# Patient Record
Sex: Male | Born: 1955
Health system: Southern US, Community
[De-identification: ages and names within clinical notes are randomized; demographics above are authoritative.]

## PROBLEM LIST (undated history)

## (undated) DIAGNOSIS — I10 Essential (primary) hypertension: Secondary | ICD-10-CM

## (undated) DIAGNOSIS — F329 Major depressive disorder, single episode, unspecified: Secondary | ICD-10-CM

## (undated) DIAGNOSIS — F419 Anxiety disorder, unspecified: Secondary | ICD-10-CM

## (undated) DIAGNOSIS — Z950 Presence of cardiac pacemaker: Secondary | ICD-10-CM

## (undated) DIAGNOSIS — G43909 Migraine, unspecified, not intractable, without status migrainosus: Secondary | ICD-10-CM

## (undated) DIAGNOSIS — F339 Major depressive disorder, recurrent, unspecified: Secondary | ICD-10-CM

## (undated) DIAGNOSIS — D1801 Hemangioma of skin and subcutaneous tissue: Secondary | ICD-10-CM

## (undated) DIAGNOSIS — G959 Disease of spinal cord, unspecified: Secondary | ICD-10-CM

## (undated) DIAGNOSIS — Z820 Family history of epilepsy and other diseases of the nervous system: Secondary | ICD-10-CM

## (undated) DIAGNOSIS — F32A Depression, unspecified: Secondary | ICD-10-CM

## (undated) DIAGNOSIS — E119 Type 2 diabetes mellitus without complications: Secondary | ICD-10-CM

## (undated) DIAGNOSIS — Z1211 Encounter for screening for malignant neoplasm of colon: Secondary | ICD-10-CM

## (undated) DIAGNOSIS — G894 Chronic pain syndrome: Secondary | ICD-10-CM

## (undated) DIAGNOSIS — M1990 Unspecified osteoarthritis, unspecified site: Secondary | ICD-10-CM

## (undated) DIAGNOSIS — N400 Enlarged prostate without lower urinary tract symptoms: Secondary | ICD-10-CM

## (undated) DIAGNOSIS — Z8669 Personal history of other diseases of the nervous system and sense organs: Secondary | ICD-10-CM

## (undated) DIAGNOSIS — D72829 Elevated white blood cell count, unspecified: Secondary | ICD-10-CM

## (undated) DIAGNOSIS — K219 Gastro-esophageal reflux disease without esophagitis: Secondary | ICD-10-CM

## (undated) DIAGNOSIS — E782 Mixed hyperlipidemia: Secondary | ICD-10-CM

## (undated) DIAGNOSIS — R531 Weakness: Secondary | ICD-10-CM

## (undated) DIAGNOSIS — Z419 Encounter for procedure for purposes other than remedying health state, unspecified: Secondary | ICD-10-CM

## (undated) DIAGNOSIS — Z87891 Personal history of nicotine dependence: Secondary | ICD-10-CM

## (undated) HISTORY — DX: Migraine, unspecified, not intractable, without status migrainosus: G43.909

## (undated) HISTORY — DX: Anxiety disorder, unspecified: F41.9

## (undated) HISTORY — DX: Encounter for procedure for purposes other than remedying health state, unspecified: Z41.9

## (undated) HISTORY — PX: BACK SURGERY: SHX140

## (undated) HISTORY — DX: Benign prostatic hyperplasia without lower urinary tract symptoms: N40.0

## (undated) HISTORY — DX: Personal history of other diseases of the nervous system and sense organs: Z86.69

## (undated) HISTORY — PX: HEMORRHOID SURGERY: SHX153

## (undated) HISTORY — DX: Chronic pain syndrome: G89.4

## (undated) HISTORY — DX: Major depressive disorder, single episode, unspecified: F32.9

## (undated) HISTORY — DX: Weakness: R53.1

## (undated) HISTORY — DX: Type 2 diabetes mellitus without complications: E11.9

## (undated) HISTORY — DX: Essential (primary) hypertension: I10

## (undated) HISTORY — PX: NASAL SINUS SURGERY: SHX719

## (undated) HISTORY — PX: HERNIA REPAIR: SHX51

## (undated) HISTORY — PX: FOOT NEUROMA SURGERY: SHX646

## (undated) HISTORY — DX: Depression, unspecified: F32.A

## (undated) HISTORY — DX: Encounter for screening for malignant neoplasm of colon: Z12.11

## (undated) HISTORY — DX: Elevated white blood cell count, unspecified: D72.829

## (undated) HISTORY — PX: NECK SURGERY: SHX720

## (undated) HISTORY — DX: Major depressive disorder, recurrent, unspecified: F33.9

## (undated) HISTORY — PX: GASTRIC BYPASS: SHX52

---
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
## 2006-11-15 ENCOUNTER — Encounter: Admission: RE | Admit: 2006-11-15 | Discharge: 2006-11-15 | Payer: Self-pay | Admitting: Internal Medicine

## 2007-01-24 ENCOUNTER — Ambulatory Visit (HOSPITAL_COMMUNITY): Admission: RE | Admit: 2007-01-24 | Discharge: 2007-01-25 | Payer: Self-pay | Admitting: Neurosurgery

## 2007-03-27 ENCOUNTER — Encounter: Admission: RE | Admit: 2007-03-27 | Discharge: 2007-03-27 | Payer: Self-pay | Admitting: Specialist

## 2007-04-28 ENCOUNTER — Encounter: Admission: RE | Admit: 2007-04-28 | Discharge: 2007-04-28 | Payer: Self-pay | Admitting: Neurosurgery

## 2007-08-24 ENCOUNTER — Inpatient Hospital Stay (HOSPITAL_COMMUNITY): Admission: RE | Admit: 2007-08-24 | Discharge: 2007-08-28 | Payer: Self-pay | Admitting: Specialist

## 2007-08-25 ENCOUNTER — Ambulatory Visit: Payer: Self-pay | Admitting: Physical Medicine & Rehabilitation

## 2010-04-23 ENCOUNTER — Ambulatory Visit (HOSPITAL_COMMUNITY): Admission: RE | Admit: 2010-04-23 | Discharge: 2010-04-23 | Payer: Self-pay | Admitting: General Surgery

## 2010-04-27 ENCOUNTER — Ambulatory Visit (HOSPITAL_COMMUNITY): Admission: RE | Admit: 2010-04-27 | Discharge: 2010-04-27 | Payer: Self-pay | Admitting: General Surgery

## 2010-06-15 ENCOUNTER — Encounter
Admission: RE | Admit: 2010-06-15 | Discharge: 2010-06-15 | Payer: Self-pay | Source: Home / Self Care | Attending: General Surgery | Admitting: General Surgery

## 2010-08-27 ENCOUNTER — Encounter
Admission: RE | Admit: 2010-08-27 | Discharge: 2010-10-06 | Payer: Self-pay | Source: Home / Self Care | Attending: General Surgery | Admitting: General Surgery

## 2010-09-06 HISTORY — PX: GASTRIC BYPASS: SHX52

## 2010-09-10 LAB — CBC
HCT: 42.9 % (ref 39.0–52.0)
Hemoglobin: 15 g/dL (ref 13.0–17.0)
MCH: 31.7 pg (ref 26.0–34.0)
MCHC: 35 g/dL (ref 30.0–36.0)
MCV: 90.7 fL (ref 78.0–100.0)
Platelets: 180 10*3/uL (ref 150–400)
RBC: 4.73 MIL/uL (ref 4.22–5.81)
RDW: 12.4 % (ref 11.5–15.5)
WBC: 5.8 10*3/uL (ref 4.0–10.5)

## 2010-09-10 LAB — COMPREHENSIVE METABOLIC PANEL
ALT: 47 U/L (ref 0–53)
AST: 38 U/L — ABNORMAL HIGH (ref 0–37)
Albumin: 4.3 g/dL (ref 3.5–5.2)
Alkaline Phosphatase: 55 U/L (ref 39–117)
BUN: 12 mg/dL (ref 6–23)
CO2: 25 mEq/L (ref 19–32)
Calcium: 9.7 mg/dL (ref 8.4–10.5)
Chloride: 107 mEq/L (ref 96–112)
Creatinine, Ser: 1.17 mg/dL (ref 0.4–1.5)
GFR calc Af Amer: >60 mL/min (ref >60)
GFR calc non Af Amer: >60 mL/min (ref >60)
Glucose, Bld: 112 mg/dL — ABNORMAL HIGH (ref 70–99)
Potassium: 3.7 mEq/L (ref 3.5–5.1)
Sodium: 141 mEq/L (ref 135–145)
Total Bilirubin: 0.7 mg/dL (ref 0.3–1.2)
Total Protein: 7.3 g/dL (ref 6.0–8.3)

## 2010-09-10 LAB — DIFFERENTIAL
Basophils Absolute: 0.1 10*3/uL (ref 0.0–0.1)
Basophils Relative: 1 % (ref 0–1)
Eosinophils Absolute: 0.2 10*3/uL (ref 0.0–0.7)
Eosinophils Relative: 3 % (ref 0–5)
Lymphocytes Relative: 41 % (ref 12–46)
Lymphs Abs: 2.4 10*3/uL (ref 0.7–4.0)
Monocytes Absolute: 0.4 10*3/uL (ref 0.1–1.0)
Monocytes Relative: 7 % (ref 3–12)
Neutro Abs: 2.7 10*3/uL (ref 1.7–7.7)
Neutrophils Relative %: 47 % (ref 43–77)

## 2010-09-14 ENCOUNTER — Inpatient Hospital Stay (HOSPITAL_COMMUNITY)
Admission: RE | Admit: 2010-09-14 | Discharge: 2010-09-17 | Payer: Self-pay | Source: Home / Self Care | Attending: General Surgery | Admitting: General Surgery

## 2010-09-15 ENCOUNTER — Encounter (INDEPENDENT_AMBULATORY_CARE_PROVIDER_SITE_OTHER): Payer: Self-pay | Admitting: General Surgery

## 2010-09-21 LAB — CBC
HCT: 41.4 % (ref 39.0–52.0)
HCT: 39.7 % (ref 39.0–52.0)
HCT: 37.5 % — ABNORMAL LOW (ref 39.0–52.0)
Hemoglobin: 14.2 g/dL (ref 13.0–17.0)
Hemoglobin: 14 g/dL (ref 13.0–17.0)
Hemoglobin: 12.7 g/dL — ABNORMAL LOW (ref 13.0–17.0)
MCH: 31.6 pg (ref 26.0–34.0)
MCH: 32.1 pg (ref 26.0–34.0)
MCH: 30.8 pg (ref 26.0–34.0)
MCHC: 34.3 g/dL (ref 30.0–36.0)
MCHC: 35.3 g/dL (ref 30.0–36.0)
MCHC: 33.9 g/dL (ref 30.0–36.0)
MCV: 92 fL (ref 78.0–100.0)
MCV: 91.1 fL (ref 78.0–100.0)
MCV: 91 fL (ref 78.0–100.0)
Platelets: 143 10*3/uL — ABNORMAL LOW (ref 150–400)
Platelets: 135 10*3/uL — ABNORMAL LOW (ref 150–400)
Platelets: 140 10*3/uL — ABNORMAL LOW (ref 150–400)
RBC: 4.5 MIL/uL (ref 4.22–5.81)
RBC: 4.36 MIL/uL (ref 4.22–5.81)
RBC: 4.12 MIL/uL — ABNORMAL LOW (ref 4.22–5.81)
RDW: 12.3 % (ref 11.5–15.5)
RDW: 12.2 % (ref 11.5–15.5)
RDW: 12.3 % (ref 11.5–15.5)
WBC: 9.8 10*3/uL (ref 4.0–10.5)
WBC: 8.7 10*3/uL (ref 4.0–10.5)
WBC: 7 10*3/uL (ref 4.0–10.5)

## 2010-09-21 LAB — DIFFERENTIAL
Basophils Absolute: 0 10*3/uL (ref 0.0–0.1)
Basophils Absolute: 0 10*3/uL (ref 0.0–0.1)
Basophils Relative: 0 % (ref 0–1)
Basophils Relative: 0 % (ref 0–1)
Eosinophils Absolute: 0 10*3/uL (ref 0.0–0.7)
Eosinophils Absolute: 0.1 10*3/uL (ref 0.0–0.7)
Eosinophils Relative: 0 % (ref 0–5)
Eosinophils Relative: 1 % (ref 0–5)
Lymphocytes Relative: 17 % (ref 12–46)
Lymphocytes Relative: 24 % (ref 12–46)
Lymphs Abs: 1.7 10*3/uL (ref 0.7–4.0)
Lymphs Abs: 2.1 10*3/uL (ref 0.7–4.0)
Monocytes Absolute: 0.9 10*3/uL (ref 0.1–1.0)
Monocytes Absolute: 0.9 10*3/uL (ref 0.1–1.0)
Monocytes Relative: 10 % (ref 3–12)
Monocytes Relative: 11 % (ref 3–12)
Neutro Abs: 7.1 10*3/uL (ref 1.7–7.7)
Neutro Abs: 5.6 10*3/uL (ref 1.7–7.7)
Neutrophils Relative %: 73 % (ref 43–77)
Neutrophils Relative %: 64 % (ref 43–77)

## 2010-09-21 LAB — GLUCOSE, CAPILLARY
Glucose-Capillary: 101 mg/dL — ABNORMAL HIGH (ref 70–99)
Glucose-Capillary: 127 mg/dL — ABNORMAL HIGH (ref 70–99)
Glucose-Capillary: 137 mg/dL — ABNORMAL HIGH (ref 70–99)
Glucose-Capillary: 138 mg/dL — ABNORMAL HIGH (ref 70–99)
Glucose-Capillary: 148 mg/dL — ABNORMAL HIGH (ref 70–99)
Glucose-Capillary: 158 mg/dL — ABNORMAL HIGH (ref 70–99)
Glucose-Capillary: 200 mg/dL — ABNORMAL HIGH (ref 70–99)
Glucose-Capillary: 211 mg/dL — ABNORMAL HIGH (ref 70–99)
Glucose-Capillary: 238 mg/dL — ABNORMAL HIGH (ref 70–99)
Glucose-Capillary: 129 mg/dL — ABNORMAL HIGH (ref 70–99)
Glucose-Capillary: 133 mg/dL — ABNORMAL HIGH (ref 70–99)
Glucose-Capillary: 133 mg/dL — ABNORMAL HIGH (ref 70–99)
Glucose-Capillary: 144 mg/dL — ABNORMAL HIGH (ref 70–99)
Glucose-Capillary: 144 mg/dL — ABNORMAL HIGH (ref 70–99)
Glucose-Capillary: 146 mg/dL — ABNORMAL HIGH (ref 70–99)
Glucose-Capillary: 156 mg/dL — ABNORMAL HIGH (ref 70–99)
Glucose-Capillary: 133 mg/dL — ABNORMAL HIGH (ref 70–99)
Glucose-Capillary: 136 mg/dL — ABNORMAL HIGH (ref 70–99)
Glucose-Capillary: 143 mg/dL — ABNORMAL HIGH (ref 70–99)
Glucose-Capillary: 159 mg/dL — ABNORMAL HIGH (ref 70–99)
Glucose-Capillary: 165 mg/dL — ABNORMAL HIGH (ref 70–99)

## 2010-09-21 LAB — HEMOGLOBIN AND HEMATOCRIT, BLOOD
HCT: 39.9 % (ref 39.0–52.0)
HCT: 43.6 % (ref 39.0–52.0)
Hemoglobin: 14 g/dL (ref 13.0–17.0)
Hemoglobin: 15.1 g/dL (ref 13.0–17.0)

## 2010-09-28 ENCOUNTER — Encounter
Admission: RE | Admit: 2010-09-28 | Discharge: 2010-10-06 | Payer: Self-pay | Source: Home / Self Care | Attending: General Surgery | Admitting: General Surgery

## 2010-09-29 NOTE — Discharge Summary (Signed)
  NAME:  Antonio Oliver, Antonio Oliver               ACCOUNT NO.:  [id number]  MEDICAL RECORD NO.:  [id number]          PATIENT TYPE:  INP  LOCATION:  1525                         FACILITY:  [facility]  PHYSICIAN:  Sharlet Salina T. Lenin Kuhnle, M.D.DATE OF BIRTH:  [birth year]  DATE OF ADMISSION:  09/14/2010 DATE OF DISCHARGE:  09/17/2010                              DISCHARGE SUMMARY   DISCHARGE DIAGNOSES: 1. Morbid obesity. 2. Insulin-dependent diabetes mellitus. 3. Hypertension. 4. Obstructive sleep apnea. 5. Hypothyroidism. 6. Depression. 7. Umbilical hernia.  OPERATIONS AND PROCEDURES:  Laparoscopic Roux-en-Y gastric bypass with repair of an umbilical hernia on September 14, 2010.  SURGEON:  Lorne Skeens. Shriyan Arakawa, M.D.  HISTORY OF PRESENT ILLNESS:  The patient is a 55 year old male with longstanding progressive morbid obesity unresponsive to medical management.  He presented with a BMI of 35.5 with multiple comorbidities including insulin-dependent diabetes mellitus, hypertension, sleep apnea, elevated cholesterol, chronic arthritis and GERD.  The patient has undergone extensive preoperative evaluation as an outpatient and is admitted electively for laparoscopic Roux-en-Y gastric bypass.  He also has a moderate-sized symptomatic umbilical hernia which we repaired at the same time.  PAST SURGICAL HISTORY:  The patient has had previous surgery including inguinal hernia repair in 1980, multiple back surgeries with fusion and surgical disk surgery.  PAST MEDICAL HISTORY:  Medically followed for insulin-dependent diabetes mellitus, history of cardiac spasms without MI or coronary artery disease, hypertension, hypothyroidism, severe depression with numerous ECT sessions.  He has sleep apnea requiring CPAP, hypercholesterolemia, GERD, insomnia, BPH.  MEDICATIONS:  The patient is on NPH and regular insulin.  He is on 37 medications for illnesses above.  Please see separate  medication reconciliation.  ALLERGIES:  NO KNOWN DRUG ALLERGIES.  PHYSICAL EXAMINATION:  He is obese.  The abdomen is nontender.  There is a several-centimeter umbilical hernia.  HOSPITAL COURSE:  The patient was admitted on the morning of his procedure and underwent an uneventful laparoscopic Roux-en-Y gastric bypass and repair of his umbilical hernia primarily without mesh.  His postoperative course was smooth.  Upper GI series showed no leak or obstruction on the first postoperative day.  His white count and hemoglobin were normal.  He was begun on a liquid diet and able to be advanced to protein shakes without difficulty.  CBC remained normal on the second postoperative day.  He did, however, have one spike of fever to 102.5 on the second postoperative morning.  Exam was unremarkable and this was observed and felt secondary to atelectasis.  He remained afebrile following this and continued to feel well and was tolerating his diet.  He was discharged home on the third postoperative day. Wounds healing primarily.  Abdomen nontender.  DISCHARGE MEDICATIONS:  Discharge medications are the same as admission with markedly reduced insulin requirements.  FOLLOWUP:  Followup will be my office in one week.     Lorne Skeens. Hayward Rylander, M.D.     Tory Emerald  D:  09/25/2010  T:  09/25/2010  Job:  [id number]  Electronically Signed by Glenna Fellows M.D. on 09/29/2010 09:22:29 AM

## 2010-10-01 LAB — SURGICAL PCR SCREEN: Staphylococcus aureus: POSITIVE — AB

## 2010-11-05 ENCOUNTER — Encounter: Payer: Medicare Other | Attending: General Surgery | Admitting: *Deleted

## 2010-11-05 DIAGNOSIS — Z713 Dietary counseling and surveillance: Secondary | ICD-10-CM | POA: Insufficient documentation

## 2010-11-05 DIAGNOSIS — E669 Obesity, unspecified: Secondary | ICD-10-CM | POA: Insufficient documentation

## 2011-01-19 NOTE — Op Note (Signed)
NAME:  Antonio Oliver, Antonio Oliver NO.:  [id number]   MEDICAL RECORD NO.:  [id number]          PATIENT TYPE:  INP   LOCATION:  2899                         FACILITY:  [facility]   PHYSICIAN:  Kathaleen Maser. Pool, M.D.    DATE OF BIRTH:  1955-09-13   DATE OF PROCEDURE:  01/24/2007  DATE OF DISCHARGE:                               OPERATIVE REPORT   PREOPERATIVE DIAGNOSIS:  Right L4-5 herniated pulposus with  radiculopathy.   POSTOPERATIVE DIAGNOSIS:  Right L4-5 herniated pulposus with  radiculopathy.   PROCEDURE NAME:  Right L4-5 laminotomy and microdiskectomy.   SURGEON:  Kathaleen Maser. Pool, M.D.   ASSISTANT:  Reinaldo Meeker, M.D.   ANESTHESIA:  General orotracheal anesthesia.   INDICATIONS:  Mr. Kiss is a 55 year old male who has a remote history  of a left-sided L4-5 laminotomy and diskectomy, presents now with severe  back and right lower extremity pain, paresthesias and weakness of the  right-sided L5 radiculopathy.  Workup demonstrates evidence of a right  sided L4-5 disk herniation with compression of thecal sac and right side  L5 nerve root.  The patient counseled as to options.  He decided proceed  with a right-sided L4-5 laminotomy microdiskectomy in hopes of  improving his symptoms.   OPERATIVE NOTE:  Patient to operating room, placed on table in the  supine position.  After adequate anesthesia achieved the patient prone  onto Wilson frame, appropriately padded.  The patient lumbar region was  prepped draped sterilely.  10 blade made linear incision overlying the  L4-5 interspace.  This carried down sharply in the midline.  Subperiosteal dissection performed exposing lamina facet joints L4 and  L5 on the right side.  Deep self-retaining retractors placed.  Intraoperative x-rays taken level was confirmed.  Laminotomy was then  performed using high-speed drill and Kerrison rongeurs remove inferior  aspect lamina of L4, medial aspect of L4-5 facet joint, superior rim  of  L5 lamina.  Ligament flavum was then elevated and resected piecemeal  fashion using Kerrison rongeurs.  Underlying thecal sac and exiting L5  nerve were identified.  Microscope was then brought to field, used for  microdissection of the right-sided L5 nerve root underlying disk.  Epidural venous plexus coagulated cut.  Thecal sac and L5 nerve were  mobilized retracted towards midline.  Disk herniation was then  identified.  This was incised 15 blade in rectangular fashion.  A wide  disk space clean-out was then achieved using pituitary rongeurs, upward  angled rongeurs and Epstein curettes.  All elements the disk herniation  completely resected.  All loose or obviously degenerative disk material  was removed from the interspace.  After very thorough diskectomy was  achieved, the canal was inspected.  This found to be free of any  residual compressive masses.  Wound was then inspected one final time.  There is no evidence of injury to thecal sac and nerve roots.  We then  irrigated with antibiotic solution.  Gelfoam was placed topically for  hemostasis, found to be good.  Microscope and retractor system were  removed.  Hemostasis of muscles  achieved electrocautery.  Wound was closed in  layers Vicryl sutures.  Steri-Strips  sterile dressings were applied.  No complications.  The patient tolerated procedure well and he returns  recovery room postoperative.           ______________________________  Kathaleen Maser. Pool, M.D.     HAP/MEDQ  D:  01/24/2007  T:  01/24/2007  Job:  [id number]

## 2011-01-22 NOTE — Op Note (Signed)
NAMECOLTON, TASSIN               ACCOUNT NO.:  [id number]   MEDICAL RECORD NO.:  [id number]          PATIENT TYPE:  INP   LOCATION:  5016                         FACILITY:  [facility]   PHYSICIAN:  Kerrin Champagne, M.D.   DATE OF BIRTH:  05-21-1956   DATE OF PROCEDURE:  08/29/2007  DATE OF DISCHARGE:  08/28/2007                               OPERATIVE REPORT   PREOPERATIVE DIAGNOSIS:  Right foraminal entrapment L4-5 and L5-S1 with  severe degenerative disk disease L4-L5 and L5-S1 status post previous  microdiskectomy at the L4-5 and L5-S1 levels with recurring nerve root  entrapment, small disk protrusion.  Modic changes seen on MRI study.   POSTOPERATIVE DIAGNOSIS:  Right foraminal entrapment L4-5 and L5-S1 with  severe degenerative disk disease L4-L5 and L5-S1 status post previous  microdiskectomy at the L4-5 and L5-S1 levels with recurring nerve root  entrapment, small disk protrusion.  Modic changes seen on MRI study.  Severe foraminal entrapment affecting the right L4 and right L5 nerve  roots.  L5 in particular entrapped by the inferior articular process of  L5 above the disk space L5-S1.   PROCEDURE:  Right-sided L4-5 and L5-S1 transforaminal lumbar interbody  fusion, right-sided L5 hemilaminectomy for decompression of the L5 nerve  root within the entry point to the L5 neural foramen and within the  neural foramen itself.  Right-sided transforaminal lumbar interbody  fusion was performed using DePuy Monarch Concorde cages 8 mm at the L4-5  level lordotic and 9 mm at the L5-S1 level lordotic.  Combination of  local bone graft within the T-lift site and posterior lateral fusion L4-  5 and L5-S1 using combination of V-toss and local bone graft.  The bone  marrow aspirate via the pedicles on the left side at L4 and L5.  For  application of V-Toss.  Pedicle screw and rod fixation L3-4 and L4-5  total three segments to intervertebral disk spaces.  Using Monarch  pedicle screws and  rods 6.25 mm screws at L4-L5, 7-mm screws at the S1  level.   SURGEON:  Dr. Vira Browns.   ASSISTANCE:  Maud Deed, PAC.   SURGEON:  Burna Forts, M.D.   ANESTHESIA:  General via oral tracheal intubation and local infiltration  of Marcaine 0.50%, 1:200,000 epinephrine 20 mL.   The patient received intraoperative neuro monitoring, received Cell  Saver blood total 600 mL.   ESTIMATED BLOOD LOSS:  Was 1100 mL for the case.   HISTORY OF PRESENT ILLNESS:  The patient is a 55 year old male who  developed severe back pain, radiation to his right leg nearly a year and  a half ago.  It was evaluated and felt to have findings consistent with  degenerative disk disease of disk protrusion and severe foraminal  entrapment right side at L4-5 and L5-S1 due to congenital spinal  stenosis and degenerative disk changes.  He underwent surgical treatment  in the form of microdiskectomy on the right side at L4-5, L5-S1.  However, he persisted with severe pain with neurogenic claudication,  difficulty with standing and ambulation.  He also noted pain with  bending or stooping, or lifting.  Severe leg pain at the L5 distribution  as well as L4.  The patient underwent attempts at conservative  management including ESI which were unsuccessful.  He is in pain  management, unable to return to work following his surgical procedures.  Intraoperative findings as above.   DESCRIPTION OF PROCEDURE:  After adequate general anesthesia the patient  had Foley catheter placed.  Standard preoperative antibiotics of Ancef.  He was rolled to a prone position.  The [facility] spine table was used, a  standard prep with DuraPrep solution.  All pressure points well-padded.  Intraoperative neuro monitoring was used.  The incision ellipse in the  old incision scar in the midline extending from about L2-S2.  Incision  carried through skin and subcu layers down to the spinous process to L3,  L4-L5, S1-S2.  Incision  carried along the lateral aspect spinous process  of L2-L3, L4-L5, S1-S2 and carried out laterally exposing the sacrum.  The lumbosacral junction bilaterally, excising dissect capsules at the  lumbosacral junction, continuing to the L4-5 level.  Clamps placed on  the spinous processes of L4 and L5 and intraoperative radiograph  demonstrated these to be L4 and L5.  These were marked for continued  identification.  Facet capsule was also identified at the L3-4 level.  This was preserved.  Dissection carried out laterally exposing the  transverse process of L4 at the L3-4 level bilaterally.  The incision  was carried out to the lateral gutters extending up to L2-3 further to  allow for lateral exposure enough to allow for pedicle screw placement  at the L4-L5 level and S1 levels with correct degree of convergence.  Transverse process at L4, L5-S1 sacral ala were completely exposed  bilaterally then packed with sponges after obtaining hemostasis with  bipolar electrocautery as well as regular monopolar electrocautery.   Attention then turned to performance of facetectomies right-sided L4-5,  L5-S1.  Osteotomes were used to resect the inferior articular process of  L4 on the right side of the L4-5 level.  The inferior articular process  of L5, L5-S1 level was provided excellent bone graft and abundant amount  of bone graft material.  Superior articular process of S1 was resected  as well using osteotomes as well as at the L5 levels superior articular  process was resected.  This decompressed the neural foramen.  The L4-5,  L5-S1 the ligament of flavum was resected and the foraminotomy performed  of the S1 nerve root lateral recess decompressed using osteotome then 3  mm and 4 mm Kerrisons used to decompressed the lateral recess, removed  bone overhang of the superior aspect of the S1 pedicle, further  decompressing the neural foramen on the right side, obtaining exposure  of the right L5-S1  disk space.  At the L4-5 level, similarly, the L5  superior reticular process resected using osteotome and 3-mm Kerrisons.  Reflected portions of the ligamentum flavum were resected completely  decompressing the neural foramen of the L4 nerve root and of the  overlying reflected ligament flavum was resected out laterally.  Hockey  stick neural probe could be passed out over the L4 neural foramen  without difficulty demonstrating its patency.  All residual pars was  resected so that the entry point was completely well decompressed.  Hockey stick neural probe could be passed out up the lateral recess to  the L3-4 level without difficulty and out the L3-L4 neural foramen  demonstrating its patency as well.  Attention then turned to performance of pedicle screw fixation, first on  the side entry point for the L4 pedicle was made using an awl over the  lateral aspect of the pedicle of L4 at the intersection of the  transverse process of L4 with superior articular process of L4 and the  lateral portion of the pedicle.  After making initial entry point a  straight pedicle finder was used to probe the pedicle ball-tipped probe  used to check the pedicle opening to make sure that there was no  broaching and cortex was present.  C-arm fluoro was used to carefully  evaluate the position alignment for the awl placement as well as pedicle  probing.  Screws measuring about 45 mm in length then by 6.25 were then  placed on the left side at the L4 level.  Note that aspiration was first  carried out using a blunt tipped aspiration equipment through the  opening in the pedicle left L4 aspirating 10 mL.  This was applied to  the talus.   Next, tapping was performed in the left-side of L4 and the appropriate  screw then placed.  Then at the L5 level similarly an awl was used to  make an entry point of the lateral aspect of pedicle L5, correct degree  of convergence and a straight pedicle finder used to  probe the pedicle  of L5.  Ball-tipped probe used to ensure patency of the pedicle opening  and no further sign of broaching of pedicle was present.  Tapping  performed using a 5.5 tap and 6.25 x 45 mm screw was placed in the L5  level on the left side following decortication of the transverse process  at this level as well as L4 and application of V-Toss bone graft.   Attention then turned to the S1 level on the left side where again an  opening was made.  This was distal lateral to the superior articular  process of S1 at its inferior portion and directed angle and correct  degree of convergence and lordosis.  Straight pedicle finder used to  probe the pedicle and then a 40 mm x 7.0 screw was used at this point to  correct a degree of convergence after tapping with a 6.25 tap  decorticating the lateral ala placing V-Toss bone graft, then the  pedicle screw was placed at the S1 level on the left side.   Attention then turned to the right side where pedicle screws were  similarly placed on the right side, again using an awl for entry point  at the L4 level, the intersection of transverse process of L4 at the  lateral aspect of pedicle of L4 and screw articular process.  The L4  observing on C-arm fluoro with correct position and alignment.  Then  using a straight pedicle finder and probe the pedicle to approximately  45 mm by checking the entry point as well as the opening of the pedicle  to ensure its patency and no sign of broaching of cortex.  Tapping was a  5.5 tap, a 6.25 x 45 mm screw was placed on the right side without  difficulty.  Decortication was carried out over the transverse process  and local bone graft with V-Toss applied.   Attention turned to the L5 pedicle where again awl was used to make an  initial entry point at the intersection of the transverse process of L5  with the lateral aspect of the pedicle of L5.  Using a straight pedicle  finder then the pedicle was  probed.  The patency of the pedicle was then  tested using ball-tipped probe to ensure no sign of broaching cortex.  None was present.  Tapping was performed using a 5.5 tap and a 45-mm x  6.25 screw was then placed after decortication of the transverse  process, application of V-Toss and local bone graft.   Finally, on the right side of S1, initial entry point was made using the  awl at the lower aspect of the lateral aspect of the facet of S1 and  again a straight pedicle finder used to probe the pedicle to a depth of  40 mm.  A 7.0 x 40-mm screw was chosen, tapping with the 6.25 tap,  decorticating the sacral ala laterally applying bone graft out laterally  and screw was applied without difficulty.   Intraoperative C-arm fluoro demonstrated all six screws to be in good  position and alignment.  Pedicle screw testing was carried out.  Each of  the screws tested better than 30 at all levels with the exception of L5  on the left.  Observing this pedicle screw on AP and lateral views it  was felt to be in excellent position and alignment with no sign of  broaching of cortex.  Therefore, no attempt was made to remove the  screws.   With this and T-lift was then begun on the right side versus the L4-5  level, retracting the thecal sac at this level and the L4 nerve root  superiorly.  The entry point the distal posterior lateral corner was  then observed.  Bipolar electrocautery used to control the veins here  and epidural vein bleeding.  The disk space entered using a 15 blade  scalpel, removing a window of disk material.  Kerrisons used to further  debride the annular material opening this area.  Dilatation of the disk  space carried out with a 7 mm, then 8 mm then 9 mm spreader.  This was  completed then the space was debrided using regular tooth pituitary  rongeurs, straight and upbiting, debriding the degenerative disk  material.   The DePuy pituitaries were then used at this point.   Curettage carried  out of the disk space removing any residual degenerative disk material  as well as cartilaginous endplates over the inferior aspect of L4  superior aspect of L5.  Both the straight and upbiting right upbiting  left curettes were used and ring curettes down to bleeding subchondral  bone surfaces.   With this completed then trial with the trial insert was performed first  with a 7 mm then an 8 mm.  The 8 mm provided tight fit and it was felt  that this was the only particular size that would be able to be inserted  at this level due to degeneration loss and disk space height.  This  being the case an 8-mm cage was then packed with local bone graft  harvested from the bone removed from the pedicles and the facets on the  right side during their removal of the facets.  This was morselized and  place within the cage.  Additional bone graft was placed within the  intervertebral disk space and finally the 8-mm cage and the trajectory  of about 35 degrees of convergence was impacted in the disk space and  provided excellent distraction and excellent fixation of the disk space.  Care was taken to ensure that no bone  fragments were retropulsed into  the spinal canal.  The L4 neural foramen remained widely patent as did  the lateral recess at L4-5.  Entry points at the L5 neural foramen.   Attention then turned to the L5-S1 level on the right side where the  disk space was then similarly exposed, retracted thecal sac medially.  The S1 nerve root and the L5 nerve root superiorly disk space and  entered using an 15 blade scalpel to excise disk material and make a  window into the disk space on this side.  Pituitary rongeurs used to  debride the disk material and then DePuy straight upbitings were used to  further debride disk material from the disk space.   Curettage then carried out over the endplates as well as removing  further degenerative disk material curetting  cartilaginous endplates  down to bleeding bone surfaces.  Straight upbiting right upbiting left  curettes were used then ring curettes, straight and upbiting.  With this  completed then the endplates appeared to be primarily bleeding bone.  No  sign of residual cartilage, end plates or residual degenerative disk  here.  Dilatation of the disk space was carried out 8, 9 and 10 mm.  A 9-mm  cage was chosen lordotic cage.  This was done after trialing with a 7,  8, and then 9 mm.  The 9 mm provided excellent fit to a 9-mm lordotic  cage chosen.  This 9 mL cage was packed with local bone graft then  harvested from previous facet resection on the right side.   Additional bone graft was placed within the intervertebral disk space  with the opening on the right side and then the cage with the correct  trajectory was impacted into place with protectery about 35 degrees  angulation and convergence.  Subset well beneath the posterior aspect of  the disk space at L5-S1 as was the L4-5 cage.  Care was taken to ensure  that these were seated centrally and anteriorly within the disk space  and with correct degree of convergence with lordosis of both levels.   This completed the T-lift procedure.  Careful exploration of the neural  foramen at the L5 level demonstrated persistence of bone superior to the  disk space and appeared to cause compression of the L5 neural foramen.  This being the case the patient's neural foramen was further  decompressed by first performing resection of the pars and the residual  portion of the laminectomy of lamina of L5 on the right side was  continued up to the L4-5 level, performing a formal hemilaminectomy.  Then finding L5 nerve root within the lateral recess at L4-5 and then  tracking it out to the neural foramen within the entry point to the  neural foramen, decompressing and removing the residual lamina and pars  overlying the L5 nerve root until the L5 nerve root  was completely  decompressed exiting out the L5 neural foramen above the disk space.   Hypertrophic bone changes as well as reflected ligamentum flavum appear  to represent the major cause of nerve root compression on this site well  within the neural foramen out laterally.   When decompression was completed then a hockey stick neural probe could  be passed both ventral as well as dorsal to the L5 nerve root with no  sign of further nerve compression.  Decompression and T-lift portion of  procedure was felt to be adequate at this point.  Rods were then  carefully contoured to be placed into the fasteners from L4 to sacrum  bilaterally and carefully placed.   First the fasteners at the L4 level were attached to the rod and torqued  to 80 foot-pounds.  Compression then obtained between L4 and L5 on the  right side at the side of the T-lift and the L5 fastener was then  attached to the rod at 80 foot-pounds.  Then on the left side similarly  the L4 fastener attached to the rod 80 foot-pounds then compression  obtained between the fastener of L4-L5 and the fastener of L5 was  tightened to 80 foot pounds.   Returning to the right side then compression obtained between the L5 and  S1 fasteners and the S1 fastener was then attached to the rod 80 foot-  pounds.  Then on the left side compression between L5-S1 was obtained  and the S1 fastener tightened to 80 foot-pounds.   Following this then again examination was made in the neural foramen at  both L4-L5 lateral recess demonstrating this remained well decompressed.  Following the obtaining of lordosis across the cages at the L4-5, L5-S1  level.  Residual bone material was then placed posterior laterally as  well as any V-Toss material placed at this time.   Irrigation was carried out.  A Hemovac drain placed on the right side  exiting out the right lower lumbar spine and this was then placed along  the right posterior aspect of the lamina  of the incision.  Lumbodorsal  fascia then reapproximated to the interspinous ligaments and  supraspinous ligaments extending from L2-L3 and then at S1-S2.   The spinous processes at L4-L5 also had placement and reattachment of  the paralumbar fascia at this level.   The deep subcu layers approximated with interrupted number 1 and 0  Vicryl sutures more superficial layers with interrupted 2-0 Vicryl  sutures and skin closed with a running subcu stitch of 4-0 Vicryl.  Dermabond was then applied, 4x4s fixed to the skin with Hypafix tape.  Permanent C-arm images were obtained in AP and lateral planes  demonstrating the pedicle screw and rod fixation with good position  alignment.  Presence of T-lifts well within the disk space in good  position alignment.  The patient was then returned to supine position,  reactivated, extubated, returned to the Recovery Room in satisfactory  condition.  All instruments and sponge counts were correct.      Kerrin Champagne, M.D.  Electronically Signed     JEN/MEDQ  D:  08/29/2007  T:  08/30/2007  Job:  [id number]

## 2011-01-22 NOTE — Discharge Summary (Signed)
Antonio Oliver, ROCHFORD               ACCOUNT NO.:  [id number]   MEDICAL RECORD NO.:  [id number]          PATIENT TYPE:  INP   LOCATION:  5016                         FACILITY:  [facility]   PHYSICIAN:  Kerrin Champagne, M.D.   DATE OF BIRTH:  [birth year]   DATE OF ADMISSION:  08/24/2007  DATE OF DISCHARGE:  08/28/2007                               DISCHARGE SUMMARY   ADMISSION DIAGNOSES:  1. Right foraminal entrapment L4-5 and L5-S1 with severe degenerative      disk disease L4-5 and L5-S1, status post previous microdiskectomy      L4-5 and L5-S1 levels with recurring nerve root entrapment and      small disk protrusion.  Modic changes on the MRI study of the      lumbar spine.  2. Gastroesophageal reflux disease.  3. Anxiety.  4. Depression  5. Noninsulin dependent diabetes mellitus.  6. Hypertension.  7. History of migraine headaches.  8. Sleep apnea treated with CPAP.  9. Benign prostatic hypertrophy.  10.Hypothyroidism.  11.Status post urethral dilatation.  12.Status post cervical spine fusion.   DISCHARGE DIAGNOSES:  1. Right foraminal entrapment L4-5 and L5-S1 with severe degenerative      disk disease L4-5 and L5-S1, status post previous microdiskectomy      L4-5 and L5-S1 levels with recurring nerve root entrapment and      small disk protrusion.  Modic changes on the MRI study of the      lumbar spine.  2. Gastroesophageal reflux disease.  3. Anxiety.  4. Depression  5. Noninsulin dependent diabetes mellitus.  6. Hypertension.  7. History of migraine headaches.  8. Sleep apnea treated with CPAP.  9. Benign prostatic hypertrophy.  10.Hypothyroidism.  11.Status post urethral dilatation.  12.Status post cervical spine fusion.  13.Posthemorrhagic anemia.   PROCEDURE:  On August 24, 2007, the patient underwent right L4-5 and  L5-S1 transforaminal lumbar interbody fusion, right-sided transforaminal  lumbar interbody fusion L5-S1 level.  Posterolateral fusion L4-5 and L5-  S1 with pedicle screw and rod fixation local bone graft and detox.  This  was performed by Dr. Otelia Sergeant, assisted by Maud Deed, P.A.-C., under  general anesthesia.   CONSULTATIONS:  Physical medicine and rehabilitation, Dr. Riley Kill.   BRIEF HISTORY:  Patient is a 55 year old male with severe back pain with  radiation into his right lower extremity for greater than 1 year.  Radiographic findings have been consistent with degenerative disk  disease.  MRI showing disk protrusion and severe foraminal entrapment  right L4-5 and L5-S1 due to congenital spinal stenosis and degenerative  disk changes.  He has had previous microdiskectomy at the right on L4-5  and L5-S1.  However, his pain is persistent and recently has become  severe with neurogenic claudication symptoms with standing and  ambulation.  Conservative management including epidural steroid  injections and pain management have been unsuccessful in treating his  symptoms.  It was felt he would benefit from surgical intervention and  was admitted for the procedure as stated above.   BRIEF HOSPITAL COURSE:  The patient tolerated the procedure under  general anesthesia  without complications.  His sleep apnea was treated  with CPAP via the respiratory therapist during the hospital stay.  Neurovascular motor function of the lower extremities was noted to be  intact postoperatively.  Physical therapy was initiated.  The patient  utilized an Therapist, nutritional which was provided through [employer].  Prior to discharge, he was ambulating 200 feet.  Initially, he had  difficulty with pain control, and a rehab consult was obtained.  It was  felt that once his pain was improved he would be able to ambulate better  and be independent enough for discharge to his home.  He received  occupational therapy for ADLs and tolerated this well.  Diet was  advanced when bowel function returned.  He had posthemorrhagic anemia  that did not require blood  transfusion as he was asymptomatic.  Hemoglobin and hematocrit 10.4 and 30.2 at discharge.  Chemistry studies  throughout the hospital stay remained essentially normal with exception  of elevation in blood sugar to as high as 157.  Urinalysis on admission  was negative for urinary tract infection.  Hemoglobin A1c was 5.4.  The  patient received daily dressing changes during the hospital stay.  Wound  was found to be healing without drainage, erythema or edema.  The  patient was able to void after Foley catheter discontinued.  Eventually,  he was able to be discharged to his home in stable condition.   PLAN:  The patient was discharged to his home.  He was given medication  reconciliation form indicating he should continue with her home  medications as taken prior to admission with the exception of Relafen.  He was given prescriptions for OxyContin 20 mg 1 every 8 hours and  oxycodone 5 mg 1 every 4 to 6 hours as needed for breakthrough pain.  The patient will follow up with Dr. Otelia Sergeant 2 weeks from the date of  surgery.  He will call to arrange the appointment.  He will wear his  brace at all times when out of bed.  Dressing change will be done daily  or as needed.  He will be allowed to shower.  The patient was provided  with the necessary durable medical equipment at discharge.  He will  resume a diabetic diet at home.   CONDITION ON DISCHARGE:  Stable.      Wende Neighbors, P.A.      Kerrin Champagne, M.D.  Electronically Signed    SMV/MEDQ  D:  10/12/2007  T:  10/13/2007  Job:  [id number]

## 2011-02-08 ENCOUNTER — Ambulatory Visit: Payer: BC Managed Care – PPO | Admitting: *Deleted

## 2011-02-12 ENCOUNTER — Ambulatory Visit: Payer: BC Managed Care – PPO | Admitting: *Deleted

## 2011-04-05 DIAGNOSIS — Z9884 Bariatric surgery status: Secondary | ICD-10-CM

## 2011-04-05 HISTORY — DX: Bariatric surgery status: Z98.84

## 2011-06-11 LAB — CBC
HCT: 43.8
Hemoglobin: 15
MCHC: 34.2
MCV: 91.4
Platelets: 258
RBC: 4.79
RDW: 12.1
WBC: 6.8

## 2011-06-11 LAB — HEMOGLOBIN AND HEMATOCRIT, BLOOD
HCT: 30.9 — ABNORMAL LOW
HCT: 38.3 — ABNORMAL LOW
Hemoglobin: 13.1

## 2011-06-11 LAB — DIFFERENTIAL
Basophils Absolute: 0.1
Basophils Relative: 1
Eosinophils Absolute: 0.4
Eosinophils Relative: 6 — ABNORMAL HIGH
Neutrophils Relative %: 38 — ABNORMAL LOW

## 2011-06-11 LAB — COMPREHENSIVE METABOLIC PANEL
ALT: 117 — ABNORMAL HIGH
AST: 66 — ABNORMAL HIGH
Albumin: 4.6
Alkaline Phosphatase: 62
BUN: 9
CO2: 28
Calcium: 9.3
Chloride: 103
Creatinine, Ser: 1.07
GFR calc Af Amer: >60
GFR calc non Af Amer: >60
Glucose, Bld: 74
Potassium: 4.1
Sodium: 139
Total Bilirubin: 0.8
Total Protein: 6.8

## 2011-06-11 LAB — URINALYSIS, ROUTINE W REFLEX MICROSCOPIC
Bilirubin Urine: NEGATIVE
Glucose, UA: NEGATIVE
Hgb urine dipstick: NEGATIVE
Protein, ur: NEGATIVE
Urobilinogen, UA: 0.2

## 2011-06-11 LAB — BASIC METABOLIC PANEL
BUN: 11
BUN: 7
CO2: 24
Calcium: 7.9 — ABNORMAL LOW
Calcium: 8.1 — ABNORMAL LOW
Chloride: 106
Creatinine, Ser: 0.88
Creatinine, Ser: 0.93
GFR calc Af Amer: >60
GFR calc non Af Amer: >60
Glucose, Bld: 123 — ABNORMAL HIGH

## 2011-06-11 LAB — TYPE AND SCREEN: ABO/RH(D): O POS

## 2011-06-11 LAB — PROTIME-INR
INR: 1
Prothrombin Time: 13.6

## 2011-06-11 LAB — URINE CULTURE: Culture: NO GROWTH

## 2011-06-11 LAB — POCT I-STAT 4, (NA,K, GLUC, HGB,HCT)
Glucose, Bld: 157 — ABNORMAL HIGH
HCT: 35 — ABNORMAL LOW
Operator id: 153281

## 2011-06-11 LAB — HEMOGLOBIN A1C
Hgb A1c MFr Bld: 5.4
Mean Plasma Glucose: 115

## 2011-07-28 ENCOUNTER — Telehealth (INDEPENDENT_AMBULATORY_CARE_PROVIDER_SITE_OTHER): Payer: Self-pay | Admitting: General Surgery

## 2011-07-28 ENCOUNTER — Other Ambulatory Visit (INDEPENDENT_AMBULATORY_CARE_PROVIDER_SITE_OTHER): Payer: Self-pay | Admitting: General Surgery

## 2011-07-28 NOTE — Telephone Encounter (Signed)
Stacy from [facility] called wanting to confirm labs requested by Dr. Johna Sheriff could be done since last meal patient ate was at 11:30. I double checked with Lesly Rubenstein who agreed the testing can proceed.

## 2011-07-29 LAB — HEMOGLOBIN A1C
Hgb A1c MFr Bld: 5.3 % (ref <5.7)
Mean Plasma Glucose: 105 mg/dL (ref <117)

## 2011-07-29 LAB — COMPREHENSIVE METABOLIC PANEL
ALT: 47 U/L (ref 0–53)
AST: 31 U/L (ref 0–37)
Albumin: 4.2 g/dL (ref 3.5–5.2)
Calcium: 9.3 mg/dL (ref 8.4–10.5)
Chloride: 111 mEq/L (ref 96–112)
Creat: 1.05 mg/dL (ref 0.50–1.35)
Sodium: 144 mEq/L (ref 135–145)
Total Bilirubin: 0.4 mg/dL (ref 0.3–1.2)

## 2011-07-29 LAB — LIPID PANEL
Cholesterol: 120 mg/dL (ref 0–200)
HDL: 53 mg/dL (ref >39)
LDL Cholesterol: 43 mg/dL (ref 0–99)
Triglycerides: 119 mg/dL (ref <150)

## 2011-07-29 LAB — CBC WITH DIFFERENTIAL/PLATELET
Basophils Absolute: 0.1 10*3/uL (ref 0.0–0.1)
Eosinophils Relative: 6 % — ABNORMAL HIGH (ref 0–5)
Lymphocytes Relative: 38 % (ref 12–46)
MCV: 89 fL (ref 78.0–100.0)
Neutrophils Relative %: 49 % (ref 43–77)
Platelets: 206 10*3/uL (ref 150–400)
RDW: 12.6 % (ref 11.5–15.5)
WBC: 5.8 10*3/uL (ref 4.0–10.5)

## 2011-07-29 LAB — IRON AND TIBC
%SAT: 21 % (ref 20–55)
Iron: 68 ug/dL (ref 42–165)
TIBC: 321 ug/dL (ref 215–435)
UIBC: 253 ug/dL (ref 125–400)

## 2011-07-29 LAB — FOLATE: Folate: >20 ng/mL

## 2011-08-02 LAB — VITAMIN B1: Vitamin B1 (Thiamine): 44 nmol/L — ABNORMAL HIGH (ref 8–30)

## 2011-08-25 ENCOUNTER — Encounter (INDEPENDENT_AMBULATORY_CARE_PROVIDER_SITE_OTHER): Payer: Self-pay | Admitting: General Surgery

## 2011-10-27 ENCOUNTER — Ambulatory Visit (INDEPENDENT_AMBULATORY_CARE_PROVIDER_SITE_OTHER): Payer: Medicare Other | Admitting: General Surgery

## 2011-10-27 ENCOUNTER — Encounter (INDEPENDENT_AMBULATORY_CARE_PROVIDER_SITE_OTHER): Payer: Self-pay | Admitting: General Surgery

## 2011-10-27 HISTORY — DX: Morbid (severe) obesity due to excess calories: E66.01

## 2011-10-27 NOTE — Progress Notes (Signed)
History: Patient returns to the office now one year following laparoscopic Roux-en-Y gastric bypass. He has done extremely well. He has no specific complaints. He is able to tolerate small routine meals without difficulty and has early satiety. His energy level has improved. He is on greatly reduced medication. His blood sugars are normal and he remains on only one oral agent designed to preserve pancreatic function. He was on 5 blood pressure medications and is now only on reduced dose  verapamil. He has been retested for sleep apnea and still requires CPAP. His depression has been stable or improved.  Exam: BP 118/80  Pulse 72  Temp(Src) 98.2 F (36.8 C) (Temporal)  Resp 12  Ht 6\' 2"  (1.88 m)  Wt 188 lb (85.276 kg)  BMI 24.14 kg/m2  Gen.: Normal weight healthy-appearing male. Affect is improved from his previous visits. HEENT: A palpable mass or thyromegaly. Sclera nonicteric Lymph nodes: No palpable cervical, subclavicular or axillary nodes Lungs: Clear without wheezing or increased work of breathing Cardiovascular regular rate and rhythm without murmur. No edema Abdomen: Soft and nontender. No hernias. No palpable masses or organomegaly.  Lab work in November showed normal CBC and chemistries as well as normal iron and vitamin levels  Assessment and plan: Doing well one year following laparoscopic Roux-en-Y gastric bypass with excellent weight loss down to normal PMI and essentially resolution of diabetes and marked improvement of hypertension with sleep apnea and GERD current. No complications identified.He is very pleased pleased with the results. Return in one year.

## 2013-04-03 DIAGNOSIS — G8929 Other chronic pain: Secondary | ICD-10-CM

## 2013-04-03 DIAGNOSIS — R519 Headache, unspecified: Secondary | ICD-10-CM | POA: Insufficient documentation

## 2013-04-03 DIAGNOSIS — H409 Unspecified glaucoma: Secondary | ICD-10-CM

## 2013-04-03 DIAGNOSIS — F329 Major depressive disorder, single episode, unspecified: Secondary | ICD-10-CM | POA: Insufficient documentation

## 2013-04-03 DIAGNOSIS — M549 Dorsalgia, unspecified: Secondary | ICD-10-CM | POA: Insufficient documentation

## 2013-04-03 DIAGNOSIS — E119 Type 2 diabetes mellitus without complications: Secondary | ICD-10-CM | POA: Insufficient documentation

## 2013-04-03 DIAGNOSIS — F32A Depression, unspecified: Secondary | ICD-10-CM | POA: Insufficient documentation

## 2013-04-03 HISTORY — DX: Headache, unspecified: R51.9

## 2013-04-03 HISTORY — DX: Depression, unspecified: F32.A

## 2013-04-03 HISTORY — DX: Type 2 diabetes mellitus without complications: E11.9

## 2013-04-03 HISTORY — DX: Other chronic pain: G89.29

## 2013-04-03 HISTORY — DX: Unspecified glaucoma: H40.9

## 2013-04-04 DIAGNOSIS — Z820 Family history of epilepsy and other diseases of the nervous system: Secondary | ICD-10-CM

## 2013-04-04 DIAGNOSIS — G5760 Lesion of plantar nerve, unspecified lower limb: Secondary | ICD-10-CM | POA: Insufficient documentation

## 2013-04-04 DIAGNOSIS — M7750 Other enthesopathy of unspecified foot: Secondary | ICD-10-CM

## 2013-04-04 DIAGNOSIS — S46019A Strain of muscle(s) and tendon(s) of the rotator cuff of unspecified shoulder, initial encounter: Secondary | ICD-10-CM | POA: Insufficient documentation

## 2013-04-04 DIAGNOSIS — M542 Cervicalgia: Secondary | ICD-10-CM

## 2013-04-04 DIAGNOSIS — Z87891 Personal history of nicotine dependence: Secondary | ICD-10-CM

## 2013-04-04 DIAGNOSIS — Z9884 Bariatric surgery status: Secondary | ICD-10-CM

## 2013-04-04 HISTORY — DX: Family history of epilepsy and other diseases of the nervous system: Z82.0

## 2013-04-04 HISTORY — DX: Bariatric surgery status: Z98.84

## 2013-04-04 HISTORY — DX: Lesion of plantar nerve, unspecified lower limb: G57.60

## 2013-04-04 HISTORY — DX: Cervicalgia: M54.2

## 2013-04-04 HISTORY — DX: Personal history of nicotine dependence: Z87.891

## 2013-04-04 HISTORY — DX: Other enthesopathy of unspecified foot and ankle: M77.50

## 2013-04-04 HISTORY — DX: Strain of muscle(s) and tendon(s) of the rotator cuff of unspecified shoulder, initial encounter: S46.019A

## 2013-06-20 DIAGNOSIS — T79A0XA Compartment syndrome, unspecified, initial encounter: Secondary | ICD-10-CM | POA: Insufficient documentation

## 2013-06-20 DIAGNOSIS — F419 Anxiety disorder, unspecified: Secondary | ICD-10-CM | POA: Insufficient documentation

## 2013-06-20 HISTORY — DX: Compartment syndrome, unspecified, initial encounter: T79.A0XA

## 2016-06-11 ENCOUNTER — Encounter (HOSPITAL_COMMUNITY): Payer: Self-pay

## 2016-09-06 DIAGNOSIS — Z952 Presence of prosthetic heart valve: Secondary | ICD-10-CM

## 2016-09-06 HISTORY — DX: Presence of prosthetic heart valve: Z95.2

## 2016-11-17 DIAGNOSIS — R011 Cardiac murmur, unspecified: Secondary | ICD-10-CM

## 2016-11-17 DIAGNOSIS — N400 Enlarged prostate without lower urinary tract symptoms: Secondary | ICD-10-CM | POA: Insufficient documentation

## 2016-11-17 DIAGNOSIS — M1990 Unspecified osteoarthritis, unspecified site: Secondary | ICD-10-CM | POA: Insufficient documentation

## 2016-11-17 DIAGNOSIS — Z87442 Personal history of urinary calculi: Secondary | ICD-10-CM | POA: Insufficient documentation

## 2016-11-17 DIAGNOSIS — F322 Major depressive disorder, single episode, severe without psychotic features: Secondary | ICD-10-CM

## 2016-11-17 DIAGNOSIS — G43909 Migraine, unspecified, not intractable, without status migrainosus: Secondary | ICD-10-CM

## 2016-11-17 DIAGNOSIS — M545 Low back pain, unspecified: Secondary | ICD-10-CM

## 2016-11-17 DIAGNOSIS — G8929 Other chronic pain: Secondary | ICD-10-CM

## 2016-11-17 HISTORY — DX: Low back pain, unspecified: M54.50

## 2016-11-17 HISTORY — DX: Other chronic pain: G89.29

## 2016-11-17 HISTORY — DX: Migraine, unspecified, not intractable, without status migrainosus: G43.909

## 2016-11-17 HISTORY — DX: Cardiac murmur, unspecified: R01.1

## 2016-11-17 HISTORY — DX: Benign prostatic hyperplasia without lower urinary tract symptoms: N40.0

## 2016-11-17 HISTORY — DX: Personal history of urinary calculi: Z87.442

## 2016-11-17 HISTORY — DX: Major depressive disorder, single episode, severe without psychotic features: F32.2

## 2016-11-17 HISTORY — DX: Unspecified osteoarthritis, unspecified site: M19.90

## 2016-12-22 DIAGNOSIS — Q231 Congenital insufficiency of aortic valve: Secondary | ICD-10-CM | POA: Insufficient documentation

## 2016-12-22 HISTORY — DX: Congenital insufficiency of aortic valve: Q23.1

## 2017-03-06 HISTORY — PX: AORTIC VALVE REPLACEMENT: SHX41

## 2017-03-10 DIAGNOSIS — Z952 Presence of prosthetic heart valve: Secondary | ICD-10-CM

## 2017-03-10 HISTORY — DX: Presence of prosthetic heart valve: Z95.2

## 2017-04-11 ENCOUNTER — Encounter (HOSPITAL_COMMUNITY): Payer: Self-pay

## 2017-04-22 ENCOUNTER — Telehealth (HOSPITAL_COMMUNITY): Payer: Self-pay

## 2017-04-22 NOTE — Telephone Encounter (Signed)
Returned mail marked undeliverable: This patient is overdue for recommended follow-up with a bariatric surgeon at [facility]. A letter was mailed to the address on file 04/11/17 from both [facility] in attempt to reestablish post-op care. Letter has been returned today to [employer] marked undeliverable, unable to forward. No additional address on file in [facility] or [employer]. No email address on file. Information was shared with Mallory Shirk today at [facility] so she may contact the patient via phone again in attempt to get the patient scheduled for an appointment in their office.

## 2017-05-04 DIAGNOSIS — I442 Atrioventricular block, complete: Secondary | ICD-10-CM

## 2017-05-04 HISTORY — DX: Atrioventricular block, complete: I44.2

## 2017-07-07 DIAGNOSIS — L821 Other seborrheic keratosis: Secondary | ICD-10-CM | POA: Insufficient documentation

## 2017-07-07 DIAGNOSIS — L570 Actinic keratosis: Secondary | ICD-10-CM

## 2017-07-07 DIAGNOSIS — L814 Other melanin hyperpigmentation: Secondary | ICD-10-CM

## 2017-07-07 DIAGNOSIS — D229 Melanocytic nevi, unspecified: Secondary | ICD-10-CM

## 2017-07-07 DIAGNOSIS — D1801 Hemangioma of skin and subcutaneous tissue: Secondary | ICD-10-CM

## 2017-07-07 HISTORY — DX: Melanocytic nevi, unspecified: D22.9

## 2017-07-07 HISTORY — DX: Actinic keratosis: L57.0

## 2017-07-07 HISTORY — DX: Other seborrheic keratosis: L82.1

## 2017-07-07 HISTORY — DX: Other melanin hyperpigmentation: L81.4

## 2017-07-07 HISTORY — DX: Hemangioma of skin and subcutaneous tissue: D18.01

## 2018-07-28 DIAGNOSIS — D485 Neoplasm of uncertain behavior of skin: Secondary | ICD-10-CM | POA: Insufficient documentation

## 2018-07-28 HISTORY — DX: Neoplasm of uncertain behavior of skin: D48.5

## 2018-09-20 DIAGNOSIS — S82209A Unspecified fracture of shaft of unspecified tibia, initial encounter for closed fracture: Secondary | ICD-10-CM

## 2018-09-20 DIAGNOSIS — I10 Essential (primary) hypertension: Secondary | ICD-10-CM

## 2018-09-20 DIAGNOSIS — E039 Hypothyroidism, unspecified: Secondary | ICD-10-CM

## 2018-09-20 DIAGNOSIS — E119 Type 2 diabetes mellitus without complications: Secondary | ICD-10-CM

## 2018-09-20 DIAGNOSIS — F419 Anxiety disorder, unspecified: Secondary | ICD-10-CM

## 2018-09-20 DIAGNOSIS — I2510 Atherosclerotic heart disease of native coronary artery without angina pectoris: Secondary | ICD-10-CM

## 2018-10-27 ENCOUNTER — Other Ambulatory Visit: Payer: Self-pay | Admitting: Neurosurgery

## 2018-10-27 DIAGNOSIS — G959 Disease of spinal cord, unspecified: Secondary | ICD-10-CM

## 2018-10-30 ENCOUNTER — Ambulatory Visit (INDEPENDENT_AMBULATORY_CARE_PROVIDER_SITE_OTHER): Payer: Medicare Other | Admitting: Cardiology

## 2018-10-30 ENCOUNTER — Ambulatory Visit: Payer: BC Managed Care – PPO | Admitting: Cardiology

## 2018-10-30 ENCOUNTER — Encounter: Payer: Self-pay | Admitting: Cardiology

## 2018-10-30 VITALS — BP 118/62 | HR 91 | Ht 74.0 in | Wt 214.0 lb

## 2018-10-30 DIAGNOSIS — K219 Gastro-esophageal reflux disease without esophagitis: Secondary | ICD-10-CM

## 2018-10-30 DIAGNOSIS — E119 Type 2 diabetes mellitus without complications: Secondary | ICD-10-CM | POA: Insufficient documentation

## 2018-10-30 DIAGNOSIS — E785 Hyperlipidemia, unspecified: Secondary | ICD-10-CM | POA: Insufficient documentation

## 2018-10-30 DIAGNOSIS — E088 Diabetes mellitus due to underlying condition with unspecified complications: Secondary | ICD-10-CM

## 2018-10-30 DIAGNOSIS — E039 Hypothyroidism, unspecified: Secondary | ICD-10-CM | POA: Insufficient documentation

## 2018-10-30 DIAGNOSIS — Z952 Presence of prosthetic heart valve: Secondary | ICD-10-CM

## 2018-10-30 DIAGNOSIS — F419 Anxiety disorder, unspecified: Secondary | ICD-10-CM

## 2018-10-30 DIAGNOSIS — E1169 Type 2 diabetes mellitus with other specified complication: Secondary | ICD-10-CM | POA: Diagnosis not present

## 2018-10-30 DIAGNOSIS — R5383 Other fatigue: Secondary | ICD-10-CM

## 2018-10-30 DIAGNOSIS — I10 Essential (primary) hypertension: Secondary | ICD-10-CM

## 2018-10-30 DIAGNOSIS — Z950 Presence of cardiac pacemaker: Secondary | ICD-10-CM

## 2018-10-30 DIAGNOSIS — M1990 Unspecified osteoarthritis, unspecified site: Secondary | ICD-10-CM

## 2018-10-30 DIAGNOSIS — E782 Mixed hyperlipidemia: Secondary | ICD-10-CM

## 2018-10-30 DIAGNOSIS — F329 Major depressive disorder, single episode, unspecified: Secondary | ICD-10-CM | POA: Insufficient documentation

## 2018-10-30 DIAGNOSIS — E559 Vitamin D deficiency, unspecified: Secondary | ICD-10-CM | POA: Insufficient documentation

## 2018-10-30 HISTORY — DX: Unspecified osteoarthritis, unspecified site: M19.90

## 2018-10-30 HISTORY — DX: Type 2 diabetes mellitus without complications: E11.9

## 2018-10-30 HISTORY — DX: Hyperlipidemia, unspecified: E78.5

## 2018-10-30 HISTORY — DX: Other fatigue: R53.83

## 2018-10-30 HISTORY — DX: Anxiety disorder, unspecified: F41.9

## 2018-10-30 HISTORY — DX: Vitamin D deficiency, unspecified: E55.9

## 2018-10-30 HISTORY — DX: Hypothyroidism, unspecified: E03.9

## 2018-10-30 HISTORY — DX: Essential (primary) hypertension: I10

## 2018-10-30 HISTORY — DX: Gastro-esophageal reflux disease without esophagitis: K21.9

## 2018-10-30 HISTORY — DX: Diabetes mellitus due to underlying condition with unspecified complications: E08.8

## 2018-10-30 HISTORY — DX: Mixed hyperlipidemia: E78.2

## 2018-10-30 NOTE — Progress Notes (Signed)
Cardiology Office Note:    Date:  10/30/2018   ID:  Antonio Oliver, DOB [birth year], MRN [id number]  PCP:  Cher Nakai, MD  Cardiologist:  Jenean Lindau, MD   Referring MD: Cher Nakai, MD    ASSESSMENT:    1. History of permanent cardiac pacemaker placement   2. Type 2 diabetes mellitus with other specified complication, unspecified whether long term insulin use ([name])   3. Hyperlipidemia, unspecified hyperlipidemia type   4. Osteoarthritis, unspecified osteoarthritis type, unspecified site   5. Gastroesophageal reflux disease, esophagitis presence not specified   6. Fatigue, unspecified type   7. Mixed dyslipidemia   8. Diabetes mellitus due to underlying condition with unspecified complications ([name])   9. Essential hypertension   10. S/P AVR (aortic valve replacement)    PLAN:    In order of problems listed above:  1. Primary prevention stressed with the patient.  Importance of compliance with diet and medication stressed and he vocalized understanding.  His blood pressure is stable.  Diet was discussed for dyslipidemia. 2. He will undergo echocardiogram to assess aortic valve.  We would like to have a baseline study get an idea of his aortic valve anatomy.  This was replaced in the remote past 3. We will get him established with a electrophysiologist for pacemaker check.  We will also get a copy of his records.  He tells me that he has a English as a second language teacher and a bovine prosthesis from his description. 4. Patient will be seen in follow-up appointment in 6 months or earlier if the patient has any concerns    Medication Adjustments/Labs and Tests Ordered: Current medicines are reviewed at length with the patient today.  Concerns regarding medicines are outlined above.  No orders of the defined types were placed in this encounter.  No orders of the defined types were placed in this encounter.    History of Present Illness:    Antonio Oliver is a 63 y.o. male who is  being seen today for the evaluation of permanent pacemaker and to get established at the request of Cher Nakai, MD.  Patient is a pleasant 63 year old male.  He appears older than stated age.  He has moved to this part of the [place].  His wife accompanies him for this visit and is very supportive.  He is undergone aortic valve replacement and had pacemaker at that time.  He is here to be established for the same reason.  He has issues with dementia and he has orthopedic issues for which reason he is sedentary.  The wife tells me that he suffers from major depression.  At the time of my evaluation, the patient is alert awake oriented and in no distress.  Past Medical History:  Diagnosis Date  . BPH (benign prostatic hyperplasia)   . Depression   . Diabetes mellitus   . Glaucoma   . Hypertension     Past Surgical History:  Procedure Laterality Date  . BACK SURGERY     x4  . FOOT NEUROMA SURGERY    . GASTRIC BYPASS    . HEMORRHOID SURGERY     over 30 years ago  . HERNIA REPAIR     [facility] umb  . NASAL SINUS SURGERY     x2  . NECK SURGERY      Current Medications: Current Meds  Medication Sig  . aspirin 81 MG tablet Take 81 mg by mouth daily.  Marland Kitchen atorvastatin (LIPITOR) 20 MG tablet Take  20 mg by mouth daily.  Marland Kitchen buPROPion (WELLBUTRIN SR) 200 MG 12 hr tablet Take 200 mg by mouth 2 (two) times daily.  . busPIRone (BUSPAR) 10 MG tablet Take 10 mg by mouth 2 (two) times daily.  . Calcium Citrate-Vitamin D (CALCIUM CITRATE + PO) Take by mouth.  . celecoxib (CELEBREX) 200 MG capsule Take 200 mg by mouth 2 (two) times daily.  . Cholecalciferol (VITAMIN D PO) Take 10,000 Units by mouth daily.  . clonazePAM (KLONOPIN) 1 MG tablet Take 1 mg by mouth 2 (two) times daily as needed.  . DULoxetine (CYMBALTA) 60 MG capsule Take 60 mg by mouth daily.  . fexofenadine (ALLEGRA) 180 MG tablet Take 180 mg by mouth daily.  . finasteride (PROSCAR) 5 MG tablet Take 5 mg by mouth daily.  . fluticasone (FLONASE)  50 MCG/ACT nasal spray Place 2 sprays into the nose daily.  . folic acid (FOLVITE) 1 MG tablet Take 1 mg by mouth daily.  . furosemide (LASIX) 40 MG tablet Take 40 mg by mouth daily.  . lansoprazole (PREVACID) 30 MG capsule Take 30 mg by mouth daily.  Marland Kitchen levothyroxine (SYNTHROID, LEVOTHROID) 150 MCG tablet Take 150 mcg by mouth daily.  Marland Kitchen lisinopril (PRINIVIL,ZESTRIL) 5 MG tablet Take 5 mg by mouth daily.  . Magnesium 500 MG CAPS Take by mouth.  . metFORMIN (GLUCOPHAGE) 1000 MG tablet Take 1,000 mg by mouth 2 (two) times daily with a meal.  . mirtazapine (REMERON) 30 MG tablet Take 60 mg by mouth at bedtime.  . Multiple Vitamin (MULTIVITAMIN) capsule Take 1 capsule by mouth daily.  Marland Kitchen omeprazole (PRILOSEC) 20 MG capsule Take 20 mg by mouth daily.  Marland Kitchen oxyCODONE-acetaminophen (PERCOCET) 10-325 MG tablet Take 1 tablet by mouth every 4 (four) hours as needed for pain.  . potassium chloride (KLOR-CON) 20 MEQ packet Take 20 mEq by mouth 2 (two) times daily.  . pramipexole (MIRAPEX) 1 MG tablet Take 1 mg by mouth 3 (three) times daily.  . silodosin (RAPAFLO) 8 MG CAPS capsule Take 8 mg by mouth daily with breakfast.  . SUMAtriptan (IMITREX) 6 MG/0.5ML SOLN injection Inject 6 mg into the skin every 2 (two) hours as needed for migraine or headache. May repeat in 2 hours if headache persists or recurs.  Marland Kitchen tiZANidine (ZANAFLEX) 4 MG capsule Take 4 mg by mouth 3 (three) times daily.  . verapamil (CALAN) 120 MG tablet Take 120 mg by mouth daily.     Allergies:   Morphine   Social History   Socioeconomic History  . Marital status: Married    Spouse name: Not on file  . Number of children: Not on file  . Years of education: Not on file  . Highest education level: Not on file  Occupational History  . Not on file  Social Needs  . Financial resource strain: Not on file  . Food insecurity:    Worry: Not on file    Inability: Not on file  . Transportation needs:    Medical: Not on file    Non-medical:  Not on file  Tobacco Use  . Smoking status: Never Smoker  . Smokeless tobacco: Never Used  Substance and Sexual Activity  . Alcohol use: Yes    Comment: occ.  . Drug use: No  . Sexual activity: Not on file  Lifestyle  . Physical activity:    Days per week: Not on file    Minutes per session: Not on file  . Stress: Not on file  Relationships  . Social connections:    Talks on phone: Not on file    Gets together: Not on file    Attends religious service: Not on file    Active member of club or organization: Not on file    Attends meetings of clubs or organizations: Not on file    Relationship status: Not on file  Other Topics Concern  . Not on file  Social History Narrative  . Not on file     Family History: The patient's family history includes Cancer in his mother; Heart disease in his father.  ROS:   Please see the history of present illness.    All other systems reviewed and are negative.  EKGs/Labs/Other Studies Reviewed:    The following studies were reviewed today: I discussed my findings with the patient at extensive length.   Recent Labs: No results found for requested labs within last 8760 hours.  Recent Lipid Panel    Component Value Date/Time   CHOL 120 07/28/2011 1630   TRIG 119 07/28/2011 1630   HDL 53 07/28/2011 1630   CHOLHDL 2.3 07/28/2011 1630   VLDL 24 07/28/2011 1630   LDLCALC 43 07/28/2011 1630    Physical Exam:    VS:  BP 118/62 (BP Location: Right Arm, Patient Position: Sitting, Cuff Size: Normal)   Pulse 91   Ht 6\' 2"  (1.88 m)   Wt 214 lb (97.1 kg)   SpO2 98%   BMI 27.48 kg/m     Wt Readings from Last 3 Encounters:  10/30/18 214 lb (97.1 kg)  10/27/11 188 lb (85.3 kg)     GEN: Patient is in no acute distress HEENT: Normal NECK: No JVD; No carotid bruits LYMPHATICS: No lymphadenopathy CARDIAC: S1 S2 regular, 2/6 systolic murmur at the apex. RESPIRATORY:  Clear to auscultation without rales, wheezing or rhonchi  ABDOMEN:  Soft, non-tender, non-distended MUSCULOSKELETAL:  No edema; No deformity  SKIN: Warm and dry NEUROLOGIC:  Alert and oriented x 3 PSYCHIATRIC:  Normal affect    Signed, Jenean Lindau, MD  10/30/2018 4:44 PM    [facility]

## 2018-10-30 NOTE — Patient Instructions (Signed)
Medication Instructions:  Your physician has recommended you make the following change in your medication:   Stop: Verapamil   If you need a refill on your cardiac medications before your next appointment, please call your pharmacy.   Lab work: None.  If you have labs (blood work) drawn today and your tests are completely normal, you will receive your results only by: Marland Kitchen MyChart Message (if you have MyChart) OR . A paper copy in the mail If you have any lab test that is abnormal or we need to change your treatment, we will call you to review the results.  Testing/Procedures: Your physician has requested that you have an echocardiogram. Echocardiography is a painless test that uses sound waves to create images of your heart. It provides your doctor with information about the size and shape of your heart and how well your heart's chambers and valves are working. This procedure takes approximately one hour. There are no restrictions for this procedure.   Follow-Up: At [facility], you and your health needs are our priority.  As part of our continuing mission to provide you with exceptional heart care, we have created designated Provider Care Teams.  These Care Teams include your primary Cardiologist (physician) and Advanced Practice Providers (APPs -  Physician Assistants and Nurse Practitioners) who all work together to provide you with the care you need, when you need it. You will need a follow up appointment in 6 months.  Please call our office 2 months in advance to schedule this appointment.  You may see No primary care provider on file.  or another member of our [employer] Provider Team in [place]: Jenne Campus, MD . Shirlee More, MD  Any Other Special Instructions Will Be Listed Below (If Applicable).  Dr. Geraldo Pitter has advised you to see Dr. Curt Bears with electrophysiology for pacemaker management. We will schedule this today.     Echocardiogram An echocardiogram is a  procedure that uses painless sound waves (ultrasound) to produce an image of the heart. Images from an echocardiogram can provide important information about:  Signs of coronary artery disease (CAD).  Aneurysm detection. An aneurysm is a weak or damaged part of an artery wall that bulges out from the normal force of blood pumping through the body.  Heart size and shape. Changes in the size or shape of the heart can be associated with certain conditions, including heart failure, aneurysm, and CAD.  Heart muscle function.  Heart valve function.  Signs of a past heart attack.  Fluid buildup around the heart.  Thickening of the heart muscle.  A tumor or infectious growth around the heart valves. Tell a health care provider about:  Any allergies you have.  All medicines you are taking, including vitamins, herbs, eye drops, creams, and over-the-counter medicines.  Any blood disorders you have.  Any surgeries you have had.  Any medical conditions you have.  Whether you are pregnant or may be pregnant. What are the risks? Generally, this is a safe procedure. However, problems may occur, including:  Allergic reaction to dye (contrast) that may be used during the procedure. What happens before the procedure? No specific preparation is needed. You may eat and drink normally. What happens during the procedure?   An IV tube may be inserted into one of your veins.  You may receive contrast through this tube. A contrast is an injection that improves the quality of the pictures from your heart.  A gel will be applied to your chest.  A wand-like tool (transducer) will be moved over your chest. The gel will help to transmit the sound waves from the transducer.  The sound waves will harmlessly bounce off of your heart to allow the heart images to be captured in real-time motion. The images will be recorded on a computer. The procedure may vary among health care providers and  hospitals. What happens after the procedure?  You may return to your normal, everyday life, including diet, activities, and medicines, unless your health care provider tells you not to do that. Summary  An echocardiogram is a procedure that uses painless sound waves (ultrasound) to produce an image of the heart.  Images from an echocardiogram can provide important information about the size and shape of your heart, heart muscle function, heart valve function, and fluid buildup around your heart.  You do not need to do anything to prepare before this procedure. You may eat and drink normally.  After the echocardiogram is completed, you may return to your normal, everyday life, unless your health care provider tells you not to do that. This information is not intended to replace advice given to you by your health care provider. Make sure you discuss any questions you have with your health care provider. Document Released: 08/20/2000 Document Revised: 09/25/2016 Document Reviewed: 09/25/2016 Elsevier Interactive Patient Education  2019 [employer].

## 2018-11-03 ENCOUNTER — Other Ambulatory Visit: Payer: Self-pay | Admitting: Neurosurgery

## 2018-11-03 DIAGNOSIS — G959 Disease of spinal cord, unspecified: Secondary | ICD-10-CM

## 2018-11-06 ENCOUNTER — Ambulatory Visit
Admission: RE | Admit: 2018-11-06 | Discharge: 2018-11-06 | Disposition: A | Discharge status: Home / Self Care | Payer: Medicare Other | Source: Ambulatory Visit | Attending: Neurosurgery | Admitting: Neurosurgery

## 2018-11-06 DIAGNOSIS — G959 Disease of spinal cord, unspecified: Secondary | ICD-10-CM

## 2018-11-14 ENCOUNTER — Emergency Department (HOSPITAL_COMMUNITY): Payer: Medicare Other

## 2018-11-14 ENCOUNTER — Other Ambulatory Visit: Payer: Self-pay

## 2018-11-14 ENCOUNTER — Inpatient Hospital Stay (HOSPITAL_COMMUNITY)
Admission: EM | Admit: 2018-11-14 | Discharge: 2018-11-20 | DRG: 472 | Disposition: A | Discharge status: Home Health | Payer: Medicare Other | Attending: Orthopedic Surgery | Admitting: Orthopedic Surgery

## 2018-11-14 ENCOUNTER — Encounter (HOSPITAL_COMMUNITY): Payer: Self-pay | Admitting: Emergency Medicine

## 2018-11-14 DIAGNOSIS — Z8249 Family history of ischemic heart disease and other diseases of the circulatory system: Secondary | ICD-10-CM

## 2018-11-14 DIAGNOSIS — R52 Pain, unspecified: Secondary | ICD-10-CM

## 2018-11-14 DIAGNOSIS — Z79899 Other long term (current) drug therapy: Secondary | ICD-10-CM

## 2018-11-14 DIAGNOSIS — W19XXXD Unspecified fall, subsequent encounter: Secondary | ICD-10-CM | POA: Diagnosis present

## 2018-11-14 DIAGNOSIS — S82492D Other fracture of shaft of left fibula, subsequent encounter for closed fracture with routine healing: Secondary | ICD-10-CM | POA: Diagnosis not present

## 2018-11-14 DIAGNOSIS — Z7982 Long term (current) use of aspirin: Secondary | ICD-10-CM | POA: Diagnosis not present

## 2018-11-14 DIAGNOSIS — J988 Other specified respiratory disorders: Secondary | ICD-10-CM | POA: Diagnosis not present

## 2018-11-14 DIAGNOSIS — S82202D Unspecified fracture of shaft of left tibia, subsequent encounter for closed fracture with routine healing: Secondary | ICD-10-CM

## 2018-11-14 DIAGNOSIS — N400 Enlarged prostate without lower urinary tract symptoms: Secondary | ICD-10-CM | POA: Diagnosis present

## 2018-11-14 DIAGNOSIS — Z79891 Long term (current) use of opiate analgesic: Secondary | ICD-10-CM | POA: Diagnosis not present

## 2018-11-14 DIAGNOSIS — Z419 Encounter for procedure for purposes other than remedying health state, unspecified: Secondary | ICD-10-CM | POA: Diagnosis not present

## 2018-11-14 DIAGNOSIS — M5412 Radiculopathy, cervical region: Secondary | ICD-10-CM | POA: Diagnosis present

## 2018-11-14 DIAGNOSIS — Z981 Arthrodesis status: Secondary | ICD-10-CM | POA: Diagnosis not present

## 2018-11-14 DIAGNOSIS — R531 Weakness: Secondary | ICD-10-CM

## 2018-11-14 DIAGNOSIS — H409 Unspecified glaucoma: Secondary | ICD-10-CM | POA: Diagnosis not present

## 2018-11-14 DIAGNOSIS — Z885 Allergy status to narcotic agent status: Secondary | ICD-10-CM | POA: Diagnosis not present

## 2018-11-14 DIAGNOSIS — Z809 Family history of malignant neoplasm, unspecified: Secondary | ICD-10-CM

## 2018-11-14 DIAGNOSIS — G959 Disease of spinal cord, unspecified: Secondary | ICD-10-CM

## 2018-11-14 DIAGNOSIS — E119 Type 2 diabetes mellitus without complications: Secondary | ICD-10-CM | POA: Diagnosis not present

## 2018-11-14 DIAGNOSIS — M4802 Spinal stenosis, cervical region: Principal | ICD-10-CM | POA: Diagnosis present

## 2018-11-14 DIAGNOSIS — K219 Gastro-esophageal reflux disease without esophagitis: Secondary | ICD-10-CM | POA: Diagnosis present

## 2018-11-14 DIAGNOSIS — Z7984 Long term (current) use of oral hypoglycemic drugs: Secondary | ICD-10-CM

## 2018-11-14 DIAGNOSIS — S82302D Unspecified fracture of lower end of left tibia, subsequent encounter for closed fracture with routine healing: Secondary | ICD-10-CM | POA: Diagnosis not present

## 2018-11-14 DIAGNOSIS — Z950 Presence of cardiac pacemaker: Secondary | ICD-10-CM

## 2018-11-14 DIAGNOSIS — W19XXXA Unspecified fall, initial encounter: Secondary | ICD-10-CM | POA: Diagnosis present

## 2018-11-14 DIAGNOSIS — D72829 Elevated white blood cell count, unspecified: Secondary | ICD-10-CM | POA: Diagnosis not present

## 2018-11-14 DIAGNOSIS — S92245A Nondisplaced fracture of medial cuneiform of left foot, initial encounter for closed fracture: Secondary | ICD-10-CM

## 2018-11-14 DIAGNOSIS — E785 Hyperlipidemia, unspecified: Secondary | ICD-10-CM | POA: Diagnosis present

## 2018-11-14 DIAGNOSIS — M2578 Osteophyte, vertebrae: Secondary | ICD-10-CM | POA: Diagnosis present

## 2018-11-14 DIAGNOSIS — M4712 Other spondylosis with myelopathy, cervical region: Secondary | ICD-10-CM | POA: Diagnosis present

## 2018-11-14 DIAGNOSIS — I10 Essential (primary) hypertension: Secondary | ICD-10-CM | POA: Diagnosis present

## 2018-11-14 DIAGNOSIS — Z9884 Bariatric surgery status: Secondary | ICD-10-CM | POA: Diagnosis not present

## 2018-11-14 DIAGNOSIS — F419 Anxiety disorder, unspecified: Secondary | ICD-10-CM | POA: Diagnosis present

## 2018-11-14 DIAGNOSIS — F329 Major depressive disorder, single episode, unspecified: Secondary | ICD-10-CM | POA: Diagnosis present

## 2018-11-14 HISTORY — DX: Disease of spinal cord, unspecified: G95.9

## 2018-11-14 HISTORY — DX: Presence of cardiac pacemaker: Z95.0

## 2018-11-14 LAB — CBC WITH DIFFERENTIAL/PLATELET
ABS IMMATURE GRANULOCYTES: 0.04 10*3/uL (ref 0.00–0.07)
Basophils Absolute: 0.1 10*3/uL (ref 0.0–0.1)
Basophils Relative: 1 %
EOS PCT: 5 %
Eosinophils Absolute: 0.6 10*3/uL — ABNORMAL HIGH (ref 0.0–0.5)
HCT: 36.3 % — ABNORMAL LOW (ref 39.0–52.0)
HEMOGLOBIN: 10.8 g/dL — AB (ref 13.0–17.0)
Immature Granulocytes: 0 %
Lymphocytes Relative: 19 %
Lymphs Abs: 2.1 10*3/uL (ref 0.7–4.0)
MCH: 24.3 pg — ABNORMAL LOW (ref 26.0–34.0)
MCHC: 29.8 g/dL — AB (ref 30.0–36.0)
MCV: 81.8 fL (ref 80.0–100.0)
MONO ABS: 0.9 10*3/uL (ref 0.1–1.0)
Monocytes Relative: 8 %
NEUTROS ABS: 7.6 10*3/uL (ref 1.7–7.7)
Neutrophils Relative %: 67 %
Platelets: 253 10*3/uL (ref 150–400)
RBC: 4.44 MIL/uL (ref 4.22–5.81)
RDW: 14.2 % (ref 11.5–15.5)
WBC: 11.2 10*3/uL — ABNORMAL HIGH (ref 4.0–10.5)
nRBC: 0 % (ref 0.0–0.2)

## 2018-11-14 LAB — I-STAT TROPONIN, ED: TROPONIN I, POC: 0 ng/mL (ref 0.00–0.08)

## 2018-11-14 LAB — URINALYSIS, ROUTINE W REFLEX MICROSCOPIC
Bilirubin Urine: NEGATIVE
Glucose, UA: NEGATIVE mg/dL
Ketones, ur: NEGATIVE mg/dL
Leukocytes,Ua: NEGATIVE
Nitrite: NEGATIVE
Protein, ur: NEGATIVE mg/dL
RBC / HPF: >50 RBC/hpf — ABNORMAL HIGH (ref 0–5)
Specific Gravity, Urine: 1.012 (ref 1.005–1.030)
pH: 5 (ref 5.0–8.0)

## 2018-11-14 LAB — COMPREHENSIVE METABOLIC PANEL
ALT: 39 U/L (ref 0–44)
AST: 35 U/L (ref 15–41)
Albumin: 3.9 g/dL (ref 3.5–5.0)
Alkaline Phosphatase: 131 U/L — ABNORMAL HIGH (ref 38–126)
Anion gap: 7 (ref 5–15)
BUN: 8 mg/dL (ref 8–23)
CO2: 23 mmol/L (ref 22–32)
Calcium: 9.3 mg/dL (ref 8.9–10.3)
Chloride: 106 mmol/L (ref 98–111)
Creatinine, Ser: 0.8 mg/dL (ref 0.61–1.24)
GFR calc Af Amer: >60 mL/min (ref >60)
GFR calc non Af Amer: >60 mL/min (ref >60)
Glucose, Bld: 138 mg/dL — ABNORMAL HIGH (ref 70–99)
Potassium: 4.4 mmol/L (ref 3.5–5.1)
Sodium: 136 mmol/L (ref 135–145)
Total Bilirubin: 0.7 mg/dL (ref 0.3–1.2)
Total Protein: 6.1 g/dL — ABNORMAL LOW (ref 6.5–8.1)

## 2018-11-14 MED ORDER — OXYCODONE HCL 5 MG PO TABS
5.0000 mg | ORAL_TABLET | ORAL | Status: DC | PRN
  Administered 2018-11-15 – 2018-11-19 (×15): 5 mg via ORAL
  Filled 2018-11-14 (×15): qty 1

## 2018-11-14 MED ORDER — ADULT MULTIVITAMIN W/MINERALS CH
1.0000 | ORAL_TABLET | Freq: Every day | ORAL | Status: DC
  Administered 2018-11-16 – 2018-11-20 (×5): 1 via ORAL
  Filled 2018-11-14 (×5): qty 1

## 2018-11-14 MED ORDER — OXYCODONE-ACETAMINOPHEN 5-325 MG PO TABS
2.0000 | ORAL_TABLET | Freq: Once | ORAL | Status: AC
  Administered 2018-11-14: 2 via ORAL
  Filled 2018-11-14: qty 2

## 2018-11-14 MED ORDER — SODIUM CHLORIDE 0.9% FLUSH
3.0000 mL | Freq: Two times a day (BID) | INTRAVENOUS | Status: DC
  Administered 2018-11-14 – 2018-11-18 (×7): 3 mL via INTRAVENOUS

## 2018-11-14 MED ORDER — MAGNESIUM OXIDE 400 (241.3 MG) MG PO TABS
400.0000 mg | ORAL_TABLET | Freq: Every day | ORAL | Status: DC
  Administered 2018-11-16 – 2018-11-20 (×5): 400 mg via ORAL
  Filled 2018-11-14 (×5): qty 1

## 2018-11-14 MED ORDER — METFORMIN HCL 500 MG PO TABS
1000.0000 mg | ORAL_TABLET | Freq: Two times a day (BID) | ORAL | Status: DC
  Administered 2018-11-16 – 2018-11-20 (×8): 1000 mg via ORAL
  Filled 2018-11-14 (×10): qty 2

## 2018-11-14 MED ORDER — ATORVASTATIN CALCIUM 10 MG PO TABS
20.0000 mg | ORAL_TABLET | Freq: Every day | ORAL | Status: DC
  Administered 2018-11-15 – 2018-11-19 (×5): 20 mg via ORAL
  Filled 2018-11-14 (×5): qty 2

## 2018-11-14 MED ORDER — BUPROPION HCL ER (SR) 100 MG PO TB12
200.0000 mg | ORAL_TABLET | Freq: Two times a day (BID) | ORAL | Status: DC
  Administered 2018-11-16 – 2018-11-20 (×9): 200 mg via ORAL
  Filled 2018-11-14 (×12): qty 2

## 2018-11-14 MED ORDER — LORATADINE 10 MG PO TABS
10.0000 mg | ORAL_TABLET | Freq: Every day | ORAL | Status: DC
  Administered 2018-11-16 – 2018-11-20 (×5): 10 mg via ORAL
  Filled 2018-11-14 (×5): qty 1

## 2018-11-14 MED ORDER — BUSPIRONE HCL 10 MG PO TABS
10.0000 mg | ORAL_TABLET | Freq: Two times a day (BID) | ORAL | Status: DC
  Administered 2018-11-15 – 2018-11-20 (×10): 10 mg via ORAL
  Filled 2018-11-14 (×11): qty 1

## 2018-11-14 MED ORDER — LISINOPRIL 5 MG PO TABS
5.0000 mg | ORAL_TABLET | Freq: Every day | ORAL | Status: DC
  Administered 2018-11-16 – 2018-11-20 (×5): 5 mg via ORAL
  Filled 2018-11-14 (×5): qty 1

## 2018-11-14 MED ORDER — PRAMIPEXOLE DIHYDROCHLORIDE 1 MG PO TABS
1.0000 mg | ORAL_TABLET | Freq: Three times a day (TID) | ORAL | Status: DC
  Administered 2018-11-14 – 2018-11-16 (×4): 1 mg via ORAL
  Filled 2018-11-14 (×8): qty 1

## 2018-11-14 MED ORDER — CALCIUM CARBONATE-VITAMIN D 500-200 MG-UNIT PO TABS
1.0000 | ORAL_TABLET | Freq: Every day | ORAL | Status: DC
  Administered 2018-11-16 – 2018-11-20 (×5): 1 via ORAL
  Filled 2018-11-14 (×6): qty 1

## 2018-11-14 MED ORDER — LEVOTHYROXINE SODIUM 75 MCG PO TABS
150.0000 ug | ORAL_TABLET | Freq: Every day | ORAL | Status: DC
  Administered 2018-11-15 – 2018-11-20 (×6): 150 ug via ORAL
  Filled 2018-11-14 (×6): qty 2

## 2018-11-14 MED ORDER — FINASTERIDE 5 MG PO TABS
5.0000 mg | ORAL_TABLET | Freq: Every day | ORAL | Status: DC
  Administered 2018-11-16 – 2018-11-20 (×5): 5 mg via ORAL
  Filled 2018-11-14 (×5): qty 1

## 2018-11-14 MED ORDER — FLUTICASONE PROPIONATE 50 MCG/ACT NA SUSP
2.0000 | Freq: Every day | NASAL | Status: DC
  Administered 2018-11-15 – 2018-11-20 (×6): 2 via NASAL
  Filled 2018-11-14 (×2): qty 16

## 2018-11-14 MED ORDER — TAMSULOSIN HCL 0.4 MG PO CAPS
0.4000 mg | ORAL_CAPSULE | Freq: Every day | ORAL | Status: DC
  Administered 2018-11-16 – 2018-11-20 (×5): 0.4 mg via ORAL
  Filled 2018-11-14 (×5): qty 1

## 2018-11-14 MED ORDER — VITAMIN D 25 MCG (1000 UNIT) PO TABS
1000.0000 [IU] | ORAL_TABLET | Freq: Every day | ORAL | Status: DC
  Administered 2018-11-16 – 2018-11-20 (×5): 1000 [IU] via ORAL
  Filled 2018-11-14 (×5): qty 1

## 2018-11-14 MED ORDER — FOLIC ACID 1 MG PO TABS
1.0000 mg | ORAL_TABLET | Freq: Every day | ORAL | Status: DC
  Administered 2018-11-16 – 2018-11-20 (×5): 1 mg via ORAL
  Filled 2018-11-14 (×5): qty 1

## 2018-11-14 MED ORDER — SODIUM CHLORIDE 0.9 % IV SOLN: 250.0000 mL | INTRAVENOUS | Status: DC | PRN

## 2018-11-14 MED ORDER — TIZANIDINE HCL 4 MG PO TABS
4.0000 mg | ORAL_TABLET | Freq: Three times a day (TID) | ORAL | Status: DC
  Administered 2018-11-14 – 2018-11-20 (×15): 4 mg via ORAL
  Filled 2018-11-14 (×16): qty 1

## 2018-11-14 MED ORDER — OXYCODONE-ACETAMINOPHEN 5-325 MG PO TABS
1.0000 | ORAL_TABLET | ORAL | Status: DC | PRN
  Administered 2018-11-15 – 2018-11-20 (×16): 1 via ORAL
  Filled 2018-11-14 (×17): qty 1

## 2018-11-14 MED ORDER — OXYCODONE-ACETAMINOPHEN 10-325 MG PO TABS: 1.0000 | ORAL_TABLET | ORAL | Status: DC | PRN

## 2018-11-14 MED ORDER — INSULIN ASPART 100 UNIT/ML ~~LOC~~ SOLN
0.0000 [IU] | Freq: Three times a day (TID) | SUBCUTANEOUS | Status: DC
  Administered 2018-11-16: 3 [IU] via SUBCUTANEOUS
  Administered 2018-11-16: 2 [IU] via SUBCUTANEOUS
  Administered 2018-11-16: 3 [IU] via SUBCUTANEOUS
  Administered 2018-11-17: 5 [IU] via SUBCUTANEOUS
  Administered 2018-11-17 (×2): 2 [IU] via SUBCUTANEOUS
  Administered 2018-11-18 (×2): 3 [IU] via SUBCUTANEOUS
  Administered 2018-11-19: 2 [IU] via SUBCUTANEOUS
  Administered 2018-11-19: 3 [IU] via SUBCUTANEOUS
  Administered 2018-11-20 (×2): 2 [IU] via SUBCUTANEOUS

## 2018-11-14 MED ORDER — DULOXETINE HCL 60 MG PO CPEP
60.0000 mg | ORAL_CAPSULE | Freq: Every day | ORAL | Status: DC
  Administered 2018-11-16 – 2018-11-20 (×5): 60 mg via ORAL
  Filled 2018-11-14 (×5): qty 1

## 2018-11-14 MED ORDER — ACETAMINOPHEN 650 MG RE SUPP: 650.0000 mg | Freq: Four times a day (QID) | RECTAL | Status: DC | PRN

## 2018-11-14 MED ORDER — ACETAMINOPHEN 325 MG PO TABS
650.0000 mg | ORAL_TABLET | Freq: Four times a day (QID) | ORAL | Status: DC | PRN
  Filled 2018-11-14: qty 2

## 2018-11-14 MED ORDER — POTASSIUM CHLORIDE 20 MEQ/15ML (10%) PO SOLN
20.0000 meq | Freq: Two times a day (BID) | ORAL | Status: DC
  Administered 2018-11-14 – 2018-11-20 (×11): 20 meq via ORAL
  Filled 2018-11-14 (×11): qty 15

## 2018-11-14 MED ORDER — PANTOPRAZOLE SODIUM 40 MG PO TBEC
40.0000 mg | DELAYED_RELEASE_TABLET | Freq: Every day | ORAL | Status: DC
  Administered 2018-11-16 – 2018-11-20 (×5): 40 mg via ORAL
  Filled 2018-11-14 (×5): qty 1

## 2018-11-14 MED ORDER — DIPHENHYDRAMINE HCL 50 MG/ML IJ SOLN
12.5000 mg | Freq: Once | INTRAMUSCULAR | Status: AC
  Administered 2018-11-14: 12.5 mg via INTRAVENOUS
  Filled 2018-11-14: qty 1

## 2018-11-14 MED ORDER — FUROSEMIDE 40 MG PO TABS
40.0000 mg | ORAL_TABLET | Freq: Every day | ORAL | Status: DC
  Administered 2018-11-16 – 2018-11-20 (×4): 40 mg via ORAL
  Filled 2018-11-14 (×5): qty 1

## 2018-11-14 MED ORDER — MIRTAZAPINE 15 MG PO TABS
60.0000 mg | ORAL_TABLET | Freq: Every day | ORAL | Status: DC
  Administered 2018-11-14 – 2018-11-19 (×5): 60 mg via ORAL
  Filled 2018-11-14: qty 2
  Filled 2018-11-14: qty 4
  Filled 2018-11-14: qty 2
  Filled 2018-11-14: qty 4
  Filled 2018-11-14: qty 2
  Filled 2018-11-14 (×2): qty 4

## 2018-11-14 MED ORDER — SENNA 8.6 MG PO TABS
1.0000 | ORAL_TABLET | Freq: Two times a day (BID) | ORAL | Status: DC
  Administered 2018-11-15 – 2018-11-20 (×9): 8.6 mg via ORAL
  Filled 2018-11-14 (×10): qty 1

## 2018-11-14 MED ORDER — VITAMIN D 25 MCG (1000 UNIT) PO TABS: 10000.0000 [IU] | ORAL_TABLET | Freq: Every day | ORAL | Status: DC

## 2018-11-14 MED ORDER — CLONAZEPAM 1 MG PO TABS
1.0000 mg | ORAL_TABLET | Freq: Two times a day (BID) | ORAL | Status: DC | PRN
  Administered 2018-11-18: 1 mg via ORAL
  Filled 2018-11-14: qty 1

## 2018-11-14 MED ORDER — SODIUM CHLORIDE 0.9% FLUSH: 3.0000 mL | INTRAVENOUS | Status: DC | PRN

## 2018-11-14 NOTE — ED Notes (Signed)
ED TO INPATIENT HANDOFF REPORT  ED Nurse Name and Phone #:  Chong Sicilian, RN [phone]  S Name/Age/Gender Antonio Oliver 63 y.o. male Room/Bed: 030C/030C  Code Status   Code Status: Full Code  Home/SNF/Other Home Patient oriented to: self, place, time and situation Is this baseline? Yes   Triage Complete: Triage complete  Chief Complaint Weakness; Frequent Falls; Fx L Foot  Triage Note Wife stated, I was suppose to see Dr. Annette Stable and so he sent him here. His symptoms are weakness, loss of Balnace, decrease fine motor skills. He was suppose to have surgery on his cervical spine but all the issues he is having , his symptoms are going really fast. Dr. Annette Stable told us to come here since things are going away fast. He fell before we came here and probably has a broke foot. He also has a broke tibia on the left that happened in January.   Allergies Allergies  Allergen Reactions  . Morphine Itching    Can take with benadry, facial and nose itching     Level of Care/Admitting Diagnosis ED Disposition    ED Disposition Condition [facility]: [place] [[id number]]  Level of Care: Med-Surg [16]  Diagnosis: Cervical myelopathy [facility]) [[id number]]  Admitting Physician: Neuse Forest  Attending Physician: Earnie Larsson [phone]  Estimated length of stay: 3 - 4 days  Certification:: I certify this patient will need inpatient services for at least 2 midnights  Bed request comments: 3w  PT Class (Do Not Modify): Inpatient [101]  PT Acc Code (Do Not Modify): Private [1]       B Medical/Surgery History Past Medical History:  Diagnosis Date  . BPH (benign prostatic hyperplasia)   . Depression   . Diabetes mellitus   . Glaucoma   . Hypertension    Past Surgical History:  Procedure Laterality Date  . BACK SURGERY     x4  . FOOT NEUROMA SURGERY    . GASTRIC BYPASS    . HEMORRHOID SURGERY     over 30 years ago  . HERNIA REPAIR     LIH umb  . NASAL  SINUS SURGERY     x2  . NECK SURGERY       A IV Location/Drains/Wounds Patient Lines/Drains/Airways Status   Active Line/Drains/Airways    Name:   Placement date:   Placement time:   Site:   Days:   Peripheral IV 11/14/18 Right;Posterior Forearm   11/14/18    2115    Forearm   less than 1          Intake/Output Last 24 hours No intake or output data in the 24 hours ending 11/14/18 2159  Labs/Imaging Results for orders placed or performed during the hospital encounter of 11/14/18 (from the past 48 hour(s))  CBC with Differential     Status: Abnormal   Collection Time: 11/14/18  9:26 PM  Result Value Ref Range   WBC 11.2 (H) 4.0 - 10.5 K/uL   RBC 4.44 4.22 - 5.81 MIL/uL   Hemoglobin 10.8 (L) 13.0 - 17.0 g/dL   HCT 36.3 (L) 39.0 - 52.0 %   MCV 81.8 80.0 - 100.0 fL   MCH 24.3 (L) 26.0 - 34.0 pg   MCHC 29.8 (L) 30.0 - 36.0 g/dL   RDW 14.2 11.5 - 15.5 %   Platelets 253 150 - 400 K/uL   nRBC 0.0 0.0 - 0.2 %   Neutrophils Relative % 67 %  Neutro Abs 7.6 1.7 - 7.7 K/uL   Lymphocytes Relative 19 %   Lymphs Abs 2.1 0.7 - 4.0 K/uL   Monocytes Relative 8 %   Monocytes Absolute 0.9 0.1 - 1.0 K/uL   Eosinophils Relative 5 %   Eosinophils Absolute 0.6 (H) 0.0 - 0.5 K/uL   Basophils Relative 1 %   Basophils Absolute 0.1 0.0 - 0.1 K/uL   Immature Granulocytes 0 %   Abs Immature Granulocytes 0.04 0.00 - 0.07 K/uL    Comment: Performed at [facility] [address] [place], [place] [address]  I-Stat Troponin, ED (not at [facility])     Status: None   Collection Time: 11/14/18  9:30 PM  Result Value Ref Range   Troponin i, poc 0.00 0.00 - 0.08 ng/mL   Comment 3            Comment: Due to the release kinetics of cTnI, a negative result within the first hours of the onset of symptoms does not rule out myocardial infarction with certainty. If myocardial infarction is still suspected, repeat the test at appropriate intervals.    Ct Head Wo Contrast  Result Date:  11/14/2018 CLINICAL DATA:  Weakness and loss of balance. EXAM: CT HEAD WITHOUT CONTRAST TECHNIQUE: Contiguous axial images were obtained from the base of the skull through the vertex without intravenous contrast. COMPARISON:  None. FINDINGS: Brain: There is no mass, hemorrhage or extra-axial collection. The size and configuration of the ventricles and extra-axial CSF spaces are normal. The brain parenchyma is normal, without acute or chronic infarction. Vascular: No abnormal hyperdensity of the major intracranial arteries or dural venous sinuses. No intracranial atherosclerosis. Skull: The visualized skull base, calvarium and extracranial soft tissues are normal. Sinuses/Orbits: No fluid levels or advanced mucosal thickening of the visualized paranasal sinuses. No mastoid or middle ear effusion. The orbits are normal. IMPRESSION: Normal brain. Electronically Signed   By: Ulyses Jarred M.D.   On: 11/14/2018 20:12   Dg Foot Complete Left  Result Date: 11/14/2018 CLINICAL DATA:  Fall today. EXAM: LEFT FOOT - COMPLETE 3+ VIEW COMPARISON:  Left foot radiographs 09/20/2018. Left lower extremity CT 09/20/2018. FINDINGS: The bones are diffusely osteopenic. There is a nondisplaced fracture of the medial cuneiform, not evident on the prior radiographs. Moderate soft tissue swelling is noted throughout the foot. There is mild spurring in the dorsal midfoot and talus, and a diminutive plantar calcaneal enthesophyte is noted. A comminuted fracture of the distal tibia is again partially visualized status post intramedullary nail and screw fixation. IMPRESSION: Nondisplaced medial cuneiform fracture. Electronically Signed   By: Logan Bores M.D.   On: 11/14/2018 20:39    Pending Labs Unresulted Labs (From admission, onward)    Start     Ordered   11/14/18 2144  HIV antibody (Routine Testing)  Once,   R     11/14/18 2146   11/14/18 1912  Comprehensive metabolic panel  ONCE - STAT,   STAT     11/14/18 1912   11/14/18  1912  Urinalysis, Routine w reflex microscopic  Once,   R     11/14/18 1912          Vitals/Pain Today's Vitals   11/14/18 1930 11/14/18 2030 11/14/18 2105 11/14/18 2130  BP: (!) 166/102 (!) 164/102  (!) 156/105  Pulse: 89 99  (!) 107  Resp: 19 15    Temp:      TempSrc:      SpO2: 99% 99%  100%  Weight:      Height:      PainSc:   9      Isolation Precautions No active isolations  Medications Medications  atorvastatin (LIPITOR) tablet 20 mg (has no administration in time range)  furosemide (LASIX) tablet 40 mg (has no administration in time range)  lisinopril (PRINIVIL,ZESTRIL) tablet 5 mg (has no administration in time range)  buPROPion (WELLBUTRIN SR) 12 hr tablet 200 mg (has no administration in time range)  busPIRone (BUSPAR) tablet 10 mg (has no administration in time range)  DULoxetine (CYMBALTA) DR capsule 60 mg (has no administration in time range)  mirtazapine (REMERON) tablet 60 mg (has no administration in time range)  levothyroxine (SYNTHROID, LEVOTHROID) tablet 150 mcg (has no administration in time range)  metFORMIN (GLUCOPHAGE) tablet 1,000 mg (has no administration in time range)  pantoprazole (PROTONIX) EC tablet 40 mg (has no administration in time range)  finasteride (PROSCAR) tablet 5 mg (has no administration in time range)  tamsulosin (FLOMAX) capsule 0.4 mg (has no administration in time range)  folic acid (FOLVITE) tablet 1 mg (has no administration in time range)  clonazePAM (KLONOPIN) tablet 1 mg (has no administration in time range)  pramipexole (MIRAPEX) tablet 1 mg (has no administration in time range)  tiZANidine (ZANAFLEX) capsule 4 mg (has no administration in time range)  calcium citrate-vitamin D (CITRACAL+D) 315-200 MG-UNIT per tablet 1 tablet (has no administration in time range)  cholecalciferol (VITAMIN D3) tablet 10,000 Units (has no administration in time range)  Magnesium CAPS 500 mg (has no administration in time range)   multivitamin capsule 1 capsule (has no administration in time range)  potassium chloride (KLOR-CON) packet 20 mEq (has no administration in time range)  loratadine (CLARITIN) tablet 10 mg (has no administration in time range)  fluticasone (FLONASE) 50 MCG/ACT nasal spray 2 spray (has no administration in time range)  sodium chloride flush (NS) 0.9 % injection 3 mL (has no administration in time range)  sodium chloride flush (NS) 0.9 % injection 3 mL (has no administration in time range)  0.9 %  sodium chloride infusion (has no administration in time range)  acetaminophen (TYLENOL) tablet 650 mg (has no administration in time range)    Or  acetaminophen (TYLENOL) suppository 650 mg (has no administration in time range)  senna (SENOKOT) tablet 8.6 mg (has no administration in time range)  insulin aspart (novoLOG) injection 0-15 Units (has no administration in time range)  oxyCODONE-acetaminophen (PERCOCET/ROXICET) 5-325 MG per tablet 1 tablet (has no administration in time range)    And  oxyCODONE (Oxy IR/ROXICODONE) immediate release tablet 5 mg (has no administration in time range)  oxyCODONE-acetaminophen (PERCOCET/ROXICET) 5-325 MG per tablet 2 tablet (2 tablets Oral Given 11/14/18 2103)  diphenhydrAMINE (BENADRYL) injection 12.5 mg (12.5 mg Intravenous Given 11/14/18 2138)    Mobility walks with device High fall risk   Focused Assessments none   R Recommendations: See Admitting Provider Note  Report given to:   Additional Notes:

## 2018-11-14 NOTE — ED Notes (Addendum)
This RN attempted to gain IV access x1. Millie RN attempted x2. Patient tolerated both attempts well and agrees to IV team attempting to gain access.

## 2018-11-14 NOTE — H&P (Signed)
Antonio Oliver is an 63 y.o. male.   Chief Complaint: Weakness HPI: 62 year old male with a very remote history of a C6-7 anterior cervical discectomy and fusion presents with increasing neck pain, bilateral upper extremity numbness and progressive weakness.  Patient also notes gait instability and poor balance.  He suffered a number of falls.  He recently fell and injured his foot.  X-rays reveal a midfoot fracture.  The patient has a CT scan demonstrating evidence of multilevel spondylosis but no definite spinal cord compression is clearly visible.  He cannot have an MRI scan secondary to a previously placed pacemaker.  He is being admitted for work-up of his presumed worsening cervical myelopathy.  He is having no bowel or bladder dysfunction.  Past Medical History:  Diagnosis Date  . BPH (benign prostatic hyperplasia)   . Depression   . Diabetes mellitus   . Glaucoma   . Hypertension     Past Surgical History:  Procedure Laterality Date  . BACK SURGERY     x4  . FOOT NEUROMA SURGERY    . GASTRIC BYPASS    . HEMORRHOID SURGERY     over 30 years ago  . HERNIA REPAIR     LIH umb  . NASAL SINUS SURGERY     x2  . NECK SURGERY      Family History  Problem Relation Age of Onset  . Cancer Mother        breast  . Heart disease Father    Social History:  reports that he has never smoked. He has never used smokeless tobacco. He reports current alcohol use. He reports that he does not use drugs.  Allergies:  Allergies  Allergen Reactions  . Morphine Itching    Can take with benadry, facial and nose itching     (Not in a hospital admission)   No results found for this or any previous visit (from the past 48 hour(s)). Ct Head Wo Contrast  Result Date: 11/14/2018 CLINICAL DATA:  Weakness and loss of balance. EXAM: CT HEAD WITHOUT CONTRAST TECHNIQUE: Contiguous axial images were obtained from the base of the skull through the vertex without intravenous contrast. COMPARISON:   None. FINDINGS: Brain: There is no mass, hemorrhage or extra-axial collection. The size and configuration of the ventricles and extra-axial CSF spaces are normal. The brain parenchyma is normal, without acute or chronic infarction. Vascular: No abnormal hyperdensity of the major intracranial arteries or dural venous sinuses. No intracranial atherosclerosis. Skull: The visualized skull base, calvarium and extracranial soft tissues are normal. Sinuses/Orbits: No fluid levels or advanced mucosal thickening of the visualized paranasal sinuses. No mastoid or middle ear effusion. The orbits are normal. IMPRESSION: Normal brain. Electronically Signed   By: Antonio Oliver M.D.   On: 11/14/2018 20:12   Dg Foot Complete Left  Result Date: 11/14/2018 CLINICAL DATA:  Fall today. EXAM: LEFT FOOT - COMPLETE 3+ VIEW COMPARISON:  Left foot radiographs 09/20/2018. Left lower extremity CT 09/20/2018. FINDINGS: The bones are diffusely osteopenic. There is a nondisplaced fracture of the medial cuneiform, not evident on the prior radiographs. Moderate soft tissue swelling is noted throughout the foot. There is mild spurring in the dorsal midfoot and talus, and a diminutive plantar calcaneal enthesophyte is noted. A comminuted fracture of the distal tibia is again partially visualized status post intramedullary nail and screw fixation. IMPRESSION: Nondisplaced medial cuneiform fracture. Electronically Signed   By: Antonio Oliver M.D.   On: 11/14/2018 20:39    Pertinent  items noted in HPI and remainder of comprehensive ROS otherwise negative.  Blood pressure (!) 164/102, pulse 99, temperature 98.3 F (36.8 C), temperature source Oral, resp. rate 15, height 6\' 3"  (1.905 m), weight 96.6 kg, SpO2 99 %.  Patient is awake and alert.  He is oriented and appropriate.  His speech is fluent fluent.  His judgment and insight appear intact.  Cranial nerve function is normal bilaterally.  Motor examination reveals diffuse weakness of both  upper and lower extremities.  He has significant intrinsic and grip weakness bilaterally.  His proximal upper extremity strength is a little better.  His legs are mildly spastic.  Sensory examination has patchy distal sensory loss in both upper and lower extremities.  Reflexes are normal in his patella biceps and triceps his Achilles reflexes are absent.  Examination his neck reveals no obvious bony abnormality.  Anterior neck is normal.  Airway is normal.  Carotid pulses normal bilaterally.  Examination head ears eyes nose and throat is unremarked.  Chest and abdomen are benign.  Extremity with swelling in his distal left midfoot.  No obvious bony abnormality. Assessment/Plan Patient has signs and symptoms worrisome for progressive cervical myelopathy.  He requires more in-depth imaging to better evaluate this.  I will admit him and arrange for a cervical myelogram and CT scan to be performed.  With regard to his foot fracture orthopedics is being consulted by the emergency department physician.  Antonio Oliver Antonio Oliver 11/14/2018, 9:36 PM

## 2018-11-14 NOTE — ED Triage Notes (Signed)
Wife stated, I was suppose to see Dr. Annette Stable and so he sent him here. His symptoms are weakness, loss of Balnace, decrease fine motor skills. He was suppose to have surgery on his cervical spine but all the issues he is having , his symptoms are going really fast. Dr. Annette Stable told us to come here since things are going away fast. He fell before we came here and probably has a broke foot. He also has a broke tibia on the left that happened in January.

## 2018-11-14 NOTE — ED Notes (Signed)
Patient transported to X-ray 

## 2018-11-14 NOTE — ED Provider Notes (Signed)
[facility] EMERGENCY DEPARTMENT Provider Note   CSN: [id number] Arrival date & time: 11/14/18  1709    History   Chief Complaint Chief Complaint  Patient presents with  . Weakness    HPI Antonio Oliver is a 63 y.o. male.     63 y.o male with an extensive past medical history of diabetes, hyperlipidemia, hypertension presents to the ED with a chief complaint of weakness.  Patient is currently followed by Antonio Oliver who has been seeing him for his weakness along with loss in motor skills for the past couple of weeks.  According to Antonio Oliver patient had a CT 10 days ago which was normal.  She reports patient has had loss of motor skills, she states these changes have increased in time, she reports 3 weeks ago changes were opening between 5 to 7 days, now this past week she reports changes are happening daily.  Patient has gone from ambulating to being unable to perform his ADLs, he is unable to get dressed, brushes teeth, feed himself.  Antonio Oliver also reports patient had a fall, stating that he landed mostly on his left foot, there is swelling to the foot and she thinks it might be broken.  Patient had an appointment with Antonio Oliver tomorrow, he was advised to be seen in the ED today due to rapid changes in his motor skills.  Antonio Oliver denies any other symptoms at this time.  Patient denies any chest pain, shortness of breath, does endorse some calf tenderness along the left leg.  Patient had a tib-fib fractured fixated in January.  No fevers reported.     Past Medical History:  Diagnosis Date  . BPH (benign prostatic hyperplasia)   . Depression   . Diabetes mellitus   . Glaucoma   . Hypertension     Patient Active Problem List   Diagnosis Date Noted  . Cervical myelopathy ([place]) 11/14/2018  . Diabetes ([place]) 10/30/2018  . Hyperlipidemia 10/30/2018  . Depression 10/30/2018  . Anxiety 10/30/2018  . Vitamin D deficiency 10/30/2018  . Osteoarthritis 10/30/2018  . GERD  (gastroesophageal reflux disease) 10/30/2018  . Fatigue 10/30/2018  . Hypothyroidism 10/30/2018  . Essential hypertension 10/30/2018  . Mixed dyslipidemia 10/30/2018  . Diabetes mellitus due to underlying condition with unspecified complications ([place]) 49/67/5916  . Morbid obesity ([place]) 10/27/2011    Past Surgical History:  Procedure Laterality Date  . BACK SURGERY     x4  . FOOT NEUROMA SURGERY    . GASTRIC BYPASS    . HEMORRHOID SURGERY     over 30 years ago  . HERNIA REPAIR     [facility]  . NASAL SINUS SURGERY     x2  . NECK SURGERY          Home Medications    Prior to Admission medications   Medication Sig Start Date End Date Taking? Authorizing Provider  aspirin 81 MG tablet Take 81 mg by mouth daily.   Yes [provider]  atorvastatin (LIPITOR) 20 MG tablet Take 20 mg by mouth daily at 6 PM.    Yes [provider]  buPROPion (WELLBUTRIN SR) 200 MG 12 hr tablet Take 200 mg by mouth 2 (two) times daily.   Yes [provider]  busPIRone (BUSPAR) 10 MG tablet Take 10 mg by mouth 2 (two) times daily.   Yes [provider]  Calcium Citrate-Vitamin D (CALCIUM CITRATE + PO) Take 1 tablet by mouth daily.  Yes [provider]  celecoxib (CELEBREX) 200 MG capsule Take 200 mg by mouth 2 (two) times daily.   Yes [provider]  Cholecalciferol (VITAMIN D PO) Take 1,000 Units by mouth daily.    Yes [provider]  clonazePAM (KLONOPIN) 1 MG tablet Take 1 mg by mouth 2 (two) times daily as needed for anxiety.    Yes [provider]  DULoxetine (CYMBALTA) 60 MG capsule Take 60 mg by mouth daily.   Yes [provider]  fexofenadine (ALLEGRA) 180 MG tablet Take 180 mg by mouth daily.   Yes [provider]  finasteride (PROSCAR) 5 MG tablet Take 5 mg by mouth daily.   Yes [provider]  folic acid (FOLVITE) 1 MG tablet Take 1 mg by mouth daily.   Yes [provider]    furosemide (LASIX) 40 MG tablet Take 40 mg by mouth daily.   Yes [provider]  levothyroxine (SYNTHROID, LEVOTHROID) 150 MCG tablet Take 150 mcg by mouth daily.   Yes [provider]  lisinopril (PRINIVIL,ZESTRIL) 5 MG tablet Take 5 mg by mouth daily.   Yes [provider]  Magnesium 500 MG CAPS Take 500 mg by mouth daily.    Yes [provider]  metFORMIN (GLUCOPHAGE) 1000 MG tablet Take 1,000 mg by mouth 2 (two) times daily with a meal.   Yes [provider]  mirtazapine (REMERON) 30 MG tablet Take 60 mg by mouth at bedtime.   Yes [provider]  Multiple Vitamin (MULTIVITAMIN) capsule Take 1 capsule by mouth daily.   Yes [provider]  omeprazole (PRILOSEC) 20 MG capsule Take 20 mg by mouth daily.   Yes [provider]  oxyCODONE-acetaminophen (PERCOCET) 10-325 MG tablet Take 1 tablet by mouth every 4 (four) hours as needed for pain.   Yes [provider]  potassium chloride (KLOR-CON) 20 MEQ packet Take 20 mEq by mouth 2 (two) times daily.   Yes [provider]  pramipexole (MIRAPEX) 1 MG tablet Take 1 mg by mouth 3 (three) times daily.   Yes [provider]  silodosin (RAPAFLO) 8 MG CAPS capsule Take 8 mg by mouth daily with breakfast.   Yes [provider]  SUMAtriptan (IMITREX) 6 MG/0.5ML SOLN injection Inject 6 mg into the skin every 2 (two) hours as needed for migraine or headache. May repeat in 2 hours if headache persists or recurs.   Yes [provider]  tiZANidine (ZANAFLEX) 4 MG capsule Take 4 mg by mouth 3 (three) times daily.   Yes [provider]  fluticasone (FLONASE) 50 MCG/ACT nasal spray Place 2 sprays into the nose daily.    [provider]    Family History Family History  Problem Relation Age of Onset  . Cancer Mother        breast  . Heart disease Father     Social History Social History   Tobacco Use  . Smoking status:  Never Smoker  . Smokeless tobacco: Never Used  Substance Use Topics  . Alcohol use: Yes    Comment: occ.  . Drug use: No     Allergies   Morphine   Review of Systems Review of Systems  Constitutional: Negative for chills and fever.  HENT: Negative for ear pain and sore throat.   Eyes: Negative for pain and visual disturbance.  Respiratory: Negative for cough and shortness of breath.   Cardiovascular: Negative for chest pain and palpitations.  Gastrointestinal: Negative  for abdominal pain and vomiting.  Genitourinary: Negative for dysuria and hematuria.  Musculoskeletal: Negative for arthralgias and back pain.  Skin: Positive for color change and wound. Negative for rash.  Neurological: Positive for weakness. Negative for seizures and syncope.  All other systems reviewed and are negative.    Physical Exam Updated Vital Signs BP (!) 156/105   Pulse (!) 107   Temp 98.3 F (36.8 C) (Oral)   Resp 15   Ht 6\' 3"  (1.905 m)   Wt 96.6 kg   SpO2 100%   BMI 26.62 kg/m   Physical Exam Vitals signs and nursing note reviewed.  Constitutional:      Appearance: He is well-developed. He is ill-appearing.  HENT:     Head: Normocephalic and atraumatic.  Eyes:     General: No scleral icterus.    Pupils: Pupils are equal, round, and reactive to light.  Neck:     Musculoskeletal: Normal range of motion.  Cardiovascular:     Pulses:          Dorsalis pedis pulses are 2+ on the left side.       Posterior tibial pulses are 2+ on the left side.     Heart sounds: Normal heart sounds.  Pulmonary:     Effort: Pulmonary effort is normal.     Breath sounds: Normal breath sounds. No wheezing.  Chest:     Chest wall: No tenderness.  Abdominal:     General: Bowel sounds are normal. There is no distension.     Palpations: Abdomen is soft.     Tenderness: There is no abdominal tenderness.  Musculoskeletal:        General: No deformity.     Left lower leg: He exhibits tenderness and  swelling.       Legs:     Left foot: Decreased range of motion.  Feet:     Comments: Left foot appear swollen.  Pulses present, sensation is intact. Skin:    General: Skin is warm and dry.  Neurological:     Mental Status: He is alert and oriented to person, place, and time.     Comments: 3/5 Strength is diminished to bilateral upper extremities.  Patient weaker with hand flexion, able to extend hand fully.  Sensation to upper extremities remains intact with reassuring pulses.  4/5 strength to bilateral lower extremities.  Pain with dorsiflexion of left foot, swelling noted to the area.      ED Treatments / Results  Labs (all labs ordered are listed, but only abnormal results are displayed) Labs Reviewed  CBC WITH DIFFERENTIAL/PLATELET - Abnormal; Notable for the following components:      Result Value   WBC 11.2 (*)    Hemoglobin 10.8 (*)    HCT 36.3 (*)    MCH 24.3 (*)    MCHC 29.8 (*)    Eosinophils Absolute 0.6 (*)    All other components within normal limits  URINALYSIS, ROUTINE W REFLEX MICROSCOPIC - Abnormal; Notable for the following components:   Hgb urine dipstick LARGE (*)    RBC / HPF >50 (*)    Bacteria, UA RARE (*)    All other components within normal limits  COMPREHENSIVE METABOLIC PANEL  HIV ANTIBODY (ROUTINE TESTING W REFLEX)  I-STAT TROPONIN, ED    EKG EKG Interpretation  Date/Time:  Tuesday November 14 2018 20:52:59 EDT Ventricular Rate:  100 PR Interval:    QRS Duration: 90 QT Interval:  348 QTC Calculation: 449 R  Axis:   -23 Text Interpretation:  Sinus tachycardia LVH with secondary repolarization abnormality Since last tracing rate faster Confirmed by Noemi Chapel [phone]) on 11/14/2018 8:56:34 PM   Radiology Ct Head Wo Contrast  Result Date: 11/14/2018 CLINICAL DATA:  Weakness and loss of balance. EXAM: CT HEAD WITHOUT CONTRAST TECHNIQUE: Contiguous axial images were obtained from the base of the skull through the vertex without intravenous  contrast. COMPARISON:  None. FINDINGS: Brain: There is no mass, hemorrhage or extra-axial collection. The size and configuration of the ventricles and extra-axial CSF spaces are normal. The brain parenchyma is normal, without acute or chronic infarction. Vascular: No abnormal hyperdensity of the major intracranial arteries or dural venous sinuses. No intracranial atherosclerosis. Skull: The visualized skull base, calvarium and extracranial soft tissues are normal. Sinuses/Orbits: No fluid levels or advanced mucosal thickening of the visualized paranasal sinuses. No mastoid or middle ear effusion. The orbits are normal. IMPRESSION: Normal brain. Electronically Signed   By: Ulyses Jarred M.D.   On: 11/14/2018 20:12   Dg Foot Complete Left  Result Date: 11/14/2018 CLINICAL DATA:  Fall today. EXAM: LEFT FOOT - COMPLETE 3+ VIEW COMPARISON:  Left foot radiographs 09/20/2018. Left lower extremity CT 09/20/2018. FINDINGS: The bones are diffusely osteopenic. There is a nondisplaced fracture of the medial cuneiform, not evident on the prior radiographs. Moderate soft tissue swelling is noted throughout the foot. There is mild spurring in the dorsal midfoot and talus, and a diminutive plantar calcaneal enthesophyte is noted. A comminuted fracture of the distal tibia is again partially visualized status post intramedullary nail and screw fixation. IMPRESSION: Nondisplaced medial cuneiform fracture. Electronically Signed   By: Logan Bores M.D.   On: 11/14/2018 20:39    Procedures Procedures (including critical care time)  Medications Ordered in ED Medications  atorvastatin (LIPITOR) tablet 20 mg (has no administration in time range)  furosemide (LASIX) tablet 40 mg (has no administration in time range)  lisinopril (PRINIVIL,ZESTRIL) tablet 5 mg (has no administration in time range)  buPROPion (WELLBUTRIN SR) 12 hr tablet 200 mg (has no administration in time range)  busPIRone (BUSPAR) tablet 10 mg (has no  administration in time range)  DULoxetine (CYMBALTA) DR capsule 60 mg (has no administration in time range)  mirtazapine (REMERON) tablet 60 mg (has no administration in time range)  levothyroxine (SYNTHROID, LEVOTHROID) tablet 150 mcg (has no administration in time range)  metFORMIN (GLUCOPHAGE) tablet 1,000 mg (has no administration in time range)  pantoprazole (PROTONIX) EC tablet 40 mg (has no administration in time range)  finasteride (PROSCAR) tablet 5 mg (has no administration in time range)  tamsulosin (FLOMAX) capsule 0.4 mg (has no administration in time range)  folic acid (FOLVITE) tablet 1 mg (has no administration in time range)  clonazePAM (KLONOPIN) tablet 1 mg (has no administration in time range)  pramipexole (MIRAPEX) tablet 1 mg (has no administration in time range)  tiZANidine (ZANAFLEX) tablet 4 mg (has no administration in time range)  calcium citrate-vitamin D (CITRACAL+D) 315-200 MG-UNIT per tablet 1 tablet (has no administration in time range)  magnesium oxide (MAG-OX) tablet 400 mg (has no administration in time range)  multivitamin capsule 1 capsule (has no administration in time range)  potassium chloride (KLOR-CON) packet 20 mEq (has no administration in time range)  loratadine (CLARITIN) tablet 10 mg (has no administration in time range)  fluticasone (FLONASE) 50 MCG/ACT nasal spray 2 spray (has no administration in time range)  sodium chloride flush (NS) 0.9 % injection 3 mL (has  no administration in time range)  sodium chloride flush (NS) 0.9 % injection 3 mL (has no administration in time range)  0.9 %  sodium chloride infusion (has no administration in time range)  acetaminophen (TYLENOL) tablet 650 mg (has no administration in time range)    Or  acetaminophen (TYLENOL) suppository 650 mg (has no administration in time range)  senna (SENOKOT) tablet 8.6 mg (has no administration in time range)  insulin aspart (novoLOG) injection 0-15 Units (has no  administration in time range)  oxyCODONE-acetaminophen (PERCOCET/ROXICET) 5-325 MG per tablet 1 tablet (has no administration in time range)    And  oxyCODONE (Oxy IR/ROXICODONE) immediate release tablet 5 mg (has no administration in time range)  cholecalciferol (VITAMIN D3) tablet 1,000 Units (has no administration in time range)  oxyCODONE-acetaminophen (PERCOCET/ROXICET) 5-325 MG per tablet 2 tablet (2 tablets Oral Given 11/14/18 2103)  diphenhydrAMINE (BENADRYL) injection 12.5 mg (12.5 mg Intravenous Given 11/14/18 2138)     Initial Impression / Assessment and Plan / ED Course  I have reviewed the triage vital signs and the nursing notes.  Pertinent labs & imaging results that were available during my care of the patient were reviewed by me and considered in my medical decision making (see chart for details).       Patient with an extensive medical history of hypertension, hyperlipidemia presents to the ED with weakness.  She is currently followed by Antonio Oliver, he did have prior surgery to his neck, back.  Did have a CT C-spine 10 days ago which was normal.  Antonio Oliver reports patient has been significantly weaker in the past week, reports he is unable to hold his toothbrush, feed himself, change himself.  She does have an appointment with Antonio Oliver tomorrow but reports they call them prior to coming in and were instructed to be seen in the emergency department.  During evaluation patient has significantly weaker on upper extremities, his strength is diminished 3/5 bilaterally.  CT head without contrast was ordered to rule out any infarct, hemorrhage.  CT head showed no acute process at this time.  He does endorse a fall today prior to arrival, will obtain left foot x-ray to rule out any other injury.  Left foot x-ray showed: Nondisplaced medial cuneiform fracture. Will place call for orthopedics Antonio Oliver for his recommendations.  8:22 PM spoke to [facility] NP for Antonio Oliver who advised she will discuss  with him for his recommendations at this time.  She reports they were unable to obtain MRI imaging due to patient having a pacemaker that is not compatible with MRI. Antonio Oliver to see patient in the ED.   9:30 PM patient was seen and evaluated by Antonio Oliver who will admit patient.  Pending orthopedic recommendations for patient's fracture.  Will inform Antonio Oliver at the bedside of process.  Patient was provided with Percocet p.o., requesting Toradol, no creatinine level has returned at this time.  Unable to provide for patient.   10:03 PM spoke to Antonio Oliver who recommended patient be placed in a postop shoe.  Will provide this for patient at this time.  Patient pending admission by Antonio Oliver.  Final Clinical Impressions(s) / ED Diagnoses   Final diagnoses:  Weakness    ED Discharge Orders    None       Janeece Fitting, Hershal Coria 11/14/18 2203    Noemi Chapel, MD 11/15/18 1025

## 2018-11-15 ENCOUNTER — Inpatient Hospital Stay (HOSPITAL_COMMUNITY): Payer: Medicare Other

## 2018-11-15 ENCOUNTER — Encounter (HOSPITAL_COMMUNITY): Payer: Self-pay | Admitting: Radiology

## 2018-11-15 ENCOUNTER — Other Ambulatory Visit: Payer: Self-pay

## 2018-11-15 ENCOUNTER — Other Ambulatory Visit: Payer: Self-pay | Admitting: Neurosurgery

## 2018-11-15 DIAGNOSIS — S92245A Nondisplaced fracture of medial cuneiform of left foot, initial encounter for closed fracture: Secondary | ICD-10-CM

## 2018-11-15 DIAGNOSIS — W1840XA Slipping, tripping and stumbling without falling, unspecified, initial encounter: Secondary | ICD-10-CM

## 2018-11-15 LAB — GLUCOSE, CAPILLARY
Glucose-Capillary: 130 mg/dL — ABNORMAL HIGH (ref 70–99)
Glucose-Capillary: 157 mg/dL — ABNORMAL HIGH (ref 70–99)
Glucose-Capillary: 218 mg/dL — ABNORMAL HIGH (ref 70–99)

## 2018-11-15 LAB — HIV ANTIBODY (ROUTINE TESTING W REFLEX): HIV Screen 4th Generation wRfx: NONREACTIVE

## 2018-11-15 MED ORDER — IOPAMIDOL (ISOVUE-M 300) INJECTION 61%
15.0000 mL | Freq: Once | INTRAMUSCULAR | Status: AC | PRN
  Administered 2018-11-15: 10 mL via INTRATHECAL

## 2018-11-15 MED ORDER — LIDOCAINE HCL (PF) 1 % IJ SOLN
5.0000 mL | Freq: Once | INTRAMUSCULAR | Status: AC
  Administered 2018-11-15: 5 mL via INTRADERMAL

## 2018-11-15 NOTE — Progress Notes (Signed)
Orthopedic Tech Progress Note Patient Details:  Antonio Oliver May 25, 1956 [id number]  Ortho Devices Type of Ortho Device: Postop shoe/boot Ortho Device/Splint Interventions: Application   Post Interventions Patient Tolerated: Well Instructions Provided: Care of device   [place] Pink 11/15/2018, 12:04 PM

## 2018-11-15 NOTE — Consult Note (Signed)
Patient ID: Antonio Oliver MRN: [id number] DOB/AGE: October 16, 1955 63 y.o.  Admit date: 11/14/2018  Admission Diagnoses:  Active Problems:   Cervical myelopathy (HCC)   HPI: Patient is being seen for right foot pain.  Patient admitted to neurosurgery service for progressive upper and lower extremity weakness with unsteady gait.  States that yesterday he tripped walking through a doorway and lost his footing.  Complaint of left midfoot pain.  Was seen in ER and x-ray showed a nondisplaced medial cuneiform fracture.  Patient is also status post comminuted left tibial fracture that required ORIF procedure by a physician in [place] January 2020.  Was recently seen by his surgeon and scheduled to follow-up in 6 weeks.  Patient also complaining of mid to distal tibia pain.  Tib-fib x-rays not performed in the emergency room.  He states that his balance issues have been progressively getting worse over the last few weeks.  Scheduled to have imaging per Dr. Trenton Gammon this afternoon.  Past Medical History: Past Medical History:  Diagnosis Date  . BPH (benign prostatic hyperplasia)   . Depression   . Diabetes mellitus   . Glaucoma   . Hypertension     Surgical History: Past Surgical History:  Procedure Laterality Date  . BACK SURGERY     x4  . FOOT NEUROMA SURGERY    . GASTRIC BYPASS    . HEMORRHOID SURGERY     over 30 years ago  . HERNIA REPAIR     LIH umb  . NASAL SINUS SURGERY     x2  . NECK SURGERY      Family History: Family History  Problem Relation Age of Onset  . Cancer Mother        breast  . Heart disease Father     Social History: Social History   Socioeconomic History  . Marital status: Married    Spouse name: Not on file  . Number of children: Not on file  . Years of education: Not on file  . Highest education level: Not on file  Occupational History  . Not on file  Social Needs  . Financial resource strain: Not on file  . Food insecurity:    Worry: Not  on file    Inability: Not on file  . Transportation needs:    Medical: Not on file    Non-medical: Not on file  Tobacco Use  . Smoking status: Never Smoker  . Smokeless tobacco: Never Used  Substance and Sexual Activity  . Alcohol use: Yes    Comment: occ.  . Drug use: No  . Sexual activity: Not on file  Lifestyle  . Physical activity:    Days per week: Not on file    Minutes per session: Not on file  . Stress: Not on file  Relationships  . Social connections:    Talks on phone: Not on file    Gets together: Not on file    Attends religious service: Not on file    Active member of club or organization: Not on file    Attends meetings of clubs or organizations: Not on file    Relationship status: Not on file  . Intimate partner violence:    Fear of current or ex partner: Not on file    Emotionally abused: Not on file    Physically abused: Not on file    Forced sexual activity: Not on file  Other Topics Concern  . Not on file  Social History  Narrative  . Not on file    Allergies: Morphine  Medications: I have reviewed the patient's current medications.  Vital Signs: Patient Vitals for the past 24 hrs:  BP Temp Temp src Pulse Resp SpO2 Height Weight  11/15/18 1154 (!) 153/99 98.6 F (37 C) Oral 91 16 96 % - -  11/15/18 0731 (!) 156/102 98.3 F (36.8 C) Oral 96 18 95 % - -  11/15/18 0425 (!) 160/90 98.1 F (36.7 C) Oral 96 20 99 % - -  11/15/18 0034 133/90 98.5 F (36.9 C) Oral 100 (!) 22 96 % - -  11/14/18 2306 140/83 98.4 F (36.9 C) Oral (!) 109 (!) 22 98 % - -  11/14/18 2200 (!) 151/95 - - (!) 104 20 95 % - -  11/14/18 2130 (!) 156/105 - - (!) 107 - 100 % - -  11/14/18 2030 (!) 164/102 - - 99 15 99 % - -  11/14/18 1930 (!) 166/102 - - 89 19 99 % - -  11/14/18 1845 (!) 163/99 - - 95 (!) 21 99 % - -  11/14/18 1735 (!) 155/105 98.3 F (36.8 C) Oral 84 16 100 % 6\' 3"  (1.905 m) 96.6 kg    Radiology: Ct Head Wo Contrast  Result Date: 11/14/2018 CLINICAL  DATA:  Weakness and loss of balance. EXAM: CT HEAD WITHOUT CONTRAST TECHNIQUE: Contiguous axial images were obtained from the base of the skull through the vertex without intravenous contrast. COMPARISON:  None. FINDINGS: Brain: There is no mass, hemorrhage or extra-axial collection. The size and configuration of the ventricles and extra-axial CSF spaces are normal. The brain parenchyma is normal, without acute or chronic infarction. Vascular: No abnormal hyperdensity of the major intracranial arteries or dural venous sinuses. No intracranial atherosclerosis. Skull: The visualized skull base, calvarium and extracranial soft tissues are normal. Sinuses/Orbits: No fluid levels or advanced mucosal thickening of the visualized paranasal sinuses. No mastoid or middle ear effusion. The orbits are normal. IMPRESSION: Normal brain. Electronically Signed   By: Ulyses Jarred M.D.   On: 11/14/2018 20:12   Ct Cervical Spine Wo Contrast  Result Date: 11/06/2018 CLINICAL DATA:  Neck pain and right greater than left arm numbness for the past 6 weeks. EXAM: CT CERVICAL SPINE WITHOUT CONTRAST TECHNIQUE: Multidetector CT imaging of the cervical spine was performed without intravenous contrast. Multiplanar CT image reconstructions were also generated. COMPARISON:  Cervical spine x-rays dated March 22, 2011. FINDINGS: Alignment: Mild levoscoliosis. New facet mediated 3 mm anterolisthesis at C2-C3, C4-C5, and C5-C6. Unchanged 2 mm anterolisthesis at C7-T1. Skull base and vertebrae: Prior C6-C7 interbody fusion. No acute fracture. No primary bone lesion or focal pathologic process. Soft tissues and spinal canal: No prevertebral fluid or swelling. No visible canal hematoma. Disc levels: C2-C3: Disc bulging with moderate right uncovertebral hypertrophy and bilateral facet arthropathy. Mild right neuroforaminal stenosis. C3-C4: Moderate right uncovertebral hypertrophy. Severe bilateral facet arthropathy. Moderate bilateral neuroforaminal  stenosis. C4-C5: Mild disc bulging with moderate right uncovertebral hypertrophy and severe bilateral facet arthropathy. Moderate bilateral neuroforaminal stenosis. C5-C6: Disc uncovering without significant disc bulging. Mild bilateral uncovertebral hypertrophy. Severe right and moderate left facet arthropathy. Mild bilateral neuroforaminal stenosis. C6-C7:  Prior interbody fusion.  No stenosis. C7-T1:  Severe bilateral facet arthropathy.  No stenosis. Upper chest: Negative. Other: None. IMPRESSION: 1. Progressive multilevel severe facet arthropathy and mild spondylolisthesis. Moderate bilateral neuroforaminal stenosis at C3-C4 and C4-C5. 2. Prior interbody fusion at C6-C7 without stenosis. Electronically Signed   By: Gwyndolyn Saxon  Marzella Schlein M.D.   On: 11/06/2018 12:48   Dg Foot Complete Left  Result Date: 11/14/2018 CLINICAL DATA:  Fall today. EXAM: LEFT FOOT - COMPLETE 3+ VIEW COMPARISON:  Left foot radiographs 09/20/2018. Left lower extremity CT 09/20/2018. FINDINGS: The bones are diffusely osteopenic. There is a nondisplaced fracture of the medial cuneiform, not evident on the prior radiographs. Moderate soft tissue swelling is noted throughout the foot. There is mild spurring in the dorsal midfoot and talus, and a diminutive plantar calcaneal enthesophyte is noted. A comminuted fracture of the distal tibia is again partially visualized status post intramedullary nail and screw fixation. IMPRESSION: Nondisplaced medial cuneiform fracture. Electronically Signed   By: Logan Bores M.D.   On: 11/14/2018 20:39    Labs: Recent Labs    11/14/18 2126  WBC 11.2*  RBC 4.44  HCT 36.3*  PLT 253   Recent Labs    11/14/18 2126  NA 136  K 4.4  CL 106  CO2 23  BUN 8  CREATININE 0.80  GLUCOSE 138*  CALCIUM 9.3   No results for input(s): LABPT, INR in the last 72 hours.   Physical Exam: Body mass index is 26.62 kg/m.  Physical Exam  Constitutional: He is oriented to person, place, and time. No  distress.  HENT:  Head: Normocephalic and atraumatic.  Eyes: Pupils are equal, round, and reactive to light. EOM are normal.  Musculoskeletal:     Comments: Patient has mid to distal left tibial tenderness to palpation.  Patient also is exquisitely tender over the left medial cuneiform.  Some swelling although not extreme.  Neurological: He is alert and oriented to person, place, and time.  Skin: Skin is warm and dry.  Psychiatric: He has a normal mood and affect.     Assessment and Plan: 1.  Left nondisplaced medial cuneiform fracture 2.  Status post ORIF left tibia fracture January 2020.  Dr. Lorin Mercy has reviewed x-rays of the left foot.  We will put patient in a postop shoe.  He can weight-bear as tolerated in this.  With his recent surgery general thousand 20 and pain in the tibia I will get portable tib-fib x-rays this afternoon.  Patient's wife has been concerned with change in gait over the last 3 weeks with the left lower extremity.  Wife states that he has been walking with his left foot externally rotated.  They are concerned that tibia was not fixed correctly.  I did advise them that with my exam that it appears that he has good alignment.  I did tell them that I did not think that the gait pattern is from tibial fixation.  Benjiman Core PA-C For Rodell Perna MD [facility]

## 2018-11-15 NOTE — Plan of Care (Signed)
Patient stable, discussed POC with patient, agreeable with plan, denies question/concerns at this time.  

## 2018-11-15 NOTE — Progress Notes (Signed)
Patient symptoms and exam stable today.  He was able to undergo cervical myelogram and CT scan without difficulty.  Myelogram demonstrates severe multi-level degenerative disease with severe central stenosis worse at C4-5 but also very significant at C3-4 and C5-6.  Patient also with severe neural foraminal stenosis at all 3 levels.  I discussed situation with the patient.  I do believe that he has signs and symptoms of a compressive cervical myelopathy but also has evidence of weakness from cervical radiculopathy as well.  I have discussed the options available including both operative and nonoperative care.  I discussed the possibility of moving forward with a C3-4, C4-5, C5-6 anterior cervical discectomy with interbody fusion utilizing interbody peek cages, locally harvested autograft, and anterior plate instrumentation.  I discussed the risks involved with surgery including but not limited to the risk of anesthesia, bleeding, infection, CSF leak, nerve root injury, spinal cord injury, fusion failure, instrumentation failure, dysphagia, dysphonia, continued pain, and non-benefit.  The patient has been given the opportunity ask questions.  He appears to understand.  He wishes to proceed with surgery.  Plan for surgery Friday afternoon.

## 2018-11-15 NOTE — Progress Notes (Signed)
Orthopedic Tech Progress Note Patient Details:  Antonio Oliver 1956-02-10 [id number]  Ortho Devices Type of Ortho Device: Postop shoe/boot Ortho Device/Splint Interventions: Ordered, Application, Adjustment   Post Interventions Patient Tolerated: Well Instructions Provided: Adjustment of device, Care of device   [name] [name] 11/15/2018, 1:22 PM

## 2018-11-16 DIAGNOSIS — S92245A Nondisplaced fracture of medial cuneiform of left foot, initial encounter for closed fracture: Secondary | ICD-10-CM

## 2018-11-16 HISTORY — DX: Nondisplaced fracture of medial cuneiform of left foot, initial encounter for closed fracture: S92.245A

## 2018-11-16 LAB — GLUCOSE, CAPILLARY
GLUCOSE-CAPILLARY: 152 mg/dL — AB (ref 70–99)
Glucose-Capillary: 182 mg/dL — ABNORMAL HIGH (ref 70–99)
Glucose-Capillary: 141 mg/dL — ABNORMAL HIGH (ref 70–99)

## 2018-11-16 LAB — MRSA PCR SCREENING: MRSA by PCR: NEGATIVE

## 2018-11-16 MED ORDER — CHLORHEXIDINE GLUCONATE CLOTH 2 % EX PADS
6.0000 | MEDICATED_PAD | Freq: Once | CUTANEOUS | Status: AC
  Administered 2018-11-16: 6 via TOPICAL

## 2018-11-16 NOTE — TOC Initial Note (Signed)
Transition of Care [facility]) - Initial/Assessment Note    Patient Details  Name: Antonio Oliver MRN: [id number] Date of Birth: 10/22/1955  [facility]) CM/SW Contact:    [name], RN Phone Number: 11/16/2018, 1:07 PM  Clinical Narrative:                   Expected Discharge Plan: Home/Self Care Barriers to Discharge: Continued Medical Work up(surgery 3/13)   Patient Goals and CMS Choice Patient states their goals for this hospitalization and ongoing recovery are:: to have less pain      Expected Discharge Plan and Services Expected Discharge Plan: Home/Self Care     Living arrangements for the past 2 months: [place]                          Prior Living Arrangements/Services Living arrangements for the past 2 months: Single Family Home Lives with:: Spouse Patient language and need for interpreter reviewed:: Yes(none) Do you feel safe going back to the place where you live?: Yes      Need for Family Participation in Patient Care: Yes (Comment)(spouse)   Current home services: DME(cane, 3 in 1, walker, shower chair, wheelchair) Criminal Activity/Legal Involvement Pertinent to Current Situation/Hospitalization: No - Comment as needed  Activities of Daily Living Home Assistive Devices/Equipment: Cane (specify quad or straight) ADL Screening (condition at time of admission) Patient's cognitive ability adequate to safely complete daily activities?: Yes Is the patient deaf or have difficulty hearing?: No Does the patient have difficulty seeing, even when wearing glasses/contacts?: No Does the patient have difficulty concentrating, remembering, or making decisions?: No Patient able to express need for assistance with ADLs?: Yes Does the patient have difficulty dressing or bathing?: Yes Independently performs ADLs?: No Communication: Independent Dressing (OT): Needs assistance Is this a change from baseline?: Change from baseline, expected to  last >3 days Grooming: Needs assistance Is this a change from baseline?: Change from baseline, expected to last >3 days Feeding: Needs assistance Is this a change from baseline?: Change from baseline, expected to last >3 days Bathing: Needs assistance Is this a change from baseline?: Change from baseline, expected to last >3 days Toileting: Needs assistance Is this a change from baseline?: Change from baseline, expected to last >3days In/Out Bed: Independent Walks in Home: Independent with device (comment)(cane) Does the patient have difficulty walking or climbing stairs?: Yes Weakness of Legs: Both Weakness of Arms/Hands: Both  Permission Sought/Granted                  Emotional Assessment Appearance:: Appears stated age Attitude/Demeanor/Rapport: Gracious Affect (typically observed): Accepting, Appropriate Orientation: : Oriented to Self, Oriented to Place, Oriented to  Time, Oriented to Situation   Psych Involvement: No (comment)  Admission diagnosis:  Cervical myelopathy ([place]) [G95.9] Weakness [R53.1] Patient Active Problem List   Diagnosis Date Noted  . Nondisplaced fracture of medial cuneiform of left foot, initial encounter for closed fracture 11/16/2018  . Cervical myelopathy (Antonio Oliver) 11/14/2018  . Diabetes (Antonio Oliver) 10/30/2018  . Hyperlipidemia 10/30/2018  . Depression 10/30/2018  . Anxiety 10/30/2018  . Vitamin D deficiency 10/30/2018  . Osteoarthritis 10/30/2018  . GERD (gastroesophageal reflux disease) 10/30/2018  . Fatigue 10/30/2018  . Hypothyroidism 10/30/2018  . Essential hypertension 10/30/2018  . Mixed dyslipidemia 10/30/2018  . Diabetes mellitus due to underlying condition with unspecified complications (Antonio Oliver) 42/59/5638  . Morbid obesity (Antonio Oliver) 10/27/2011   PCP:  Cher Nakai,  MD Pharmacy:   [facility], [place] [address] Phone: [phone] Fax: [phone]     Social Determinants  of Health (SDOH) Interventions  Wife able to provide transportation for patient post surgery.  Readmission Risk Interventions 30 Day Unplanned Readmission Risk Score     ED to Hosp-Admission (Current) from 11/14/2018 in [facility]  30 Day Unplanned Readmission Risk Score (%)  10 Filed at 11/16/2018 1200     This score is the patient's risk of an unplanned readmission within 30 days of being discharged (0 -100%). The score is based on dignosis, age, lab data, medications, orders, and past utilization.   Low:  0-14.9   Medium: 15-21.9   High: 22-29.9   Extreme: 30 and above       No flowsheet data found.

## 2018-11-16 NOTE — Progress Notes (Signed)
Overall stable.  No new issues or problems.  Patient tolerated myelogram without L affects.    Examination unchanged.  Still with diffuse weakness in both upper and lower extremities.  Plan for 3 level anterior cervical decompression and fusion tomorrow afternoon.

## 2018-11-16 NOTE — Progress Notes (Signed)
Patient ID: Antonio Oliver, male   DOB: [birth year], 64 y.o.   MRN: [id number]   Patient states that he is doing about the same.  He is scheduled for three-level cervical fusion with Dr. Annette Stable tomorrow.  I reviewed left tib-fib x-ray and report showed: IMPRESSION: 1. Some degree of interval healing since January, at the patient's mildly displaced comminuted fracture of the distal tibia. Some degree of interval healing at the proximal fibular fracture, though the fracture line is still partially evident. 2. Hardware intact.  Patient will follow with Dr. Lorin Mercy 1 to 2 weeks after discharge for recheck of his left foot fracture. Can weight-bear as tolerated in postop shoe.  Follow-up with orthopedic surgeon in [place] to recheck his ORIF tibia.

## 2018-11-17 ENCOUNTER — Encounter (HOSPITAL_COMMUNITY): Payer: Self-pay | Admitting: Certified Registered"

## 2018-11-17 ENCOUNTER — Inpatient Hospital Stay (HOSPITAL_COMMUNITY): Payer: Medicare Other

## 2018-11-17 ENCOUNTER — Inpatient Hospital Stay (HOSPITAL_COMMUNITY): Payer: Medicare Other | Admitting: Anesthesiology

## 2018-11-17 ENCOUNTER — Encounter (HOSPITAL_COMMUNITY)
Admission: EM | Disposition: A | Discharge status: Home Health | Payer: Self-pay | Source: Home / Self Care | Attending: Neurosurgery

## 2018-11-17 HISTORY — PX: ANTERIOR CERVICAL DECOMP/DISCECTOMY FUSION: SHX1161

## 2018-11-17 LAB — TYPE AND SCREEN
ABO/RH(D): O POS
Antibody Screen: NEGATIVE

## 2018-11-17 LAB — GLUCOSE, CAPILLARY
Glucose-Capillary: 147 mg/dL — ABNORMAL HIGH (ref 70–99)
Glucose-Capillary: 148 mg/dL — ABNORMAL HIGH (ref 70–99)
Glucose-Capillary: 173 mg/dL — ABNORMAL HIGH (ref 70–99)
Glucose-Capillary: 217 mg/dL — ABNORMAL HIGH (ref 70–99)
Glucose-Capillary: 309 mg/dL — ABNORMAL HIGH (ref 70–99)

## 2018-11-17 SURGERY — ANTERIOR CERVICAL DECOMPRESSION/DISCECTOMY FUSION 3 LEVELS
Anesthesia: General | Site: Spine Cervical

## 2018-11-17 MED ORDER — ONDANSETRON HCL 4 MG/2ML IJ SOLN: 4.0000 mg | Freq: Four times a day (QID) | INTRAMUSCULAR | Status: DC | PRN

## 2018-11-17 MED ORDER — ALBUMIN HUMAN 5 % IV SOLN
INTRAVENOUS | Status: DC | PRN
  Administered 2018-11-17 (×3): via INTRAVENOUS

## 2018-11-17 MED ORDER — EPHEDRINE 5 MG/ML INJ
INTRAVENOUS | Status: AC
  Filled 2018-11-17: qty 10

## 2018-11-17 MED ORDER — SUCCINYLCHOLINE CHLORIDE 200 MG/10ML IV SOSY
PREFILLED_SYRINGE | INTRAVENOUS | Status: DC | PRN
  Administered 2018-11-17: 120 mg via INTRAVENOUS

## 2018-11-17 MED ORDER — PROMETHAZINE HCL 25 MG/ML IJ SOLN: 6.2500 mg | INTRAMUSCULAR | Status: DC | PRN

## 2018-11-17 MED ORDER — SODIUM CHLORIDE 0.9% FLUSH
3.0000 mL | Freq: Two times a day (BID) | INTRAVENOUS | Status: DC
  Administered 2018-11-18 – 2018-11-20 (×2): 3 mL via INTRAVENOUS

## 2018-11-17 MED ORDER — ACETAMINOPHEN 325 MG PO TABS
650.0000 mg | ORAL_TABLET | Freq: Once | ORAL | Status: AC
  Administered 2018-11-17: 650 mg via ORAL

## 2018-11-17 MED ORDER — ROCURONIUM BROMIDE 50 MG/5ML IV SOSY
PREFILLED_SYRINGE | INTRAVENOUS | Status: AC
  Filled 2018-11-17: qty 10

## 2018-11-17 MED ORDER — CYCLOBENZAPRINE HCL 10 MG PO TABS: 10.0000 mg | ORAL_TABLET | Freq: Three times a day (TID) | ORAL | Status: DC | PRN

## 2018-11-17 MED ORDER — THROMBIN 5000 UNITS EX SOLR
OROMUCOSAL | Status: DC | PRN
  Administered 2018-11-17: 5 mL via TOPICAL

## 2018-11-17 MED ORDER — DEXAMETHASONE SODIUM PHOSPHATE 10 MG/ML IJ SOLN
10.0000 mg | INTRAMUSCULAR | Status: DC
  Filled 2018-11-17: qty 1

## 2018-11-17 MED ORDER — SUGAMMADEX SODIUM 200 MG/2ML IV SOLN
INTRAVENOUS | Status: DC | PRN
  Administered 2018-11-17: 200 mg via INTRAVENOUS

## 2018-11-17 MED ORDER — SCOPOLAMINE 1 MG/3DAYS TD PT72
1.0000 | MEDICATED_PATCH | TRANSDERMAL | Status: DC
  Administered 2018-11-17: 1.5 mg via TRANSDERMAL

## 2018-11-17 MED ORDER — PHENYLEPHRINE 40 MCG/ML (10ML) SYRINGE FOR IV PUSH (FOR BLOOD PRESSURE SUPPORT)
PREFILLED_SYRINGE | INTRAVENOUS | Status: DC | PRN
  Administered 2018-11-17: 200 ug via INTRAVENOUS
  Administered 2018-11-17: 160 ug via INTRAVENOUS
  Administered 2018-11-17: 120 ug via INTRAVENOUS

## 2018-11-17 MED ORDER — LIDOCAINE 2% (20 MG/ML) 5 ML SYRINGE
INTRAMUSCULAR | Status: AC
  Filled 2018-11-17: qty 5

## 2018-11-17 MED ORDER — EPHEDRINE SULFATE-NACL 50-0.9 MG/10ML-% IV SOSY
PREFILLED_SYRINGE | INTRAVENOUS | Status: DC | PRN
  Administered 2018-11-17 (×2): 15 mg via INTRAVENOUS
  Administered 2018-11-17 (×2): 10 mg via INTRAVENOUS

## 2018-11-17 MED ORDER — THROMBIN 20000 UNITS EX SOLR
CUTANEOUS | Status: DC | PRN
  Administered 2018-11-17: 20 mL via TOPICAL

## 2018-11-17 MED ORDER — HYDROCODONE-ACETAMINOPHEN 10-325 MG PO TABS
2.0000 | ORAL_TABLET | ORAL | Status: DC | PRN
  Administered 2018-11-17 – 2018-11-20 (×4): 2 via ORAL
  Filled 2018-11-17 (×4): qty 2

## 2018-11-17 MED ORDER — FENTANYL CITRATE (PF) 250 MCG/5ML IJ SOLN
INTRAMUSCULAR | Status: AC
  Filled 2018-11-17: qty 5

## 2018-11-17 MED ORDER — PHENOL 1.4 % MT LIQD: 1.0000 | OROMUCOSAL | Status: DC | PRN

## 2018-11-17 MED ORDER — ACETAMINOPHEN 650 MG RE SUPP: 650.0000 mg | RECTAL | Status: DC | PRN

## 2018-11-17 MED ORDER — ACETAMINOPHEN 325 MG PO TABS: 650.0000 mg | ORAL_TABLET | ORAL | Status: DC | PRN

## 2018-11-17 MED ORDER — SODIUM CHLORIDE 0.9 % IV SOLN
INTRAVENOUS | Status: DC | PRN
  Administered 2018-11-17: 500 mL

## 2018-11-17 MED ORDER — ROCURONIUM BROMIDE 50 MG/5ML IV SOSY
PREFILLED_SYRINGE | INTRAVENOUS | Status: DC | PRN
  Administered 2018-11-17: 20 mg via INTRAVENOUS
  Administered 2018-11-17: 30 mg via INTRAVENOUS
  Administered 2018-11-17 (×2): 10 mg via INTRAVENOUS
  Administered 2018-11-17: 50 mg via INTRAVENOUS

## 2018-11-17 MED ORDER — DEXAMETHASONE SODIUM PHOSPHATE 10 MG/ML IJ SOLN
INTRAMUSCULAR | Status: DC | PRN
  Administered 2018-11-17: 10 mg via INTRAVENOUS

## 2018-11-17 MED ORDER — ONDANSETRON HCL 4 MG PO TABS: 4.0000 mg | ORAL_TABLET | Freq: Four times a day (QID) | ORAL | Status: DC | PRN

## 2018-11-17 MED ORDER — MENTHOL 3 MG MT LOZG: 1.0000 | LOZENGE | OROMUCOSAL | Status: DC | PRN

## 2018-11-17 MED ORDER — LACTATED RINGERS IV SOLN
INTRAVENOUS | Status: DC
  Administered 2018-11-17 – 2018-11-18 (×4): via INTRAVENOUS

## 2018-11-17 MED ORDER — HYDROCODONE-ACETAMINOPHEN 5-325 MG PO TABS: 1.0000 | ORAL_TABLET | ORAL | Status: DC | PRN

## 2018-11-17 MED ORDER — SCOPOLAMINE 1 MG/3DAYS TD PT72
MEDICATED_PATCH | TRANSDERMAL | Status: AC
  Administered 2018-11-17: 1.5 mg via TRANSDERMAL
  Filled 2018-11-17: qty 1

## 2018-11-17 MED ORDER — SUCCINYLCHOLINE CHLORIDE 200 MG/10ML IV SOSY
PREFILLED_SYRINGE | INTRAVENOUS | Status: AC
  Filled 2018-11-17: qty 10

## 2018-11-17 MED ORDER — FENTANYL CITRATE (PF) 100 MCG/2ML IJ SOLN
INTRAMUSCULAR | Status: AC
  Filled 2018-11-17: qty 2

## 2018-11-17 MED ORDER — FENTANYL CITRATE (PF) 100 MCG/2ML IJ SOLN
25.0000 ug | INTRAMUSCULAR | Status: DC | PRN
  Administered 2018-11-17 (×2): 25 ug via INTRAVENOUS

## 2018-11-17 MED ORDER — THROMBIN 5000 UNITS EX SOLR
CUTANEOUS | Status: AC
  Filled 2018-11-17: qty 5000

## 2018-11-17 MED ORDER — CEFAZOLIN SODIUM-DEXTROSE 1-4 GM/50ML-% IV SOLN
1.0000 g | Freq: Three times a day (TID) | INTRAVENOUS | Status: AC
  Administered 2018-11-17 – 2018-11-18 (×2): 1 g via INTRAVENOUS
  Filled 2018-11-17 (×2): qty 50

## 2018-11-17 MED ORDER — SODIUM CHLORIDE 0.9 % IV SOLN
INTRAVENOUS | Status: DC | PRN
  Administered 2018-11-17: 50 ug/min via INTRAVENOUS

## 2018-11-17 MED ORDER — CEFAZOLIN SODIUM-DEXTROSE 2-4 GM/100ML-% IV SOLN
2.0000 g | INTRAVENOUS | Status: AC
  Administered 2018-11-17: 2 g via INTRAVENOUS
  Filled 2018-11-17: qty 100

## 2018-11-17 MED ORDER — THROMBIN 20000 UNITS EX SOLR
CUTANEOUS | Status: AC
  Filled 2018-11-17: qty 20000

## 2018-11-17 MED ORDER — ONDANSETRON HCL 4 MG/2ML IJ SOLN
INTRAMUSCULAR | Status: AC
  Filled 2018-11-17: qty 2

## 2018-11-17 MED ORDER — SODIUM CHLORIDE 0.9 % IV SOLN: 250.0000 mL | INTRAVENOUS | Status: DC

## 2018-11-17 MED ORDER — PRAMIPEXOLE DIHYDROCHLORIDE 1.5 MG PO TABS
3.0000 mg | ORAL_TABLET | Freq: Every day | ORAL | Status: DC
  Administered 2018-11-17 – 2018-11-19 (×3): 3 mg via ORAL
  Filled 2018-11-17 (×4): qty 2

## 2018-11-17 MED ORDER — MIDAZOLAM HCL 2 MG/2ML IJ SOLN
INTRAMUSCULAR | Status: AC
  Filled 2018-11-17: qty 2

## 2018-11-17 MED ORDER — PHENYLEPHRINE 40 MCG/ML (10ML) SYRINGE FOR IV PUSH (FOR BLOOD PRESSURE SUPPORT)
PREFILLED_SYRINGE | INTRAVENOUS | Status: AC
  Filled 2018-11-17: qty 10

## 2018-11-17 MED ORDER — PROPOFOL 10 MG/ML IV BOLUS
INTRAVENOUS | Status: DC | PRN
  Administered 2018-11-17: 130 mg via INTRAVENOUS

## 2018-11-17 MED ORDER — SODIUM CHLORIDE 0.9% FLUSH: 3.0000 mL | INTRAVENOUS | Status: DC | PRN

## 2018-11-17 MED ORDER — ROCURONIUM BROMIDE 50 MG/5ML IV SOSY
PREFILLED_SYRINGE | INTRAVENOUS | Status: AC
  Filled 2018-11-17: qty 5

## 2018-11-17 MED ORDER — FENTANYL CITRATE (PF) 250 MCG/5ML IJ SOLN
INTRAMUSCULAR | Status: DC | PRN
  Administered 2018-11-17: 25 ug via INTRAVENOUS
  Administered 2018-11-17: 150 ug via INTRAVENOUS
  Administered 2018-11-17: 25 ug via INTRAVENOUS
  Administered 2018-11-17: 50 ug via INTRAVENOUS

## 2018-11-17 MED ORDER — LIDOCAINE 2% (20 MG/ML) 5 ML SYRINGE
INTRAMUSCULAR | Status: DC | PRN
  Administered 2018-11-17: 60 mg via INTRAVENOUS

## 2018-11-17 MED ORDER — HYDROMORPHONE HCL 1 MG/ML IJ SOLN
1.0000 mg | INTRAMUSCULAR | Status: DC | PRN
  Administered 2018-11-17 – 2018-11-20 (×12): 1 mg via INTRAVENOUS
  Filled 2018-11-17 (×11): qty 1

## 2018-11-17 MED ORDER — MIDAZOLAM HCL 5 MG/5ML IJ SOLN
INTRAMUSCULAR | Status: DC | PRN
  Administered 2018-11-17: 2 mg via INTRAVENOUS

## 2018-11-17 MED ORDER — PROPOFOL 10 MG/ML IV BOLUS
INTRAVENOUS | Status: AC
  Filled 2018-11-17: qty 20

## 2018-11-17 MED ORDER — ONDANSETRON HCL 4 MG/2ML IJ SOLN
INTRAMUSCULAR | Status: DC | PRN
  Administered 2018-11-17: 4 mg via INTRAVENOUS

## 2018-11-17 MED ORDER — 0.9 % SODIUM CHLORIDE (POUR BTL) OPTIME
TOPICAL | Status: DC | PRN
  Administered 2018-11-17: 1000 mL

## 2018-11-17 SURGICAL SUPPLY — 61 items
BAG DECANTER FOR FLEXI CONT (MISCELLANEOUS) ×2 IMPLANT
BENZOIN TINCTURE PRP APPL 2/3 (GAUZE/BANDAGES/DRESSINGS) ×2 IMPLANT
BIT DRILL 13 (BIT) ×2 IMPLANT
BUR MATCHSTICK NEURO 3.0 LAGG (BURR) ×2 IMPLANT
CAGE PEEK 7X14X11 (Cage) ×2 IMPLANT
CANISTER SUCT 3000ML PPV (MISCELLANEOUS) ×2 IMPLANT
CARTRIDGE OIL MAESTRO DRILL (MISCELLANEOUS) ×1 IMPLANT
CLSR STERI-STRIP ANTIMIC 1/2X4 (GAUZE/BANDAGES/DRESSINGS) ×2 IMPLANT
COVER WAND RF STERILE (DRAPES) ×2 IMPLANT
DIFFUSER DRILL AIR PNEUMATIC (MISCELLANEOUS) ×2 IMPLANT
DRAPE C-ARM 42X72 X-RAY (DRAPES) ×4 IMPLANT
DRAPE LAPAROTOMY 100X72 PEDS (DRAPES) ×2 IMPLANT
DRAPE MICROSCOPE LEICA (MISCELLANEOUS) ×2 IMPLANT
DURAPREP 6ML APPLICATOR 50/CS (WOUND CARE) ×2 IMPLANT
ELECT COATED BLADE 2.86 ST (ELECTRODE) ×2 IMPLANT
ELECT REM PT RETURN 9FT ADLT (ELECTROSURGICAL) ×2
ELECTRODE REM PT RTRN 9FT ADLT (ELECTROSURGICAL) ×1 IMPLANT
GAUZE 4X4 16PLY RFD (DISPOSABLE) IMPLANT
GAUZE SPONGE 4X4 12PLY STRL (GAUZE/BANDAGES/DRESSINGS) ×2 IMPLANT
GAUZE SPONGE 4X4 12PLY STRL LF (GAUZE/BANDAGES/DRESSINGS) ×2 IMPLANT
GLOVE BIO SURGEON STRL SZ 6.5 (GLOVE) ×10 IMPLANT
GLOVE BIOGEL PI IND STRL 6.5 (GLOVE) ×3 IMPLANT
GLOVE BIOGEL PI IND STRL 7.0 (GLOVE) ×2 IMPLANT
GLOVE BIOGEL PI INDICATOR 6.5 (GLOVE) ×3
GLOVE BIOGEL PI INDICATOR 7.0 (GLOVE) ×2
GLOVE ECLIPSE 9.0 STRL (GLOVE) ×2 IMPLANT
GLOVE EXAM NITRILE XL STR (GLOVE) IMPLANT
GLOVE SURG SS PI 6.0 STRL IVOR (GLOVE) ×4 IMPLANT
GOWN STRL REUS W/ TWL LRG LVL3 (GOWN DISPOSABLE) ×3 IMPLANT
GOWN STRL REUS W/ TWL XL LVL3 (GOWN DISPOSABLE) ×1 IMPLANT
GOWN STRL REUS W/TWL 2XL LVL3 (GOWN DISPOSABLE) IMPLANT
GOWN STRL REUS W/TWL LRG LVL3 (GOWN DISPOSABLE) ×3
GOWN STRL REUS W/TWL XL LVL3 (GOWN DISPOSABLE) ×2
HALTER HD/CHIN CERV TRACTION D (MISCELLANEOUS) ×2 IMPLANT
HEMOSTAT POWDER KIT SURGIFOAM (HEMOSTASIS) ×4 IMPLANT
KIT BASIN OR (CUSTOM PROCEDURE TRAY) ×2 IMPLANT
KIT TURNOVER KIT B (KITS) ×2 IMPLANT
NEEDLE SPNL 20GX3.5 QUINCKE YW (NEEDLE) ×2 IMPLANT
NS IRRIG 1000ML POUR BTL (IV SOLUTION) ×2 IMPLANT
OIL CARTRIDGE MAESTRO DRILL (MISCELLANEOUS) ×2
PACK LAMINECTOMY NEURO (CUSTOM PROCEDURE TRAY) ×2 IMPLANT
PAD ARMBOARD 7.5X6 YLW CONV (MISCELLANEOUS) ×6 IMPLANT
PATTIES SURGICAL 1X1 (DISPOSABLE) ×2 IMPLANT
PEEK CAGE 7X14X11 (Cage) ×2 IMPLANT
PLATE 3 65XLCK NS SPNE CVD (Plate) ×1 IMPLANT
PLATE 3 ATLANTIS TRANS (Plate) ×1 IMPLANT
RUBBERBAND STERILE (MISCELLANEOUS) ×4 IMPLANT
SCREW ST FIX 4 ATL 3120213 (Screw) ×16 IMPLANT
SPACER SPNL 11X14X7XPEEK CVD (Cage) ×2 IMPLANT
SPCR SPNL 11X14X7XPEEK CVD (Cage) ×2 IMPLANT
SPONGE INTESTINAL PEANUT (DISPOSABLE) ×2 IMPLANT
SPONGE SURGIFOAM ABS GEL 100 (HEMOSTASIS) ×2 IMPLANT
STRIP CLOSURE SKIN 1/2X4 (GAUZE/BANDAGES/DRESSINGS) ×2 IMPLANT
SUT VIC AB 3-0 SH 8-18 (SUTURE) ×2 IMPLANT
SUT VIC AB 4-0 RB1 18 (SUTURE) ×4 IMPLANT
TAPE CLOTH 4X10 WHT NS (GAUZE/BANDAGES/DRESSINGS) ×2 IMPLANT
TAPE CLOTH SURG 4X10 WHT LF (GAUZE/BANDAGES/DRESSINGS) ×2 IMPLANT
TOWEL GREEN STERILE (TOWEL DISPOSABLE) ×2 IMPLANT
TOWEL GREEN STERILE FF (TOWEL DISPOSABLE) ×2 IMPLANT
TRAP SPECIMEN MUCOUS 40CC (MISCELLANEOUS) ×2 IMPLANT
WATER STERILE IRR 1000ML POUR (IV SOLUTION) ×2 IMPLANT

## 2018-11-17 NOTE — Transfer of Care (Signed)
Immediate Anesthesia Transfer of Care Note  Patient: Antonio Oliver  Procedure(s) Performed: CERVICAL THREE-CERVICAL FOUR, CERVICAL FOUR-CERVICAL FIVE, CERVICAL FIVE-CERVICAL SIX ANTERIOR CERVICAL DECOMPRESSION/DISCECTOMY FUSION (N/A Spine Cervical)  Patient Location: PACU  Anesthesia Type:General  Level of Consciousness: awake, alert  and oriented  Airway & Oxygen Therapy: Patient Spontanous Breathing and Patient connected to nasal cannula oxygen  Post-op Assessment: Report given to RN and Post -op Vital signs reviewed and stable  Post vital signs: Reviewed and stable  Last Vitals:  Vitals Value Taken Time  BP 129/76 11/17/2018  3:13 PM  Temp    Pulse 95 11/17/2018  3:16 PM  Resp 20 11/17/2018  3:16 PM  SpO2 93 % 11/17/2018  3:16 PM  Vitals shown include unvalidated device data.  Last Pain:  Vitals:   11/17/18 1514  TempSrc:   PainSc: (P) 0-No pain      Patients Stated Pain Goal: 1 (77/41/28 7867)  Complications: No apparent anesthesia complications

## 2018-11-17 NOTE — Progress Notes (Signed)
Patient had 2 episodes where he had difficulty moving air, complaining of shortness of breath with oxygen saturation dropping to the 80s. Dr. Suann Larry notified each time and at bedside to see patient. Positive pressure given via Ambu bag and each time patient responded positively. Dr. Annette Stable notified and came by to see patient. No issues noted with surgical site. Dr. Annette Stable wants patient monitored overnight in the ICU. Will continue to monitor patient status.

## 2018-11-17 NOTE — Progress Notes (Signed)
Postop note.  Patient with 2 separate episodes of upper airway obstruction postoperatively.  Symptoms broke reasonably easily with a positive pressure mask ventilation.  Currently the patient breathing well.  He has no respiratory distress.  He is swallowing well.  He does feel a little hoarse.  Upper and lower extremity symptoms are much improved from preop.  Grip and intrinsic strength much better.  Sensation much better.  His neck is soft.  There is no evidence of hematoma.  His airway is midline without any evidence of obstructing processes.  I am worried the patient had some transient laryngospasm secondary to upper airway irritation and possibly some lingering effects of general anesthesia/muscle relaxation.  He may also have a little bit of recurrent laryngeal nerve dysfunction although this is difficult to say for sure at present.  At this point I do not feel that he needs to be reintubated as his airway seems patent and fine at present.  We will watch him overnight in the ICU.  If his symptoms recur he may require ear nose and throat evaluation.

## 2018-11-17 NOTE — Anesthesia Preprocedure Evaluation (Addendum)
Anesthesia Evaluation  Patient identified by MRN, date of birth, ID band Patient awake    Reviewed: Allergy & Precautions, NPO status , Patient's Chart, lab work & pertinent test results  History of Anesthesia Complications Negative for: history of anesthetic complications  Airway Mallampati: II  TM Distance: >3 FB Neck ROM: Limited    Dental no notable dental hx. (+) Dental Advisory Given   Pulmonary neg pulmonary ROS,    Pulmonary exam normal        Cardiovascular hypertension, Normal cardiovascular exam+ pacemaker   S/P aortic root/AVR   Neuro/Psych PSYCHIATRIC DISORDERS Anxiety Depression Cervical stenosis with myelopathy  negative neurological ROS     GI/Hepatic Neg liver ROS, GERD  ,  Endo/Other  diabetesHypothyroidism   Renal/GU negative Renal ROS     Musculoskeletal negative musculoskeletal ROS (+)   Abdominal   Peds  Hematology negative hematology ROS (+)   Anesthesia Other Findings Day of surgery medications reviewed with the patient.  Reproductive/Obstetrics                            Anesthesia Physical Anesthesia Plan  ASA: III  Anesthesia Plan: General   Post-op Pain Management:    Induction: Intravenous  PONV Risk Score and Plan: 3 and Ondansetron, Dexamethasone and Scopolamine patch - Pre-op  Airway Management Planned: Oral ETT  Additional Equipment:   Intra-op Plan:   Post-operative Plan: Extubation in OR  Informed Consent: I have reviewed the patients History and Physical, chart, labs and discussed the procedure including the risks, benefits and alternatives for the proposed anesthesia with the patient or authorized representative who has indicated his/her understanding and acceptance.     Dental advisory given  Plan Discussed with: CRNA and Anesthesiologist  Anesthesia Plan Comments:        Anesthesia Quick Evaluation

## 2018-11-17 NOTE — Anesthesia Procedure Notes (Signed)
Procedure Name: Intubation Date/Time: 11/17/2018 12:38 PM Performed by: Imagene Riches, CRNA Pre-anesthesia Checklist: Patient identified, Emergency Drugs available, Suction available and Patient being monitored Patient Re-evaluated:Patient Re-evaluated prior to induction Oxygen Delivery Method: Circle system utilized Preoxygenation: Pre-oxygenation with 100% oxygen Induction Type: IV induction Ventilation: Mask ventilation without difficulty Laryngoscope Size: Glidescope and 4 Tube type: Oral Tube size: 7.5 mm Number of attempts: 1 Airway Equipment and Method: Rigid stylet and Video-laryngoscopy Placement Confirmation: ETT inserted through vocal cords under direct vision,  positive ETCO2 and breath sounds checked- equal and bilateral Secured at: 21 cm Tube secured with: Tape Dental Injury: Teeth and Oropharynx as per pre-operative assessment  Comments: Cspine remains neutral throughout DL

## 2018-11-17 NOTE — Brief Op Note (Signed)
11/17/2018  3:02 PM  PATIENT:  Antonio Oliver  63 y.o. male  PRE-OPERATIVE DIAGNOSIS:  Stenosis  POST-OPERATIVE DIAGNOSIS:  Stenosis  PROCEDURE:  Procedure(s): CERVICAL THREE-CERVICAL FOUR, CERVICAL FOUR-CERVICAL FIVE, CERVICAL FIVE-CERVICAL SIX ANTERIOR CERVICAL DECOMPRESSION/DISCECTOMY FUSION (N/A)  SURGEON:  Surgeon(s) and Role:    * Earnie Larsson, MD - Primary  PHYSICIAN ASSISTANT:   ASSISTANTSReinaldo Meeker, NP   ANESTHESIA:   general  EBL:  400 mL   BLOOD ADMINISTERED:none  DRAINS: none   LOCAL MEDICATIONS USED:  NONE  SPECIMEN:  No Specimen  DISPOSITION OF SPECIMEN:  N/A  COUNTS:  YES  TOURNIQUET:  * No tourniquets in log *  DICTATION: .Dragon Dictation  PLAN OF CARE: Admit to inpatient   PATIENT DISPOSITION:  PACU - hemodynamically stable.   Delay start of Pharmacological VTE agent (>24hrs) due to surgical blood loss or risk of bleeding: yes

## 2018-11-17 NOTE — Progress Notes (Signed)
Orthopedic Tech Progress Note Patient Details:  Antonio Oliver 05/12/1956 [id number] RN said patient has soft collar already Patient ID: [name], male   DOB: 02-Nov-1955, 63 y.o.   MRN: [id number]   [name] 11/17/2018, 6:05 PM

## 2018-11-17 NOTE — Anesthesia Postprocedure Evaluation (Signed)
Anesthesia Post Note  Patient: Antonio Oliver  Procedure(s) Performed: CERVICAL THREE-CERVICAL FOUR, CERVICAL FOUR-CERVICAL FIVE, CERVICAL FIVE-CERVICAL SIX ANTERIOR CERVICAL DECOMPRESSION/DISCECTOMY FUSION (N/A Spine Cervical)     Patient location during evaluation: PACU Anesthesia Type: General Level of consciousness: awake and alert Pain management: pain level controlled Vital Signs Assessment: post-procedure vital signs reviewed and stable Respiratory status: spontaneous breathing, nonlabored ventilation, respiratory function stable and patient connected to nasal cannula oxygen Cardiovascular status: blood pressure returned to baseline and stable Postop Assessment: no apparent nausea or vomiting Anesthetic complications: no    Last Vitals:  Vitals:   11/17/18 1630 11/17/18 1645  BP: (!) 148/78 139/79  Pulse: 96 100  Resp: (!) 21 18  Temp: 37.2 C   SpO2: 99% 99%    Last Pain:  Vitals:   11/17/18 1645  TempSrc:   PainSc: Asleep                 Becky Colan,W. EDMOND

## 2018-11-17 NOTE — Op Note (Signed)
Date of procedure: 11/17/2018  Date of dictation: Same  Service: Neurosurgery  Preoperative diagnosis: Cervical stenosis with myelopathy  Postoperative diagnosis: Same  Procedure Name: C3-4, C4-5, C5-6 anterior cervical discectomy with interbody fusion utilizing interbody peek cages, local harvested autograft, and anterior plate instrumentation  Surgeon:Ashawnti Tangen A.Janeah Kovacich, M.D.  Asst. Surgeon: Reinaldo Meeker, NP  Anesthesia: General  Indication: 63 year old male with progressive neck and bilateral upper extremity numbness paresthesias and weakness consistent with a progressive cervical myelopathy.  Work-up demonstrates evidence of prior anterior cervical fusion at C6-7.  Patient with marked stenosis and kyphosis at C5-6.  He has severe stenosis at C3-4 and C4-5.  Patient presents now for 3 level anterior cervical decompression and fusion in hopes of improving his symptoms.  Operative note: After induction of anesthesia, patient position supine with neck slightly extended and held placed halter traction.  Patient's anterior cervical region prepped and draped sterilely.  Incision made overlying C4-5.  Dissection performed on the right.  Retractor placed.  Fluoroscopy used.  Levels confirmed.  Displaces at C3-4, C4-5 and C5-6 were incised.  Discectomy was performed using various instruments down to level the posterior annulus.  Microscope was then brought to field use throughout the remainder of the discectomy.  Remaining aspects of osteophytes and annulus were removed using high-speed drill down to level the posterior logical limb.  Posterior logical was elevated and resected using Kerrison rongeurs.  Underlying thecal sac was then identified.  A wide central decompression then performed undercutting the bodies of C3 and C4.  Decompression then proceeded into each neural foramina.  Wide anterior foraminotomies were performed on the course exiting nerve roots bilaterally.  At this point a very thorough  decompression had been achieved.  There was no evidence of injury to thecal sac and nerve roots.  Procedure then repeated at C4-5 and C5-6 again without complications.  Wound was then irrigated fanlike solution.  Gelfoam was placed topically for hemostasis and then removed.  [employer] anatomic peek cages packed with locally harvested autograft were then impacted into place and recessed slightly from the anterior cortical margin.  Atlantis translational plate was then placed over the C3, C4, C5, C6 levels.  This was attached under fluoroscopic guidance and 13 mm fixed angle screws to each at all 4 levels.  All screws given final tightening found to be solid within the bone.  Locking screws engaged in all levels.  Final images reveal good position of the cages and the hardware at the proper upper levels with improved alignment of the spine.  The wound is then irrigated one final time.  Hemostasis was assured.  Wound is then closed in layers with Vicryl sutures.  Steri-Strips and sterile dressing were applied.  No apparent complications.  Patient tolerated the procedure well and he returns to the recovery room postop.

## 2018-11-17 NOTE — Progress Notes (Signed)
Patient has mild swelling of his foot.  Patient had previous tibia fracture treated with interlock has been screw proximally and the tip of the rod is at the ankle joint or may possibly be in the ankle joint.  Had a comminuted distal tibia fracture and appeared to have had some shortening.  Wife and patient had asked the he could be followed in [place].  We discussed touchdown weightbearing only for his left lower extremity.  I reviewed x-rays with the wife of his nondisplaced cuneiform fracture.  He can follow-up in the office in 1 to 2 weeks and once his neck is doing well we can discussed options of the tibia if is not healed.  Office follow-up is been scheduled at time of discharge.

## 2018-11-17 NOTE — Progress Notes (Addendum)
Rep for [employer] paged   11:47 AM Returned page from [place] rep.  Rep is on the way to see patient

## 2018-11-18 LAB — GLUCOSE, CAPILLARY
Glucose-Capillary: 119 mg/dL — ABNORMAL HIGH (ref 70–99)
Glucose-Capillary: 185 mg/dL — ABNORMAL HIGH (ref 70–99)
Glucose-Capillary: 164 mg/dL — ABNORMAL HIGH (ref 70–99)

## 2018-11-18 NOTE — Progress Notes (Signed)
P.t. has arrived on 3w. P.t. is a&O x4. Oxygen is set up at bedside. P.t. is wearing soft cervical collar and foot brace. P.t.'s wife is at bedside. P.t. has cell phone on person.

## 2018-11-18 NOTE — Progress Notes (Signed)
Patient ID: Antonio Oliver, male   DOB: 1956-08-12, 63 y.o.   MRN: [id number] Seems to be doing fairly well.  Breathing well and his incision is clean dry and intact and flat.  His left hand is doing better than preop his right hand is a little sluggish.  Grips are 4- out of 5.  Hand intrinsics 4- out of 5.  Shoulders 2 out of 5 worse on the right, biceps and triceps 4- out of 5.  This seems stable.  He feels like this is somewhat better.  He is touch down weightbearing on the left from a foot fracture.  Mobilize as tolerated.  Transfer to the floor.  May need skilled nursing versus home with home health versus CIR.

## 2018-11-18 NOTE — Evaluation (Signed)
Physical Therapy Evaluation Patient Details Name: Antonio Oliver MRN: [id number] DOB: 10-04-1955 Today's Date: 11/18/2018   History of Present Illness  63 yo admitted with progressive bil UE weakness, gait instability and falls s/p C3-6 fusion. Pt with 2 episoses of postoperative upper airway obstruction. Pt also with recent fall and left midfoot fx currently TDWB. PMhx: C6-7ACDF, BPH, pacemaker, depression, DM, HTn, glaucoma  Clinical Impression  Pt in bed upon arrival with spouse present. Pt presents with decreased strength, pain and precautions limiting his functional mobility and safe return home. Pt educated on weight bearing status and cervical precautions. Pt would benefit from CIR level therapy post discharge to strengthen UE and progress functional mobility in preparation for future LLE surgery and to decrease caregiver burden on wife. Pt and wife educated on plan and discharge recommendation.     Follow Up Recommendations CIR;Supervision/Assistance - 24 hour    Equipment Recommendations  None recommended by PT    Recommendations for Other Services OT consult    Precautions / Restrictions Precautions Precautions: Cervical;Fall Required Braces or Orthoses: Cervical Brace;Other Brace Cervical Brace: Soft collar;At all times Other Brace: post op shoe on LLE per wife instruction from DR.Yates Restrictions Weight Bearing Restrictions: Yes LLE Weight Bearing: Touchdown weight bearing      Mobility  Bed Mobility Overal bed mobility: Needs Assistance Bed Mobility: Rolling;Sidelying to Sit Rolling: Min assist Sidelying to sit: Min assist       General bed mobility comments: cues for sequence with assist to elevate trunk with bed flat, no rail   Transfers Overall transfer level: Needs assistance   Transfers: Lateral/Scoot Transfers          Lateral/Scoot Transfers: Min assist;Mod assist General transfer comment: min assist with guarding LLE to scoot to right with bil  UE use x 2 trials, mod assist for transfer back to left with transition to higher surface  Ambulation/Gait             General Gait Details: unable due to precautions  Stairs            Wheelchair Mobility    Modified Rankin (Stroke Patients Only)       Balance Overall balance assessment: Mild deficits observed, not formally tested                                           Pertinent Vitals/Pain Pain Assessment: 0-10 Pain Score: 8  Pain Location: neck, 5/10 in left foot Pain Descriptors / Indicators: Aching Pain Intervention(s): Limited activity within patient's tolerance;Premedicated before session;Monitored during session;Repositioned    Home Living Family/patient expects to be discharged to:: Private residence Living Arrangements: Spouse/significant other Available Help at Discharge: Family;Available 24 hours/day Type of Home: House Home Access: Ramped entrance;Stairs to enter Entrance Stairs-Rails: Left Entrance Stairs-Number of Steps: 5 Home Layout: One level Home Equipment: Bedside commode;Grab bars - tub/shower;Grab bars - toilet;Walker - 2 wheels;Cane - single point;Wheelchair - [employer] - 4 wheels      Prior Function Level of Independence: Needs assistance   Gait / Transfers Assistance Needed: pt with fall with tibia fx in Jan was walking but pain increased and recently sitting greatly and limited walking with fall immediately PTA with foot fx  ADL's / Homemaking Assistance Needed: progressive decline in function with assist for bathing, dressing for the last 2 weeks, has been able to self feed  Hand Dominance        Extremity/Trunk Assessment   Upper Extremity Assessment Upper Extremity Assessment: Defer to OT evaluation    Lower Extremity Assessment Lower Extremity Assessment: LLE deficits/detail LLE Deficits / Details: pt with tibia and midfoot fx. WFL for quad strength, post op shoe    Cervical / Trunk  Assessment Cervical / Trunk Assessment: Other exceptions Cervical / Trunk Exceptions: post surgical with collar  Communication   Communication: No difficulties  Cognition Arousal/Alertness: Awake/alert Behavior During Therapy: WFL for tasks assessed/performed Overall Cognitive Status: Within Functional Limits for tasks assessed                                        General Comments      Exercises Total Joint Exercises Long Arc Quad: AROM;Strengthening;Both;10 reps;Seated   Assessment/Plan    PT Assessment Patient needs continued PT services(Simultaneous filing. User may not have seen previous data.)  PT Problem List Decreased strength;Decreased knowledge of precautions;Pain;Decreased mobility;Decreased range of motion;Decreased knowledge of use of DME;Decreased activity tolerance;Decreased coordination       PT Treatment Interventions DME instruction;Functional mobility training;Balance training;Therapeutic activities;Neuromuscular re-education;Wheelchair mobility training;Therapeutic exercise;Patient/family education    PT Goals (Current goals can be found in the Care Plan section)  Acute Rehab PT Goals Patient Stated Goal: return home with wife PT Goal Formulation: With patient/family Time For Goal Achievement: 12/02/18 Potential to Achieve Goals: Fair Additional Goals Additional Goal #1: pt will be able to independently verbalize management of WC parts and positioning and propel wc 20' with bil UE and rLE    Frequency Min 4X/week   Barriers to discharge        Co-evaluation               AM-PAC PT "6 Clicks" Mobility  Outcome Measure Help needed turning from your back to your side while in a flat bed without using bedrails?: A Little(Simultaneous filing. User may not have seen previous data.) Help needed moving from lying on your back to sitting on the side of a flat bed without using bedrails?: A Little(Simultaneous filing. User may not have  seen previous data.) Help needed moving to and from a bed to a chair (including a wheelchair)?: A Lot(Simultaneous filing. User may not have seen previous data.) Help needed standing up from a chair using your arms (e.g., wheelchair or bedside chair)?: A Lot(Simultaneous filing. User may not have seen previous data.) Help needed to walk in hospital room?: Total(Simultaneous filing. User may not have seen previous data.) Help needed climbing 3-5 steps with a railing? : Total(Simultaneous filing. User may not have seen previous data.) 6 Click Score: 19([id number] filing. User may not have seen previous data.)    End of Session Equipment Utilized During Treatment: Other (comment)(post op shoe Simultaneous filing. User may not have seen previous data.) Activity Tolerance: Patient tolerated treatment well(Simultaneous filing. User may not have seen previous data.) Patient left: in chair;with chair alarm set;with family/visitor present;with call bell/phone within reach(Simultaneous filing. User may not have seen previous data.) Nurse Communication: Mobility status(Simultaneous filing. User may not have seen previous data.) PT Visit Diagnosis: Other abnormalities of gait and mobility (R26.89);Muscle weakness (generalized) (M62.81);Difficulty in walking, not elsewhere classified (R26.2);Other symptoms and signs involving the nervous system (R29.898)(Simultaneous filing. User may not have seen previous data.)    Time: 2992-4268 PT Time Calculation (min) (ACUTE ONLY): 35 min  Charges:   PT Evaluation $PT Eval Moderate Complexity: 1 Mod PT Treatments $Therapeutic Activity: 8-22 mins        Zachary George, [place] [phone]   Tayra Dawe 11/18/2018, 9:54 AM

## 2018-11-18 NOTE — Progress Notes (Signed)
Rehab Admissions Coordinator Note:  Per PT, this patient was screened by Jhonnie Garner for appropriateness for an Inpatient Acute Rehab Consult.  At this time, we are recommending Inpatient Rehab consult. AC will contact MD to request an IP Rehab Consult Order.   Jhonnie Garner 11/18/2018, 10:38 AM  I can be reached at [phone].

## 2018-11-19 LAB — GLUCOSE, CAPILLARY
GLUCOSE-CAPILLARY: 158 mg/dL — AB (ref 70–99)
Glucose-Capillary: 100 mg/dL — ABNORMAL HIGH (ref 70–99)
Glucose-Capillary: 161 mg/dL — ABNORMAL HIGH (ref 70–99)
Glucose-Capillary: 106 mg/dL — ABNORMAL HIGH (ref 70–99)

## 2018-11-19 NOTE — Progress Notes (Signed)
Patient ID: Antonio Oliver, male   DOB: 1956-03-03, 63 y.o.   MRN: [id number] Overall doing fairly well.  Has appropriate neck and shoulder soreness.  Neurological exam unchanged.  He has anxiety about going to CIR but I think that is in his best interest.  His wife believe so also.  He is eager to go home.  Vision clean dry and intact.  Grips still somewhat weak but equal.  Seems to be using his hands well to feed himself.  Complains of pain in the left foot and ankle.  Continue current management hopefully he will be accepted to rehab and will choose to go

## 2018-11-19 NOTE — Evaluation (Signed)
Occupational Therapy Evaluation Patient Details Name: Antonio Oliver MRN: [id number] DOB: 27-Sep-1955 Today's Date: 11/19/2018    History of Present Illness 63 yo admitted with progressive bil UE weakness, gait instability and falls s/p C3-6 fusion. Pt with 2 episoses of postoperative upper airway obstruction. Pt also with recent fall and left midfoot fx currently TDWB. PMhx: C6-7ACDF, BPH, pacemaker, depression, DM, HTn, glaucoma   Clinical Impression   PTA, pt was living with his wife and has required increasing assistance for BADLs for past two weeks. Pt currently requiring Min A for UB ADLs, Max A for LB ADLs, and Min-Mod A for functional transfers. Pt presenting with decreased balance, strength, functional use of RUE (dominant hand), and activity tolerance due to pain. Pt highly motivated to participate in therapy despite pain. Pt with good family support and wife wanting to assist as she can and be educated on ways to increase his progress. Pt will require further acute OT to facilitate safe dc. Discussed dc option and OT's recommendation; pt verbalizing he wants to go home but will think about CIR. Recommend dc to CIR for further OT to optimize safety, independence with ADLs, and return to PLOF as well as decrease burden of care.      Follow Up Recommendations  CIR;Supervision/Assistance - 24 hour(Pt wanting home with [facility])    Equipment Recommendations  Other (comment)(Discussed with wife need for drop arm 3N1)    Recommendations for Other Services PT consult;Rehab consult     Precautions / Restrictions Precautions Precautions: Cervical;Fall Precaution Comments: Reviewed cervical precautions Required Braces or Orthoses: Cervical Brace;Other Brace Cervical Brace: Soft collar;At all times Other Brace: post op shoe on LLE per wife instruction from DR.Yates Restrictions Weight Bearing Restrictions: Yes LLE Weight Bearing: Touchdown weight bearing(Per Dr. Lorin Mercy note)      Mobility  Bed Mobility Overal bed mobility: Needs Assistance Bed Mobility: Rolling;Sidelying to Sit Rolling: Min assist Sidelying to sit: Min guard       General bed mobility comments: Min A for assistance with rolling due to pain. Pt able to push into sitting  Transfers Overall transfer level: Needs assistance Equipment used: Rolling walker (2 wheeled) Transfers: Sit to/from Stand;Lateral/Scoot Transfers Sit to Stand: Mod assist;From elevated surface        Lateral/Scoot Transfers: Min assist General transfer comment: Mod A for power up into standing and pt able to maintain standing with MIn A. Unable to shift right foot. Min A for lateral scoot to right to drop arm recliner    Balance Overall balance assessment: Mild deficits observed, not formally tested                                         ADL either performed or assessed with clinical judgement   ADL Overall ADL's : Needs assistance/impaired Eating/Feeding: Minimal assistance;Sitting Eating/Feeding Details (indicate cue type and reason): Limited ROM at shoulder and required Min A for bilateral tasks such as opening containers. Providing built up handles to increased independence. Optimizing positioning for self feeding and pt performing self feeding.  Grooming: Minimal assistance;Sitting   Upper Body Bathing: Moderate assistance;Sitting   Lower Body Bathing: Maximal assistance;Sit to/from stand;Sitting/lateral leans   Upper Body Dressing : Minimal assistance;Sitting   Lower Body Dressing: Maximal assistance;Sit to/from stand;Sitting/lateral leans   Toilet Transfer: Minimal assistance;Transfer board(Lateral scoot to drop arm recliner) Toilet Transfer Details (indicate cue type and reason): Pt  performing lateral scoot to drop arm recliner.          Functional mobility during ADLs: Minimal assistance;Moderate assistance;Rolling walker(Lateral scoot vs sit<>Stand with RW) General ADL Comments: Pt with  decreased balance, strength, function use of RUE, and activity tolerance due to pain.     Vision         Perception     Praxis      Pertinent Vitals/Pain Pain Assessment: 0-10 Pain Score: 7  Pain Location: Neck and left foot Pain Descriptors / Indicators: Discomfort;Grimacing Pain Intervention(s): Monitored during session;Limited activity within patient's tolerance;Repositioned     Hand Dominance Right   Extremity/Trunk Assessment Upper Extremity Assessment Upper Extremity Assessment: RUE deficits/detail RUE Deficits / Details: Decreased ROM at shoulder and decreased strength. AROM 0-20 for forward flexion of shoulder. PROM limited due to pain. decreased grasp strength and FM skills.  RUE: Unable to fully assess due to pain RUE Coordination: decreased fine motor;decreased gross motor   Lower Extremity Assessment Lower Extremity Assessment: Defer to PT evaluation LLE Deficits / Details: pt with tibia and midfoot fx. WFL for quad strength, post op shoe   Cervical / Trunk Assessment Cervical / Trunk Assessment: Other exceptions Cervical / Trunk Exceptions: post surgical with collar   Communication Communication Communication: No difficulties   Cognition Arousal/Alertness: Awake/alert Behavior During Therapy: WFL for tasks assessed/performed Overall Cognitive Status: Within Functional Limits for tasks assessed                                 General Comments: WFL for following commands and performing transfers. However, pt seems to be sad and less engaged. Pt's wife reporting that pt is more depressed than normal and does have a history of depression. RN notified and chaplian consult placed   General Comments  Wife present throughout    Exercises Exercises: Other exercises Other Exercises Other Exercises: Educating pt and wife on hand, wrist, forearm, and elbow exercises. Pt demonstrating understanding   Shoulder Instructions      Home Living  Family/patient expects to be discharged to:: Private residence Living Arrangements: Spouse/significant other Available Help at Discharge: Family;Available 24 hours/day Type of Home: House Home Access: Ramped entrance;Stairs to enter Entrance Stairs-Number of Steps: 5 Entrance Stairs-Rails: Left Home Layout: One level     Bathroom Shower/Tub: Teacher, early years/pre: Standard(BSC over)     Home Equipment: Bedside commode;Grab bars - tub/shower;Grab bars - toilet;Walker - 2 wheels;Cane - single point;Wheelchair - [employer] - 4 wheels          Prior Functioning/Environment Level of Independence: Needs assistance  Gait / Transfers Assistance Needed: pt with fall with tibia fx in Jan was walking but pain increased and recently sitting greatly and limited walking with fall immediately PTA with foot fx ADL's / Homemaking Assistance Needed: progressive decline in function with assist for bathing, dressing for the last 2 weeks, has been able to self feed            OT Problem List: Decreased strength;Decreased range of motion;Impaired balance (sitting and/or standing);Decreased activity tolerance;Decreased safety awareness;Decreased knowledge of use of DME or AE;Decreased knowledge of precautions;Pain;Impaired UE functional use      OT Treatment/Interventions: Self-care/ADL training;Therapeutic exercise;Energy conservation;DME and/or AE instruction;Therapeutic activities;Patient/family education    OT Goals(Current goals can be found in the care plan section) Acute Rehab OT Goals Patient Stated Goal: return home with wife OT Goal Formulation: With patient  Time For Goal Achievement: 12/03/18 Potential to Achieve Goals: Good  OT Frequency: Min 3X/week   Barriers to D/C:            Co-evaluation              AM-PAC OT "6 Clicks" Daily Activity     Outcome Measure Help from another person eating meals?: A Little Help from another person taking care of  personal grooming?: A Little Help from another person toileting, which includes using toliet, bedpan, or urinal?: A Lot Help from another person bathing (including washing, rinsing, drying)?: A Lot Help from another person to put on and taking off regular upper body clothing?: A Lot Help from another person to put on and taking off regular lower body clothing?: A Lot 6 Click Score: 14   End of Session Equipment Utilized During Treatment: Gait belt;Cervical collar Nurse Communication: Mobility status;Other (comment)(Chair alarm out of batteries)  Activity Tolerance: Patient tolerated treatment well;Patient limited by pain Patient left: in chair;with call bell/phone within reach;Other (comment);with family/visitor present(Charm alarm out of batteries; RN notified)  OT Visit Diagnosis: Unsteadiness on feet (R26.81);Other abnormalities of gait and mobility (R26.89);Muscle weakness (generalized) (M62.81);Pain;History of falling (Z91.81) Pain - Right/Left: Left(Neck) Pain - part of body: Leg(Neck)                Time: 0721-0800 OT Time Calculation (min): 39 min Charges:  OT General Charges $OT Visit: 1 Visit OT Evaluation $OT Eval Moderate Complexity: 1 Mod OT Treatments $Self Care/Home Management : 23-37 mins  [place], OTR/L Acute Rehab Pager: [phone] Office: [place] 11/19/2018, 8:53 AM

## 2018-11-19 NOTE — Plan of Care (Signed)
  Problem: Education: Goal: Knowledge of General Education information will improve Description: Including pain rating scale, medication(s)/side effects and non-pharmacologic comfort measures Outcome: Progressing   Problem: Clinical Measurements: Goal: Ability to maintain clinical measurements within normal limits will improve Outcome: Progressing   

## 2018-11-20 DIAGNOSIS — Z419 Encounter for procedure for purposes other than remedying health state, unspecified: Secondary | ICD-10-CM

## 2018-11-20 DIAGNOSIS — F329 Major depressive disorder, single episode, unspecified: Secondary | ICD-10-CM

## 2018-11-20 DIAGNOSIS — R531 Weakness: Secondary | ICD-10-CM

## 2018-11-20 DIAGNOSIS — I10 Essential (primary) hypertension: Secondary | ICD-10-CM

## 2018-11-20 DIAGNOSIS — D72829 Elevated white blood cell count, unspecified: Secondary | ICD-10-CM

## 2018-11-20 DIAGNOSIS — E119 Type 2 diabetes mellitus without complications: Secondary | ICD-10-CM

## 2018-11-20 LAB — GLUCOSE, CAPILLARY
Glucose-Capillary: 126 mg/dL — ABNORMAL HIGH (ref 70–99)
Glucose-Capillary: 134 mg/dL — ABNORMAL HIGH (ref 70–99)

## 2018-11-20 MED ORDER — TIZANIDINE HCL 4 MG PO CAPS: 4.0000 mg | ORAL_CAPSULE | Freq: Three times a day (TID) | ORAL | 1 refills | Status: DC | PRN

## 2018-11-20 MED ORDER — OXYCODONE-ACETAMINOPHEN 10-325 MG PO TABS: 1.0000 | ORAL_TABLET | ORAL | 0 refills | Status: DC | PRN

## 2018-11-20 NOTE — Discharge Summary (Signed)
Physician Discharge Summary  Patient ID: KAION TISDALE MRN: [id number] DOB/AGE: 1955/10/09 63 y.o.  Admit date: 11/14/2018 Discharge date: 11/20/2018  Admission Diagnoses: Cervical spine gnosis with myelopathy    Discharge Diagnoses: Same, left foot fracture   Discharged Condition: stable  Hospital Course: The patient was admitted on 11/14/2018 and taken to the operating room where the patient underwent ACDF. The patient tolerated the procedure well and was taken to the recovery room and then to the ICU in stable condition. The hospital course was routine. There were no complications.  Was transferred to the floor on postop day 1.  The wound remained clean dry and intact. Pt had appropriate neck soreness. No complaints of arm pain or new N/T/W. The patient remained afebrile with stable vital signs, and tolerated a regular diet. The patient continued to increase activities, and pain was well controlled with oral pain medications.  Because of his touchdown weightbearing status on the left leg because of his pre-existing foot fracture CIR was consulted but he refused this and wanted to go home.  Consults: rehabilitation medicine and orthopedic surgery  Significant Diagnostic Studies:  Results for orders placed or performed during the hospital encounter of 11/14/18  MRSA PCR Screening  Result Value Ref Range   MRSA by PCR NEGATIVE NEGATIVE  CBC with Differential  Result Value Ref Range   WBC 11.2 (H) 4.0 - 10.5 K/uL   RBC 4.44 4.22 - 5.81 MIL/uL   Hemoglobin 10.8 (L) 13.0 - 17.0 g/dL   HCT 36.3 (L) 39.0 - 52.0 %   MCV 81.8 80.0 - 100.0 fL   MCH 24.3 (L) 26.0 - 34.0 pg   MCHC 29.8 (L) 30.0 - 36.0 g/dL   RDW 14.2 11.5 - 15.5 %   Platelets 253 150 - 400 K/uL   nRBC 0.0 0.0 - 0.2 %   Neutrophils Relative % 67 %   Neutro Abs 7.6 1.7 - 7.7 K/uL   Lymphocytes Relative 19 %   Lymphs Abs 2.1 0.7 - 4.0 K/uL   Monocytes Relative 8 %   Monocytes Absolute 0.9 0.1 - 1.0 K/uL   Eosinophils  Relative 5 %   Eosinophils Absolute 0.6 (H) 0.0 - 0.5 K/uL   Basophils Relative 1 %   Basophils Absolute 0.1 0.0 - 0.1 K/uL   Immature Granulocytes 0 %   Abs Immature Granulocytes 0.04 0.00 - 0.07 K/uL  Comprehensive metabolic panel  Result Value Ref Range   Sodium 136 135 - 145 mmol/L   Potassium 4.4 3.5 - 5.1 mmol/L   Chloride 106 98 - 111 mmol/L   CO2 23 22 - 32 mmol/L   Glucose, Bld 138 (H) 70 - 99 mg/dL   BUN 8 8 - 23 mg/dL   Creatinine, Ser 0.80 0.61 - 1.24 mg/dL   Calcium 9.3 8.9 - 10.3 mg/dL   Total Protein 6.1 (L) 6.5 - 8.1 g/dL   Albumin 3.9 3.5 - 5.0 g/dL   AST 35 15 - 41 U/L   ALT 39 0 - 44 U/L   Alkaline Phosphatase 131 (H) 38 - 126 U/L   Total Bilirubin 0.7 0.3 - 1.2 mg/dL   GFR calc non Af Amer >60 >60 mL/min   GFR calc Af Amer >60 >60 mL/min   Anion gap 7 5 - 15  Urinalysis, Routine w reflex microscopic  Result Value Ref Range   Color, Urine YELLOW YELLOW   APPearance CLEAR CLEAR   Specific Gravity, Urine 1.012 1.005 - 1.030   pH  5.0 5.0 - 8.0   Glucose, UA NEGATIVE NEGATIVE mg/dL   Hgb urine dipstick LARGE (A) NEGATIVE   Bilirubin Urine NEGATIVE NEGATIVE   Ketones, ur NEGATIVE NEGATIVE mg/dL   Protein, ur NEGATIVE NEGATIVE mg/dL   Nitrite NEGATIVE NEGATIVE   Leukocytes,Ua NEGATIVE NEGATIVE   RBC / HPF >50 (H) 0 - 5 RBC/hpf   WBC, UA 0-5 0 - 5 WBC/hpf   Bacteria, UA RARE (A) NONE SEEN   Mucus PRESENT    Sperm, UA PRESENT   HIV antibody (Routine Testing)  Result Value Ref Range   HIV Screen 4th Generation wRfx Non Reactive Non Reactive  Glucose, capillary  Result Value Ref Range   Glucose-Capillary 130 (H) 70 - 99 mg/dL  Glucose, capillary  Result Value Ref Range   Glucose-Capillary 157 (H) 70 - 99 mg/dL   Comment 1 Notify RN    Comment 2 Document in Chart   Glucose, capillary  Result Value Ref Range   Glucose-Capillary 218 (H) 70 - 99 mg/dL  Glucose, capillary  Result Value Ref Range   Glucose-Capillary 182 (H) 70 - 99 mg/dL   Comment 1  Notify RN    Comment 2 Document in Chart   Glucose, capillary  Result Value Ref Range   Glucose-Capillary 152 (H) 70 - 99 mg/dL   Comment 1 Notify RN    Comment 2 Document in Chart   Glucose, capillary  Result Value Ref Range   Glucose-Capillary 141 (H) 70 - 99 mg/dL   Comment 1 Notify RN    Comment 2 Document in Chart   Glucose, capillary  Result Value Ref Range   Glucose-Capillary 147 (H) 70 - 99 mg/dL   Comment 1 Notify RN    Comment 2 Document in Chart   Glucose, capillary  Result Value Ref Range   Glucose-Capillary 148 (H) 70 - 99 mg/dL   Comment 1 Notify RN    Comment 2 Document in Chart   Glucose, capillary  Result Value Ref Range   Glucose-Capillary 173 (H) 70 - 99 mg/dL  Glucose, capillary  Result Value Ref Range   Glucose-Capillary 217 (H) 70 - 99 mg/dL  Glucose, capillary  Result Value Ref Range   Glucose-Capillary 309 (H) 70 - 99 mg/dL  Glucose, capillary  Result Value Ref Range   Glucose-Capillary 119 (H) 70 - 99 mg/dL   Comment 1 Notify RN    Comment 2 Document in Chart   Glucose, capillary  Result Value Ref Range   Glucose-Capillary 185 (H) 70 - 99 mg/dL   Comment 1 Notify RN    Comment 2 Document in Chart   Glucose, capillary  Result Value Ref Range   Glucose-Capillary 164 (H) 70 - 99 mg/dL  Glucose, capillary  Result Value Ref Range   Glucose-Capillary 100 (H) 70 - 99 mg/dL   Comment 1 Notify RN    Comment 2 Document in Chart   Glucose, capillary  Result Value Ref Range   Glucose-Capillary 158 (H) 70 - 99 mg/dL   Comment 1 Notify RN    Comment 2 Document in Chart   Glucose, capillary  Result Value Ref Range   Glucose-Capillary 161 (H) 70 - 99 mg/dL   Comment 1 Notify RN    Comment 2 Document in Chart   Glucose, capillary  Result Value Ref Range   Glucose-Capillary 106 (H) 70 - 99 mg/dL  Glucose, capillary  Result Value Ref Range   Glucose-Capillary 126 (H) 70 - 99 mg/dL  Comment 1 Notify RN    Comment 2 Document in Chart   I-Stat  Troponin, ED (not at [facility])  Result Value Ref Range   Troponin i, poc 0.00 0.00 - 0.08 ng/mL   Comment 3          Type and screen [place]  Result Value Ref Range   ABO/RH(D) O POS    Antibody Screen NEG    Sample Expiration      11/20/2018 Performed at [facility], [place] [address] [place], [place] [address]     Dg Cervical Spine 1 View  Result Date: 11/17/2018 CLINICAL DATA:  Anterior cervical fusion. EXAM: DG CERVICAL SPINE - 1 VIEW; DG C-ARM 61-120 MIN Radiation exposure index: 2.4 mGy. COMPARISON:  None. FINDINGS: Three intraoperative fluoroscopic images of the cervical spine were obtained. The first image demonstrates surgical probe directed at C3-4 space. Last 2 images demonstrate the patient be status post surgical anterior fusion of C3-4, C4-5 and C5-6. IMPRESSION: Status post surgical anterior fusion of C3-4, C4-5 and C5-6. Good alignment of vertebral bodies is noted. Electronically Signed   By: Marijo Conception, M.D.   On: 11/17/2018 15:21   Ct Head Wo Contrast  Result Date: 11/14/2018 CLINICAL DATA:  Weakness and loss of balance. EXAM: CT HEAD WITHOUT CONTRAST TECHNIQUE: Contiguous axial images were obtained from the base of the skull through the vertex without intravenous contrast. COMPARISON:  None. FINDINGS: Brain: There is no mass, hemorrhage or extra-axial collection. The size and configuration of the ventricles and extra-axial CSF spaces are normal. The brain parenchyma is normal, without acute or chronic infarction. Vascular: No abnormal hyperdensity of the major intracranial arteries or dural venous sinuses. No intracranial atherosclerosis. Skull: The visualized skull base, calvarium and extracranial soft tissues are normal. Sinuses/Orbits: No fluid levels or advanced mucosal thickening of the visualized paranasal sinuses. No mastoid or middle ear effusion. The orbits are normal. IMPRESSION: Normal brain. Electronically Signed   By: Ulyses Jarred M.D.    On: 11/14/2018 20:12   Ct Cervical Spine Wo Contrast  Result Date: 11/06/2018 CLINICAL DATA:  Neck pain and right greater than left arm numbness for the past 6 weeks. EXAM: CT CERVICAL SPINE WITHOUT CONTRAST TECHNIQUE: Multidetector CT imaging of the cervical spine was performed without intravenous contrast. Multiplanar CT image reconstructions were also generated. COMPARISON:  Cervical spine x-rays dated March 22, 2011. FINDINGS: Alignment: Mild levoscoliosis. New facet mediated 3 mm anterolisthesis at C2-C3, C4-C5, and C5-C6. Unchanged 2 mm anterolisthesis at C7-T1. Skull base and vertebrae: Prior C6-C7 interbody fusion. No acute fracture. No primary bone lesion or focal pathologic process. Soft tissues and spinal canal: No prevertebral fluid or swelling. No visible canal hematoma. Disc levels: C2-C3: Disc bulging with moderate right uncovertebral hypertrophy and bilateral facet arthropathy. Mild right neuroforaminal stenosis. C3-C4: Moderate right uncovertebral hypertrophy. Severe bilateral facet arthropathy. Moderate bilateral neuroforaminal stenosis. C4-C5: Mild disc bulging with moderate right uncovertebral hypertrophy and severe bilateral facet arthropathy. Moderate bilateral neuroforaminal stenosis. C5-C6: Disc uncovering without significant disc bulging. Mild bilateral uncovertebral hypertrophy. Severe right and moderate left facet arthropathy. Mild bilateral neuroforaminal stenosis. C6-C7:  Prior interbody fusion.  No stenosis. C7-T1:  Severe bilateral facet arthropathy.  No stenosis. Upper chest: Negative. Other: None. IMPRESSION: 1. Progressive multilevel severe facet arthropathy and mild spondylolisthesis. Moderate bilateral neuroforaminal stenosis at C3-C4 and C4-C5. 2. Prior interbody fusion at C6-C7 without stenosis. Electronically Signed   By: Titus Dubin M.D.   On: 11/06/2018  12:48   Ct Cervical Spine W Contrast  Result Date: 11/15/2018 CLINICAL DATA:  Cervical myelopathy. Multiple falls.  Rapidly losing strength in arms and legs. FLUOROSCOPY TIME:  1.3 minutes corresponding to a Dose Area Product of 1533 ?Gy*m2 PROCEDURE: LUMBAR PUNCTURE FOR CERVICAL MYELOGRAM After thorough discussion of risks and benefits of the procedure including bleeding, infection, injury to nerves, blood vessels, adjacent structures as well as headache and CSF leak, written and oral informed consent was obtained. Consent was obtained by Dr. Rolla Flatten. We discussed the high likelihood of obtaining a diagnostic study. Patient was positioned prone on the fluoroscopy table. Local anesthesia was provided with 1% lidocaine without epinephrine after prepped and draped in the usual sterile fashion. Puncture was performed at L2-L3 using a 3 1/2 inch 22-gauge spinal needle via midline approach. Using a single pass through the dura, the needle was placed within the thecal sac, with return of clear CSF. 10 mL of Isovue M-300 was injected into the thecal sac, with normal opacification of the nerve roots and cauda equina consistent with free flow within the subarachnoid space. The patient was then moved to the trendelenburg position and contrast flowed into the Cervical spine region. I personally performed the lumbar puncture and administered the intrathecal contrast. I also personally supervised acquisition of the myelogram images. TECHNIQUE: Contiguous axial images were obtained through the Cervical spine after the intrathecal infusion of infusion. Coronal and sagittal reconstructions were obtained of the axial image sets. FINDINGS: CERVICAL MYELOGRAM FINDINGS: There is good opacification of the cervical subarachnoid space. There is a near complete block at C4-C5. insufficient contrast passes cephalad to this to assess for nerve root cut off. CT CERVICAL MYELOGRAM FINDINGS: Alignment: Degenerative rotoscoliosis convex LEFT mid cervical region. Degenerative anterolisthesis of 2 mm at C2-3, 2 mm at C4-5, 3 mm at C5-6, and 2 mm at C7-T1.  Vertebrae: Solid arthrodesis C6-C7. No worrisome osseous lesion. Cord: Severe cord compression at C3-4 and C4-5, canal diameter 5 mm. Mild dorsal displacement of the cord at C2-3, not significantly compressive. Posterior Fossa: No tonsillar herniation. Paraspinal tissues: Unremarkable. Disc levels: C2-3: 2 mm degenerative anterolisthesis. Central protrusion. Effacement anterior subarachnoid space and minimal flattening of the ventral aspect of the cord without significant stenosis. RIGHT greater than LEFT C3 foraminal narrowing due to facet arthropathy and osseous spurring. C3-4: Central protrusion with osseous spurring. Facet arthropathy. Ligamentum flavum infolding. Severe stenosis with cord compression. Canal diameter 5 mm. BILATERAL C4 foraminal narrowing. C4-5: 2 mm anterolisthesis. Central protrusion with osseous spurring. Facet arthropathy. Ligamentum flavum infolding. Severe stenosis with cord compression. Canal diameter 5 mm. C5-6: 2 mm anterolisthesis. Annular bulge with osseous spurring. Facet arthropathy. LEFT C6 foraminal narrowing. No cord compression or significant stenosis. C6-7:  Solid arthrodesis. No impingement. C7-T1: 2 mm anterolisthesis. Facet arthropathy. Ligamentum flavum infolding. No significant cord compression. Borderline RIGHT greater than LEFT C8 foraminal narrowing. IMPRESSION: Near-total block at C4-5 on myelography. Degenerative rotoscoliosis convex LEFT mid cervical region Severe spinal stenosis with cord compression at C3-4 and C4-5, related to ventral disc material and osseous spurring, as well as posterior element hypertrophy with ligamentum flavum infolding. Canal diameter 5 mm at both C4 and C5. Solid arthrodesis C6-C7. Electronically Signed   By: Staci Righter M.D.   On: 11/15/2018 13:45   Dg Tibia/fibula Left Port  Result Date: 11/15/2018 CLINICAL DATA:  Acute onset of pain at the left lower leg, near the ankle and knee. Personal history of left tibial fracture. EXAM:  PORTABLE LEFT TIBIA  AND FIBULA - 2 VIEW COMPARISON:  Left ankle radiographs performed 09/20/2018 FINDINGS: There has been some degree of interval healing since January, at the patient's mildly displaced comminuted fracture of the distal tibia, though a mildly displaced lateral butterfly fragment is still seen. There has also been some degree of interval healing at the proximal fibular fracture, though the fracture line is still partially evident. No new fractures are identified. The tibial hardware appears grossly intact. The ankle mortise is grossly unremarkable. The known medial cuneiform fracture is not imaged on this study. No knee joint effusion is identified. IMPRESSION: 1. Some degree of interval healing since January, at the patient's mildly displaced comminuted fracture of the distal tibia. Some degree of interval healing at the proximal fibular fracture, though the fracture line is still partially evident. 2. Hardware intact. Electronically Signed   By: Garald Balding M.D.   On: 11/15/2018 15:35   Dg Foot Complete Left  Result Date: 11/14/2018 CLINICAL DATA:  Fall today. EXAM: LEFT FOOT - COMPLETE 3+ VIEW COMPARISON:  Left foot radiographs 09/20/2018. Left lower extremity CT 09/20/2018. FINDINGS: The bones are diffusely osteopenic. There is a nondisplaced fracture of the medial cuneiform, not evident on the prior radiographs. Moderate soft tissue swelling is noted throughout the foot. There is mild spurring in the dorsal midfoot and talus, and a diminutive plantar calcaneal enthesophyte is noted. A comminuted fracture of the distal tibia is again partially visualized status post intramedullary nail and screw fixation. IMPRESSION: Nondisplaced medial cuneiform fracture. Electronically Signed   By: Logan Bores M.D.   On: 11/14/2018 20:39   Dg C-arm 1-60 Min  Result Date: 11/17/2018 CLINICAL DATA:  Anterior cervical fusion. EXAM: DG CERVICAL SPINE - 1 VIEW; DG C-ARM 61-120 MIN Radiation exposure  index: 2.4 mGy. COMPARISON:  None. FINDINGS: Three intraoperative fluoroscopic images of the cervical spine were obtained. The first image demonstrates surgical probe directed at C3-4 space. Last 2 images demonstrate the patient be status post surgical anterior fusion of C3-4, C4-5 and C5-6. IMPRESSION: Status post surgical anterior fusion of C3-4, C4-5 and C5-6. Good alignment of vertebral bodies is noted. Electronically Signed   By: Marijo Conception, M.D.   On: 11/17/2018 15:21   Dg Myelography Lumbar Inj Cervical  Result Date: 11/15/2018 CLINICAL DATA:  Cervical myelopathy. Multiple falls. Rapidly losing strength in arms and legs. FLUOROSCOPY TIME:  1.3 minutes corresponding to a Dose Area Product of 1533 ?Gy*m2 PROCEDURE: LUMBAR PUNCTURE FOR CERVICAL MYELOGRAM After thorough discussion of risks and benefits of the procedure including bleeding, infection, injury to nerves, blood vessels, adjacent structures as well as headache and CSF leak, written and oral informed consent was obtained. Consent was obtained by Dr. Rolla Flatten. We discussed the high likelihood of obtaining a diagnostic study. Patient was positioned prone on the fluoroscopy table. Local anesthesia was provided with 1% lidocaine without epinephrine after prepped and draped in the usual sterile fashion. Puncture was performed at L2-L3 using a 3 1/2 inch 22-gauge spinal needle via midline approach. Using a single pass through the dura, the needle was placed within the thecal sac, with return of clear CSF. 10 mL of Isovue M-300 was injected into the thecal sac, with normal opacification of the nerve roots and cauda equina consistent with free flow within the subarachnoid space. The patient was then moved to the trendelenburg position and contrast flowed into the Cervical spine region. I personally performed the lumbar puncture and administered the intrathecal contrast. I also personally supervised acquisition  of the myelogram images. TECHNIQUE:  Contiguous axial images were obtained through the Cervical spine after the intrathecal infusion of infusion. Coronal and sagittal reconstructions were obtained of the axial image sets. FINDINGS: CERVICAL MYELOGRAM FINDINGS: There is good opacification of the cervical subarachnoid space. There is a near complete block at C4-C5. insufficient contrast passes cephalad to this to assess for nerve root cut off. CT CERVICAL MYELOGRAM FINDINGS: Alignment: Degenerative rotoscoliosis convex LEFT mid cervical region. Degenerative anterolisthesis of 2 mm at C2-3, 2 mm at C4-5, 3 mm at C5-6, and 2 mm at C7-T1. Vertebrae: Solid arthrodesis C6-C7. No worrisome osseous lesion. Cord: Severe cord compression at C3-4 and C4-5, canal diameter 5 mm. Mild dorsal displacement of the cord at C2-3, not significantly compressive. Posterior Fossa: No tonsillar herniation. Paraspinal tissues: Unremarkable. Disc levels: C2-3: 2 mm degenerative anterolisthesis. Central protrusion. Effacement anterior subarachnoid space and minimal flattening of the ventral aspect of the cord without significant stenosis. RIGHT greater than LEFT C3 foraminal narrowing due to facet arthropathy and osseous spurring. C3-4: Central protrusion with osseous spurring. Facet arthropathy. Ligamentum flavum infolding. Severe stenosis with cord compression. Canal diameter 5 mm. BILATERAL C4 foraminal narrowing. C4-5: 2 mm anterolisthesis. Central protrusion with osseous spurring. Facet arthropathy. Ligamentum flavum infolding. Severe stenosis with cord compression. Canal diameter 5 mm. C5-6: 2 mm anterolisthesis. Annular bulge with osseous spurring. Facet arthropathy. LEFT C6 foraminal narrowing. No cord compression or significant stenosis. C6-7:  Solid arthrodesis. No impingement. C7-T1: 2 mm anterolisthesis. Facet arthropathy. Ligamentum flavum infolding. No significant cord compression. Borderline RIGHT greater than LEFT C8 foraminal narrowing. IMPRESSION: Near-total  block at C4-5 on myelography. Degenerative rotoscoliosis convex LEFT mid cervical region Severe spinal stenosis with cord compression at C3-4 and C4-5, related to ventral disc material and osseous spurring, as well as posterior element hypertrophy with ligamentum flavum infolding. Canal diameter 5 mm at both C4 and C5. Solid arthrodesis C6-C7. Electronically Signed   By: Staci Righter M.D.   On: 11/15/2018 13:45    Antibiotics:  Anti-infectives (From admission, onward)   Start     Dose/Rate Route Frequency Ordered Stop   11/17/18 2100  ceFAZolin (ANCEF) IVPB 1 g/50 mL premix     1 g 100 mL/hr over 30 Minutes Intravenous Every 8 hours 11/17/18 1745 11/18/18 0624   11/17/18 1306  bacitracin 50,000 Units in sodium chloride 0.9 % 500 mL irrigation  Status:  Discontinued       As needed 11/17/18 1307 11/17/18 1510   11/17/18 1100  ceFAZolin (ANCEF) IVPB 2g/100 mL premix     2 g 200 mL/hr over 30 Minutes Intravenous To Short Stay 11/17/18 0728 11/17/18 1245      Discharge Exam: Blood pressure (!) 147/87, pulse 91, temperature 98.6 F (37 C), temperature source Oral, resp. rate 17, height 6\' 3"  (1.905 m), weight 94.3 kg, SpO2 100 %. Neurologic: Grossly normal with stable weakness of handgrip and right shoulder Dressing clean dry and intact  Discharge Medications:   Allergies as of 11/20/2018      Reactions   Morphine Itching   Can take with benadry, facial and nose itching      Medication List    STOP taking these medications   celecoxib 200 MG capsule Commonly known as:  CELEBREX     TAKE these medications   aspirin 81 MG tablet Take 81 mg by mouth daily.   atorvastatin 20 MG tablet Commonly known as:  LIPITOR Take 20 mg by mouth daily at 6 PM.  buPROPion 200 MG 12 hr tablet Commonly known as:  WELLBUTRIN SR Take 200 mg by mouth 2 (two) times daily.   busPIRone 10 MG tablet Commonly known as:  BUSPAR Take 10 mg by mouth 2 (two) times daily.   CALCIUM CITRATE + PO Take  1 tablet by mouth daily.   clonazePAM 1 MG tablet Commonly known as:  KLONOPIN Take 1 mg by mouth 2 (two) times daily as needed for anxiety.   DULoxetine 60 MG capsule Commonly known as:  CYMBALTA Take 60 mg by mouth daily.   fexofenadine 180 MG tablet Commonly known as:  ALLEGRA Take 180 mg by mouth daily.   finasteride 5 MG tablet Commonly known as:  PROSCAR Take 5 mg by mouth daily.   fluticasone 50 MCG/ACT nasal spray Commonly known as:  FLONASE Place 2 sprays into the nose daily.   folic acid 1 MG tablet Commonly known as:  FOLVITE Take 1 mg by mouth daily.   furosemide 40 MG tablet Commonly known as:  LASIX Take 40 mg by mouth daily.   levothyroxine 150 MCG tablet Commonly known as:  SYNTHROID, LEVOTHROID Take 150 mcg by mouth daily.   lisinopril 5 MG tablet Commonly known as:  PRINIVIL,ZESTRIL Take 5 mg by mouth daily.   Magnesium 500 MG Caps Take 500 mg by mouth daily.   metFORMIN 1000 MG tablet Commonly known as:  GLUCOPHAGE Take 1,000 mg by mouth 2 (two) times daily with a meal.   mirtazapine 30 MG tablet Commonly known as:  REMERON Take 60 mg by mouth at bedtime.   multivitamin capsule Take 1 capsule by mouth daily.   omeprazole 20 MG capsule Commonly known as:  PRILOSEC Take 20 mg by mouth daily.   oxyCODONE-acetaminophen 10-325 MG tablet Commonly known as:  Percocet Take 1 tablet by mouth every 4 (four) hours as needed for pain.   potassium chloride 20 MEQ packet Commonly known as:  KLOR-CON Take 20 mEq by mouth 2 (two) times daily.   pramipexole 1 MG tablet Commonly known as:  MIRAPEX Take 1 mg by mouth 3 (three) times daily.   Rapaflo 8 MG Caps capsule Generic drug:  silodosin Take 8 mg by mouth daily with breakfast.   SUMAtriptan 6 MG/0.5ML Soln injection Commonly known as:  IMITREX Inject 6 mg into the skin every 2 (two) hours as needed for migraine or headache. May repeat in 2 hours if headache persists or recurs.    tiZANidine 4 MG capsule Commonly known as:  ZANAFLEX Take 1 capsule (4 mg total) by mouth 3 (three) times daily as needed for muscle spasms. What changed:    when to take this  reasons to take this   VITAMIN D PO Take 1,000 Units by mouth daily.            Durable Medical Equipment  (From admission, onward)         Start     Ordered   11/20/18 1034  DME tub bench  Once     11/20/18 1033   11/20/18 1034  DME 3-in-1  Once     11/20/18 1033   11/20/18 1033  For home use only DME standard manual wheelchair with seat cushion  (Wheelchairs)  Once    Comments:  Patient suffers from myelopathy and left foot fracture which impairs their ability to perform daily activities like bathing, dressing and toileting in the home.  A walker will not resolve  issue with performing activities of daily living.  A wheelchair will allow patient to safely perform daily activities. Patient can safely propel the wheelchair in the home or has a caregiver who can provide assistance.  Accessories: elevating leg rests (ELRs), wheel locks, extensions and anti-tippers.   11/20/18 1033          Disposition: Home with home health   Final Dx: ACDF   Foot fracture  Discharge Instructions    Call MD for:  difficulty breathing, headache or visual disturbances   Complete by:  As directed    Call MD for:  persistant nausea and vomiting   Complete by:  As directed    Call MD for:  redness, tenderness, or signs of infection (pain, swelling, redness, odor or green/yellow discharge around incision site)   Complete by:  As directed    Call MD for:  severe uncontrolled pain   Complete by:  As directed    Call MD for:  temperature >100.4   Complete by:  As directed    Diet - low sodium heart healthy   Complete by:  As directed    Increase activity slowly   Complete by:  As directed    Remove dressing in 48 hours   Complete by:  As directed       Follow-up Information    Marybelle Killings, MD. Schedule an  appointment as soon as possible for a visit today.   Specialty:  Orthopedic Surgery Why:  Needs return office visit with Dr. Lorin Mercy 2-3 weeks after hospital discharge for recheck of left foot Contact information: [place] [address] [phone]        Earnie Larsson, MD. Schedule an appointment as soon as possible for a visit in 2 week(s).   Specialty:  Neurosurgery Contact information: [address] [place] [address] [place] [address] [phone]            Signed: Eustace Moore 11/20/2018, 10:34 AM

## 2018-11-20 NOTE — TOC Transition Note (Addendum)
Transition of Care [name]) - CM/SW Discharge Note   Patient Details  Name: Antonio Oliver MRN: [id number] Date of Birth: 1955/11/10  [name] CM/SW Contact:  [name], RN Phone Number: 11/20/2018, 11:42 AM   Clinical Narrative:    Pt refusing CIR and wanting to d/c home. [name] with CIR notified of pt d/cing home.   Final next level of care: [name] Barriers to Discharge: No Barriers Identified   Patient Goals and CMS Choice Patient states their goals for this hospitalization and ongoing recovery are:: to get home CMS Medicare.gov Compare Post Acute Care list provided to:: Patient Choice offered to / list presented to : Patient  Discharge Placement                       Discharge Plan and Services Discharge Planning Services: CM Consult Post Acute Care Choice: Durable Medical Equipment, Home Health          DME Arranged: (Wheel chair cushion and sliding board. CM provided orders for these DME to pts wife)   HH Arranged: RN, PT, OT, Nurse's Aide [place] Agency: [facility]--pt already active with [name] so is a resumption of services. They accepted the resumption.   Social Determinants of Health (SDOH) Interventions     Readmission Risk Interventions No flowsheet data found.

## 2018-11-20 NOTE — Plan of Care (Signed)
  Problem: Clinical Measurements: Goal: Ability to maintain clinical measurements within normal limits will improve Outcome: Progressing   Problem: Clinical Measurements: Goal: Respiratory complications will improve Outcome: Progressing   Problem: Clinical Measurements: Goal: Cardiovascular complication will be avoided Outcome: Progressing   

## 2018-11-20 NOTE — Progress Notes (Signed)
Physical Therapy Treatment Patient Details Name: BROLY HATFIELD MRN: [id number] DOB: 22-May-1956 Today's Date: 11/20/2018    History of Present Illness 63 yo admitted with progressive bil UE weakness, gait instability and falls s/p C3-6 fusion. Pt with 2 episoses of postoperative upper airway obstruction. Pt also with recent fall and left midfoot fx currently TDWB. PMhx: C6-7ACDF, BPH, pacemaker, depression, DM, HTn, glaucoma    PT Comments    Pt showed good improvement in transfer stability and ability to transfer safely against and significant height difference.    Follow Up Recommendations  Home health PT;Supervision - Intermittent     Equipment Recommendations  None recommended by PT(16x16 w/c cushion, sliding board.)    Recommendations for Other Services       Precautions / Restrictions Precautions Precautions: Cervical;Fall Precaution Comments: Reviewed cervical precautions Required Braces or Orthoses: Cervical Brace;Other Brace Cervical Brace: Soft collar;At all times Other Brace: post op shoe on LLE per wife instruction from DR.Yates Restrictions Weight Bearing Restrictions: Yes LLE Weight Bearing: Touchdown weight bearing    Mobility  Bed Mobility Overal bed mobility: Needs Assistance Bed Mobility: Rolling;Sidelying to Sit Rolling: Supervision Sidelying to sit: Supervision       General bed mobility comments: reinforced best technique  Transfers Overall transfer level: Needs assistance   Transfers: Squat Pivot Transfers;Lateral/Scoot Transfers     Squat pivot transfers: Min guard    Lateral/Scoot Transfers: Supervision General transfer comment: discussed best techniques, positioning of the w/c.  pt able to even clear the armrest safely today.  Transfers x3  Ambulation/Gait                 Stairs             Wheelchair Mobility    Modified Rankin (Stroke Patients Only)       Balance Overall balance assessment: No apparent  balance deficits (not formally assessed)                                          Cognition Arousal/Alertness: Awake/alert Behavior During Therapy: WFL for tasks assessed/performed Overall Cognitive Status: Within Functional Limits for tasks assessed                                 General Comments: WFL for following commands and performing transfers. However, pt seems to be sad and less engaged. Pt's wife reporting that pt is more depressed than normal and does have a history of depression. RN notified and chaplian consult placed      Exercises      General Comments General comments (skin integrity, edema, etc.): Wife came in to hear the education reinforced and discussion on transfer techniques      Pertinent Vitals/Pain Pain Assessment: Faces Faces Pain Scale: Hurts a little bit Pain Location: Neck and left foot Pain Descriptors / Indicators: Discomfort;Grimacing Pain Intervention(s): Monitored during session    Home Living                      Prior Function            PT Goals (current goals can now be found in the care plan section) Acute Rehab PT Goals PT Goal Formulation: With patient/family Time For Goal Achievement: 12/02/18 Potential to Achieve Goals: Good Progress towards PT goals: Progressing toward  goals    Frequency    Min 3X/week      PT Plan Discharge plan needs to be updated    Co-evaluation              AM-PAC PT "6 Clicks" Mobility   Outcome Measure  Help needed turning from your back to your side while in a flat bed without using bedrails?: A Little Help needed moving from lying on your back to sitting on the side of a flat bed without using bedrails?: A Little Help needed moving to and from a bed to a chair (including a wheelchair)?: A Little Help needed standing up from a chair using your arms (e.g., wheelchair or bedside chair)?: A Little Help needed to walk in hospital room?: A  Little Help needed climbing 3-5 steps with a railing? : A Little 6 Click Score: 18    End of Session   Activity Tolerance: Patient tolerated treatment well Patient left: in chair;with chair alarm set;with family/visitor present;with call bell/phone within reach Nurse Communication: Mobility status PT Visit Diagnosis: Other abnormalities of gait and mobility (R26.89);Muscle weakness (generalized) (M62.81);Difficulty in walking, not elsewhere classified (R26.2);Other symptoms and signs involving the nervous system (R29.898)     Time: 9811-9147 PT Time Calculation (min) (ACUTE ONLY): 28 min  Charges:  $Therapeutic Activity: 23-37 mins                     11/20/2018  Donnella Sham, PT Acute Rehabilitation Services [phone]  (pager) [phone]  (office)   Tessie Fass Genni Buske 11/20/2018, 12:10 PM

## 2018-11-20 NOTE — Progress Notes (Signed)
Patient refused CIR, but is being discharged with Home Health. IV has been removed. Discharge education and instructions provided to patient. Leaving unit by wheelchair with NT.

## 2018-11-20 NOTE — Consult Note (Signed)
Physical Medicine and Rehabilitation Consult Reason for Consult:  Decreased functional mobility Referring Physician:  Dr. Earnie Larsson   HPI: Antonio Oliver is a 63 y.o.right handed male  ith history of diabetes mellitus continues on Glucophage 1000 mg twice a day, hypertension lisinopril 5 mg daily, Lasix 40 mg daily, depression maintained on Wellbutrin, Remeron as well as BuSpar, Cymbalta and Klonopin as needed, remote history of C6-7 anterior cervical discectomy and fusion.  History taken from chart review and patient.  Patient lives with spouse. One level home  with rampt to entry. Wife did assist with some basic ADLs. Presented 11/14/2018 with gait instabilityand numerous falls as well as a recent left nondisplaced medial cuneiform fracture status post ORIF in January and touchdown weightbearing with postoperative shoe per orthopedic services Dr. Lorin Mercy. CT and imaging revealed multilevel spondylosis/myelopathy with no definite spinal cord compression visible. Underwent C3-4, 4-5 and 5-6 anterior cervical discectomy with interbody fusion anterior plate instrumentation 11/17/2018 per Dr. Annette Stable. Hospital course pain management. Soft cervical collar at all times. Therapy evaluation completed with recommendations of physical medicine rehabilitation consult.  Review of Systems  Constitutional: Negative for chills and fever.  HENT: Negative for hearing loss.   Eyes: Negative for blurred vision and double vision.  Respiratory: Negative for cough and shortness of breath.   Cardiovascular: Positive for palpitations and leg swelling. Negative for chest pain.  Gastrointestinal: Positive for constipation. Negative for heartburn, nausea and vomiting.  Genitourinary: Positive for frequency and urgency. Negative for dysuria, flank pain and hematuria.  Musculoskeletal: Positive for falls and myalgias.  Skin: Negative for rash.  Neurological: Positive for sensory change and weakness.   Psychiatric/Behavioral: Positive for depression. The patient has insomnia.   All other systems reviewed and are negative.  Past Medical History:  Diagnosis Date  . BPH (benign prostatic hyperplasia)   . Depression   . Diabetes mellitus   . Glaucoma   . Hypertension   . Presence of permanent cardiac pacemaker    Past Surgical History:  Procedure Laterality Date  . BACK SURGERY     x4  . FOOT NEUROMA SURGERY    . GASTRIC BYPASS    . HEMORRHOID SURGERY     over 30 years ago  . HERNIA REPAIR     LIH umb  . NASAL SINUS SURGERY     x2  . NECK SURGERY     Family History  Problem Relation Age of Onset  . Cancer Mother        breast  . Heart disease Father    Social History:  reports that he has never smoked. He has never used smokeless tobacco. He reports current alcohol use. He reports that he does not use drugs. Allergies:  Allergies  Allergen Reactions  . Morphine Itching    Can take with benadry, facial and nose itching    Medications Prior to Admission  Medication Sig Dispense Refill  . aspirin 81 MG tablet Take 81 mg by mouth daily.    Marland Kitchen atorvastatin (LIPITOR) 20 MG tablet Take 20 mg by mouth daily at 6 PM.     . buPROPion (WELLBUTRIN SR) 200 MG 12 hr tablet Take 200 mg by mouth 2 (two) times daily.    . busPIRone (BUSPAR) 10 MG tablet Take 10 mg by mouth 2 (two) times daily.    . Calcium Citrate-Vitamin D (CALCIUM CITRATE + PO) Take 1 tablet by mouth daily.     . celecoxib (CELEBREX) 200 MG  capsule Take 200 mg by mouth 2 (two) times daily.    . Cholecalciferol (VITAMIN D PO) Take 1,000 Units by mouth daily.     . clonazePAM (KLONOPIN) 1 MG tablet Take 1 mg by mouth 2 (two) times daily as needed for anxiety.     . DULoxetine (CYMBALTA) 60 MG capsule Take 60 mg by mouth daily.    . fexofenadine (ALLEGRA) 180 MG tablet Take 180 mg by mouth daily.    . finasteride (PROSCAR) 5 MG tablet Take 5 mg by mouth daily.    . folic acid (FOLVITE) 1 MG tablet Take 1 mg by mouth  daily.    . furosemide (LASIX) 40 MG tablet Take 40 mg by mouth daily.    Marland Kitchen levothyroxine (SYNTHROID, LEVOTHROID) 150 MCG tablet Take 150 mcg by mouth daily.    Marland Kitchen lisinopril (PRINIVIL,ZESTRIL) 5 MG tablet Take 5 mg by mouth daily.    . Magnesium 500 MG CAPS Take 500 mg by mouth daily.     . metFORMIN (GLUCOPHAGE) 1000 MG tablet Take 1,000 mg by mouth 2 (two) times daily with a meal.    . mirtazapine (REMERON) 30 MG tablet Take 60 mg by mouth at bedtime.    . Multiple Vitamin (MULTIVITAMIN) capsule Take 1 capsule by mouth daily.    Marland Kitchen omeprazole (PRILOSEC) 20 MG capsule Take 20 mg by mouth daily.    Marland Kitchen oxyCODONE-acetaminophen (PERCOCET) 10-325 MG tablet Take 1 tablet by mouth every 4 (four) hours as needed for pain.    . potassium chloride (KLOR-CON) 20 MEQ packet Take 20 mEq by mouth 2 (two) times daily.    . pramipexole (MIRAPEX) 1 MG tablet Take 1 mg by mouth 3 (three) times daily.    . silodosin (RAPAFLO) 8 MG CAPS capsule Take 8 mg by mouth daily with breakfast.    . SUMAtriptan (IMITREX) 6 MG/0.5ML SOLN injection Inject 6 mg into the skin every 2 (two) hours as needed for migraine or headache. May repeat in 2 hours if headache persists or recurs.    Marland Kitchen tiZANidine (ZANAFLEX) 4 MG capsule Take 4 mg by mouth 3 (three) times daily.    . fluticasone (FLONASE) 50 MCG/ACT nasal spray Place 2 sprays into the nose daily.      Home: Home Living Family/patient expects to be discharged to:: Private residence Living Arrangements: Spouse/significant other Available Help at Discharge: Family, Available 24 hours/day Type of Home: House Home Access: Ramped entrance, Stairs to enter [employer] of Steps: 5 Entrance Stairs-Rails: Left Home Layout: One level Bathroom Shower/Tub: Chiropodist: Standard(BSC over) Home Equipment: Bedside commode, Grab bars - tub/shower, Grab bars - toilet, Environmental consultant - 2 wheels, Cane - single point, Wheelchair - manual, Environmental consultant - 4 wheels   Functional History: Prior Function Level of Independence: Needs assistance Gait / Transfers Assistance Needed: pt with fall with tibia fx in Jan was walking but pain increased and recently sitting greatly and limited walking with fall immediately PTA with foot fx ADL's / Homemaking Assistance Needed: progressive decline in function with assist for bathing, dressing for the last 2 weeks, has been able to self feed Functional Status:  Mobility: Bed Mobility Overal bed mobility: Needs Assistance Bed Mobility: Rolling, Sidelying to Sit Rolling: Min assist Sidelying to sit: Min guard General bed mobility comments: Min A for assistance with rolling due to pain. Pt able to push into sitting Transfers Overall transfer level: Needs assistance Equipment used: Rolling walker (2 wheeled) Transfers: Sit to/from Stand, Lateral/Scoot  Transfers Sit to Stand: Mod assist, From elevated surface  Lateral/Scoot Transfers: Min assist General transfer comment: Mod A for power up into standing and pt able to maintain standing with MIn A. Unable to shift right foot. Min A for lateral scoot to right to drop arm recliner Ambulation/Gait General Gait Details: unable due to precautions    ADL: ADL Overall ADL's : Needs assistance/impaired Eating/Feeding: Minimal assistance, Sitting Eating/Feeding Details (indicate cue type and reason): Limited ROM at shoulder and required Min A for bilateral tasks such as opening containers. Providing built up handles to increased independence. Optimizing positioning for self feeding and pt performing self feeding.  Grooming: Minimal assistance, Sitting Upper Body Bathing: Moderate assistance, Sitting Lower Body Bathing: Maximal assistance, Sit to/from stand, Sitting/lateral leans Upper Body Dressing : Minimal assistance, Sitting Lower Body Dressing: Maximal assistance, Sit to/from stand, Sitting/lateral leans Toilet Transfer: Minimal assistance, Transfer board(Lateral scoot  to drop arm recliner) Toilet Transfer Details (indicate cue type and reason): Pt performing lateral scoot to drop arm recliner.  Functional mobility during ADLs: Minimal assistance, Moderate assistance, Rolling walker(Lateral scoot vs sit<>Stand with RW) General ADL Comments: Pt with decreased balance, strength, function use of RUE, and activity tolerance due to pain.  Cognition: Cognition Overall Cognitive Status: Within Functional Limits for tasks assessed Orientation Level: Oriented X4 Cognition Arousal/Alertness: Awake/alert Behavior During Therapy: WFL for tasks assessed/performed Overall Cognitive Status: Within Functional Limits for tasks assessed General Comments: WFL for following commands and performing transfers. However, pt seems to be sad and less engaged. Pt's wife reporting that pt is more depressed than normal and does have a history of depression. RN notified and chaplian consult placed  Blood pressure (!) 160/80, pulse 91, temperature 98.5 F (36.9 C), temperature source Oral, resp. rate 17, height 6\' 3"  (1.905 m), weight 94.3 kg, SpO2 96 %. Physical Exam  Vitals reviewed. Constitutional: He is oriented to person, place, and time. He appears well-developed and well-nourished.  HENT:  Head: Normocephalic and atraumatic.  Eyes: EOM are normal. Right eye exhibits no discharge. Left eye exhibits no discharge.  Neck:  Soft collar in place   Cardiovascular: Normal rate and regular rhythm.  Respiratory: Effort normal and breath sounds normal. No respiratory distress.  GI: Soft. Bowel sounds are normal. He exhibits no distension.  Musculoskeletal:     Comments: No edema or tenderness in extremities  Neurological: He is alert and oriented to person, place, and time.  Moving extremities  Skin:  Shoe boot left foot  Psychiatric: He has a normal mood and affect. His behavior is normal.    Results for orders placed or performed during the hospital encounter of 11/14/18 (from  the past 24 hour(s))  Glucose, capillary     Status: Abnormal   Collection Time: 11/19/18  7:55 AM  Result Value Ref Range   Glucose-Capillary 100 (H) 70 - 99 mg/dL   Comment 1 Notify RN    Comment 2 Document in Chart   Glucose, capillary     Status: Abnormal   Collection Time: 11/19/18 12:04 PM  Result Value Ref Range   Glucose-Capillary 158 (H) 70 - 99 mg/dL   Comment 1 Notify RN    Comment 2 Document in Chart   Glucose, capillary     Status: Abnormal   Collection Time: 11/19/18  5:06 PM  Result Value Ref Range   Glucose-Capillary 161 (H) 70 - 99 mg/dL   Comment 1 Notify RN    Comment 2 Document in Chart  Glucose, capillary     Status: Abnormal   Collection Time: 11/19/18  9:36 PM  Result Value Ref Range   Glucose-Capillary 106 (H) 70 - 99 mg/dL   No results found.  Assessment/Plan: Diagnosis: Gait instability Labs independently reviewed.  Records reviewed and summated above.  1. Does the need for close, 24 hr/day medical supervision in concert with the patient's rehab needs make it unreasonable for this patient to be served in a less intensive setting? No  2. Co-Morbidities requiring supervision/potential complications: DM (Monitor in accordance with exercise and adjust meds as necessary), HTN (monitor and provide prns in accordance with increased physical exertion and pain), depression (ensure mood does not hinder progress of therapies),remote history of C6-7 anterior cervical discectomy and fusion, leukocytosis (repeat labs, cont to monitor for signs and symptoms of infection, further workup if indicated) 3. Due to safety, skin/wound care, disease management, pain management and patient education, does the patient require 24 hr/day rehab nursing? No 4. Does the patient require coordinated care of a physician, rehab nurse, PT (1-2 hrs/day, 5 days/week) and OT (1-2 hrs/day, 5 days/week) to address physical and functional deficits in the context of the above medical diagnosis(es)?  No Addressing deficits in the following areas: balance, endurance, locomotion, strength, transferring, bathing, dressing, toileting and psychosocial support 5. Can the patient actively participate in an intensive therapy program of at least 3 hrs of therapy per day at least 5 days per week? Yes 6. The potential for patient to make measurable gains while on inpatient rehab is excellent 7. Anticipated functional outcomes upon discharge from inpatient rehab are modified independent  with PT, modified independent with OT, n/a with SLP. 8. Estimated rehab length of stay to reach the above functional goals is: 7-11 days. 9. Anticipated D/C setting: Home 10. Anticipated post D/C treatments: HH therapy and Home excercise program 11. Overall Rehab/Functional Prognosis: good  RECOMMENDATIONS: This patient's condition is appropriate for continued rehabilitative care in the following setting: Patient has decided to discharge home.I will believe this is acceptable.  Recommend home with home health. Patient has agreed to participate in recommended program. Yes Note that insurance prior authorization may be required for reimbursement for recommended care.  Comment: Rehab Admissions Coordinator to follow up.   I have personally performed a face to face diagnostic evaluation, including, but not limited to relevant history and physical exam findings, of this patient and developed relevant assessment and plan.  Additionally, I have reviewed and concur with the physician assistant's documentation above.   Delice Lesch, MD, ABPMR Lavon Paganini Angiulli, PA-C 11/20/2018

## 2018-11-22 ENCOUNTER — Encounter (HOSPITAL_COMMUNITY): Payer: Self-pay | Admitting: Neurosurgery

## 2018-11-24 ENCOUNTER — Telehealth: Payer: Self-pay | Admitting: Cardiology

## 2018-11-24 NOTE — Telephone Encounter (Signed)
[facility]

## 2018-11-28 ENCOUNTER — Other Ambulatory Visit: Payer: Medicare Other

## 2018-11-28 ENCOUNTER — Telehealth (INDEPENDENT_AMBULATORY_CARE_PROVIDER_SITE_OTHER): Payer: Self-pay | Admitting: Radiology

## 2018-11-28 NOTE — Telephone Encounter (Signed)
I spoke with patient and confirmed appointment with Dr. Lorin Mercy for 0830 on 11/29/2018.  Patient answered "No" to all COVID-19 screening questions.

## 2018-11-29 ENCOUNTER — Ambulatory Visit (INDEPENDENT_AMBULATORY_CARE_PROVIDER_SITE_OTHER): Payer: Self-pay | Admitting: Orthopaedic Surgery

## 2018-11-29 NOTE — Telephone Encounter (Signed)
Please rate accuity and resend to the CV Pool plese.

## 2018-11-29 NOTE — Telephone Encounter (Signed)
2-3 months out

## 2018-12-04 ENCOUNTER — Telehealth (INDEPENDENT_AMBULATORY_CARE_PROVIDER_SITE_OTHER): Payer: Self-pay | Admitting: *Deleted

## 2018-12-04 NOTE — Telephone Encounter (Signed)
Prescreened pt for COVID 19 for appt scheduled 12/05/2018 and pt answered NO to all questions 

## 2018-12-05 ENCOUNTER — Ambulatory Visit (INDEPENDENT_AMBULATORY_CARE_PROVIDER_SITE_OTHER): Payer: Self-pay | Admitting: Orthopaedic Surgery

## 2018-12-06 ENCOUNTER — Telehealth: Payer: Self-pay | Admitting: *Deleted

## 2018-12-06 NOTE — Telephone Encounter (Signed)
Called patient to let them know due to recent [place] and Health Department Protocols, we are not seeing patients in the office. We are instead seeing if they would like to schedule this appointment as a Research scientist (medical) or Laptop. Patient is aware if they decide to reschedule this appointment, they may not be seen or scheduled for the next 4-6 months. Patient is agreeable to WebEx. WebEx Virtual Visit Information given over the phone.  Patient will call us back if they are having issues uploading the app. Patient was advised to have pencil/pen and paper ready to take notes before, during and after the Virtual visit, to have BP, HR, and recent Wt, and any other concerns and questions ready prior to start of appointment.  Patient advised appointment times are set like a regular schedule and will begin possibly 10 minutes before set time. Appointment is not a floating schedule for that day.

## 2018-12-07 ENCOUNTER — Telehealth (INDEPENDENT_AMBULATORY_CARE_PROVIDER_SITE_OTHER): Payer: Self-pay | Admitting: Radiology

## 2018-12-07 NOTE — Telephone Encounter (Signed)
Patient rescheduled appointment from last week. Has answered "No" to all COVID-19 screening questions.

## 2018-12-08 ENCOUNTER — Encounter (INDEPENDENT_AMBULATORY_CARE_PROVIDER_SITE_OTHER): Payer: Self-pay | Admitting: Orthopaedic Surgery

## 2018-12-08 ENCOUNTER — Other Ambulatory Visit: Payer: Self-pay

## 2018-12-08 ENCOUNTER — Ambulatory Visit (INDEPENDENT_AMBULATORY_CARE_PROVIDER_SITE_OTHER): Payer: Medicare Other

## 2018-12-08 ENCOUNTER — Ambulatory Visit (INDEPENDENT_AMBULATORY_CARE_PROVIDER_SITE_OTHER): Payer: Medicare Other | Admitting: Orthopaedic Surgery

## 2018-12-08 VITALS — Ht 75.0 in | Wt 194.0 lb

## 2018-12-08 DIAGNOSIS — S92245A Nondisplaced fracture of medial cuneiform of left foot, initial encounter for closed fracture: Secondary | ICD-10-CM

## 2018-12-08 DIAGNOSIS — M25572 Pain in left ankle and joints of left foot: Secondary | ICD-10-CM | POA: Diagnosis not present

## 2018-12-08 NOTE — Progress Notes (Signed)
Office Visit Note   Patient: Antonio Oliver           Date of Birth: 04/23/1956           MRN: [id number] Visit Date: 12/08/2018              Requested by: Cher Nakai, MD [address] [place], [place] [address] PCP: Cher Nakai, MD   Assessment & Plan: Visit Diagnoses:  1. Nondisplaced fracture of medial cuneiform of left foot, initial encounter for closed fracture   2. Pain in left ankle and joints of left foot     Plan: Tibial fracture appears to be healing.  He can 50% weightbearing with home health physical therapy and progress as tolerated.  If he has increased ankle pain or notes ankle effusion then he needs to back off and contact us.  We will check him in 7 weeks with AP lateral tib-fib x-rays from knee to ankle for review.  We discussed if his fracture continues to heal and there is no penetration into the ankle joint then rod removal would not be necessary.  Follow-Up Instructions: Return in about 7 weeks (around 01/26/2019).   Orders:  Orders Placed This Encounter  Procedures  . XR Ankle Complete Left  . XR Foot Complete Left   No orders of the defined types were placed in this encounter.     Procedures: No procedures performed   Clinical Data: No additional findings.   Subjective: Chief Complaint  Patient presents with  . Left Ankle - Pain  . Left Foot - Fracture    DOI 11/14/2018    HPI patient returns at 3 level fusion for cervical stenosis by neurosurgery service.  Previous tibial nail for distal third comminuted tibial fracture.  He has been using his cam boot just walking to the bathroom.  He has had some pain in his foot and is continued use teds for the swelling.  We had recommended limited  weightbearing due to concern that the nail is immediately adjacent to the joint and proximally had a bit interlock screw.  Review of Systems unchanged of note is chronic cervical problems, diabetes, hyperlipidemia depression and anxiety thyroid  condition hypertension.  Tibial fracture treated with intramedullary nail at [facility] as outlined in his previous consultation.   Objective: Vital Signs: Ht 6\' 3"  (1.905 m)   Wt 194 lb (88 kg)   BMI 24.25 kg/m   Physical Exam no change.  Patient and his collar immobilized.  Ortho Exam patient has some pitting edema of the foot there is minimal tenderness over the medial foot over the cuneiforms.  No significant ankle effusion is noted.  Multiple scars noted dorsally over the foot from previous neuroma surgeries in the past that are healed.  No plantar foot ulcers.  There is a prominent palpable callus formation noted distal third medial anterior corresponding with x-ray.  Specialty Comments:  No specialty comments available.  Imaging: No results found.   PMFS History: Patient Active Problem List   Diagnosis Date Noted  . Surgery, elective   . Weakness   . Diabetes mellitus type 2 in nonobese (HCC)   . Leukocytosis   . Nondisplaced fracture of medial cuneiform of left foot, initial encounter for closed fracture 11/16/2018  . Cervical myelopathy ([place]) 11/14/2018  . Diabetes ([place]) 10/30/2018  . Hyperlipidemia 10/30/2018  . Depression 10/30/2018  . Anxiety 10/30/2018  . Vitamin D deficiency 10/30/2018  . Osteoarthritis 10/30/2018  . GERD (  gastroesophageal reflux disease) 10/30/2018  . Fatigue 10/30/2018  . Hypothyroidism 10/30/2018  . Essential hypertension 10/30/2018  . Mixed dyslipidemia 10/30/2018  . Diabetes mellitus due to underlying condition with unspecified complications ([place]) 39/76/7341  . Morbid obesity ([place]) 10/27/2011   Past Medical History:  Diagnosis Date  . BPH (benign prostatic hyperplasia)   . Depression   . Diabetes mellitus   . Glaucoma   . Hypertension   . Presence of permanent cardiac pacemaker     Family History  Problem Relation Age of Onset  . Cancer Mother        breast  . Heart disease Father     Past Surgical History:   Procedure Laterality Date  . ANTERIOR CERVICAL DECOMP/DISCECTOMY FUSION N/A 11/17/2018   Procedure: CERVICAL THREE-CERVICAL FOUR, CERVICAL FOUR-CERVICAL FIVE, CERVICAL FIVE-CERVICAL SIX ANTERIOR CERVICAL DECOMPRESSION/DISCECTOMY FUSION;  Surgeon: Earnie Larsson, MD;  Location: [facility];  Service: Neurosurgery;  Laterality: N/A;  . BACK SURGERY     x4  . FOOT NEUROMA SURGERY    . GASTRIC BYPASS    . HEMORRHOID SURGERY     over 30 years ago  . HERNIA REPAIR     LIH umb  . NASAL SINUS SURGERY     x2  . NECK SURGERY     Social History   Occupational History  . Not on file  Tobacco Use  . Smoking status: Never Smoker  . Smokeless tobacco: Never Used  Substance and Sexual Activity  . Alcohol use: Yes    Comment: occ.  . Drug use: No  . Sexual activity: Not on file

## 2018-12-14 ENCOUNTER — Telehealth: Payer: Self-pay | Admitting: *Deleted

## 2018-12-14 NOTE — Telephone Encounter (Signed)
Request for pt to be released in [facility] submitted. Called Jessica w/ [facility] and LMOVM for pt to be released in [employer].

## 2018-12-14 NOTE — Telephone Encounter (Signed)
Follow Up:     Pt returning your call from today. 

## 2018-12-14 NOTE — Telephone Encounter (Signed)
Appointment 4/13 @ 8:45 with Venancio Poisson [facility] Information  Device Model # [id number] S# [id number]  Transmitter Model # [id number] S# [id number]

## 2018-12-15 ENCOUNTER — Ambulatory Visit (INDEPENDENT_AMBULATORY_CARE_PROVIDER_SITE_OTHER): Payer: Medicare Other | Admitting: *Deleted

## 2018-12-15 ENCOUNTER — Other Ambulatory Visit: Payer: Self-pay

## 2018-12-15 DIAGNOSIS — I442 Atrioventricular block, complete: Secondary | ICD-10-CM

## 2018-12-15 DIAGNOSIS — I495 Sick sinus syndrome: Secondary | ICD-10-CM

## 2018-12-15 LAB — CUP PACEART REMOTE DEVICE CHECK
Date Time Interrogation Session: 20200410102341
Implantable Lead Implant Date: 20180710
Implantable Lead Implant Date: 20180710
Implantable Lead Location: 753859
Implantable Lead Location: 753860
Implantable Pulse Generator Implant Date: 20180710
Pulse Gen Model: 2272
Pulse Gen Serial Number: 8912213

## 2018-12-15 NOTE — Telephone Encounter (Signed)
Transfer received - transmission in [employer], [place] 12/15/2018 10:20 AM

## 2018-12-18 ENCOUNTER — Encounter: Payer: Self-pay | Admitting: Cardiology

## 2018-12-18 ENCOUNTER — Telehealth: Payer: Self-pay | Admitting: *Deleted

## 2018-12-18 ENCOUNTER — Other Ambulatory Visit: Payer: Self-pay

## 2018-12-18 ENCOUNTER — Telehealth (INDEPENDENT_AMBULATORY_CARE_PROVIDER_SITE_OTHER): Payer: Medicare Other | Admitting: Cardiology

## 2018-12-18 DIAGNOSIS — Z950 Presence of cardiac pacemaker: Secondary | ICD-10-CM

## 2018-12-18 NOTE — Progress Notes (Signed)
Electrophysiology TeleHealth Note   Due to national recommendations of social distancing due to COVID 19, an audio/video telehealth visit is felt to be most appropriate for this patient at this time.  See MyChart message from today for the patient's consent to telehealth for [name].   Date:  12/18/2018   ID:  Antonio Oliver, DOB 08-12-1956, MRN [id number]  Location: patient's home  Provider location: [address], [place]  Evaluation Performed: Follow-up visit  PCP:  Cher Nakai, MD  Cardiologist:  Revankar Electrophysiologist:  Dr Curt Bears  Chief Complaint:  pacemaker  History of Present Illness:    Antonio Oliver is a 63 y.o. male who presents via audio/video conferencing for a telehealth visit today.  Since last being seen in our clinic, the patient reports doing very well.  Today, he denies symptoms of palpitations, chest pain, shortness of breath,  lower extremity edema, dizziness, presyncope, or syncope.  The patient is otherwise without complaint today.  The patient denies symptoms of fevers, chills, cough, or new SOB worrisome for COVID 19.  He presents as a referral from Dr. Geraldo Pitter.  Referral is for evaluation of his permanent pacemaker.  He also has a history of type 2 diabetes, hyperlipidemia, hypertension.  He is status post aortic valve replacement.  His pacemaker was implanted at the time of his aortic valve replacement for postoperative complete heart block..  Today, denies symptoms of palpitations, chest pain, shortness of breath, orthopnea, PND, lower extremity edema, claudication, dizziness, presyncope, syncope, bleeding, or neurologic sequela. The patient is tolerating medications without difficulties.  Unfortunately, he had a fall in mid March.  This required cervical spine surgery as well as a rod placed in his lower leg.  He is continuing to heal from that.  He says that his heart rate has been rather fast since his operation.  Otherwise he  has no major complaints.  Past Medical History:  Diagnosis Date  . BPH (benign prostatic hyperplasia)   . Depression   . Diabetes mellitus   . Glaucoma   . Hypertension   . Presence of permanent cardiac pacemaker     Past Surgical History:  Procedure Laterality Date  . ANTERIOR CERVICAL DECOMP/DISCECTOMY FUSION N/A 11/17/2018   Procedure: CERVICAL THREE-CERVICAL FOUR, CERVICAL FOUR-CERVICAL FIVE, CERVICAL FIVE-CERVICAL SIX ANTERIOR CERVICAL DECOMPRESSION/DISCECTOMY FUSION;  Surgeon: Earnie Larsson, MD;  Location: [facility];  Service: Neurosurgery;  Laterality: N/A;  . BACK SURGERY     x4  . FOOT NEUROMA SURGERY    . GASTRIC BYPASS    . HEMORRHOID SURGERY     over 30 years ago  . HERNIA REPAIR     LIH umb  . NASAL SINUS SURGERY     x2  . NECK SURGERY      Current Outpatient Medications  Medication Sig Dispense Refill  . aspirin 81 MG tablet Take 81 mg by mouth daily.    Marland Kitchen atorvastatin (LIPITOR) 20 MG tablet Take 20 mg by mouth daily at 6 PM.     . buPROPion (WELLBUTRIN SR) 200 MG 12 hr tablet Take 200 mg by mouth 2 (two) times daily.    . busPIRone (BUSPAR) 10 MG tablet Take 10 mg by mouth 2 (two) times daily.    . Calcium Citrate-Vitamin D (CALCIUM CITRATE + PO) Take 1 tablet by mouth daily.     . Cholecalciferol (VITAMIN D PO) Take 1,000 Units by mouth daily.     . clonazePAM (KLONOPIN) 1 MG tablet  Take 1 mg by mouth 2 (two) times daily as needed for anxiety.     . DULoxetine (CYMBALTA) 60 MG capsule Take 60 mg by mouth daily.    . fexofenadine (ALLEGRA) 180 MG tablet Take 180 mg by mouth daily.    . finasteride (PROSCAR) 5 MG tablet Take 5 mg by mouth daily.    . fluticasone (FLONASE) 50 MCG/ACT nasal spray Place 2 sprays into the nose daily.    . folic acid (FOLVITE) 1 MG tablet Take 1 mg by mouth daily.    . furosemide (LASIX) 40 MG tablet Take 40 mg by mouth daily.    Marland Kitchen levothyroxine (SYNTHROID, LEVOTHROID) 150 MCG tablet Take 150 mcg by mouth daily.    Marland Kitchen lisinopril  (PRINIVIL,ZESTRIL) 5 MG tablet Take 5 mg by mouth daily.    . Magnesium 500 MG CAPS Take 500 mg by mouth daily.     . metFORMIN (GLUCOPHAGE) 1000 MG tablet Take 1,000 mg by mouth 2 (two) times daily with a meal.    . mirtazapine (REMERON) 30 MG tablet Take 60 mg by mouth at bedtime.    . Multiple Vitamin (MULTIVITAMIN) capsule Take 1 capsule by mouth daily.    Marland Kitchen omeprazole (PRILOSEC) 20 MG capsule Take 20 mg by mouth daily.    Marland Kitchen oxyCODONE-acetaminophen (PERCOCET) 10-325 MG tablet Take 1 tablet by mouth every 4 (four) hours as needed for pain. 40 tablet 0  . potassium chloride (KLOR-CON) 20 MEQ packet Take 20 mEq by mouth 2 (two) times daily.    . pramipexole (MIRAPEX) 1 MG tablet Take 1 mg by mouth 3 (three) times daily.    . silodosin (RAPAFLO) 8 MG CAPS capsule Take 8 mg by mouth daily with breakfast.    . SUMAtriptan (IMITREX) 6 MG/0.5ML SOLN injection Inject 6 mg into the skin every 2 (two) hours as needed for migraine or headache. May repeat in 2 hours if headache persists or recurs.    Marland Kitchen tiZANidine (ZANAFLEX) 4 MG capsule Take 1 capsule (4 mg total) by mouth 3 (three) times daily as needed for muscle spasms. 30 capsule 1   No current facility-administered medications for this visit.     Allergies:   Morphine   Social History:  The patient  reports that he has never smoked. He has never used smokeless tobacco. He reports current alcohol use. He reports that he does not use drugs.   Family History:  The patient's  family history includes Cancer in his mother; Heart disease in his father.   ROS:  Please see the history of present illness.   All other systems are personally reviewed and negative.    Exam:    Vital Signs:  There were no vitals taken for this visit.  Well appearing, alert and conversant, regular work of breathing,  good skin color Eyes- anicteric, neuro- grossly intact, skin- no apparent rash or lesions or cyanosis, mouth- oral mucosa is pink   Labs/Other Tests and  Data Reviewed:    Recent Labs: 11/14/2018: ALT 39; BUN 8; Creatinine, Ser 0.80; Hemoglobin 10.8; Platelets 253; Potassium 4.4; Sodium 136   Wt Readings from Last 3 Encounters:  12/08/18 194 lb (88 kg)  11/17/18 207 lb 14.3 oz (94.3 kg)  10/30/18 214 lb (97.1 kg)     Other studies personally reviewed: Additional studies/ records that were reviewed today include: ECG 11/15/18  Review of the above records today demonstrates:  Sinus rhythm, LVH  Last device remote is reviewed from [facility] PDF  dated 12/15/18 which reveals normal device function, brief 1:1 tachyardia with noise on A and V leads on 3/13.   ASSESSMENT & PLAN:    1.  Complete heart block: Occurred post aVR.  He is now status post [place] dual-chamber pacemaker implanted 03/15/2017.  Device functioning appropriately.  He did have noise on his atrial and ventricular leads, but this does correspond to cervical spine surgery.  Device function otherwise without issue.  No changes.  2.  Bicuspid aortic valve with severe stenosis: s/p aortic valve replacement. Transthoracic echo pending.  Plan per primary cardiology.  3. Hypertension: Blood pressure is elevated today, but on recent check has been normal.  We Issa Luster make no changes.  COVID 19 screen The patient denies symptoms of COVID 19 at this time.  The importance of social distancing was discussed today.  Follow-up: 1 year  Current medicines are reviewed at length with the patient today.   The patient does not have concerns regarding his medicines.  The following changes were made today:  none  Labs/ tests ordered today include:  No orders of the defined types were placed in this encounter.  Despite multiple efforts to use video conferencing, the visit was switched to a telephone visit.  Patient Risk:  after full review of this patients clinical status, I feel that they are at moderate risk at this time.  Today, I have spent 20 minutes with the patient with telehealth  technology discussing pacemaker, sinus tachycardia.    Signed, Berlin Viereck Meredith Leeds, MD  12/18/2018 8:51 AM     [facility] [address] [place] [address] [phone] (office) [phone] (fax)

## 2018-12-18 NOTE — Telephone Encounter (Signed)
Virtual Visit Pre-Appointment Phone Call  Steps For Call:  1. Confirm consent - "In the setting of the current Covid19 crisis, you are scheduled for a (phone or video) visit with your provider on (date) at (time).  Just as we do with many in-office visits, in order for you to participate in this visit, we must obtain consent.  If you'd like, I can send this to your mychart (if signed up) or email for you to review.  Otherwise, I can obtain your verbal consent now.  All virtual visits are billed to your insurance company just like a normal visit would be.  By agreeing to a virtual visit, we'd like you to understand that the technology does not allow for your provider to perform an examination, and thus may limit your provider's ability to fully assess your condition.  Finally, though the technology is pretty good, we cannot assure that it will always work on either your or our end, and in the setting of a video visit, we may have to convert it to a phone-only visit.  In either situation, we cannot ensure that we have a secure connection.  Are you willing to proceed?"  2. Give patient instructions for WebEx download to smartphone as below if video visit  3. Advise patient to be prepared with any vital sign or heart rhythm information, their current medicines, and a piece of paper and pen handy for any instructions they may receive the day of their visit  4. Inform patient they will receive a phone call 15 minutes prior to their appointment time (may be from unknown caller ID) so they should be prepared to answer  5. Confirm that appointment type is correct in Epic appointment notes (video vs telephone)    TELEPHONE CALL NOTE  Antonio Oliver has been deemed a candidate for a follow-up tele-health visit to limit community exposure during the Covid-19 pandemic. I spoke with the patient via phone to ensure availability of phone/video source, confirm preferred email & phone number, and discuss  instructions and expectations.  I reminded Antonio Oliver to be prepared with any vital sign and/or heart rhythm information that could potentially be obtained via home monitoring, at the time of his visit. I reminded Antonio Oliver to expect a phone call at the time of his visit if his visit.  Did the patient verbally acknowledge consent to treatment? YES  Stanton Kidney, RN 12/18/2018 8:11 AM   DOWNLOADING THE WEBEX SOFTWARE TO SMARTPHONE  - If Apple, go to [employer] and type in WebEx in the search bar. [place] [employer], the blue/green circle. The app is free but as with any other app downloads, their phone may require them to verify saved payment information or Apple password. The patient does NOT have to create an account.  - If Android, ask patient to go to [employer] and type in WebEx in the search bar. [place] [employer], the blue/green circle. The app is free but as with any other app downloads, their phone may require them to verify saved payment information or Android password. The patient does NOT have to create an account.   CONSENT FOR TELE-HEALTH VISIT - PLEASE REVIEW  I hereby voluntarily request, consent and authorize [facility] and its employed or contracted physicians, physician assistants, nurse practitioners or other licensed health care professionals (the Practitioner), to provide me with telemedicine health care services (the "Services") as deemed necessary by the treating Practitioner.  I acknowledge and consent to receive the Services by the Practitioner via telemedicine. I understand that the telemedicine visit will involve communicating with the Practitioner through live audiovisual communication technology and the disclosure of certain medical information by electronic transmission. I acknowledge that I have been given the opportunity to request an in-person assessment or other available alternative prior to the telemedicine visit and  am voluntarily participating in the telemedicine visit.  I understand that I have the right to withhold or withdraw my consent to the use of telemedicine in the course of my care at any time, without affecting my right to future care or treatment, and that the Practitioner or I may terminate the telemedicine visit at any time. I understand that I have the right to inspect all information obtained and/or recorded in the course of the telemedicine visit and may receive copies of available information for a reasonable fee.  I understand that some of the potential risks of receiving the Services via telemedicine include:  Marland Kitchen Delay or interruption in medical evaluation due to technological equipment failure or disruption; . Information transmitted may not be sufficient (e.g. poor resolution of images) to allow for appropriate medical decision making by the Practitioner; and/or  . In rare instances, security protocols could fail, causing a breach of personal health information.  Furthermore, I acknowledge that it is my responsibility to provide information about my medical history, conditions and care that is complete and accurate to the best of my ability. I acknowledge that Practitioner's advice, recommendations, and/or decision may be based on factors not within their control, such as incomplete or inaccurate data provided by me or distortions of diagnostic images or specimens that may result from electronic transmissions. I understand that the practice of medicine is not an exact science and that Practitioner makes no warranties or guarantees regarding treatment outcomes. I acknowledge that I will receive a copy of this consent concurrently upon execution via email to the email address I last provided but may also request a printed copy by calling the office of [place].    I understand that my insurance will be billed for this visit.   I have read or had this consent read to me. . I understand the  contents of this consent, which adequately explains the benefits and risks of the Services being provided via telemedicine.  . I have been provided ample opportunity to ask questions regarding this consent and the Services and have had my questions answered to my satisfaction. . I give my informed consent for the services to be provided through the use of telemedicine in my medical care  By participating in this telemedicine visit I agree to the above.

## 2018-12-25 NOTE — Progress Notes (Signed)
Remote pacemaker transmission.   

## 2019-01-24 ENCOUNTER — Ambulatory Visit: Payer: Self-pay | Admitting: Orthopaedic Surgery

## 2019-01-30 ENCOUNTER — Telehealth: Payer: Self-pay | Admitting: Orthopaedic Surgery

## 2019-01-30 NOTE — Telephone Encounter (Signed)
Records re faxed to [facility],received call from pts wife stating she was advised from them that they had not received the records that were faxed 5/21

## 2019-01-31 ENCOUNTER — Telehealth: Payer: Self-pay | Admitting: Cardiology

## 2019-01-31 NOTE — Telephone Encounter (Signed)
appt changed from office to virtual

## 2019-01-31 NOTE — Telephone Encounter (Signed)
Patent wants to be seen in the office for 5/28. Please evaluate.

## 2019-01-31 NOTE — Telephone Encounter (Signed)
I spoke to the wife and there was some miscommunication and she would prefer a virtual visit for her husband.

## 2019-02-01 ENCOUNTER — Other Ambulatory Visit: Payer: Self-pay

## 2019-02-01 ENCOUNTER — Telehealth (INDEPENDENT_AMBULATORY_CARE_PROVIDER_SITE_OTHER): Payer: Medicare Other | Admitting: Cardiology

## 2019-02-01 ENCOUNTER — Encounter: Payer: Self-pay | Admitting: Cardiology

## 2019-02-01 VITALS — BP 118/77 | HR 99 | Ht 75.0 in | Wt 194.0 lb

## 2019-02-01 DIAGNOSIS — I350 Nonrheumatic aortic (valve) stenosis: Secondary | ICD-10-CM | POA: Diagnosis not present

## 2019-02-01 DIAGNOSIS — R42 Dizziness and giddiness: Secondary | ICD-10-CM

## 2019-02-01 DIAGNOSIS — R001 Bradycardia, unspecified: Secondary | ICD-10-CM | POA: Insufficient documentation

## 2019-02-01 DIAGNOSIS — Z950 Presence of cardiac pacemaker: Secondary | ICD-10-CM

## 2019-02-01 DIAGNOSIS — N200 Calculus of kidney: Secondary | ICD-10-CM

## 2019-02-01 DIAGNOSIS — E782 Mixed hyperlipidemia: Secondary | ICD-10-CM

## 2019-02-01 DIAGNOSIS — R5383 Other fatigue: Secondary | ICD-10-CM

## 2019-02-01 DIAGNOSIS — E119 Type 2 diabetes mellitus without complications: Secondary | ICD-10-CM

## 2019-02-01 DIAGNOSIS — Z952 Presence of prosthetic heart valve: Secondary | ICD-10-CM | POA: Diagnosis not present

## 2019-02-01 DIAGNOSIS — I10 Essential (primary) hypertension: Secondary | ICD-10-CM

## 2019-02-01 DIAGNOSIS — I442 Atrioventricular block, complete: Secondary | ICD-10-CM

## 2019-02-01 HISTORY — DX: Calculus of kidney: N20.0

## 2019-02-01 HISTORY — DX: Nonrheumatic aortic (valve) stenosis: I35.0

## 2019-02-01 HISTORY — DX: Bradycardia, unspecified: R00.1

## 2019-02-01 NOTE — Addendum Note (Signed)
Addended by: Beckey Rutter on: 02/01/2019 05:03 PM   Modules accepted: Orders

## 2019-02-01 NOTE — Progress Notes (Signed)
Virtual Visit via Video Note   This visit type was conducted due to national recommendations for restrictions regarding the COVID-19 Pandemic (e.g. social distancing) in an effort to limit this patient's exposure and mitigate transmission in our community.  Due to his co-morbid illnesses, this patient is at least at moderate risk for complications without adequate follow up.  This format is felt to be most appropriate for this patient at this time.  All issues noted in this document were discussed and addressed.  A limited physical exam was performed with this format.  Please refer to the patient's chart for his consent to telehealth for [facility].   Date:  02/01/2019   ID:  Antonio Oliver, DOB [birth year], MRN [id number]  Patient Location: Home Provider Location: Office  PCP:  Cher Nakai, MD  Cardiologist:  No primary care provider on file.  Electrophysiologist:  [name], MD   Evaluation Performed:  Follow-Up Visit  Chief Complaint: Pedal edema  History of Present Illness:    Antonio Oliver is a 63 y.o. male with past medical history of aortic stenosis post valve replacement and permanent pacemaker.  The patient mentions to me that he had spells of dizziness and lightheadedness and therefore his blood pressure medications namely beta-blocker and ACE inhibitor was stopped and subsequently he is done fine.  No chest pain orthopnea or PND.  He has pedal edema on and off necessitating him to take Lasix 4-5 times a week.  At the time of my evaluation, the patient is alert awake oriented and in no distress.  The patient does not have symptoms concerning for COVID-19 infection (fever, chills, cough, or new shortness of breath).    Past Medical History:  Diagnosis Date  . BPH (benign prostatic hyperplasia)   . Depression   . Diabetes mellitus   . Glaucoma   . Hypertension   . Presence of permanent cardiac pacemaker    Past Surgical History:  Procedure Laterality Date   . ANTERIOR CERVICAL DECOMP/DISCECTOMY FUSION N/A 11/17/2018   Procedure: CERVICAL THREE-CERVICAL FOUR, CERVICAL FOUR-CERVICAL FIVE, CERVICAL FIVE-CERVICAL SIX ANTERIOR CERVICAL DECOMPRESSION/DISCECTOMY FUSION;  Surgeon: Earnie Larsson, MD;  Location: [place];  Service: Neurosurgery;  Laterality: N/A;  . BACK SURGERY     x4  . FOOT NEUROMA SURGERY    . GASTRIC BYPASS    . HEMORRHOID SURGERY     over 30 years ago  . HERNIA REPAIR     LIH umb  . NASAL SINUS SURGERY     x2  . NECK SURGERY       Current Meds  Medication Sig  . aspirin 81 MG tablet Take 81 mg by mouth daily.  Marland Kitchen atorvastatin (LIPITOR) 20 MG tablet Take 20 mg by mouth daily at 6 PM.   . buPROPion (WELLBUTRIN SR) 200 MG 12 hr tablet Take 200 mg by mouth 2 (two) times daily.  . busPIRone (BUSPAR) 10 MG tablet Take 10 mg by mouth 2 (two) times daily.  . Calcium Citrate-Vitamin D (CALCIUM CITRATE + PO) Take 1 tablet by mouth daily.   . Cholecalciferol (VITAMIN D PO) Take 1,000 Units by mouth daily.   . clonazePAM (KLONOPIN) 1 MG tablet Take 1 mg by mouth 2 (two) times daily as needed for anxiety.   . DULoxetine (CYMBALTA) 60 MG capsule Take 60 mg by mouth 2 (two) times daily.   . fexofenadine (ALLEGRA) 180 MG tablet Take 180 mg by mouth daily.  . finasteride (PROSCAR) 5 MG tablet  Take 5 mg by mouth daily.  . fluticasone (FLONASE) 50 MCG/ACT nasal spray Place 2 sprays into the nose daily.  . folic acid (FOLVITE) 1 MG tablet Take 1 mg by mouth daily.  . furosemide (LASIX) 40 MG tablet Take 40 mg by mouth as needed.   Marland Kitchen levothyroxine (SYNTHROID) 125 MCG tablet Take 125 mcg by mouth daily.   . Magnesium 500 MG CAPS Take 500 mg by mouth 2 (two) times a day.   . metFORMIN (GLUCOPHAGE) 1000 MG tablet Take 1,000 mg by mouth 2 (two) times daily with a meal.  . mirtazapine (REMERON) 30 MG tablet Take 60 mg by mouth at bedtime.  . Multiple Vitamin (MULTIVITAMIN) capsule Take 1 capsule by mouth daily.  Marland Kitchen omeprazole (PRILOSEC) 20 MG capsule  Take 20 mg by mouth 2 (two) times daily before a meal.   . oxyCODONE-acetaminophen (PERCOCET) 10-325 MG tablet Take 1 tablet by mouth every 4 (four) hours as needed for pain.  . potassium chloride (KLOR-CON) 20 MEQ packet Take 20 mEq by mouth as needed.   . pramipexole (MIRAPEX) 1 MG tablet Take 1 mg by mouth 3 (three) times daily.  . silodosin (RAPAFLO) 8 MG CAPS capsule Take 8 mg by mouth daily with breakfast.  . SUMAtriptan (IMITREX) 6 MG/0.5ML SOLN injection Inject 6 mg into the skin every 2 (two) hours as needed for migraine or headache. May repeat in 2 hours if headache persists or recurs.  Marland Kitchen tiZANidine (ZANAFLEX) 4 MG capsule Take 1 capsule (4 mg total) by mouth 3 (three) times daily as needed for muscle spasms. (Patient taking differently: Take 4 mg by mouth 3 (three) times daily. )     Allergies:   Morphine   Social History   Tobacco Use  . Smoking status: Never Smoker  . Smokeless tobacco: Never Used  Substance Use Topics  . Alcohol use: Yes    Comment: occ.  . Drug use: No     Family Hx: The patient's family history includes Cancer in his mother; Heart disease in his father.  ROS:   Please see the history of present illness.    As above All other systems reviewed and are negative.   Prior CV studies:   The following studies were reviewed today:  None  Labs/Other Tests and Data Reviewed:    EKG:  No ECG reviewed.  Recent Labs: 11/14/2018: ALT 39; BUN 8; Creatinine, Ser 0.80; Hemoglobin 10.8; Platelets 253; Potassium 4.4; Sodium 136   Recent Lipid Panel Lab Results  Component Value Date/Time   CHOL 120 07/28/2011 04:30 PM   TRIG 119 07/28/2011 04:30 PM   HDL 53 07/28/2011 04:30 PM   CHOLHDL 2.3 07/28/2011 04:30 PM   LDLCALC 43 07/28/2011 04:30 PM    Wt Readings from Last 3 Encounters:  02/01/19 194 lb (88 kg)  12/18/18 204 lb (92.5 kg)  12/08/18 194 lb (88 kg)     Objective:    Vital Signs:  BP 118/77 (BP Location: Left Arm, Patient Position:  Sitting, Cuff Size: Normal)   Pulse 99   Ht 6\' 3"  (1.905 m)   Wt 194 lb (88 kg)   BMI 24.25 kg/m    VITAL SIGNS:  reviewed  ASSESSMENT & PLAN:    1. Dizziness and lightheadedness: I discussed my findings with the patient at extensive length.  I think it was from borderline low blood pressure and his beta-blocker and ACE inhibitor were discontinued and we will continue to monitor him. 2. I will  obtain copy of blood work from his primary care physician's office. 3. She is pacemaker reviewed by our colleagues in the past has been unremarkable earlier this year 4. Aortic stenosis post valve replacement: Echocardiogram will be done in the next few days and reviewed especially in view of the aforementioned situation 5. In view of lightheadedness and dizziness I will do a 14-day ZIO monitoring. 6. He will see me in follow-up appointment in 1 month or earlier if he has any concerns.  He knows to go to the nearest emergency room for any concerning symptoms.  COVID-19 Education: The signs and symptoms of COVID-19 were discussed with the patient and how to seek care for testing (follow up with PCP or arrange E-visit).  The importance of social distancing was discussed today.  Time:   Today, I have spent 15 minutes with the patient with telehealth technology discussing the above problems.     Medication Adjustments/Labs and Tests Ordered: Current medicines are reviewed at length with the patient today.  Concerns regarding medicines are outlined above.   Tests Ordered: No orders of the defined types were placed in this encounter.   Medication Changes: No orders of the defined types were placed in this encounter.   Disposition:  Follow up in 1 month(s)  Signed, Jenean Lindau, MD  02/01/2019 4:11 PM    [facility]

## 2019-02-14 ENCOUNTER — Other Ambulatory Visit: Payer: Self-pay

## 2019-02-14 ENCOUNTER — Encounter (HOSPITAL_COMMUNITY): Payer: Self-pay

## 2019-02-14 ENCOUNTER — Observation Stay (HOSPITAL_COMMUNITY)
Admission: EM | Admit: 2019-02-14 | Discharge: 2019-02-15 | Disposition: A | Discharge status: Home / Self Care | Payer: Medicare Other | Attending: Family Medicine | Admitting: Family Medicine

## 2019-02-14 ENCOUNTER — Telehealth: Payer: Self-pay | Admitting: Cardiology

## 2019-02-14 ENCOUNTER — Emergency Department (HOSPITAL_COMMUNITY): Payer: Medicare Other

## 2019-02-14 DIAGNOSIS — Z9884 Bariatric surgery status: Secondary | ICD-10-CM | POA: Insufficient documentation

## 2019-02-14 DIAGNOSIS — N400 Enlarged prostate without lower urinary tract symptoms: Secondary | ICD-10-CM | POA: Diagnosis not present

## 2019-02-14 DIAGNOSIS — E782 Mixed hyperlipidemia: Secondary | ICD-10-CM | POA: Insufficient documentation

## 2019-02-14 DIAGNOSIS — I2510 Atherosclerotic heart disease of native coronary artery without angina pectoris: Secondary | ICD-10-CM | POA: Insufficient documentation

## 2019-02-14 DIAGNOSIS — D509 Iron deficiency anemia, unspecified: Secondary | ICD-10-CM | POA: Insufficient documentation

## 2019-02-14 DIAGNOSIS — I442 Atrioventricular block, complete: Secondary | ICD-10-CM | POA: Diagnosis not present

## 2019-02-14 DIAGNOSIS — H409 Unspecified glaucoma: Secondary | ICD-10-CM | POA: Diagnosis not present

## 2019-02-14 DIAGNOSIS — Z950 Presence of cardiac pacemaker: Secondary | ICD-10-CM | POA: Diagnosis not present

## 2019-02-14 DIAGNOSIS — F329 Major depressive disorder, single episode, unspecified: Secondary | ICD-10-CM | POA: Insufficient documentation

## 2019-02-14 DIAGNOSIS — Z7982 Long term (current) use of aspirin: Secondary | ICD-10-CM | POA: Insufficient documentation

## 2019-02-14 DIAGNOSIS — I158 Other secondary hypertension: Secondary | ICD-10-CM

## 2019-02-14 DIAGNOSIS — I77810 Thoracic aortic ectasia: Secondary | ICD-10-CM | POA: Insufficient documentation

## 2019-02-14 DIAGNOSIS — E039 Hypothyroidism, unspecified: Secondary | ICD-10-CM | POA: Diagnosis not present

## 2019-02-14 DIAGNOSIS — I10 Essential (primary) hypertension: Secondary | ICD-10-CM | POA: Insufficient documentation

## 2019-02-14 DIAGNOSIS — Z79891 Long term (current) use of opiate analgesic: Secondary | ICD-10-CM | POA: Insufficient documentation

## 2019-02-14 DIAGNOSIS — F419 Anxiety disorder, unspecified: Secondary | ICD-10-CM | POA: Insufficient documentation

## 2019-02-14 DIAGNOSIS — E119 Type 2 diabetes mellitus without complications: Secondary | ICD-10-CM | POA: Diagnosis not present

## 2019-02-14 DIAGNOSIS — R2689 Other abnormalities of gait and mobility: Secondary | ICD-10-CM | POA: Diagnosis not present

## 2019-02-14 DIAGNOSIS — K219 Gastro-esophageal reflux disease without esophagitis: Secondary | ICD-10-CM | POA: Diagnosis not present

## 2019-02-14 DIAGNOSIS — R609 Edema, unspecified: Secondary | ICD-10-CM | POA: Diagnosis not present

## 2019-02-14 DIAGNOSIS — Z8249 Family history of ischemic heart disease and other diseases of the circulatory system: Secondary | ICD-10-CM | POA: Insufficient documentation

## 2019-02-14 DIAGNOSIS — Z87891 Personal history of nicotine dependence: Secondary | ICD-10-CM | POA: Diagnosis not present

## 2019-02-14 DIAGNOSIS — G43909 Migraine, unspecified, not intractable, without status migrainosus: Secondary | ICD-10-CM | POA: Insufficient documentation

## 2019-02-14 DIAGNOSIS — Z7951 Long term (current) use of inhaled steroids: Secondary | ICD-10-CM | POA: Insufficient documentation

## 2019-02-14 DIAGNOSIS — Q231 Congenital insufficiency of aortic valve: Secondary | ICD-10-CM | POA: Diagnosis not present

## 2019-02-14 DIAGNOSIS — I700 Atherosclerosis of aorta: Secondary | ICD-10-CM | POA: Diagnosis not present

## 2019-02-14 DIAGNOSIS — R55 Syncope and collapse: Secondary | ICD-10-CM | POA: Diagnosis present

## 2019-02-14 DIAGNOSIS — G959 Disease of spinal cord, unspecified: Secondary | ICD-10-CM | POA: Insufficient documentation

## 2019-02-14 DIAGNOSIS — Z79899 Other long term (current) drug therapy: Secondary | ICD-10-CM | POA: Insufficient documentation

## 2019-02-14 DIAGNOSIS — G8929 Other chronic pain: Secondary | ICD-10-CM | POA: Insufficient documentation

## 2019-02-14 DIAGNOSIS — Z981 Arthrodesis status: Secondary | ICD-10-CM | POA: Diagnosis not present

## 2019-02-14 DIAGNOSIS — I959 Hypotension, unspecified: Principal | ICD-10-CM | POA: Insufficient documentation

## 2019-02-14 DIAGNOSIS — Z952 Presence of prosthetic heart valve: Secondary | ICD-10-CM | POA: Insufficient documentation

## 2019-02-14 DIAGNOSIS — Z1159 Encounter for screening for other viral diseases: Secondary | ICD-10-CM | POA: Diagnosis not present

## 2019-02-14 DIAGNOSIS — E559 Vitamin D deficiency, unspecified: Secondary | ICD-10-CM | POA: Diagnosis not present

## 2019-02-14 DIAGNOSIS — Z66 Do not resuscitate: Secondary | ICD-10-CM | POA: Insufficient documentation

## 2019-02-14 HISTORY — DX: Syncope and collapse: R55

## 2019-02-14 LAB — URINALYSIS, ROUTINE W REFLEX MICROSCOPIC
Bilirubin Urine: NEGATIVE
Glucose, UA: 50 mg/dL — AB
Hgb urine dipstick: NEGATIVE
Ketones, ur: NEGATIVE mg/dL
Leukocytes,Ua: NEGATIVE
Nitrite: NEGATIVE
Protein, ur: NEGATIVE mg/dL
Specific Gravity, Urine: 1.019 (ref 1.005–1.030)
pH: 5 (ref 5.0–8.0)

## 2019-02-14 LAB — CBC
HCT: 34.4 % — ABNORMAL LOW (ref 39.0–52.0)
Hemoglobin: 10 g/dL — ABNORMAL LOW (ref 13.0–17.0)
MCH: 23.3 pg — ABNORMAL LOW (ref 26.0–34.0)
MCHC: 29.1 g/dL — ABNORMAL LOW (ref 30.0–36.0)
MCV: 80.2 fL (ref 80.0–100.0)
Platelets: 245 10*3/uL (ref 150–400)
RBC: 4.29 MIL/uL (ref 4.22–5.81)
RDW: 14.9 % (ref 11.5–15.5)
WBC: 6.2 10*3/uL (ref 4.0–10.5)
nRBC: 0 % (ref 0.0–0.2)

## 2019-02-14 LAB — BASIC METABOLIC PANEL
Anion gap: 9 (ref 5–15)
BUN: 12 mg/dL (ref 8–23)
CO2: 23 mmol/L (ref 22–32)
Calcium: 9.1 mg/dL (ref 8.9–10.3)
Chloride: 106 mmol/L (ref 98–111)
Creatinine, Ser: 1 mg/dL (ref 0.61–1.24)
GFR calc Af Amer: >60 mL/min (ref >60)
GFR calc non Af Amer: >60 mL/min (ref >60)
Glucose, Bld: 250 mg/dL — ABNORMAL HIGH (ref 70–99)
Potassium: 4.1 mmol/L (ref 3.5–5.1)
Sodium: 138 mmol/L (ref 135–145)

## 2019-02-14 LAB — LACTIC ACID, PLASMA: Lactic Acid, Venous: 1.7 mmol/L (ref 0.5–1.9)

## 2019-02-14 LAB — HEPATIC FUNCTION PANEL
ALT: 25 U/L (ref 0–44)
AST: 22 U/L (ref 15–41)
Albumin: 3.5 g/dL (ref 3.5–5.0)
Alkaline Phosphatase: 103 U/L (ref 38–126)
Bilirubin, Direct: <0.1 mg/dL (ref 0.0–0.2)
Total Bilirubin: 0.4 mg/dL (ref 0.3–1.2)
Total Protein: 5.7 g/dL — ABNORMAL LOW (ref 6.5–8.1)

## 2019-02-14 LAB — BRAIN NATRIURETIC PEPTIDE: B Natriuretic Peptide: 43.7 pg/mL (ref 0.0–100.0)

## 2019-02-14 LAB — TROPONIN I: Troponin I: <0.03 ng/mL (ref <0.03)

## 2019-02-14 LAB — D-DIMER, QUANTITATIVE: D-Dimer, Quant: 0.94 ug/mL-FEU — ABNORMAL HIGH (ref 0.00–0.50)

## 2019-02-14 MED ORDER — IOHEXOL 350 MG/ML SOLN
80.0000 mL | Freq: Once | INTRAVENOUS | Status: AC | PRN
  Administered 2019-02-14: 80 mL via INTRAVENOUS

## 2019-02-14 MED ORDER — SODIUM CHLORIDE 0.9% FLUSH
3.0000 mL | Freq: Once | INTRAVENOUS | Status: AC
  Administered 2019-02-15: 3 mL via INTRAVENOUS

## 2019-02-14 NOTE — Consult Note (Signed)
Cardiology Consultation   Patient ID: Antonio Oliver; [id number]; Jul 19, 1956   Admit date: 02/14/2019 Date of Consult: 02/14/2019  Referring Provider:  Nanda Quinton, MD  Primary Care Provider: Cher Nakai, MD Cardiologist: Revankar Electrophysiologist:  Curt Bears  Reason for Consultation: labile BP  History of Present Illness: Antonio Oliver is a 63 y.o. male who is being seen today for the evaluation of labile BP, hypotension and LE edema at the request of the [facility] ED.   Pt has h/o bicuspid AoV s/p AVR w/ bioprosthesis; he also has h/o heart block and is s/p PPM placement. He has h/o LHC in 2018 showing normal cors, and last TTE in 2018 showed EF 65%. Review of prior cardiology notes in [place] show that pt has a h/o of issues w/ LE edema as well as orthostatic hypotension.  This is a direct quote from his prior cardiologist in [place] at a visit dated 12-26-17: "I do not believe his edema is related to any kind of heart failure symptoms. It is most likely due to venous insufficiency given the asymmetric quality of it. His orthostasis is also due to polypharmacy and his verapamil. He was placed on verapamil for "heart spasms" 30 years ago." In the note from Dec of 2019, it is said that pt's edema improved after using compression stockings and diuretics.  More recent telephone notes in the chart indicate pt contacted cardiology on 5-28 regarding recurrent spells of dizziness/lightheadedness. It appears pt was advised to stop his ACEi and BB.  Pt was apparently seen in [facility] ED and given IVF. He apparently called in to cardiology b/c he was not doing better, so he was advised to come to the hospital for further evaluation. Pt's wife says they have noticed his BP going up and down for the past several weeks. PCP will stop BP meds, and his BP goes up; they restart them again and BP drops very low. He has also had worsening of his chronic LE edema. She also notes his resting HR has  been elevated, often in the low 100s.  He has not had any significant worsening of rest or exertional chest discomfort, SOB/DOE, palpitations, or other associated sx.   Past Medical History:  Diagnosis Date   BPH (benign prostatic hyperplasia)    Depression    Diabetes mellitus    Glaucoma    Hypertension    Presence of permanent cardiac pacemaker     Past Surgical History:  Procedure Laterality Date   ANTERIOR CERVICAL DECOMP/DISCECTOMY FUSION N/A 11/17/2018   Procedure: CERVICAL THREE-CERVICAL FOUR, CERVICAL FOUR-CERVICAL FIVE, CERVICAL FIVE-CERVICAL SIX ANTERIOR CERVICAL DECOMPRESSION/DISCECTOMY FUSION;  Surgeon: Earnie Larsson, MD;  Location: [place];  Service: Neurosurgery;  Laterality: N/A;   BACK SURGERY     x4   FOOT NEUROMA SURGERY     GASTRIC BYPASS     HEMORRHOID SURGERY     over 30 years ago   [place] umb   NASAL SINUS SURGERY     x2   NECK SURGERY        Current Medications:  sodium chloride flush  3 mL Intravenous Once    Infused Medications:   PRN Medications:    Allergies:    Allergies  Allergen Reactions   Morphine Itching    Can take with benadry, facial and nose itching     Social History:   The patient  reports that he has never smoked. He has never used smokeless tobacco.  He reports current alcohol use. He reports that he does not use drugs.    Family History:   The patient's family history includes Cancer in his mother; Heart disease in his father.   ROS:  Please see the history of present illness.  All other ROS reviewed and negative.     Vital Signs: Blood pressure 140/85, pulse 91, temperature 98.2 F (36.8 C), temperature source Oral, resp. rate 19, SpO2 97 %.   PHYSICAL EXAM: General:  Well nourished, well developed, in no acute distress HEENT: normal Lymph: no adenopathy Neck: no JVD Endocrine:  No thryomegaly Vascular: No carotid bruits; DP pulses 2+ bilaterally  Cardiac:  normal S1, S2; RRR; 2/6  sys murmur loudest ULSB Lungs:  clear to auscultation bilaterally, no wheezing, rhonchi or rales  Abd: soft, nontender, no hepatomegaly  Ext: 1+ bilateral pitting edema; ext warm Musculoskeletal:  No deformities, BUE and BLE strength normal and equal Skin: warm and dry  Neuro:  CNs 2-12 intact, no focal abnormalities noted Psych:  Normal affect   EKG:  ST, HR 104. Lateral TWI. No sig change from prior  Labs: Recent Labs    02/14/19 2140  TROPONINI <0.03   No results for input(s): TROPIPOC in the last 72 hours.  Lab Results  Component Value Date   WBC 6.2 02/14/2019   HGB 10.0 (L) 02/14/2019   HCT 34.4 (L) 02/14/2019   MCV 80.2 02/14/2019   PLT 245 02/14/2019   Recent Labs  Lab 02/14/19 2028 02/14/19 2140  NA 138  --   K 4.1  --   CL 106  --   CO2 23  --   BUN 12  --   CREATININE 1.00  --   CALCIUM 9.1  --   PROT  --  5.7*  BILITOT  --  0.4  ALKPHOS  --  103  ALT  --  25  AST  --  22  GLUCOSE 250*  --    Lab Results  Component Value Date   CHOL 120 07/28/2011   HDL 53 07/28/2011   LDLCALC 43 07/28/2011   TRIG 119 07/28/2011   Lab Results  Component Value Date   DDIMER 0.94 (H) 02/14/2019    Radiology/Studies:  Ct Angio Chest Pe W And/or Wo Contrast  Result Date: 02/14/2019 CLINICAL DATA:  Syncopal episodes.  Hypotension. EXAM: CT ANGIOGRAPHY CHEST WITH CONTRAST TECHNIQUE: Multidetector CT imaging of the chest was performed using the standard protocol during bolus administration of intravenous contrast. Multiplanar CT image reconstructions and MIPs were obtained to evaluate the vascular anatomy. CONTRAST:  25mL OMNIPAQUE IOHEXOL 350 MG/ML SOLN COMPARISON:  None. FINDINGS: Cardiovascular: The ascending thoracic aorta is ectatic measuring approximately 4.4 cm in diameter. Heart size is mildly enlarged. The patient appears to be status post prior aortic valve replacement. An ICD is noted. Mild aortic calcifications are noted Mediastinum/Nodes: No enlarged  mediastinal, hilar, or axillary lymph nodes. Thyroid gland, trachea, and esophagus demonstrate no significant findings. Lungs/Pleura: There are few stable peripheral nodules in the right middle lobe. The lungs are otherwise clear. There is no pneumothorax. No large pleural effusion. The trachea is unremarkable. Upper Abdomen: The patient appears to be status post gastric bypass. There is a small fat containing lesion adjacent to the posterior right hepatic lobe, of doubtful clinical significance. Musculoskeletal: No chest wall abnormality. No acute or significant osseous findings. Review of the MIP images confirms the above findings. IMPRESSION: 1. No PE. 2. The lungs are clear. 3. Ectatic  ascending thoracic aorta measuring approximately 4.4 cm in diameter. Follow-up is recommended. Recommend annual imaging followup by CTA or MRA. This recommendation follows 2010 ACCF/AHA/AATS/ACR/ASA/SCA/SCAI/SIR/STS/SVM Guidelines for the Diagnosis and Management of Patients with Thoracic Aortic Disease. Circulation. 2010; 121: L875-I433. Aortic aneurysm NOS (ICD10-I71.9) Aortic Atherosclerosis (ICD10-I70.0). Electronically Signed   By: Constance Holster M.D.   On: 02/14/2019 23:09   Dg Chest Portable 1 View  Result Date: 02/14/2019 CLINICAL DATA:  Weakness EXAM: PORTABLE CHEST 1 VIEW COMPARISON:  02/12/2019, 09/22/2018 FINDINGS: Post sternotomy changes. Left-sided pacing device as before. Valvular prosthesis. No acute airspace disease or effusion. Stable cardiomediastinal silhouette. No pneumothorax. IMPRESSION: No active disease. Electronically Signed   By: Donavan Foil M.D.   On: 02/14/2019 21:50   TTE 03-15-17 Technical quality of this study precludes accurate assessment for wall  motion abnormalities.  There is moderate concentric left ventricular hypertrophy.  Left ventricular size is normal.  Overall left ventricular systolic function is normal with an EF between  60-65 %.  Left pleural effusion.  LHC  02-03-17 AO: 117/75 Gradient AoV: Not done Rhythm: Sinus rhythm  LV Not done  Left Main Large, trifurcates into LAD, ramus, circumflex. No significant  angiographic disease.  LCx Codominant vessel. Gives off a medium-size OM1, bifurcating OM 2, and terminates as a L PDA.  No significant angiographic disease.  LAD Transapical vessel. D1 is large, D2 is medium-size. No significant  angiographic disease.  Ramus Medium-sized vessel. No significant angiographic disease.  RCA Small to medium size, codominant vessel. Gives off a small RPDA and  terminates as a small RPL. No significant angiographic disease.   ASSESSMENT AND PLAN:  1. Labile BP/dizzy spells: all BP meds have been stopped. Pt has no h/o HF; last EF was 65%. Would repeat TTE to reassess LVEF and check on bioprosthetic AV function. Pt had normal cors on LHC in 2018. Would check thyroid panel given h/o thyroid dysfunction in the past and look for other possible non-cardiac causes for pt's current complaints. Agree w/ testing for coronarvirus as well.  2. Edema: longstanding and felt to be related to venous insufficiency and other multifactorial causes. Would repeat TTE as above to check LVEF and prosthetic AV function.  3. DM2/dyslipidemia/HTN: mgmt as per primary medical team  Thank you for the opportunity to participate in the care of this patient.  For questions or updates, please contact [place] Please consult [email or web address] for contact info under   Signed, Rudean Curt, MD, [facility]  02/14/2019 11:34 PM

## 2019-02-14 NOTE — ED Provider Notes (Signed)
Emergency Department Provider Note   I have reviewed the triage vital signs and the nursing notes.   HISTORY  Chief Complaint Loss of Consciousness and Dizziness   HPI Antonio Oliver is a 63 y.o. male with PMH of DM, HTN, mitral valve repair with resulting bradycardia requiring pacemaker presents to the ED with weakness, fluid retention, and labile BP. Patient had two episodes of syncope 3 days prior. He went to the [facility] ED and had IVF.  Wife states that since returning home he seemed generally weak and worn out.  He has not had additional syncope.  Is describing some mild dyspnea and swelling in the legs.  His Lasix, metoprolol, lisinopril were stopped the week prior with episodes of hypotension by his cardiologist, Dr. Geraldo Pitter.  Patient's wife states that they called the cardiology office today and he was referred to [facility] for admission.    Past Medical History:  Diagnosis Date   BPH (benign prostatic hyperplasia)    Depression    Diabetes mellitus    Glaucoma    Hypertension    Presence of permanent cardiac pacemaker     Patient Active Problem List   Diagnosis Date Noted   Syncope 02/14/2019   Bradycardia 02/01/2019   Kidney stone 02/01/2019   Nonrheumatic aortic valve stenosis 02/01/2019   Surgery, elective    Weakness    Diabetes mellitus type 2 in nonobese (HCC)    Leukocytosis    Nondisplaced fracture of medial cuneiform of left foot, initial encounter for closed fracture 11/16/2018   Cervical myelopathy ([place]) 11/14/2018   Diabetes ([place]) 10/30/2018   Hyperlipidemia 10/30/2018   Depression 10/30/2018   Anxiety 10/30/2018   Vitamin D deficiency 10/30/2018   Osteoarthritis 10/30/2018   GERD (gastroesophageal reflux disease) 10/30/2018   Fatigue 10/30/2018   Hypothyroidism 10/30/2018   Essential hypertension 10/30/2018   Mixed dyslipidemia 10/30/2018   Diabetes mellitus due to underlying condition with unspecified  complications ([place]) 01/75/1025   Neoplasm of uncertain behavior of skin 07/28/2018   Hemangioma of skin and subcutaneous tissue 07/07/2017   Lentigo 07/07/2017   Multiple actinic keratoses 07/07/2017   Multiple benign melanocytic nevi 07/07/2017   Other seborrheic keratosis 07/07/2017   Complete heart block ([place]) 05/04/2017   S/P AVR (aortic valve replacement) 03/10/2017   Bicuspid aortic valve 12/22/2016   Benign prostatic hyperplasia without lower urinary tract symptoms 11/17/2016   Chronic inflammatory arthritis 11/17/2016   Chronic bilateral low back pain 11/17/2016   Heart murmur 11/17/2016   History of kidney stones 11/17/2016   Migraine 11/17/2016   Severe single current episode of major depressive disorder, without psychotic features ([place]) 11/17/2016   Compartment syndrome ([place]) 06/20/2013   Enthesopathy of ankle and tarsus 04/04/2013   FHx: migraine headaches 04/04/2013   Neck pain 04/04/2013   Lesion of plantar nerve 04/04/2013   Personal history of tobacco use, presenting hazards to health 04/04/2013   Status post bariatric surgery 04/04/2013   Strain of rotator cuff 04/04/2013   Chronic back pain 04/03/2013   Glaucoma 04/03/2013   Morbid obesity ([place]) 10/27/2011    Past Surgical History:  Procedure Laterality Date   ANTERIOR CERVICAL DECOMP/DISCECTOMY FUSION N/A 11/17/2018   Procedure: CERVICAL THREE-CERVICAL FOUR, CERVICAL FOUR-CERVICAL FIVE, CERVICAL FIVE-CERVICAL SIX ANTERIOR CERVICAL DECOMPRESSION/DISCECTOMY FUSION;  Surgeon: Earnie Larsson, MD;  Location: [place];  Service: Neurosurgery;  Laterality: N/A;   BACK SURGERY     x4   FOOT NEUROMA SURGERY     GASTRIC BYPASS  HEMORRHOID SURGERY     over 30 years ago   HERNIA REPAIR     LIH umb   NASAL SINUS SURGERY     x2   NECK SURGERY      Allergies Morphine  Family History  Problem Relation Age of Onset   Cancer Mother        breast   Heart disease Father      Social History Social History   Tobacco Use   Smoking status: Never Smoker   Smokeless tobacco: Never Used  Substance Use Topics   Alcohol use: Yes    Comment: occ.   Drug use: No    Review of Systems  Constitutional: No fever/chills. Positive weakness.  Eyes: No visual changes. ENT: No sore throat. Cardiovascular: Denies chest pain. Positive LE edema.  Respiratory: Denies shortness of breath. Gastrointestinal: No abdominal pain.  No nausea, no vomiting.  No diarrhea.  No constipation. Genitourinary: Negative for dysuria. Musculoskeletal: Negative for back pain. Skin: Negative for rash. Neurological: Negative for headaches, focal weakness or numbness.  10-point ROS otherwise negative.  ____________________________________________   PHYSICAL EXAM:  VITAL SIGNS: ED Triage Vitals  Enc Vitals Group     BP 02/14/19 2021 (!) 155/86     Pulse Rate 02/14/19 2021 (!) 102     Resp 02/14/19 2021 16     Temp 02/14/19 2021 98.2 F (36.8 C)     Temp Source 02/14/19 2021 Oral     SpO2 02/14/19 2021 100 %     Pain Score 02/14/19 2023 0   Constitutional: Alert and oriented. Well appearing and in no acute distress. Eyes: Conjunctivae are normal.  Head: Atraumatic. Nose: No congestion/rhinnorhea. Mouth/Throat: Mucous membranes are moist.  Neck: No stridor.  Cardiovascular: Tachycardia. Good peripheral circulation. Grossly normal heart sounds.   Respiratory: Normal respiratory effort.  No retractions. Lungs CTAB. Gastrointestinal: Soft and nontender. No distention.  Musculoskeletal: No lower extremity tenderness with 2+ pitting edema. No gross deformities of extremities. Neurologic:  Normal speech and language. No gross focal neurologic deficits are appreciated.  Skin:  Skin is warm, dry and intact. No rash noted.   ____________________________________________   LABS (all labs ordered are listed, but only abnormal results are displayed)  Labs Reviewed  BASIC  METABOLIC PANEL - Abnormal; Notable for the following components:      Result Value   Glucose, Bld 250 (*)    All other components within normal limits  CBC - Abnormal; Notable for the following components:   Hemoglobin 10.0 (*)    HCT 34.4 (*)    MCH 23.3 (*)    MCHC 29.1 (*)    All other components within normal limits  URINALYSIS, ROUTINE W REFLEX MICROSCOPIC - Abnormal; Notable for the following components:   Glucose, UA 50 (*)    All other components within normal limits  HEPATIC FUNCTION PANEL - Abnormal; Notable for the following components:   Total Protein 5.7 (*)    All other components within normal limits  D-DIMER, QUANTITATIVE (NOT AT [facility]) - Abnormal; Notable for the following components:   D-Dimer, Quant 0.94 (*)    All other components within normal limits  BASIC METABOLIC PANEL - Abnormal; Notable for the following components:   Glucose, Bld 109 (*)    All other components within normal limits  CBC - Abnormal; Notable for the following components:   RBC 4.04 (*)    Hemoglobin 9.5 (*)    HCT 31.4 (*)    MCV 77.7 (*)  MCH 23.5 (*)    All other components within normal limits  HEMOGLOBIN A1C - Abnormal; Notable for the following components:   Hgb A1c MFr Bld 6.2 (*)    All other components within normal limits  IRON AND TIBC - Abnormal; Notable for the following components:   Iron 27 (*)    TIBC 452 (*)    Saturation Ratios 6 (*)    All other components within normal limits  FERRITIN - Abnormal; Notable for the following components:   Ferritin 16 (*)    All other components within normal limits  GLUCOSE, CAPILLARY - Abnormal; Notable for the following components:   Glucose-Capillary 193 (*)    All other components within normal limits  GLUCOSE, CAPILLARY - Abnormal; Notable for the following components:   Glucose-Capillary 101 (*)    All other components within normal limits  SARS CORONAVIRUS 2  TROPONIN I  BRAIN NATRIURETIC PEPTIDE  LACTIC ACID, PLASMA   TSH  TROPONIN I  TROPONIN I   ____________________________________________  EKG   EKG Interpretation  Date/Time:  Wednesday February 14 2019 20:07:57 EDT Ventricular Rate:  104 PR Interval:  200 QRS Duration: 98 QT Interval:  350 QTC Calculation: 460 R Axis:   -12 Text Interpretation:  Sinus tachycardia ST & T wave abnormality, consider lateral ischemia Abnormal ECG No STEMI. Lateral changes similar to prior.  Confirmed by Nanda Quinton [phone]) on 02/14/2019 8:38:51 PM Also confirmed by Nanda Quinton [phone]), editor Philomena Doheny [phone])  on 02/15/2019 7:24:18 AM       ____________________________________________  RADIOLOGY  Ct Angio Chest Pe W And/or Wo Contrast  Result Date: 02/14/2019 CLINICAL DATA:  Syncopal episodes.  Hypotension. EXAM: CT ANGIOGRAPHY CHEST WITH CONTRAST TECHNIQUE: Multidetector CT imaging of the chest was performed using the standard protocol during bolus administration of intravenous contrast. Multiplanar CT image reconstructions and MIPs were obtained to evaluate the vascular anatomy. CONTRAST:  3mL OMNIPAQUE IOHEXOL 350 MG/ML SOLN COMPARISON:  None. FINDINGS: Cardiovascular: The ascending thoracic aorta is ectatic measuring approximately 4.4 cm in diameter. Heart size is mildly enlarged. The patient appears to be status post prior aortic valve replacement. An ICD is noted. Mild aortic calcifications are noted Mediastinum/Nodes: No enlarged mediastinal, hilar, or axillary lymph nodes. Thyroid gland, trachea, and esophagus demonstrate no significant findings. Lungs/Pleura: There are few stable peripheral nodules in the right middle lobe. The lungs are otherwise clear. There is no pneumothorax. No large pleural effusion. The trachea is unremarkable. Upper Abdomen: The patient appears to be status post gastric bypass. There is a small fat containing lesion adjacent to the posterior right hepatic lobe, of doubtful clinical significance. Musculoskeletal: No chest wall  abnormality. No acute or significant osseous findings. Review of the MIP images confirms the above findings. IMPRESSION: 1. No PE. 2. The lungs are clear. 3. Ectatic ascending thoracic aorta measuring approximately 4.4 cm in diameter. Follow-up is recommended. Recommend annual imaging followup by CTA or MRA. This recommendation follows 2010 ACCF/AHA/AATS/ACR/ASA/SCA/SCAI/SIR/STS/SVM Guidelines for the Diagnosis and Management of Patients with Thoracic Aortic Disease. Circulation. 2010; 121: L893-T342. Aortic aneurysm NOS (ICD10-I71.9) Aortic Atherosclerosis (ICD10-I70.0). Electronically Signed   By: Constance Holster M.D.   On: 02/14/2019 23:09   Dg Chest Portable 1 View  Result Date: 02/14/2019 CLINICAL DATA:  Weakness EXAM: PORTABLE CHEST 1 VIEW COMPARISON:  02/12/2019, 09/22/2018 FINDINGS: Post sternotomy changes. Left-sided pacing device as before. Valvular prosthesis. No acute airspace disease or effusion. Stable cardiomediastinal silhouette. No pneumothorax. IMPRESSION: No active disease. Electronically Signed  By: Donavan Foil M.D.   On: 02/14/2019 21:50    ____________________________________________   PROCEDURES  Procedure(s) performed:   Procedures  None  ____________________________________________   INITIAL IMPRESSION / ASSESSMENT AND PLAN / ED COURSE  Pertinent labs & imaging results that were available during my care of the patient were reviewed by me and considered in my medical decision making (see chart for details).   Patient presents to the emergency department for evaluation of generalized weakness, labile blood pressure, lower extremity edema.  He was sent in by his cardiologist for evaluation and likely admission for management of the symptoms.  He is in a walking boot after recent left leg fracture.  Considered PE.  D-dimer slightly elevated and sent for CT of the chest which shows thoracic aneurysm measuring 4.4 cm with no acute changes.  No PE.  Troponin negative.   No hypotensive episodes here but wife describing multiple episodes over the last several days, 2 of which leading to syncope.   Spoke with [facility]ne who interrogated the pacemaker remotely.  No acute episodes.  One episode of SVT in April but nothing since.  Nothing to explain symptoms.   Spoke with cardiology fellow on-call who recommend medicine admit with echo to assess aortic valve function in the morning.  They can consult if needed.   Discussed patient's case with Medicine Service to request admission. Patient and family (if present) updated with plan. Care transferred to Medicine service.  I reviewed all nursing notes, vitals, pertinent old records, EKGs, labs, imaging (as available).  ____________________________________________  FINAL CLINICAL IMPRESSION(S) / ED DIAGNOSES  Final diagnoses:  Hypotension, unspecified hypotension type     MEDICATIONS GIVEN DURING THIS VISIT:  Medications  aspirin chewable tablet 81 mg (81 mg Oral Given 02/15/19 0935)  atorvastatin (LIPITOR) tablet 20 mg (has no administration in time range)  buPROPion (WELLBUTRIN SR) 12 hr tablet 200 mg (200 mg Oral Given 02/15/19 0952)  busPIRone (BUSPAR) tablet 20 mg (20 mg Oral Given 02/15/19 0934)  calcium citrate (CALCITRATE - dosed in mg elemental calcium) tablet 200 mg of elemental calcium (200 mg of elemental calcium Oral Given 02/15/19 0933)  DULoxetine (CYMBALTA) DR capsule 60 mg (60 mg Oral Given 02/15/19 0934)  loratadine (CLARITIN) tablet 10 mg (10 mg Oral Given 02/15/19 0952)  fluticasone (FLONASE) 50 MCG/ACT nasal spray 2 spray (2 sprays Each Nare Given 02/15/19 0936)  magnesium oxide (MAG-OX) tablet 400 mg (400 mg Oral Given 02/15/19 0934)  pantoprazole (PROTONIX) EC tablet 80 mg (80 mg Oral Given 02/15/19 0934)  Brexpiprazole TABS 2 mg (2 mg Oral Given 02/15/19 0935)  levothyroxine (SYNTHROID) tablet 125 mcg (125 mcg Oral Given 12/29/93 6387)  folic acid (FOLVITE) tablet 1 mg (1 mg Oral Given 02/15/19  0934)  pramipexole (MIRAPEX) tablet 3 mg (has no administration in time range)  multivitamin with minerals tablet 1 tablet (1 tablet Oral Given 02/15/19 0934)  enoxaparin (LOVENOX) injection 40 mg (has no administration in time range)  acetaminophen (TYLENOL) tablet 650 mg (650 mg Oral Given 02/15/19 0934)    Or  acetaminophen (TYLENOL) suppository 650 mg ( Rectal See Alternative 02/15/19 0934)  polyethylene glycol (MIRALAX / GLYCOLAX) packet 17 g (has no administration in time range)  ondansetron (ZOFRAN) tablet 4 mg (has no administration in time range)    Or  ondansetron (ZOFRAN) injection 4 mg (has no administration in time range)  oxyCODONE-acetaminophen (PERCOCET/ROXICET) 5-325 MG per tablet 1 tablet (has no administration in time range)  And  oxyCODONE (Oxy IR/ROXICODONE) immediate release tablet 5 mg (has no administration in time range)  mirtazapine (REMERON) tablet 60 mg (has no administration in time range)  insulin aspart (novoLOG) injection 0-9 Units (2 Units Subcutaneous Given 02/15/19 0935)  perflutren lipid microspheres (DEFINITY) IV suspension (has no administration in time range)  finasteride (PROSCAR) tablet 5 mg (has no administration in time range)  sodium chloride flush (NS) 0.9 % injection 3 mL (3 mLs Intravenous Given 02/15/19 0254)  iohexol (OMNIPAQUE) 350 MG/ML injection 80 mL (80 mLs Intravenous Contrast Given 02/14/19 2248)  PERFLUTREN LIPID MICROSPHERE injection SUSP (2 mLs Intravenous Given 02/15/19 0901)    Note:  This document was prepared using Dragon voice recognition software and may include unintentional dictation errors.  Nanda Quinton, MD Emergency Medicine    Wentworth Edelen, [facility], MD 02/15/19 1145

## 2019-02-14 NOTE — Telephone Encounter (Signed)
Patient is not doing any better.  Was seen in [facility] ED.  Wants to know if RRR will see him or call him.  Wife feels like meds or something needs to be changed because he is not getting better.  Please call to discuss [phone]

## 2019-02-14 NOTE — Telephone Encounter (Signed)
Pt having unexplained blood pressure fluctations as low as 77/40. Patients wife is concerned because his peripherial edema is increased. He is taking his lasix as prescribed. He has not had an urinary issues until today, wife feels his urination has decreased. RN referred to Dr. Docia Furl and patient advised to go to [facility] or [facility] to be evaluated due to hemodynamic instability.

## 2019-02-14 NOTE — ED Triage Notes (Signed)
Pt reports multiple syncopal episodes on Monday with hypotension. Pt seen at [facility], given fluids and then discharged to follow up with cardiology. Wife called cardiology today and was told to come here for further evaluation. Pt has had intermittent hypotension. Pt looks pale. Has a [place] ICD. Pt alert and oriented at this time. No pain

## 2019-02-14 NOTE — ED Notes (Signed)
PT is trying to void at this time to provide urine specimen.

## 2019-02-14 NOTE — ED Notes (Signed)
Patient transported to CT 

## 2019-02-15 ENCOUNTER — Observation Stay (HOSPITAL_BASED_OUTPATIENT_CLINIC_OR_DEPARTMENT_OTHER): Payer: Medicare Other

## 2019-02-15 ENCOUNTER — Other Ambulatory Visit: Payer: Self-pay

## 2019-02-15 ENCOUNTER — Encounter (HOSPITAL_COMMUNITY): Payer: Self-pay

## 2019-02-15 DIAGNOSIS — R55 Syncope and collapse: Secondary | ICD-10-CM | POA: Diagnosis not present

## 2019-02-15 DIAGNOSIS — I951 Orthostatic hypotension: Secondary | ICD-10-CM | POA: Diagnosis not present

## 2019-02-15 DIAGNOSIS — E119 Type 2 diabetes mellitus without complications: Secondary | ICD-10-CM | POA: Diagnosis not present

## 2019-02-15 DIAGNOSIS — Z952 Presence of prosthetic heart valve: Secondary | ICD-10-CM

## 2019-02-15 DIAGNOSIS — Z79899 Other long term (current) drug therapy: Secondary | ICD-10-CM | POA: Diagnosis not present

## 2019-02-15 DIAGNOSIS — R600 Localized edema: Secondary | ICD-10-CM | POA: Diagnosis not present

## 2019-02-15 DIAGNOSIS — I340 Nonrheumatic mitral (valve) insufficiency: Secondary | ICD-10-CM

## 2019-02-15 DIAGNOSIS — I959 Hypotension, unspecified: Secondary | ICD-10-CM | POA: Diagnosis not present

## 2019-02-15 DIAGNOSIS — Z1159 Encounter for screening for other viral diseases: Secondary | ICD-10-CM | POA: Diagnosis not present

## 2019-02-15 DIAGNOSIS — I10 Essential (primary) hypertension: Secondary | ICD-10-CM | POA: Diagnosis not present

## 2019-02-15 LAB — CBC
HCT: 31.4 % — ABNORMAL LOW (ref 39.0–52.0)
Hemoglobin: 9.5 g/dL — ABNORMAL LOW (ref 13.0–17.0)
MCH: 23.5 pg — ABNORMAL LOW (ref 26.0–34.0)
MCHC: 30.3 g/dL (ref 30.0–36.0)
MCV: 77.7 fL — ABNORMAL LOW (ref 80.0–100.0)
Platelets: 237 10*3/uL (ref 150–400)
RBC: 4.04 MIL/uL — ABNORMAL LOW (ref 4.22–5.81)
RDW: 14.9 % (ref 11.5–15.5)
WBC: 5.4 10*3/uL (ref 4.0–10.5)
nRBC: 0 % (ref 0.0–0.2)

## 2019-02-15 LAB — BASIC METABOLIC PANEL
Anion gap: 8 (ref 5–15)
BUN: 11 mg/dL (ref 8–23)
CO2: 25 mmol/L (ref 22–32)
Calcium: 8.9 mg/dL (ref 8.9–10.3)
Chloride: 108 mmol/L (ref 98–111)
Creatinine, Ser: 0.93 mg/dL (ref 0.61–1.24)
GFR calc Af Amer: >60 mL/min (ref >60)
GFR calc non Af Amer: >60 mL/min (ref >60)
Glucose, Bld: 109 mg/dL — ABNORMAL HIGH (ref 70–99)
Potassium: 4.2 mmol/L (ref 3.5–5.1)
Sodium: 141 mmol/L (ref 135–145)

## 2019-02-15 LAB — GLUCOSE, CAPILLARY
Glucose-Capillary: 193 mg/dL — ABNORMAL HIGH (ref 70–99)
Glucose-Capillary: 101 mg/dL — ABNORMAL HIGH (ref 70–99)

## 2019-02-15 LAB — TROPONIN I
Troponin I: <0.03 ng/mL (ref <0.03)
Troponin I: <0.03 ng/mL (ref <0.03)

## 2019-02-15 LAB — TSH: TSH: 1.471 u[IU]/mL (ref 0.350–4.500)

## 2019-02-15 LAB — IRON AND TIBC
Iron: 27 ug/dL — ABNORMAL LOW (ref 45–182)
Saturation Ratios: 6 % — ABNORMAL LOW (ref 17.9–39.5)
TIBC: 452 ug/dL — ABNORMAL HIGH (ref 250–450)
UIBC: 425 ug/dL

## 2019-02-15 LAB — HEMOGLOBIN A1C
Hgb A1c MFr Bld: 6.2 % — ABNORMAL HIGH (ref 4.8–5.6)
Mean Plasma Glucose: 131.24 mg/dL

## 2019-02-15 LAB — SARS CORONAVIRUS 2: SARS Coronavirus 2: NOT DETECTED

## 2019-02-15 LAB — ECHOCARDIOGRAM COMPLETE

## 2019-02-15 LAB — FERRITIN: Ferritin: 16 ng/mL — ABNORMAL LOW (ref 24–336)

## 2019-02-15 MED ORDER — FLUTICASONE PROPIONATE 50 MCG/ACT NA SUSP
2.0000 | Freq: Every day | NASAL | Status: DC
  Administered 2019-02-15: 2 via NASAL
  Filled 2019-02-15: qty 16

## 2019-02-15 MED ORDER — ACETAMINOPHEN 650 MG RE SUPP: 650.0000 mg | Freq: Four times a day (QID) | RECTAL | Status: DC | PRN

## 2019-02-15 MED ORDER — POLYETHYLENE GLYCOL 3350 17 G PO PACK: 17.0000 g | PACK | Freq: Every day | ORAL | Status: DC | PRN

## 2019-02-15 MED ORDER — FOLIC ACID 1 MG PO TABS
1.0000 mg | ORAL_TABLET | Freq: Every day | ORAL | Status: DC
  Administered 2019-02-15: 1 mg via ORAL
  Filled 2019-02-15: qty 1

## 2019-02-15 MED ORDER — OXYCODONE-ACETAMINOPHEN 5-325 MG PO TABS: 1.0000 | ORAL_TABLET | ORAL | Status: DC | PRN

## 2019-02-15 MED ORDER — FINASTERIDE 5 MG PO TABS: 5.0000 mg | ORAL_TABLET | Freq: Every day | ORAL | Status: DC

## 2019-02-15 MED ORDER — PANTOPRAZOLE SODIUM 40 MG PO TBEC
80.0000 mg | DELAYED_RELEASE_TABLET | Freq: Every day | ORAL | Status: DC
  Administered 2019-02-15: 80 mg via ORAL
  Filled 2019-02-15: qty 2

## 2019-02-15 MED ORDER — PERFLUTREN LIPID MICROSPHERE
INTRAVENOUS | Status: AC
  Administered 2019-02-15: 2 mL via INTRAVENOUS
  Filled 2019-02-15: qty 10

## 2019-02-15 MED ORDER — ACETAMINOPHEN 325 MG PO TABS
650.0000 mg | ORAL_TABLET | Freq: Four times a day (QID) | ORAL | Status: DC | PRN
  Administered 2019-02-15: 650 mg via ORAL
  Filled 2019-02-15: qty 2

## 2019-02-15 MED ORDER — OXYCODONE-ACETAMINOPHEN 10-325 MG PO TABS: 1.0000 | ORAL_TABLET | ORAL | Status: DC | PRN

## 2019-02-15 MED ORDER — BREXPIPRAZOLE 2 MG PO TABS
2.0000 mg | ORAL_TABLET | Freq: Every day | ORAL | Status: DC
  Administered 2019-02-15: 2 mg via ORAL
  Filled 2019-02-15: qty 1

## 2019-02-15 MED ORDER — MAGNESIUM OXIDE 400 (241.3 MG) MG PO TABS
400.0000 mg | ORAL_TABLET | Freq: Two times a day (BID) | ORAL | Status: DC
  Administered 2019-02-15 (×2): 400 mg via ORAL
  Filled 2019-02-15 (×2): qty 1

## 2019-02-15 MED ORDER — BUPROPION HCL ER (SR) 100 MG PO TB12
200.0000 mg | ORAL_TABLET | Freq: Two times a day (BID) | ORAL | Status: DC
  Administered 2019-02-15: 200 mg via ORAL
  Filled 2019-02-15 (×2): qty 2

## 2019-02-15 MED ORDER — DULOXETINE HCL 60 MG PO CPEP
60.0000 mg | ORAL_CAPSULE | Freq: Two times a day (BID) | ORAL | Status: DC
  Administered 2019-02-15: 60 mg via ORAL
  Filled 2019-02-15 (×2): qty 1

## 2019-02-15 MED ORDER — FERROUS SULFATE 325 (65 FE) MG PO TABS: 325.0000 mg | ORAL_TABLET | ORAL | 0 refills | Status: DC

## 2019-02-15 MED ORDER — ONDANSETRON HCL 4 MG PO TABS: 4.0000 mg | ORAL_TABLET | Freq: Four times a day (QID) | ORAL | Status: DC | PRN

## 2019-02-15 MED ORDER — TIZANIDINE HCL 4 MG PO TABS: 4.0000 mg | ORAL_TABLET | Freq: Three times a day (TID) | ORAL | Status: DC | PRN

## 2019-02-15 MED ORDER — LEVOTHYROXINE SODIUM 125 MCG PO TABS
125.0000 ug | ORAL_TABLET | Freq: Every day | ORAL | Status: DC
  Administered 2019-02-15: 125 ug via ORAL
  Filled 2019-02-15 (×2): qty 1

## 2019-02-15 MED ORDER — CALCIUM CITRATE 950 (200 CA) MG PO TABS
200.0000 mg | ORAL_TABLET | Freq: Every day | ORAL | Status: DC
  Administered 2019-02-15: 200 mg via ORAL
  Filled 2019-02-15: qty 1

## 2019-02-15 MED ORDER — ASPIRIN 81 MG PO CHEW
81.0000 mg | CHEWABLE_TABLET | Freq: Every day | ORAL | Status: DC
  Administered 2019-02-15: 81 mg via ORAL
  Filled 2019-02-15: qty 1

## 2019-02-15 MED ORDER — OXYCODONE HCL 5 MG PO TABS: 5.0000 mg | ORAL_TABLET | ORAL | Status: DC | PRN

## 2019-02-15 MED ORDER — MIRTAZAPINE 30 MG PO TBDP: 60.0000 mg | ORAL_TABLET | Freq: Every day | ORAL | Status: DC

## 2019-02-15 MED ORDER — ONDANSETRON HCL 4 MG/2ML IJ SOLN: 4.0000 mg | Freq: Four times a day (QID) | INTRAMUSCULAR | Status: DC | PRN

## 2019-02-15 MED ORDER — ATORVASTATIN CALCIUM 10 MG PO TABS: 20.0000 mg | ORAL_TABLET | Freq: Every day | ORAL | Status: DC

## 2019-02-15 MED ORDER — INSULIN ASPART 100 UNIT/ML ~~LOC~~ SOLN: 0.0000 [IU] | Freq: Three times a day (TID) | SUBCUTANEOUS | Status: DC

## 2019-02-15 MED ORDER — ADULT MULTIVITAMIN W/MINERALS CH
1.0000 | ORAL_TABLET | Freq: Every day | ORAL | Status: DC
  Administered 2019-02-15: 1 via ORAL
  Filled 2019-02-15: qty 1

## 2019-02-15 MED ORDER — BUSPIRONE HCL 5 MG PO TABS
20.0000 mg | ORAL_TABLET | Freq: Two times a day (BID) | ORAL | Status: DC
  Administered 2019-02-15 (×2): 20 mg via ORAL
  Filled 2019-02-15 (×2): qty 4

## 2019-02-15 MED ORDER — PRAMIPEXOLE DIHYDROCHLORIDE 1.5 MG PO TABS
3.0000 mg | ORAL_TABLET | Freq: Every day | ORAL | Status: DC
  Filled 2019-02-15: qty 2

## 2019-02-15 MED ORDER — INSULIN ASPART 100 UNIT/ML ~~LOC~~ SOLN
0.0000 [IU] | Freq: Three times a day (TID) | SUBCUTANEOUS | Status: DC
  Administered 2019-02-15: 2 [IU] via SUBCUTANEOUS

## 2019-02-15 MED ORDER — PERFLUTREN LIPID MICROSPHERE
1.0000 mL | INTRAVENOUS | Status: AC | PRN
  Filled 2019-02-15: qty 10

## 2019-02-15 MED ORDER — MIRTAZAPINE 30 MG PO TABS
60.0000 mg | ORAL_TABLET | Freq: Every day | ORAL | Status: DC
  Filled 2019-02-15: qty 2

## 2019-02-15 MED ORDER — FERROUS SULFATE 325 (65 FE) MG PO TABS: 325.0000 mg | ORAL_TABLET | Freq: Every day | ORAL | Status: DC

## 2019-02-15 MED ORDER — FINASTERIDE 5 MG PO TABS
5.0000 mg | ORAL_TABLET | Freq: Every day | ORAL | Status: DC
  Administered 2019-02-15: 5 mg via ORAL
  Filled 2019-02-15: qty 1

## 2019-02-15 MED ORDER — LORATADINE 10 MG PO TABS
10.0000 mg | ORAL_TABLET | Freq: Every day | ORAL | Status: DC
  Administered 2019-02-15: 10 mg via ORAL
  Filled 2019-02-15 (×2): qty 1

## 2019-02-15 MED ORDER — ENOXAPARIN SODIUM 40 MG/0.4ML ~~LOC~~ SOLN: 40.0000 mg | SUBCUTANEOUS | Status: DC

## 2019-02-15 NOTE — ED Notes (Signed)
ED TO INPATIENT HANDOFF REPORT  ED Nurse Name and Phone #: Caprice Kluver [phone]  S Name/Age/Gender [name] 63 y.o. male Room/Bed: 023C/023C  Code Status   Code Status: Prior  Home/SNF/Other Home Patient oriented to: self, place, time and situation Is this baseline? Yes   Triage Complete: Triage complete  Chief Complaint faint hypotensive  Triage Note Pt reports multiple syncopal episodes on Monday with hypotension. Pt seen at [facility], given fluids and then discharged to follow up with cardiology. Wife called cardiology today and was told to come here for further evaluation. Pt has had intermittent hypotension. Pt looks pale. Has a [place] ICD. Pt alert and oriented at this time. No pain   Allergies Allergies  Allergen Reactions  . Morphine Itching    Can take with benadry, facial and nose itching     Level of Care/Admitting Diagnosis ED Disposition    ED Disposition Condition [facility] Area: [place] [[id number]]  Level of Care: Telemetry Medical [104]  Covid Evaluation: Screening Protocol (No Symptoms)  Diagnosis: Syncope [960454]  Admitting Physician: Matilde Haymaker [[id number]]  Attending Physician: Owens Shark, CARINA Jerilynn Mages [[id number]]  PT Class (Do Not Modify): Observation [104]  PT Acc Code (Do Not Modify): Observation [10022]       B Medical/Surgery History Past Medical History:  Diagnosis Date  . BPH (benign prostatic hyperplasia)   . Depression   . Diabetes mellitus   . Glaucoma   . Hypertension   . Presence of permanent cardiac pacemaker    Past Surgical History:  Procedure Laterality Date  . ANTERIOR CERVICAL DECOMP/DISCECTOMY FUSION N/A 11/17/2018   Procedure: CERVICAL THREE-CERVICAL FOUR, CERVICAL FOUR-CERVICAL FIVE, CERVICAL FIVE-CERVICAL SIX ANTERIOR CERVICAL DECOMPRESSION/DISCECTOMY FUSION;  Surgeon: Earnie Larsson, MD;  Location: [place];  Service: Neurosurgery;  Laterality: N/A;  . BACK SURGERY     x4  . FOOT  NEUROMA SURGERY    . GASTRIC BYPASS    . HEMORRHOID SURGERY     over 30 years ago  . HERNIA REPAIR     LIH umb  . NASAL SINUS SURGERY     x2  . NECK SURGERY       A IV Location/Drains/Wounds Patient Lines/Drains/Airways Status   Active Line/Drains/Airways    Name:   Placement date:   Placement time:   Site:   Days:   Peripheral IV 02/14/19 Right Antecubital   02/14/19    2141    Antecubital   1   Incision (Closed) 11/17/18 Neck Other (Comment)   11/17/18    1321     90          Intake/Output Last 24 hours No intake or output data in the 24 hours ending 02/15/19 0006  Labs/Imaging Results for orders placed or performed during the hospital encounter of 02/14/19 (from the past 48 hour(s))  Urinalysis, Routine w reflex microscopic     Status: Abnormal   Collection Time: 02/14/19  8:24 PM  Result Value Ref Range   Color, Urine YELLOW YELLOW   APPearance CLEAR CLEAR   Specific Gravity, Urine 1.019 1.005 - 1.030   pH 5.0 5.0 - 8.0   Glucose, UA 50 (A) NEGATIVE mg/dL   Hgb urine dipstick NEGATIVE NEGATIVE   Bilirubin Urine NEGATIVE NEGATIVE   Ketones, ur NEGATIVE NEGATIVE mg/dL   Protein, ur NEGATIVE NEGATIVE mg/dL   Nitrite NEGATIVE NEGATIVE   Leukocytes,Ua NEGATIVE NEGATIVE    Comment: Performed at [facility], [address] [place], [place] [address]  Basic metabolic panel     Status: Abnormal   Collection Time: 02/14/19  8:28 PM  Result Value Ref Range   Sodium 138 135 - 145 mmol/L   Potassium 4.1 3.5 - 5.1 mmol/L   Chloride 106 98 - 111 mmol/L   CO2 23 22 - 32 mmol/L   Glucose, Bld 250 (H) 70 - 99 mg/dL   BUN 12 8 - 23 mg/dL   Creatinine, Ser 1.00 0.61 - 1.24 mg/dL   Calcium 9.1 8.9 - 10.3 mg/dL   GFR calc non Af Amer >60 >60 mL/min   GFR calc Af Amer >60 >60 mL/min   Anion gap 9 5 - 15    Comment: Performed at [facility] [address], [place], [place] [address]  CBC     Status: Abnormal   Collection Time: 02/14/19  8:28 PM  Result Value  Ref Range   WBC 6.2 4.0 - 10.5 K/uL   RBC 4.29 4.22 - 5.81 MIL/uL   Hemoglobin 10.0 (L) 13.0 - 17.0 g/dL   HCT 34.4 (L) 39.0 - 52.0 %   MCV 80.2 80.0 - 100.0 fL   MCH 23.3 (L) 26.0 - 34.0 pg   MCHC 29.1 (L) 30.0 - 36.0 g/dL   RDW 14.9 11.5 - 15.5 %   Platelets 245 150 - 400 K/uL   nRBC 0.0 0.0 - 0.2 %    Comment: Performed at [facility], [place] [address], [place], [place] [address]  Hepatic function panel     Status: Abnormal   Collection Time: 02/14/19  9:40 PM  Result Value Ref Range   Total Protein 5.7 (L) 6.5 - 8.1 g/dL   Albumin 3.5 3.5 - 5.0 g/dL   AST 22 15 - 41 U/L   ALT 25 0 - 44 U/L   Alkaline Phosphatase 103 38 - 126 U/L   Total Bilirubin 0.4 0.3 - 1.2 mg/dL   Bilirubin, Direct <0.1 0.0 - 0.2 mg/dL   Indirect Bilirubin NOT CALCULATED 0.3 - 0.9 mg/dL    Comment: Performed at [facility] [address] [place], [place] [address]  Troponin I - Once     Status: None   Collection Time: 02/14/19  9:40 PM  Result Value Ref Range   Troponin I <0.03 <0.03 ng/mL    Comment: Performed at [facility] [address], [place], [place] [address]  D-dimer, quantitative     Status: Abnormal   Collection Time: 02/14/19  9:40 PM  Result Value Ref Range   D-Dimer, Quant 0.94 (H) 0.00 - 0.50 ug/mL-FEU    Comment: (NOTE) At the manufacturer cut-off of 0.50 ug/mL FEU, this assay has been documented to exclude PE with a sensitivity and negative predictive value of 97 to 99%.  At this time, this assay has not been approved by the FDA to exclude DVT/VTE. Results should be correlated with clinical presentation. Performed at [facility], [place] [address] [place], [place] [address]   Brain natriuretic peptide     Status: None   Collection Time: 02/14/19  9:40 PM  Result Value Ref Range   B Natriuretic Peptide 43.7 0.0 - 100.0 pg/mL    Comment: Performed at [facility] [address] [place], [place] [address]  Lactic acid, plasma     Status: None    Collection Time: 02/14/19  9:40 PM  Result Value Ref Range   Lactic  Acid, Venous 1.7 0.5 - 1.9 mmol/L    Comment: Performed at [facility] [address], [place], [place] [address]  SARS Coronavirus 2     Status: None   Collection Time: 02/14/19  9:40 PM  Result Value Ref Range   SARS Coronavirus 2 NOT DETECTED NOT DETECTED    Comment: (NOTE) SARS-CoV-2 target nucleic acids are NOT DETECTED. The SARS-CoV-2 RNA is generally detectable in upper and lower respiratory specimens during the acute phase of infection.  Negative  results do not preclude SARS-CoV-2 infection, do not rule out co-infections with other pathogens, and should not be used as the sole basis for treatment or other patient management decisions.  Negative results must be combined with clinical observations, patient history, and epidemiological information. The expected result is Not Detected. Fact Sheet for Patients: http://www.biofiredefense.com/wp-content/uploads/2020/03/BIOFIRE-COVID -19-patients.pdf Fact Sheet for Healthcare Providers: http://www.biofiredefense.com/wp-content/uploads/2020/03/BIOFIRE-COVID -19-hcp.pdf This test is not yet approved or cleared by the [place] and  has been authorized for detection and/or diagnosis of SARS-CoV-2 by FDA under an Emergency Use Authorization (EUA).  This EUA will remain in effec t (meaning this test can be used) for the duration of  the COVID-19 declaration under Section 564(b)(1) of the Act, 21 U.S.C. section 360bbb-3(b)(1), unless the authorization is terminated or revoked sooner. Performed at [facility], [place] [address], [place], [place] [address]    Ct Angio Chest Pe W And/or Wo Contrast  Result Date: 02/14/2019 CLINICAL DATA:  Syncopal episodes.  Hypotension. EXAM: CT ANGIOGRAPHY CHEST WITH CONTRAST TECHNIQUE: Multidetector CT imaging of the chest was performed using the standard protocol during bolus administration of intravenous  contrast. Multiplanar CT image reconstructions and MIPs were obtained to evaluate the vascular anatomy. CONTRAST:  20mL OMNIPAQUE IOHEXOL 350 MG/ML SOLN COMPARISON:  None. FINDINGS: Cardiovascular: The ascending thoracic aorta is ectatic measuring approximately 4.4 cm in diameter. Heart size is mildly enlarged. The patient appears to be status post prior aortic valve replacement. An ICD is noted. Mild aortic calcifications are noted Mediastinum/Nodes: No enlarged mediastinal, hilar, or axillary lymph nodes. Thyroid gland, trachea, and esophagus demonstrate no significant findings. Lungs/Pleura: There are few stable peripheral nodules in the right middle lobe. The lungs are otherwise clear. There is no pneumothorax. No large pleural effusion. The trachea is unremarkable. Upper Abdomen: The patient appears to be status post gastric bypass. There is a small fat containing lesion adjacent to the posterior right hepatic lobe, of doubtful clinical significance. Musculoskeletal: No chest wall abnormality. No acute or significant osseous findings. Review of the MIP images confirms the above findings. IMPRESSION: 1. No PE. 2. The lungs are clear. 3. Ectatic ascending thoracic aorta measuring approximately 4.4 cm in diameter. Follow-up is recommended. Recommend annual imaging followup by CTA or MRA. This recommendation follows 2010 ACCF/AHA/AATS/ACR/ASA/SCA/SCAI/SIR/STS/SVM Guidelines for the Diagnosis and Management of Patients with Thoracic Aortic Disease. Circulation. 2010; 121: E938-B017. Aortic aneurysm NOS (ICD10-I71.9) Aortic Atherosclerosis (ICD10-I70.0). Electronically Signed   By: Constance Holster M.D.   On: 02/14/2019 23:09   Dg Chest Portable 1 View  Result Date: 02/14/2019 CLINICAL DATA:  Weakness EXAM: PORTABLE CHEST 1 VIEW COMPARISON:  02/12/2019, 09/22/2018 FINDINGS: Post sternotomy changes. Left-sided pacing device as before. Valvular prosthesis. No acute airspace disease or effusion. Stable  cardiomediastinal silhouette. No pneumothorax. IMPRESSION: No active disease. Electronically Signed   By: Donavan Foil M.D.   On: 02/14/2019 21:50    Pending Labs [employer] (From admission, onward)    Start  Ordered   02/14/19 2126  Lactic acid, plasma  Now then every 2 hours,   STAT     02/14/19 2126          Vitals/Pain Today's Vitals   02/14/19 2023 02/14/19 2119 02/14/19 2127 02/14/19 2130  BP:   (!) 142/88 140/85  Pulse:  92 92 91  Resp:  18 (!) 21 19  Temp:      TempSrc:      SpO2:  97% 98% 97%  PainSc: 0-No pain       Isolation Precautions No active isolations  Medications Medications  sodium chloride flush (NS) 0.9 % injection 3 mL (has no administration in time range)  iohexol (OMNIPAQUE) 350 MG/ML injection 80 mL (80 mLs Intravenous Contrast Given 02/14/19 2248)    Mobility walks High fall risk   Focused Assessments NA   R Recommendations: See Admitting Provider Note  Report given to:   Additional Notes:

## 2019-02-15 NOTE — Evaluation (Signed)
Physical Therapy Evaluation Patient Details Name: Antonio Oliver MRN: [id number] DOB: 27-May-1956 Today's Date: 02/15/2019   History of Present Illness  Pt is a 63 y/o male admitted secondary to syncopal episode secondary to orthostatic hyptension. PMH includes DM, HTN, aortic stenosis s/p valve replacement, and L foot fx.   Clinical Impression  Pt admitted secondary to problem above with deficits below. Pt tolerated gait training well, and only presenting with mild unsteadiness. Pt requiring min guard A for mobility with cane. Reports wife can assist at home and reports he is close to baseline. Pt reports he just finished with HHPT and does not feel he will need follow up therapy at home. Will continue to follow acutely to maximize functional mobility independence and safety.     Follow Up Recommendations No PT follow up;Supervision for mobility/OOB    Equipment Recommendations  None recommended by PT    Recommendations for Other Services       Precautions / Restrictions Precautions Precautions: Fall Precaution Comments: Pt with CAM walker boot on LLE Restrictions Weight Bearing Restrictions: Yes LLE Weight Bearing: Weight bearing as tolerated Other Position/Activity Restrictions: WBAT in CAM boot per pt.       Mobility  Bed Mobility Overal bed mobility: Modified Independent                Transfers Overall transfer level: Needs assistance Equipment used: Straight cane Transfers: Sit to/from Stand Sit to Stand: Min guard         General transfer comment: Min guard for safety.   Ambulation/Gait Ambulation/Gait assistance: Min guard Gait Distance (Feet): 120 Feet Assistive device: Straight cane Gait Pattern/deviations: Step-through pattern;Decreased stride length Gait velocity: Decreased   General Gait Details: Mild unsteadiness during gait, however, no LOB noted. Pt reports he is close to baseline with gait. Asymptomatic throughout.   Stairs Stairs: Yes        General stair comments: Verbally reviewed safe stair navigation with step to pattern and LE sequencing.   Wheelchair Mobility    Modified Rankin (Stroke Patients Only)       Balance Overall balance assessment: Needs assistance Sitting-balance support: No upper extremity supported;Feet supported Sitting balance-Leahy Scale: Good     Standing balance support: Single extremity supported;During functional activity Standing balance-Leahy Scale: Poor Standing balance comment: Reliant on at least 1 UE support                              Pertinent Vitals/Pain Pain Assessment: No/denies pain    Home Living Family/patient expects to be discharged to:: Private residence Living Arrangements: Spouse/significant other Available Help at Discharge: Family;Available 24 hours/day Type of Home: House Home Access: Stairs to enter Entrance Stairs-Rails: Left Entrance Stairs-Number of Steps: 5 Home Layout: One level Home Equipment: Bedside commode;Grab bars - tub/shower;Grab bars - toilet;Walker - 2 wheels;Cane - single point;Wheelchair - [employer] - 4 wheels      Prior Function Level of Independence: Needs assistance   Gait / Transfers Assistance Needed: Reports use of cane for ambulation. Wife assists with stair navigation.            Hand Dominance   Dominant Hand: Right    Extremity/Trunk Assessment   Upper Extremity Assessment Upper Extremity Assessment: Defer to OT evaluation    Lower Extremity Assessment Lower Extremity Assessment: LLE deficits/detail;Generalized weakness LLE Deficits / Details: L foot fx at baseline     Cervical / Trunk Assessment Cervical / Trunk  Assessment: Normal  Communication   Communication: No difficulties  Cognition Arousal/Alertness: Awake/alert Behavior During Therapy: WFL for tasks assessed/performed Overall Cognitive Status: Within Functional Limits for tasks assessed                                         General Comments      Exercises     Assessment/Plan    PT Assessment Patient needs continued PT services  PT Problem List Decreased strength;Decreased balance;Decreased mobility       PT Treatment Interventions DME instruction;Gait training;Stair training;Functional mobility training;Therapeutic activities;Therapeutic exercise;Balance training;Patient/family education    PT Goals (Current goals can be found in the Care Plan section)  Acute Rehab PT Goals Patient Stated Goal: to go home today PT Goal Formulation: With patient Time For Goal Achievement: 03/01/19 Potential to Achieve Goals: Good    Frequency Min 3X/week   Barriers to discharge        Co-evaluation               AM-PAC PT "6 Clicks" Mobility  Outcome Measure Help needed turning from your back to your side while in a flat bed without using bedrails?: None Help needed moving from lying on your back to sitting on the side of a flat bed without using bedrails?: None Help needed moving to and from a bed to a chair (including a wheelchair)?: A Little Help needed standing up from a chair using your arms (e.g., wheelchair or bedside chair)?: A Little Help needed to walk in hospital room?: A Little Help needed climbing 3-5 steps with a railing? : A Lot 6 Click Score: 19    End of Session Equipment Utilized During Treatment: Gait belt Activity Tolerance: Patient tolerated treatment well Patient left: in bed;with call bell/phone within reach(sitting EOB ) Nurse Communication: Mobility status PT Visit Diagnosis: Other abnormalities of gait and mobility (R26.89);Muscle weakness (generalized) (M62.81)    Time: 4332-9518 PT Time Calculation (min) (ACUTE ONLY): 15 min   Charges:   PT Evaluation $PT Eval Low Complexity: Boyd, PT, DPT  Acute Rehabilitation Services  Pager: [phone] Office: [phone]   Rudean Hitt 02/15/2019, 3:28  PM

## 2019-02-15 NOTE — Progress Notes (Addendum)
Family Medicine Teaching Service Daily Progress Note Intern Pager: [phone]  Patient name: Antonio Oliver Medical record number: [id number] Date of birth: Jan 26, 1956 Age: 63 y.o. Gender: male  Primary Care Provider: Cher Nakai, MD Consultants: Cardiology Code Status: DNR  Pt Overview and Major Events to Date:  6/10 Admitted to FPTS  Assessment and Plan: Antonio Oliver is a 63 y.o. male presenting with multiple episodes of near syncope and 1 episode of syncope.  His past medical history is significant for aortic stenosis s/p replacement with bio-prosthetic valve (2018),  Complete heart block s/p PPM,  Cervical myelopathy s/p surgical intervention (11/2018), hypothyroidism, DMT2, MDD, allergies, GERD  Syncope likely 2/2 orthostatic hypotension Patient has been having hypotension while home.  Follows closely with his cardiologist, was taken off of some home blood pressure medications (Lasix, lisinopril, metoprolol) on Monday, 6/8 due to blood pressures in the 80s over 40s. Patient reports that these medications are new in the last 3 months.  Wife had reported patient had hypotension during syncopal episode yesterday.  BP on admission 155/86, remains 154/86 this AM.  Orthostatics performed while inpatient and were negative.  Per cardiology, do not believe syncope is cardiac in origin.  Do recommend obtaining echo to assess prosthetic AV function and check EF.  Pacemaker interrogation performed in ED, without any significant recent events.  Patient's most recent head CT for work-up as outpatient for these complaints of weakness and "feeling washed out" on 11/14/2018 was within normal limits.  Suspect that patient's syncope and weakness most likely secondary to polypharmacy given his numerous psychiatric medications as well as opioids.  Patient's TSH was within normal limits. -Cardiology following, appreciate recommendations -Consult pharmacy regarding medication interaction in the setting of  polypharmacy -Telemetry -Follow-up echocardiogram -monitor vitals -PT/OT consult  Lower extremity edema Admitting team had noted possible erythema of right ankle, not appreciated on exam this AM.  Patient does have trace edema of right lower extremity, left lower extremity unable to be compared as is been walking boot.  Patient did state he had been having worsening edema of his bilateral lower extremities during the week which is part of the reason he was told to come to the ED by his cardiologist.  His Lasix has been held since 6/8. -monitor closely, start antibiotics if worsens or clinically appropriate  EKG changes EKG in the ED was remarkable for ST depressions in V4 through V6, per read, no changes from previous.  EKG this AM also unchanged. Troponin negative x2.  Patient remains without chest pain. -Telemetry -Trend troponin  Anemia, normocytic: chronic, stable Hemoglobin 10.12 November 2018, 9.5 this a.m.  Iron panel suggestive of iron deficiency anemia with low iron of 27 and high TIBC of 452, ferritin low at 16. -Recommend iron supplementation - outpatient colonscopy  CAD History of aortic stenosis s/p replacement with bioprosthetic valve in 2018, history of complete heart block s/p PPM. -Atorvastatin 20 mg -ASA 81  Chronic pain Chronic pain is related to surgery performed on his left leg on 09/2018.  He has had chronic pain in his foot following the surgery for which he takes Percocet as often as every 4 hours.  Duloxetine was recently added for possible pain relief.  He plans on following up with podiatry outpatient for management of his foot deformity and pain.  His chronic opioid use may be contributing to the above syncope. -Percocet 10-325 every 4 hours as needed -Duloxetine, would recommend discontinuing given many psychiatric medications, will assess patient's benefit -  d/c zanaflex due to possible sedative effect   Diabetes, type II: well-controlled A1c 6.2.  Home  medication includes metformin and semaglutide.  CBG this a.m. 193. -Holding home medications -SSI sensitive -Monitor CBGs  Hypothyroidism TSH within normal limits at 1.47. -Continue home Synthroid 125 mcg  MDD History of severe depression requiring multiple antidepressants.  He is previously tried ECT but discontinued due to memory loss.  The following medications are his home regimen: -Bupropion -BuSpar -Duloxetine -Mirtazapine -Pramipexole, will assess reason for usage as if for RLS should be on 0.5mg  QHS, but if for depression should be on 1mg  TID, patient currently on 3mg  QHS -brexpiprazole -will discuss with pharmacy regarding multiple psych medications that may be contributing to presentation  Allergies -Loratadine per pharmacy -Fluticasone nasal spray  GERD -Tizanidine  BPH Home medication includes Silodosin, finasteride.  -Restart home finasteride -Bladder scans every 8 hours - holding silodosin  FEN/GI: Heart healthy/carb modified PPx: Lovenox  Disposition: Pending clinical improvement, PT OT recs  Subjective:  Patient endorses slight headache this morning, for which he has not yet received PRN medications.  Otherwise denies any complaints.  States that he is feeling very well, has not had any further syncopal episodes, and request to leave today.  Objective: Temp:  [98.2 F (36.8 C)-98.8 F (37.1 C)] 98.6 F (37 C) (06/11 0734) Pulse Rate:  [85-102] 85 (06/11 0734) Resp:  [15-21] 18 (06/11 0734) BP: (140-156)/(85-100) 154/86 (06/11 0734) SpO2:  [97 %-100 %] 98 % (06/11 0734)  Physical Exam:  General: 63 y.o. male in NAD Cardio: RRR, systolic murmur Lungs: CTAB, no wheezing, no rhonchi, no crackles, no IWOB on RA Abdomen: Soft, non-tender to palpation, non-distended, positive bowel sounds Skin: warm and dry Extremities: Trace edema of right lower extremity, no erythema noted, walking boot in place on left leg from leg  fracture   Laboratory: Recent Labs  Lab 02/14/19 2028 02/15/19 0531  WBC 6.2 5.4  HGB 10.0* 9.5*  HCT 34.4* 31.4*  PLT 245 237   Recent Labs  Lab 02/14/19 2028 02/14/19 2140 02/15/19 0531  NA 138  --  141  K 4.1  --  4.2  CL 106  --  108  CO2 23  --  25  BUN 12  --  11  CREATININE 1.00  --  0.93  CALCIUM 9.1  --  8.9  PROT  --  5.7*  --   BILITOT  --  0.4  --   ALKPHOS  --  103  --   ALT  --  25  --   AST  --  22  --   GLUCOSE 250*  --  109*     Imaging/Diagnostic Tests: Ct Angio Chest Pe W And/or Wo Contrast  Result Date: 02/14/2019 CLINICAL DATA:  Syncopal episodes.  Hypotension. EXAM: CT ANGIOGRAPHY CHEST WITH CONTRAST TECHNIQUE: Multidetector CT imaging of the chest was performed using the standard protocol during bolus administration of intravenous contrast. Multiplanar CT image reconstructions and MIPs were obtained to evaluate the vascular anatomy. CONTRAST:  96mL OMNIPAQUE IOHEXOL 350 MG/ML SOLN COMPARISON:  None. FINDINGS: Cardiovascular: The ascending thoracic aorta is ectatic measuring approximately 4.4 cm in diameter. Heart size is mildly enlarged. The patient appears to be status post prior aortic valve replacement. An ICD is noted. Mild aortic calcifications are noted Mediastinum/Nodes: No enlarged mediastinal, hilar, or axillary lymph nodes. Thyroid gland, trachea, and esophagus demonstrate no significant findings. Lungs/Pleura: There are few stable peripheral nodules in the right middle lobe.  The lungs are otherwise clear. There is no pneumothorax. No large pleural effusion. The trachea is unremarkable. Upper Abdomen: The patient appears to be status post gastric bypass. There is a small fat containing lesion adjacent to the posterior right hepatic lobe, of doubtful clinical significance. Musculoskeletal: No chest wall abnormality. No acute or significant osseous findings. Review of the MIP images confirms the above findings. IMPRESSION: 1. No PE. 2. The lungs are  clear. 3. Ectatic ascending thoracic aorta measuring approximately 4.4 cm in diameter. Follow-up is recommended. Recommend annual imaging followup by CTA or MRA. This recommendation follows 2010 ACCF/AHA/AATS/ACR/ASA/SCA/SCAI/SIR/STS/SVM Guidelines for the Diagnosis and Management of Patients with Thoracic Aortic Disease. Circulation. 2010; 121: V728-A060. Aortic aneurysm NOS (ICD10-I71.9) Aortic Atherosclerosis (ICD10-I70.0). Electronically Signed   By: Constance Holster M.D.   On: 02/14/2019 23:09   Dg Chest Portable 1 View  Result Date: 02/14/2019 CLINICAL DATA:  Weakness EXAM: PORTABLE CHEST 1 VIEW COMPARISON:  02/12/2019, 09/22/2018 FINDINGS: Post sternotomy changes. Left-sided pacing device as before. Valvular prosthesis. No acute airspace disease or effusion. Stable cardiomediastinal silhouette. No pneumothorax. IMPRESSION: No active disease. Electronically Signed   By: Donavan Foil M.D.   On: 02/14/2019 21:50    [place], Bernita Raisin, DO 02/15/2019, 8:24 AM PGY-1, [place] Intern pager: [phone], text pages welcome

## 2019-02-15 NOTE — H&P (Addendum)
[facility] Admission History and Physical Service Pager: [phone]  Patient name: Antonio Oliver Medical record number: [id number] Date of birth: 1956/06/13 Age: 63 y.o. Gender: male  Primary Care Provider: Cher Nakai, MD Consultants: Cardiology Code Status: DNR  Chief Complaint: weakness and passing out  Assessment and Plan: [name] is a 62 y.o. male presenting with multiple episodes of near syncope and 1 episode of syncope.  His past medical history is significant for aortic stenosis s/p replacement with bio-prosthetic valve (2018),  Complete heart block s/p PPM,  Cervical myelopathy s/p surgical intervention (11/2018), hypothyroidism, DMT2, MDD, allergies, GERD  Syncope likely 2/2 orthostatic hypotension [name] presents to the ED with several episodes of near syncope and one episode of syncope.  Per his wife's report, there is clear evidence of hypotension during his syncopal episode.  On admission, he continues to feel significant weakness and "washed out"'. vitals are notable for blood pressure of 155/86 on admission.  Labs are largely unremarkable and notable for blood glucose of 250 and hemoglobin of 10.0, BNP 43, troponin less than 0.03, d-dimer 0.94.  EKG shows new mild ST depressions in V4 through V6.  CTA chest showed no evidence of PE.  Chest x-ray shows no active disease.  Differential for syncope at this time is most likely secondary to orthostatic hypotension.  The cause of this orthostatic hypotension is likely multifactorial.  Earlier in the week, his Lasix, lisinopril, metoprolol was stopped by cardiology.  This does not seem to have yet improved his hypotension.  He is also been seen in the ED on 6/8 given significant fluid resuscitation which has also not been sufficient.  Additional contributors may be polypharmacy, possible cellulitis noted on the right lower leg.  Dr. Laverta Baltimore, the ED provider, reported that he was told a remote pacemaker  interrogation did not show any significant recent events. Cardiology assessed pt in the ED and noted that his hypotension is unlikely cardiogenic and encouraged further medical work-up. Patient with normal head CT 11/14/18 that was obtained for weakness and loss of balance. [name] was admitted for further syncope work-up. -Admit to medical telemetry, attending Dr. Owens Shark -Cardiology following, appreciate recommendations -Consult pharmacy regarding medication interaction in the setting of polypharmacy -Telemetry -Follow-up echocardiogram -Orthostatic vitals -Follow-up TSH/A1c -monitor vitals -PT/OT consult -up with assistance, fall precautions -consider additional IV fluids  Questionable Cellulitis of RLE- mild erythema on R ankle, afebrile, no discharge or wounds noted -monitor closely, start antibiotics if worsens or clinically appropriate  EKG changes EKG in the ED was remarkable for ST depressions in V4 through V6.  Initial troponin 0 0.03.  No complaints of chest pain or palpitations.  Possible myocardial ischemia could be contributing to the above syncope. -Telemetry -A.m. EKG -Trend troponin  Anemia, normocytic Most recent hemoglobin 10.8 (11/2018). -Follow-up iron studies  CAD -Atorvastatin 20 mg -ASA 81  Chronic pain Is chronic pain is related to surgery performed on his left leg on 09/2018.  He has had chronic pain in his foot following the surgery for which he takes Percocet as often as every 4 hours.  Duloxetine was recently added for possible pain relief.  He plans on following up with podiatry outpatient for management of his foot deformity and pain.  His chronic opioid use may be contributing to the above syncope. -Percocet 10-325 every 4 hours as needed -Duloxetine  Diabetes, type II Home medication includes metformin and semaglutide. -Holding home medications -SSI sensitive -Monitor CBGs  Hypothyroidism -  Follow-up TSH -Synthroid 125 mcg  MDD Who has  history of severe depression requiring multiple antidepressants.  He is previously tried ECT but discontinued due to memory loss.  The following medications are his home regimen: -Bupropion -BuSpar -Duloxetine -Mirtazapine -Pramipexole -brexpiprazole -will discuss with pharmacy regarding multiple psych medications that may be contributing to presentation  Allergies -Loratadine per pharmacy -Fluticasone nasal spray  GERD -Tizanidine  BPH Home medication includes Silodosin, finasteride.  -Held home meds due to side effect of orthostatic hypotension  FEN/GI: Heart healthy/carb modified Prophylaxis: Lovenox  Disposition: Pending syncope work-up  History of Present Illness:  Antonio Oliver is a 63 y.o. male presenting with multiple episodes of near syncope and 1 episode of syncope.  His past medical history is significant for aortic stenosis s/p replacement with bio-prosthetic valve (2018),  Complete heart block s/p PPM,  Cervical myelopathy s/p surgical intervention (11/2018), hypothyroidism, DMT2, MDD, allergies, GERD.  [name] is accompanied by his wife who provides the majority of his medical history.  [name] did verify the pertinent components of his medical history provided by his wife.  Mr. [name] has been having issues with low blood pressure, weakness, falls for over a week now.  His wife reports that last week he had milder episodes of standing up, feeling weak and falling back into a chair.  During these episodes, she would take his blood pressure and find him often to be hypotensive.  Due to concern for a heart related issue, [name] had been following with his outpatient cardiologist.    On Monday, 6/8, he stood up from his seat outside and walked into the house when he began to feel incredibly weak and dizzy.  He sat down in a chair where his wife took his blood pressure which measured 80/40.  Shortly after taking his blood pressure, he lost consciousness for about 10  seconds.  He did not experiencing any muscle twitching, urinary or fecal incontinence, unusual eye movement.  He regained consciousness and continued to feel weak.  At that time, they decided to go to the emergency department where it was redemonstrated he was hypotensive to 76/44.  In the ED on 6/8, he was given 2 L fluid bolus which showed significant improvement in his blood pressure and he was sent home.  On the morning of 6/10, he again noted profound weakness and felt "washed out."  He reports feeling "worse than Monday" his cardiologist was called and recommended being seen in the emergency department again instead of waiting until their office visit on 6/12.  On review of systems, he denies fever, SOB, cough, chest pain, palpitations, urinary frequency.  He was admitted for a syncope work-up.  Review Of Systems: Per HPI with the following additions:   Review of Systems  Constitutional: Positive for malaise/fatigue. Negative for chills, fever and weight loss.  HENT: Negative for congestion, hearing loss, sore throat and tinnitus.   Eyes: Negative for blurred vision.  Respiratory: Negative for cough, hemoptysis and shortness of breath.   Cardiovascular: Positive for leg swelling. Negative for chest pain and palpitations.  Gastrointestinal: Negative for abdominal pain, blood in stool, constipation, diarrhea, heartburn, nausea and vomiting.  Genitourinary: Negative for dysuria.  Musculoskeletal: Negative for myalgias.  Skin: Negative for rash.  Neurological: Positive for dizziness, tremors (chronic), loss of consciousness and weakness.  Endo/Heme/Allergies: Does not bruise/bleed easily.  Psychiatric/Behavioral: Positive for depression.    Patient Active Problem List   Diagnosis Date Noted  . Syncope 02/14/2019  .  Bradycardia 02/01/2019  . Kidney stone 02/01/2019  . Nonrheumatic aortic valve stenosis 02/01/2019  . Surgery, elective   . Weakness   . Diabetes mellitus type 2 in  nonobese (HCC)   . Leukocytosis   . Nondisplaced fracture of medial cuneiform of left foot, initial encounter for closed fracture 11/16/2018  . Cervical myelopathy ([place]) 11/14/2018  . Diabetes ([place]) 10/30/2018  . Hyperlipidemia 10/30/2018  . Depression 10/30/2018  . Anxiety 10/30/2018  . Vitamin D deficiency 10/30/2018  . Osteoarthritis 10/30/2018  . GERD (gastroesophageal reflux disease) 10/30/2018  . Fatigue 10/30/2018  . Hypothyroidism 10/30/2018  . Essential hypertension 10/30/2018  . Mixed dyslipidemia 10/30/2018  . Diabetes mellitus due to underlying condition with unspecified complications ([place]) 95/63/8756  . Neoplasm of uncertain behavior of skin 07/28/2018  . Hemangioma of skin and subcutaneous tissue 07/07/2017  . Lentigo 07/07/2017  . Multiple actinic keratoses 07/07/2017  . Multiple benign melanocytic nevi 07/07/2017  . Other seborrheic keratosis 07/07/2017  . Complete heart block ([place]) 05/04/2017  . S/P AVR (aortic valve replacement) 03/10/2017  . Bicuspid aortic valve 12/22/2016  . Benign prostatic hyperplasia without lower urinary tract symptoms 11/17/2016  . Chronic inflammatory arthritis 11/17/2016  . Chronic bilateral low back pain 11/17/2016  . Heart murmur 11/17/2016  . History of kidney stones 11/17/2016  . Migraine 11/17/2016  . Severe single current episode of major depressive disorder, without psychotic features ([place]) 11/17/2016  . Compartment syndrome ([place]) 06/20/2013  . Enthesopathy of ankle and tarsus 04/04/2013  . FHx: migraine headaches 04/04/2013  . Neck pain 04/04/2013  . Lesion of plantar nerve 04/04/2013  . Personal history of tobacco use, presenting hazards to health 04/04/2013  . Status post bariatric surgery 04/04/2013  . Strain of rotator cuff 04/04/2013  . Chronic back pain 04/03/2013  . Glaucoma 04/03/2013  . Morbid obesity ([place]) 10/27/2011    Past Medical History: Past Medical History:  Diagnosis Date  . BPH (benign prostatic  hyperplasia)   . Depression   . Diabetes mellitus   . Glaucoma   . Hypertension   . Presence of permanent cardiac pacemaker     Past Surgical History: Past Surgical History:  Procedure Laterality Date  . ANTERIOR CERVICAL DECOMP/DISCECTOMY FUSION N/A 11/17/2018   Procedure: CERVICAL THREE-CERVICAL FOUR, CERVICAL FOUR-CERVICAL FIVE, CERVICAL FIVE-CERVICAL SIX ANTERIOR CERVICAL DECOMPRESSION/DISCECTOMY FUSION;  Surgeon: Earnie Larsson, MD;  Location: [facility];  Service: Neurosurgery;  Laterality: N/A;  . BACK SURGERY     x4  . FOOT NEUROMA SURGERY    . GASTRIC BYPASS    . HEMORRHOID SURGERY     over 30 years ago  . HERNIA REPAIR     LIH umb  . NASAL SINUS SURGERY     x2  . NECK SURGERY      Social History: Social History   Tobacco Use  . Smoking status: Never Smoker  . Smokeless tobacco: Never Used  Substance Use Topics  . Alcohol use: Yes    Comment: occ.  . Drug use: No   Additional social history: lives with wife  Please also refer to relevant sections of EMR.  Family History: Family History  Problem Relation Age of Onset  . Cancer Mother        breast  . Heart disease Father     Allergies and Medications: Allergies  Allergen Reactions  . Morphine Itching    Can take with benadry, facial and nose itching    No current facility-administered medications on file prior to encounter.  Current Outpatient Medications on File Prior to Encounter  Medication Sig Dispense Refill  . aspirin 81 MG tablet Take 81 mg by mouth daily.    Marland Kitchen atorvastatin (LIPITOR) 20 MG tablet Take 20 mg by mouth daily at 6 PM.     . Brexpiprazole (REXULTI) 2 MG TABS Take 2 mg by mouth daily.     Marland Kitchen buPROPion (WELLBUTRIN SR) 200 MG 12 hr tablet Take 200 mg by mouth 2 (two) times daily.    . busPIRone (BUSPAR) 10 MG tablet Take 20 mg by mouth 2 (two) times daily.     . calcium citrate (CALCITRATE - DOSED IN MG ELEMENTAL CALCIUM) 950 MG tablet Take 200 mg of elemental calcium by mouth daily.     . clonazePAM (KLONOPIN) 1 MG tablet Take 1 mg by mouth 2 (two) times daily as needed for anxiety.     . DULoxetine (CYMBALTA) 60 MG capsule Take 60 mg by mouth 2 (two) times daily.     . fexofenadine (ALLEGRA) 180 MG tablet Take 180 mg by mouth daily.    . finasteride (PROSCAR) 5 MG tablet Take 5 mg by mouth daily.    . fluticasone (FLONASE) 50 MCG/ACT nasal spray Place 2 sprays into the nose daily.    . folic acid (FOLVITE) 1 MG tablet Take 1 mg by mouth daily.    . furosemide (LASIX) 40 MG tablet Take 40 mg by mouth daily as needed for fluid.     Marland Kitchen levothyroxine (SYNTHROID) 125 MCG tablet Take 125 mcg by mouth daily.     . Magnesium 500 MG CAPS Take 500 mg by mouth 2 (two) times a day.     . metFORMIN (GLUCOPHAGE) 1000 MG tablet Take 1,000 mg by mouth 2 (two) times daily with a meal.    . mirtazapine (REMERON) 30 MG tablet Take 60 mg by mouth at bedtime.    . Multiple Vitamin (MULTIVITAMIN WITH MINERALS) TABS tablet Take 1 tablet by mouth daily.    Marland Kitchen omeprazole (PRILOSEC) 20 MG capsule Take 20 mg by mouth 2 (two) times daily before a meal.     . oxyCODONE-acetaminophen (PERCOCET) 10-325 MG tablet Take 1 tablet by mouth every 4 (four) hours as needed for pain. 40 tablet 0  . potassium chloride SA (K-DUR) 20 MEQ tablet Take 20 mEq by mouth as needed (when taking furosemide).    . pramipexole (MIRAPEX) 1 MG tablet Take 3 mg by mouth at bedtime.     . Semaglutide, 1 MG/DOSE, (OZEMPIC, 1 MG/DOSE,) 2 MG/1.5ML SOPN Inject 1 mg into the skin every Monday.    . silodosin (RAPAFLO) 8 MG CAPS capsule Take 8 mg by mouth daily with breakfast.    . SUMAtriptan (IMITREX) 6 MG/0.5ML SOLN injection Inject 6 mg into the skin every 2 (two) hours as needed for migraine or headache. May repeat in 2 hours if headache persists or recurs.    Marland Kitchen tiZANidine (ZANAFLEX) 4 MG capsule Take 1 capsule (4 mg total) by mouth 3 (three) times daily as needed for muscle spasms. (Patient taking differently: Take 4 mg by mouth 3  (three) times daily. ) 30 capsule 1    Objective: BP (!) 156/100 (BP Location: Left Arm)   Pulse 93   Temp 98.4 F (36.9 C) (Oral)   Resp 17   SpO2 99%  Exam: Physical Exam Constitutional:      General: He is not in acute distress.    Appearance: He is diaphoretic.  Comments: Lying in bed comfortably with head tilted to the right (due to neck surgery).  Responsive and polite but permitted his wife to do majority of the talking.  HENT:     Head: Normocephalic and atraumatic.     Nose: No congestion.     Mouth/Throat:     Mouth: Mucous membranes are moist.     Pharynx: Oropharynx is clear.  Eyes:     Extraocular Movements: Extraocular movements intact.     Conjunctiva/sclera: Conjunctivae normal.     Pupils: Pupils are equal, round, and reactive to light.  Cardiovascular:     Rate and Rhythm: Normal rate and regular rhythm.     Pulses: Normal pulses.     Heart sounds: Murmur (3/6 systolic musical crescendo) present.  Pulmonary:     Effort: Pulmonary effort is normal. No respiratory distress.     Breath sounds: Normal breath sounds. No rhonchi or rales.  Abdominal:     General: Bowel sounds are normal. There is no distension.     Palpations: Abdomen is soft.  Musculoskeletal:        General: Deformity present.     Right lower leg: Edema present.     Comments: Chronic deformity of the left foot with his distal foot drifting away from midline.  Lower extremity swelling noted on the right side.  Mildly erythematous above the ankle.  Nontender to palpation.  Wearing a compression sock on the left leg but not the right leg.  Skin:    General: Skin is warm.     Capillary Refill: Capillary refill takes less than 2 seconds.  Neurological:     General: No focal deficit present.     Mental Status: He is alert and oriented to person, place, and time. Mental status is at baseline.     Cranial Nerves: No cranial nerve deficit.  Psychiatric:        Mood and Affect: Mood normal.         Behavior: Behavior normal.     Labs and Imaging: CBC BMET  Recent Labs  Lab 02/14/19 2028  WBC 6.2  HGB 10.0*  HCT 34.4*  PLT 245   Recent Labs  Lab 02/14/19 2028  NA 138  K 4.1  CL 106  CO2 23  BUN 12  CREATININE 1.00  GLUCOSE 250*  CALCIUM 9.1     Ct Angio Chest Pe W And/or Wo Contrast  Result Date: 02/14/2019 CLINICAL DATA:  Syncopal episodes.  Hypotension. EXAM: CT ANGIOGRAPHY CHEST WITH CONTRAST TECHNIQUE: Multidetector CT imaging of the chest was performed using the standard protocol during bolus administration of intravenous contrast. Multiplanar CT image reconstructions and MIPs were obtained to evaluate the vascular anatomy. CONTRAST:  70mL OMNIPAQUE IOHEXOL 350 MG/ML SOLN COMPARISON:  None. FINDINGS: Cardiovascular: The ascending thoracic aorta is ectatic measuring approximately 4.4 cm in diameter. Heart size is mildly enlarged. The patient appears to be status post prior aortic valve replacement. An ICD is noted. Mild aortic calcifications are noted Mediastinum/Nodes: No enlarged mediastinal, hilar, or axillary lymph nodes. Thyroid gland, trachea, and esophagus demonstrate no significant findings. Lungs/Pleura: There are few stable peripheral nodules in the right middle lobe. The lungs are otherwise clear. There is no pneumothorax. No large pleural effusion. The trachea is unremarkable. Upper Abdomen: The patient appears to be status post gastric bypass. There is a small fat containing lesion adjacent to the posterior right hepatic lobe, of doubtful clinical significance. Musculoskeletal: No chest wall abnormality. No acute or significant  osseous findings. Review of the MIP images confirms the above findings. IMPRESSION: 1. No PE. 2. The lungs are clear. 3. Ectatic ascending thoracic aorta measuring approximately 4.4 cm in diameter. Follow-up is recommended. Recommend annual imaging followup by CTA or MRA. This recommendation follows 2010  ACCF/AHA/AATS/ACR/ASA/SCA/SCAI/SIR/STS/SVM Guidelines for the Diagnosis and Management of Patients with Thoracic Aortic Disease. Circulation. 2010; 121: G283-M629. Aortic aneurysm NOS (ICD10-I71.9) Aortic Atherosclerosis (ICD10-I70.0). Electronically Signed   By: Constance Holster M.D.   On: 02/14/2019 23:09   Dg Chest Portable 1 View  Result Date: 02/14/2019 CLINICAL DATA:  Weakness EXAM: PORTABLE CHEST 1 VIEW COMPARISON:  02/12/2019, 09/22/2018 FINDINGS: Post sternotomy changes. Left-sided pacing device as before. Valvular prosthesis. No acute airspace disease or effusion. Stable cardiomediastinal silhouette. No pneumothorax. IMPRESSION: No active disease. Electronically Signed   By: Donavan Foil M.D.   On: 02/14/2019 21:50     Matilde Haymaker, MD 02/15/2019, 3:49 AM PGY-1, [place] Intern pager: [phone], text pages welcome     FPTS Upper-Level Resident Addendum   I have independently interviewed and examined the patient. I have discussed the above with the original author and agree with their documentation. My edits for correction/addition/clarification are in pink. Please see also any attending notes.    Lucila Maine, DO PGY-3, [place] Service pager: [phone] (text pages welcome through [place])

## 2019-02-15 NOTE — Progress Notes (Signed)
Progress Note  Patient Name: Antonio Oliver Date of Encounter: 02/15/2019  Primary Cardiologist:  Jenean Lindau, MD  Subjective   The patient feels significantly better today, BP is up.  Inpatient Medications    Scheduled Meds: . aspirin  81 mg Oral Daily  . atorvastatin  20 mg Oral q1800  . Brexpiprazole  2 mg Oral Daily  . buPROPion  200 mg Oral BID  . busPIRone  20 mg Oral BID  . calcium citrate  200 mg of elemental calcium Oral Daily  . DULoxetine  60 mg Oral BID  . enoxaparin (LOVENOX) injection  40 mg Subcutaneous Q24H  . fluticasone  2 spray Each Nare Daily  . folic acid  1 mg Oral Daily  . insulin aspart  0-9 Units Subcutaneous TID WC  . levothyroxine  125 mcg Oral Q0600  . loratadine  10 mg Oral Daily  . magnesium oxide  400 mg Oral BID  . mirtazapine  60 mg Oral QHS  . multivitamin with minerals  1 tablet Oral Daily  . pantoprazole  80 mg Oral Daily  . pramipexole  3 mg Oral QHS   Continuous Infusions:  PRN Meds: acetaminophen **OR** acetaminophen, ondansetron **OR** ondansetron (ZOFRAN) IV, oxyCODONE-acetaminophen **AND** oxyCODONE, polyethylene glycol, tiZANidine   Vital Signs    Vitals:   02/15/19 0132 02/15/19 0625 02/15/19 0734 02/15/19 0933  BP: (!) 156/100 (!) 150/98 (!) 154/86 (!) 148/91  Pulse: 93 90 85 75  Resp: 17 18 18    Temp: 98.4 F (36.9 C) 98.8 F (37.1 C) 98.6 F (37 C)   TempSrc: Oral Oral Oral   SpO2: 99% 99% 98% 98%    Intake/Output Summary (Last 24 hours) at 02/15/2019 1120 Last data filed at 02/15/2019 0941 Gross per 24 hour  Intake 480 ml  Output 1475 ml  Net -995 ml   There were no vitals filed for this visit.  Weight change:    Telemetry    SR - Personally Reviewed  ECG    06/11, SR, borderline LVH, HR 80 - Personally Reviewed  Physical Exam   General: Well developed, well nourished, male appearing in no acute distress. Head: Normocephalic, atraumatic.  Neck: Supple without bruits, no JVD. Lungs:   Resp regular and unlabored, CTA. Heart: RRR, S1, S2, no S3, S4, or murmur; no rub. Abdomen: Soft, non-tender, non-distended with normoactive bowel sounds. No hepatomegaly. No rebound/guarding. No obvious abdominal masses. Extremities: No clubbing, cyanosis, minimal around ankles, ortho boot on the left leg Distal pedal pulses are 2+ bilaterally. Neuro: Alert and oriented X 3. Moves all extremities spontaneously. Psych: Normal affect.  Labs    Hematology Recent Labs  Lab 02/14/19 2028 02/15/19 0531  WBC 6.2 5.4  RBC 4.29 4.04*  HGB 10.0* 9.5*  HCT 34.4* 31.4*  MCV 80.2 77.7*  MCH 23.3* 23.5*  MCHC 29.1* 30.3  RDW 14.9 14.9  PLT 245 237    Chemistry Recent Labs  Lab 02/14/19 2028 02/14/19 2140 02/15/19 0531  NA 138  --  141  K 4.1  --  4.2  CL 106  --  108  CO2 23  --  25  GLUCOSE 250*  --  109*  BUN 12  --  11  CREATININE 1.00  --  0.93  CALCIUM 9.1  --  8.9  PROT  --  5.7*  --   ALBUMIN  --  3.5  --   AST  --  22  --   ALT  --  25  --   ALKPHOS  --  103  --   BILITOT  --  0.4  --   GFRNONAA >60  --  >60  GFRAA >60  --  >60  ANIONGAP 9  --  8     Cardiac Enzymes Recent Labs  Lab 02/14/19 2140 02/15/19 0531  TROPONINI <0.03 <0.03   No results for input(s): TROPIPOC in the last 168 hours.   BNP Recent Labs  Lab 02/14/19 2140  BNP 43.7     DDimer  Recent Labs  Lab 02/14/19 2140  DDIMER 0.94*     Radiology    Ct Angio Chest Pe W And/or Wo Contrast  Result Date: 02/14/2019 CLINICAL DATA:  Syncopal episodes.  Hypotension. EXAM: CT ANGIOGRAPHY CHEST WITH CONTRAST TECHNIQUE: Multidetector CT imaging of the chest was performed using the standard protocol during bolus administration of intravenous contrast. Multiplanar CT image reconstructions and MIPs were obtained to evaluate the vascular anatomy. CONTRAST:  62mL OMNIPAQUE IOHEXOL 350 MG/ML SOLN COMPARISON:  None. FINDINGS: Cardiovascular: The ascending thoracic aorta is ectatic measuring  approximately 4.4 cm in diameter. Heart size is mildly enlarged. The patient appears to be status post prior aortic valve replacement. An ICD is noted. Mild aortic calcifications are noted Mediastinum/Nodes: No enlarged mediastinal, hilar, or axillary lymph nodes. Thyroid gland, trachea, and esophagus demonstrate no significant findings. Lungs/Pleura: There are few stable peripheral nodules in the right middle lobe. The lungs are otherwise clear. There is no pneumothorax. No large pleural effusion. The trachea is unremarkable. Upper Abdomen: The patient appears to be status post gastric bypass. There is a small fat containing lesion adjacent to the posterior right hepatic lobe, of doubtful clinical significance. Musculoskeletal: No chest wall abnormality. No acute or significant osseous findings. Review of the MIP images confirms the above findings. IMPRESSION: 1. No PE. 2. The lungs are clear. 3. Ectatic ascending thoracic aorta measuring approximately 4.4 cm in diameter. Follow-up is recommended. Recommend annual imaging followup by CTA or MRA. This recommendation follows 2010 ACCF/AHA/AATS/ACR/ASA/SCA/SCAI/SIR/STS/SVM Guidelines for the Diagnosis and Management of Patients with Thoracic Aortic Disease. Circulation. 2010; 121: E423-N361. Aortic aneurysm NOS (ICD10-I71.9) Aortic Atherosclerosis (ICD10-I70.0). Electronically Signed   By: Constance Holster M.D.   On: 02/14/2019 23:09   Dg Chest Portable 1 View  Result Date: 02/14/2019 CLINICAL DATA:  Weakness EXAM: PORTABLE CHEST 1 VIEW COMPARISON:  02/12/2019, 09/22/2018 FINDINGS: Post sternotomy changes. Left-sided pacing device as before. Valvular prosthesis. No acute airspace disease or effusion. Stable cardiomediastinal silhouette. No pneumothorax. IMPRESSION: No active disease. Electronically Signed   By: Donavan Foil M.D.   On: 02/14/2019 21:50     Cardiac Studies   ECHO:  02/15/2019  1. The left ventricle has hyperdynamic systolic function, with  an ejection fraction of >65%. The cavity size was normal. There is mildly increased left ventricular wall thickness. Left ventricular diastolic Doppler parameters are consistent with  pseudonormalization. No evidence of left ventricular regional wall motion abnormalities.  2. The right ventricle has normal systolic function. The cavity was normal. There is no increase in right ventricular wall thickness.  3. Moderate thickening of the mitral valve leaflet. Mitral valve regurgitation is mild to moderate by color flow Doppler. The MR jet is posteriorly-directed.  4. A bioprosthesis valve is present in the aortic position. Normal aortic valve prosthesis.  5. Bioprosthetic aortic valve - peak velocity is 2.69m/s and mean gradient is 79mmHg.  Patient Profile     63 y.o. male w/ hx bicuspid AoV s/p  AVR w/ bioprosthesis; heart block s/p PPM placement. H/o LHC in 2018 showing normal cors, and last TTE in 2018 showed EF 65%, hx chronic LE edema and problems w/ orthostatic hypotension. Pt admitted 06/10 with labile BP and dizzy spells, felt related to BP meds.  Assessment & Plan     Active Problems:   Syncope  1. Labile BP/dizzy spells, orthostatic hypotension: all BP meds have been stopped. Pt has no h/o HF; last EF was 65%. His bioprosthetic valve is functioning normally with normal transaortic gradients.  TSH is normal. No arrhythmias or pauses on telemetry. The patient used to be very active until this January when he broke his leg and has been very sedentary ever since, his episodes are after prolonged sitting, I have discussed importance of good hydration and ankle pumps before getting up. We will arrange for a follow up in 1 week, if he continues to have symptoms, midodrine can be considered.   2. Edema: longstanding and felt to be related to venous insufficiency and other multifactorial causes. Ankle pumps and walking are recommended  3. DM2/dyslipidemia/HTN: mgmt as per primary medical team   [facility] will sign off.   Medication Recommendations:  As above, the patient can be discharged today Other recommendations (labs, testing, etc):  No further testing Follow up as an outpatient:  We will arrange   Signed, Ena Dawley, MD 02/15/2019 Pager: [phone]

## 2019-02-15 NOTE — Discharge Instructions (Signed)
Stop taking Metoprolol, Lasix, and Lisinopril.  We also recommend that you discuss your dose of Mirapex with your doctor, considering weaning your Cymbalta, and discontinuing your Zanaflex.  We feel that these may be contributing to your episodes of passing out.   To wean your Cymbalta, take 30mg  BID x2 weeks, then 30mg  QD x2 weeks, then stop.  If you gain more than 3 lbs in one day, or 5 lbs in one week, you should be seen by your cardiologist.   You should pump your ankles before standing and wear support stockings.    If you start to experience dizziness, you have chest pain, you pass out, or you feel like you are going to pass out, you should be seen right away.  Hypotension As your heart beats, it forces blood through your body. This force is called blood pressure. If you have hypotension, you have low blood pressure. When your blood pressure is too low, you may not get enough blood to your brain or other parts of your body. This may cause you to feel weak, light-headed, have a fast heartbeat, or even pass out (faint). Low blood pressure may be harmless, or it may cause serious problems. What are the causes?  Blood loss.  Not enough water in the body (dehydration).  Heart problems.  Hormone problems.  Pregnancy.  A very bad infection.  Not having enough of certain nutrients.  Very bad allergic reactions.  Certain medicines. What increases the risk?  Age. The risk increases as you get older.  Conditions that affect the heart or the brain and spinal cord (central nervous system).  Taking certain medicines.  Being pregnant. What are the signs or symptoms?  Feeling: ? Weak. ? Light-headed. ? Dizzy. ? Tired (fatigued).  Blurred vision.  Fast heartbeat.  Passing out, in very bad cases. How is this treated?  Changing your diet. This may involve eating more salt (sodium) or drinking more water.  Taking medicines to raise your blood pressure.  Changing how much  you take (the dosage) of some of your medicines.  Wearing compression stockings. These stockings help to prevent blood clots and reduce swelling in your legs. In some cases, you may need to go to the hospital for:  Fluid replacement. This means you will receive fluids through an IV tube.  Blood replacement. This means you will receive donated blood through an IV tube (transfusion).  Treating an infection or heart problems, if this applies.  Monitoring. You may need to be monitored while medicines that you are taking wear off. Follow these instructions at home: Eating and drinking   Drink enough fluids to keep your pee (urine) pale yellow.  Eat a healthy diet. Follow instructions from your doctor about what you can eat or drink. A healthy diet includes: ? Fresh fruits and vegetables. ? Whole grains. ? Low-fat (lean) meats. ? Low-fat dairy products.  Eat extra salt only as told. Do not add extra salt to your diet unless your doctor tells you to.  Eat small meals often.  Avoid standing up quickly after you eat. Medicines  Take over-the-counter and prescription medicines only as told by your doctor. ? Follow instructions from your doctor about changing how much you take of your medicines, if this applies. ? Do not stop or change any of your medicines on your own. General instructions   Wear compression stockings as told by your doctor.  Get up slowly from lying down or sitting.  Avoid hot showers and  a lot of heat as told by your doctor.  Return to your normal activities as told by your doctor. Ask what activities are safe for you.  Do not use any products that contain nicotine or tobacco, such as cigarettes, e-cigarettes, and chewing tobacco. If you need help quitting, ask your doctor.  Keep all follow-up visits as told by your doctor. This is important. Contact a doctor if:  You throw up (vomit).  You have watery poop (diarrhea).  You have a fever for more than 2-3  days.  You feel more thirsty than normal.  You feel weak and tired. Get help right away if:  You have chest pain.  You have a fast or uneven heartbeat.  You lose feeling (have numbness) in any part of your body.  You cannot move your arms or your legs.  You have trouble talking.  You get sweaty or feel light-headed.  You pass out.  You have trouble breathing.  You have trouble staying awake.  You feel mixed up (confused). Summary  Hypotension is also called low blood pressure. It is when the force of blood pumping through your arteries is too weak.  Hypotension may be harmless, or it may cause serious problems.  Treatment may include changing your diet and medicines, and wearing compression stockings.  In very bad cases, you may need to go to the hospital. This information is not intended to replace advice given to you by your health care provider. Make sure you discuss any questions you have with your health care provider. Document Released: 11/17/2009 Document Revised: 02/16/2018 Document Reviewed: 02/16/2018 Elsevier Interactive Patient Education  [employer].

## 2019-02-15 NOTE — Progress Notes (Signed)
  Echocardiogram 2D Echocardiogram has been performed.  Antonio Oliver 02/15/2019, 9:18 AM

## 2019-02-15 NOTE — Discharge Summary (Signed)
[facility] Discharge Summary  Patient name: Antonio Oliver Medical record number: [id number] Date of birth: 01-01-1956 Age: 63 y.o. Gender: male Date of Admission: 02/14/2019  Date of Discharge: 02/15/2019 Admitting Physician: Martyn Malay, MD  Primary Care Provider: No primary care provider on file. Consultants: Cardiology  Indication for Hospitalization: Syncope  Discharge Diagnoses/Problem List:  Syncope likely secondary to orthostatic hypotension and polypharmacy Lower extremity edema Normocytic anemia CAD Chronic pain Type 2 diabetes Hypothyroidism MDD Allergies GERD BPH  Disposition: home  Discharge Condition: stable, improved  Discharge Exam:  Physical Exam:  General: 63 y.o. male in NAD Cardio: RRR, systolic murmur Lungs: CTAB, no wheezing, no rhonchi, no crackles, no IWOB on RA Abdomen: Soft, non-tender to palpation, non-distended, positive bowel sounds Skin: warm and dry Extremities: Trace edema of right lower extremity, no erythema noted, walking boot in place on left leg from leg fracture  Brief Hospital Course:  Antonio Oliver a 63 y.o.malepresenting with multiple episodes of near syncope and 1 episode of syncope. His past medical history is significant for aortic stenosis s/p replacement with bio-prosthetic valve (2018), Complete heart block s/p PPM, Cervical myelopathy s/p surgical intervention (11/2018),hypothyroidism, DMT2, MDD, allergies, GERD.  His hospital course is outlined below.  Syncope Patient presented following syncopal episode on 6/8.  Admission details can be found in H&P.  Patient had blood pressure reported on 6/8 of 80s over 40s.  Given the syncopal episode, patient was told to hold his Lasix.  He has been experiencing other episodes like this for the past few months, and during this his lisinopril metoprolol have also been held.  On presentation, patient's blood pressure 155/86.  He remained normotensive  during his hospitalization, orthostatic vital signs were negative.  Patient did not receive any fluid resuscitation while in the hospital.  Cardiology was consulted given patient's extensive cardiac history and echo performed.  Patient's echo showed EF of greater than 65% and his bioprosthetic aortic valve was functioning well.  Per cardiology, no further inpatient work-up was warranted and patient should follow-up as an outpatient in 1 week.  Given history of patient's low blood pressure and improvement with stopping Lasix, metoprolol, lisinopril, suspect that his episodes were likely caused by orthostatic hypotension.  Patient had also reported that his Lasix, metoprolol, lisinopril were started in the last few months.  Given the patient's echo was within normal limits, would not recommend restarting these medications.  Patient also has many psychiatric medications and polypharmacy could be contributing as well.  Patient was receiving opioids as needed as well as many sedating medications.  Per chart review, patient is on an odd dose of Mirapex.  Patient was unsure why he takes Mirapex, but wanted to continue this medication for now.  Also appears that he was recently started on Cymbalta for leg pain, for which he states has improved his leg pain.  This was also recommended that the patient wean given his many other psychiatric medications and risk for serotonin syndrome.  It was was also recommended that patient discontinue Zanaflex given his anticholinergic and sedating effects.  No head imaging was performed as patient remained neurologically intact during his hospitalization and had a CT head in March 2020 for similar complaints that was normal.  At the time of discharge, patient was without complaint, able to walk with PT, and with stable vital signs.    Issues for Follow Up:  1. Patient should follow-up with cardiology in 1 week.  His Lasix, metoprolol,  lisinopril were held on discharge.  No identified  indication for Lasix.  Patient was instructed to monitor weights. 2. Polypharmacy also likely contributed to orthostatic symptoms.  Patient recommended to taper duloxetine.  Also recommend checking dose of pramipexole for restless leg syndrome versus mood.  If for restless leg syndrome would recommend 0.5 mg nightly, if for mood would recommend 1 mg 3 times daily. 3. Patient should have an outpatient colonoscopy given that he has microcytic anemia that appears to be related to iron deficiency anemia.  He should continue iron supplementation. 4. Patient should have follow-up CTA or MRI in 1 year for ectatic aorta 5. Patient advised to ankle pump before getting out of bed.  Significant Procedures: none  Significant Labs and Imaging:  Recent Labs  Lab 02/14/19 2028 02/15/19 0531  WBC 6.2 5.4  HGB 10.0* 9.5*  HCT 34.4* 31.4*  PLT 245 237   Recent Labs  Lab 02/14/19 2028 02/14/19 2140 02/15/19 0531  NA 138  --  141  K 4.1  --  4.2  CL 106  --  108  CO2 23  --  25  GLUCOSE 250*  --  109*  BUN 12  --  11  CREATININE 1.00  --  0.93  CALCIUM 9.1  --  8.9  ALKPHOS  --  103  --   AST  --  22  --   ALT  --  25  --   ALBUMIN  --  3.5  --     Echo  02/15/2019  1. The left ventricle has hyperdynamic systolic function, with an ejection fraction of >65%. The cavity size was normal. There is mildly increased left ventricular wall thickness. Left ventricular diastolic Doppler parameters are consistent with  pseudonormalization. No evidence of left ventricular regional wall motion abnormalities.  2. The right ventricle has normal systolic function. The cavity was normal. There is no increase in right ventricular wall thickness.  3. Moderate thickening of the mitral valve leaflet. Mitral valve regurgitation is mild to moderate by color flow Doppler. The MR jet is posteriorly-directed.  4. A bioprosthesis valve is present in the aortic position. Normal aortic valve prosthesis.  5. Bioprosthetic  aortic valve - peak velocity is 2.26m/s and mean gradient is 66mmHg.  Ct Angio Chest Pe W And/or Wo Contrast  Result Date: 02/14/2019 CLINICAL DATA:  Syncopal episodes.  Hypotension. EXAM: CT ANGIOGRAPHY CHEST WITH CONTRAST TECHNIQUE: Multidetector CT imaging of the chest was performed using the standard protocol during bolus administration of intravenous contrast. Multiplanar CT image reconstructions and MIPs were obtained to evaluate the vascular anatomy. CONTRAST:  26mL OMNIPAQUE IOHEXOL 350 MG/ML SOLN COMPARISON:  None. FINDINGS: Cardiovascular: The ascending thoracic aorta is ectatic measuring approximately 4.4 cm in diameter. Heart size is mildly enlarged. The patient appears to be status post prior aortic valve replacement. An ICD is noted. Mild aortic calcifications are noted Mediastinum/Nodes: No enlarged mediastinal, hilar, or axillary lymph nodes. Thyroid gland, trachea, and esophagus demonstrate no significant findings. Lungs/Pleura: There are few stable peripheral nodules in the right middle lobe. The lungs are otherwise clear. There is no pneumothorax. No large pleural effusion. The trachea is unremarkable. Upper Abdomen: The patient appears to be status post gastric bypass. There is a small fat containing lesion adjacent to the posterior right hepatic lobe, of doubtful clinical significance. Musculoskeletal: No chest wall abnormality. No acute or significant osseous findings. Review of the MIP images confirms the above findings. IMPRESSION: 1. No PE. 2. The lungs  are clear. 3. Ectatic ascending thoracic aorta measuring approximately 4.4 cm in diameter. Follow-up is recommended. Recommend annual imaging followup by CTA or MRA. This recommendation follows 2010 ACCF/AHA/AATS/ACR/ASA/SCA/SCAI/SIR/STS/SVM Guidelines for the Diagnosis and Management of Patients with Thoracic Aortic Disease. Circulation. 2010; 121: N829-F621. Aortic aneurysm NOS (ICD10-I71.9) Aortic Atherosclerosis (ICD10-I70.0).  Electronically Signed   By: Constance Holster M.D.   On: 02/14/2019 23:09   Dg Chest Portable 1 View  Result Date: 02/14/2019 CLINICAL DATA:  Weakness EXAM: PORTABLE CHEST 1 VIEW COMPARISON:  02/12/2019, 09/22/2018 FINDINGS: Post sternotomy changes. Left-sided pacing device as before. Valvular prosthesis. No acute airspace disease or effusion. Stable cardiomediastinal silhouette. No pneumothorax. IMPRESSION: No active disease. Electronically Signed   By: Donavan Foil M.D.   On: 02/14/2019 21:50   Results/Tests Pending at Time of Discharge: None  Discharge Medications:  Allergies as of 02/15/2019      Reactions   Morphine Itching   Can take with benadry, facial and nose itching      Medication List    STOP taking these medications   clonazePAM 1 MG tablet Commonly known as: KLONOPIN   furosemide 40 MG tablet Commonly known as: LASIX   oxyCODONE-acetaminophen 10-325 MG tablet Commonly known as: Percocet   tiZANidine 4 MG capsule Commonly known as: ZANAFLEX     TAKE these medications   aspirin 81 MG tablet Take 81 mg by mouth daily. Notes to patient: Tomorrow    atorvastatin 20 MG tablet Commonly known as: LIPITOR Take 20 mg by mouth daily at 6 PM. Notes to patient: This evening   buPROPion 200 MG 12 hr tablet Commonly known as: WELLBUTRIN SR Take 200 mg by mouth 2 (two) times daily. Notes to patient: This evening   busPIRone 10 MG tablet Commonly known as: BUSPAR Take 20 mg by mouth 2 (two) times daily. Notes to patient: This evening   calcium citrate 950 MG tablet Commonly known as: CALCITRATE - dosed in mg elemental calcium Take 200 mg of elemental calcium by mouth daily. Notes to patient: Tomorrow    DULoxetine 60 MG capsule Commonly known as: CYMBALTA Take 60 mg by mouth 2 (two) times daily. Notes to patient: This evening   ferrous sulfate 325 (65 FE) MG tablet Take 1 tablet (325 mg total) by mouth every other day. Notes to patient: See special  instructions above    fexofenadine 180 MG tablet Commonly known as: ALLEGRA Take 180 mg by mouth daily. Notes to patient: Tomorrow    finasteride 5 MG tablet Commonly known as: PROSCAR Take 5 mg by mouth daily. Notes to patient: Tomorrow    fluticasone 50 MCG/ACT nasal spray Commonly known as: FLONASE Place 2 sprays into the nose daily. Notes to patient: Tomorrow    folic acid 1 MG tablet Commonly known as: FOLVITE Take 1 mg by mouth daily. Notes to patient: Tomorrow    levothyroxine 125 MCG tablet Commonly known as: SYNTHROID Take 125 mcg by mouth daily. Notes to patient: Tomorrow    Magnesium 500 MG Caps Take 500 mg by mouth 2 (two) times a day. Notes to patient: This evening   metFORMIN 1000 MG tablet Commonly known as: GLUCOPHAGE Take 1,000 mg by mouth 2 (two) times daily with a meal. Notes to patient: This evening   mirtazapine 30 MG tablet Commonly known as: REMERON Take 60 mg by mouth at bedtime. Notes to patient: This evening   multivitamin with minerals Tabs tablet Take 1 tablet by mouth daily. Notes to patient: Tomorrow  omeprazole 20 MG capsule Commonly known as: PRILOSEC Take 20 mg by mouth 2 (two) times daily before a meal. Notes to patient: This evening   Ozempic (1 MG/DOSE) 2 MG/1.5ML Sopn Generic drug: Semaglutide (1 MG/DOSE) Inject 1 mg into the skin every Monday. Notes to patient: See special instructions   potassium chloride SA 20 MEQ tablet Commonly known as: K-DUR Take 20 mEq by mouth as needed (when taking furosemide).   pramipexole 1 MG tablet Commonly known as: MIRAPEX Take 3 mg by mouth at bedtime. Notes to patient: This evening   Rapaflo 8 MG Caps capsule Generic drug: silodosin Take 8 mg by mouth daily with breakfast. Notes to patient: Tomorrow    Rexulti 2 MG Tabs Generic drug: Brexpiprazole Take 2 mg by mouth daily. Notes to patient: Tomorrow    SUMAtriptan 6 MG/0.5ML Soln injection Commonly known as:  IMITREX Inject 6 mg into the skin every 2 (two) hours as needed for migraine or headache. May repeat in 2 hours if headache persists or recurs.       Discharge Instructions: Please refer to Patient Instructions section of EMR for full details.  Patient was counseled important signs and symptoms that should prompt return to medical care, changes in medications, dietary instructions, activity restrictions, and follow up appointments.   Follow-Up Appointments: Follow-up Information    Revankar, Reita Cliche, MD Follow up on 03/06/2019.   Specialty: Cardiology Why: Virtual office visit at 9:20 am Contact information: [place] [address] [phone]        Cher Nakai, MD. Schedule an appointment as soon as possible for a visit in 1 week(s).   Specialty: Internal Medicine Contact information: [address] [place] [address] [phone]           Cleophas Dunker, DO 02/15/2019, 3:46 PM PGY-1, [place]

## 2019-02-23 ENCOUNTER — Other Ambulatory Visit: Payer: Medicare Other

## 2019-02-26 ENCOUNTER — Encounter: Payer: Self-pay | Admitting: *Deleted

## 2019-02-26 DIAGNOSIS — S62133A Displaced fracture of capitate [os magnum] bone, unspecified wrist, initial encounter for closed fracture: Secondary | ICD-10-CM

## 2019-02-26 HISTORY — DX: Displaced fracture of capitate (os magnum) bone, unspecified wrist, initial encounter for closed fracture: S62.133A

## 2019-03-05 DIAGNOSIS — S82209A Unspecified fracture of shaft of unspecified tibia, initial encounter for closed fracture: Secondary | ICD-10-CM | POA: Insufficient documentation

## 2019-03-05 DIAGNOSIS — M14679 Charcot's joint, unspecified ankle and foot: Secondary | ICD-10-CM | POA: Insufficient documentation

## 2019-03-05 HISTORY — DX: Charcot's joint, unspecified ankle and foot: M14.679

## 2019-03-05 HISTORY — DX: Unspecified fracture of shaft of unspecified tibia, initial encounter for closed fracture: S82.209A

## 2019-03-06 ENCOUNTER — Telehealth (INDEPENDENT_AMBULATORY_CARE_PROVIDER_SITE_OTHER): Payer: Medicare Other | Admitting: Cardiology

## 2019-03-06 ENCOUNTER — Other Ambulatory Visit: Payer: Self-pay

## 2019-03-06 ENCOUNTER — Encounter: Payer: Self-pay | Admitting: Cardiology

## 2019-03-06 VITALS — BP 140/84 | HR 96 | Ht 75.0 in | Wt 195.0 lb

## 2019-03-06 DIAGNOSIS — E119 Type 2 diabetes mellitus without complications: Secondary | ICD-10-CM | POA: Diagnosis not present

## 2019-03-06 DIAGNOSIS — Z952 Presence of prosthetic heart valve: Secondary | ICD-10-CM | POA: Diagnosis not present

## 2019-03-06 DIAGNOSIS — I10 Essential (primary) hypertension: Secondary | ICD-10-CM

## 2019-03-06 DIAGNOSIS — I77810 Thoracic aortic ectasia: Secondary | ICD-10-CM

## 2019-03-06 HISTORY — DX: Thoracic aortic ectasia: I77.810

## 2019-03-06 NOTE — Progress Notes (Signed)
Virtual Visit via Video Note   This visit type was conducted due to national recommendations for restrictions regarding the COVID-19 Pandemic (e.g. social distancing) in an effort to limit this patient's exposure and mitigate transmission in our community.  Due to his co-morbid illnesses, this patient is at least at moderate risk for complications without adequate follow up.  This format is felt to be most appropriate for this patient at this time.  All issues noted in this document were discussed and addressed.  A limited physical exam was performed with this format.  Please refer to the patient's chart for his consent to telehealth for [facility].   Date:  03/06/2019   ID:  Antonio Oliver, DOB 1956-03-31, MRN [id number]  Patient Location: Home Provider Location: Home  PCP:  Cher Nakai, MD  Cardiologist:  Jenean Lindau, MD  Electrophysiologist:  Constance Haw, MD   Evaluation Performed:  Follow-Up Visit  Chief Complaint: Follow-up visit  History of Present Illness:    Antonio Oliver is a 63 y.o. male with past medical history of essential hypertension, diabetes mellitus recent CT scan is revealed dilated ascending aorta.  Patient was in the hospital with hypotensive episodes and his blood pressure medications were held.  Subsequently he is done fine.  No chest pain orthopnea or PND.  He tells me that he feels much better and stronger and is due to see his primary care physician in the next few days.  At the time of my evaluation, the patient is alert awake oriented and in no distress.     ECHO IMPRESSIONS    1. The left ventricle has hyperdynamic systolic function, with an ejection fraction of >65%. The cavity size was normal. There is mildly increased left ventricular wall thickness. Left ventricular diastolic Doppler parameters are consistent with  pseudonormalization. No evidence of left ventricular regional wall motion abnormalities.  2. The right ventricle has  normal systolic function. The cavity was normal. There is no increase in right ventricular wall thickness.  3. Moderate thickening of the mitral valve leaflet. Mitral valve regurgitation is mild to moderate by color flow Doppler. The MR jet is posteriorly-directed.  4. A bioprosthesis valve is present in the aortic position. Normal aortic valve prosthesis.  5. Bioprosthetic aortic valve - peak velocity is 2.55m/s and mean gradient is 36mmHg.   IMPRESSION: 1. No PE. 2. The lungs are clear. 3. Ectatic ascending thoracic aorta measuring approximately 4.4 cm in diameter. Follow-up is recommended. Recommend annual imaging followup by CTA or MRA. This recommendation follows 2010 ACCF/AHA/AATS/ACR/ASA/SCA/SCAI/SIR/STS/SVM Guidelines for the Diagnosis and Management of Patients with Thoracic Aortic Disease. Circulation. 2010; 121: J009-F818. Aortic aneurysm NOS (ICD10-I71.9)  Aortic Atherosclerosis (ICD10-I70.0).   Electronically Signed   By: Constance Holster M.D.   On: 02/14/2019 23:09    The patient does not have symptoms concerning for COVID-19 infection (fever, chills, cough, or new shortness of breath).    Past Medical History:  Diagnosis Date  . Anxiety 10/30/2018  . Bicuspid aortic valve 12/22/2016  . BPH (benign prostatic hyperplasia)   . Bradycardia 02/01/2019  . Cervical myelopathy ([place]) 11/14/2018  . Chronic back pain 04/03/2013  . Chronic bilateral low back pain 11/17/2016  . Chronic inflammatory arthritis 11/17/2016   History of positive rheumatoid factor in the past. Denies placement on immunosuppressive therapy  . Compartment syndrome ([place]) 06/20/2013   Last Assessment & Plan:  Denies any problems with right leg since April/May Does have swelling at times if  he is on this leg for long periods of time. Wears compression socks regularly and has helped with swelling.  . Complete heart block ([place]) 05/04/2017  . Depression   . Diabetes ([place]) 10/30/2018  . Diabetes mellitus    . Diabetes mellitus due to underlying condition with unspecified complications ([place]) 04/16/9146  . Diabetes mellitus type 2 in nonobese (H[facility])   . Enthesopathy of ankle and tarsus 04/04/2013   Last Assessment & Plan:  Continues with swelling after prolonged standing.  Still  Wearing support hose, which helps.  . Essential hypertension 10/30/2018  . Fatigue 10/30/2018  . FHx: migraine headaches 04/04/2013  . Fracture of capitate bone of wrist 02/26/2019  . GERD (gastroesophageal reflux disease) 10/30/2018  . Glaucoma   . Glaucoma 04/03/2013  . Heart murmur 11/17/2016  . Hemangioma of skin and subcutaneous tissue 07/07/2017  . History of kidney stones 11/17/2016  . Hyperlipidemia 10/30/2018  . Hypertension   . Hypothyroidism 10/30/2018  . Lentigo 07/07/2017  . Lesion of plantar nerve 04/04/2013  . Leukocytosis   . Migraine 11/17/2016  . Mixed dyslipidemia 10/30/2018  . Morbid obesity ([place]) 10/27/2011  . Multiple actinic keratoses 07/07/2017  . Multiple benign melanocytic nevi 07/07/2017  . Neck pain 04/04/2013  . Neoplasm of uncertain behavior of skin 07/28/2018  . Nondisplaced fracture of medial cuneiform of left foot, initial encounter for closed fracture 11/16/2018  . Nonrheumatic aortic valve stenosis 02/01/2019  . Osteoarthritis 10/30/2018  . Other seborrheic keratosis 07/07/2017  . Personal history of tobacco use, presenting hazards to health 04/04/2013  . Presence of permanent cardiac pacemaker   . S/P AVR (aortic valve replacement) 03/10/2017  . Severe single current episode of major depressive disorder, without psychotic features ([place]) 11/17/2016  . Status post bariatric surgery 04/04/2013  . Strain of rotator cuff 04/04/2013  . Surgery, elective   . Syncope 02/14/2019  . Vitamin D deficiency 10/30/2018  . Weakness    Past Surgical History:  Procedure Laterality Date  . ANTERIOR CERVICAL DECOMP/DISCECTOMY FUSION N/A 11/17/2018   Procedure: CERVICAL THREE-CERVICAL FOUR, CERVICAL FOUR-CERVICAL FIVE,  CERVICAL FIVE-CERVICAL SIX ANTERIOR CERVICAL DECOMPRESSION/DISCECTOMY FUSION;  Surgeon: Earnie Larsson, MD;  Location: [facility];  Service: Neurosurgery;  Laterality: N/A;  . BACK SURGERY     x4  . FOOT NEUROMA SURGERY    . GASTRIC BYPASS    . HEMORRHOID SURGERY     over 30 years ago  . HERNIA REPAIR     LIH umb  . NASAL SINUS SURGERY     x2  . NECK SURGERY       Current Meds  Medication Sig  . aspirin 81 MG tablet Take 81 mg by mouth daily.  Marland Kitchen atorvastatin (LIPITOR) 20 MG tablet Take 20 mg by mouth daily at 6 PM.   . Brexpiprazole (REXULTI) 2 MG TABS Take 2 mg by mouth daily.   Marland Kitchen buPROPion (WELLBUTRIN SR) 200 MG 12 hr tablet Take 200 mg by mouth 2 (two) times daily.  . busPIRone (BUSPAR) 10 MG tablet Take 20 mg by mouth 2 (two) times daily.   . calcium citrate (CALCITRATE - DOSED IN MG ELEMENTAL CALCIUM) 950 MG tablet Take 200 mg of elemental calcium by mouth daily.  . DULoxetine (CYMBALTA) 60 MG capsule Take 60 mg by mouth 2 (two) times daily.   . ferrous sulfate 325 (65 FE) MG tablet Take 1 tablet (325 mg total) by mouth every other day.  . fexofenadine (ALLEGRA) 180 MG tablet Take 180 mg by mouth  daily.  . finasteride (PROSCAR) 5 MG tablet Take 5 mg by mouth daily.  . fluticasone (FLONASE) 50 MCG/ACT nasal spray Place 2 sprays into the nose daily.  . folic acid (FOLVITE) 1 MG tablet Take 1 mg by mouth daily.  . furosemide (LASIX) 20 MG tablet Take 20 mg by mouth as needed.  Marland Kitchen levothyroxine (SYNTHROID) 125 MCG tablet Take 125 mcg by mouth daily.   . Magnesium 500 MG CAPS Take 500 mg by mouth 2 (two) times a day.   . metFORMIN (GLUCOPHAGE) 1000 MG tablet Take 1,000 mg by mouth 2 (two) times daily with a meal.  . mirtazapine (REMERON) 30 MG tablet Take 60 mg by mouth at bedtime.  . Multiple Vitamin (MULTIVITAMIN WITH MINERALS) TABS tablet Take 1 tablet by mouth daily.  Marland Kitchen omeprazole (PRILOSEC) 20 MG capsule Take 20 mg by mouth 2 (two) times daily before a meal.   . potassium chloride SA  (K-DUR) 20 MEQ tablet Take 20 mEq by mouth as needed (when taking furosemide).  . pramipexole (MIRAPEX) 1 MG tablet Take 1 mg by mouth at bedtime.   . Semaglutide, 1 MG/DOSE, (OZEMPIC, 1 MG/DOSE,) 2 MG/1.5ML SOPN Inject 1 mg into the skin every Monday.  . silodosin (RAPAFLO) 8 MG CAPS capsule Take 8 mg by mouth daily with breakfast.  . SUMAtriptan (IMITREX) 6 MG/0.5ML SOLN injection Inject 6 mg into the skin every 2 (two) hours as needed for migraine or headache. May repeat in 2 hours if headache persists or recurs.     Allergies:   Morphine   Social History   Tobacco Use  . Smoking status: Never Smoker  . Smokeless tobacco: Never Used  Substance Use Topics  . Alcohol use: Yes    Comment: occ.  . Drug use: No     Family Hx: The patient's family history includes Cancer in his mother; Heart disease in his father.  ROS:   Please see the history of present illness.    As mentioned above All other systems reviewed and are negative.   Prior CV studies:   The following studies were reviewed today:  Results of CT scan and echo were described above  Labs/Other Tests and Data Reviewed:    EKG:  No ECG reviewed.  Recent Labs: 02/14/2019: ALT 25; B Natriuretic Peptide 43.7 02/15/2019: BUN 11; Creatinine, Ser 0.93; Hemoglobin 9.5; Platelets 237; Potassium 4.2; Sodium 141; TSH 1.471   Recent Lipid Panel Lab Results  Component Value Date/Time   CHOL 120 07/28/2011 04:30 PM   TRIG 119 07/28/2011 04:30 PM   HDL 53 07/28/2011 04:30 PM   CHOLHDL 2.3 07/28/2011 04:30 PM   LDLCALC 43 07/28/2011 04:30 PM    Wt Readings from Last 3 Encounters:  03/06/19 195 lb (88.5 kg)  02/01/19 194 lb (88 kg)  12/18/18 204 lb (92.5 kg)     Objective:    Vital Signs:  BP 140/84 (BP Location: Left Arm, Patient Position: Sitting, Cuff Size: Normal)   Pulse 96   Ht 6\' 3"  (1.905 m)   Wt 195 lb (88.5 kg)   SpO2 98%   BMI 24.37 kg/m    VITAL SIGNS:  reviewed  ASSESSMENT & PLAN:    1. Status  post aortic valve replacement: Patient is stable from this perspective.  Echocardiogram report was discussed with the patient at extensive length.  Patient was advised to continue 81 mg of coated aspirin on a daily basis. 2. Essential hypertension: Blood pressure stable and he has no  issues with hypotension now.  He tells me that he will be seeing Dr. Truman Hayward in the next few days and will get blood work from him.  I presume it will be a Chem-7 and a CBC in view of the fact that he is also being evaluated for anemia. 3. Ascending aortic dilatation: We will monitor this closely as recommended in the report.  He will also need a close check of his lipids. 4. Patient will be seen in follow-up appointment in 4 months or earlier if the patient has any concerns  COVID-19 Education: The signs and symptoms of COVID-19 were discussed with the patient and how to seek care for testing (follow up with PCP or arrange E-visit).  The importance of social distancing was discussed today.  Time:   Today, I have spent 15 minutes with the patient with telehealth technology discussing the above problems.     Medication Adjustments/Labs and Tests Ordered: Current medicines are reviewed at length with the patient today.  Concerns regarding medicines are outlined above.   Tests Ordered: No orders of the defined types were placed in this encounter.   Medication Changes: No orders of the defined types were placed in this encounter.   Follow Up:  Virtual Visit or In Person in 4 month(s)  Signed, Jenean Lindau, MD  03/06/2019 11:59 AM    [place]

## 2019-03-16 ENCOUNTER — Encounter: Payer: Medicare Other | Admitting: *Deleted

## 2019-03-16 ENCOUNTER — Telehealth: Payer: Self-pay

## 2019-03-16 NOTE — Telephone Encounter (Signed)
Left message for patient to remind of missed remote transmission.  

## 2019-03-20 ENCOUNTER — Ambulatory Visit (INDEPENDENT_AMBULATORY_CARE_PROVIDER_SITE_OTHER): Payer: Medicare Other | Admitting: *Deleted

## 2019-03-20 DIAGNOSIS — I442 Atrioventricular block, complete: Secondary | ICD-10-CM | POA: Diagnosis not present

## 2019-03-20 LAB — CUP PACEART REMOTE DEVICE CHECK
Date Time Interrogation Session: 20200714115017
Implantable Lead Implant Date: 20180710
Implantable Lead Implant Date: 20180710
Implantable Lead Location: 753859
Implantable Lead Location: 753860
Implantable Pulse Generator Implant Date: 20180710
Pulse Gen Model: 2272
Pulse Gen Serial Number: 8912213

## 2019-03-21 ENCOUNTER — Encounter: Payer: Self-pay | Admitting: Internal Medicine

## 2019-03-21 ENCOUNTER — Telehealth: Payer: Medicare Other | Admitting: Cardiology

## 2019-04-01 NOTE — Progress Notes (Signed)
Virtual Visit via Video Note The purpose of this virtual visit is to provide medical care while limiting exposure to the novel coronavirus.    Consent was obtained for video visit:  Yes Answered questions that patient had about telehealth interaction:  Yes I discussed the limitations, risks, security and privacy concerns of performing an evaluation and management service by telemedicine. I also discussed with the patient that there may be a patient responsible charge related to this service. The patient expressed understanding and agreed to proceed.  Pt location: Home Physician Location: Home Name of referring provider:  Jeralyn Bennett, DO I connected with Antonio Oliver at patients initiation/request on 04/02/2019 at  2:50 PM EDT by video enabled telemedicine application and verified that I am speaking with the correct person using two identifiers. Pt MRN:  [id number] Pt DOB:  1955-11-13 Video Participants:  Antonio Oliver; his wife   History of Present Illness:  Antonio Oliver is a 63 year old male with complete heart block s/p PPM, possible rheumatoid arthritis, diabetes, depression/anxiety, chronic back pain and history of cervical myelopathy who presents for migraines.  Onset:  1980s Location:  Usually bifrontal Quality:  Squeezing, throbbing, pounding Intensity:  7-8/10.  He denies new headache, thunderclap headache or severe headache that wakes from sleep. Aura:  none Premonitory Phase:  none Postdrome:  none Associated symptoms:  Photophobia, phonophobia, blurred vision.  He denies associated nausea, vomiting, unilateral numbness or weakness. Duration:  Usually a day.  Has happened up to 3 days. Frequency:  10 to 15 days a month, started Botox in 2018 and now has once a month. Frequency of abortive medication: 1 a month Triggers:  unknown Relieving factors:  none Activity:  aggravates  CT head 11/14/18 personally reviewed and was normal. CT cervical spine 11/06/18  personally reviewed and demonstrated multilevel severe facet arthropathy with moderate bilateral neural foraminal stenosis at C3-4 and C4-5 as well as prior interbody fusion at C6-7 without stenosis.  Rescue therapy:  If starts moderate, then Zomig tablet and then 2 hours later sumatriptan injection.  If starts severe, then sumatriptan injection first. Current NSAIDS:  ASA 81mg  Current analgesics:  none Current triptans:  Zomig 5mg , Sumatriptan 6mg  [place] Current ergotamine:  none Current anti-emetic:  none Current muscle relaxants:  none Current anti-anxiolytic:  BuSpar Current sleep aide:  none Current Antihypertensive medications:  Lasix Current Antidepressant medications:  Cymbalta 60mg  twice daily, Wellbutrin SR 200mg  twice daily, Remeron 60mg  Current Anticonvulsant medications:  none Current anti-CGRP:  none Current Vitamins/Herbal/Supplements:  Iron, folic acid, MVI Current Antihistamines/Decongestants:  none Other therapy:  Botox (last round was on June 4) Other medications:  Mirapex  Past NSAIDS:  ibuprofen Past analgesics:  Tylenol, Excedrin, Fioricet, Midrin Past abortive triptans:  none Past abortive ergotamine: none Past muscle relaxants:  Zanaflex Past anti-emetic:  none Past antihypertensive medications:  Verapamil, propranolol, clonidine Past antidepressant medications:  amitriptyline Past anticonvulsant medications:  topiramate 200mg  Past anti-CGRP:  none Past vitamins/Herbal/Supplements:  none Past antihistamines/decongestants:  none Other past therapies:  none  Caffeine:  1 cup coffee daily.   Diet:  Does not drink water or soda.   Exercise:  no Depression:  yes; Anxiety:  Yes.  Major depressive disorder.  Needed to retire in 2002.   Other pain:  Arthralgias, chronic neck pain, Charcot foot Sleep hygiene:  poor Family history of headache:  2 brothers had migraines.    Past Medical History: Past Medical History:  Diagnosis Date  . Anxiety 10/30/2018  .  Bicuspid aortic valve 12/22/2016  . BPH (benign prostatic hyperplasia)   . Bradycardia 02/01/2019  . Cervical myelopathy ([place]) 11/14/2018  . Chronic back pain 04/03/2013  . Chronic bilateral low back pain 11/17/2016  . Chronic inflammatory arthritis 11/17/2016   History of positive rheumatoid factor in the past. Denies placement on immunosuppressive therapy  . Compartment syndrome ([place]) 06/20/2013   Last Assessment & Plan:  Denies any problems with right leg since April/May Does have swelling at times if he is on this leg for long periods of time. Wears compression socks regularly and has helped with swelling.  . Complete heart block ([place]) 05/04/2017  . Depression   . Diabetes ([place]) 10/30/2018  . Diabetes mellitus   . Diabetes mellitus due to underlying condition with unspecified complications ([place]) 2/45/8099  . Diabetes mellitus type 2 in nonobese ([facility])   . Enthesopathy of ankle and tarsus 04/04/2013   Last Assessment & Plan:  Continues with swelling after prolonged standing.  Still  Wearing support hose, which helps.  . Essential hypertension 10/30/2018  . Fatigue 10/30/2018  . FHx: migraine headaches 04/04/2013  . Fracture of capitate bone of wrist 02/26/2019  . GERD (gastroesophageal reflux disease) 10/30/2018  . Glaucoma   . Glaucoma 04/03/2013  . Heart murmur 11/17/2016  . Hemangioma of skin and subcutaneous tissue 07/07/2017  . History of kidney stones 11/17/2016  . Hyperlipidemia 10/30/2018  . Hypertension   . Hypothyroidism 10/30/2018  . Lentigo 07/07/2017  . Lesion of plantar nerve 04/04/2013  . Leukocytosis   . Migraine 11/17/2016  . Mixed dyslipidemia 10/30/2018  . Morbid obesity ([place]) 10/27/2011  . Multiple actinic keratoses 07/07/2017  . Multiple benign melanocytic nevi 07/07/2017  . Neck pain 04/04/2013  . Neoplasm of uncertain behavior of skin 07/28/2018  . Nondisplaced fracture of medial cuneiform of left foot, initial encounter for closed fracture 11/16/2018  . Nonrheumatic aortic  valve stenosis 02/01/2019  . Osteoarthritis 10/30/2018  . Other seborrheic keratosis 07/07/2017  . Personal history of tobacco use, presenting hazards to health 04/04/2013  . Presence of permanent cardiac pacemaker   . S/P AVR (aortic valve replacement) 03/10/2017  . Severe single current episode of major depressive disorder, without psychotic features ([place]) 11/17/2016  . Status post bariatric surgery 04/04/2013  . Strain of rotator cuff 04/04/2013  . Surgery, elective   . Syncope 02/14/2019  . Vitamin D deficiency 10/30/2018  . Weakness     Medications: Outpatient Encounter Medications as of 04/02/2019  Medication Sig  . aspirin 81 MG tablet Take 81 mg by mouth daily.  [place] atorvastatin (LIPITOR) 20 MG tablet Take 20 mg by mouth daily at 6 PM.   . Brexpiprazole (REXULTI) 2 MG TABS Take 2 mg by mouth daily.   Marland Kitchen buPROPion (WELLBUTRIN SR) 200 MG 12 hr tablet Take 200 mg by mouth 2 (two) times daily.  . busPIRone (BUSPAR) 10 MG tablet Take 20 mg by mouth 2 (two) times daily.   . calcium citrate (CALCITRATE - DOSED IN MG ELEMENTAL CALCIUM) 950 MG tablet Take 200 mg of elemental calcium by mouth daily.  . DULoxetine (CYMBALTA) 60 MG capsule Take 60 mg by mouth 2 (two) times daily.   . ferrous sulfate 325 (65 FE) MG tablet Take 1 tablet (325 mg total) by mouth every other day.  . fexofenadine (ALLEGRA) 180 MG tablet Take 180 mg by mouth daily.  . finasteride (PROSCAR) 5 MG tablet Take 5 mg by mouth daily.  . fluticasone (FLONASE) 50 MCG/ACT nasal spray  Place 2 sprays into the nose daily.  . folic acid (FOLVITE) 1 MG tablet Take 1 mg by mouth daily.  . furosemide (LASIX) 20 MG tablet Take 20 mg by mouth as needed.  Marland Kitchen levothyroxine (SYNTHROID) 125 MCG tablet Take 125 mcg by mouth daily.   . Magnesium 500 MG CAPS Take 500 mg by mouth 2 (two) times a day.   . metFORMIN (GLUCOPHAGE) 1000 MG tablet Take 1,000 mg by mouth 2 (two) times daily with a meal.  . mirtazapine (REMERON) 30 MG tablet Take 60 mg by  mouth at bedtime.  . Multiple Vitamin (MULTIVITAMIN WITH MINERALS) TABS tablet Take 1 tablet by mouth daily.  Marland Kitchen omeprazole (PRILOSEC) 20 MG capsule Take 20 mg by mouth 2 (two) times daily before a meal.   . potassium chloride SA (K-DUR) 20 MEQ tablet Take 20 mEq by mouth as needed (when taking furosemide).  . pramipexole (MIRAPEX) 1 MG tablet Take 1 mg by mouth at bedtime.   . Semaglutide, 1 MG/DOSE, (OZEMPIC, 1 MG/DOSE,) 2 MG/1.5ML SOPN Inject 1 mg into the skin every Monday.  . silodosin (RAPAFLO) 8 MG CAPS capsule Take 8 mg by mouth daily with breakfast.  . SUMAtriptan (IMITREX) 6 MG/0.5ML SOLN injection Inject 6 mg into the skin every 2 (two) hours as needed for migraine or headache. May repeat in 2 hours if headache persists or recurs.   No facility-administered encounter medications on file as of 04/02/2019.     Allergies: Allergies  Allergen Reactions  . Morphine Itching    Can take with benadry, facial and nose itching     Family History: Family History  Problem Relation Age of Onset  . Cancer Mother        breast  . Heart disease Father     Social History: Social History   Socioeconomic History  . Marital status: Married    Spouse name: Not on file  . Number of children: Not on file  . Years of education: Not on file  . Highest education level: Not on file  Occupational History  . Not on file  Social Needs  . Financial resource strain: Not on file  . Food insecurity    Worry: Not on file    Inability: Not on file  . Transportation needs    Medical: Not on file    Non-medical: Not on file  Tobacco Use  . Smoking status: Never Smoker  . Smokeless tobacco: Never Used  Substance and Sexual Activity  . Alcohol use: Yes    Comment: occ.  . Drug use: No  . Sexual activity: Not on file  Lifestyle  . Physical activity    Days per week: Not on file    Minutes per session: Not on file  . Stress: Not on file  Relationships  . Social Herbalist on  phone: Not on file    Gets together: Not on file    Attends religious service: Not on file    Active member of club or organization: Not on file    Attends meetings of clubs or organizations: Not on file    Relationship status: Not on file  . Intimate partner violence    Fear of current or ex partner: No    Emotionally abused: No    Physically abused: No    Forced sexual activity: No  Other Topics Concern  . Not on file  Social History Narrative  . Not on file  Observations/Objective:   Height 6\' 3"  (1.905 m), weight 196 lb (88.9 kg). No acute distress.  Alert and oriented.  Speech fluent and not dysarthric.  Language intact.  Eyes orthophoric on primary gaze.  Face symmetric.  Assessment and Plan:   Migraine without aura, without status migrainosus, not intractable.  He has done well with Botox but would like to try one of the anti-CGRPs.  1.  For preventative management, Aimovig 70mg  monthly 2.  For abortive therapy, for moderate HA:  Zomig 5mg , sumatriptan [place] second line; for severe HA, sumatriptan [place] 3.  Limit use of pain relievers to no more than 2 days out of week to prevent risk of rebound or medication-overuse headache. 4.  Keep headache diary 5.  Exercise, hydration, caffeine cessation, sleep hygiene, monitor for and avoid triggers 6.  Consider:  magnesium citrate 400mg  daily, riboflavin 400mg  daily, and coenzyme Q10 100mg  three times daily 7. Always keep in mind that currently taking a hormone or birth control may be a possible trigger or aggravating factor for migraine. 8. Follow up 4 months   Follow Up Instructions:    -I discussed the assessment and treatment plan with the patient. The patient was provided an opportunity to ask questions and all were answered. The patient agreed with the plan and demonstrated an understanding of the instructions.   The patient was advised to call back or seek an in-person evaluation if the symptoms worsen or if the condition fails  to improve as anticipated.    Dudley Major, DO

## 2019-04-02 ENCOUNTER — Telehealth (INDEPENDENT_AMBULATORY_CARE_PROVIDER_SITE_OTHER): Payer: Medicare Other | Admitting: Neurology

## 2019-04-02 ENCOUNTER — Encounter: Payer: Self-pay | Admitting: Neurology

## 2019-04-02 ENCOUNTER — Other Ambulatory Visit: Payer: Self-pay

## 2019-04-02 VITALS — Ht 75.0 in | Wt 196.0 lb

## 2019-04-02 DIAGNOSIS — G43009 Migraine without aura, not intractable, without status migrainosus: Secondary | ICD-10-CM

## 2019-04-02 MED ORDER — AIMOVIG 70 MG/ML ~~LOC~~ SOAJ: 70.0000 mg | SUBCUTANEOUS | 3 refills | Status: DC

## 2019-04-05 ENCOUNTER — Encounter (HOSPITAL_COMMUNITY): Payer: Self-pay

## 2019-04-05 ENCOUNTER — Inpatient Hospital Stay (HOSPITAL_COMMUNITY)
Admission: EM | Admit: 2019-04-05 | Discharge: 2019-04-06 | DRG: 982 | Disposition: A | Discharge status: Home / Self Care | Payer: Medicare Other | Attending: Orthopaedic Surgery | Admitting: Orthopaedic Surgery

## 2019-04-05 ENCOUNTER — Other Ambulatory Visit: Payer: Self-pay

## 2019-04-05 ENCOUNTER — Encounter (HOSPITAL_COMMUNITY)
Admission: EM | Disposition: A | Discharge status: Home / Self Care | Payer: Self-pay | Source: Home / Self Care | Attending: Orthopaedic Surgery

## 2019-04-05 ENCOUNTER — Observation Stay (HOSPITAL_COMMUNITY): Payer: Medicare Other | Admitting: Anesthesiology

## 2019-04-05 ENCOUNTER — Emergency Department (HOSPITAL_COMMUNITY): Payer: Medicare Other

## 2019-04-05 DIAGNOSIS — Z841 Family history of disorders of kidney and ureter: Secondary | ICD-10-CM | POA: Diagnosis not present

## 2019-04-05 DIAGNOSIS — S8012XA Contusion of left lower leg, initial encounter: Secondary | ICD-10-CM | POA: Diagnosis present

## 2019-04-05 DIAGNOSIS — E119 Type 2 diabetes mellitus without complications: Secondary | ICD-10-CM | POA: Diagnosis present

## 2019-04-05 DIAGNOSIS — Z950 Presence of cardiac pacemaker: Secondary | ICD-10-CM

## 2019-04-05 DIAGNOSIS — Z8261 Family history of arthritis: Secondary | ICD-10-CM | POA: Diagnosis not present

## 2019-04-05 DIAGNOSIS — E039 Hypothyroidism, unspecified: Secondary | ICD-10-CM | POA: Diagnosis present

## 2019-04-05 DIAGNOSIS — Z20828 Contact with and (suspected) exposure to other viral communicable diseases: Secondary | ICD-10-CM | POA: Diagnosis present

## 2019-04-05 DIAGNOSIS — G8929 Other chronic pain: Secondary | ICD-10-CM | POA: Diagnosis present

## 2019-04-05 DIAGNOSIS — K219 Gastro-esophageal reflux disease without esophagitis: Secondary | ICD-10-CM | POA: Diagnosis present

## 2019-04-05 DIAGNOSIS — T79A0XA Compartment syndrome, unspecified, initial encounter: Secondary | ICD-10-CM | POA: Diagnosis present

## 2019-04-05 DIAGNOSIS — F322 Major depressive disorder, single episode, severe without psychotic features: Secondary | ICD-10-CM | POA: Diagnosis present

## 2019-04-05 DIAGNOSIS — Z833 Family history of diabetes mellitus: Secondary | ICD-10-CM | POA: Diagnosis not present

## 2019-04-05 DIAGNOSIS — Z87891 Personal history of nicotine dependence: Secondary | ICD-10-CM | POA: Diagnosis not present

## 2019-04-05 DIAGNOSIS — N400 Enlarged prostate without lower urinary tract symptoms: Secondary | ICD-10-CM | POA: Diagnosis present

## 2019-04-05 DIAGNOSIS — Z952 Presence of prosthetic heart valve: Secondary | ICD-10-CM | POA: Diagnosis not present

## 2019-04-05 DIAGNOSIS — M545 Low back pain: Secondary | ICD-10-CM | POA: Diagnosis present

## 2019-04-05 DIAGNOSIS — Z7984 Long term (current) use of oral hypoglycemic drugs: Secondary | ICD-10-CM | POA: Diagnosis not present

## 2019-04-05 DIAGNOSIS — W16012A Fall into swimming pool striking water surface causing other injury, initial encounter: Secondary | ICD-10-CM | POA: Diagnosis present

## 2019-04-05 DIAGNOSIS — E782 Mixed hyperlipidemia: Secondary | ICD-10-CM | POA: Diagnosis present

## 2019-04-05 DIAGNOSIS — Z8249 Family history of ischemic heart disease and other diseases of the circulatory system: Secondary | ICD-10-CM

## 2019-04-05 DIAGNOSIS — T79A22A Traumatic compartment syndrome of left lower extremity, initial encounter: Principal | ICD-10-CM | POA: Diagnosis present

## 2019-04-05 DIAGNOSIS — R52 Pain, unspecified: Secondary | ICD-10-CM

## 2019-04-05 DIAGNOSIS — Z9884 Bariatric surgery status: Secondary | ICD-10-CM | POA: Diagnosis not present

## 2019-04-05 DIAGNOSIS — Z79890 Hormone replacement therapy (postmenopausal): Secondary | ICD-10-CM | POA: Diagnosis not present

## 2019-04-05 DIAGNOSIS — Z809 Family history of malignant neoplasm, unspecified: Secondary | ICD-10-CM

## 2019-04-05 DIAGNOSIS — Z79899 Other long term (current) drug therapy: Secondary | ICD-10-CM

## 2019-04-05 DIAGNOSIS — I10 Essential (primary) hypertension: Secondary | ICD-10-CM | POA: Diagnosis present

## 2019-04-05 DIAGNOSIS — Z7982 Long term (current) use of aspirin: Secondary | ICD-10-CM

## 2019-04-05 DIAGNOSIS — I350 Nonrheumatic aortic (valve) stenosis: Secondary | ICD-10-CM | POA: Diagnosis present

## 2019-04-05 HISTORY — PX: HEMATOMA EVACUATION: SHX5118

## 2019-04-05 HISTORY — PX: FASCIOTOMY: SHX132

## 2019-04-05 HISTORY — PX: APPLICATION OF WOUND VAC: SHX5189

## 2019-04-05 LAB — CBC WITH DIFFERENTIAL/PLATELET
Abs Immature Granulocytes: 0.03 10*3/uL (ref 0.00–0.07)
Basophils Absolute: 0.1 10*3/uL (ref 0.0–0.1)
Basophils Relative: 1 %
Eosinophils Absolute: 0.4 10*3/uL (ref 0.0–0.5)
Eosinophils Relative: 5 %
HCT: 38 % — ABNORMAL LOW (ref 39.0–52.0)
Hemoglobin: 11.6 g/dL — ABNORMAL LOW (ref 13.0–17.0)
Immature Granulocytes: 0 %
Lymphocytes Relative: 28 %
Lymphs Abs: 2.5 10*3/uL (ref 0.7–4.0)
MCH: 25.8 pg — ABNORMAL LOW (ref 26.0–34.0)
MCHC: 30.5 g/dL (ref 30.0–36.0)
MCV: 84.4 fL (ref 80.0–100.0)
Monocytes Absolute: 0.6 10*3/uL (ref 0.1–1.0)
Monocytes Relative: 7 %
Neutro Abs: 5.3 10*3/uL (ref 1.7–7.7)
Neutrophils Relative %: 59 %
Platelets: 207 10*3/uL (ref 150–400)
RBC: 4.5 MIL/uL (ref 4.22–5.81)
RDW: 17.9 % — ABNORMAL HIGH (ref 11.5–15.5)
WBC: 9 10*3/uL (ref 4.0–10.5)
nRBC: 0 % (ref 0.0–0.2)

## 2019-04-05 LAB — BASIC METABOLIC PANEL
Anion gap: 7 (ref 5–15)
BUN: 14 mg/dL (ref 8–23)
CO2: 24 mmol/L (ref 22–32)
Calcium: 8.7 mg/dL — ABNORMAL LOW (ref 8.9–10.3)
Chloride: 109 mmol/L (ref 98–111)
Creatinine, Ser: 0.86 mg/dL (ref 0.61–1.24)
GFR calc Af Amer: >60 mL/min (ref >60)
GFR calc non Af Amer: >60 mL/min (ref >60)
Glucose, Bld: 125 mg/dL — ABNORMAL HIGH (ref 70–99)
Potassium: 4.2 mmol/L (ref 3.5–5.1)
Sodium: 140 mmol/L (ref 135–145)

## 2019-04-05 LAB — GLUCOSE, CAPILLARY
Glucose-Capillary: 155 mg/dL — ABNORMAL HIGH (ref 70–99)
Glucose-Capillary: 132 mg/dL — ABNORMAL HIGH (ref 70–99)
Glucose-Capillary: 143 mg/dL — ABNORMAL HIGH (ref 70–99)

## 2019-04-05 LAB — SARS CORONAVIRUS 2 BY RT PCR (HOSPITAL ORDER, PERFORMED IN ~~LOC~~ HOSPITAL LAB): SARS Coronavirus 2: NEGATIVE

## 2019-04-05 LAB — PROTIME-INR
INR: 1.1 (ref 0.8–1.2)
Prothrombin Time: 13.7 seconds (ref 11.4–15.2)

## 2019-04-05 LAB — APTT: aPTT: 31 seconds (ref 24–36)

## 2019-04-05 SURGERY — FASCIOTOMY, UPPER EXTREMITY
Anesthesia: General | Site: Leg Lower | Laterality: Left

## 2019-04-05 MED ORDER — ALBUMIN HUMAN 5 % IV SOLN
INTRAVENOUS | Status: DC | PRN
  Administered 2019-04-05: 09:00:00 via INTRAVENOUS

## 2019-04-05 MED ORDER — BREXPIPRAZOLE 1 MG PO TABS
2.0000 mg | ORAL_TABLET | Freq: Every day | ORAL | Status: DC
  Administered 2019-04-06: 09:00:00 2 mg via ORAL
  Filled 2019-04-05 (×4): qty 2

## 2019-04-05 MED ORDER — FENTANYL CITRATE (PF) 250 MCG/5ML IJ SOLN
INTRAMUSCULAR | Status: AC
  Filled 2019-04-05: qty 5

## 2019-04-05 MED ORDER — FENTANYL CITRATE (PF) 100 MCG/2ML IJ SOLN
100.0000 ug | Freq: Once | INTRAMUSCULAR | Status: AC
  Administered 2019-04-05: 100 ug via INTRAVENOUS
  Filled 2019-04-05: qty 2

## 2019-04-05 MED ORDER — TAMSULOSIN HCL 0.4 MG PO CAPS
0.4000 mg | ORAL_CAPSULE | Freq: Every day | ORAL | Status: DC
  Administered 2019-04-06: 09:00:00 0.4 mg via ORAL
  Filled 2019-04-05: qty 1

## 2019-04-05 MED ORDER — FENTANYL CITRATE (PF) 100 MCG/2ML IJ SOLN: 25.0000 ug | INTRAMUSCULAR | Status: DC | PRN

## 2019-04-05 MED ORDER — ONDANSETRON HCL 4 MG/2ML IJ SOLN: 4.0000 mg | Freq: Once | INTRAMUSCULAR | Status: DC | PRN

## 2019-04-05 MED ORDER — SODIUM CHLORIDE 0.9 % IV SOLN
INTRAVENOUS | Status: DC | PRN
  Administered 2019-04-05: 40 ug/min via INTRAVENOUS

## 2019-04-05 MED ORDER — BUSPIRONE HCL 10 MG PO TABS
20.0000 mg | ORAL_TABLET | Freq: Two times a day (BID) | ORAL | Status: DC
  Administered 2019-04-05 – 2019-04-06 (×2): 20 mg via ORAL
  Filled 2019-04-05 (×2): qty 2

## 2019-04-05 MED ORDER — OXYCODONE HCL 5 MG/5ML PO SOLN: 5.0000 mg | Freq: Once | ORAL | Status: DC | PRN

## 2019-04-05 MED ORDER — DOCUSATE SODIUM 100 MG PO CAPS
100.0000 mg | ORAL_CAPSULE | Freq: Two times a day (BID) | ORAL | Status: DC
  Administered 2019-04-05 – 2019-04-06 (×2): 100 mg via ORAL
  Filled 2019-04-05 (×2): qty 1

## 2019-04-05 MED ORDER — ATORVASTATIN CALCIUM 10 MG PO TABS
20.0000 mg | ORAL_TABLET | Freq: Every day | ORAL | Status: DC
  Administered 2019-04-05: 20 mg via ORAL
  Filled 2019-04-05: qty 2

## 2019-04-05 MED ORDER — BREXPIPRAZOLE 1 MG PO TABS: 2.0000 mg | ORAL_TABLET | Freq: Every day | ORAL | Status: DC

## 2019-04-05 MED ORDER — DIPHENHYDRAMINE HCL 50 MG/ML IJ SOLN
12.5000 mg | Freq: Once | INTRAMUSCULAR | Status: AC
  Administered 2019-04-05: 12.5 mg via INTRAVENOUS
  Filled 2019-04-05: qty 1

## 2019-04-05 MED ORDER — LEVOTHYROXINE SODIUM 25 MCG PO TABS
125.0000 ug | ORAL_TABLET | Freq: Every day | ORAL | Status: DC
  Administered 2019-04-06: 125 ug via ORAL
  Filled 2019-04-05: qty 1

## 2019-04-05 MED ORDER — CLONAZEPAM 1 MG PO TABS
1.0000 mg | ORAL_TABLET | Freq: Every day | ORAL | Status: DC
  Administered 2019-04-05: 1 mg via ORAL
  Filled 2019-04-05: qty 1

## 2019-04-05 MED ORDER — ACETAMINOPHEN 325 MG PO TABS: 325.0000 mg | ORAL_TABLET | Freq: Four times a day (QID) | ORAL | Status: DC | PRN

## 2019-04-05 MED ORDER — ONDANSETRON HCL 4 MG/2ML IJ SOLN: 4.0000 mg | Freq: Four times a day (QID) | INTRAMUSCULAR | Status: DC | PRN

## 2019-04-05 MED ORDER — MIRTAZAPINE 15 MG PO TABS
30.0000 mg | ORAL_TABLET | Freq: Every day | ORAL | Status: DC
  Administered 2019-04-05: 30 mg via ORAL
  Filled 2019-04-05: qty 2

## 2019-04-05 MED ORDER — ROCURONIUM BROMIDE 10 MG/ML (PF) SYRINGE
PREFILLED_SYRINGE | INTRAVENOUS | Status: DC | PRN
  Administered 2019-04-05: 60 mg via INTRAVENOUS

## 2019-04-05 MED ORDER — OXYCODONE HCL 5 MG PO TABS: 5.0000 mg | ORAL_TABLET | Freq: Once | ORAL | Status: DC | PRN

## 2019-04-05 MED ORDER — ONDANSETRON HCL 4 MG/2ML IJ SOLN
INTRAMUSCULAR | Status: DC | PRN
  Administered 2019-04-05: 4 mg via INTRAVENOUS

## 2019-04-05 MED ORDER — LIDOCAINE 2% (20 MG/ML) 5 ML SYRINGE
INTRAMUSCULAR | Status: DC | PRN
  Administered 2019-04-05: 60 mg via INTRAVENOUS

## 2019-04-05 MED ORDER — INSULIN ASPART 100 UNIT/ML ~~LOC~~ SOLN: 0.0000 [IU] | Freq: Every day | SUBCUTANEOUS | Status: DC

## 2019-04-05 MED ORDER — HYDROMORPHONE HCL 1 MG/ML IJ SOLN
1.0000 mg | Freq: Once | INTRAMUSCULAR | Status: AC
  Administered 2019-04-05: 1 mg via INTRAVENOUS
  Filled 2019-04-05: qty 1

## 2019-04-05 MED ORDER — LACTATED RINGERS IV SOLN
INTRAVENOUS | Status: DC
  Administered 2019-04-05 (×2): via INTRAVENOUS

## 2019-04-05 MED ORDER — ACETAMINOPHEN 500 MG PO TABS
500.0000 mg | ORAL_TABLET | Freq: Four times a day (QID) | ORAL | Status: AC
  Administered 2019-04-05 – 2019-04-06 (×3): 500 mg via ORAL
  Filled 2019-04-05 (×3): qty 1

## 2019-04-05 MED ORDER — METHOCARBAMOL 500 MG PO TABS
500.0000 mg | ORAL_TABLET | Freq: Four times a day (QID) | ORAL | Status: DC | PRN
  Administered 2019-04-05: 500 mg via ORAL
  Filled 2019-04-05: qty 1

## 2019-04-05 MED ORDER — CEFAZOLIN SODIUM-DEXTROSE 2-4 GM/100ML-% IV SOLN
INTRAVENOUS | Status: AC
  Filled 2019-04-05: qty 100

## 2019-04-05 MED ORDER — INSULIN ASPART 100 UNIT/ML ~~LOC~~ SOLN
0.0000 [IU] | Freq: Three times a day (TID) | SUBCUTANEOUS | Status: DC
  Administered 2019-04-05: 5 [IU] via SUBCUTANEOUS
  Administered 2019-04-06 (×2): 2 [IU] via SUBCUTANEOUS

## 2019-04-05 MED ORDER — PROPOFOL 10 MG/ML IV BOLUS
INTRAVENOUS | Status: DC | PRN
  Administered 2019-04-05: 150 mg via INTRAVENOUS

## 2019-04-05 MED ORDER — PHENYLEPHRINE 40 MCG/ML (10ML) SYRINGE FOR IV PUSH (FOR BLOOD PRESSURE SUPPORT)
PREFILLED_SYRINGE | INTRAVENOUS | Status: DC | PRN
  Administered 2019-04-05: 160 ug via INTRAVENOUS
  Administered 2019-04-05: 120 ug via INTRAVENOUS
  Administered 2019-04-05: 160 ug via INTRAVENOUS
  Administered 2019-04-05: 120 ug via INTRAVENOUS

## 2019-04-05 MED ORDER — FENTANYL CITRATE (PF) 100 MCG/2ML IJ SOLN
INTRAMUSCULAR | Status: DC | PRN
  Administered 2019-04-05: 50 ug via INTRAVENOUS

## 2019-04-05 MED ORDER — CALCIUM CITRATE 950 (200 CA) MG PO TABS
200.0000 mg | ORAL_TABLET | Freq: Every day | ORAL | Status: DC
  Administered 2019-04-06: 200 mg via ORAL
  Filled 2019-04-05 (×2): qty 1

## 2019-04-05 MED ORDER — HYDROCODONE-ACETAMINOPHEN 7.5-325 MG PO TABS
1.0000 | ORAL_TABLET | ORAL | Status: DC | PRN
  Administered 2019-04-05: 2 via ORAL
  Filled 2019-04-05: qty 2

## 2019-04-05 MED ORDER — CEFAZOLIN SODIUM-DEXTROSE 2-4 GM/100ML-% IV SOLN
2.0000 g | Freq: Four times a day (QID) | INTRAVENOUS | Status: AC
  Administered 2019-04-05 – 2019-04-06 (×3): 2 g via INTRAVENOUS
  Filled 2019-04-05 (×3): qty 100

## 2019-04-05 MED ORDER — SUGAMMADEX SODIUM 200 MG/2ML IV SOLN
INTRAVENOUS | Status: DC | PRN
  Administered 2019-04-05: 200 mg via INTRAVENOUS

## 2019-04-05 MED ORDER — ONDANSETRON HCL 4 MG PO TABS: 4.0000 mg | ORAL_TABLET | Freq: Four times a day (QID) | ORAL | Status: DC | PRN

## 2019-04-05 MED ORDER — METOCLOPRAMIDE HCL 5 MG/ML IJ SOLN: 5.0000 mg | Freq: Three times a day (TID) | INTRAMUSCULAR | Status: DC | PRN

## 2019-04-05 MED ORDER — CEFAZOLIN SODIUM-DEXTROSE 2-4 GM/100ML-% IV SOLN
2.0000 g | INTRAVENOUS | Status: AC
  Administered 2019-04-05: 2 g via INTRAVENOUS

## 2019-04-05 MED ORDER — LIDOCAINE 2% (20 MG/ML) 5 ML SYRINGE
INTRAMUSCULAR | Status: AC
  Filled 2019-04-05: qty 5

## 2019-04-05 MED ORDER — 0.9 % SODIUM CHLORIDE (POUR BTL) OPTIME
TOPICAL | Status: DC | PRN
  Administered 2019-04-05 (×3): 1000 mL

## 2019-04-05 MED ORDER — FERROUS SULFATE 325 (65 FE) MG PO TABS
325.0000 mg | ORAL_TABLET | ORAL | Status: DC
  Administered 2019-04-06: 325 mg via ORAL
  Filled 2019-04-05: qty 1

## 2019-04-05 MED ORDER — METOCLOPRAMIDE HCL 5 MG PO TABS: 5.0000 mg | ORAL_TABLET | Freq: Three times a day (TID) | ORAL | Status: DC | PRN

## 2019-04-05 MED ORDER — HYDROCODONE-ACETAMINOPHEN 5-325 MG PO TABS: 1.0000 | ORAL_TABLET | ORAL | Status: DC | PRN

## 2019-04-05 MED ORDER — MIDAZOLAM HCL 2 MG/2ML IJ SOLN
INTRAMUSCULAR | Status: AC
  Filled 2019-04-05: qty 2

## 2019-04-05 MED ORDER — METHOCARBAMOL 1000 MG/10ML IJ SOLN
500.0000 mg | Freq: Four times a day (QID) | INTRAVENOUS | Status: DC | PRN
  Filled 2019-04-05: qty 5

## 2019-04-05 MED ORDER — DULOXETINE HCL 60 MG PO CPEP
60.0000 mg | ORAL_CAPSULE | Freq: Two times a day (BID) | ORAL | Status: DC
  Administered 2019-04-05 – 2019-04-06 (×2): 60 mg via ORAL
  Filled 2019-04-05 (×2): qty 1

## 2019-04-05 MED ORDER — BREXPIPRAZOLE 2 MG PO TABS: 2.0000 mg | ORAL_TABLET | Freq: Every day | ORAL | Status: DC

## 2019-04-05 MED ORDER — MIDAZOLAM HCL 2 MG/2ML IJ SOLN
INTRAMUSCULAR | Status: DC | PRN
  Administered 2019-04-05: 2 mg via INTRAVENOUS

## 2019-04-05 MED ORDER — DEXAMETHASONE SODIUM PHOSPHATE 10 MG/ML IJ SOLN
INTRAMUSCULAR | Status: DC | PRN
  Administered 2019-04-05: 4 mg via INTRAVENOUS

## 2019-04-05 MED ORDER — BUPROPION HCL ER (SR) 100 MG PO TB12
200.0000 mg | ORAL_TABLET | Freq: Two times a day (BID) | ORAL | Status: DC
  Administered 2019-04-06: 200 mg via ORAL
  Filled 2019-04-05 (×3): qty 2

## 2019-04-05 SURGICAL SUPPLY — 48 items
BANDAGE ESMARK 6X9 LF (GAUZE/BANDAGES/DRESSINGS) IMPLANT
BNDG CMPR 9X4 STRL LF SNTH (GAUZE/BANDAGES/DRESSINGS) ×1
BNDG CMPR 9X6 STRL LF SNTH (GAUZE/BANDAGES/DRESSINGS)
BNDG COHESIVE 4X5 TAN STRL (GAUZE/BANDAGES/DRESSINGS) IMPLANT
BNDG ELASTIC 6X10 VLCR STRL LF (GAUZE/BANDAGES/DRESSINGS) ×1 IMPLANT
BNDG ESMARK 4X9 LF (GAUZE/BANDAGES/DRESSINGS) ×1 IMPLANT
BNDG ESMARK 6X9 LF (GAUZE/BANDAGES/DRESSINGS)
BNDG GAUZE ELAST 4 BULKY (GAUZE/BANDAGES/DRESSINGS) ×1 IMPLANT
CANISTER WOUND CARE 500ML ATS (WOUND CARE) ×1 IMPLANT
COVER SURGICAL LIGHT HANDLE (MISCELLANEOUS) ×3 IMPLANT
CUFF TOURN SGL QUICK 34 (TOURNIQUET CUFF) ×1
CUFF TRNQT CYL 34X4.125X (TOURNIQUET CUFF) ×1 IMPLANT
DRAPE U-SHAPE 47X51 STRL (DRAPES) ×2 IMPLANT
DRESSING PREVENA PLUS CUSTOM (GAUZE/BANDAGES/DRESSINGS) IMPLANT
DRSG ADAPTIC 3X8 NADH LF (GAUZE/BANDAGES/DRESSINGS) ×1 IMPLANT
DRSG PAD ABDOMINAL 8X10 ST (GAUZE/BANDAGES/DRESSINGS) IMPLANT
DRSG PREVENA PLUS CUSTOM (GAUZE/BANDAGES/DRESSINGS) ×2
DRSG XEROFORM 1X8 (GAUZE/BANDAGES/DRESSINGS) IMPLANT
DURAPREP 26ML APPLICATOR (WOUND CARE) ×3 IMPLANT
ELECT REM PT RETURN 9FT ADLT (ELECTROSURGICAL) ×2
ELECTRODE REM PT RTRN 9FT ADLT (ELECTROSURGICAL) ×1 IMPLANT
GAUZE SPONGE 4X4 12PLY STRL (GAUZE/BANDAGES/DRESSINGS) IMPLANT
GAUZE SPONGE 4X4 12PLY STRL LF (GAUZE/BANDAGES/DRESSINGS) ×1 IMPLANT
GAUZE XEROFORM 1X8 LF (GAUZE/BANDAGES/DRESSINGS) ×1 IMPLANT
GLOVE BIO SURGEON STRL SZ 6.5 (GLOVE) ×2 IMPLANT
GLOVE BIOGEL M STRL SZ7.5 (GLOVE) ×2 IMPLANT
GLOVE INDICATOR 8.0 STRL GRN (GLOVE) ×2 IMPLANT
GOWN STRL REUS W/ TWL LRG LVL3 (GOWN DISPOSABLE) ×1 IMPLANT
GOWN STRL REUS W/ TWL XL LVL3 (GOWN DISPOSABLE) ×1 IMPLANT
GOWN STRL REUS W/TWL LRG LVL3 (GOWN DISPOSABLE) ×2
GOWN STRL REUS W/TWL XL LVL3 (GOWN DISPOSABLE) ×1
HANDPIECE INTERPULSE COAX TIP (DISPOSABLE)
KIT BASIN OR (CUSTOM PROCEDURE TRAY) ×2 IMPLANT
KIT DRSG PREVENA PLUS 7DAY 125 (MISCELLANEOUS) ×1 IMPLANT
KIT TURNOVER KIT B (KITS) ×2 IMPLANT
NEEDLE 22X1 1/2 (OR ONLY) (NEEDLE) IMPLANT
NS IRRIG 1000ML POUR BTL (IV SOLUTION) ×4 IMPLANT
PACK ORTHO EXTREMITY (CUSTOM PROCEDURE TRAY) ×2 IMPLANT
PAD CAST 4YDX4 CTTN HI CHSV (CAST SUPPLIES) ×1 IMPLANT
PADDING CAST COTTON 4X4 STRL (CAST SUPPLIES)
SET HNDPC FAN SPRY TIP SCT (DISPOSABLE) ×1 IMPLANT
SPONGE LAP 18X18 RF (DISPOSABLE) ×1 IMPLANT
STOCKINETTE IMPERVIOUS 9X36 MD (GAUZE/BANDAGES/DRESSINGS) IMPLANT
SUT ETHILON 2 0 FS 18 (SUTURE) ×3 IMPLANT
SUT MON AB 2-0 CT1 36 (SUTURE) ×1 IMPLANT
TUBE CONNECTING 12X1/4 (SUCTIONS) ×2 IMPLANT
UNDERPAD 30X30 (UNDERPADS AND DIAPERS) ×2 IMPLANT
YANKAUER SUCT BULB TIP NO VENT (SUCTIONS) ×2 IMPLANT

## 2019-04-05 NOTE — Anesthesia Procedure Notes (Signed)
Procedure Name: Intubation Date/Time: 04/05/2019 8:27 AM Performed by: Leonor Liv, CRNA Pre-anesthesia Checklist: Patient identified, Emergency Drugs available, Suction available and Patient being monitored Patient Re-evaluated:Patient Re-evaluated prior to induction Oxygen Delivery Method: Circle System Utilized Preoxygenation: Pre-oxygenation with 100% oxygen Induction Type: IV induction Ventilation: Mask ventilation without difficulty and Oral airway inserted - appropriate to patient size Laryngoscope Size: 4 and Glidescope Grade View: Grade I Tube type: Oral Tube size: 7.5 mm Number of attempts: 1 Airway Equipment and Method: Stylet and Oral airway Placement Confirmation: ETT inserted through vocal cords under direct vision,  positive ETCO2 and breath sounds checked- equal and bilateral Secured at: 23 cm Tube secured with: Tape Dental Injury: Teeth and Oropharynx as per pre-operative assessment

## 2019-04-05 NOTE — H&P (Addendum)
Antonio Oliver is an 63 y.o. male.   Chief Complaint: Left leg pain and swelling HPI: Antonio Oliver is a 63 year old male who fell in his pool yesterday evening around 5 PM.  He had some area of ecchymosis on the proximal medial leg.  This morning he woke up to significant pain and increased swelling of his left leg.  He was brought to the emergency department given that he has had a history of contralateral leg compartment syndrome requiring 4 compartment release.  He states the pain in his left leg was similar to that pain.  Patient has a history of prior tibia and fibula fracture and IM nailing.  Patient states after IM nailing of his left leg he developed Charcot change in his left foot.  Patient does have diabetes with an A1c in the 5 range.  Patient denies a history of neuropathy but does note decreased sensation in his left foot compared to the right foot after undergoing foot surgery previously.  Patient states the pain in his left leg is severe and sharp in quality.  It hurts to move his leg.  He is noted some blisters popping up over the past hour or so.  He states when he had compartment syndrome on the contralateral leg he also had blisters.  Patient denies any recent fevers or chills.  Denies any other joint or extremity pain. He takes an 81 mg aspirin daily but no other anticoagulants and denies a history of bleeding or clotting disorders.  Orthopedics was consulted.  X-rays in the emergency department did not reveal any bony injury.  There is an IM nail present in the tibia with healed proximal fibula and distal tibia fractures.  Past Medical History:  Diagnosis Date  . Anxiety 10/30/2018  . Bicuspid aortic valve 12/22/2016  . BPH (benign prostatic hyperplasia)   . Bradycardia 02/01/2019  . Cervical myelopathy ([place]) 11/14/2018  . Chronic back pain 04/03/2013  . Chronic bilateral low back pain 11/17/2016  . Chronic inflammatory arthritis 11/17/2016   History of positive rheumatoid factor in the  past. Denies placement on immunosuppressive therapy  . Compartment syndrome ([place]) 06/20/2013   Last Assessment & Plan:  Denies any problems with right leg since April/May Does have swelling at times if he is on this leg for long periods of time. Wears compression socks regularly and has helped with swelling.  . Complete heart block ([place]) 05/04/2017  . Depression   . Diabetes ([place]) 10/30/2018  . Diabetes mellitus   . Diabetes mellitus due to underlying condition with unspecified complications ([place]) 1/61/0960  . Diabetes mellitus type 2 in nonobese ([facility])   . Enthesopathy of ankle and tarsus 04/04/2013   Last Assessment & Plan:  Continues with swelling after prolonged standing.  Still  Wearing support hose, which helps.  . Essential hypertension 10/30/2018  . Fatigue 10/30/2018  . FHx: migraine headaches 04/04/2013  . Fracture of capitate bone of wrist 02/26/2019  . GERD (gastroesophageal reflux disease) 10/30/2018  . Glaucoma   . Glaucoma 04/03/2013  . Heart murmur 11/17/2016  . Hemangioma of skin and subcutaneous tissue 07/07/2017  . History of kidney stones 11/17/2016  . Hyperlipidemia 10/30/2018  . Hypertension   . Hypothyroidism 10/30/2018  . Lentigo 07/07/2017  . Lesion of plantar nerve 04/04/2013  . Leukocytosis   . Migraine 11/17/2016  . Mixed dyslipidemia 10/30/2018  . Morbid obesity ([place]) 10/27/2011  . Multiple actinic keratoses 07/07/2017  . Multiple benign melanocytic nevi 07/07/2017  . Neck pain 04/04/2013  .  Neoplasm of uncertain behavior of skin 07/28/2018  . Nondisplaced fracture of medial cuneiform of left foot, initial encounter for closed fracture 11/16/2018  . Nonrheumatic aortic valve stenosis 02/01/2019  . Osteoarthritis 10/30/2018  . Other seborrheic keratosis 07/07/2017  . Personal history of tobacco use, presenting hazards to health 04/04/2013  . Presence of permanent cardiac pacemaker   . S/P AVR (aortic valve replacement) 03/10/2017  . Severe single current episode of major  depressive disorder, without psychotic features ([place]) 11/17/2016  . Status post bariatric surgery 04/04/2013  . Strain of rotator cuff 04/04/2013  . Surgery, elective   . Syncope 02/14/2019  . Vitamin D deficiency 10/30/2018  . Weakness     Past Surgical History:  Procedure Laterality Date  . ANTERIOR CERVICAL DECOMP/DISCECTOMY FUSION N/A 11/17/2018   Procedure: CERVICAL THREE-CERVICAL FOUR, CERVICAL FOUR-CERVICAL FIVE, CERVICAL FIVE-CERVICAL SIX ANTERIOR CERVICAL DECOMPRESSION/DISCECTOMY FUSION;  Surgeon: Earnie Larsson, MD;  Location: [facility];  Service: Neurosurgery;  Laterality: N/A;  . BACK SURGERY     x4  . FOOT NEUROMA SURGERY    . GASTRIC BYPASS    . HEMORRHOID SURGERY     over 30 years ago  . HERNIA REPAIR     LIH umb  . NASAL SINUS SURGERY     x2  . NECK SURGERY      Family History  Problem Relation Age of Onset  . Cancer Mother        breast  . Rheum arthritis Mother   . Diabetes Mother   . Heart disease Father   . Kidney disease Father   . Diabetes Father   . Hypertension Brother   . Diabetes Brother   . Osteoarthritis Brother   . Heart disease Brother   . Hypertension Brother   . Diabetes Brother   . Rheum arthritis Brother   . Depression Daughter   . Anxiety disorder Daughter    Social History:  reports that he has never smoked. He has never used smokeless tobacco. He reports current alcohol use of about 1.0 standard drinks of alcohol per week. He reports that he does not use drugs.  Allergies:  Allergies  Allergen Reactions  . Morphine Itching    Can take with benadry, facial and nose itching     (Not in a hospital admission)   Results for orders placed or performed during the hospital encounter of 04/05/19 (from the past 48 hour(s))  Basic metabolic panel     Status: Abnormal   Collection Time: 04/05/19  5:50 AM  Result Value Ref Range   Sodium 140 135 - 145 mmol/L   Potassium 4.2 3.5 - 5.1 mmol/L   Chloride 109 98 - 111 mmol/L   CO2 24 22 - 32  mmol/L   Glucose, Bld 125 (H) 70 - 99 mg/dL   BUN 14 8 - 23 mg/dL   Creatinine, Ser 0.86 0.61 - 1.24 mg/dL   Calcium 8.7 (L) 8.9 - 10.3 mg/dL   GFR calc non Af Amer >60 >60 mL/min   GFR calc Af Amer >60 >60 mL/min   Anion gap 7 5 - 15    Comment: Performed at [facility], [place] [address] [place], [place] [address]  CBC with Differential/Platelet     Status: Abnormal   Collection Time: 04/05/19  5:50 AM  Result Value Ref Range   WBC 9.0 4.0 - 10.5 K/uL   RBC 4.50 4.22 - 5.81 MIL/uL   Hemoglobin 11.6 (L) 13.0 - 17.0 g/dL  HCT 38.0 (L) 39.0 - 52.0 %   MCV 84.4 80.0 - 100.0 fL   MCH 25.8 (L) 26.0 - 34.0 pg   MCHC 30.5 30.0 - 36.0 g/dL   RDW 17.9 (H) 11.5 - 15.5 %   Platelets 207 150 - 400 K/uL   nRBC 0.0 0.0 - 0.2 %   Neutrophils Relative % 59 %   Neutro Abs 5.3 1.7 - 7.7 K/uL   Lymphocytes Relative 28 %   Lymphs Abs 2.5 0.7 - 4.0 K/uL   Monocytes Relative 7 %   Monocytes Absolute 0.6 0.1 - 1.0 K/uL   Eosinophils Relative 5 %   Eosinophils Absolute 0.4 0.0 - 0.5 K/uL   Basophils Relative 1 %   Basophils Absolute 0.1 0.0 - 0.1 K/uL   Immature Granulocytes 0 %   Abs Immature Granulocytes 0.03 0.00 - 0.07 K/uL    Comment: Performed at [facility], [place] [address], [place], [place] [address]  Protime-INR     Status: None   Collection Time: 04/05/19  5:50 AM  Result Value Ref Range   Prothrombin Time 13.7 11.4 - 15.2 seconds   INR 1.1 0.8 - 1.2    Comment: (NOTE) INR goal varies based on device and disease states. Performed at [facility], [place] [address] [place],  [address]   SARS Coronavirus 2 (CEPHEID - Performed in [place] hospital lab), Hosp Order     Status: None   Collection Time: 04/05/19  5:50 AM   Specimen: Nasopharyngeal Swab  Result Value Ref Range   SARS Coronavirus 2 NEGATIVE NEGATIVE    Comment: (NOTE) If result is NEGATIVE SARS-CoV-2 target nucleic acids are NOT DETECTED. The SARS-CoV-2  RNA is generally detectable in upper and lower  respiratory specimens during the acute phase of infection. The lowest  concentration of SARS-CoV-2 viral copies this assay can detect is 250  copies / mL. A negative result does not preclude SARS-CoV-2 infection  and should not be used as the sole basis for treatment or other  patient management decisions.  A negative result may occur with  improper specimen collection / handling, submission of specimen other  than nasopharyngeal swab, presence of viral mutation(s) within the  areas targeted by this assay, and inadequate number of viral copies  (<250 copies / mL). A negative result must be combined with clinical  observations, patient history, and epidemiological information. If result is POSITIVE SARS-CoV-2 target nucleic acids are DETECTED. The SARS-CoV-2 RNA is generally detectable in upper and lower  respiratory specimens dur ing the acute phase of infection.  Positive  results are indicative of active infection with SARS-CoV-2.  Clinical  correlation with patient history and other diagnostic information is  necessary to determine patient infection status.  Positive results do  not rule out bacterial infection or co-infection with other viruses. If result is PRESUMPTIVE POSTIVE SARS-CoV-2 nucleic acids MAY BE PRESENT.   A presumptive positive result was obtained on the submitted specimen  and confirmed on repeat testing.  While 2019 novel coronavirus  (SARS-CoV-2) nucleic acids may be present in the submitted sample  additional confirmatory testing may be necessary for epidemiological  and / or clinical management purposes  to differentiate between  SARS-CoV-2 and other Sarbecovirus currently known to infect humans.  If clinically indicated additional testing with an alternate test  methodology [id number]) is advised. The SARS-CoV-2 RNA is generally  detectable in upper and lower respiratory sp ecimens during the acute  phase of  infection. The expected result is Negative. Fact Sheet for Patients:  StrictlyIdeas.no Fact Sheet for Healthcare Providers: BankingDealers.co.za This test is not yet approved or cleared by the [place] FDA and has been authorized for detection and/or diagnosis of SARS-CoV-2 by FDA under an Emergency Use Authorization (EUA).  This EUA will remain in effect (meaning this test can be used) for the duration of the COVID-19 declaration under Section 564(b)(1) of the Act, 21 U.S.C. section 360bbb-3(b)(1), unless the authorization is terminated or revoked sooner. Performed at [facility], [place] [address], [place], [place] [address]   APTT     Status: None   Collection Time: 04/05/19  5:50 AM  Result Value Ref Range   aPTT 31 24 - 36 seconds    Comment: Performed at [facility], [place] [address], [place], [place] [address]   Dg Knee Left Port  Result Date: 04/05/2019 CLINICAL DATA:  Fall with knee pain EXAM: PORTABLE LEFT KNEE - 1-2 VIEW COMPARISON:  11/15/2018 FINDINGS: Interval healing of proximal fibular fracture. Tibial nail with chronic fracture of the uppermost screw. Soft tissue swelling medial to the knee and at the medial calf as described on dedicated tibia study. No acute fracture or subluxation. No definite/significant joint effusion. IMPRESSION: 1. Medial soft tissue swelling, likely with large calf hematoma. 2. No acute osseous finding. Electronically Signed   By: Monte Fantasia M.D.   On: 04/05/2019 05:59   Dg Tibia/fibula Left Port  Result Date: 04/05/2019 CLINICAL DATA:  Fall with left knee injury. EXAM: PORTABLE LEFT TIBIA AND FIBULA - 2 VIEW COMPARISON:  11/15/2018 FINDINGS: Interval completed healing of the distal tibia and proximal fibular fractures. There is a tibial nail with chronic fracture of the uppermost screw. Marked ovoid swelling at the medial calf, presumably hematoma.  IMPRESSION: 1. Marked soft tissue swelling in the medial calf, presumably large hematoma. 2. Interval healing of distal tibia and proximal fibular fractures with stable hardware alignment. Electronically Signed   By: Monte Fantasia M.D.   On: 04/05/2019 05:58    Review of Systems  Constitutional: Negative.   HENT: Negative.   Eyes: Negative.   Respiratory: Negative.   Cardiovascular: Negative.   Gastrointestinal: Negative.   Musculoskeletal:       Left leg pain  Skin: Negative.   Neurological: Negative.   Psychiatric/Behavioral: Negative.     Blood pressure (!) 140/96, pulse 89, temperature 97.9 F (36.6 C), resp. rate 16, SpO2 99 %. Physical Exam  Constitutional: He appears well-developed.  HENT:  Head: Normocephalic.  Eyes: Conjunctivae are normal.  Neck: Neck supple.  Cardiovascular: Normal rate.  Respiratory: Effort normal.  GI: Soft.  Musculoskeletal:     Comments: Left leg demonstrates large area of swelling in the proximal medial area of the leg.  There is ecchymosis proximally.  There are overlying blisters and the area is tense to palpation.  It is very tender to palpation.  The swelling extends down to the level of the distal posterior compartment musculature.  The lateral compartment appears soft and there is some deformity from prior fracture of the fibula at the proximal lateral leg.  There are no lacerations.  There is tenseness to palpation in the posterior leg as well.  Patient endorses decreased sensation on the dorsal and plantar foot but does have foot deformity consistent with severe flatfoot.  I am unable to palpate the pulse but he does have a Doppler signal present of the DP and PT pulses.  Capillary  refill is intact.  Patient has pain with passive stretch of the hallux.  No evidence of other extremity injury  Neurological: He is alert.  Skin: Skin is warm.  Psychiatric: He has a normal mood and affect.     Assessment/Plan Patient seems to be developing a  compartment syndrome on the proximal medial leg in the posterior and possibly deep posterior compartments.  The lateral leg does not appear to be affected by this.  Given this the patient will have rapid coronavirus screen and be taken emergently to the operating room for compartment release.  We will plan for posterior and deep posterior compartment release and assess lateral compartments at that time.  There is no bony injury but given his history of compartment syndrome and the amount of swelling and pain he has I think this is the appropriate plan.  We discussed the risk, benefits and alternatives of surgery which include but are not limited to wound healing complication, need for delayed closure, infection, damage surrounding structures and continued pain.  We also discussed that there may already be irreversible muscle and nerve damage in these compartments given the duration since his initial injury at 5 PM last night but given that his pain is been worsening over the past couple of hours I am hopeful that release will be beneficial.  Patient will remain n.p.o.  He will be admitted to the hospital afterwards for planned delayed closure once swelling resolves.  Erle Crocker, MD 04/05/2019, 7:16 AM

## 2019-04-05 NOTE — Transfer of Care (Signed)
Immediate Anesthesia Transfer of Care Note  Patient: Antonio Oliver  Procedure(s) Performed: FASCIOTOMY LEFT LOWER LEG (Left Leg Lower) Application Of Wound Vac (Left Leg Lower) Evacuation Hematoma Left Lower Leg (Left Leg Lower)  Patient Location: PACU  Anesthesia Type:General  Level of Consciousness: sedated  Airway & Oxygen Therapy: Patient Spontanous Breathing and Patient connected to nasal cannula oxygen  Post-op Assessment: Report given to RN and Post -op Vital signs reviewed and stable  Post vital signs: Reviewed and stable  Last Vitals:  Vitals Value Taken Time  BP    Temp    Pulse 90 04/05/19 0950  Resp 9 04/05/19 0950  SpO2 100 % 04/05/19 0950  Vitals shown include unvalidated device data.  Last Pain:  Vitals:   04/05/19 1798  PainSc: 10-Worst pain ever         Complications: No apparent anesthesia complications

## 2019-04-05 NOTE — Progress Notes (Signed)
This RN delivered patient's iphone and watch to him at the bedside from his wife. Dorthey Sawyer, RN

## 2019-04-05 NOTE — ED Notes (Signed)
Report given to [facility]  

## 2019-04-05 NOTE — Plan of Care (Signed)
  Problem: Education: Goal: Knowledge of General Education information will improve Description Including pain rating scale, medication(s)/side effects and non-pharmacologic comfort measures Outcome: Progressing   

## 2019-04-05 NOTE — Progress Notes (Signed)
Remote pacemaker transmission.   

## 2019-04-05 NOTE — ED Provider Notes (Signed)
[facility] DEPT Provider Note   CSN: [id number] Arrival date & time: 04/05/19  0423     History   Chief Complaint Chief Complaint  Patient presents with  . Leg Injury    HPI Antonio Oliver is a 63 y.o. male.     The history is provided by the patient and the spouse.  Leg Pain Location:  Leg Leg location:  L leg Pain details:    Quality:  Aching   Severity:  Severe   Onset quality:  Sudden   Duration:  12 hours   Timing:  Constant   Progression:  Worsening Chronicity:  New Relieved by:  Nothing Worsened by:  Bearing weight Associated symptoms: numbness and tingling   Associated symptoms: no back pain and no fever   Patient with multiple medical conditions including chronic pain, depression, diabetes, anxiety, previous history of compartment syndrome right leg Presents with left leg pain and swelling Patient was stepping out of his pool when he slipped injuring his left knee and proximal tibia surface.  No other injuries reported.  He had immediate swelling and pain but was able to manage this. Tonight the pain became abruptly worse and swelling became worse.  He reports tingling and numbness in his left foot. Past Medical History:  Diagnosis Date  . Anxiety 10/30/2018  . Bicuspid aortic valve 12/22/2016  . BPH (benign prostatic hyperplasia)   . Bradycardia 02/01/2019  . Cervical myelopathy ([place]) 11/14/2018  . Chronic back pain 04/03/2013  . Chronic bilateral low back pain 11/17/2016  . Chronic inflammatory arthritis 11/17/2016   History of positive rheumatoid factor in the past. Denies placement on immunosuppressive therapy  . Compartment syndrome ([place]) 06/20/2013   Last Assessment & Plan:  Denies any problems with right leg since April/May Does have swelling at times if he is on this leg for long periods of time. Wears compression socks regularly and has helped with swelling.  . Complete heart block ([place]) 05/04/2017  . Depression   .  Diabetes ([place]) 10/30/2018  . Diabetes mellitus   . Diabetes mellitus due to underlying condition with unspecified complications ([place]) 2/99/3716  . Diabetes mellitus type 2 in nonobese (HCC)   . Enthesopathy of ankle and tarsus 04/04/2013   Last Assessment & Plan:  Continues with swelling after prolonged standing.  Still  Wearing support hose, which helps.  . Essential hypertension 10/30/2018  . Fatigue 10/30/2018  . FHx: migraine headaches 04/04/2013  . Fracture of capitate bone of wrist 02/26/2019  . GERD (gastroesophageal reflux disease) 10/30/2018  . Glaucoma   . Glaucoma 04/03/2013  . Heart murmur 11/17/2016  . Hemangioma of skin and subcutaneous tissue 07/07/2017  . History of kidney stones 11/17/2016  . Hyperlipidemia 10/30/2018  . Hypertension   . Hypothyroidism 10/30/2018  . Lentigo 07/07/2017  . Lesion of plantar nerve 04/04/2013  . Leukocytosis   . Migraine 11/17/2016  . Mixed dyslipidemia 10/30/2018  . Morbid obesity ([place]) 10/27/2011  . Multiple actinic keratoses 07/07/2017  . Multiple benign melanocytic nevi 07/07/2017  . Neck pain 04/04/2013  . Neoplasm of uncertain behavior of skin 07/28/2018  . Nondisplaced fracture of medial cuneiform of left foot, initial encounter for closed fracture 11/16/2018  . Nonrheumatic aortic valve stenosis 02/01/2019  . Osteoarthritis 10/30/2018  . Other seborrheic keratosis 07/07/2017  . Personal history of tobacco use, presenting hazards to health 04/04/2013  . Presence of permanent cardiac pacemaker   . S/P AVR (aortic valve replacement) 03/10/2017  . Severe  single current episode of major depressive disorder, without psychotic features ([place]) 11/17/2016  . Status post bariatric surgery 04/04/2013  . Strain of rotator cuff 04/04/2013  . Surgery, elective   . Syncope 02/14/2019  . Vitamin D deficiency 10/30/2018  . Weakness     Patient Active Problem List   Diagnosis Date Noted  . Ascending aorta dilation ([place]) 03/06/2019  . Charcot's joint of foot  03/05/2019  . Fracture of tibia 03/05/2019  . Fracture of capitate bone of wrist 02/26/2019  . Syncope 02/14/2019  . Bradycardia 02/01/2019  . Kidney stone 02/01/2019  . Nonrheumatic aortic valve stenosis 02/01/2019  . Surgery, elective   . Weakness   . Diabetes mellitus type 2 in nonobese (HCC)   . Leukocytosis   . Nondisplaced fracture of medial cuneiform of left foot, initial encounter for closed fracture 11/16/2018  . Cervical myelopathy ([place]) 11/14/2018  . Diabetes ([place]) 10/30/2018  . Hyperlipidemia 10/30/2018  . Depression 10/30/2018  . Anxiety 10/30/2018  . Vitamin D deficiency 10/30/2018  . Osteoarthritis 10/30/2018  . GERD (gastroesophageal reflux disease) 10/30/2018  . Fatigue 10/30/2018  . Hypothyroidism 10/30/2018  . Essential hypertension 10/30/2018  . Mixed dyslipidemia 10/30/2018  . Diabetes mellitus due to underlying condition with unspecified complications ([place]) 32/95/1884  . Neoplasm of uncertain behavior of skin 07/28/2018  . Hemangioma of skin and subcutaneous tissue 07/07/2017  . Lentigo 07/07/2017  . Multiple actinic keratoses 07/07/2017  . Multiple benign melanocytic nevi 07/07/2017  . Other seborrheic keratosis 07/07/2017  . Complete heart block ([place]) 05/04/2017  . S/P AVR (aortic valve replacement) 03/10/2017  . Bicuspid aortic valve 12/22/2016  . Benign prostatic hyperplasia without lower urinary tract symptoms 11/17/2016  . Chronic inflammatory arthritis 11/17/2016  . Chronic bilateral low back pain 11/17/2016  . Heart murmur 11/17/2016  . History of kidney stones 11/17/2016  . Migraine 11/17/2016  . Severe single current episode of major depressive disorder, without psychotic features ([place]) 11/17/2016  . Compartment syndrome ([place]) 06/20/2013  . Enthesopathy of ankle and tarsus 04/04/2013  . FHx: migraine headaches 04/04/2013  . Neck pain 04/04/2013  . Lesion of plantar nerve 04/04/2013  . Personal history of tobacco use, presenting hazards to  health 04/04/2013  . Status post bariatric surgery 04/04/2013  . Strain of rotator cuff 04/04/2013  . Chronic back pain 04/03/2013  . Glaucoma 04/03/2013  . Morbid obesity ([place]) 10/27/2011    Past Surgical History:  Procedure Laterality Date  . ANTERIOR CERVICAL DECOMP/DISCECTOMY FUSION N/A 11/17/2018   Procedure: CERVICAL THREE-CERVICAL FOUR, CERVICAL FOUR-CERVICAL FIVE, CERVICAL FIVE-CERVICAL SIX ANTERIOR CERVICAL DECOMPRESSION/DISCECTOMY FUSION;  Surgeon: Earnie Larsson, MD;  Location: [facility];  Service: Neurosurgery;  Laterality: N/A;  . BACK SURGERY     x4  . FOOT NEUROMA SURGERY    . GASTRIC BYPASS    . HEMORRHOID SURGERY     over 30 years ago  . HERNIA REPAIR     LIH umb  . NASAL SINUS SURGERY     x2  . NECK SURGERY          Home Medications    Prior to Admission medications   Medication Sig Start Date End Date Taking? Authorizing Provider  aspirin 81 MG tablet Take 81 mg by mouth daily.    [provider]  atorvastatin (LIPITOR) 20 MG tablet Take 20 mg by mouth daily at 6 PM.     [provider]  Brexpiprazole (REXULTI) 2 MG TABS Take 2 mg by mouth daily.  [provider]  buPROPion (WELLBUTRIN SR) 200 MG 12 hr tablet Take 200 mg by mouth 2 (two) times daily.    [provider]  busPIRone (BUSPAR) 10 MG tablet Take 20 mg by mouth 2 (two) times daily.     [provider]  calcium citrate (CALCITRATE - DOSED IN MG ELEMENTAL CALCIUM) 950 MG tablet Take 200 mg of elemental calcium by mouth daily.    [provider]  clonazePAM (KLONOPIN) 1 MG tablet TAKE 1 TABLET BY MOUTH ONCE DAILY AT BEDTIME 03/13/19   [provider]  DULoxetine (CYMBALTA) 60 MG capsule Take 60 mg by mouth 2 (two) times daily.     [provider]  Erenumab-aooe (AIMOVIG) 70 MG/ML SOAJ Inject 70 mg into the skin every 30 (thirty) days. 04/02/19   Pieter Partridge, DO  ferrous sulfate 325 (65 FE) MG tablet Take 1 tablet (325 mg total) by  mouth every other day. 02/15/19   Meccariello, Bernita Raisin, DO  fexofenadine (ALLEGRA) 180 MG tablet Take 180 mg by mouth daily.    [provider]  finasteride (PROSCAR) 5 MG tablet Take 5 mg by mouth daily.    [provider]  fluticasone (FLONASE) 50 MCG/ACT nasal spray Place 2 sprays into the nose daily. PRN    [provider]  folic acid (FOLVITE) 1 MG tablet Take 1 mg by mouth daily.    [provider]  furosemide (LASIX) 20 MG tablet Take 20 mg by mouth as needed.    [provider]  levothyroxine (SYNTHROID) 125 MCG tablet Take 125 mcg by mouth daily.     [provider]  Magnesium 500 MG CAPS Take 500 mg by mouth 2 (two) times a day.     [provider]  metFORMIN (GLUCOPHAGE) 1000 MG tablet Take 1,000 mg by mouth 2 (two) times daily with a meal.    [provider]  mirtazapine (REMERON) 30 MG tablet Take 30 mg by mouth at bedtime.     [provider]  Multiple Vitamin (MULTIVITAMIN WITH MINERALS) TABS tablet Take 1 tablet by mouth daily.    [provider]  omeprazole (PRILOSEC) 20 MG capsule Take 20 mg by mouth 2 (two) times daily before a meal.     [provider]  potassium chloride SA (K-DUR) 20 MEQ tablet Take 20 mEq by mouth as needed (when taking furosemide).    [provider]  pramipexole (MIRAPEX) 1 MG tablet Take 1 mg by mouth at bedtime.     [provider]  Semaglutide, 1 MG/DOSE, (OZEMPIC, 1 MG/DOSE,) 2 MG/1.5ML SOPN Inject 1 mg into the skin every Monday.    [provider]  silodosin (RAPAFLO) 8 MG CAPS capsule Take 8 mg by mouth daily with breakfast.    [provider]  SUMAtriptan (IMITREX) 6 MG/0.5ML SOLN injection Inject 6 mg into the skin every 2 (two) hours as needed for migraine or headache. May repeat in 2 hours if headache persists or recurs.    [provider]  zolmitriptan (ZOMIG) 5 MG tablet Take 5 mg by mouth as needed for  migraine.    [provider]    Family History Family History  Problem Relation Age of Onset  . Cancer Mother        breast  . Rheum arthritis Mother   . Diabetes Mother   . Heart disease Father   . Kidney disease Father   . Diabetes Father   .  Hypertension Brother   . Diabetes Brother   . Osteoarthritis Brother   . Heart disease Brother   . Hypertension Brother   . Diabetes Brother   . Rheum arthritis Brother   . Depression Daughter   . Anxiety disorder Daughter     Social History Social History   Tobacco Use  . Smoking status: Never Smoker  . Smokeless tobacco: Never Used  Substance Use Topics  . Alcohol use: Yes    Alcohol/week: 1.0 standard drinks    Types: 1 Glasses of wine per week    Comment: occ.  . Drug use: No     Allergies   Morphine   Review of Systems Review of Systems  Constitutional: Negative for fever.  Respiratory: Negative for cough.   Gastrointestinal: Negative for vomiting.  Musculoskeletal: Positive for arthralgias and joint swelling. Negative for back pain.  Skin: Positive for color change.  Neurological: Positive for numbness.  All other systems reviewed and are negative.    Physical Exam Updated Vital Signs BP (!) 128/98   Pulse 80   Temp 97.9 F (36.6 C)   Resp 16   SpO2 100%   Physical Exam CONSTITUTIONAL: Chronically ill-appearing HEAD: Normocephalic/atraumatic EYES: EOMI/PERRL ENMT: Mask in place NECK: supple no meningeal signs SPINE/BACK chronic cervical spine tenderness CV: S1/S2 noted LUNGS: Lungs are clear to auscultation bilaterally, no apparent distress ABDOMEN: soft, nontender NEURO: Pt is awake/alert/appropriate, moves all extremitiesx4.  History of basal toes of the left foot, reports subjective numbness to left foot EXTREMITIES: Significant tenderness and swelling to left knee and proximal tibia.  See photo.  The proximal calf is extremely firm to palpation.  Chronic deformity left foot unable to  palpate pulse on left foot SKIN: warm, color normal PSYCH: no abnormalities of mood noted, alert and oriented to situation       ED Treatments / Results  Labs (all labs ordered are listed, but only abnormal results are displayed) Labs Reviewed  CBC WITH DIFFERENTIAL/PLATELET - Abnormal; Notable for the following components:      Result Value   Hemoglobin 11.6 (*)    HCT 38.0 (*)    MCH 25.8 (*)    RDW 17.9 (*)    All other components within normal limits  SARS CORONAVIRUS 2 (HOSPITAL ORDER, [facility])  BASIC METABOLIC PANEL  PROTIME-INR  APTT    EKG None  Radiology Dg Knee Left Port  Result Date: 04/05/2019 CLINICAL DATA:  Fall with knee pain EXAM: PORTABLE LEFT KNEE - 1-2 VIEW COMPARISON:  11/15/2018 FINDINGS: Interval healing of proximal fibular fracture. Tibial nail with chronic fracture of the uppermost screw. Soft tissue swelling medial to the knee and at the medial calf as described on dedicated tibia study. No acute fracture or subluxation. No definite/significant joint effusion. IMPRESSION: 1. Medial soft tissue swelling, likely with large calf hematoma. 2. No acute osseous finding. Electronically Signed   By: Monte Fantasia M.D.   On: 04/05/2019 05:59   Dg Tibia/fibula Left Port  Result Date: 04/05/2019 CLINICAL DATA:  Fall with left knee injury. EXAM: PORTABLE LEFT TIBIA AND FIBULA - 2 VIEW COMPARISON:  11/15/2018 FINDINGS: Interval completed healing of the distal tibia and proximal fibular fractures. There is a tibial nail with chronic fracture of the uppermost screw. Marked ovoid swelling at the medial calf, presumably hematoma. IMPRESSION: 1. Marked soft tissue swelling in the medial calf, presumably large hematoma. 2. Interval healing of distal tibia and proximal fibular fractures with stable  hardware alignment. Electronically Signed   By: Monte Fantasia M.D.   On: 04/05/2019 05:58    Procedures .Critical Care Performed by:  Ripley Fraise, MD Authorized by: Ripley Fraise, MD   Critical care provider statement:    Critical care time (minutes):  50   Critical care start time:  04/05/2019 5:30 AM   Critical care end time:  04/05/2019 6:20 AM   Critical care time was exclusive of:  Separately billable procedures and treating other patients   Critical care was necessary to treat or prevent imminent or life-threatening deterioration of the following conditions:  Trauma   Critical care was time spent personally by me on the following activities:  Evaluation of patient's response to treatment, discussions with consultants, development of treatment plan with patient or surrogate, ordering and review of radiographic studies, re-evaluation of patient's condition, ordering and review of laboratory studies, ordering and performing treatments and interventions and examination of patient    Medications Ordered in ED Medications  fentaNYL (SUBLIMAZE) injection 100 mcg (100 mcg Intravenous Given 04/05/19 0544)  fentaNYL (SUBLIMAZE) injection 100 mcg (100 mcg Intravenous Given 04/05/19 2703)     Initial Impression / Assessment and Plan / ED Course  I have reviewed the triage vital signs and the nursing notes.  Pertinent labs & imaging results that were available during my care of the patient were reviewed by me and considered in my medical decision making (see chart for details).        5:21 AM Patient with rapidly spreading pain/swelling left lower leg.  Concerned about potential compartment syndrome.  Will obtain x-ray and consult orthopedics 5:42 AM Faint Pulse noted to left foot with doppler.  The foot is warm to touch. X-rays pending.  Will consult [facility] 6:01 AM Patient's orthopedic surgeon not available.  I discussed the case with Dr. Lucia Gaskins with [facility].  He will see the patient 6:21 AM Patient reports continued pain. X-rays negative for acute bony injury.  Does reveal large  hematoma. 6:53 AM Patient seen by Dr. Lucia Gaskins with orthopedics. Patient will be emergently transferred to [facility] for release of compartment syndrome.  Final Clinical Impressions(s) / ED Diagnoses   Final diagnoses:  Pain  Contusion of left lower extremity, initial encounter  Traumatic compartment syndrome of left lower extremity, initial encounter [facility])    ED Discharge Orders    None       Ripley Fraise, MD 04/05/19 [phone]

## 2019-04-05 NOTE — Op Note (Signed)
Antonio Oliver male 63 y.o. 04/05/2019  PreOperative Diagnosis: Left leg medial compartment syndrome  PostOperative Diagnosis: Left leg compartment syndrome of the posterior and deep posterior compartments Left leg deep hematoma   PROCEDURE: Left leg 2 compartment fasciotomy Evacuation of deep hematoma Application of incisional wound VAC  SURGEON: Melony Overly, MD  ASSISTANT: RNFA  ANESTHESIA: General endotracheal tube anesthesia  FINDINGS: Large deep hematoma consisting of about 200 cc of liquid and coagulated blood Concerning findings for posterior and deep posterior compartment compartment syndrome Several small extra fascial and subfascial arterial bleeders in the proximal medial leg  IMPLANTS: None  INDICATIONS:63 y.o. male with a complex medical history including heart disease and diabetes and prior history of right lower extremity compartment syndrome status post 4 compartment fasciotomy fell in his pool yesterday afternoon.  He sustained a small hematoma on the proximal medial leg.  He woke up early this morning with continued swelling and worsening pain of his proximal medial leg.  He was taken to the emergency department with concern for compartment syndrome.  Upon evaluation by the emergency physician there was concern for developing compartment syndrome and therefore orthopedics was consulted.  X-rays were negative for any acute fracture but did demonstrate prior placement of an IM nail for a tibia and fibula fracture that had healed.  Upon my evaluation patient had significant pain in the proximal medial leg with tense swelling.  There were overlying fracture blisters.  He had pain with passive stretch of his hallux and given his history of prior compartment syndrome it was decided to proceed with emergent fasciotomy.  We discussed the risk, benefits alternatives surgery which include wound healing complications, infection,, continued pain and need for further  surgery.  We also discussed the difficulty with determining the time at which is compartment syndrome may have said in an the development of muscle necrosis and nerve damage.  After weighing these risks he opted proceed.  PROCEDURE: Patient was identified the preoperative holding area.  The left leg was marked by myself.  Consent was signed myself and the patient.  He was seen by anesthesia and taken the operative suite placed supine the operative table.  General endotracheal tube anesthesia was induced without difficulty.  Thigh tourniquet was placed on the left thigh.  Preoperative antibiotics were given.  Left lower extremity was prepped and draped in usual sterile fashion and timeout was performed.  We began by making a longitudinal incision overlying the area of swelling.  This was taken sharply down through skin and subcutaneous tissue.  The fascia was reached and incised and there was a large rush of coagulated and liquid blood hematoma.  There was approximately 200 cc of hematoma that tracked within this deep space posteriorly about the leg and distal as well as proximal up to the level of the knee.  This was evacuated and then the wound was irrigated.  The muscle tissue appeared to be compressed and therefore the posterior compartment fascia was identified and mobilized.  Then a fasciotomy was performed of the posterior compartment.  The muscle did not bulge out through the tissue but there was some hematoma within the compartment that was evacuated.  Then the posterior medial tibial border was identified and the soleus bridge was mobilized.  Then the deep compartment was identified and the fascial tissue was mobilized with scissors and a fasciotomy was performed.  There was some hematoma within this area as well.  This was evacuated.  The muscle was not bulging  through the fascia.  Then after irrigating the wound a second time the proximal portion of the wound was inspected and there was found to be  several active arterial bleeders that were clamped with hemostat and ultimately cauterized.  The incision was extended proximally to ensure adequate hemostasis.  Direct compression was used as well as Bovie cautery.  Then the wound was again irrigated and found to be with adequate hemostasis.  The remaining skin created a large cavity at the site of the hematoma and therefore decision was made to use a incisional wound VAC.  There was 0 tension on the skin and therefore 2-0 Monocryl stitch was used to close the subcuticular layer and 2-0 nylon was used to close the skin in a loose fashion.  A long incisional wound VAC was placed on the wound and had good suction when placed to the [facility] machine.  Then a sterile sheet cotton and Ace wrap was placed.    The counts were correct at the end the case.  There were no complications.  Patient tolerated procedure well.  He was awakened from anesthesia and taken recovery in stable condition.  POST OPERATIVE INSTRUCTIONS: Patient will be admitted to observation.  We will monitor for VAC output and reaccumulation of his hematoma. If all goes well we will plan for discharge home tomorrow after [facility] removal. For now he will remain in bed with bathroom privileges. We will hold off on DVT prophylaxis as he has a high risk of bleeding. We will use an SCD on the right leg He will receive postoperative antibiotics Regular diet We will consult medicine for medical management while admitted due to comorbidities.  TOURNIQUET TIME: No tourniquet was used  BLOOD LOSS:  There is approximately 200 to 250 cc of hematoma upon initial evacuation.  Surgical blood loss was approximately 25 cc.         DRAINS: none         SPECIMEN: none       COMPLICATIONS:  * No complications entered in OR log *         Disposition: PACU - hemodynamically stable.         Condition: stable

## 2019-04-05 NOTE — ED Triage Notes (Signed)
Pt BIB [facility] from home. On 7/29, pt was getting out of the pool, slipped on the ladder and hit left knee and foot. There is obvious swelling in both the left knee and left foot. Pt does take 81mg  Asprin every morning. Pt took Trazodone and Percocet at 0300.

## 2019-04-05 NOTE — Anesthesia Preprocedure Evaluation (Addendum)
Anesthesia Evaluation  Patient identified by MRN, date of birth, ID band Patient awake    Reviewed: Allergy & Precautions, NPO status , Patient's Chart, lab work & pertinent test results  History of Anesthesia Complications Negative for: history of anesthetic complications  Airway Mallampati: II  TM Distance: >3 FB Neck ROM: Limited    Dental  (+) Dental Advisory Given, Teeth Intact   Pulmonary neg pulmonary ROS,    breath sounds clear to auscultation       Cardiovascular hypertension, Pt. on medications + dysrhythmias + pacemaker + Valvular Problems/Murmurs  Rhythm:Regular Rate:Normal   '20 TTE - EF >65%. There is mildly increased left ventricular wall thickness. Mitral valve regurgitation is mild to moderate by color flow. A bioprosthesis valve is present in the aortic position. Normal aortic valve prosthesis.    Neuro/Psych  Headaches, PSYCHIATRIC DISORDERS Anxiety Depression  S/p cervical fusion multiple levels     GI/Hepatic Neg liver ROS, GERD  Medicated, S/p gastric bypass    Endo/Other  diabetes, Well Controlled, Type 2, Oral Hypoglycemic AgentsHypothyroidism   Renal/GU      Musculoskeletal  (+) Arthritis , Osteoarthritis,    Abdominal   Peds  Hematology  (+) anemia ,   Anesthesia Other Findings   Reproductive/Obstetrics                            Anesthesia Physical Anesthesia Plan  ASA: III  Anesthesia Plan: General   Post-op Pain Management:    Induction: Intravenous  PONV Risk Score and Plan: 2 and Treatment may vary due to age or medical condition, Ondansetron, Midazolam and Dexamethasone  Airway Management Planned: Oral ETT  Additional Equipment: None  Intra-op Plan:   Post-operative Plan: Extubation in OR  Informed Consent: I have reviewed the patients History and Physical, chart, labs and discussed the procedure including the risks, benefits and  alternatives for the proposed anesthesia with the patient or authorized representative who has indicated his/her understanding and acceptance.     Dental advisory given  Plan Discussed with: CRNA and Anesthesiologist  Anesthesia Plan Comments: ( Will have glidescope in room given limited cervical ROM )       Anesthesia Quick Evaluation

## 2019-04-05 NOTE — ED Notes (Signed)
[facility] called for transport 

## 2019-04-06 ENCOUNTER — Encounter (HOSPITAL_COMMUNITY): Payer: Self-pay | Admitting: Orthopaedic Surgery

## 2019-04-06 LAB — GLUCOSE, CAPILLARY
Glucose-Capillary: 207 mg/dL — ABNORMAL HIGH (ref 70–99)
Glucose-Capillary: 127 mg/dL — ABNORMAL HIGH (ref 70–99)
Glucose-Capillary: 167 mg/dL — ABNORMAL HIGH (ref 70–99)

## 2019-04-06 MED ORDER — HYDROCODONE-ACETAMINOPHEN 5-325 MG PO TABS: 1.0000 | ORAL_TABLET | ORAL | 0 refills | Status: AC | PRN

## 2019-04-06 NOTE — Evaluation (Signed)
Physical Therapy Evaluation Patient Details Name: DEQUANTE TREMAINE MRN: [id number] DOB: 14-Jan-1956 Today's Date: 04/06/2019   History of Present Illness  Park is a 63 year old male who fell in his pool yesterday evening around 5 PM.  He had some area of ecchymosis on the proximal medial leg.  This morning he woke up to significant pain and increased swelling of his left leg.  He was brought to the emergency department given that he has had a history of contralateral leg compartment syndrome requiring 4 compartment release.  He states the pain in his left leg was similar to that pain.  Patient has a history of prior tibia and fibula fracture and IM nailing.  Patient states after IM nailing of his left leg he developed Charcot change in his left foot.  Patient does have diabetes with an A1c in the 5 range.  Patient denies a history of neuropathy.Left leg 2 compartment fasciotomyEvacuation of deep hematomaApplication of incisional wound VAC  Clinical Impression   Pt presents with decreased mobility affecting his Independence secondary to the above diagnosis. Pt is eager to d/c home today. Pt demonstrated good safe technique with ambulation using a RW. Pt reports he will have assistance from his wife at d/c. Pt is currently min guard assist with ambulation and educated pt on basic LE exercises. Pt reviewed step technique and all education is complete. Pt agreeable to continue HEP and ambulation with RW. Pt is d/c from PT services and is ready for d/c home.    Follow Up Recommendations No PT follow up    Equipment Recommendations  None recommended by PT    Recommendations for Other Services       Precautions / Restrictions Precautions Precautions: Other (comment) Precaution Comments: vac Restrictions Other Position/Activity Restrictions: WBAT      Mobility  Bed Mobility Overal bed mobility: Modified Independent                Transfers Overall transfer level: Needs  assistance Equipment used: Rolling walker (2 wheeled) Transfers: Sit to/from Stand Sit to Stand: Min guard         General transfer comment: cues for hand placement  Ambulation/Gait Ambulation/Gait assistance: Min guard Gait Distance (Feet): 120 Feet Assistive device: Rolling walker (2 wheeled) Gait Pattern/deviations: Step-through pattern;Decreased stride length Gait velocity: decreased   General Gait Details: ambulated with wound vac, no LOB and pt very cautious  Stairs Stairs: (pt declined demonstration, we did verbally review technique)          Wheelchair Mobility    Modified Rankin (Stroke Patients Only)       Balance Overall balance assessment: No apparent balance deficits (not formally assessed)                                           Pertinent Vitals/Pain Pain Assessment: 0-10 Pain Score: 4  Pain Location: Left leg Pain Descriptors / Indicators: Discomfort Pain Intervention(s): Limited activity within patient's tolerance;Monitored during session    Home Living Family/patient expects to be discharged to:: Private residence Living Arrangements: Spouse/significant other Available Help at Discharge: Family Type of Home: House Home Access: Stairs to enter Entrance Stairs-Rails: Left Entrance Stairs-Number of Steps: 3-4 Home Layout: Two level Home Equipment: Bedside commode;Grab bars - tub/shower;Grab bars - toilet;Walker - 2 wheels;Cane - single point;Wheelchair - [employer] - 4 wheels      Prior Function Level  of Independence: Independent with assistive device(s)         Comments: walking stick     Hand Dominance        Extremity/Trunk Assessment   Upper Extremity Assessment Upper Extremity Assessment: Defer to OT evaluation    Lower Extremity Assessment Lower Extremity Assessment: Overall WFL for tasks assessed    Cervical / Trunk Assessment Cervical / Trunk Assessment: Other exceptions Cervical / Trunk  Exceptions: h/o cervical fusion, neck maintained in cervical flexion with rotation to left  Communication   Communication: No difficulties  Cognition Arousal/Alertness: Awake/alert Behavior During Therapy: WFL for tasks assessed/performed Overall Cognitive Status: Within Functional Limits for tasks assessed                                 General Comments: edema left foot      General Comments      Exercises General Exercises - Lower Extremity Ankle Circles/Pumps: AROM;Strengthening;Left;10 reps;Supine Hip ABduction/ADduction: AROM;Strengthening;Left;10 reps;Supine Straight Leg Raises: AROM;Strengthening;Left;10 reps;Supine   Assessment/Plan    PT Assessment Patent does not need any further PT services  PT Problem List         PT Treatment Interventions      PT Goals (Current goals can be found in the Care Plan section)  Acute Rehab PT Goals Patient Stated Goal: to d/c home today    Frequency     Barriers to discharge        Co-evaluation               AM-PAC PT "6 Clicks" Mobility  Outcome Measure Help needed turning from your back to your side while in a flat bed without using bedrails?: None Help needed moving from lying on your back to sitting on the side of a flat bed without using bedrails?: None Help needed moving to and from a bed to a chair (including a wheelchair)?: A Little Help needed standing up from a chair using your arms (e.g., wheelchair or bedside chair)?: A Little Help needed to walk in hospital room?: A Little Help needed climbing 3-5 steps with a railing? : A Little 6 Click Score: 20    End of Session Equipment Utilized During Treatment: Gait belt Activity Tolerance: Patient tolerated treatment well Patient left: in bed;with call bell/phone within reach Nurse Communication: Mobility status PT Visit Diagnosis: Difficulty in walking, not elsewhere classified (R26.2)    Time: 5974-1638 PT Time Calculation (min) (ACUTE  ONLY): 39 min   Charges:   PT Evaluation $PT Eval Low Complexity: 1 Low PT Treatments $Gait Training: 8-22 mins        Theodoro Grist, PT  Lelon Mast 04/06/2019, 12:42 PM

## 2019-04-06 NOTE — Anesthesia Postprocedure Evaluation (Signed)
Anesthesia Post Note  Patient: Antonio Oliver  Procedure(s) Performed: FASCIOTOMY LEFT LOWER LEG (Left Leg Lower) Application Of Wound Vac (Left Leg Lower) Evacuation Hematoma Left Lower Leg (Left Leg Lower)     Patient location during evaluation: PACU Anesthesia Type: General Level of consciousness: awake and alert Pain management: pain level controlled Vital Signs Assessment: post-procedure vital signs reviewed and stable Respiratory status: spontaneous breathing, nonlabored ventilation and respiratory function stable Cardiovascular status: blood pressure returned to baseline and stable Postop Assessment: no apparent nausea or vomiting Anesthetic complications: no    Last Vitals:  Vitals:   04/06/19 0410 04/06/19 0807  BP: (!) 150/94 (!) 151/97  Pulse: 98 94  Resp: 20 18  Temp: 37.1 C 36.8 C  SpO2: 100% 100%    Last Pain:  Vitals:   04/06/19 0807  TempSrc: Oral  PainSc:    Pain Goal:                   [name]

## 2019-04-06 NOTE — Progress Notes (Signed)
DISCHARGE NOTE HOME Antonio Oliver to be discharged home per MD order. Discussed prescriptions and follow up appointments with the patient. Prescriptions given to patient; medication list explained in detail. Patient verbalized understanding.  Skin clean, dry and intact without evidence of skin break down, no evidence of skin tears noted. IV catheter discontinued intact. Site without signs and symptoms of complications. Dressing and pressure applied. Pt denies pain at the site currently. No complaints noted.  Wound vac dressing removed, gauze and ace wrap applied.  Patient free of lines, drains, and wounds.   An After Visit Summary (AVS) was printed and given to the patient. Patient escorted via wheelchair, and discharged home via private auto.  Stephan Minister, RN

## 2019-04-06 NOTE — Discharge Instructions (Signed)
Acute Compartment Syndrome  Compartment syndrome is a painful condition that occurs when swelling and pressure build up in a body space (compartment) of the arms or legs. Groups of muscles, nerves, and blood vessels in the arms and legs are separated into various compartments. Each compartment is surrounded by tough layers of tissue (fascia). In compartment syndrome, pressure builds up within the layers of fascia and begins to push on the structures within that compartment. In acute compartment syndrome, the pressure builds up suddenly, often as the result of an injury. If pressure continues to increase, it can block the flow of blood in the smallest blood vessels (capillaries). Then the muscles in the compartment cannot get enough oxygen and nutrients and will start to die within 4-6 hours. The nerves will begin to die within 12-24 hours. This condition is a medical emergency that must be treated with surgery. What are the causes? This condition may be caused by:  Injury. Some injuries can cause swelling or bleeding in a compartment. This can lead to compartment syndrome. Injuries that may cause this problem include: ? Broken bones, especially the long bones of the arms and legs. ? Crushing injuries. ? Penetrating injuries, such as a knife wound. ? Badly bruised muscles. ? Poisonous bites, such as a snake bite. ? Severe burns.  Blocked blood flow. This could be a result of: ? A cast or bandage that is too tight. ? A surgical procedure. Blood flow sometimes has to be stopped for a while during a surgery, usually with a tourniquet. ? Lying for too long in a position that restricts blood flow. This can happen in people who have nerve damage or if a person is unconscious for a long time. ? Medicines used to build up muscles (anabolic steroids). ? Medicines that keep the blood from forming clots (blood thinners). What are the signs or symptoms? The most common symptom of this condition is pain. The  pain:  May be far more severe than it should be for the injury you have.  May get worse: ? When moving or stretching the affected body part. ? When the area is pushed or squeezed. ? When raising (elevating) affected body part above the level of the heart.  May come with a feeling of tingling or burning.  May not get better when you take pain medicine. Other symptoms include:  A feeling of tightness or fullness in the affected area.  A loss of feeling.  Weakness in the area.  Loss of movement.  Skin becoming pale, tight, and shiny over the painful area.  Warmth and tenderness.  Tensing when the affected area is touched. How is this diagnosed? This condition may be diagnosed based on:  Your physical exam and symptoms.  Measuring the pressure in the affected area (compartment pressure measurement).  Tests to rule out other problems, such as: ? X-rays. ? Blood tests. ? Ultrasound. How is this treated? Treatment for this condition uses a procedure called fasciotomy. In this procedure, incisions are made through the fascia to relieve the pressure in the compartment and to prevent permanent damage. Before the surgery, first-aid treatment is done, which may include:  Treating any injury.  Loosening or removing any cast, bandage, or external wrap that may be causing pain.  Elevating the painful arm or leg to the same level as the heart.  Giving oxygen.  Giving fluids through an IV tube.  Pain medicine. Summary  Compartment syndrome occurs when swelling and pressure build up in a  body space (compartment) of the arms or legs.  First aid treatment may include loosening or removing a cast, bandage, or wrap and elevating the painful arm or leg at the level of the heart.  In acute compartment syndrome, the pressure builds up suddenly, often as the result of an injury.  This condition is a medical emergency that must be treated with a surgical procedure called fasciotomy.  This procedure relieves the pressure and prevents permanent damage. This information is not intended to replace advice given to you by your health care provider. Make sure you discuss any questions you have with your health care provider. Document Released: 08/11/2009 Document Revised: 08/05/2017 Document Reviewed: 08/12/2016 Elsevier Patient Education  2020 [employer].

## 2019-04-06 NOTE — Progress Notes (Signed)
Antonio Oliver is a 63 y.o. male   Orthopaedic diagnosis:Left leg deep hematoma and compartment syndrome  Subjective: He is doing well today.  Pain has improved since surgery.  He has tolerated the wound vac.  Denies any increased pain or numbness or tingling in left foot. No fevers or chills.   Objectyive: Vitals:   04/05/19 2324 04/06/19 0410  BP: (!) 136/91 (!) 150/94  Pulse: 98 98  Resp: 17 20  Temp: 99.2 F (37.3 C) 98.8 F (37.1 C)  SpO2: 100% 100%     Exam: Awake and alert Respirations even and unlabored No acute distress  Left leg with dressing in place.  Vac is functional. He is able to wiggle toes.  No pain with passive stretch of hallux.  Endorses sensation to light touch grossly about dorsal and plantar foot.   Assessment: POD 1 left leg hematoma evacuation and 2 compartment release.    Plan: Patient seems to be doing well today.  He would like to go home if possible.  I think this is reasonable.  VAC seems to have put out about 150 cc since surgery.  We will have him mobilize with physical therapy to ensure that he does okay and that the hematoma does not reaccumulate.  If he does well with physical therapy will likely be suitable for discharge later today after VAC removal.  Will await physical therapy recommendations for DME.  He is weightbearing as tolerated on the left lower extremity Finished perioperative antibiotics Continue pain control Avoid chemoprophylaxis for DVT due to bleeding risks Continue SCD on the right lower extremity    Antonio Journey, MD

## 2019-04-06 NOTE — Discharge Summary (Signed)
Patient ID: Antonio Oliver MRN: [id number] DOB/AGE: 05/16/1956 63 y.o.  Admit date: 04/05/2019 Discharge date: 04/06/2019  Admission Diagnoses:  Active Problems:   Compartment syndrome [facility])   Discharge Diagnoses:  Same  Past Medical History:  Diagnosis Date  . Anxiety 10/30/2018  . Bicuspid aortic valve 12/22/2016  . BPH (benign prostatic hyperplasia)   . Bradycardia 02/01/2019  . Cervical myelopathy ([place]) 11/14/2018  . Chronic back pain 04/03/2013  . Chronic bilateral low back pain 11/17/2016  . Chronic inflammatory arthritis 11/17/2016   History of positive rheumatoid factor in the past. Denies placement on immunosuppressive therapy  . Compartment syndrome ([place]) 06/20/2013   Last Assessment & Plan:  Denies any problems with right leg since April/May Does have swelling at times if he is on this leg for long periods of time. Wears compression socks regularly and has helped with swelling.  . Complete heart block ([place]) 05/04/2017  . Depression   . Diabetes ([place]) 10/30/2018  . Diabetes mellitus   . Diabetes mellitus due to underlying condition with unspecified complications ([place]) 4/48/1856  . Diabetes mellitus type 2 in nonobese (H[facility])   . Enthesopathy of ankle and tarsus 04/04/2013   Last Assessment & Plan:  Continues with swelling after prolonged standing.  Still  Wearing support hose, which helps.  . Essential hypertension 10/30/2018  . Fatigue 10/30/2018  . FHx: migraine headaches 04/04/2013  . Fracture of capitate bone of wrist 02/26/2019  . GERD (gastroesophageal reflux disease) 10/30/2018  . Glaucoma   . Glaucoma 04/03/2013  . Heart murmur 11/17/2016  . Hemangioma of skin and subcutaneous tissue 07/07/2017  . History of kidney stones 11/17/2016  . Hyperlipidemia 10/30/2018  . Hypertension   . Hypothyroidism 10/30/2018  . Lentigo 07/07/2017  . Lesion of plantar nerve 04/04/2013  . Leukocytosis   . Migraine 11/17/2016  . Mixed dyslipidemia 10/30/2018  . Morbid obesity ([place]) 10/27/2011  .  Multiple actinic keratoses 07/07/2017  . Multiple benign melanocytic nevi 07/07/2017  . Neck pain 04/04/2013  . Neoplasm of uncertain behavior of skin 07/28/2018  . Nondisplaced fracture of medial cuneiform of left foot, initial encounter for closed fracture 11/16/2018  . Nonrheumatic aortic valve stenosis 02/01/2019  . Osteoarthritis 10/30/2018  . Other seborrheic keratosis 07/07/2017  . Personal history of tobacco use, presenting hazards to health 04/04/2013  . Presence of permanent cardiac pacemaker   . S/P AVR (aortic valve replacement) 03/10/2017  . Severe single current episode of major depressive disorder, without psychotic features ([place]) 11/17/2016  . Status post bariatric surgery 04/04/2013  . Strain of rotator cuff 04/04/2013  . Surgery, elective   . Syncope 02/14/2019  . Vitamin D deficiency 10/30/2018  . Weakness     Surgeries: Procedure(s): FASCIOTOMY LEFT LOWER LEG Application Of Wound Vac Evacuation Hematoma Left Lower Leg on 04/05/2019   Consultants:   Discharged Condition: Improved  Hospital Course: Antonio Oliver is an 63 y.o. male who was admitted 04/05/2019 for operative treatment of deep left proximal leg hematoma with concern for compartment syndrome that was developing after a fall in his pool. Patient has severe unremitting pain that affects sleep, daily activities, and work/hobbies. After pre-op clearance the patient was taken to the operating room on 04/05/2019 and underwent  Procedure(s): FASCIOTOMY LEFT LOWER LEG Application Of Wound Vac Evacuation Hematoma Left Lower Leg.    He was admitted for observation postoperatively.  Were able to get the wound closed postoperatively and he was placed in an incisional wound VAC.  On postop  day 1 he was seen by physical therapy and was able to mobilize well.  He did have some difficulty mobilizing but therapy cleared him for discharge home.  Patient had significant decrease in his pain symptoms.  The wound VAC did not have  significant output.  Patient was requesting discharge home and he was suitable for discharge home.    Patient was given perioperative antibiotics:  Anti-infectives (From admission, onward)   Start     Dose/Rate Route Frequency Ordered Stop   04/05/19 1400  ceFAZolin (ANCEF) IVPB 2g/100 mL premix     2 g 200 mL/hr over 30 Minutes Intravenous Every 6 hours 04/05/19 1212 04/06/19 0326   04/05/19 0815  ceFAZolin (ANCEF) IVPB 2g/100 mL premix     2 g 200 mL/hr over 30 Minutes Intravenous On call to O.R. 04/05/19 [id number] 04/05/19 0851   04/05/19 0813  ceFAZolin (ANCEF) 2-4 GM/100ML-% IVPB    Note to Pharmacy: Cordelia Pen   : cabinet override      04/05/19 0813 04/05/19 1275       Patient was given sequential compression devices, early ambulation, and chemoprophylaxis to prevent DVT.  Patient benefited maximally from hospital stay and there were no complications.    Recent vital signs:  Patient Vitals for the past 24 hrs:  BP Temp Temp src Pulse Resp SpO2  04/06/19 0807 (!) 151/97 98.3 F (36.8 C) Oral 94 18 100 %  04/06/19 0410 (!) 150/94 98.8 F (37.1 C) Oral 98 20 100 %  04/05/19 2324 (!) 136/91 99.2 F (37.3 C) Oral 98 17 100 %  04/05/19 2027 126/85 98.7 F (37.1 C) Oral 96 18 99 %  04/05/19 1500 134/87 99 F (37.2 C) Oral (!) 105 18 99 %     Recent laboratory studies:  Recent Labs    04/05/19 0550  WBC 9.0  HGB 11.6*  HCT 38.0*  PLT 207  NA 140  K 4.2  CL 109  CO2 24  BUN 14  CREATININE 0.86  GLUCOSE 125*  INR 1.1  CALCIUM 8.7*     Discharge Medications:   Allergies as of 04/06/2019      Reactions   Morphine Itching   Can take with benadry, facial and nose itching      Medication List    TAKE these medications   Aimovig 70 MG/ML Soaj Generic drug: Erenumab-aooe Inject 70 mg into the skin every 30 (thirty) days.   aspirin 81 MG tablet Take 81 mg by mouth daily.   atorvastatin 20 MG tablet Commonly known as: LIPITOR Take 20 mg by mouth daily  at 6 PM.   buPROPion 200 MG 12 hr tablet Commonly known as: WELLBUTRIN SR Take 200 mg by mouth 2 (two) times daily.   busPIRone 10 MG tablet Commonly known as: BUSPAR Take 20 mg by mouth 2 (two) times daily.   calcium citrate 950 MG tablet Commonly known as: CALCITRATE - dosed in mg elemental calcium Take 200 mg of elemental calcium by mouth daily.   clonazePAM 1 MG tablet Commonly known as: KLONOPIN Take 1 mg by mouth 2 (two) times daily as needed for anxiety.   clonazePAM 1 MG tablet Commonly known as: KLONOPIN Take 1 mg by mouth at bedtime.   DULoxetine 60 MG capsule Commonly known as: CYMBALTA Take 60 mg by mouth 2 (two) times daily.   ferrous sulfate 325 (65 FE) MG tablet Take 1 tablet (325 mg total) by mouth every other day. What changed: when  to take this   fexofenadine 180 MG tablet Commonly known as: ALLEGRA Take 180 mg by mouth daily.   finasteride 5 MG tablet Commonly known as: PROSCAR Take 5 mg by mouth daily.   fluticasone 50 MCG/ACT nasal spray Commonly known as: FLONASE Place 2 sprays into the nose daily. PRN   folic acid 1 MG tablet Commonly known as: FOLVITE Take 1 mg by mouth daily.   furosemide 20 MG tablet Commonly known as: LASIX Take 20 mg by mouth as needed (for leg swelling).   gabapentin 600 MG tablet Commonly known as: NEURONTIN Take 600-1,200 mg by mouth 3 (three) times daily.   HYDROcodone-acetaminophen 5-325 MG tablet Commonly known as: NORCO/VICODIN Take 1-2 tablets by mouth every 4 (four) hours as needed for up to 7 days for moderate pain (pain score 4-6).   levothyroxine 125 MCG tablet Commonly known as: SYNTHROID Take 125 mcg by mouth daily.   Magnesium 500 MG Caps Take 500 mg by mouth 2 (two) times a day.   metFORMIN 1000 MG tablet Commonly known as: GLUCOPHAGE Take 1,000 mg by mouth 2 (two) times daily with a meal.   mirtazapine 30 MG tablet Commonly known as: REMERON Take 30 mg by mouth at bedtime.    multivitamin with minerals Tabs tablet Take 1 tablet by mouth daily.   omeprazole 20 MG capsule Commonly known as: PRILOSEC Take 20 mg by mouth 2 (two) times daily before a meal.   Ozempic (1 MG/DOSE) 2 MG/1.5ML Sopn Generic drug: Semaglutide (1 MG/DOSE) Inject 1 mg into the skin every Monday.   potassium chloride SA 20 MEQ tablet Commonly known as: K-DUR Take 20 mEq by mouth as needed (when taking furosemide).   pramipexole 1 MG tablet Commonly known as: MIRAPEX Take 1 mg by mouth at bedtime.   Rapaflo 8 MG Caps capsule Generic drug: silodosin Take 8 mg by mouth daily with breakfast.   Rexulti 2 MG Tabs Generic drug: Brexpiprazole Take 2 mg by mouth daily.   SUMAtriptan 6 MG/0.5ML Soln injection Commonly known as: IMITREX Inject 6 mg into the skin every 2 (two) hours as needed for migraine or headache. May repeat in 2 hours if headache persists or recurs.   tiZANidine 4 MG capsule Commonly known as: ZANAFLEX Take 4-8 mg by mouth 3 (three) times daily as needed for muscle spasms.   Zomig 5 MG tablet Generic drug: zolmitriptan Take 5 mg by mouth as needed for migraine.       Diagnostic Studies: Dg Knee Left Port  Result Date: 04/05/2019 CLINICAL DATA:  Fall with knee pain EXAM: PORTABLE LEFT KNEE - 1-2 VIEW COMPARISON:  11/15/2018 FINDINGS: Interval healing of proximal fibular fracture. Tibial nail with chronic fracture of the uppermost screw. Soft tissue swelling medial to the knee and at the medial calf as described on dedicated tibia study. No acute fracture or subluxation. No definite/significant joint effusion. IMPRESSION: 1. Medial soft tissue swelling, likely with large calf hematoma. 2. No acute osseous finding. Electronically Signed   By: Monte Fantasia M.D.   On: 04/05/2019 05:59   Dg Tibia/fibula Left Port  Result Date: 04/05/2019 CLINICAL DATA:  Fall with left knee injury. EXAM: PORTABLE LEFT TIBIA AND FIBULA - 2 VIEW COMPARISON:  11/15/2018 FINDINGS:  Interval completed healing of the distal tibia and proximal fibular fractures. There is a tibial nail with chronic fracture of the uppermost screw. Marked ovoid swelling at the medial calf, presumably hematoma. IMPRESSION: 1. Marked soft tissue swelling in the medial  calf, presumably large hematoma. 2. Interval healing of distal tibia and proximal fibular fractures with stable hardware alignment. Electronically Signed   By: Monte Fantasia M.D.   On: 04/05/2019 05:58    Disposition: Discharge disposition: 01-Home or Self Care       Patient is weightbearing as tolerated to left lower extremity He will follow-up with me in 2 weeks for a wound check. He will call my office with any concerns prior to follow-up.     Signed: Erle Crocker 04/06/2019, 2:04 PM

## 2019-04-18 ENCOUNTER — Telehealth: Payer: Self-pay | Admitting: Neurology

## 2019-04-18 NOTE — Telephone Encounter (Signed)
Wife is calling in about the Aimovig injection and wanting to check the status of the PA. Please send to the pharm on file. Thanks!

## 2019-04-20 NOTE — Telephone Encounter (Signed)
Called and advised of approval of Aimovig. No answer, LMOVM

## 2019-04-20 NOTE — Progress Notes (Signed)
Submitted online Key: [id number]   [facility] is reviewing your PA request. Typically an electronic response will be received within 72 hours. To check for an update later, open this request from your dashboard. You may close this dialog and return to your dashboard to perform other tasks.  Request Reference Number: [id number]. AIMOVIG INJ 70MG /ML is approved through 09/06/2019. For further questions, call [phone]  Faxed copy of approval to  [facility] in [place]

## 2019-04-23 ENCOUNTER — Telehealth: Payer: Self-pay | Admitting: Neurology

## 2019-04-23 MED ORDER — AIMOVIG 70 MG/ML ~~LOC~~ SOAJ: 70.0000 mg | SUBCUTANEOUS | 11 refills | Status: DC

## 2019-04-23 NOTE — Telephone Encounter (Signed)
Called and advised Antonio Oliver the Rx has been sent

## 2019-04-23 NOTE — Telephone Encounter (Signed)
Wife is calling in again about the medication. Thanks!

## 2019-04-23 NOTE — Telephone Encounter (Signed)
Wife is saying that the Aimovig to [facility] in [place] will need to be sent again. They did not get it. Thanks!

## 2019-05-01 ENCOUNTER — Ambulatory Visit: Payer: Medicare Other | Admitting: Cardiology

## 2019-05-11 ENCOUNTER — Ambulatory Visit: Payer: Medicare Other | Admitting: Cardiology

## 2019-06-11 DIAGNOSIS — F419 Anxiety disorder, unspecified: Secondary | ICD-10-CM

## 2019-06-11 DIAGNOSIS — I10 Essential (primary) hypertension: Secondary | ICD-10-CM

## 2019-06-11 DIAGNOSIS — E119 Type 2 diabetes mellitus without complications: Secondary | ICD-10-CM

## 2019-06-11 DIAGNOSIS — M71121 Other infective bursitis, right elbow: Secondary | ICD-10-CM

## 2019-06-11 DIAGNOSIS — I2510 Atherosclerotic heart disease of native coronary artery without angina pectoris: Secondary | ICD-10-CM

## 2019-06-11 DIAGNOSIS — E039 Hypothyroidism, unspecified: Secondary | ICD-10-CM

## 2019-06-12 DIAGNOSIS — M71121 Other infective bursitis, right elbow: Secondary | ICD-10-CM | POA: Diagnosis not present

## 2019-06-12 DIAGNOSIS — I2510 Atherosclerotic heart disease of native coronary artery without angina pectoris: Secondary | ICD-10-CM | POA: Diagnosis not present

## 2019-06-12 DIAGNOSIS — I10 Essential (primary) hypertension: Secondary | ICD-10-CM | POA: Diagnosis not present

## 2019-06-12 DIAGNOSIS — F419 Anxiety disorder, unspecified: Secondary | ICD-10-CM | POA: Diagnosis not present

## 2019-06-13 DIAGNOSIS — M71121 Other infective bursitis, right elbow: Secondary | ICD-10-CM | POA: Diagnosis not present

## 2019-06-13 DIAGNOSIS — L0390 Cellulitis, unspecified: Secondary | ICD-10-CM

## 2019-06-13 DIAGNOSIS — F419 Anxiety disorder, unspecified: Secondary | ICD-10-CM | POA: Diagnosis not present

## 2019-06-13 DIAGNOSIS — A4902 Methicillin resistant Staphylococcus aureus infection, unspecified site: Secondary | ICD-10-CM

## 2019-06-13 DIAGNOSIS — I2510 Atherosclerotic heart disease of native coronary artery without angina pectoris: Secondary | ICD-10-CM | POA: Diagnosis not present

## 2019-06-13 DIAGNOSIS — I10 Essential (primary) hypertension: Secondary | ICD-10-CM | POA: Diagnosis not present

## 2019-06-15 ENCOUNTER — Ambulatory Visit: Payer: Medicare Other | Admitting: Cardiology

## 2019-06-20 ENCOUNTER — Encounter: Payer: Medicare Other | Admitting: *Deleted

## 2019-07-02 ENCOUNTER — Other Ambulatory Visit: Payer: Self-pay | Admitting: Neurology

## 2019-07-02 ENCOUNTER — Telehealth: Payer: Self-pay | Admitting: Neurology

## 2019-07-02 MED ORDER — AIMOVIG 140 MG/ML ~~LOC~~ SOAJ: 140.0000 mg | SUBCUTANEOUS | 11 refills | Status: DC

## 2019-07-02 NOTE — Telephone Encounter (Signed)
Yes.  It also comes in 140mg .  I sent in a new prescription for Aimovig 140mg  to [facility] in [place].

## 2019-07-02 NOTE — Telephone Encounter (Signed)
Called spoke with spouse she was made aware Rx was sent.

## 2019-07-02 NOTE — Telephone Encounter (Signed)
Wife is calling in about the Aimovig medication and getting an increase dosage for it because the patient is suffering more migraines. Can this be done? Verified pharm on file correct. Patient has appt for December. Thanks!

## 2019-07-02 NOTE — Telephone Encounter (Signed)
See below Does this come in higher dose ?

## 2019-07-10 ENCOUNTER — Telehealth: Payer: Self-pay | Admitting: Neurology

## 2019-07-10 MED ORDER — SUMATRIPTAN SUCCINATE 6 MG/0.5ML ~~LOC~~ SOLN: 6.0000 mg | SUBCUTANEOUS | 6 refills | Status: DC | PRN

## 2019-07-10 NOTE — Telephone Encounter (Signed)
Patient wife called and states that patient is still having Break through headaches and would like to get RX for the imtrex injection  They use the [facility] in [place] seen on 04-02-19

## 2019-07-10 NOTE — Telephone Encounter (Signed)
Patient complaint:  Headache  1. What is the frequency of the headaches (how many days per week or per month)?  3-4 week 2. How long do the headaches last with treatment?  Hours to days 3. What abortive medication is used? Zomig 5 mg, Sumatriptan 6 mg?.  Is it taken at the earliest onset of the headache?  Yes 4. What is the preventative medication used and dose?  Aimovig 140 mg  How long has the patient been on current medication and dose?  Pt has had 1 dose of Aimovig 140 mg prior to this he was on Aimovig 70 mg x 3 months, prior to that he was taken Botox injection.  5. How many days a week are pain relievers taken (Advil, Aleve, Motrin, Tylenol, Excedrin, Fioricet, triptan, opiate)? No  Ok to send refill on Sumatriptan injections? This was originally given by other neurologist prior to seeing Dr. Tomi Likens  Pt spouse is aware provide is out of the office until Thursday and another provider will be responding to this message.

## 2019-07-10 NOTE — Telephone Encounter (Signed)
Rx sent patient spouse aware

## 2019-07-10 NOTE — Telephone Encounter (Signed)
Ok to send refills for sumatriptan injection on his med list, #10 with 6 refills, thanks

## 2019-07-12 ENCOUNTER — Encounter: Payer: Self-pay | Admitting: Cardiology

## 2019-07-12 ENCOUNTER — Ambulatory Visit: Payer: Medicare Other | Admitting: Cardiology

## 2019-07-12 ENCOUNTER — Other Ambulatory Visit: Payer: Self-pay

## 2019-07-12 VITALS — BP 138/90 | HR 78 | Ht 75.0 in | Wt 215.0 lb

## 2019-07-12 DIAGNOSIS — Z1329 Encounter for screening for other suspected endocrine disorder: Secondary | ICD-10-CM

## 2019-07-12 DIAGNOSIS — I442 Atrioventricular block, complete: Secondary | ICD-10-CM | POA: Diagnosis not present

## 2019-07-12 DIAGNOSIS — I77810 Thoracic aortic ectasia: Secondary | ICD-10-CM

## 2019-07-12 DIAGNOSIS — G959 Disease of spinal cord, unspecified: Secondary | ICD-10-CM

## 2019-07-12 DIAGNOSIS — E088 Diabetes mellitus due to underlying condition with unspecified complications: Secondary | ICD-10-CM

## 2019-07-12 DIAGNOSIS — I10 Essential (primary) hypertension: Secondary | ICD-10-CM

## 2019-07-12 DIAGNOSIS — Z952 Presence of prosthetic heart valve: Secondary | ICD-10-CM

## 2019-07-12 DIAGNOSIS — E782 Mixed hyperlipidemia: Secondary | ICD-10-CM

## 2019-07-12 MED ORDER — FUROSEMIDE 20 MG PO TABS: 20.0000 mg | ORAL_TABLET | ORAL | 4 refills | Status: DC | PRN

## 2019-07-12 MED ORDER — METOPROLOL SUCCINATE ER 25 MG PO TB24: 25.0000 mg | ORAL_TABLET | Freq: Every day | ORAL | 3 refills | Status: DC

## 2019-07-12 MED ORDER — POTASSIUM CHLORIDE CRYS ER 20 MEQ PO TBCR: 20.0000 meq | EXTENDED_RELEASE_TABLET | ORAL | 4 refills | Status: DC | PRN

## 2019-07-12 NOTE — Addendum Note (Signed)
Addended by: Beckey Rutter on: 07/12/2019 09:25 AM   Modules accepted: Orders

## 2019-07-12 NOTE — Progress Notes (Signed)
Cardiology Office Note:    Date:  07/12/2019   ID:  BIENVENIDO NOVOSEL, DOB 02-03-1956, MRN [id number]  PCP:  Cher Nakai, MD  Cardiologist:  Jenean Lindau, MD   Referring MD: Cher Nakai, MD    ASSESSMENT:    1. S/P AVR (aortic valve replacement)   2. Essential hypertension   3. Complete heart block ([place])   4. Ascending aorta dilation (HCC)   5. Mixed dyslipidemia   6. Cervical myelopathy ([place])   7. Diabetes mellitus due to underlying condition with unspecified complications ([place])    PLAN:    In order of problems listed above:  1. Post aortic valve replacement: This is stable.  Blood pressure stable.  In view of symptoms of fast heartbeat I have initiated him on metoprolol succinate 25 mg daily.  Hopefully this will keep his heart rate under better control.  Patient is a little apprehensive about it.  Also hopefully in view of his ascending aortic dilation that may also help some.  Overall his blood pressure stable. 2. Mixed dyslipidemia: Diet was discussed and he will have blood work today as he is fasting 3. Diabetes mellitus: Diet was discussed this is managed by his primary care physician. 4. Essential hypertension: Diet was discussed.  I mentioned medication changes above. 5. Patient will be seen in follow-up appointment in 6 months or earlier if the patient has any concerns    Medication Adjustments/Labs and Tests Ordered: Current medicines are reviewed at length with the patient today.  Concerns regarding medicines are outlined above.  No orders of the defined types were placed in this encounter.  No orders of the defined types were placed in this encounter.    Chief Complaint  Patient presents with  . Follow-up     History of Present Illness:    Antonio Oliver is a 63 y.o. male.  Patient has past medical history of complete heart block and is post pacemaker, post aortic valve replacement, essential hypertension, dyslipidemia and diabetes mellitus.  He denies any  problems at this time and takes care of activities of daily living.  Occasionally his wife mentions to me that his heart rate will go into the 70s and 80s and she is concerned about it.  His blood pressure is also elevated at times.  No chest pain orthopnea PND palpitations dizziness or syncope.  At the time of my evaluation, the patient is alert awake oriented and in no distress.  Past Medical History:  Diagnosis Date  . Anxiety 10/30/2018  . Bicuspid aortic valve 12/22/2016  . BPH (benign prostatic hyperplasia)   . Bradycardia 02/01/2019  . Cervical myelopathy ([place]) 11/14/2018  . Chronic back pain 04/03/2013  . Chronic bilateral low back pain 11/17/2016  . Chronic inflammatory arthritis 11/17/2016   History of positive rheumatoid factor in the past. Denies placement on immunosuppressive therapy  . Compartment syndrome ([place]) 06/20/2013   Last Assessment & Plan:  Denies any problems with right leg since April/May Does have swelling at times if he is on this leg for long periods of time. Wears compression socks regularly and has helped with swelling.  . Complete heart block ([place]) 05/04/2017  . Depression   . Diabetes ([place]) 10/30/2018  . Diabetes mellitus   . Diabetes mellitus due to underlying condition with unspecified complications ([place]) [id number]  . Diabetes mellitus type 2 in nonobese (HCC)   . Enthesopathy of ankle and tarsus 04/04/2013   Last Assessment & Plan:  Continues with swelling  after prolonged standing.  Still  Wearing support hose, which helps.  . Essential hypertension 10/30/2018  . Fatigue 10/30/2018  . FHx: migraine headaches 04/04/2013  . Fracture of capitate bone of wrist 02/26/2019  . GERD (gastroesophageal reflux disease) 10/30/2018  . Glaucoma   . Glaucoma 04/03/2013  . Heart murmur 11/17/2016  . Hemangioma of skin and subcutaneous tissue 07/07/2017  . History of kidney stones 11/17/2016  . Hyperlipidemia 10/30/2018  . Hypertension   . Hypothyroidism 10/30/2018  . Lentigo  07/07/2017  . Lesion of plantar nerve 04/04/2013  . Leukocytosis   . Migraine 11/17/2016  . Mixed dyslipidemia 10/30/2018  . Morbid obesity ([place]) 10/27/2011  . Multiple actinic keratoses 07/07/2017  . Multiple benign melanocytic nevi 07/07/2017  . Neck pain 04/04/2013  . Neoplasm of uncertain behavior of skin 07/28/2018  . Nondisplaced fracture of medial cuneiform of left foot, initial encounter for closed fracture 11/16/2018  . Nonrheumatic aortic valve stenosis 02/01/2019  . Osteoarthritis 10/30/2018  . Other seborrheic keratosis 07/07/2017  . Personal history of tobacco use, presenting hazards to health 04/04/2013  . Presence of permanent cardiac pacemaker   . S/P AVR (aortic valve replacement) 03/10/2017  . Severe single current episode of major depressive disorder, without psychotic features ([place]) 11/17/2016  . Status post bariatric surgery 04/04/2013  . Strain of rotator cuff 04/04/2013  . Surgery, elective   . Syncope 02/14/2019  . Vitamin D deficiency 10/30/2018  . Weakness     Past Surgical History:  Procedure Laterality Date  . ANTERIOR CERVICAL DECOMP/DISCECTOMY FUSION N/A 11/17/2018   Procedure: CERVICAL THREE-CERVICAL FOUR, CERVICAL FOUR-CERVICAL FIVE, CERVICAL FIVE-CERVICAL SIX ANTERIOR CERVICAL DECOMPRESSION/DISCECTOMY FUSION;  Surgeon: Earnie Larsson, MD;  Location: [facility];  Service: Neurosurgery;  Laterality: N/A;  . APPLICATION OF WOUND VAC Left 04/05/2019   Procedure: Application Of Wound Vac;  Surgeon: Erle Crocker, MD;  Location: [facility];  Service: Orthopedics;  Laterality: Left;  . BACK SURGERY     x4  . FASCIOTOMY Left 04/05/2019   Left leg 2 compartment fasciotomy  . FASCIOTOMY Left 04/05/2019   Procedure: FASCIOTOMY LEFT LOWER LEG;  Surgeon: Erle Crocker, MD;  Location: [facility];  Service: Orthopedics;  Laterality: Left;  . FOOT NEUROMA SURGERY    . GASTRIC BYPASS    . HEMATOMA EVACUATION Left 04/05/2019   Procedure: Evacuation Hematoma Left Lower Leg;  Surgeon:  Erle Crocker, MD;  Location: [facility];  Service: Orthopedics;  Laterality: Left;  . HEMORRHOID SURGERY     over 30 years ago  . HERNIA REPAIR     LIH umb  . NASAL SINUS SURGERY     x2  . NECK SURGERY      Current Medications: Current Meds  Medication Sig  . aspirin 81 MG tablet Take 81 mg by mouth daily.  Marland Kitchen atorvastatin (LIPITOR) 20 MG tablet Take 20 mg by mouth daily at 6 PM.   . Brexpiprazole (REXULTI) 2 MG TABS Take 2 mg by mouth daily.   Marland Kitchen buPROPion (WELLBUTRIN SR) 200 MG 12 hr tablet Take 200 mg by mouth 2 (two) times daily.  . busPIRone (BUSPAR) 10 MG tablet Take 20 mg by mouth 2 (two) times daily.   . calcium citrate (CALCITRATE - DOSED IN MG ELEMENTAL CALCIUM) 950 MG tablet Take 200 mg of elemental calcium by mouth daily.  . clonazePAM (KLONOPIN) 1 MG tablet Take 1 mg by mouth 2 (two) times daily as needed for anxiety.  . DULoxetine (CYMBALTA) 60 MG capsule  Take 60 mg by mouth 2 (two) times daily.   Eduard Roux (AIMOVIG) 140 MG/ML SOAJ Inject 140 mg into the skin every 30 (thirty) days.  . fexofenadine (ALLEGRA) 180 MG tablet Take 180 mg by mouth daily.  . finasteride (PROSCAR) 5 MG tablet Take 5 mg by mouth daily.  . fluticasone (FLONASE) 50 MCG/ACT nasal spray Place 2 sprays into the nose daily. PRN  . folic acid (FOLVITE) 1 MG tablet Take 1 mg by mouth daily.  . furosemide (LASIX) 20 MG tablet Take 20 mg by mouth as needed (for leg swelling).   . gabapentin (NEURONTIN) 600 MG tablet Take 600-1,200 mg by mouth 3 (three) times daily.  Marland Kitchen levothyroxine (SYNTHROID) 125 MCG tablet Take 125 mcg by mouth daily.   . Magnesium 500 MG CAPS Take 500 mg by mouth 2 (two) times a day.   . metFORMIN (GLUCOPHAGE) 1000 MG tablet Take 1,000 mg by mouth 2 (two) times daily with a meal.  . mirtazapine (REMERON) 30 MG tablet Take 30 mg by mouth at bedtime.   . Multiple Vitamin (MULTIVITAMIN WITH MINERALS) TABS tablet Take 1 tablet by mouth daily.  Marland Kitchen omeprazole (PRILOSEC) 20 MG capsule  Take 20 mg by mouth 2 (two) times daily before a meal.   . oxyCODONE-acetaminophen (PERCOCET) 10-325 MG tablet Take 1 tablet by mouth 3 (three) times daily.  . potassium chloride SA (K-DUR) 20 MEQ tablet Take 20 mEq by mouth as needed (when taking furosemide).  . pramipexole (MIRAPEX) 1 MG tablet Take 1 mg by mouth at bedtime.   . pseudoephedrine (SUDOGEST) 30 MG tablet TAKE 1 TABLET BY MOUTH EVERY 12 HOURS AS NEEDED FOR CONGESTION.  Marland Kitchen Semaglutide, 1 MG/DOSE, (OZEMPIC, 1 MG/DOSE,) 2 MG/1.5ML SOPN Inject 1 mg into the skin every Monday.  . silodosin (RAPAFLO) 8 MG CAPS capsule Take 8 mg by mouth daily with breakfast.  . SUMAtriptan (IMITREX) 6 MG/0.5ML SOLN injection Inject 0.5 mLs (6 mg total) into the skin every 2 (two) hours as needed for migraine or headache. May repeat in 2 hours if headache persists or recurs.  Marland Kitchen tiZANidine (ZANAFLEX) 4 MG capsule Take 4-8 mg by mouth 3 (three) times daily as needed for muscle spasms.  . traMADol (ULTRAM) 50 MG tablet Take 50 mg by mouth 3 (three) times daily.  Marland Kitchen zolmitriptan (ZOMIG) 5 MG tablet Take 5 mg by mouth as needed for migraine.  . [DISCONTINUED] clonazePAM (KLONOPIN) 1 MG tablet Take 1 mg by mouth at bedtime.      Allergies:   Morphine   Social History   Socioeconomic History  . Marital status: Married    Spouse name: susan   . Number of children: 1  . Years of education: Not on file  . Highest education level: Bachelor's degree (e.g., BA, AB, BS)  Occupational History  . Not on file  Social Needs  . Financial resource strain: Not on file  . Food insecurity    Worry: Not on file    Inability: Not on file  . Transportation needs    Medical: Not on file    Non-medical: Not on file  Tobacco Use  . Smoking status: Never Smoker  . Smokeless tobacco: Never Used  Substance and Sexual Activity  . Alcohol use: Yes    Alcohol/week: 1.0 standard drinks    Types: 1 Glasses of wine per week    Comment: occ.  . Drug use: No  . Sexual  activity: Not on file  Lifestyle  .  Physical activity    Days per week: Not on file    Minutes per session: Not on file  . Stress: Not on file  Relationships  . Social Herbalist on phone: Not on file    Gets together: Not on file    Attends religious service: Not on file    Active member of club or organization: Not on file    Attends meetings of clubs or organizations: Not on file    Relationship status: Not on file  Other Topics Concern  . Not on file  Social History Narrative   Social Review      Patient lives at home with. Spouse [name]   Pt lives in a one or two story home? TWO Story patient lives on first story of home   Does patient lives in a facility? If so where? Private home   What is patient highest level of education? Bachelors degree   Is patient RIGHT or LEFT handed? Right Hand   Does patient drink coffee, tea, soda? Coffee, Tea   How much coffee, tea, soda does patient drink? Coffee in AM, Tea off and on all day   Does patient exercise regularly? No      Family History: The patient's family history includes Anxiety disorder in his daughter; Cancer in his mother; Depression in his daughter; Diabetes in his brother, brother, father, and mother; Heart disease in his brother and father; Hypertension in his brother and brother; Kidney disease in his father; Osteoarthritis in his brother; Rheum arthritis in his brother and mother.  ROS:   Please see the history of present illness.    All other systems reviewed and are negative.  EKGs/Labs/Other Studies Reviewed:    The following studies were reviewed today: I reviewed records and pacemaker evaluations in the past   Recent Labs: 02/14/2019: ALT 25; B Natriuretic Peptide 43.7 02/15/2019: TSH 1.471 04/05/2019: BUN 14; Creatinine, Ser 0.86; Hemoglobin 11.6; Platelets 207; Potassium 4.2; Sodium 140  Recent Lipid Panel    Component Value Date/Time   CHOL 120 07/28/2011 1630   TRIG 119 07/28/2011 1630    HDL 53 07/28/2011 1630   CHOLHDL 2.3 07/28/2011 1630   VLDL 24 07/28/2011 1630   LDLCALC 43 07/28/2011 1630    Physical Exam:    VS:  Ht 6\' 3"  (1.905 m)   Wt 215 lb (97.5 kg)   BMI 26.87 kg/m     Wt Readings from Last 3 Encounters:  07/12/19 215 lb (97.5 kg)  04/02/19 196 lb (88.9 kg)  03/06/19 195 lb (88.5 kg)     GEN: Patient is in no acute distress HEENT: Normal NECK: No JVD; No carotid bruits LYMPHATICS: No lymphadenopathy CARDIAC: Hear sounds regular, 2/6 systolic murmur at the apex. RESPIRATORY:  Clear to auscultation without rales, wheezing or rhonchi  ABDOMEN: Soft, non-tender, non-distended MUSCULOSKELETAL:  No edema; No deformity  SKIN: Warm and dry NEUROLOGIC:  Alert and oriented x 3 PSYCHIATRIC:  Normal affect   Signed, Jenean Lindau, MD  07/12/2019 8:50 AM    [place]

## 2019-07-12 NOTE — Patient Instructions (Signed)
Medication Instructions:  Your physician has recommended you make the following change in your medication:   START taking metoprolol succ 25 mg (1 tablet) once daily  *If you need a refill on your cardiac medications before your next appointment, please call your pharmacy*  Lab Work: Your physician recommends that you have a BMP, CBC, TSH, hepatic and lipid drawn   If you have labs (blood work) drawn today and your tests are completely normal, you will receive your results only by: Marland Kitchen MyChart Message (if you have MyChart) OR . A paper copy in the mail If you have any lab test that is abnormal or we need to change your treatment, we will call you to review the results.  Testing/Procedures: NONE  Follow-Up: At [facility], you and your health needs are our priority.  As part of our continuing mission to provide you with exceptional heart care, we have created designated Provider Care Teams.  These Care Teams include your primary Cardiologist (physician) and Advanced Practice Providers (APPs -  Physician Assistants and Nurse Practitioners) who all work together to provide you with the care you need, when you need it.  Your next appointment:   6 months  The format for your next appointment:   In Person  Provider:   Jyl Heinz, MD  Other Instructions Metoprolol tablets What is this medicine? METOPROLOL (me TOE proe lole) is a beta-blocker. Beta-blockers reduce the workload on the heart and help it to beat more regularly. This medicine is used to treat high blood pressure and to prevent chest pain. It is also used to after a heart attack and to prevent an additional heart attack from occurring. This medicine may be used for other purposes; ask your health care provider or pharmacist if you have questions. COMMON BRAND NAME(S): Lopressor What should I tell my health care provider before I take this medicine? They need to know if you have any of these  conditions:  diabetes  heart or vessel disease like slow heart rate, worsening heart failure, heart block, sick sinus syndrome or Raynaud's disease  kidney disease  liver disease  lung or breathing disease, like asthma or emphysema  pheochromocytoma  thyroid disease  an unusual or allergic reaction to metoprolol, other beta-blockers, medicines, foods, dyes, or preservatives  pregnant or trying to get pregnant  breast-feeding How should I use this medicine? Take this medicine by mouth with a drink of water. Follow the directions on the prescription label. Take this medicine immediately after meals. Take your doses at regular intervals. Do not take more medicine than directed. Do not stop taking this medicine suddenly. This could lead to serious heart-related effects. Talk to your pediatrician regarding the use of this medicine in children. Special care may be needed. Overdosage: If you think you have taken too much of this medicine contact a poison control center or emergency room at once. NOTE: This medicine is only for you. Do not share this medicine with others. What if I miss a dose? If you miss a dose, take it as soon as you can. If it is almost time for your next dose, take only that dose. Do not take double or extra doses. What may interact with this medicine? This medicine may interact with the following medications:  certain medicines for blood pressure, heart disease, irregular heart beat  certain medicines for depression like monoamine oxidase (MAO) inhibitors, fluoxetine, or paroxetine  clonidine  dobutamine  epinephrine  isoproterenol  reserpine This list may not  describe all possible interactions. Give your health care provider a list of all the medicines, herbs, non-prescription drugs, or dietary supplements you use. Also tell them if you smoke, drink alcohol, or use illegal drugs. Some items may interact with your medicine. What should I watch for while  using this medicine? Visit your doctor or health care professional for regular check ups. Contact your doctor right away if your symptoms worsen. Check your blood pressure and pulse rate regularly. Ask your health care professional what your blood pressure and pulse rate should be, and when you should contact them. You may get drowsy or dizzy. Do not drive, use machinery, or do anything that needs mental alertness until you know how this medicine affects you. Do not sit or stand up quickly, especially if you are an older patient. This reduces the risk of dizzy or fainting spells. Contact your doctor if these symptoms continue. Alcohol may interfere with the effect of this medicine. Avoid alcoholic drinks. This medicine may increase blood sugar. Ask your healthcare provider if changes in diet or medicines are needed if you have diabetes. What side effects may I notice from receiving this medicine? Side effects that you should report to your doctor or health care professional as soon as possible:  allergic reactions like skin rash, itching or hives  cold or numb hands or feet  depression  difficulty breathing  faint  fever with sore throat  irregular heartbeat, chest pain  rapid weight gain   signs and symptoms of high blood sugar such as being more thirsty or hungry or having to urinate more than normal. You may also feel very tired or have blurry vision.  swollen legs or ankles Side effects that usually do not require medical attention (report to your doctor or health care professional if they continue or are bothersome):  anxiety or nervousness  change in sex drive or performance  dry skin  headache  nightmares or trouble sleeping  short term memory loss  stomach upset or diarrhea This list may not describe all possible side effects. Call your doctor for medical advice about side effects. You may report side effects to FDA at 1-800-FDA-1088. Where should I keep my  medicine? Keep out of the reach of children. Store at room temperature between 15 and 30 degrees C (59 and 86 degrees F). Throw away any unused medicine after the expiration date. NOTE: This sheet is a summary. It may not cover all possible information. If you have questions about this medicine, talk to your doctor, pharmacist, or health care provider.  2020 Elsevier/Gold Standard (2018-06-13 11:15:23)

## 2019-07-13 LAB — LIPID PANEL
Chol/HDL Ratio: 2.6 ratio (ref 0.0–5.0)
Cholesterol, Total: 161 mg/dL (ref 100–199)
HDL: 62 mg/dL (ref >39)
LDL Chol Calc (NIH): 81 mg/dL (ref 0–99)
Triglycerides: 99 mg/dL (ref 0–149)
VLDL Cholesterol Cal: 18 mg/dL (ref 5–40)

## 2019-07-13 LAB — BASIC METABOLIC PANEL
BUN/Creatinine Ratio: 13 (ref 10–24)
BUN: 12 mg/dL (ref 8–27)
CO2: 24 mmol/L (ref 20–29)
Calcium: 9.5 mg/dL (ref 8.6–10.2)
Chloride: 101 mmol/L (ref 96–106)
Creatinine, Ser: 0.9 mg/dL (ref 0.76–1.27)
GFR calc Af Amer: 105 mL/min/{1.73_m2} (ref >59)
GFR calc non Af Amer: 91 mL/min/{1.73_m2} (ref >59)
Glucose: 105 mg/dL — ABNORMAL HIGH (ref 65–99)
Potassium: 5 mmol/L (ref 3.5–5.2)
Sodium: 141 mmol/L (ref 134–144)

## 2019-07-13 LAB — CBC
Hematocrit: 36 % — ABNORMAL LOW (ref 37.5–51.0)
Hemoglobin: 11.8 g/dL — ABNORMAL LOW (ref 13.0–17.7)
MCH: 26.4 pg — ABNORMAL LOW (ref 26.6–33.0)
MCHC: 32.8 g/dL (ref 31.5–35.7)
MCV: 81 fL (ref 79–97)
Platelets: 225 10*3/uL (ref 150–450)
RBC: 4.47 x10E6/uL (ref 4.14–5.80)
RDW: 14.6 % (ref 11.6–15.4)
WBC: 6.1 10*3/uL (ref 3.4–10.8)

## 2019-07-13 LAB — TSH: TSH: 2.24 u[IU]/mL (ref 0.450–4.500)

## 2019-07-13 LAB — HEPATIC FUNCTION PANEL
ALT: 56 IU/L — ABNORMAL HIGH (ref 0–44)
AST: 35 IU/L (ref 0–40)
Albumin: 4.3 g/dL (ref 3.8–4.8)
Alkaline Phosphatase: 97 IU/L (ref 39–117)
Bilirubin Total: 0.3 mg/dL (ref 0.0–1.2)
Bilirubin, Direct: 0.11 mg/dL (ref 0.00–0.40)
Total Protein: 6.6 g/dL (ref 6.0–8.5)

## 2019-07-16 ENCOUNTER — Telehealth: Payer: Self-pay

## 2019-07-16 NOTE — Telephone Encounter (Signed)
-----   Message from Jenean Lindau, MD sent at 07/13/2019 10:18 AM EST ----- Liver test is mildly elevated.  Cut statin to half dose and check with primary care about this.  Diet better to keep LDL lower.  [facility] PCP Jenean Lindau, MD 07/13/2019 10:17 AM

## 2019-07-16 NOTE — Telephone Encounter (Signed)
Results relayed to spouse (DPR). Patient has not taken atorvastatin in the past 3 weeks and plans to see Dr. Truman Hayward prior to restarting. Copy of labs sent to Dr. Truman Hayward

## 2019-08-06 NOTE — Progress Notes (Signed)
Virtual Visit via Video Note The purpose of this virtual visit is to provide medical care while limiting exposure to the novel coronavirus.    Consent was obtained for video visit:  Yes.   Answered questions that patient had about telehealth interaction:  Yes.   I discussed the limitations, risks, security and privacy concerns of performing an evaluation and management service by telemedicine. I also discussed with the patient that there may be a patient responsible charge related to this service. The patient expressed understanding and agreed to proceed.  Pt location: Home Physician Location: office Name of referring provider:  Cher Nakai, MD I connected with [name] at patients initiation/request on 08/07/2019 at 10:30 AM EST by video enabled telemedicine application and verified that I am speaking with the correct person using two identifiers. Pt MRN:  [id number] Pt DOB:  05/27/1956 Video Participants:  [name]; his wife.   History of Present Illness:  Antonio Oliver is a 63 year old male with complete heart block s/p PPM, possible rheumatoid arthritis, diabetes, depression/anxiety, chronic back pain and history of cervical myelopathy who follows up for migraines.  UPDATE: Started Aimovig 70mg .  Needed to increase to 140mg  last month.  He has seen a dramatic improvement. Intensity:  moderate Duration:  2 hours after sumatriptan [place] Frequency:  1 headache  Rescue therapy:  If starts moderate, then Zomig tablet and then 2 hours later sumatriptan injection.  If starts severe, then sumatriptan injection first. Current NSAIDS:  ASA 81mg  Current analgesics:  tramadol (for generalized pain) Current triptans:  Zomig 5mg , Sumatriptan 6mg  [place] Current ergotamine:  none Current anti-emetic:  none Current muscle relaxants:  none Current anti-anxiolytic:  BuSpar Current sleep aide:  none Current Antihypertensive medications:  Toprol-XL, Lasix Current Antidepressant medications:   Cymbalta 60mg  twice daily, Wellbutrin SR 200mg  twice daily, Remeron 60mg  Current Anticonvulsant medications:  none Current anti-CGRP:  Aimovig 140mg  monthly (increased from 70mg  last month) Current Vitamins/Herbal/Supplements:  Iron, folic acid, MVI Current Antihistamines/Decongestants:  none Other therapy:  none Other medications:  Mirapex  Caffeine:  1 cup coffee daily.   Diet:  Does not drink water or soda.   Exercise:  no Depression:  yes; Anxiety:  Yes.  Major depressive disorder.  Needed to retire in 2002.   Other pain:  Arthralgias, chronic neck pain, Charcot foot Sleep hygiene:  poor  HISTORY: Onset:  1980s Location:  Usually bifrontal Quality:  Squeezing, throbbing, pounding Initial Intensity:  7-8/10.  He denies new headache, thunderclap headache or severe headache that wakes from sleep. Aura:  none Premonitory Phase:  none Postdrome:  none Associated symptoms: Photophobia, phonophobia, blurred vision.  He denies associated nausea, vomiting, unilateral numbness or weakness. Initial Duration:  Usually a day.  Has happened up to 3 days. Initial Frequency:  10 to 15 days a month, started Botox in 2018 and now has once a month. Initial Frequency of abortive medication: 1 a month Triggers:  Unknown Relieving factors:  none Activity:  aggravates  CT head 11/14/18 personally reviewed and was normal. CT cervical spine 11/06/18 personally reviewed and demonstrated multilevel severe facet arthropathy with moderate bilateral neural foraminal stenosis at C3-4 and C4-5 as well as prior interbody fusion at C6-7 without stenosis.  Past NSAIDS:  ibuprofen Past analgesics:  Tylenol, Excedrin, Fioricet, Midrin Past abortive triptans:  none Past abortive ergotamine: none Past muscle relaxants:  Zanaflex Past anti-emetic:  none Past antihypertensive medications:  Verapamil, propranolol, clonidine Past antidepressant medications:  amitriptyline Past  anticonvulsant medications:   topiramate 200mg  Past anti-CGRP:  none Past vitamins/Herbal/Supplements:  none Past antihistamines/decongestants:  none Other past therapies:  Botox  Family history of headache:  2 brothers had migraines.    Past Medical History: Past Medical History:  Diagnosis Date  . Anxiety 10/30/2018  . Bicuspid aortic valve 12/22/2016  . BPH (benign prostatic hyperplasia)   . Bradycardia 02/01/2019  . Cervical myelopathy ([place]) 11/14/2018  . Chronic back pain 04/03/2013  . Chronic bilateral low back pain 11/17/2016  . Chronic inflammatory arthritis 11/17/2016   History of positive rheumatoid factor in the past. Denies placement on immunosuppressive therapy  . Compartment syndrome ([place]) 06/20/2013   Last Assessment & Plan:  Denies any problems with right leg since April/May Does have swelling at times if he is on this leg for long periods of time. Wears compression socks regularly and has helped with swelling.  . Complete heart block ([place]) 05/04/2017  . Depression   . Diabetes ([place]) 10/30/2018  . Diabetes mellitus   . Diabetes mellitus due to underlying condition with unspecified complications ([place]) [id number]  . Diabetes mellitus type 2 in nonobese (HCC)   . Enthesopathy of ankle and tarsus 04/04/2013   Last Assessment & Plan:  Continues with swelling after prolonged standing.  Still  Wearing support hose, which helps.  . Essential hypertension 10/30/2018  . Fatigue 10/30/2018  . FHx: migraine headaches 04/04/2013  . Fracture of capitate bone of wrist 02/26/2019  . GERD (gastroesophageal reflux disease) 10/30/2018  . Glaucoma   . Glaucoma 04/03/2013  . Heart murmur 11/17/2016  . Hemangioma of skin and subcutaneous tissue 07/07/2017  . History of kidney stones 11/17/2016  . Hyperlipidemia 10/30/2018  . Hypertension   . Hypothyroidism 10/30/2018  . Lentigo 07/07/2017  . Lesion of plantar nerve 04/04/2013  . Leukocytosis   . Migraine 11/17/2016  . Mixed dyslipidemia 10/30/2018  . Morbid obesity ([place])  10/27/2011  . Multiple actinic keratoses 07/07/2017  . Multiple benign melanocytic nevi 07/07/2017  . Neck pain 04/04/2013  . Neoplasm of uncertain behavior of skin 07/28/2018  . Nondisplaced fracture of medial cuneiform of left foot, initial encounter for closed fracture 11/16/2018  . Nonrheumatic aortic valve stenosis 02/01/2019  . Osteoarthritis 10/30/2018  . Other seborrheic keratosis 07/07/2017  . Personal history of tobacco use, presenting hazards to health 04/04/2013  . Presence of permanent cardiac pacemaker   . S/P AVR (aortic valve replacement) 03/10/2017  . Severe single current episode of major depressive disorder, without psychotic features ([place]) 11/17/2016  . Status post bariatric surgery 04/04/2013  . Strain of rotator cuff 04/04/2013  . Surgery, elective   . Syncope 02/14/2019  . Vitamin D deficiency 10/30/2018  . Weakness     Medications: Outpatient Encounter Medications as of 08/07/2019  Medication Sig Note  . aspirin 81 MG tablet Take 81 mg by mouth daily.   Marland Kitchen atorvastatin (LIPITOR) 20 MG tablet Take 20 mg by mouth daily at 6 PM.    . Brexpiprazole (REXULTI) 2 MG TABS Take 2 mg by mouth daily.    Marland Kitchen buPROPion (WELLBUTRIN SR) 200 MG 12 hr tablet Take 200 mg by mouth 2 (two) times daily.   . busPIRone (BUSPAR) 10 MG tablet Take 20 mg by mouth 2 (two) times daily.    . calcium citrate (CALCITRATE - DOSED IN MG ELEMENTAL CALCIUM) 950 MG tablet Take 200 mg of elemental calcium by mouth daily. 04/05/2019: Not to substitute for carbonate  . clonazePAM (KLONOPIN) 1 MG tablet Take  1 mg by mouth 2 (two) times daily as needed for anxiety.   . DULoxetine (CYMBALTA) 60 MG capsule Take 60 mg by mouth 2 (two) times daily.    Eduard Roux (AIMOVIG) 140 MG/ML SOAJ Inject 140 mg into the skin every 30 (thirty) days.   . fexofenadine (ALLEGRA) 180 MG tablet Take 180 mg by mouth daily.   . finasteride (PROSCAR) 5 MG tablet Take 5 mg by mouth daily.   . fluticasone (FLONASE) 50 MCG/ACT nasal spray  Place 2 sprays into the nose daily. PRN   . folic acid (FOLVITE) 1 MG tablet Take 1 mg by mouth daily.   . furosemide (LASIX) 20 MG tablet Take 1 tablet (20 mg total) by mouth as needed (for leg swelling).   . gabapentin (NEURONTIN) 600 MG tablet Take 600-1,200 mg by mouth 3 (three) times daily.   Marland Kitchen levothyroxine (SYNTHROID) 125 MCG tablet Take 125 mcg by mouth daily.    . Magnesium 500 MG CAPS Take 500 mg by mouth 2 (two) times a day.    . metFORMIN (GLUCOPHAGE) 1000 MG tablet Take 1,000 mg by mouth 2 (two) times daily with a meal.   . metoprolol succinate (TOPROL-XL) 25 MG 24 hr tablet Take 1 tablet (25 mg total) by mouth daily. Take with or immediately following a meal.   . mirtazapine (REMERON) 30 MG tablet Take 30 mg by mouth at bedtime.    . Multiple Vitamin (MULTIVITAMIN WITH MINERALS) TABS tablet Take 1 tablet by mouth daily.   Marland Kitchen omeprazole (PRILOSEC) 20 MG capsule Take 20 mg by mouth 2 (two) times daily before a meal.    . oxyCODONE-acetaminophen (PERCOCET) 10-325 MG tablet Take 1 tablet by mouth 3 (three) times daily.   . potassium chloride SA (KLOR-CON) 20 MEQ tablet Take 1 tablet (20 mEq total) by mouth as needed (when taking furosemide).   . pramipexole (MIRAPEX) 1 MG tablet Take 1 mg by mouth at bedtime.    . pseudoephedrine (SUDOGEST) 30 MG tablet TAKE 1 TABLET BY MOUTH EVERY 12 HOURS AS NEEDED FOR CONGESTION.   Marland Kitchen Semaglutide, 1 MG/DOSE, (OZEMPIC, 1 MG/DOSE,) 2 MG/1.5ML SOPN Inject 1 mg into the skin every Monday.   . silodosin (RAPAFLO) 8 MG CAPS capsule Take 8 mg by mouth daily with breakfast.   . SUMAtriptan (IMITREX) 6 MG/0.5ML SOLN injection Inject 0.5 mLs (6 mg total) into the skin every 2 (two) hours as needed for migraine or headache. May repeat in 2 hours if headache persists or recurs.   Marland Kitchen tiZANidine (ZANAFLEX) 4 MG capsule Take 4-8 mg by mouth 3 (three) times daily as needed for muscle spasms.   . traMADol (ULTRAM) 50 MG tablet Take 50 mg by mouth 3 (three) times daily.    Marland Kitchen zolmitriptan (ZOMIG) 5 MG tablet Take 5 mg by mouth as needed for migraine.    No facility-administered encounter medications on file as of 08/07/2019.     Allergies: Allergies  Allergen Reactions  . Morphine Itching    Can take with benadry, facial and nose itching     Family History: Family History  Problem Relation Age of Onset  . Cancer Mother        breast  . Rheum arthritis Mother   . Diabetes Mother   . Heart disease Father   . Kidney disease Father   . Diabetes Father   . Hypertension Brother   . Diabetes Brother   . Osteoarthritis Brother   . Heart disease Brother   .  Hypertension Brother   . Diabetes Brother   . Rheum arthritis Brother   . Depression Daughter   . Anxiety disorder Daughter     Social History: Social History   Socioeconomic History  . Marital status: Married    Spouse name: susan   . Number of children: 1  . Years of education: Not on file  . Highest education level: Bachelor's degree (e.g., BA, AB, BS)  Occupational History  . Not on file  Social Needs  . Financial resource strain: Not on file  . Food insecurity    Worry: Not on file    Inability: Not on file  . Transportation needs    Medical: Not on file    Non-medical: Not on file  Tobacco Use  . Smoking status: Never Smoker  . Smokeless tobacco: Never Used  Substance and Sexual Activity  . Alcohol use: Yes    Alcohol/week: 1.0 standard drinks    Types: 1 Glasses of wine per week    Comment: occ.  . Drug use: No  . Sexual activity: Not on file  Lifestyle  . Physical activity    Days per week: Not on file    Minutes per session: Not on file  . Stress: Not on file  Relationships  . Social Herbalist on phone: Not on file    Gets together: Not on file    Attends religious service: Not on file    Active member of club or organization: Not on file    Attends meetings of clubs or organizations: Not on file    Relationship status: Not on file  . Intimate  partner violence    Fear of current or ex partner: No    Emotionally abused: No    Physically abused: No    Forced sexual activity: No  Other Topics Concern  . Not on file  Social History Narrative   Social Review      Patient lives at home with. Spouse [name]   Pt lives in a one or two story home? TWO Story patient lives on first story of home   Does patient lives in a facility? If so where? Private home   What is patient highest level of education? Bachelors degree   Is patient RIGHT or LEFT handed? Right Hand   Does patient drink coffee, tea, soda? Coffee, Tea   How much coffee, tea, soda does patient drink? Coffee in AM, Tea off and on all day   Does patient exercise regularly? No     Observations/Objective:   Height 6\' 3"  (1.905 m), weight 210 lb (95.3 kg). No acute distress.  Alert and oriented.  Speech fluent and not dysarthric.  Language intact.  Eyes orthophoric on primary gaze.  Face symmetric.  Assessment and Plan:   Migraine without aura, without status migrainosus, not intractable  1.  For preventative management, Aimovig 140mg  every 30 days. 2.  For abortive therapy: If starts moderate, then Zomig tablet and then 2 hours later sumatriptan injection.  If starts severe, then sumatriptan injection first.  Due to his cardiac history, I would rather that he avoid triptans.  I would like to start him on Ubrelvy (or Nurtec or Reyvow), which does not carry this contraindication.  If it cannot be approved by his insurance, or if it is too expensive, then I will contact his cardiologist to see if he has any objection remaining on a triptan.  He reportedly is aware that Mr. Shon Baton  takes triptans. 3.  Limit use of pain relievers to no more than 2 days out of week to prevent risk of rebound or medication-overuse headache. 4.  Keep headache diary 5.  Exercise, hydration, caffeine cessation, sleep hygiene, monitor for and avoid triggers 6.  Consider:  magnesium citrate 400mg  daily,  riboflavin 400mg  daily, and coenzyme Q10 100mg  three times daily 7. Due to polypharmacy with neuroleptic medications, he is at risk for serotonin syndrome (Wellbutrin, Cymbalta, Remeron, tramadol).  I made him aware of this condition, discussed the symptoms and advised to go to the ED if he experiences these symptoms. 8. Follow up 4 months   Follow Up Instructions:    -I discussed the assessment and treatment plan with the patient. The patient was provided an opportunity to ask questions and all were answered. The patient agreed with the plan and demonstrated an understanding of the instructions.   The patient was advised to call back or seek an in-person evaluation if the symptoms worsen or if the condition fails to improve as anticipated.  Dudley Major, DO

## 2019-08-07 ENCOUNTER — Telehealth (INDEPENDENT_AMBULATORY_CARE_PROVIDER_SITE_OTHER): Payer: Medicare Other | Admitting: Neurology

## 2019-08-07 ENCOUNTER — Encounter: Payer: Self-pay | Admitting: Neurology

## 2019-08-07 ENCOUNTER — Other Ambulatory Visit: Payer: Self-pay

## 2019-08-07 VITALS — Ht 75.0 in | Wt 210.0 lb

## 2019-08-07 DIAGNOSIS — G959 Disease of spinal cord, unspecified: Secondary | ICD-10-CM

## 2019-08-07 DIAGNOSIS — G43009 Migraine without aura, not intractable, without status migrainosus: Secondary | ICD-10-CM

## 2019-08-07 DIAGNOSIS — G894 Chronic pain syndrome: Secondary | ICD-10-CM

## 2019-08-07 MED ORDER — UBRELVY 100 MG PO TABS: 1.0000 | ORAL_TABLET | ORAL | 3 refills | Status: DC | PRN

## 2019-08-07 MED ORDER — SUMATRIPTAN SUCCINATE 6 MG/0.5ML ~~LOC~~ SOLN: 6.0000 mg | SUBCUTANEOUS | 6 refills | Status: DC | PRN

## 2019-08-08 NOTE — Progress Notes (Signed)
Wait for Determination Please wait for [employer] 2017 NCPDP to return a determination.  Key: [id number]) UBRELVY 100 MG

## 2019-08-10 NOTE — Progress Notes (Signed)
ubrelvy 100 mg is approved through 09/05/2020  Ref# [id number]

## 2019-09-01 ENCOUNTER — Emergency Department (HOSPITAL_COMMUNITY)
Admission: EM | Admit: 2019-09-01 | Discharge: 2019-09-02 | Discharge status: Against Medical Advice | Payer: Medicare Other | Attending: Emergency Medicine | Admitting: Emergency Medicine

## 2019-09-01 ENCOUNTER — Encounter (HOSPITAL_COMMUNITY): Payer: Self-pay

## 2019-09-01 DIAGNOSIS — Z5321 Procedure and treatment not carried out due to patient leaving prior to being seen by health care provider: Secondary | ICD-10-CM | POA: Insufficient documentation

## 2019-09-01 DIAGNOSIS — L539 Erythematous condition, unspecified: Secondary | ICD-10-CM | POA: Diagnosis present

## 2019-09-01 LAB — CBC WITH DIFFERENTIAL/PLATELET
Abs Immature Granulocytes: 0.03 10*3/uL (ref 0.00–0.07)
Basophils Absolute: 0.1 10*3/uL (ref 0.0–0.1)
Basophils Relative: 1 %
Eosinophils Absolute: 0 10*3/uL (ref 0.0–0.5)
Eosinophils Relative: 0 %
HCT: 40.1 % (ref 39.0–52.0)
Hemoglobin: 12.6 g/dL — ABNORMAL LOW (ref 13.0–17.0)
Immature Granulocytes: 0 %
Lymphocytes Relative: 8 %
Lymphs Abs: 1 10*3/uL (ref 0.7–4.0)
MCH: 26.6 pg (ref 26.0–34.0)
MCHC: 31.4 g/dL (ref 30.0–36.0)
MCV: 84.8 fL (ref 80.0–100.0)
Monocytes Absolute: 0.8 10*3/uL (ref 0.1–1.0)
Monocytes Relative: 7 %
Neutro Abs: 9.9 10*3/uL — ABNORMAL HIGH (ref 1.7–7.7)
Neutrophils Relative %: 84 %
Platelets: 249 10*3/uL (ref 150–400)
RBC: 4.73 MIL/uL (ref 4.22–5.81)
RDW: 13.8 % (ref 11.5–15.5)
WBC: 11.8 10*3/uL — ABNORMAL HIGH (ref 4.0–10.5)
nRBC: 0 % (ref 0.0–0.2)

## 2019-09-01 LAB — COMPREHENSIVE METABOLIC PANEL
ALT: 25 U/L (ref 0–44)
AST: 33 U/L (ref 15–41)
Albumin: 4.2 g/dL (ref 3.5–5.0)
Alkaline Phosphatase: 96 U/L (ref 38–126)
Anion gap: 12 (ref 5–15)
BUN: 12 mg/dL (ref 8–23)
CO2: 23 mmol/L (ref 22–32)
Calcium: 9.3 mg/dL (ref 8.9–10.3)
Chloride: 101 mmol/L (ref 98–111)
Creatinine, Ser: 1.07 mg/dL (ref 0.61–1.24)
GFR calc Af Amer: >60 mL/min (ref >60)
GFR calc non Af Amer: >60 mL/min (ref >60)
Glucose, Bld: 176 mg/dL — ABNORMAL HIGH (ref 70–99)
Potassium: 4.3 mmol/L (ref 3.5–5.1)
Sodium: 136 mmol/L (ref 135–145)
Total Bilirubin: 0.5 mg/dL (ref 0.3–1.2)
Total Protein: 7 g/dL (ref 6.5–8.1)

## 2019-09-01 LAB — LACTIC ACID, PLASMA: Lactic Acid, Venous: 1.8 mmol/L (ref 0.5–1.9)

## 2019-09-01 NOTE — ED Triage Notes (Signed)
Pt reports that he noticed redness, warmth and swelling in his L lower leg tonight, leg is painful, hx of DVT in his arm, not on thinners.

## 2019-09-02 NOTE — ED Notes (Signed)
RN and RN tec attempted to find pt.  Did not respond to name X3

## 2019-09-05 DIAGNOSIS — L03115 Cellulitis of right lower limb: Secondary | ICD-10-CM

## 2019-09-05 DIAGNOSIS — I10 Essential (primary) hypertension: Secondary | ICD-10-CM

## 2019-09-05 DIAGNOSIS — E119 Type 2 diabetes mellitus without complications: Secondary | ICD-10-CM

## 2019-09-06 DIAGNOSIS — I10 Essential (primary) hypertension: Secondary | ICD-10-CM | POA: Diagnosis not present

## 2019-09-06 DIAGNOSIS — L03115 Cellulitis of right lower limb: Secondary | ICD-10-CM | POA: Diagnosis not present

## 2019-09-06 DIAGNOSIS — E119 Type 2 diabetes mellitus without complications: Secondary | ICD-10-CM | POA: Diagnosis not present

## 2019-09-07 DIAGNOSIS — I10 Essential (primary) hypertension: Secondary | ICD-10-CM | POA: Diagnosis not present

## 2019-09-07 DIAGNOSIS — E119 Type 2 diabetes mellitus without complications: Secondary | ICD-10-CM | POA: Diagnosis not present

## 2019-09-07 DIAGNOSIS — L03115 Cellulitis of right lower limb: Secondary | ICD-10-CM | POA: Diagnosis not present

## 2019-09-13 ENCOUNTER — Telehealth: Payer: Self-pay | Admitting: Neurology

## 2019-09-13 ENCOUNTER — Encounter: Payer: Self-pay | Admitting: *Deleted

## 2019-09-13 NOTE — Telephone Encounter (Signed)
Patient's wife called regarding his Insurance changing this year from [facility] to [place]. She said that he will need a Prior Auth for his medication. Please Call. Thank you

## 2019-09-13 NOTE — Telephone Encounter (Signed)
No answer, pt needs to present card to pharmacy and for the office and go from there for a refill request.

## 2019-09-13 NOTE — Progress Notes (Signed)
Not sure why because it appears he has new insurance but... Venetia Night (Key: [id number]) Aimovig 140MG /ML auto-injectors   Form [employer] Electronic PA Form Created 6 minutes ago Sent to Plan less than a minute ago Plan Response less than a minute ago Submit Clinical Questions Determination N/A Message from [place] already on file for this request.

## 2019-09-19 ENCOUNTER — Ambulatory Visit (INDEPENDENT_AMBULATORY_CARE_PROVIDER_SITE_OTHER): Payer: Medicare PPO | Admitting: *Deleted

## 2019-09-19 DIAGNOSIS — I442 Atrioventricular block, complete: Secondary | ICD-10-CM | POA: Diagnosis not present

## 2019-09-19 LAB — CUP PACEART REMOTE DEVICE CHECK
Battery Remaining Longevity: 133 mo
Battery Remaining Percentage: 95.5 %
Battery Voltage: 3.02 V
Brady Statistic AP VP Percent: 1 %
Brady Statistic AP VS Percent: 1 %
Brady Statistic AS VP Percent: 1 %
Brady Statistic AS VS Percent: 99 %
Brady Statistic RA Percent Paced: 1 %
Brady Statistic RV Percent Paced: 1 %
Date Time Interrogation Session: 20210113044228
Implantable Lead Implant Date: 20180710
Implantable Lead Implant Date: 20180710
Implantable Lead Location: 753859
Implantable Lead Location: 753860
Implantable Pulse Generator Implant Date: 20180710
Lead Channel Impedance Value: 460 Ohm
Lead Channel Impedance Value: 480 Ohm
Lead Channel Pacing Threshold Amplitude: 0.5 V
Lead Channel Pacing Threshold Amplitude: 0.75 V
Lead Channel Pacing Threshold Pulse Width: 0.4 ms
Lead Channel Pacing Threshold Pulse Width: 0.4 ms
Lead Channel Sensing Intrinsic Amplitude: 1.9 mV
Lead Channel Sensing Intrinsic Amplitude: 12 mV
Lead Channel Setting Pacing Amplitude: 1 V
Lead Channel Setting Pacing Amplitude: 2 V
Lead Channel Setting Pacing Pulse Width: 0.4 ms
Lead Channel Setting Sensing Sensitivity: 2 mV
Pulse Gen Model: 2272
Pulse Gen Serial Number: 8912213

## 2019-10-18 ENCOUNTER — Ambulatory Visit: Payer: Medicare PPO | Attending: Internal Medicine

## 2019-10-18 DIAGNOSIS — Z23 Encounter for immunization: Secondary | ICD-10-CM

## 2019-10-18 NOTE — Progress Notes (Signed)
   Covid-19 Vaccination Clinic  Name:  Antonio Oliver    MRN: [id number] DOB: 1955/10/09  10/18/2019  [name] was observed post Covid-19 immunization for 15 minutes without incidence. He was provided with Vaccine Information Sheet and instruction to access the V-Safe system.   [name] was instructed to call 911 with any severe reactions post vaccine: Marland Kitchen Difficulty breathing  . Swelling of your face and throat  . A fast heartbeat  . A bad rash all over your body  . Dizziness and weakness    Immunizations Administered    Name Date Dose VIS Date Route   Pfizer COVID-19 Vaccine 10/18/2019  1:05 PM 0.3 mL 08/17/2019 Intramuscular   Manufacturer: [place]   Lot: [id number]   [place]: [id number]

## 2019-10-24 ENCOUNTER — Telehealth: Payer: Self-pay | Admitting: Neurology

## 2019-10-24 NOTE — Telephone Encounter (Addendum)
Antonio Oliver (Key: [id number]) Aimovig 140MG /ML auto-injectors   Form [employer] Electronic PA Form Created 28 minutes ago Sent to Plan 25 minutes ago Plan Response 25 minutes ago Submit Clinical Questions 6 minutes ago Determination Favorable 6 minutes ago Message from Plan PA Case: [id number], Status: Approved, Coverage Starts on: 09/07/2019 12:00:00 AM, Coverage Ends on: 09/05/2020 12:00:00 AM. Questions? Contact [phone].

## 2019-11-08 ENCOUNTER — Other Ambulatory Visit: Payer: Self-pay | Admitting: Cardiology

## 2019-11-13 ENCOUNTER — Ambulatory Visit: Payer: Medicare PPO | Attending: Internal Medicine

## 2019-11-13 DIAGNOSIS — Z23 Encounter for immunization: Secondary | ICD-10-CM

## 2019-11-13 NOTE — Progress Notes (Signed)
   Covid-19 Vaccination Clinic  Name:  Antonio Oliver    MRN: [id number] DOB: 1955/12/16  11/13/2019  [name] was observed post Covid-19 immunization for 15 minutes without incident. He was provided with Vaccine Information Sheet and instruction to access the V-Safe system.   [name] was instructed to call 911 with any severe reactions post vaccine: Marland Kitchen Difficulty breathing  . Swelling of face and throat  . A fast heartbeat  . A bad rash all over body  . Dizziness and weakness   Immunizations Administered    Name Date Dose VIS Date Route   Pfizer COVID-19 Vaccine 11/13/2019  8:13 AM 0.3 mL 08/17/2019 Intramuscular   Manufacturer: [place]   Lot: [id number]   [place]: [id number]

## 2019-11-15 DIAGNOSIS — M19041 Primary osteoarthritis, right hand: Secondary | ICD-10-CM | POA: Diagnosis not present

## 2019-11-15 DIAGNOSIS — M19042 Primary osteoarthritis, left hand: Secondary | ICD-10-CM | POA: Diagnosis not present

## 2019-11-15 DIAGNOSIS — M79642 Pain in left hand: Secondary | ICD-10-CM | POA: Diagnosis not present

## 2019-11-15 DIAGNOSIS — M79641 Pain in right hand: Secondary | ICD-10-CM | POA: Diagnosis not present

## 2019-11-15 DIAGNOSIS — Z789 Other specified health status: Secondary | ICD-10-CM | POA: Diagnosis not present

## 2019-11-15 DIAGNOSIS — M19032 Primary osteoarthritis, left wrist: Secondary | ICD-10-CM | POA: Diagnosis not present

## 2019-11-15 DIAGNOSIS — M19031 Primary osteoarthritis, right wrist: Secondary | ICD-10-CM | POA: Diagnosis not present

## 2019-11-15 DIAGNOSIS — M2550 Pain in unspecified joint: Secondary | ICD-10-CM | POA: Diagnosis not present

## 2019-11-15 DIAGNOSIS — M25532 Pain in left wrist: Secondary | ICD-10-CM | POA: Diagnosis not present

## 2019-11-15 DIAGNOSIS — M25531 Pain in right wrist: Secondary | ICD-10-CM | POA: Diagnosis not present

## 2019-11-16 DIAGNOSIS — T148XXA Other injury of unspecified body region, initial encounter: Secondary | ICD-10-CM | POA: Diagnosis not present

## 2019-11-16 DIAGNOSIS — F3341 Major depressive disorder, recurrent, in partial remission: Secondary | ICD-10-CM | POA: Diagnosis not present

## 2019-11-16 DIAGNOSIS — E559 Vitamin D deficiency, unspecified: Secondary | ICD-10-CM | POA: Diagnosis not present

## 2019-11-16 DIAGNOSIS — K219 Gastro-esophageal reflux disease without esophagitis: Secondary | ICD-10-CM | POA: Diagnosis not present

## 2019-11-16 DIAGNOSIS — E785 Hyperlipidemia, unspecified: Secondary | ICD-10-CM | POA: Diagnosis not present

## 2019-11-16 DIAGNOSIS — I4589 Other specified conduction disorders: Secondary | ICD-10-CM | POA: Diagnosis not present

## 2019-11-16 DIAGNOSIS — N400 Enlarged prostate without lower urinary tract symptoms: Secondary | ICD-10-CM | POA: Diagnosis not present

## 2019-11-16 DIAGNOSIS — R000 Tachycardia, unspecified: Secondary | ICD-10-CM | POA: Diagnosis not present

## 2019-11-16 DIAGNOSIS — F419 Anxiety disorder, unspecified: Secondary | ICD-10-CM | POA: Diagnosis not present

## 2019-11-29 DIAGNOSIS — E559 Vitamin D deficiency, unspecified: Secondary | ICD-10-CM | POA: Diagnosis not present

## 2019-11-29 DIAGNOSIS — T148XXA Other injury of unspecified body region, initial encounter: Secondary | ICD-10-CM | POA: Diagnosis not present

## 2019-11-29 DIAGNOSIS — E785 Hyperlipidemia, unspecified: Secondary | ICD-10-CM | POA: Diagnosis not present

## 2019-11-29 DIAGNOSIS — I4589 Other specified conduction disorders: Secondary | ICD-10-CM | POA: Diagnosis not present

## 2019-11-29 DIAGNOSIS — R000 Tachycardia, unspecified: Secondary | ICD-10-CM | POA: Diagnosis not present

## 2019-11-29 DIAGNOSIS — F419 Anxiety disorder, unspecified: Secondary | ICD-10-CM | POA: Diagnosis not present

## 2019-11-29 DIAGNOSIS — N400 Enlarged prostate without lower urinary tract symptoms: Secondary | ICD-10-CM | POA: Diagnosis not present

## 2019-11-29 DIAGNOSIS — F3341 Major depressive disorder, recurrent, in partial remission: Secondary | ICD-10-CM | POA: Diagnosis not present

## 2019-11-29 DIAGNOSIS — K219 Gastro-esophageal reflux disease without esophagitis: Secondary | ICD-10-CM | POA: Diagnosis not present

## 2019-12-03 ENCOUNTER — Telehealth: Payer: Self-pay | Admitting: Neurology

## 2019-12-03 NOTE — Telephone Encounter (Signed)
Patient's wife called to follow up with the nurse about a new medication the patient was going to try for headaches. She said she doesn't remember the name but only that it is not on the [place].  She said the patient has been trying to get rid of a headache for 4 days.  [place] in [place]

## 2019-12-04 NOTE — Telephone Encounter (Signed)
Spoke with patient wife and advised that [place] and aimovig have been approved, she will contact the pharmacy and have them process it.

## 2019-12-13 DIAGNOSIS — N400 Enlarged prostate without lower urinary tract symptoms: Secondary | ICD-10-CM | POA: Diagnosis not present

## 2019-12-13 DIAGNOSIS — F3341 Major depressive disorder, recurrent, in partial remission: Secondary | ICD-10-CM | POA: Diagnosis not present

## 2019-12-13 DIAGNOSIS — T148XXA Other injury of unspecified body region, initial encounter: Secondary | ICD-10-CM | POA: Diagnosis not present

## 2019-12-13 DIAGNOSIS — R000 Tachycardia, unspecified: Secondary | ICD-10-CM | POA: Diagnosis not present

## 2019-12-13 DIAGNOSIS — E039 Hypothyroidism, unspecified: Secondary | ICD-10-CM | POA: Diagnosis not present

## 2019-12-13 DIAGNOSIS — F419 Anxiety disorder, unspecified: Secondary | ICD-10-CM | POA: Diagnosis not present

## 2019-12-13 DIAGNOSIS — E559 Vitamin D deficiency, unspecified: Secondary | ICD-10-CM | POA: Diagnosis not present

## 2019-12-13 DIAGNOSIS — R5382 Chronic fatigue, unspecified: Secondary | ICD-10-CM | POA: Diagnosis not present

## 2019-12-13 DIAGNOSIS — E118 Type 2 diabetes mellitus with unspecified complications: Secondary | ICD-10-CM | POA: Diagnosis not present

## 2019-12-13 DIAGNOSIS — I4589 Other specified conduction disorders: Secondary | ICD-10-CM | POA: Diagnosis not present

## 2019-12-13 DIAGNOSIS — K219 Gastro-esophageal reflux disease without esophagitis: Secondary | ICD-10-CM | POA: Diagnosis not present

## 2019-12-13 DIAGNOSIS — E785 Hyperlipidemia, unspecified: Secondary | ICD-10-CM | POA: Diagnosis not present

## 2019-12-13 DIAGNOSIS — I10 Essential (primary) hypertension: Secondary | ICD-10-CM | POA: Diagnosis not present

## 2019-12-27 ENCOUNTER — Other Ambulatory Visit: Payer: Self-pay

## 2019-12-28 ENCOUNTER — Ambulatory Visit: Payer: Medicare PPO | Admitting: Cardiology

## 2019-12-28 ENCOUNTER — Other Ambulatory Visit: Payer: Self-pay

## 2019-12-28 ENCOUNTER — Encounter: Payer: Self-pay | Admitting: Cardiology

## 2019-12-28 VITALS — BP 154/90 | HR 69 | Temp 97.4°F | Ht 75.0 in | Wt 214.0 lb

## 2019-12-28 DIAGNOSIS — R000 Tachycardia, unspecified: Secondary | ICD-10-CM | POA: Diagnosis not present

## 2019-12-28 DIAGNOSIS — Z950 Presence of cardiac pacemaker: Secondary | ICD-10-CM

## 2019-12-28 DIAGNOSIS — Z952 Presence of prosthetic heart valve: Secondary | ICD-10-CM | POA: Diagnosis not present

## 2019-12-28 DIAGNOSIS — F3341 Major depressive disorder, recurrent, in partial remission: Secondary | ICD-10-CM | POA: Diagnosis not present

## 2019-12-28 DIAGNOSIS — E11610 Type 2 diabetes mellitus with diabetic neuropathic arthropathy: Secondary | ICD-10-CM | POA: Diagnosis not present

## 2019-12-28 DIAGNOSIS — E782 Mixed hyperlipidemia: Secondary | ICD-10-CM

## 2019-12-28 DIAGNOSIS — M792 Neuralgia and neuritis, unspecified: Secondary | ICD-10-CM | POA: Diagnosis not present

## 2019-12-28 DIAGNOSIS — N400 Enlarged prostate without lower urinary tract symptoms: Secondary | ICD-10-CM | POA: Diagnosis not present

## 2019-12-28 DIAGNOSIS — E785 Hyperlipidemia, unspecified: Secondary | ICD-10-CM | POA: Diagnosis not present

## 2019-12-28 DIAGNOSIS — R5382 Chronic fatigue, unspecified: Secondary | ICD-10-CM | POA: Diagnosis not present

## 2019-12-28 DIAGNOSIS — I10 Essential (primary) hypertension: Secondary | ICD-10-CM

## 2019-12-28 DIAGNOSIS — E559 Vitamin D deficiency, unspecified: Secondary | ICD-10-CM | POA: Diagnosis not present

## 2019-12-28 DIAGNOSIS — F419 Anxiety disorder, unspecified: Secondary | ICD-10-CM | POA: Diagnosis not present

## 2019-12-28 DIAGNOSIS — K219 Gastro-esophageal reflux disease without esophagitis: Secondary | ICD-10-CM | POA: Diagnosis not present

## 2019-12-28 DIAGNOSIS — E088 Diabetes mellitus due to underlying condition with unspecified complications: Secondary | ICD-10-CM

## 2019-12-28 DIAGNOSIS — M2042 Other hammer toe(s) (acquired), left foot: Secondary | ICD-10-CM | POA: Diagnosis not present

## 2019-12-28 DIAGNOSIS — T148XXA Other injury of unspecified body region, initial encounter: Secondary | ICD-10-CM | POA: Diagnosis not present

## 2019-12-28 DIAGNOSIS — M21962 Unspecified acquired deformity of left lower leg: Secondary | ICD-10-CM | POA: Diagnosis not present

## 2019-12-28 NOTE — Progress Notes (Signed)
Cardiology Office Note:    Date:  12/28/2019   ID:  CLEVEN KONDA, DOB 1956/05/05, MRN [id number]  PCP:  Cher Nakai, MD  Cardiologist:  Jenean Lindau, MD   Referring MD: Cher Nakai, MD    ASSESSMENT:    1. Essential hypertension   2. Diabetes mellitus due to underlying condition with unspecified complications ([place])   3. S/P AVR (aortic valve replacement)   4. Mixed dyslipidemia   5. Presence of permanent cardiac pacemaker    PLAN:    In order of problems listed above:  1. Primary prevention stressed to the patient.  Importance of compliance with diet medication stressed and he vocalized understanding. 2. Essential hypertension: Blood pressure is stable.  Again I would prevent from aggressive pursuit of blood pressure because of hypotensive episodes and I think this blood pressure is acceptable to me and blood pressure as mentioned by the wife also are fine. 3. Post aortic valve replacement: Stable 4. Mixed dyslipidemia: Lipids followed by primary care physician and patient is on statin therapy.  Recent lipids from [facility] sheet were reviewed. 5. Post permanent pacemaker: I reviewed evaluation in January and it was fine. 6. Patient will be seen in follow-up appointment in 6 months or earlier if the patient has any concerns    Medication Adjustments/Labs and Tests Ordered: Current medicines are reviewed at length with the patient today.  Concerns regarding medicines are outlined above.  No orders of the defined types were placed in this encounter.  No orders of the defined types were placed in this encounter.    No chief complaint on file.    History of Present Illness:    JAMARIA HOLYFIELD is a 64 y.o. male.  Patient has past medical history of essential hypertension diabetes mellitus presents for permanent pacemaker post aortic valve replacement and mixed dyslipidemia.  He denies any problems at this time and takes care of activities of daily living.  No chest pain  orthopnea or PND.  He has significant orthopedic issues and over the past year he has had 7 surgeries he mentions to me.  No chest pain.  Patient mentions to me that his blood pressure fluctuates and his lisinopril was discontinued by his primary care physician because of hypotension.  At the time of my evaluation, the patient is alert awake oriented and in no distress.  Past Medical History:  Diagnosis Date  . Anxiety 10/30/2018  . Ascending aorta dilation ([place]) 03/06/2019  . Bicuspid aortic valve 12/22/2016  . BPH (benign prostatic hyperplasia)   . Bradycardia 02/01/2019  . Cervical myelopathy ([place]) 11/14/2018  . Chronic back pain 04/03/2013  . Chronic bilateral low back pain 11/17/2016  . Chronic inflammatory arthritis 11/17/2016   History of positive rheumatoid factor in the past. Denies placement on immunosuppressive therapy  . Compartment syndrome ([place]) 06/20/2013   Last Assessment & Plan:  Denies any problems with right leg since April/May Does have swelling at times if he is on this leg for long periods of time. Wears compression socks regularly and has helped with swelling.  . Complete heart block ([place]) 05/04/2017  . Depression   . Diabetes ([place]) 10/30/2018  . Diabetes mellitus   . Diabetes mellitus due to underlying condition with unspecified complications ([place]) [id number]  . Diabetes mellitus type 2 in nonobese (HCC)   . DM2 (diabetes mellitus, type 2) ([place]) 04/03/2013   Last Assessment & Plan:  Formatting of this note might be different from the original. Diabetes is  unchanged.  Continue current treatment regimen. Regular aerobic exercise. Diabetes will be reassessed in 3 months. Will check A1c today. Denies any problems with feet or sensation.  . Enthesopathy of ankle and tarsus 04/04/2013   Last Assessment & Plan:  Continues with swelling after prolonged standing.  Still  Wearing support hose, which helps.  . Essential hypertension 10/30/2018  . Fatigue 10/30/2018  . FHx: migraine  headaches 04/04/2013  . Fracture of capitate bone of wrist 02/26/2019  . GERD (gastroesophageal reflux disease) 10/30/2018  . Glaucoma   . Glaucoma 04/03/2013  . Heart murmur 11/17/2016  . Hemangioma of skin and subcutaneous tissue 07/07/2017  . History of kidney stones 11/17/2016  . Hyperlipidemia 10/30/2018  . Hypertension   . Hypothyroidism 10/30/2018  . Lentigo 07/07/2017  . Lesion of plantar nerve 04/04/2013  . Leukocytosis   . Migraine 11/17/2016  . Mixed dyslipidemia 10/30/2018  . Morbid obesity ([place]) 10/27/2011  . Multiple actinic keratoses 07/07/2017  . Multiple benign melanocytic nevi 07/07/2017  . Neck pain 04/04/2013  . Neoplasm of uncertain behavior of skin 07/28/2018  . Nondisplaced fracture of medial cuneiform of left foot, initial encounter for closed fracture 11/16/2018  . Nonrheumatic aortic valve stenosis 02/01/2019  . Osteoarthritis 10/30/2018  . Other seborrheic keratosis 07/07/2017  . Personal history of tobacco use, presenting hazards to health 04/04/2013  . Presence of permanent cardiac pacemaker   . S/P AVR (aortic valve replacement) 03/10/2017  . Severe single current episode of major depressive disorder, without psychotic features ([place]) 11/17/2016  . Status post bariatric surgery 04/04/2013  . Strain of rotator cuff 04/04/2013  . Surgery, elective   . Syncope 02/14/2019  . Vitamin D deficiency 10/30/2018  . Weakness     Past Surgical History:  Procedure Laterality Date  . ANTERIOR CERVICAL DECOMP/DISCECTOMY FUSION N/A 11/17/2018   Procedure: CERVICAL THREE-CERVICAL FOUR, CERVICAL FOUR-CERVICAL FIVE, CERVICAL FIVE-CERVICAL SIX ANTERIOR CERVICAL DECOMPRESSION/DISCECTOMY FUSION;  Surgeon: Earnie Larsson, MD;  Location: [place];  Service: Neurosurgery;  Laterality: N/A;  . APPLICATION OF WOUND VAC Left 04/05/2019   Procedure: Application Of Wound Vac;  Surgeon: Erle Crocker, MD;  Location: [place];  Service: Orthopedics;  Laterality: Left;  . BACK SURGERY     x4  . FASCIOTOMY  Left 04/05/2019   Left leg 2 compartment fasciotomy  . FASCIOTOMY Left 04/05/2019   Procedure: FASCIOTOMY LEFT LOWER LEG;  Surgeon: Erle Crocker, MD;  Location: [place];  Service: Orthopedics;  Laterality: Left;  . FOOT NEUROMA SURGERY    . GASTRIC BYPASS    . HEMATOMA EVACUATION Left 04/05/2019   Procedure: Evacuation Hematoma Left Lower Leg;  Surgeon: Erle Crocker, MD;  Location: [place];  Service: Orthopedics;  Laterality: Left;  . HEMORRHOID SURGERY     over 30 years ago  . HERNIA REPAIR     LIH umb  . NASAL SINUS SURGERY     x2  . NECK SURGERY      Current Medications: Current Meds  Medication Sig  . aspirin EC 81 MG tablet Take 81 mg by mouth in the morning and at bedtime.  Marland Kitchen atorvastatin (LIPITOR) 20 MG tablet Take 20 mg by mouth daily at 6 PM.   . Brexpiprazole (REXULTI) 2 MG TABS Take 2 mg by mouth daily.   Marland Kitchen buPROPion (WELLBUTRIN SR) 200 MG 12 hr tablet Take 200 mg by mouth 2 (two) times daily.  . busPIRone (BUSPAR) 10 MG tablet Take 20 mg by mouth 2 (two) times daily.   Marland Kitchen  calcium citrate (CALCITRATE - DOSED IN MG ELEMENTAL CALCIUM) 950 MG tablet Take 200 mg of elemental calcium by mouth daily.  . calcium-vitamin D (CAL-CITRATE PLUS VITAMIN D) 250-100 MG-UNIT tablet Take 1 tablet by mouth daily.  . Cholecalciferol 125 MCG (5000 UT) TABS Take 125 mcg by mouth daily.  . clonazePAM (KLONOPIN) 0.5 MG tablet Take 0.5 mg by mouth 2 (two) times daily as needed for anxiety.  . clonazePAM (KLONOPIN) 1 MG tablet Take 1 mg by mouth at bedtime. PRN  . Docusate Sodium (DSS) 100 MG CAPS Take 100 mg by mouth in the morning and at bedtime. Hold for diarrhea  . DULoxetine (CYMBALTA) 60 MG capsule Take 60 mg by mouth 2 (two) times daily.   Eduard Roux (AIMOVIG) 140 MG/ML SOAJ Inject 140 mg into the skin every 30 (thirty) days.  . fexofenadine (ALLEGRA) 180 MG tablet Take 180 mg by mouth daily.  . finasteride (PROSCAR) 5 MG tablet Take 5 mg by mouth daily.  . fluticasone  (FLONASE) 50 MCG/ACT nasal spray Place 2 sprays into the nose daily. PRN  . folic acid (FOLVITE) 1 MG tablet Take 1 mg by mouth daily.  . furosemide (LASIX) 20 MG tablet Take 1 tablet (20 mg total) by mouth as needed (for leg swelling).  . gabapentin (NEURONTIN) 600 MG tablet Take 600-1,200 mg by mouth 3 (three) times daily.  Marland Kitchen levothyroxine (SYNTHROID) 125 MCG tablet Take 125 mcg by mouth daily.   . Magnesium 500 MG CAPS Take 500 mg by mouth 2 (two) times a day.   . metFORMIN (GLUCOPHAGE) 1000 MG tablet Take 1,000 mg by mouth 2 (two) times daily with a meal.  . metoprolol succinate (TOPROL-XL) 25 MG 24 hr tablet Take 12.5 mg by mouth in the morning and at bedtime.  . mirtazapine (REMERON) 30 MG tablet Take 30 mg by mouth at bedtime.   . Multiple Vitamin (MULTIVITAMIN WITH MINERALS) TABS tablet Take 1 tablet by mouth daily.  . Multiple Vitamins-Minerals (MULTIVITAMIN WITH MINERALS) tablet Take 1 tablet by mouth daily.  . multivitamin (VIT W/EXTRA C) CHEW chewable tablet Chew 1 tablet by mouth daily.  Marland Kitchen omeprazole (PRILOSEC) 20 MG capsule Take 20 mg by mouth 2 (two) times daily before a meal.   . oxyCODONE-acetaminophen (PERCOCET) 10-325 MG tablet Take 1 tablet by mouth 3 (three) times daily.  . potassium chloride SA (KLOR-CON) 20 MEQ tablet Take 1 tablet (20 mEq total) by mouth as needed (when taking furosemide).  . pramipexole (MIRAPEX) 1 MG tablet Take 1 mg by mouth at bedtime.   . pseudoephedrine (SUDOGEST) 30 MG tablet TAKE 1 TABLET BY MOUTH EVERY 12 HOURS AS NEEDED FOR CONGESTION.  Marland Kitchen Semaglutide (OZEMPIC, 0.25 OR 0.5 MG/DOSE, [place]) Inject 0.25 mg into the skin once a week.  . silodosin (RAPAFLO) 8 MG CAPS capsule Take 8 mg by mouth daily with breakfast.  . SUMAtriptan (IMITREX) 6 MG/0.5ML SOLN injection Inject 0.5 mLs (6 mg total) into the skin every 2 (two) hours as needed for migraine or headache (Maximum 2 injections in 24 hours.). May repeat in 2 hours if headache persists or recurs.  Marland Kitchen  tiZANidine (ZANAFLEX) 4 MG capsule Take 4-8 mg by mouth 3 (three) times daily as needed for muscle spasms.  . traMADol (ULTRAM) 50 MG tablet Take 50 mg by mouth 3 (three) times daily.  Marland Kitchen Ubrogepant (UBRELVY) 100 MG TABS Take 1 tablet by mouth as needed (May repeat dose after 2 hours.  Maximum 2 tablets in 24 hours.).  Marland Kitchen  zolmitriptan (ZOMIG) 5 MG tablet Take 5 mg by mouth as needed for migraine.     Allergies:   Morphine   Social History   Socioeconomic History  . Marital status: Married    Spouse name: susan   . Number of children: 1  . Years of education: Not on file  . Highest education level: Bachelor's degree (e.g., BA, AB, BS)  Occupational History  . Not on file  Tobacco Use  . Smoking status: Never Smoker  . Smokeless tobacco: Never Used  Substance and Sexual Activity  . Alcohol use: Yes    Alcohol/week: 1.0 standard drinks    Types: 1 Glasses of wine per week    Comment: occ.  . Drug use: No  . Sexual activity: Not on file  Other Topics Concern  . Not on file  Social History Narrative   Social Review      Patient lives at home with. Spouse [name]   Pt lives in a one or two story home? TWO Story patient lives on first story of home   Does patient lives in a facility? If so where? Private home   What is patient highest level of education? Bachelors degree   Is patient RIGHT or LEFT handed? Right Hand   Does patient drink coffee, tea, soda? Coffee, Tea   How much coffee, tea, soda does patient drink? Coffee in AM, Tea off and on all day   Does patient exercise regularly? No    Social Determinants of Health   Financial Resource Strain:   . Difficulty of Paying Living Expenses:   Food Insecurity:   . Worried About Charity fundraiser in the Last Year:   . Arboriculturist in the Last Year:   Transportation Needs:   . Film/video editor (Medical):   Marland Kitchen Lack of Transportation (Non-Medical):   Physical Activity:   . Days of Exercise per Week:   . Minutes of  Exercise per Session:   Stress:   . Feeling of Stress :   Social Connections:   . Frequency of Communication with Friends and Family:   . Frequency of Social Gatherings with Friends and Family:   . Attends Religious Services:   . Active Member of Clubs or Organizations:   . Attends Archivist Meetings:   Marland Kitchen Marital Status:      Family History: The patient's family history includes Anxiety disorder in his daughter; Cancer in his mother; Depression in his daughter; Diabetes in his brother, brother, father, and mother; Heart disease in his brother and father; Hypertension in his brother and brother; Kidney disease in his father; Osteoarthritis in his brother; Rheum arthritis in his brother and mother.  ROS:   Please see the history of present illness.    All other systems reviewed and are negative.  EKGs/Labs/Other Studies Reviewed:    The following studies were reviewed today: IMPRESSIONS    1. The left ventricle has hyperdynamic systolic function, with an  ejection fraction of >65%. The cavity size was normal. There is mildly  increased left ventricular wall thickness. Left ventricular diastolic  Doppler parameters are consistent with  pseudonormalization. No evidence of left ventricular regional wall motion  abnormalities.  2. The right ventricle has normal systolic function. The cavity was  normal. There is no increase in right ventricular wall thickness.  3. Moderate thickening of the mitral valve leaflet. Mitral valve  regurgitation is mild to moderate by color flow Doppler. The MR jet  is  posteriorly-directed.  4. A bioprosthesis valve is present in the aortic position. Normal aortic  valve prosthesis.  5. Bioprosthetic aortic valve - peak velocity is 2.95m/s and mean gradient  is 70mmHg.    Recent Labs: 02/14/2019: B Natriuretic Peptide 43.7 07/12/2019: TSH 2.240 09/01/2019: ALT 25; BUN 12; Creatinine, Ser 1.07; Hemoglobin 12.6; Platelets 249; Potassium  4.3; Sodium 136  Recent Lipid Panel    Component Value Date/Time   CHOL 161 07/12/2019 0910   TRIG 99 07/12/2019 0910   HDL 62 07/12/2019 0910   CHOLHDL 2.6 07/12/2019 0910   CHOLHDL 2.3 07/28/2011 1630   VLDL 24 07/28/2011 1630   LDLCALC 81 07/12/2019 0910    Physical Exam:    VS:  BP (!) 154/90   Pulse 69   Temp (!) 97.4 F (36.3 C)   Ht 6\' 3"  (1.905 m)   Wt 214 lb (97.1 kg)   SpO2 98%   BMI 26.75 kg/m     Wt Readings from Last 3 Encounters:  12/28/19 214 lb (97.1 kg)  08/07/19 210 lb (95.3 kg)  07/12/19 215 lb (97.5 kg)     GEN: Patient is in no acute distress HEENT: Normal NECK: No JVD; No carotid bruits LYMPHATICS: No lymphadenopathy CARDIAC: Hear sounds regular, 2/6 systolic murmur at the apex. RESPIRATORY:  Clear to auscultation without rales, wheezing or rhonchi  ABDOMEN: Soft, non-tender, non-distended MUSCULOSKELETAL:  No edema; No deformity  SKIN: Warm and dry NEUROLOGIC:  Alert and oriented x 3 PSYCHIATRIC:  Normal affect   Signed, Jenean Lindau, MD  12/28/2019 9:17 AM    [place]

## 2019-12-28 NOTE — Patient Instructions (Signed)

## 2020-01-04 DIAGNOSIS — E11610 Type 2 diabetes mellitus with diabetic neuropathic arthropathy: Secondary | ICD-10-CM | POA: Diagnosis not present

## 2020-01-04 DIAGNOSIS — M24575 Contracture, left foot: Secondary | ICD-10-CM | POA: Diagnosis not present

## 2020-01-04 DIAGNOSIS — M792 Neuralgia and neuritis, unspecified: Secondary | ICD-10-CM | POA: Diagnosis not present

## 2020-01-04 DIAGNOSIS — M2042 Other hammer toe(s) (acquired), left foot: Secondary | ICD-10-CM | POA: Diagnosis not present

## 2020-01-04 DIAGNOSIS — M21962 Unspecified acquired deformity of left lower leg: Secondary | ICD-10-CM | POA: Diagnosis not present

## 2020-01-10 DIAGNOSIS — F419 Anxiety disorder, unspecified: Secondary | ICD-10-CM | POA: Diagnosis not present

## 2020-01-10 DIAGNOSIS — N400 Enlarged prostate without lower urinary tract symptoms: Secondary | ICD-10-CM | POA: Diagnosis not present

## 2020-01-10 DIAGNOSIS — I4589 Other specified conduction disorders: Secondary | ICD-10-CM | POA: Diagnosis not present

## 2020-01-10 DIAGNOSIS — F3341 Major depressive disorder, recurrent, in partial remission: Secondary | ICD-10-CM | POA: Diagnosis not present

## 2020-01-10 DIAGNOSIS — E559 Vitamin D deficiency, unspecified: Secondary | ICD-10-CM | POA: Diagnosis not present

## 2020-01-10 DIAGNOSIS — T148XXA Other injury of unspecified body region, initial encounter: Secondary | ICD-10-CM | POA: Diagnosis not present

## 2020-01-10 DIAGNOSIS — E785 Hyperlipidemia, unspecified: Secondary | ICD-10-CM | POA: Diagnosis not present

## 2020-01-10 DIAGNOSIS — R000 Tachycardia, unspecified: Secondary | ICD-10-CM | POA: Diagnosis not present

## 2020-01-10 DIAGNOSIS — K219 Gastro-esophageal reflux disease without esophagitis: Secondary | ICD-10-CM | POA: Diagnosis not present

## 2020-01-13 LAB — CUP PACEART REMOTE DEVICE CHECK
Battery Remaining Longevity: 133 mo
Battery Remaining Percentage: 95.5 %
Battery Voltage: 3.02 V
Brady Statistic AP VP Percent: 1 %
Brady Statistic AP VS Percent: 1 %
Brady Statistic AS VP Percent: 1 %
Brady Statistic AS VS Percent: 99 %
Brady Statistic RA Percent Paced: 1 %
Brady Statistic RV Percent Paced: 1 %
Date Time Interrogation Session: 20210506063356
Implantable Lead Implant Date: 20180710
Implantable Lead Implant Date: 20180710
Implantable Lead Location: 753859
Implantable Lead Location: 753860
Implantable Pulse Generator Implant Date: 20180710
Lead Channel Impedance Value: 480 Ohm
Lead Channel Impedance Value: 480 Ohm
Lead Channel Pacing Threshold Amplitude: 0.5 V
Lead Channel Pacing Threshold Amplitude: 0.875 V
Lead Channel Pacing Threshold Pulse Width: 0.4 ms
Lead Channel Pacing Threshold Pulse Width: 0.4 ms
Lead Channel Sensing Intrinsic Amplitude: 1 mV
Lead Channel Sensing Intrinsic Amplitude: 12 mV
Lead Channel Setting Pacing Amplitude: 1.125
Lead Channel Setting Pacing Amplitude: 2 V
Lead Channel Setting Pacing Pulse Width: 0.4 ms
Lead Channel Setting Sensing Sensitivity: 2 mV
Pulse Gen Model: 2272
Pulse Gen Serial Number: 8912213

## 2020-01-14 ENCOUNTER — Ambulatory Visit (INDEPENDENT_AMBULATORY_CARE_PROVIDER_SITE_OTHER): Payer: Medicare PPO | Admitting: *Deleted

## 2020-01-14 DIAGNOSIS — I442 Atrioventricular block, complete: Secondary | ICD-10-CM | POA: Diagnosis not present

## 2020-01-14 NOTE — Progress Notes (Signed)
Remote pacemaker transmission.   

## 2020-01-28 DIAGNOSIS — E785 Hyperlipidemia, unspecified: Secondary | ICD-10-CM | POA: Diagnosis not present

## 2020-01-28 DIAGNOSIS — F419 Anxiety disorder, unspecified: Secondary | ICD-10-CM | POA: Diagnosis not present

## 2020-01-28 DIAGNOSIS — E538 Deficiency of other specified B group vitamins: Secondary | ICD-10-CM | POA: Diagnosis not present

## 2020-01-28 DIAGNOSIS — T148XXA Other injury of unspecified body region, initial encounter: Secondary | ICD-10-CM | POA: Diagnosis not present

## 2020-01-28 DIAGNOSIS — N400 Enlarged prostate without lower urinary tract symptoms: Secondary | ICD-10-CM | POA: Diagnosis not present

## 2020-01-28 DIAGNOSIS — R000 Tachycardia, unspecified: Secondary | ICD-10-CM | POA: Diagnosis not present

## 2020-01-28 DIAGNOSIS — F3341 Major depressive disorder, recurrent, in partial remission: Secondary | ICD-10-CM | POA: Diagnosis not present

## 2020-01-28 DIAGNOSIS — E559 Vitamin D deficiency, unspecified: Secondary | ICD-10-CM | POA: Diagnosis not present

## 2020-01-28 DIAGNOSIS — K219 Gastro-esophageal reflux disease without esophagitis: Secondary | ICD-10-CM | POA: Diagnosis not present

## 2020-01-28 DIAGNOSIS — E291 Testicular hypofunction: Secondary | ICD-10-CM | POA: Diagnosis not present

## 2020-02-05 NOTE — Progress Notes (Signed)
Virtual Visit via Video Note The purpose of this virtual visit is to provide medical care while limiting exposure to the novel coronavirus.    Consent was obtained for video visit:  Yes.   Answered questions that patient had about telehealth interaction:  Yes.   I discussed the limitations, risks, security and privacy concerns of performing an evaluation and management service by telemedicine. I also discussed with the patient that there may be a patient responsible charge related to this service. The patient expressed understanding and agreed to proceed.  Pt location: Home Physician Location: office Name of referring provider:  Cher Nakai, MD I connected with Antonio Oliver at patients initiation/request on 02/07/2020 at  8:30 AM EDT by video enabled telemedicine application and verified that I am speaking with the correct person using two identifiers. Pt MRN:  [id number] Pt DOB:  September 17, 1955 Video Participants:  Antonio Oliver   History of Present Illness:  Antonio Oliver is a 64 year old male with complete heart block s/p PPM,possiblerheumatoid arthritis, diabetes, depression/anxiety, chronic back pain and history of cervical myelopathy who follows up for migraines.  UPDATE: Started having increased headaches 6 weeks ago.  Cannot identify any trigger.   Intensity:  moderate Duration:  30 minutes to a few hours Frequency:  5 migraines in month of May.   Rescue therapy: Ubrelvy Current NSAIDS:ASA 81mg  Current analgesics:tramadol (daily, for generalized pain); oxycodone (for general pain) Current triptans:none Current ergotamine:none Current anti-emetic:none Current muscle relaxants:tizanidine Current anti-anxiolytic:BuSpar; clonazepam Current sleep aide:none Current Antihypertensive medications:Toprol-XL, Lasix Current Antidepressant medications:Cymbalta 60mg  twice daily, Wellbutrin SR 200mg  twice daily, Remeron 60mg  Current Anticonvulsant  medications:gabapentin 600mg  TID Current anti-CGRP:Aimovig 140mg  monthly; Ubrelvy 100mg  Current Vitamins/Herbal/Supplements:Iron, folic acid, MVI Current Antihistamines/Decongestants:Sudafed Other therapy:none Other medications:Mirapex  Caffeine:1 cup coffee daily. Diet:Does not drink water or soda. Exercise:no Depression:yes; Anxiety:Yes. Major depressive disorder. Needed to retire in 2002.  Other pain:Arthralgias, chronic neck pain, Charcot foot Sleep hygiene:poor  HISTORY: Onset:  1980s Location:Usually bifrontal Quality:Squeezing, throbbing, pounding Initial Intensity:7-8/10.Hedenies new headache, thunderclap headache or severe headache that wakes from sleep. Aura:none Premonitory Phase:none Postdrome:none Associated symptoms:Photophobia, phonophobia, blurred vision.Hedenies associated nausea, vomiting,unilateral numbness or weakness. Initial Duration:Usually a day. Has happened up to 3 days. Initial Frequency:10 to 15 days a month, started Botox in 2018 and improved once a month. Initial Frequency of abortive medication:1 a month Triggers:  Unknown Relieving factors:none Activity:aggravates  CT head 11/14/18 personally reviewed and was normal. CT cervical spine 11/06/18 personally reviewed and demonstrated multilevel severe facet arthropathy with moderate bilateral neural foraminal stenosis at C3-4 and C4-5 as well as prior interbody fusion at C6-7 without stenosis.  Past NSAIDS:ibuprofen Past analgesics:Tylenol, Excedrin, Fioricet, Midrin Past abortive triptans:Zomig 5mg ,Sumatriptan 6mg  [place] Past abortive ergotamine:none Past muscle relaxants:Zanaflex Past anti-emetic:none Past antihypertensive medications:Verapamil, propranolol, clonidine Past antidepressant medications:amitriptyline Past anticonvulsant medications:topiramate 200mg  Past anti-CGRP:none Past  vitamins/Herbal/Supplements:none Past antihistamines/decongestants:none Other past therapies:Botox (effective but did not like getting all those injections every 3 months)  Family history of headache:2 brothers had migraines.  Past Medical History: Past Medical History:  Diagnosis Date  . Anxiety 10/30/2018  . Ascending aorta dilation ([place]) 03/06/2019  . Bicuspid aortic valve 12/22/2016  . BPH (benign prostatic hyperplasia)   . Bradycardia 02/01/2019  . Cervical myelopathy ([place]) 11/14/2018  . Chronic back pain 04/03/2013  . Chronic bilateral low back pain 11/17/2016  . Chronic inflammatory arthritis 11/17/2016   History of positive rheumatoid factor in the past. Denies placement on immunosuppressive therapy  . Compartment syndrome ([place])  06/20/2013   Last Assessment & Plan:  Denies any problems with right leg since April/May Does have swelling at times if he is on this leg for long periods of time. Wears compression socks regularly and has helped with swelling.  . Complete heart block ([place]) 05/04/2017  . Depression   . Diabetes ([place]) 10/30/2018  . Diabetes mellitus   . Diabetes mellitus due to underlying condition with unspecified complications ([place]) [id number]  . Diabetes mellitus type 2 in nonobese ([facility])   . DM2 (diabetes mellitus, type 2) ([place]) 04/03/2013   Last Assessment & Plan:  Formatting of this note might be different from the original. Diabetes is unchanged.  Continue current treatment regimen. Regular aerobic exercise. Diabetes will be reassessed in 3 months. Will check A1c today. Denies any problems with feet or sensation.  . Enthesopathy of ankle and tarsus 04/04/2013   Last Assessment & Plan:  Continues with swelling after prolonged standing.  Still  Wearing support hose, which helps.  . Essential hypertension 10/30/2018  . Fatigue 10/30/2018  . FHx: migraine headaches 04/04/2013  . Fracture of capitate bone of wrist 02/26/2019  . GERD (gastroesophageal reflux disease)  10/30/2018  . Glaucoma   . Glaucoma 04/03/2013  . Heart murmur 11/17/2016  . Hemangioma of skin and subcutaneous tissue 07/07/2017  . History of kidney stones 11/17/2016  . Hyperlipidemia 10/30/2018  . Hypertension   . Hypothyroidism 10/30/2018  . Lentigo 07/07/2017  . Lesion of plantar nerve 04/04/2013  . Leukocytosis   . Migraine 11/17/2016  . Mixed dyslipidemia 10/30/2018  . Morbid obesity ([place]) 10/27/2011  . Multiple actinic keratoses 07/07/2017  . Multiple benign melanocytic nevi 07/07/2017  . Neck pain 04/04/2013  . Neoplasm of uncertain behavior of skin 07/28/2018  . Nondisplaced fracture of medial cuneiform of left foot, initial encounter for closed fracture 11/16/2018  . Nonrheumatic aortic valve stenosis 02/01/2019  . Osteoarthritis 10/30/2018  . Other seborrheic keratosis 07/07/2017  . Personal history of tobacco use, presenting hazards to health 04/04/2013  . Presence of permanent cardiac pacemaker   . S/P AVR (aortic valve replacement) 03/10/2017  . Severe single current episode of major depressive disorder, without psychotic features ([place]) 11/17/2016  . Status post bariatric surgery 04/04/2013  . Strain of rotator cuff 04/04/2013  . Surgery, elective   . Syncope 02/14/2019  . Vitamin D deficiency 10/30/2018  . Weakness     Medications: Outpatient Encounter Medications as of 02/07/2020  Medication Sig Note  . aspirin EC 81 MG tablet Take 81 mg by mouth in the morning and at bedtime.   Marland Kitchen atorvastatin (LIPITOR) 20 MG tablet Take 20 mg by mouth daily at 6 PM.    . Brexpiprazole (REXULTI) 2 MG TABS Take 2 mg by mouth daily.    Marland Kitchen buPROPion (WELLBUTRIN SR) 200 MG 12 hr tablet Take 200 mg by mouth 2 (two) times daily.   . busPIRone (BUSPAR) 10 MG tablet Take 20 mg by mouth 2 (two) times daily.    . calcium citrate (CALCITRATE - DOSED IN MG ELEMENTAL CALCIUM) 950 MG tablet Take 200 mg of elemental calcium by mouth daily. 04/05/2019: Not to substitute for carbonate  . calcium-vitamin D (CAL-CITRATE  PLUS VITAMIN D) 250-100 MG-UNIT tablet Take 1 tablet by mouth daily.   . Cholecalciferol 125 MCG (5000 UT) TABS Take 125 mcg by mouth daily.   . clonazePAM (KLONOPIN) 0.5 MG tablet Take 0.5 mg by mouth 2 (two) times daily as needed for anxiety.   . clonazePAM (KLONOPIN)  1 MG tablet Take 1 mg by mouth at bedtime. PRN   . Docusate Sodium (DSS) 100 MG CAPS Take 100 mg by mouth in the morning and at bedtime. Hold for diarrhea   . DULoxetine (CYMBALTA) 60 MG capsule Take 60 mg by mouth 2 (two) times daily.    Eduard Roux (AIMOVIG) 140 MG/ML SOAJ Inject 140 mg into the skin every 30 (thirty) days.   . fexofenadine (ALLEGRA) 180 MG tablet Take 180 mg by mouth daily.   . finasteride (PROSCAR) 5 MG tablet Take 5 mg by mouth daily.   . fluticasone (FLONASE) 50 MCG/ACT nasal spray Place 2 sprays into the nose daily. PRN   . folic acid (FOLVITE) 1 MG tablet Take 1 mg by mouth daily.   . furosemide (LASIX) 20 MG tablet Take 1 tablet (20 mg total) by mouth as needed (for leg swelling).   . gabapentin (NEURONTIN) 600 MG tablet Take 600-1,200 mg by mouth 3 (three) times daily.   Marland Kitchen levothyroxine (SYNTHROID) 125 MCG tablet Take 125 mcg by mouth daily.    . Magnesium 500 MG CAPS Take 500 mg by mouth 2 (two) times a day.    . metFORMIN (GLUCOPHAGE) 1000 MG tablet Take 1,000 mg by mouth 2 (two) times daily with a meal.   . metoprolol succinate (TOPROL-XL) 25 MG 24 hr tablet Take 12.5 mg by mouth in the morning and at bedtime.   . mirtazapine (REMERON) 30 MG tablet Take 30 mg by mouth at bedtime.    . Multiple Vitamin (MULTIVITAMIN WITH MINERALS) TABS tablet Take 1 tablet by mouth daily.   . Multiple Vitamins-Minerals (MULTIVITAMIN WITH MINERALS) tablet Take 1 tablet by mouth daily.   . multivitamin (VIT W/EXTRA C) CHEW chewable tablet Chew 1 tablet by mouth daily.   Marland Kitchen omeprazole (PRILOSEC) 20 MG capsule Take 20 mg by mouth 2 (two) times daily before a meal.    . oxyCODONE-acetaminophen (PERCOCET) 10-325 MG  tablet Take 1 tablet by mouth 3 (three) times daily.   . potassium chloride SA (KLOR-CON) 20 MEQ tablet Take 1 tablet (20 mEq total) by mouth as needed (when taking furosemide).   . pramipexole (MIRAPEX) 1 MG tablet Take 1 mg by mouth at bedtime.    . pseudoephedrine (SUDOGEST) 30 MG tablet TAKE 1 TABLET BY MOUTH EVERY 12 HOURS AS NEEDED FOR CONGESTION.   Marland Kitchen Semaglutide (OZEMPIC, 0.25 OR 0.5 MG/DOSE, [place]) Inject 0.25 mg into the skin once a week.   . silodosin (RAPAFLO) 8 MG CAPS capsule Take 8 mg by mouth daily with breakfast.   . SUMAtriptan (IMITREX) 6 MG/0.5ML SOLN injection Inject 0.5 mLs (6 mg total) into the skin every 2 (two) hours as needed for migraine or headache (Maximum 2 injections in 24 hours.). May repeat in 2 hours if headache persists or recurs.   Marland Kitchen tiZANidine (ZANAFLEX) 4 MG capsule Take 4-8 mg by mouth 3 (three) times daily as needed for muscle spasms.   . traMADol (ULTRAM) 50 MG tablet Take 50 mg by mouth 3 (three) times daily.   Marland Kitchen Ubrogepant (UBRELVY) 100 MG TABS Take 1 tablet by mouth as needed (May repeat dose after 2 hours.  Maximum 2 tablets in 24 hours.).   Marland Kitchen zolmitriptan (ZOMIG) 5 MG tablet Take 5 mg by mouth as needed for migraine.    No facility-administered encounter medications on file as of 02/07/2020.    Allergies: Allergies  Allergen Reactions  . Morphine Itching    Can take with benadry, facial  and nose itching  Can take with benadry, facial and nose itching Can take with benadry, facial and nose itching    Family History: Family History  Problem Relation Age of Onset  . Cancer Mother        breast  . Rheum arthritis Mother   . Diabetes Mother   . Heart disease Father   . Kidney disease Father   . Diabetes Father   . Hypertension Brother   . Diabetes Brother   . Osteoarthritis Brother   . Heart disease Brother   . Hypertension Brother   . Diabetes Brother   . Rheum arthritis Brother   . Depression Daughter   . Anxiety disorder Daughter      Social History: Social History   Socioeconomic History  . Marital status: Married    Spouse name: susan   . Number of children: 1  . Years of education: Not on file  . Highest education level: Bachelor's degree (e.g., BA, AB, BS)  Occupational History  . Not on file  Tobacco Use  . Smoking status: Never Smoker  . Smokeless tobacco: Never Used  Substance and Sexual Activity  . Alcohol use: Yes    Alcohol/week: 1.0 standard drinks    Types: 1 Glasses of wine per week    Comment: occ.  . Drug use: No  . Sexual activity: Not on file  Other Topics Concern  . Not on file  Social History Narrative   Social Review      Patient lives at home with. Spouse [name]   Pt lives in a one or two story home? TWO Story patient lives on first story of home   Does patient lives in a facility? If so where? Private home   What is patient highest level of education? Bachelors degree   Is patient RIGHT or LEFT handed? Right Hand   Does patient drink coffee, tea, soda? Coffee, Tea   How much coffee, tea, soda does patient drink? Coffee in AM, Tea off and on all day   Does patient exercise regularly? No    Social Determinants of Health   Financial Resource Strain:   . Difficulty of Paying Living Expenses:   Food Insecurity:   . Worried About Charity fundraiser in the Last Year:   . Arboriculturist in the Last Year:   Transportation Needs:   . Film/video editor (Medical):   Marland Kitchen Lack of Transportation (Non-Medical):   Physical Activity:   . Days of Exercise per Week:   . Minutes of Exercise per Session:   Stress:   . Feeling of Stress :   Social Connections:   . Frequency of Communication with Friends and Family:   . Frequency of Social Gatherings with Friends and Family:   . Attends Religious Services:   . Active Member of Clubs or Organizations:   . Attends Archivist Meetings:   Marland Kitchen Marital Status:   Intimate Partner Violence: Not At Risk  . Fear of Current or  Ex-Partner: No  . Emotionally Abused: No  . Physically Abused: No  . Sexually Abused: No    Observations/Objective:   There were no vitals taken for this visit. No acute distress.  Alert and oriented.  Speech fluent and not dysarthric.  Language intact.  Eyes orthophoric on primary gaze.  Face symmetric.  Assessment and Plan:   Migraine without aura, without status migrainosus, not intractable, increased frequency  1.  For preventative management, switch from  Aimovig to [employer].  He will pick up starting dose sample today. 2.  For abortive therapy, Ubrelvy 3.  Limit use of pain relievers to no more than 2 days out of week to prevent risk of rebound or medication-overuse headache. 4.  Keep headache diary 5.  Exercise, hydration, caffeine cessation, sleep hygiene, monitor for and avoid triggers 6.  Follow up 4 months.   Follow Up Instructions:    -I discussed the assessment and treatment plan with the patient. The patient was provided an opportunity to ask questions and all were answered. The patient agreed with the plan and demonstrated an understanding of the instructions.   The patient was advised to call back or seek an in-person evaluation if the symptoms worsen or if the condition fails to improve as anticipated.    Dudley Major, DO \

## 2020-02-07 ENCOUNTER — Encounter: Payer: Self-pay | Admitting: Neurology

## 2020-02-07 ENCOUNTER — Telehealth (INDEPENDENT_AMBULATORY_CARE_PROVIDER_SITE_OTHER): Payer: Medicare PPO | Admitting: Neurology

## 2020-02-07 ENCOUNTER — Other Ambulatory Visit: Payer: Self-pay

## 2020-02-07 DIAGNOSIS — G43009 Migraine without aura, not intractable, without status migrainosus: Secondary | ICD-10-CM | POA: Diagnosis not present

## 2020-02-07 MED ORDER — EMGALITY 120 MG/ML ~~LOC~~ SOAJ: 120.0000 mg | SUBCUTANEOUS | 11 refills | Status: DC

## 2020-02-07 NOTE — Progress Notes (Addendum)
Venetia Night KeyLindley Magnus - PA Case ID: [id number] - Rx #[id number] Need help? Call us at [phone] Outcome Approvedtoday PA Case: [id number], Status: Approved, Coverage Starts on: 02/07/2020 12:00:00 AM, Coverage Ends on: 05/07/2020 12:00:00 AM. Questions? Contact [phone]. Drug Emgality 120MG /ML auto-injectors (migraine) Form Administrator, sports PA Form [employer] Info [phone]

## 2020-02-26 DIAGNOSIS — N400 Enlarged prostate without lower urinary tract symptoms: Secondary | ICD-10-CM | POA: Diagnosis not present

## 2020-02-26 DIAGNOSIS — E785 Hyperlipidemia, unspecified: Secondary | ICD-10-CM | POA: Diagnosis not present

## 2020-03-14 DIAGNOSIS — M21962 Unspecified acquired deformity of left lower leg: Secondary | ICD-10-CM | POA: Diagnosis not present

## 2020-03-14 DIAGNOSIS — M792 Neuralgia and neuritis, unspecified: Secondary | ICD-10-CM | POA: Diagnosis not present

## 2020-03-14 DIAGNOSIS — M14672 Charcot's joint, left ankle and foot: Secondary | ICD-10-CM | POA: Diagnosis not present

## 2020-03-14 DIAGNOSIS — M24575 Contracture, left foot: Secondary | ICD-10-CM | POA: Diagnosis not present

## 2020-03-14 DIAGNOSIS — E11610 Type 2 diabetes mellitus with diabetic neuropathic arthropathy: Secondary | ICD-10-CM | POA: Diagnosis not present

## 2020-03-14 DIAGNOSIS — M2042 Other hammer toe(s) (acquired), left foot: Secondary | ICD-10-CM | POA: Diagnosis not present

## 2020-03-24 DIAGNOSIS — E559 Vitamin D deficiency, unspecified: Secondary | ICD-10-CM | POA: Diagnosis not present

## 2020-03-24 DIAGNOSIS — F3341 Major depressive disorder, recurrent, in partial remission: Secondary | ICD-10-CM | POA: Diagnosis not present

## 2020-03-24 DIAGNOSIS — R000 Tachycardia, unspecified: Secondary | ICD-10-CM | POA: Diagnosis not present

## 2020-03-24 DIAGNOSIS — F419 Anxiety disorder, unspecified: Secondary | ICD-10-CM | POA: Diagnosis not present

## 2020-03-24 DIAGNOSIS — E785 Hyperlipidemia, unspecified: Secondary | ICD-10-CM | POA: Diagnosis not present

## 2020-03-24 DIAGNOSIS — N400 Enlarged prostate without lower urinary tract symptoms: Secondary | ICD-10-CM | POA: Diagnosis not present

## 2020-03-24 DIAGNOSIS — T148XXA Other injury of unspecified body region, initial encounter: Secondary | ICD-10-CM | POA: Diagnosis not present

## 2020-03-24 DIAGNOSIS — E291 Testicular hypofunction: Secondary | ICD-10-CM | POA: Diagnosis not present

## 2020-03-24 DIAGNOSIS — K219 Gastro-esophageal reflux disease without esophagitis: Secondary | ICD-10-CM | POA: Diagnosis not present

## 2020-04-08 DIAGNOSIS — F419 Anxiety disorder, unspecified: Secondary | ICD-10-CM | POA: Diagnosis not present

## 2020-04-08 DIAGNOSIS — F3341 Major depressive disorder, recurrent, in partial remission: Secondary | ICD-10-CM | POA: Diagnosis not present

## 2020-04-08 DIAGNOSIS — E118 Type 2 diabetes mellitus with unspecified complications: Secondary | ICD-10-CM | POA: Diagnosis not present

## 2020-04-08 DIAGNOSIS — K219 Gastro-esophageal reflux disease without esophagitis: Secondary | ICD-10-CM | POA: Diagnosis not present

## 2020-04-08 DIAGNOSIS — I10 Essential (primary) hypertension: Secondary | ICD-10-CM | POA: Diagnosis not present

## 2020-04-08 DIAGNOSIS — E785 Hyperlipidemia, unspecified: Secondary | ICD-10-CM | POA: Diagnosis not present

## 2020-04-08 DIAGNOSIS — R000 Tachycardia, unspecified: Secondary | ICD-10-CM | POA: Diagnosis not present

## 2020-04-08 DIAGNOSIS — E559 Vitamin D deficiency, unspecified: Secondary | ICD-10-CM | POA: Diagnosis not present

## 2020-04-08 DIAGNOSIS — E291 Testicular hypofunction: Secondary | ICD-10-CM | POA: Diagnosis not present

## 2020-04-08 DIAGNOSIS — T148XXA Other injury of unspecified body region, initial encounter: Secondary | ICD-10-CM | POA: Diagnosis not present

## 2020-04-08 DIAGNOSIS — N400 Enlarged prostate without lower urinary tract symptoms: Secondary | ICD-10-CM | POA: Diagnosis not present

## 2020-04-25 DIAGNOSIS — F3341 Major depressive disorder, recurrent, in partial remission: Secondary | ICD-10-CM | POA: Diagnosis not present

## 2020-04-25 DIAGNOSIS — R000 Tachycardia, unspecified: Secondary | ICD-10-CM | POA: Diagnosis not present

## 2020-04-25 DIAGNOSIS — N400 Enlarged prostate without lower urinary tract symptoms: Secondary | ICD-10-CM | POA: Diagnosis not present

## 2020-04-25 DIAGNOSIS — K219 Gastro-esophageal reflux disease without esophagitis: Secondary | ICD-10-CM | POA: Diagnosis not present

## 2020-04-25 DIAGNOSIS — T148XXA Other injury of unspecified body region, initial encounter: Secondary | ICD-10-CM | POA: Diagnosis not present

## 2020-04-25 DIAGNOSIS — E291 Testicular hypofunction: Secondary | ICD-10-CM | POA: Diagnosis not present

## 2020-04-25 DIAGNOSIS — E785 Hyperlipidemia, unspecified: Secondary | ICD-10-CM | POA: Diagnosis not present

## 2020-04-25 DIAGNOSIS — F419 Anxiety disorder, unspecified: Secondary | ICD-10-CM | POA: Diagnosis not present

## 2020-04-25 DIAGNOSIS — E559 Vitamin D deficiency, unspecified: Secondary | ICD-10-CM | POA: Diagnosis not present

## 2020-05-11 ENCOUNTER — Emergency Department (HOSPITAL_COMMUNITY)
Admission: EM | Admit: 2020-05-11 | Discharge: 2020-05-12 | Disposition: A | Discharge status: Home / Self Care | Payer: Medicare PPO | Attending: Emergency Medicine | Admitting: Emergency Medicine

## 2020-05-11 ENCOUNTER — Encounter (HOSPITAL_COMMUNITY): Payer: Self-pay | Admitting: Emergency Medicine

## 2020-05-11 ENCOUNTER — Emergency Department (HOSPITAL_COMMUNITY): Payer: Medicare PPO

## 2020-05-11 ENCOUNTER — Other Ambulatory Visit: Payer: Self-pay

## 2020-05-11 DIAGNOSIS — Y9289 Other specified places as the place of occurrence of the external cause: Secondary | ICD-10-CM | POA: Diagnosis not present

## 2020-05-11 DIAGNOSIS — Y999 Unspecified external cause status: Secondary | ICD-10-CM | POA: Diagnosis not present

## 2020-05-11 DIAGNOSIS — Y939 Activity, unspecified: Secondary | ICD-10-CM | POA: Insufficient documentation

## 2020-05-11 DIAGNOSIS — S0990XA Unspecified injury of head, initial encounter: Secondary | ICD-10-CM | POA: Insufficient documentation

## 2020-05-11 DIAGNOSIS — W19XXXA Unspecified fall, initial encounter: Secondary | ICD-10-CM | POA: Diagnosis not present

## 2020-05-11 DIAGNOSIS — Z5321 Procedure and treatment not carried out due to patient leaving prior to being seen by health care provider: Secondary | ICD-10-CM | POA: Insufficient documentation

## 2020-05-11 DIAGNOSIS — S7011XA Contusion of right thigh, initial encounter: Secondary | ICD-10-CM | POA: Diagnosis not present

## 2020-05-11 DIAGNOSIS — Z043 Encounter for examination and observation following other accident: Secondary | ICD-10-CM | POA: Diagnosis not present

## 2020-05-11 LAB — CBC
HCT: 39.9 % (ref 39.0–52.0)
Hemoglobin: 13.6 g/dL (ref 13.0–17.0)
MCH: 31.6 pg (ref 26.0–34.0)
MCHC: 34.1 g/dL (ref 30.0–36.0)
MCV: 92.8 fL (ref 80.0–100.0)
Platelets: 284 10*3/uL (ref 150–400)
RBC: 4.3 MIL/uL (ref 4.22–5.81)
RDW: 12.2 % (ref 11.5–15.5)
WBC: 18.1 10*3/uL — ABNORMAL HIGH (ref 4.0–10.5)
nRBC: 0 % (ref 0.0–0.2)

## 2020-05-11 LAB — COMPREHENSIVE METABOLIC PANEL
ALT: 25 U/L (ref 0–44)
AST: 29 U/L (ref 15–41)
Albumin: 4.2 g/dL (ref 3.5–5.0)
Alkaline Phosphatase: 81 U/L (ref 38–126)
Anion gap: 10 (ref 5–15)
BUN: 22 mg/dL (ref 8–23)
CO2: 21 mmol/L — ABNORMAL LOW (ref 22–32)
Calcium: 9.1 mg/dL (ref 8.9–10.3)
Chloride: 103 mmol/L (ref 98–111)
Creatinine, Ser: 1.24 mg/dL (ref 0.61–1.24)
GFR calc Af Amer: >60 mL/min (ref >60)
GFR calc non Af Amer: >60 mL/min (ref >60)
Glucose, Bld: 345 mg/dL — ABNORMAL HIGH (ref 70–99)
Potassium: 6 mmol/L — ABNORMAL HIGH (ref 3.5–5.1)
Sodium: 134 mmol/L — ABNORMAL LOW (ref 135–145)
Total Bilirubin: 0.5 mg/dL (ref 0.3–1.2)
Total Protein: 6.7 g/dL (ref 6.5–8.1)

## 2020-05-11 LAB — LIPASE, BLOOD: Lipase: 34 U/L (ref 11–51)

## 2020-05-11 NOTE — ED Triage Notes (Signed)
Patient fell this afternoon and hit there right side of his head and right hip. Patient states the right inner thigh has a hematoma.

## 2020-05-12 DIAGNOSIS — Z981 Arthrodesis status: Secondary | ICD-10-CM | POA: Diagnosis not present

## 2020-05-12 DIAGNOSIS — Z043 Encounter for examination and observation following other accident: Secondary | ICD-10-CM | POA: Diagnosis not present

## 2020-05-12 DIAGNOSIS — G319 Degenerative disease of nervous system, unspecified: Secondary | ICD-10-CM | POA: Diagnosis not present

## 2020-05-12 DIAGNOSIS — E119 Type 2 diabetes mellitus without complications: Secondary | ICD-10-CM | POA: Diagnosis not present

## 2020-05-12 DIAGNOSIS — S39012A Strain of muscle, fascia and tendon of lower back, initial encounter: Secondary | ICD-10-CM | POA: Diagnosis not present

## 2020-05-12 DIAGNOSIS — S199XXA Unspecified injury of neck, initial encounter: Secondary | ICD-10-CM | POA: Diagnosis not present

## 2020-05-12 DIAGNOSIS — S0990XA Unspecified injury of head, initial encounter: Secondary | ICD-10-CM | POA: Diagnosis not present

## 2020-05-12 DIAGNOSIS — J341 Cyst and mucocele of nose and nasal sinus: Secondary | ICD-10-CM | POA: Diagnosis not present

## 2020-05-12 DIAGNOSIS — S7011XA Contusion of right thigh, initial encounter: Secondary | ICD-10-CM | POA: Diagnosis not present

## 2020-05-12 DIAGNOSIS — M542 Cervicalgia: Secondary | ICD-10-CM | POA: Diagnosis not present

## 2020-05-12 DIAGNOSIS — S3991XA Unspecified injury of abdomen, initial encounter: Secondary | ICD-10-CM | POA: Diagnosis not present

## 2020-05-12 DIAGNOSIS — Z79899 Other long term (current) drug therapy: Secondary | ICD-10-CM | POA: Diagnosis not present

## 2020-05-12 DIAGNOSIS — N179 Acute kidney failure, unspecified: Secondary | ICD-10-CM | POA: Diagnosis not present

## 2020-05-12 DIAGNOSIS — M19012 Primary osteoarthritis, left shoulder: Secondary | ICD-10-CM | POA: Diagnosis not present

## 2020-05-12 DIAGNOSIS — M1611 Unilateral primary osteoarthritis, right hip: Secondary | ICD-10-CM | POA: Diagnosis not present

## 2020-05-12 DIAGNOSIS — N200 Calculus of kidney: Secondary | ICD-10-CM | POA: Diagnosis not present

## 2020-05-12 DIAGNOSIS — S46912A Strain of unspecified muscle, fascia and tendon at shoulder and upper arm level, left arm, initial encounter: Secondary | ICD-10-CM | POA: Diagnosis not present

## 2020-05-12 DIAGNOSIS — S7001XA Contusion of right hip, initial encounter: Secondary | ICD-10-CM | POA: Diagnosis not present

## 2020-05-12 DIAGNOSIS — S161XXA Strain of muscle, fascia and tendon at neck level, initial encounter: Secondary | ICD-10-CM | POA: Diagnosis not present

## 2020-05-12 DIAGNOSIS — M7981 Nontraumatic hematoma of soft tissue: Secondary | ICD-10-CM | POA: Diagnosis not present

## 2020-05-19 DIAGNOSIS — E291 Testicular hypofunction: Secondary | ICD-10-CM | POA: Diagnosis not present

## 2020-05-19 DIAGNOSIS — K219 Gastro-esophageal reflux disease without esophagitis: Secondary | ICD-10-CM | POA: Diagnosis not present

## 2020-05-19 DIAGNOSIS — R000 Tachycardia, unspecified: Secondary | ICD-10-CM | POA: Diagnosis not present

## 2020-05-19 DIAGNOSIS — E785 Hyperlipidemia, unspecified: Secondary | ICD-10-CM | POA: Diagnosis not present

## 2020-05-19 DIAGNOSIS — F3341 Major depressive disorder, recurrent, in partial remission: Secondary | ICD-10-CM | POA: Diagnosis not present

## 2020-05-19 DIAGNOSIS — S301XXA Contusion of abdominal wall, initial encounter: Secondary | ICD-10-CM | POA: Diagnosis not present

## 2020-05-19 DIAGNOSIS — T148XXA Other injury of unspecified body region, initial encounter: Secondary | ICD-10-CM | POA: Diagnosis not present

## 2020-05-19 DIAGNOSIS — N400 Enlarged prostate without lower urinary tract symptoms: Secondary | ICD-10-CM | POA: Diagnosis not present

## 2020-05-19 DIAGNOSIS — E559 Vitamin D deficiency, unspecified: Secondary | ICD-10-CM | POA: Diagnosis not present

## 2020-05-20 DIAGNOSIS — S301XXA Contusion of abdominal wall, initial encounter: Secondary | ICD-10-CM | POA: Diagnosis not present

## 2020-05-20 DIAGNOSIS — K219 Gastro-esophageal reflux disease without esophagitis: Secondary | ICD-10-CM | POA: Diagnosis not present

## 2020-05-20 DIAGNOSIS — E291 Testicular hypofunction: Secondary | ICD-10-CM | POA: Diagnosis not present

## 2020-05-20 DIAGNOSIS — N400 Enlarged prostate without lower urinary tract symptoms: Secondary | ICD-10-CM | POA: Diagnosis not present

## 2020-05-20 DIAGNOSIS — E559 Vitamin D deficiency, unspecified: Secondary | ICD-10-CM | POA: Diagnosis not present

## 2020-05-20 DIAGNOSIS — E785 Hyperlipidemia, unspecified: Secondary | ICD-10-CM | POA: Diagnosis not present

## 2020-05-20 DIAGNOSIS — F3341 Major depressive disorder, recurrent, in partial remission: Secondary | ICD-10-CM | POA: Diagnosis not present

## 2020-05-20 DIAGNOSIS — T148XXA Other injury of unspecified body region, initial encounter: Secondary | ICD-10-CM | POA: Diagnosis not present

## 2020-05-20 DIAGNOSIS — R000 Tachycardia, unspecified: Secondary | ICD-10-CM | POA: Diagnosis not present

## 2020-05-21 DIAGNOSIS — I2510 Atherosclerotic heart disease of native coronary artery without angina pectoris: Secondary | ICD-10-CM | POA: Diagnosis not present

## 2020-05-21 DIAGNOSIS — E119 Type 2 diabetes mellitus without complications: Secondary | ICD-10-CM | POA: Diagnosis not present

## 2020-05-21 DIAGNOSIS — E559 Vitamin D deficiency, unspecified: Secondary | ICD-10-CM | POA: Diagnosis not present

## 2020-05-21 DIAGNOSIS — I10 Essential (primary) hypertension: Secondary | ICD-10-CM | POA: Diagnosis not present

## 2020-05-21 DIAGNOSIS — M109 Gout, unspecified: Secondary | ICD-10-CM | POA: Diagnosis not present

## 2020-05-21 DIAGNOSIS — F329 Major depressive disorder, single episode, unspecified: Secondary | ICD-10-CM | POA: Diagnosis not present

## 2020-05-21 DIAGNOSIS — F419 Anxiety disorder, unspecified: Secondary | ICD-10-CM | POA: Diagnosis not present

## 2020-05-21 DIAGNOSIS — Z950 Presence of cardiac pacemaker: Secondary | ICD-10-CM | POA: Diagnosis not present

## 2020-05-21 DIAGNOSIS — S7011XA Contusion of right thigh, initial encounter: Secondary | ICD-10-CM | POA: Diagnosis not present

## 2020-05-21 DIAGNOSIS — Z794 Long term (current) use of insulin: Secondary | ICD-10-CM | POA: Diagnosis not present

## 2020-05-21 DIAGNOSIS — M059 Rheumatoid arthritis with rheumatoid factor, unspecified: Secondary | ICD-10-CM | POA: Diagnosis not present

## 2020-05-23 DIAGNOSIS — E559 Vitamin D deficiency, unspecified: Secondary | ICD-10-CM | POA: Diagnosis not present

## 2020-05-23 DIAGNOSIS — E291 Testicular hypofunction: Secondary | ICD-10-CM | POA: Diagnosis not present

## 2020-05-23 DIAGNOSIS — S301XXA Contusion of abdominal wall, initial encounter: Secondary | ICD-10-CM | POA: Diagnosis not present

## 2020-05-23 DIAGNOSIS — M159 Polyosteoarthritis, unspecified: Secondary | ICD-10-CM | POA: Diagnosis not present

## 2020-05-23 DIAGNOSIS — E785 Hyperlipidemia, unspecified: Secondary | ICD-10-CM | POA: Diagnosis not present

## 2020-05-23 DIAGNOSIS — R000 Tachycardia, unspecified: Secondary | ICD-10-CM | POA: Diagnosis not present

## 2020-05-23 DIAGNOSIS — N400 Enlarged prostate without lower urinary tract symptoms: Secondary | ICD-10-CM | POA: Diagnosis not present

## 2020-05-23 DIAGNOSIS — F3341 Major depressive disorder, recurrent, in partial remission: Secondary | ICD-10-CM | POA: Diagnosis not present

## 2020-05-23 DIAGNOSIS — T148XXA Other injury of unspecified body region, initial encounter: Secondary | ICD-10-CM | POA: Diagnosis not present

## 2020-05-27 DIAGNOSIS — S8011XA Contusion of right lower leg, initial encounter: Secondary | ICD-10-CM | POA: Diagnosis not present

## 2020-05-27 DIAGNOSIS — S7011XA Contusion of right thigh, initial encounter: Secondary | ICD-10-CM | POA: Diagnosis not present

## 2020-05-30 DIAGNOSIS — N400 Enlarged prostate without lower urinary tract symptoms: Secondary | ICD-10-CM | POA: Diagnosis not present

## 2020-05-30 DIAGNOSIS — I10 Essential (primary) hypertension: Secondary | ICD-10-CM | POA: Diagnosis not present

## 2020-05-30 DIAGNOSIS — S301XXA Contusion of abdominal wall, initial encounter: Secondary | ICD-10-CM | POA: Diagnosis not present

## 2020-05-30 DIAGNOSIS — E785 Hyperlipidemia, unspecified: Secondary | ICD-10-CM | POA: Diagnosis not present

## 2020-05-30 DIAGNOSIS — E291 Testicular hypofunction: Secondary | ICD-10-CM | POA: Diagnosis not present

## 2020-05-30 DIAGNOSIS — M159 Polyosteoarthritis, unspecified: Secondary | ICD-10-CM | POA: Diagnosis not present

## 2020-05-30 DIAGNOSIS — F3341 Major depressive disorder, recurrent, in partial remission: Secondary | ICD-10-CM | POA: Diagnosis not present

## 2020-05-30 DIAGNOSIS — R000 Tachycardia, unspecified: Secondary | ICD-10-CM | POA: Diagnosis not present

## 2020-05-30 DIAGNOSIS — T148XXA Other injury of unspecified body region, initial encounter: Secondary | ICD-10-CM | POA: Diagnosis not present

## 2020-06-04 DIAGNOSIS — S7011XA Contusion of right thigh, initial encounter: Secondary | ICD-10-CM | POA: Diagnosis not present

## 2020-06-06 DIAGNOSIS — T148XXA Other injury of unspecified body region, initial encounter: Secondary | ICD-10-CM | POA: Diagnosis not present

## 2020-06-06 DIAGNOSIS — N400 Enlarged prostate without lower urinary tract symptoms: Secondary | ICD-10-CM | POA: Diagnosis not present

## 2020-06-06 DIAGNOSIS — L0291 Cutaneous abscess, unspecified: Secondary | ICD-10-CM | POA: Diagnosis not present

## 2020-06-06 DIAGNOSIS — I10 Essential (primary) hypertension: Secondary | ICD-10-CM | POA: Diagnosis not present

## 2020-06-06 DIAGNOSIS — R000 Tachycardia, unspecified: Secondary | ICD-10-CM | POA: Diagnosis not present

## 2020-06-06 DIAGNOSIS — E785 Hyperlipidemia, unspecified: Secondary | ICD-10-CM | POA: Diagnosis not present

## 2020-06-06 DIAGNOSIS — F3341 Major depressive disorder, recurrent, in partial remission: Secondary | ICD-10-CM | POA: Diagnosis not present

## 2020-06-06 DIAGNOSIS — M159 Polyosteoarthritis, unspecified: Secondary | ICD-10-CM | POA: Diagnosis not present

## 2020-06-06 DIAGNOSIS — E291 Testicular hypofunction: Secondary | ICD-10-CM | POA: Diagnosis not present

## 2020-06-11 DIAGNOSIS — I10 Essential (primary) hypertension: Secondary | ICD-10-CM | POA: Diagnosis not present

## 2020-06-11 DIAGNOSIS — M159 Polyosteoarthritis, unspecified: Secondary | ICD-10-CM | POA: Diagnosis not present

## 2020-06-11 DIAGNOSIS — E291 Testicular hypofunction: Secondary | ICD-10-CM | POA: Diagnosis not present

## 2020-06-11 DIAGNOSIS — S301XXA Contusion of abdominal wall, initial encounter: Secondary | ICD-10-CM | POA: Diagnosis not present

## 2020-06-11 DIAGNOSIS — R000 Tachycardia, unspecified: Secondary | ICD-10-CM | POA: Diagnosis not present

## 2020-06-11 DIAGNOSIS — M25512 Pain in left shoulder: Secondary | ICD-10-CM | POA: Diagnosis not present

## 2020-06-11 DIAGNOSIS — L0291 Cutaneous abscess, unspecified: Secondary | ICD-10-CM | POA: Diagnosis not present

## 2020-06-11 DIAGNOSIS — N400 Enlarged prostate without lower urinary tract symptoms: Secondary | ICD-10-CM | POA: Diagnosis not present

## 2020-06-11 DIAGNOSIS — E785 Hyperlipidemia, unspecified: Secondary | ICD-10-CM | POA: Diagnosis not present

## 2020-06-16 DIAGNOSIS — S301XXA Contusion of abdominal wall, initial encounter: Secondary | ICD-10-CM | POA: Diagnosis not present

## 2020-06-16 DIAGNOSIS — M159 Polyosteoarthritis, unspecified: Secondary | ICD-10-CM | POA: Diagnosis not present

## 2020-06-16 DIAGNOSIS — I10 Essential (primary) hypertension: Secondary | ICD-10-CM | POA: Diagnosis not present

## 2020-06-16 DIAGNOSIS — M25512 Pain in left shoulder: Secondary | ICD-10-CM | POA: Diagnosis not present

## 2020-06-16 DIAGNOSIS — E291 Testicular hypofunction: Secondary | ICD-10-CM | POA: Diagnosis not present

## 2020-06-16 DIAGNOSIS — N400 Enlarged prostate without lower urinary tract symptoms: Secondary | ICD-10-CM | POA: Diagnosis not present

## 2020-06-16 DIAGNOSIS — E785 Hyperlipidemia, unspecified: Secondary | ICD-10-CM | POA: Diagnosis not present

## 2020-06-16 DIAGNOSIS — R000 Tachycardia, unspecified: Secondary | ICD-10-CM | POA: Diagnosis not present

## 2020-06-16 DIAGNOSIS — L0291 Cutaneous abscess, unspecified: Secondary | ICD-10-CM | POA: Diagnosis not present

## 2020-06-20 DIAGNOSIS — M25512 Pain in left shoulder: Secondary | ICD-10-CM | POA: Diagnosis not present

## 2020-06-20 DIAGNOSIS — F3341 Major depressive disorder, recurrent, in partial remission: Secondary | ICD-10-CM | POA: Diagnosis not present

## 2020-06-20 DIAGNOSIS — R000 Tachycardia, unspecified: Secondary | ICD-10-CM | POA: Diagnosis not present

## 2020-06-20 DIAGNOSIS — E785 Hyperlipidemia, unspecified: Secondary | ICD-10-CM | POA: Diagnosis not present

## 2020-06-20 DIAGNOSIS — E559 Vitamin D deficiency, unspecified: Secondary | ICD-10-CM | POA: Diagnosis not present

## 2020-06-20 DIAGNOSIS — N400 Enlarged prostate without lower urinary tract symptoms: Secondary | ICD-10-CM | POA: Diagnosis not present

## 2020-06-20 DIAGNOSIS — T148XXA Other injury of unspecified body region, initial encounter: Secondary | ICD-10-CM | POA: Diagnosis not present

## 2020-06-20 DIAGNOSIS — E291 Testicular hypofunction: Secondary | ICD-10-CM | POA: Diagnosis not present

## 2020-06-20 DIAGNOSIS — K219 Gastro-esophageal reflux disease without esophagitis: Secondary | ICD-10-CM | POA: Diagnosis not present

## 2020-06-24 ENCOUNTER — Other Ambulatory Visit: Payer: Self-pay | Admitting: Internal Medicine

## 2020-06-24 ENCOUNTER — Ambulatory Visit: Payer: Medicare PPO | Attending: Internal Medicine

## 2020-06-24 ENCOUNTER — Ambulatory Visit: Payer: Medicare PPO | Admitting: Neurology

## 2020-06-24 DIAGNOSIS — Z23 Encounter for immunization: Secondary | ICD-10-CM

## 2020-06-24 NOTE — Progress Notes (Signed)
   Cov[facility] Vaccination [facility]  Name:  Antonio Oliver    MRN: [id number] DOB: February 07, 1956  06/24/2020  Antonio Oliver was observed post Covid-19 immunization for 15 minutes without incident. He was provided with Vaccine Information Sheet and instruction to access the V-Safe system.   Antonio Oliver was instructed to call 911 with any severe reactions post vaccine: Marland Kitchen Difficulty breathing  . Swelling of face and throat  . A fast heartbeat  . A bad rash all over body  . Dizziness and weakness

## 2020-06-26 DIAGNOSIS — T148XXA Other injury of unspecified body region, initial encounter: Secondary | ICD-10-CM | POA: Diagnosis not present

## 2020-06-26 DIAGNOSIS — N400 Enlarged prostate without lower urinary tract symptoms: Secondary | ICD-10-CM | POA: Diagnosis not present

## 2020-06-26 DIAGNOSIS — M159 Polyosteoarthritis, unspecified: Secondary | ICD-10-CM | POA: Diagnosis not present

## 2020-06-26 DIAGNOSIS — R000 Tachycardia, unspecified: Secondary | ICD-10-CM | POA: Diagnosis not present

## 2020-06-26 DIAGNOSIS — M25512 Pain in left shoulder: Secondary | ICD-10-CM | POA: Diagnosis not present

## 2020-06-26 DIAGNOSIS — E785 Hyperlipidemia, unspecified: Secondary | ICD-10-CM | POA: Diagnosis not present

## 2020-06-26 DIAGNOSIS — I10 Essential (primary) hypertension: Secondary | ICD-10-CM | POA: Diagnosis not present

## 2020-06-26 DIAGNOSIS — F3341 Major depressive disorder, recurrent, in partial remission: Secondary | ICD-10-CM | POA: Diagnosis not present

## 2020-06-26 DIAGNOSIS — E291 Testicular hypofunction: Secondary | ICD-10-CM | POA: Diagnosis not present

## 2020-06-28 DIAGNOSIS — S71101D Unspecified open wound, right thigh, subsequent encounter: Secondary | ICD-10-CM | POA: Diagnosis not present

## 2020-07-03 DIAGNOSIS — E291 Testicular hypofunction: Secondary | ICD-10-CM | POA: Diagnosis not present

## 2020-07-03 DIAGNOSIS — M25512 Pain in left shoulder: Secondary | ICD-10-CM | POA: Diagnosis not present

## 2020-07-03 DIAGNOSIS — R000 Tachycardia, unspecified: Secondary | ICD-10-CM | POA: Diagnosis not present

## 2020-07-03 DIAGNOSIS — E785 Hyperlipidemia, unspecified: Secondary | ICD-10-CM | POA: Diagnosis not present

## 2020-07-03 DIAGNOSIS — I10 Essential (primary) hypertension: Secondary | ICD-10-CM | POA: Diagnosis not present

## 2020-07-03 DIAGNOSIS — K219 Gastro-esophageal reflux disease without esophagitis: Secondary | ICD-10-CM | POA: Diagnosis not present

## 2020-07-03 DIAGNOSIS — F3341 Major depressive disorder, recurrent, in partial remission: Secondary | ICD-10-CM | POA: Diagnosis not present

## 2020-07-03 DIAGNOSIS — N400 Enlarged prostate without lower urinary tract symptoms: Secondary | ICD-10-CM | POA: Diagnosis not present

## 2020-07-03 DIAGNOSIS — E559 Vitamin D deficiency, unspecified: Secondary | ICD-10-CM | POA: Diagnosis not present

## 2020-07-10 DIAGNOSIS — T148XXA Other injury of unspecified body region, initial encounter: Secondary | ICD-10-CM | POA: Diagnosis not present

## 2020-07-10 DIAGNOSIS — I10 Essential (primary) hypertension: Secondary | ICD-10-CM | POA: Diagnosis not present

## 2020-07-10 DIAGNOSIS — E291 Testicular hypofunction: Secondary | ICD-10-CM | POA: Diagnosis not present

## 2020-07-10 DIAGNOSIS — R000 Tachycardia, unspecified: Secondary | ICD-10-CM | POA: Diagnosis not present

## 2020-07-10 DIAGNOSIS — E559 Vitamin D deficiency, unspecified: Secondary | ICD-10-CM | POA: Diagnosis not present

## 2020-07-10 DIAGNOSIS — N400 Enlarged prostate without lower urinary tract symptoms: Secondary | ICD-10-CM | POA: Diagnosis not present

## 2020-07-10 DIAGNOSIS — M25512 Pain in left shoulder: Secondary | ICD-10-CM | POA: Diagnosis not present

## 2020-07-10 DIAGNOSIS — E785 Hyperlipidemia, unspecified: Secondary | ICD-10-CM | POA: Diagnosis not present

## 2020-07-10 DIAGNOSIS — F3341 Major depressive disorder, recurrent, in partial remission: Secondary | ICD-10-CM | POA: Diagnosis not present

## 2020-07-17 DIAGNOSIS — I10 Essential (primary) hypertension: Secondary | ICD-10-CM | POA: Diagnosis not present

## 2020-07-17 DIAGNOSIS — R000 Tachycardia, unspecified: Secondary | ICD-10-CM | POA: Diagnosis not present

## 2020-07-17 DIAGNOSIS — E291 Testicular hypofunction: Secondary | ICD-10-CM | POA: Diagnosis not present

## 2020-07-17 DIAGNOSIS — E039 Hypothyroidism, unspecified: Secondary | ICD-10-CM | POA: Diagnosis not present

## 2020-07-17 DIAGNOSIS — M659 Synovitis and tenosynovitis, unspecified: Secondary | ICD-10-CM | POA: Diagnosis not present

## 2020-07-17 DIAGNOSIS — E118 Type 2 diabetes mellitus with unspecified complications: Secondary | ICD-10-CM | POA: Diagnosis not present

## 2020-07-17 DIAGNOSIS — T148XXA Other injury of unspecified body region, initial encounter: Secondary | ICD-10-CM | POA: Diagnosis not present

## 2020-07-17 DIAGNOSIS — E785 Hyperlipidemia, unspecified: Secondary | ICD-10-CM | POA: Diagnosis not present

## 2020-07-17 DIAGNOSIS — F3341 Major depressive disorder, recurrent, in partial remission: Secondary | ICD-10-CM | POA: Diagnosis not present

## 2020-07-17 DIAGNOSIS — N400 Enlarged prostate without lower urinary tract symptoms: Secondary | ICD-10-CM | POA: Diagnosis not present

## 2020-07-17 DIAGNOSIS — N3281 Overactive bladder: Secondary | ICD-10-CM | POA: Diagnosis not present

## 2020-07-23 DIAGNOSIS — N400 Enlarged prostate without lower urinary tract symptoms: Secondary | ICD-10-CM | POA: Diagnosis not present

## 2020-07-23 DIAGNOSIS — M25512 Pain in left shoulder: Secondary | ICD-10-CM | POA: Diagnosis not present

## 2020-07-23 DIAGNOSIS — N3281 Overactive bladder: Secondary | ICD-10-CM | POA: Diagnosis not present

## 2020-07-23 DIAGNOSIS — R000 Tachycardia, unspecified: Secondary | ICD-10-CM | POA: Diagnosis not present

## 2020-07-23 DIAGNOSIS — F3341 Major depressive disorder, recurrent, in partial remission: Secondary | ICD-10-CM | POA: Diagnosis not present

## 2020-07-23 DIAGNOSIS — E785 Hyperlipidemia, unspecified: Secondary | ICD-10-CM | POA: Diagnosis not present

## 2020-07-23 DIAGNOSIS — T148XXA Other injury of unspecified body region, initial encounter: Secondary | ICD-10-CM | POA: Diagnosis not present

## 2020-07-23 DIAGNOSIS — E291 Testicular hypofunction: Secondary | ICD-10-CM | POA: Diagnosis not present

## 2020-07-23 DIAGNOSIS — I10 Essential (primary) hypertension: Secondary | ICD-10-CM | POA: Diagnosis not present

## 2020-07-28 NOTE — Progress Notes (Signed)
Virtual Visit via Video Note The purpose of this virtual visit is to provide medical care while limiting exposure to the novel coronavirus.    Consent was obtained for video visit:  Yes.   Answered questions that patient had about telehealth interaction:  Yes.   I discussed the limitations, risks, security and privacy concerns of performing an evaluation and management service by telemedicine. I also discussed with the patient that there may be a patient responsible charge related to this service. The patient expressed understanding and agreed to proceed.  Pt location: Home Physician Location: office Name of referring provider:  Cher Nakai, MD I connected with Antonio Oliver at patients initiation/request on 07/30/2020 at  8:50 AM EST by video enabled telemedicine application and verified that I am speaking with the correct person using two identifiers. Pt MRN:  [id number] Pt DOB:  Mar 16, 1956 Video Participants:  Antonio Oliver;    History of Present Illness:  Antonio Oliver is a 64 year old male with complete heart block s/p PPM,possiblerheumatoid arthritis, diabetes, depression/anxiety, chronic back pain and history of cervical myelopathy follows up for migraines  UPDATE: Changed to [facility] in June.  Intensity:moderate Duration:30 minutes to 3 hours Frequency:2 migraines in past 5 months..   Rescue therapy: Roselyn Meier Current NSAIDS:ASA 81mg  Current analgesics:tramadol (daily, for generalized pain); oxycodone (for general pain) Current triptans:none Current ergotamine:none Current anti-emetic:none Current muscle relaxants:tizanidine Current anti-anxiolytic:BuSpar; clonazepam Current sleep aide:none Current Antihypertensive medications:Toprol-XL,Lasix Current Antidepressant medications:Cymbalta 60mg  twice daily, Wellbutrin SR 200mg  twice daily, Remeron 60mg  Current Anticonvulsant medications:gabapentin 600mg  TID Current anti-CGRP:Emgality;  Ubrelvy 100mg  Current Vitamins/Herbal/Supplements:Iron, folic acid, MVI Current Antihistamines/Decongestants:Sudafed Other therapy:none Other medications:Mirapex  Caffeine:1 cup coffee daily. Diet:Does not drink water or soda. Exercise:no Depression:yes; Anxiety:Yes. Major depressive disorder. Needed to retire in 2002.  Other pain:Arthralgias, chronic neck pain, Charcot foot Sleep hygiene:poor  HISTORY: Onset: 1980s Location:Usually bifrontal Quality:Squeezing, throbbing, pounding InitialIntensity:7-8/10.Hedenies new headache, thunderclap headache or severe headache that wakes from sleep. Aura:none Premonitory Phase:none Postdrome:none Associated symptoms:Photophobia, phonophobia, blurred vision.Hedenies associated nausea, vomiting,unilateral numbness or weakness. InitialDuration:Usually a day. Has happened up to 3 days. InitialFrequency:10 to 15 days a month, started Botox in 2018 and improved once a month. InitialFrequency of abortive medication:1 a month Triggers: Unknown Relieving factors:none Activity:aggravates  CT head 11/14/18 personally reviewed and was normal. CT cervical spine 11/06/18 personally reviewed and demonstrated multilevel severe facet arthropathy with moderate bilateral neural foraminal stenosis at C3-4 and C4-5 as well as prior interbody fusion at C6-7 without stenosis.  Past NSAIDS:ibuprofen Past analgesics:Tylenol, Excedrin, Fioricet, Midrin Past abortive triptans:Zomig 5mg ,Sumatriptan 6mg  [place] Past abortive ergotamine:none Past muscle relaxants:Zanaflex Past anti-emetic:none Past antihypertensive medications:Verapamil, propranolol, clonidine Past antidepressant medications:amitriptyline Past anticonvulsant medications:topiramate 200mg  Past anti-CGRP:Aimovig 140mg  Past vitamins/Herbal/Supplements:none Past antihistamines/decongestants:none Other past  therapies:Botox (effective but did not like getting all those injections every 3 months)  Family history of headache:2 brothers had migraines.  Past Medical History: Past Medical History:  Diagnosis Date  . Anxiety 10/30/2018  . Ascending aorta dilation ([place]) 03/06/2019  . Bicuspid aortic valve 12/22/2016  . BPH (benign prostatic hyperplasia)   . Bradycardia 02/01/2019  . Cervical myelopathy ([place]) 11/14/2018  . Chronic back pain 04/03/2013  . Chronic bilateral low back pain 11/17/2016  . Chronic inflammatory arthritis 11/17/2016   History of positive rheumatoid factor in the past. Denies placement on immunosuppressive therapy  . Compartment syndrome ([place]) 06/20/2013   Last Assessment & Plan:  Denies any problems with right leg since April/May Does have swelling at times if he  is on this leg for long periods of time. Wears compression socks regularly and has helped with swelling.  . Complete heart block ([place]) 05/04/2017  . Depression   . Diabetes ([place]) 10/30/2018  . Diabetes mellitus   . Diabetes mellitus due to underlying condition with unspecified complications ([place]) 1/61/0960  . Diabetes mellitus type 2 in nonobese (HCC)   . DM2 (diabetes mellitus, type 2) ([place]) 04/03/2013   Last Assessment & Plan:  Formatting of this note might be different from the original. Diabetes is unchanged.  Continue current treatment regimen. Regular aerobic exercise. Diabetes will be reassessed in 3 months. Will check A1c today. Denies any problems with feet or sensation.  . Enthesopathy of ankle and tarsus 04/04/2013   Last Assessment & Plan:  Continues with swelling after prolonged standing.  Still  Wearing support hose, which helps.  . Essential hypertension 10/30/2018  . Fatigue 10/30/2018  . FHx: migraine headaches 04/04/2013  . Fracture of capitate bone of wrist 02/26/2019  . GERD (gastroesophageal reflux disease) 10/30/2018  . Glaucoma   . Glaucoma 04/03/2013  . Heart murmur 11/17/2016  . Hemangioma of  skin and subcutaneous tissue 07/07/2017  . History of kidney stones 11/17/2016  . Hyperlipidemia 10/30/2018  . Hypertension   . Hypothyroidism 10/30/2018  . Lentigo 07/07/2017  . Lesion of plantar nerve 04/04/2013  . Leukocytosis   . Migraine 11/17/2016  . Mixed dyslipidemia 10/30/2018  . Morbid obesity ([place]) 10/27/2011  . Multiple actinic keratoses 07/07/2017  . Multiple benign melanocytic nevi 07/07/2017  . Neck pain 04/04/2013  . Neoplasm of uncertain behavior of skin 07/28/2018  . Nondisplaced fracture of medial cuneiform of left foot, initial encounter for closed fracture 11/16/2018  . Nonrheumatic aortic valve stenosis 02/01/2019  . Osteoarthritis 10/30/2018  . Other seborrheic keratosis 07/07/2017  . Personal history of tobacco use, presenting hazards to health 04/04/2013  . Presence of permanent cardiac pacemaker   . S/P AVR (aortic valve replacement) 03/10/2017  . Severe single current episode of major depressive disorder, without psychotic features ([place]) 11/17/2016  . Status post bariatric surgery 04/04/2013  . Strain of rotator cuff 04/04/2013  . Surgery, elective   . Syncope 02/14/2019  . Vitamin D deficiency 10/30/2018  . Weakness     Medications: Outpatient Encounter Medications as of 07/30/2020  Medication Sig Note  . aspirin EC 81 MG tablet Take 81 mg by mouth in the morning and at bedtime.   Marland Kitchen atorvastatin (LIPITOR) 20 MG tablet Take 20 mg by mouth daily at 6 PM.    . Brexpiprazole (REXULTI) 2 MG TABS Take 2 mg by mouth daily.    Marland Kitchen buPROPion (WELLBUTRIN SR) 200 MG 12 hr tablet Take 200 mg by mouth 2 (two) times daily.   . busPIRone (BUSPAR) 10 MG tablet Take 20 mg by mouth 2 (two) times daily.    . calcium citrate (CALCITRATE - DOSED IN MG ELEMENTAL CALCIUM) 950 MG tablet Take 200 mg of elemental calcium by mouth daily. 04/05/2019: Not to substitute for carbonate  . calcium-vitamin D (CAL-CITRATE PLUS VITAMIN D) 250-100 MG-UNIT tablet Take 1 tablet by mouth daily.   . Cholecalciferol  125 MCG (5000 UT) TABS Take 125 mcg by mouth daily.   . clonazePAM (KLONOPIN) 0.5 MG tablet Take 0.5 mg by mouth 2 (two) times daily as needed for anxiety.   . clonazePAM (KLONOPIN) 1 MG tablet Take 1 mg by mouth at bedtime. PRN   . Docusate Sodium (DSS) 100 MG CAPS Take 100 mg  by mouth in the morning and at bedtime. Hold for diarrhea   . DULoxetine (CYMBALTA) 60 MG capsule Take 60 mg by mouth 2 (two) times daily.    . fexofenadine (ALLEGRA) 180 MG tablet Take 180 mg by mouth daily.   . finasteride (PROSCAR) 5 MG tablet Take 5 mg by mouth daily.   . fluticasone (FLONASE) 50 MCG/ACT nasal spray Place 2 sprays into the nose daily. PRN   . folic acid (FOLVITE) 1 MG tablet Take 1 mg by mouth daily.   . furosemide (LASIX) 20 MG tablet Take 1 tablet (20 mg total) by mouth as needed (for leg swelling).   . gabapentin (NEURONTIN) 600 MG tablet Take 600-1,200 mg by mouth 3 (three) times daily.   . Galcanezumab-gnlm (EMGALITY) 120 MG/ML SOAJ Inject 120 mg into the skin every 28 (twenty-eight) days.   Marland Kitchen levothyroxine (SYNTHROID) 125 MCG tablet Take 125 mcg by mouth daily.    . Magnesium 500 MG CAPS Take 500 mg by mouth 2 (two) times a day.    . metFORMIN (GLUCOPHAGE) 1000 MG tablet Take 1,000 mg by mouth 2 (two) times daily with a meal. 02/06/2020: Taking once daily  . metoprolol succinate (TOPROL-XL) 25 MG 24 hr tablet Take 12.5 mg by mouth in the morning and at bedtime.   . mirtazapine (REMERON) 30 MG tablet Take 30 mg by mouth at bedtime.    . Multiple Vitamin (MULTIVITAMIN WITH MINERALS) TABS tablet Take 1 tablet by mouth daily.   . Multiple Vitamins-Minerals (MULTIVITAMIN WITH MINERALS) tablet Take 1 tablet by mouth daily.   . multivitamin (VIT W/EXTRA C) CHEW chewable tablet Chew 1 tablet by mouth daily.   Marland Kitchen omeprazole (PRILOSEC) 20 MG capsule Take 20 mg by mouth 2 (two) times daily before a meal.    . oxyCODONE-acetaminophen (PERCOCET) 10-325 MG tablet Take 1 tablet by mouth 3 (three) times daily.     . potassium chloride SA (KLOR-CON) 20 MEQ tablet Take 1 tablet (20 mEq total) by mouth as needed (when taking furosemide).   . pramipexole (MIRAPEX) 1 MG tablet Take 1 mg by mouth at bedtime.    . pseudoephedrine (SUDOGEST) 30 MG tablet TAKE 1 TABLET BY MOUTH EVERY 12 HOURS AS NEEDED FOR CONGESTION.   Marland Kitchen Semaglutide (OZEMPIC, 0.25 OR 0.5 MG/DOSE, [place]) Inject 0.25 mg into the skin once a week.   . silodosin (RAPAFLO) 8 MG CAPS capsule Take 8 mg by mouth daily with breakfast.   . tiZANidine (ZANAFLEX) 4 MG capsule Take 4-8 mg by mouth 3 (three) times daily as needed for muscle spasms.   . traMADol (ULTRAM) 50 MG tablet Take 50 mg by mouth 3 (three) times daily.   Marland Kitchen Ubrogepant (UBRELVY) 100 MG TABS Take 1 tablet by mouth as needed (May repeat dose after 2 hours.  Maximum 2 tablets in 24 hours.).    No facility-administered encounter medications on file as of 07/30/2020.    Allergies: Allergies  Allergen Reactions  . Morphine Itching    Can take with benadry, facial and nose itching  Can take with benadry, facial and nose itching Can take with benadry, facial and nose itching    Family History: Family History  Problem Relation Age of Onset  . Cancer Mother        breast  . Rheum arthritis Mother   . Diabetes Mother   . Heart disease Father   . Kidney disease Father   . Diabetes Father   . Hypertension Brother   .  Diabetes Brother   . Osteoarthritis Brother   . Heart disease Brother   . Hypertension Brother   . Diabetes Brother   . Rheum arthritis Brother   . Depression Daughter   . Anxiety disorder Daughter     Social History: Social History   Socioeconomic History  . Marital status: Married    Spouse name: susan   . Number of children: 1  . Years of education: Not on file  . Highest education level: Bachelor's degree (e.g., BA, AB, BS)  Occupational History  . Not on file  Tobacco Use  . Smoking status: Never Smoker  . Smokeless tobacco: Never Used  Vaping Use   . Vaping Use: Never used  Substance and Sexual Activity  . Alcohol use: Yes    Alcohol/week: 1.0 standard drink    Types: 1 Glasses of wine per week    Comment: occ.  . Drug use: No  . Sexual activity: Not on file  Other Topics Concern  . Not on file  Social History Narrative   Social Review      Patient lives at home with. Spouse [name]   Pt lives in a one or two story home? TWO Story patient lives on first story of home   Does patient lives in a facility? If so where? Private home   What is patient highest level of education? Bachelors degree   Is patient RIGHT or LEFT handed? Right Hand   Does patient drink coffee, tea, soda? Coffee, Tea   How much coffee, tea, soda does patient drink? Coffee in AM, Tea off and on all day   Does patient exercise regularly? No    Social Determinants of Health   Financial Resource Strain:   . Difficulty of Paying Living Expenses: Not on file  Food Insecurity:   . Worried About Charity fundraiser in the Last Year: Not on file  . Ran Out of Food in the Last Year: Not on file  Transportation Needs:   . Lack of Transportation (Medical): Not on file  . Lack of Transportation (Non-Medical): Not on file  Physical Activity:   . Days of Exercise per Week: Not on file  . Minutes of Exercise per Session: Not on file  Stress:   . Feeling of Stress : Not on file  Social Connections:   . Frequency of Communication with Friends and Family: Not on file  . Frequency of Social Gatherings with Friends and Family: Not on file  . Attends Religious Services: Not on file  . Active Member of Clubs or Organizations: Not on file  . Attends Archivist Meetings: Not on file  . Marital Status: Not on file  Intimate Partner Violence:   . Fear of Current or Ex-Partner: Not on file  . Emotionally Abused: Not on file  . Physically Abused: Not on file  . Sexually Abused: Not on file    Observations/Objective:   There were no vitals taken for this  visit. No acute distress.  Alert and oriented.  Speech fluent and not dysarthric.  Language intact.   Assessment and Plan:   Migraine without aura, without status migrainosus, not intractable  1.  Migraine prevention:  Emgality monthly 2.  Migraine rescue:  Ubrelvy 100mg  3.  Limit use of pain relievers to no more than 2 days out of week to prevent risk of rebound or medication-overuse headache. 4.  Keep headache diary 5.  Follow up 6 months.  Follow Up Instructions:    -  I discussed the assessment and treatment plan with the patient. The patient was provided an opportunity to ask questions and all were answered. The patient agreed with the plan and demonstrated an understanding of the instructions.   The patient was advised to call back or seek an in-person evaluation if the symptoms worsen or if the condition fails to improve as anticipated.    Dudley Major, DO

## 2020-07-30 ENCOUNTER — Other Ambulatory Visit: Payer: Self-pay

## 2020-07-30 ENCOUNTER — Encounter: Payer: Self-pay | Admitting: Neurology

## 2020-07-30 ENCOUNTER — Telehealth (INDEPENDENT_AMBULATORY_CARE_PROVIDER_SITE_OTHER): Payer: Medicare PPO | Admitting: Neurology

## 2020-07-30 DIAGNOSIS — G43009 Migraine without aura, not intractable, without status migrainosus: Secondary | ICD-10-CM | POA: Diagnosis not present

## 2020-07-30 DIAGNOSIS — E291 Testicular hypofunction: Secondary | ICD-10-CM | POA: Diagnosis not present

## 2020-07-30 DIAGNOSIS — N400 Enlarged prostate without lower urinary tract symptoms: Secondary | ICD-10-CM | POA: Diagnosis not present

## 2020-07-30 DIAGNOSIS — N3281 Overactive bladder: Secondary | ICD-10-CM | POA: Diagnosis not present

## 2020-07-30 DIAGNOSIS — T148XXA Other injury of unspecified body region, initial encounter: Secondary | ICD-10-CM | POA: Diagnosis not present

## 2020-07-30 DIAGNOSIS — L03115 Cellulitis of right lower limb: Secondary | ICD-10-CM | POA: Diagnosis not present

## 2020-07-30 DIAGNOSIS — I10 Essential (primary) hypertension: Secondary | ICD-10-CM | POA: Diagnosis not present

## 2020-07-30 DIAGNOSIS — E785 Hyperlipidemia, unspecified: Secondary | ICD-10-CM | POA: Diagnosis not present

## 2020-07-30 DIAGNOSIS — F3341 Major depressive disorder, recurrent, in partial remission: Secondary | ICD-10-CM | POA: Diagnosis not present

## 2020-07-30 DIAGNOSIS — R000 Tachycardia, unspecified: Secondary | ICD-10-CM | POA: Diagnosis not present

## 2020-07-30 NOTE — Patient Instructions (Signed)
1.  Emgality 2.  Ubrelvy 3.  Follow up 6 months.

## 2020-08-01 DIAGNOSIS — T148XXA Other injury of unspecified body region, initial encounter: Secondary | ICD-10-CM | POA: Diagnosis not present

## 2020-08-01 DIAGNOSIS — M25512 Pain in left shoulder: Secondary | ICD-10-CM | POA: Diagnosis not present

## 2020-08-01 DIAGNOSIS — N400 Enlarged prostate without lower urinary tract symptoms: Secondary | ICD-10-CM | POA: Diagnosis not present

## 2020-08-01 DIAGNOSIS — I10 Essential (primary) hypertension: Secondary | ICD-10-CM | POA: Diagnosis not present

## 2020-08-01 DIAGNOSIS — F3341 Major depressive disorder, recurrent, in partial remission: Secondary | ICD-10-CM | POA: Diagnosis not present

## 2020-08-01 DIAGNOSIS — R000 Tachycardia, unspecified: Secondary | ICD-10-CM | POA: Diagnosis not present

## 2020-08-01 DIAGNOSIS — E785 Hyperlipidemia, unspecified: Secondary | ICD-10-CM | POA: Diagnosis not present

## 2020-08-01 DIAGNOSIS — E291 Testicular hypofunction: Secondary | ICD-10-CM | POA: Diagnosis not present

## 2020-08-01 DIAGNOSIS — N3281 Overactive bladder: Secondary | ICD-10-CM | POA: Diagnosis not present

## 2020-08-09 DIAGNOSIS — M25512 Pain in left shoulder: Secondary | ICD-10-CM | POA: Diagnosis not present

## 2020-08-09 DIAGNOSIS — T148XXA Other injury of unspecified body region, initial encounter: Secondary | ICD-10-CM | POA: Diagnosis not present

## 2020-08-09 DIAGNOSIS — E785 Hyperlipidemia, unspecified: Secondary | ICD-10-CM | POA: Diagnosis not present

## 2020-08-09 DIAGNOSIS — R000 Tachycardia, unspecified: Secondary | ICD-10-CM | POA: Diagnosis not present

## 2020-08-09 DIAGNOSIS — N400 Enlarged prostate without lower urinary tract symptoms: Secondary | ICD-10-CM | POA: Diagnosis not present

## 2020-08-09 DIAGNOSIS — F3341 Major depressive disorder, recurrent, in partial remission: Secondary | ICD-10-CM | POA: Diagnosis not present

## 2020-08-09 DIAGNOSIS — E291 Testicular hypofunction: Secondary | ICD-10-CM | POA: Diagnosis not present

## 2020-08-09 DIAGNOSIS — I10 Essential (primary) hypertension: Secondary | ICD-10-CM | POA: Diagnosis not present

## 2020-08-09 DIAGNOSIS — N3281 Overactive bladder: Secondary | ICD-10-CM | POA: Diagnosis not present

## 2020-08-22 DIAGNOSIS — E785 Hyperlipidemia, unspecified: Secondary | ICD-10-CM | POA: Diagnosis not present

## 2020-08-22 DIAGNOSIS — E291 Testicular hypofunction: Secondary | ICD-10-CM | POA: Diagnosis not present

## 2020-08-22 DIAGNOSIS — I10 Essential (primary) hypertension: Secondary | ICD-10-CM | POA: Diagnosis not present

## 2020-08-22 DIAGNOSIS — R000 Tachycardia, unspecified: Secondary | ICD-10-CM | POA: Diagnosis not present

## 2020-08-22 DIAGNOSIS — M159 Polyosteoarthritis, unspecified: Secondary | ICD-10-CM | POA: Diagnosis not present

## 2020-08-22 DIAGNOSIS — N400 Enlarged prostate without lower urinary tract symptoms: Secondary | ICD-10-CM | POA: Diagnosis not present

## 2020-08-22 DIAGNOSIS — T148XXA Other injury of unspecified body region, initial encounter: Secondary | ICD-10-CM | POA: Diagnosis not present

## 2020-08-22 DIAGNOSIS — N3281 Overactive bladder: Secondary | ICD-10-CM | POA: Diagnosis not present

## 2020-08-22 DIAGNOSIS — F3341 Major depressive disorder, recurrent, in partial remission: Secondary | ICD-10-CM | POA: Diagnosis not present

## 2020-09-03 ENCOUNTER — Other Ambulatory Visit: Payer: Self-pay | Admitting: Cardiology

## 2020-09-03 NOTE — Telephone Encounter (Signed)
Rx refill sent to pharmacy. 

## 2020-09-16 ENCOUNTER — Encounter: Payer: Self-pay | Admitting: Neurology

## 2020-09-16 NOTE — Progress Notes (Signed)
Lukas Toothman (Key: Corinna LinesRoselyn Meier 100MG  tablets   Form [employer] Electronic [employer] Form Created 6 hours ago Sent to Plan less than a minute ago Plan Response less than a minute ago Submit Clinical Questions Determination N/A Message from [place] already on file for this request.Authorization starting on 09/06/2020 and ending on 09/05/2021.

## 2020-09-17 ENCOUNTER — Other Ambulatory Visit: Payer: Self-pay | Admitting: Neurology

## 2020-09-19 DIAGNOSIS — N400 Enlarged prostate without lower urinary tract symptoms: Secondary | ICD-10-CM | POA: Diagnosis not present

## 2020-09-19 DIAGNOSIS — N3281 Overactive bladder: Secondary | ICD-10-CM | POA: Diagnosis not present

## 2020-09-19 DIAGNOSIS — E785 Hyperlipidemia, unspecified: Secondary | ICD-10-CM | POA: Diagnosis not present

## 2020-09-19 DIAGNOSIS — R000 Tachycardia, unspecified: Secondary | ICD-10-CM | POA: Diagnosis not present

## 2020-09-19 DIAGNOSIS — I10 Essential (primary) hypertension: Secondary | ICD-10-CM | POA: Diagnosis not present

## 2020-09-19 DIAGNOSIS — M159 Polyosteoarthritis, unspecified: Secondary | ICD-10-CM | POA: Diagnosis not present

## 2020-09-19 DIAGNOSIS — E291 Testicular hypofunction: Secondary | ICD-10-CM | POA: Diagnosis not present

## 2020-09-19 DIAGNOSIS — F3341 Major depressive disorder, recurrent, in partial remission: Secondary | ICD-10-CM | POA: Diagnosis not present

## 2020-09-19 DIAGNOSIS — T148XXA Other injury of unspecified body region, initial encounter: Secondary | ICD-10-CM | POA: Diagnosis not present

## 2020-09-24 DIAGNOSIS — Z139 Encounter for screening, unspecified: Secondary | ICD-10-CM | POA: Diagnosis not present

## 2020-09-24 DIAGNOSIS — Z9181 History of falling: Secondary | ICD-10-CM | POA: Diagnosis not present

## 2020-09-24 DIAGNOSIS — E785 Hyperlipidemia, unspecified: Secondary | ICD-10-CM | POA: Diagnosis not present

## 2020-09-24 DIAGNOSIS — Z1331 Encounter for screening for depression: Secondary | ICD-10-CM | POA: Diagnosis not present

## 2020-09-24 DIAGNOSIS — Z0000 Encounter for general adult medical examination without abnormal findings: Secondary | ICD-10-CM | POA: Diagnosis not present

## 2020-10-02 DIAGNOSIS — E1121 Type 2 diabetes mellitus with diabetic nephropathy: Secondary | ICD-10-CM | POA: Diagnosis not present

## 2020-10-02 DIAGNOSIS — Z20822 Contact with and (suspected) exposure to covid-19: Secondary | ICD-10-CM | POA: Diagnosis not present

## 2020-10-02 DIAGNOSIS — Z6827 Body mass index (BMI) 27.0-27.9, adult: Secondary | ICD-10-CM | POA: Diagnosis not present

## 2020-10-02 DIAGNOSIS — R6889 Other general symptoms and signs: Secondary | ICD-10-CM | POA: Diagnosis not present

## 2020-10-02 DIAGNOSIS — Z794 Long term (current) use of insulin: Secondary | ICD-10-CM | POA: Diagnosis not present

## 2020-10-17 DIAGNOSIS — R000 Tachycardia, unspecified: Secondary | ICD-10-CM | POA: Diagnosis not present

## 2020-10-17 DIAGNOSIS — T148XXA Other injury of unspecified body region, initial encounter: Secondary | ICD-10-CM | POA: Diagnosis not present

## 2020-10-17 DIAGNOSIS — N3281 Overactive bladder: Secondary | ICD-10-CM | POA: Diagnosis not present

## 2020-10-17 DIAGNOSIS — N400 Enlarged prostate without lower urinary tract symptoms: Secondary | ICD-10-CM | POA: Diagnosis not present

## 2020-10-17 DIAGNOSIS — E1121 Type 2 diabetes mellitus with diabetic nephropathy: Secondary | ICD-10-CM | POA: Diagnosis not present

## 2020-10-17 DIAGNOSIS — I10 Essential (primary) hypertension: Secondary | ICD-10-CM | POA: Diagnosis not present

## 2020-10-17 DIAGNOSIS — E785 Hyperlipidemia, unspecified: Secondary | ICD-10-CM | POA: Diagnosis not present

## 2020-10-17 DIAGNOSIS — M159 Polyosteoarthritis, unspecified: Secondary | ICD-10-CM | POA: Diagnosis not present

## 2020-10-17 DIAGNOSIS — E291 Testicular hypofunction: Secondary | ICD-10-CM | POA: Diagnosis not present

## 2020-10-17 DIAGNOSIS — F3341 Major depressive disorder, recurrent, in partial remission: Secondary | ICD-10-CM | POA: Diagnosis not present

## 2020-11-12 DIAGNOSIS — Z794 Long term (current) use of insulin: Secondary | ICD-10-CM | POA: Diagnosis not present

## 2020-11-12 DIAGNOSIS — I10 Essential (primary) hypertension: Secondary | ICD-10-CM | POA: Diagnosis not present

## 2020-11-12 DIAGNOSIS — E291 Testicular hypofunction: Secondary | ICD-10-CM | POA: Diagnosis not present

## 2020-11-12 DIAGNOSIS — R000 Tachycardia, unspecified: Secondary | ICD-10-CM | POA: Diagnosis not present

## 2020-11-12 DIAGNOSIS — E785 Hyperlipidemia, unspecified: Secondary | ICD-10-CM | POA: Diagnosis not present

## 2020-11-12 DIAGNOSIS — N400 Enlarged prostate without lower urinary tract symptoms: Secondary | ICD-10-CM | POA: Diagnosis not present

## 2020-11-12 DIAGNOSIS — E1121 Type 2 diabetes mellitus with diabetic nephropathy: Secondary | ICD-10-CM | POA: Diagnosis not present

## 2020-11-12 DIAGNOSIS — M159 Polyosteoarthritis, unspecified: Secondary | ICD-10-CM | POA: Diagnosis not present

## 2020-11-12 DIAGNOSIS — N3281 Overactive bladder: Secondary | ICD-10-CM | POA: Diagnosis not present

## 2020-11-25 DIAGNOSIS — Z7984 Long term (current) use of oral hypoglycemic drugs: Secondary | ICD-10-CM | POA: Diagnosis not present

## 2020-11-25 DIAGNOSIS — E119 Type 2 diabetes mellitus without complications: Secondary | ICD-10-CM | POA: Diagnosis not present

## 2020-11-25 DIAGNOSIS — H2513 Age-related nuclear cataract, bilateral: Secondary | ICD-10-CM | POA: Diagnosis not present

## 2020-11-25 DIAGNOSIS — H40013 Open angle with borderline findings, low risk, bilateral: Secondary | ICD-10-CM | POA: Diagnosis not present

## 2020-12-03 DIAGNOSIS — I10 Essential (primary) hypertension: Secondary | ICD-10-CM | POA: Insufficient documentation

## 2020-12-03 DIAGNOSIS — F32A Depression, unspecified: Secondary | ICD-10-CM | POA: Insufficient documentation

## 2020-12-03 DIAGNOSIS — E1159 Type 2 diabetes mellitus with other circulatory complications: Secondary | ICD-10-CM | POA: Insufficient documentation

## 2020-12-03 DIAGNOSIS — N400 Enlarged prostate without lower urinary tract symptoms: Secondary | ICD-10-CM | POA: Insufficient documentation

## 2020-12-05 ENCOUNTER — Encounter: Payer: Self-pay | Admitting: Cardiology

## 2020-12-05 ENCOUNTER — Ambulatory Visit (INDEPENDENT_AMBULATORY_CARE_PROVIDER_SITE_OTHER): Payer: Medicare PPO | Admitting: Cardiology

## 2020-12-05 ENCOUNTER — Other Ambulatory Visit: Payer: Self-pay

## 2020-12-05 VITALS — BP 118/80 | HR 62 | Ht 75.0 in | Wt 198.4 lb

## 2020-12-05 DIAGNOSIS — Z950 Presence of cardiac pacemaker: Secondary | ICD-10-CM

## 2020-12-05 DIAGNOSIS — E782 Mixed hyperlipidemia: Secondary | ICD-10-CM

## 2020-12-05 DIAGNOSIS — I10 Essential (primary) hypertension: Secondary | ICD-10-CM

## 2020-12-05 DIAGNOSIS — Z952 Presence of prosthetic heart valve: Secondary | ICD-10-CM | POA: Diagnosis not present

## 2020-12-05 DIAGNOSIS — I77810 Thoracic aortic ectasia: Secondary | ICD-10-CM | POA: Diagnosis not present

## 2020-12-05 HISTORY — PX: TRANSTHORACIC ECHOCARDIOGRAM: SHX275

## 2020-12-05 MED ORDER — LISINOPRIL 5 MG PO TABS: ORAL_TABLET | ORAL | 3 refills | Status: DC

## 2020-12-05 NOTE — Progress Notes (Signed)
Cardiology Office Note:    Date:  12/05/2020   ID:  Antonio Oliver, DOB 1955/10/19, MRN [id number]  PCP:  Cher Nakai, MD  Cardiologist:  Jenean Lindau, MD   Referring MD: Cher Nakai, MD    ASSESSMENT:    1. Essential hypertension   2. Ascending aorta dilation (HCC)   3. Mixed dyslipidemia   4. S/P AVR (aortic valve replacement)   5. Presence of permanent cardiac pacemaker    PLAN:    In order of problems listed above:  1. Primary prevention stressed with patient.  Importance of compliance with diet medication stressed any vocalized understanding. 2. Essential hypertension: His blood pressure was elevated and primary care initiated him on blood pressure medications and they are happy with the results.  Diet, salt intake issues and lifestyle modification urged. 3. Mixed dyslipidemia: Patient on statin therapy and tolerating well.  Lipids were reviewed from [facility] sheet. 4. S/p aortic valve replacement: Echo has been done more than 2 years ago and we will repeat this to assess the valve. 5. Aortic dilatation: Ascending aorta: We will do a CT without contrast to assess this.  It was 4.4 cm in the past. 6. S/p permanent pacemaker: Followed and managed by electrophysiology colleagues. 7. Patient will be seen in follow-up appointment in 6 months or earlier if the patient has any concerns    Medication Adjustments/Labs and Tests Ordered: Current medicines are reviewed at length with the patient today.  Concerns regarding medicines are outlined above.  No orders of the defined types were placed in this encounter.  No orders of the defined types were placed in this encounter.    No chief complaint on file.    History of Present Illness:    Antonio Oliver is a 65 y.o. male.  Patient has past medical history of aortic valve replacement, dilated ascending aorta with aneurysm, essential hypertension diabetes mellitus and dyslipidemia.  He denies any problems at this time and takes  care of activities of daily living.  No chest pain orthopnea or PND.  At the time of my evaluation, the patient is alert awake oriented and in no distress.  He leads a sedentary lifestyle overall.  Past Medical History:  Diagnosis Date  . Anxiety 10/30/2018  . Ascending aorta dilation ([place]) 03/06/2019  . Benign prostatic hyperplasia without lower urinary tract symptoms 11/17/2016   Last Assessment & Plan:  Stable PSA today.  Formatting of this note might be different from the original. Last Assessment & Plan:  Stable PSA today. Last Assessment & Plan:  Formatting of this note might be different from the original. Stable PSA today.  . Bicuspid aortic valve 12/22/2016  . BPH (benign prostatic hyperplasia)   . Bradycardia 02/01/2019  . Cervical myelopathy ([place]) 11/14/2018  . Charcot's joint of foot 03/05/2019  . Chronic back pain 04/03/2013  . Chronic bilateral low back pain 11/17/2016  . Chronic inflammatory arthritis 11/17/2016   History of positive rheumatoid factor in the past. Denies placement on immunosuppressive therapy  . Clinical depression 04/03/2013   Last Assessment & Plan:  Formatting of this note might be different from the original. Depression is unchanged.  Discussed sleep hygiene. Medication changes per orders. Depression will be reassessed in 3 months Has been having drowsiness since taking clonazepam. Wishes to try Buspar daily. Discussed non-pharmacologic methods to reduce anxiety/depression such as working in [employer] and relaxation  . Compartment syndrome ([place]) 06/20/2013   Last Assessment & Plan:  Denies any  problems with right leg since April/May Does have swelling at times if he is on this leg for long periods of time. Wears compression socks regularly and has helped with swelling.  . Complete heart block ([place]) 05/04/2017  . Depression   . Diabetes ([place]) 10/30/2018  . Diabetes mellitus due to underlying condition with unspecified complications ([place]) 2/35/3614  . Diabetes mellitus  type 2 in nonobese (HCC)   . DM2 (diabetes mellitus, type 2) ([place]) 04/03/2013   Last Assessment & Plan:  Formatting of this note might be different from the original. Diabetes is unchanged.  Continue current treatment regimen. Regular aerobic exercise. Diabetes will be reassessed in 3 months. Will check A1c today. Denies any problems with feet or sensation.  . Enthesopathy of ankle and tarsus 04/04/2013   Last Assessment & Plan:  Continues with swelling after prolonged standing.  Still  Wearing support hose, which helps.  . Essential hypertension 10/30/2018  . Fatigue 10/30/2018  . FHx: migraine headaches 04/04/2013  . Fracture of capitate bone of wrist 02/26/2019  . Fracture of tibia 03/05/2019  . GERD (gastroesophageal reflux disease) 10/30/2018  . Glaucoma 04/03/2013  . HA (headache) 04/03/2013   Last Assessment & Plan:  Formatting of this note might be different from the original. Infrequent, however still having them.  Not sure of the triggers.  Using zomig, which helps immediately.  Marland Kitchen Heart murmur 11/17/2016  . Hemangioma of skin and subcutaneous tissue 07/07/2017  . History of bariatric surgery 04/04/2013  . History of kidney stones 11/17/2016  . Hyperlipidemia 10/30/2018  . Hypertension   . Hypothyroidism 10/30/2018  . Kidney stone 02/01/2019   Last Assessment & Plan:  Renal condition is unchanged. Continue current medications. Renal condition will be reassessed in 3 months. Discussed increasing fluids, and possibility of pain if stone moves or causes obstruction. Told to call urologist or make appointment if problem worsens.  Last Assessment & Plan:  Formatting of this note might be different from the original. Renal condition is unchan  . Lentigo 07/07/2017  . Lesion of plantar nerve 04/04/2013  . Leukocytosis   . Migraine 11/17/2016  . Mixed dyslipidemia 10/30/2018  . Morbid obesity ([place]) 10/27/2011  . Multiple actinic keratoses 07/07/2017  . Multiple benign melanocytic nevi 07/07/2017  . Neck pain  04/04/2013  . Neoplasm of uncertain behavior of skin 07/28/2018  . Nondisplaced fracture of medial cuneiform of left foot, initial encounter for closed fracture 11/16/2018  . Nonrheumatic aortic valve stenosis 02/01/2019  . Osteoarthritis 10/30/2018  . Other seborrheic keratosis 07/07/2017  . Personal history of tobacco use, presenting hazards to health 04/04/2013  . Presence of permanent cardiac pacemaker   . S/P AVR (aortic valve replacement) 03/10/2017  . Severe single current episode of major depressive disorder, without psychotic features ([place]) 11/17/2016  . Status post bariatric surgery 04/04/2013  . Strain of rotator cuff 04/04/2013  . Surgery, elective   . Syncope 02/14/2019  . Vitamin D deficiency 10/30/2018  . Weakness     Past Surgical History:  Procedure Laterality Date  . ANTERIOR CERVICAL DECOMP/DISCECTOMY FUSION N/A 11/17/2018   Procedure: CERVICAL THREE-CERVICAL FOUR, CERVICAL FOUR-CERVICAL FIVE, CERVICAL FIVE-CERVICAL SIX ANTERIOR CERVICAL DECOMPRESSION/DISCECTOMY FUSION;  Surgeon: Earnie Larsson, MD;  Location: [place];  Service: Neurosurgery;  Laterality: N/A;  . APPLICATION OF WOUND VAC Left 04/05/2019   Procedure: Application Of Wound Vac;  Surgeon: Erle Crocker, MD;  Location: [place];  Service: Orthopedics;  Laterality: Left;  . BACK SURGERY  x4  . FASCIOTOMY Left 04/05/2019   Left leg 2 compartment fasciotomy  . FASCIOTOMY Left 04/05/2019   Procedure: FASCIOTOMY LEFT LOWER LEG;  Surgeon: Erle Crocker, MD;  Location: [place];  Service: Orthopedics;  Laterality: Left;  . FOOT NEUROMA SURGERY    . GASTRIC BYPASS    . HEMATOMA EVACUATION Left 04/05/2019   Procedure: Evacuation Hematoma Left Lower Leg;  Surgeon: Erle Crocker, MD;  Location: [place];  Service: Orthopedics;  Laterality: Left;  . HEMORRHOID SURGERY     over 30 years ago  . HERNIA REPAIR     LIH umb  . NASAL SINUS SURGERY     x2  . NECK SURGERY      Current Medications: Current Meds   Medication Sig  . aspirin EC 81 MG tablet Take 81 mg by mouth daily.  Marland Kitchen atorvastatin (LIPITOR) 20 MG tablet Take 20 mg by mouth daily at 6 PM.   . brexpiprazole (REXULTI) 2 MG TABS tablet Take 2 mg by mouth daily.   Marland Kitchen buPROPion (WELLBUTRIN SR) 200 MG 12 hr tablet Take 200 mg by mouth 2 (two) times daily.  . busPIRone (BUSPAR) 10 MG tablet Take 20 mg by mouth 2 (two) times daily.   . calcium citrate (CALCITRATE - DOSED IN MG ELEMENTAL CALCIUM) 950 MG tablet Take 200 mg of elemental calcium by mouth 2 (two) times daily.  . Cholecalciferol 125 MCG (5000 UT) TABS Take 125 mcg by mouth daily.  . clonazePAM (KLONOPIN) 0.5 MG tablet Take 0.5 mg by mouth 2 (two) times daily as needed for anxiety.  . DULoxetine (CYMBALTA) 60 MG capsule Take 60 mg by mouth 2 (two) times daily.   . fexofenadine (ALLEGRA) 180 MG tablet Take 180 mg by mouth daily.  . finasteride (PROSCAR) 5 MG tablet Take 5 mg by mouth daily.  . fluticasone (FLONASE) 50 MCG/ACT nasal spray Place 2 sprays into the nose as needed for allergies or rhinitis.  . folic acid (FOLVITE) 1 MG tablet Take 1 mg by mouth daily.  . furosemide (LASIX) 20 MG tablet Take 1 tablet (20 mg total) by mouth as needed (for leg swelling).  . gabapentin (NEURONTIN) 600 MG tablet Take 600-1,200 mg by mouth 3 (three) times daily.  . Galcanezumab-gnlm (EMGALITY) 120 MG/ML SOAJ Inject 120 mg into the skin every 28 (twenty-eight) days.  Marland Kitchen levothyroxine (SYNTHROID) 125 MCG tablet Take 125 mcg by mouth daily.   . Magnesium 500 MG CAPS Take 500 mg by mouth 2 (two) times a day.   . metFORMIN (GLUCOPHAGE) 1000 MG tablet Take 1,000 mg by mouth 2 (two) times daily with a meal.  . metoprolol succinate (TOPROL-XL) 25 MG 24 hr tablet Take 25 mg by mouth in the morning and at bedtime.  . mirtazapine (REMERON) 30 MG tablet Take 30 mg by mouth at bedtime.   . Multiple Vitamins-Minerals (MULTIVITAMIN WITH MINERALS) tablet Take 1 tablet by mouth daily.  Marland Kitchen omeprazole (PRILOSEC) 20  MG capsule Take 20 mg by mouth 2 (two) times daily before a meal.   . oxyCODONE-acetaminophen (PERCOCET) 10-325 MG tablet Take 1 tablet by mouth 3 (three) times daily.  . potassium chloride SA (KLOR-CON) 20 MEQ tablet Take 20 mEq by mouth as needed. With lasix  . pramipexole (MIRAPEX) 1 MG tablet Take 1 mg by mouth at bedtime.   . Semaglutide (OZEMPIC, 0.25 OR 0.5 MG/DOSE, [place]) Inject 0.5 mg into the skin once a week.  . solifenacin (VESICARE) 10 MG tablet Take  10 mg by mouth daily.  Marland Kitchen testosterone cypionate (DEPO-TESTOSTERONE) 200 MG/ML injection Inject 200 mg into the muscle every 30 (thirty) days.  Marland Kitchen tiZANidine (ZANAFLEX) 4 MG capsule Take 4-8 mg by mouth 3 (three) times daily as needed for muscle spasms.  . traMADol (ULTRAM) 50 MG tablet Take 50 mg by mouth 3 (three) times daily.  Marland Kitchen Ubrogepant 100 MG TABS Take 100 mg by mouth as needed for migraine.     Allergies:   Morphine   Social History   Socioeconomic History  . Marital status: Married    Spouse name: susan   . Number of children: 1  . Years of education: Not on file  . Highest education level: Bachelor's degree (e.g., BA, AB, BS)  Occupational History  . Not on file  Tobacco Use  . Smoking status: Never Smoker  . Smokeless tobacco: Never Used  Vaping Use  . Vaping Use: Never used  Substance and Sexual Activity  . Alcohol use: Yes    Alcohol/week: 1.0 standard drink    Types: 1 Glasses of wine per week    Comment: occ.  . Drug use: No  . Sexual activity: Not on file  Other Topics Concern  . Not on file  Social History Narrative   Social Review      Patient lives at home with. Spouse [name]   Pt lives in a one or two story home? TWO Story patient lives on first story of home   Does patient lives in a facility? If so where? Private home   What is patient highest level of education? Bachelors degree   Is patient RIGHT or LEFT handed? Right Hand   Does patient drink coffee, tea, soda? Coffee, Tea   How much coffee,  tea, soda does patient drink? Coffee in AM, Tea off and on all day   Does patient exercise regularly? No    Social Determinants of Radio broadcast assistant Strain: Not on file  Food Insecurity: Not on file  Transportation Needs: Not on file  Physical Activity: Not on file  Stress: Not on file  Social Connections: Not on file     Family History: The patient's family history includes Anxiety disorder in his daughter; Cancer in his mother; Depression in his daughter; Diabetes in his brother, brother, father, and mother; Heart disease in his brother and father; Hypertension in his brother and brother; Kidney disease in his father; Osteoarthritis in his brother; Rheum arthritis in his brother and mother.  ROS:   Please see the history of present illness.    All other systems reviewed and are negative.  EKGs/Labs/Other Studies Reviewed:    The following studies were reviewed today:  EKG reveals sinus rhythm and nonspecific ST-T changes. I discussed my findings with the patient at length.  I reviewed echocardiogram and CT scan in detail from the past.   Recent Labs: 05/11/2020: ALT 25; BUN 22; Creatinine, Ser 1.24; Hemoglobin 13.6; Platelets 284; Potassium 6.0; Sodium 134  Recent Lipid Panel    Component Value Date/Time   CHOL 161 07/12/2019 0910   TRIG 99 07/12/2019 0910   HDL 62 07/12/2019 0910   CHOLHDL 2.6 07/12/2019 0910   CHOLHDL 2.3 07/28/2011 1630   VLDL 24 07/28/2011 1630   LDLCALC 81 07/12/2019 0910    Physical Exam:    VS:  BP 118/80   Pulse 62   Ht 6\' 3"  (1.905 m)   Wt 198 lb 6.4 oz (90 kg)   SpO2  99%   BMI 24.80 kg/m     Wt Readings from Last 3 Encounters:  12/05/20 198 lb 6.4 oz (90 kg)  02/06/20 200 lb (90.7 kg)  12/28/19 214 lb (97.1 kg)     GEN: Patient is in no acute distress HEENT: Normal NECK: No JVD; No carotid bruits LYMPHATICS: No lymphadenopathy CARDIAC: Hear sounds regular, 2/6 systolic murmur at the apex. RESPIRATORY:  Clear to  auscultation without rales, wheezing or rhonchi  ABDOMEN: Soft, non-tender, non-distended MUSCULOSKELETAL:  No edema; No deformity  SKIN: Warm and dry NEUROLOGIC:  Alert and oriented x 3 PSYCHIATRIC:  Normal affect   Signed, Jenean Lindau, MD  12/05/2020 8:59 AM    [facility]

## 2020-12-05 NOTE — Addendum Note (Signed)
Addended by: Truddie Hidden on: 12/05/2020 09:31 AM   Modules accepted: Orders

## 2020-12-05 NOTE — Patient Instructions (Addendum)
Medication Instructions:  Your physician has recommended you make the following change in your medication:  Take Lisinopril 5 mg daily and as needed for BP greater than 160.  *If you need a refill on your cardiac medications before your next appointment, please call your pharmacy*   Lab Work: None ordered If you have labs (blood work) drawn today and your tests are completely normal, you will receive your results only by: Marland Kitchen MyChart Message (if you have MyChart) OR . A paper copy in the mail If you have any lab test that is abnormal or we need to change your treatment, we will call you to review the results.   Testing/Procedures: Your physician has requested that you have an echocardiogram. Echocardiography is a painless test that uses sound waves to create images of your heart. It provides your doctor with information about the size and shape of your heart and how well your heart's chambers and valves are working. This procedure takes approximately one hour. There are no restrictions for this procedure.   We will get you scheduled for your CT scan at [facility].  Follow-Up: At [facility], you and your health needs are our priority.  As part of our continuing mission to provide you with exceptional heart care, we have created designated Provider Care Teams.  These Care Teams include your primary Cardiologist (physician) and Advanced Practice Providers (APPs -  Physician Assistants and Nurse Practitioners) who all work together to provide you with the care you need, when you need it.  We recommend signing up for the patient portal called "MyChart".  Sign up information is provided on this After Visit Summary.  MyChart is used to connect with patients for Virtual Visits (Telemedicine).  Patients are able to view lab/test results, encounter notes, upcoming appointments, etc.  Non-urgent messages can be sent to your provider as well.   To learn more about what you can do with MyChart,  go to [email or web address].    Your next appointment:   6 month(s)  The format for your next appointment:   In Person  Provider:   Jyl Heinz, MD   Other Instructions  Echocardiogram An echocardiogram is a test that uses sound waves (ultrasound) to produce images of the heart. Images from an echocardiogram can provide important information about:  Heart size and shape.  The size and thickness and movement of your heart's walls.  Heart muscle function and strength.  Heart valve function or if you have stenosis. Stenosis is when the heart valves are too narrow.  If blood is flowing backward through the heart valves (regurgitation).  A tumor or infectious growth around the heart valves.  Areas of heart muscle that are not working well because of poor blood flow or injury from a heart attack.  Aneurysm detection. An aneurysm is a weak or damaged part of an artery wall. The wall bulges out from the normal force of blood pumping through the body. Tell a health care provider about:  Any allergies you have.  All medicines you are taking, including vitamins, herbs, eye drops, creams, and over-the-counter medicines.  Any blood disorders you have.  Any surgeries you have had.  Any medical conditions you have.  Whether you are pregnant or may be pregnant. What are the risks? Generally, this is a safe test. However, problems may occur, including an allergic reaction to dye (contrast) that may be used during the test. What happens before the test? No specific preparation is needed. You may eat  and drink normally. What happens during the test?  You will take off your clothes from the waist up and put on a hospital gown.  Electrodes or electrocardiogram (ECG)patches may be placed on your chest. The electrodes or patches are then connected to a device that monitors your heart rate and rhythm.  You will lie down on a table for an ultrasound exam. A gel will be applied to  your chest to help sound waves pass through your skin.  A handheld device, called a transducer, will be pressed against your chest and moved over your heart. The transducer produces sound waves that travel to your heart and bounce back (or "echo" back) to the transducer. These sound waves will be captured in real-time and changed into images of your heart that can be viewed on a video monitor. The images will be recorded on a computer and reviewed by your health care provider.  You may be asked to change positions or hold your breath for a short time. This makes it easier to get different views or better views of your heart.  In some cases, you may receive contrast through an IV in one of your veins. This can improve the quality of the pictures from your heart. The procedure may vary among health care providers and hospitals.   What can I expect after the test? You may return to your normal, everyday life, including diet, activities, and medicines, unless your health care provider tells you not to do that. Follow these instructions at home:  It is up to you to get the results of your test. Ask your health care provider, or the department that is doing the test, when your results will be ready.  Keep all follow-up visits. This is important. Summary  An echocardiogram is a test that uses sound waves (ultrasound) to produce images of the heart.  Images from an echocardiogram can provide important information about the size and shape of your heart, heart muscle function, heart valve function, and other possible heart problems.  You do not need to do anything to prepare before this test. You may eat and drink normally.  After the echocardiogram is completed, you may return to your normal, everyday life, unless your health care provider tells you not to do that. This information is not intended to replace advice given to you by your health care provider. Make sure you discuss any questions you have  with your health care provider. Document Revised: 04/15/2020 Document Reviewed: 04/15/2020 Elsevier Patient Education  2021 [employer].

## 2020-12-09 ENCOUNTER — Encounter: Payer: Self-pay | Admitting: Cardiology

## 2020-12-09 NOTE — Telephone Encounter (Signed)
error 

## 2020-12-10 DIAGNOSIS — M159 Polyosteoarthritis, unspecified: Secondary | ICD-10-CM | POA: Diagnosis not present

## 2020-12-10 DIAGNOSIS — J984 Other disorders of lung: Secondary | ICD-10-CM | POA: Diagnosis not present

## 2020-12-10 DIAGNOSIS — Z794 Long term (current) use of insulin: Secondary | ICD-10-CM | POA: Diagnosis not present

## 2020-12-10 DIAGNOSIS — I700 Atherosclerosis of aorta: Secondary | ICD-10-CM | POA: Diagnosis not present

## 2020-12-10 DIAGNOSIS — E785 Hyperlipidemia, unspecified: Secondary | ICD-10-CM | POA: Diagnosis not present

## 2020-12-10 DIAGNOSIS — N400 Enlarged prostate without lower urinary tract symptoms: Secondary | ICD-10-CM | POA: Diagnosis not present

## 2020-12-10 DIAGNOSIS — N3281 Overactive bladder: Secondary | ICD-10-CM | POA: Diagnosis not present

## 2020-12-10 DIAGNOSIS — I712 Thoracic aortic aneurysm, without rupture: Secondary | ICD-10-CM | POA: Diagnosis not present

## 2020-12-10 DIAGNOSIS — E1121 Type 2 diabetes mellitus with diabetic nephropathy: Secondary | ICD-10-CM | POA: Diagnosis not present

## 2020-12-10 DIAGNOSIS — I2510 Atherosclerotic heart disease of native coronary artery without angina pectoris: Secondary | ICD-10-CM | POA: Diagnosis not present

## 2020-12-10 DIAGNOSIS — E291 Testicular hypofunction: Secondary | ICD-10-CM | POA: Diagnosis not present

## 2020-12-10 DIAGNOSIS — M659 Synovitis and tenosynovitis, unspecified: Secondary | ICD-10-CM | POA: Diagnosis not present

## 2020-12-10 DIAGNOSIS — I77810 Thoracic aortic ectasia: Secondary | ICD-10-CM | POA: Diagnosis not present

## 2020-12-10 DIAGNOSIS — R918 Other nonspecific abnormal finding of lung field: Secondary | ICD-10-CM | POA: Diagnosis not present

## 2020-12-10 DIAGNOSIS — I10 Essential (primary) hypertension: Secondary | ICD-10-CM | POA: Diagnosis not present

## 2020-12-12 ENCOUNTER — Ambulatory Visit (INDEPENDENT_AMBULATORY_CARE_PROVIDER_SITE_OTHER): Payer: Medicare PPO

## 2020-12-12 ENCOUNTER — Telehealth: Payer: Self-pay

## 2020-12-12 ENCOUNTER — Other Ambulatory Visit: Payer: Self-pay

## 2020-12-12 DIAGNOSIS — I77810 Thoracic aortic ectasia: Secondary | ICD-10-CM

## 2020-12-12 DIAGNOSIS — Z952 Presence of prosthetic heart valve: Secondary | ICD-10-CM

## 2020-12-12 LAB — ECHOCARDIOGRAM COMPLETE
AR max vel: 1.46 cm2
AV Area VTI: 1.67 cm2
AV Area mean vel: 1.69 cm2
AV Mean grad: 8.5 mmHg
AV Peak grad: 16.6 mmHg
Ao pk vel: 2.04 m/s
Area-P 1/2: 3.23 cm2
MV M vel: 6.09 m/s
MV Peak grad: 148.4 mmHg
Radius: 0.6 cm
S' Lateral: 3 cm

## 2020-12-12 NOTE — Progress Notes (Signed)
Complete echocardiogram performed.  Jimmy Doyne Ellinger RDCS, RVT  

## 2020-12-12 NOTE — Telephone Encounter (Signed)
Spoke with Antonio Oliver as per DPR and let her know that the pt's CT at [facility] was stable. Results reviewed as per Dr. Julien Nordmann note.  Antonio Oliver verbalized understanding and had no additional questions.

## 2020-12-23 ENCOUNTER — Other Ambulatory Visit: Payer: Self-pay

## 2020-12-23 MED ORDER — EMGALITY 120 MG/ML ~~LOC~~ SOAJ: 120.0000 mg | SUBCUTANEOUS | 0 refills | Status: DC

## 2020-12-23 NOTE — Progress Notes (Signed)
Fax recevied from [facility] needed for [employer].

## 2020-12-25 ENCOUNTER — Telehealth: Payer: Self-pay

## 2020-12-25 DIAGNOSIS — S82202A Unspecified fracture of shaft of left tibia, initial encounter for closed fracture: Secondary | ICD-10-CM | POA: Diagnosis not present

## 2020-12-25 DIAGNOSIS — J449 Chronic obstructive pulmonary disease, unspecified: Secondary | ICD-10-CM | POA: Diagnosis not present

## 2020-12-25 DIAGNOSIS — I6782 Cerebral ischemia: Secondary | ICD-10-CM | POA: Diagnosis not present

## 2020-12-25 DIAGNOSIS — N200 Calculus of kidney: Secondary | ICD-10-CM | POA: Diagnosis not present

## 2020-12-25 DIAGNOSIS — Z950 Presence of cardiac pacemaker: Secondary | ICD-10-CM | POA: Diagnosis not present

## 2020-12-25 DIAGNOSIS — M5416 Radiculopathy, lumbar region: Secondary | ICD-10-CM | POA: Diagnosis not present

## 2020-12-25 DIAGNOSIS — Z951 Presence of aortocoronary bypass graft: Secondary | ICD-10-CM | POA: Diagnosis not present

## 2020-12-25 DIAGNOSIS — R29898 Other symptoms and signs involving the musculoskeletal system: Secondary | ICD-10-CM | POA: Diagnosis not present

## 2020-12-25 DIAGNOSIS — G588 Other specified mononeuropathies: Secondary | ICD-10-CM | POA: Diagnosis not present

## 2020-12-25 DIAGNOSIS — R200 Anesthesia of skin: Secondary | ICD-10-CM | POA: Diagnosis not present

## 2020-12-25 DIAGNOSIS — I2510 Atherosclerotic heart disease of native coronary artery without angina pectoris: Secondary | ICD-10-CM | POA: Diagnosis not present

## 2020-12-25 DIAGNOSIS — R531 Weakness: Secondary | ICD-10-CM | POA: Diagnosis not present

## 2020-12-25 NOTE — Telephone Encounter (Signed)
Patients wife called she stated her husband was seen in the ed today at [place] patient was supposed to have a mri done there but couldn't due to having a pace maker wife says Dr.Miller spoke to [facility]? She is requesting a referral to be sent to [facility] call [phone]

## 2020-12-26 ENCOUNTER — Telehealth: Payer: Self-pay | Admitting: Specialist

## 2020-12-26 NOTE — Telephone Encounter (Signed)
I called and spoke with Mrs. Glotfelty, they refuse to see Dr. Lorin Mercy due to prior events that took place. She states that Dr. Sabra Heck spoke with someone in our office yesterday (she says that it was Dr. Louanne Skye, however he says that it was not him that they spoke with) and they advised that he needed to get an MRI at [facility] imaging due to having a pacemaker. They are needing an order for the MRI put in.  I advised that he would probably need to be seen to get this ordered, she states "I do not need you to tell me how the process goes, I work in it and I know how it goes!" I advised that [place] doesn't do MRI's on patients with pacemakers either, that he would have to have it done at [facility].   So I don't know if they spoke with Lurena Joiner or Dr. Marlou Sa yesterday since they were on call yesterday.  Can you ask them about this and call her back? She is very frustrated that no one knows who he spoke with at our office.

## 2020-12-26 NOTE — Telephone Encounter (Signed)
Do I need to do anything ?

## 2020-12-26 NOTE — Telephone Encounter (Signed)
Discussed with patient and Dr Lorin Mercy. He wanted patient worked in with Dr Louanne Skye next week. Appt has been scheduled

## 2020-12-26 NOTE — Telephone Encounter (Signed)
Dr. Louanne Skye states that he did not speak to anyone about this patient, however he has seen Dr. Lorin Mercy in the past.  Can you see if Dr. Lorin Mercy may have spoke with Dr. Sabra Heck about this patient?

## 2020-12-26 NOTE — Telephone Encounter (Signed)
Patient Called asking for an update about referral for MRI. Please call patient and wife about this matter at [phone].

## 2020-12-26 NOTE — Telephone Encounter (Signed)
Sending to you as Juluis Rainier

## 2020-12-26 NOTE — Telephone Encounter (Signed)
I have sent previous message to Caryl Pina to ask Dr. Lorin Mercy about it

## 2020-12-27 NOTE — Telephone Encounter (Signed)
OK tp schedule an appointment. No I did not speak with Dr. Sabra Heck but sounds like patient wishes to be seen.

## 2020-12-28 NOTE — Telephone Encounter (Signed)
I offered to see him Friday, wife when called by staff did not want to come in just get MRI. She was not happy mean to staff, did not bring him in to see me.

## 2020-12-29 NOTE — Telephone Encounter (Signed)
Patient scheduled for 12/31/20

## 2020-12-30 DIAGNOSIS — E785 Hyperlipidemia, unspecified: Secondary | ICD-10-CM | POA: Diagnosis not present

## 2020-12-30 DIAGNOSIS — N3281 Overactive bladder: Secondary | ICD-10-CM | POA: Diagnosis not present

## 2020-12-30 DIAGNOSIS — I10 Essential (primary) hypertension: Secondary | ICD-10-CM | POA: Diagnosis not present

## 2020-12-30 DIAGNOSIS — M159 Polyosteoarthritis, unspecified: Secondary | ICD-10-CM | POA: Diagnosis not present

## 2020-12-30 DIAGNOSIS — N400 Enlarged prostate without lower urinary tract symptoms: Secondary | ICD-10-CM | POA: Diagnosis not present

## 2020-12-30 DIAGNOSIS — E1121 Type 2 diabetes mellitus with diabetic nephropathy: Secondary | ICD-10-CM | POA: Diagnosis not present

## 2020-12-30 DIAGNOSIS — E291 Testicular hypofunction: Secondary | ICD-10-CM | POA: Diagnosis not present

## 2020-12-30 DIAGNOSIS — Z794 Long term (current) use of insulin: Secondary | ICD-10-CM | POA: Diagnosis not present

## 2020-12-30 DIAGNOSIS — M659 Synovitis and tenosynovitis, unspecified: Secondary | ICD-10-CM | POA: Diagnosis not present

## 2020-12-31 ENCOUNTER — Telehealth: Payer: Self-pay | Admitting: Cardiology

## 2020-12-31 ENCOUNTER — Ambulatory Visit: Payer: Medicare PPO | Admitting: Specialist

## 2020-12-31 ENCOUNTER — Ambulatory Visit: Payer: Self-pay

## 2020-12-31 ENCOUNTER — Encounter: Payer: Self-pay | Admitting: Specialist

## 2020-12-31 VITALS — BP 131/83 | HR 67 | Ht 75.0 in | Wt 199.0 lb

## 2020-12-31 DIAGNOSIS — M5416 Radiculopathy, lumbar region: Secondary | ICD-10-CM | POA: Diagnosis not present

## 2020-12-31 DIAGNOSIS — M545 Low back pain, unspecified: Secondary | ICD-10-CM

## 2020-12-31 DIAGNOSIS — M4326 Fusion of spine, lumbar region: Secondary | ICD-10-CM | POA: Diagnosis not present

## 2020-12-31 DIAGNOSIS — Z4889 Encounter for other specified surgical aftercare: Secondary | ICD-10-CM | POA: Diagnosis not present

## 2020-12-31 NOTE — Telephone Encounter (Signed)
Antonio Oliver that once we receive a clearance we would be able to see what test he needs. Pt needs MRI before it will be determined. [name] verbalized understanding and had no additional questions.

## 2020-12-31 NOTE — Progress Notes (Signed)
Office Visit Note   Patient: Antonio Oliver           Date of Birth: [birth year]           MRN: [id number] Visit Date: 12/31/2020              Requested by: Cher Nakai, MD [address] [place],  [place] [address] PCP: Cher Nakai, MD   Assessment & Plan: Visit Diagnoses:  1. Low back pain, unspecified back pain laterality, unspecified chronicity, unspecified whether sciatica present   2. Encounter for other specified surgical aftercare   3. Lumbar radiculopathy     Plan: Avoid bending, stooping and avoid lifting weights greater than 10 lbs. Avoid prolong standing and walking. Avoid frequent bending and stooping  No lifting greater than 10 lbs. May use ice or moist heat for pain. Weight loss is of benefit. Handicap license is approved. Lumbar MRI with and without enhancement to evaluate for adjacent level spinal stenosis L1-2 and L2-3. Likely you will need intervention to decompress the nerves that are pinched and causing right leg weakness.   Follow-Up Instructions: Return in about 2 weeks (around 01/14/2021).   Orders:  Orders Placed This Encounter  Procedures  . XR Lumbar Spine 2-3 Views   No orders of the defined types were placed in this encounter.     Procedures: No procedures performed   Clinical Data: No additional findings.   Subjective: Chief Complaint  Patient presents with  . Lower Back - Pain    65 year old male with history of cervical stenosis and lumbar spinal stenosis and DDD. Post lumbar fusion surgery in 2015 L4 to S1 2 level TLIF. He reports chronic left leg weakness but only last week started to Experience pain and numbness and weakness into the right let with numbness into the right knee and anterior right shin. He is weak in standing and walking and was using a cane until Thursday and he has had previous Pacemaker since 2018. Had aortic valve replacment and had changes of the perivalve area and a pacemaker was placed. The last time  the pacer was interrogated it was 99.9% unused. He sees Dr. Barnie Mort in [place] with  [facility].    Review of Systems  Constitutional: Negative.  Negative for activity change, appetite change, chills, diaphoresis, fatigue, fever and unexpected weight change.  HENT: Negative.  Negative for congestion, dental problem, drooling, ear discharge, ear pain, facial swelling, hearing loss, mouth sores, nosebleeds, postnasal drip, rhinorrhea, sinus pressure, sinus pain, sneezing, sore throat, tinnitus, trouble swallowing and voice change.   Eyes: Positive for visual disturbance (some loss of vision over time last 2 years). Negative for photophobia, pain, discharge, redness and itching.  Respiratory: Negative.   Cardiovascular: Negative for chest pain, palpitations and leg swelling.  Gastrointestinal: Negative.  Negative for abdominal distention, abdominal pain, anal bleeding, blood in stool, constipation, diarrhea and nausea.  Endocrine: Negative for cold intolerance, heat intolerance, polydipsia, polyphagia and polyuria.  Genitourinary: Negative.  Negative for difficulty urinating, dysuria, enuresis, flank pain, frequency, genital sores and hematuria.  Musculoskeletal: Positive for back pain and gait problem. Negative for arthralgias, joint swelling, myalgias, neck pain and neck stiffness.  Skin: Negative.  Negative for color change, pallor, rash and wound.  Allergic/Immunologic: Negative.  Negative for environmental allergies, food allergies and immunocompromised state.  Neurological: Positive for weakness and numbness. Negative for dizziness, tremors, seizures, syncope, facial asymmetry, speech difficulty, light-headedness and headaches.  Hematological: Negative for adenopathy. Bruises/bleeds  easily.  Psychiatric/Behavioral: Negative for agitation, behavioral problems, confusion, decreased concentration, dysphoric mood, hallucinations, self-injury, sleep disturbance and suicidal ideas. The patient is not  nervous/anxious and is not hyperactive.      Objective: Vital Signs: BP 131/83 (BP Location: Left Arm, Patient Position: Sitting)   Pulse 67   Ht 6\' 3"  (1.905 m)   Wt 199 lb (90.3 kg)   BMI 24.87 kg/m   Physical Exam Constitutional:      Appearance: He is well-developed.  HENT:     Head: Normocephalic and atraumatic.  Eyes:     Pupils: Pupils are equal, round, and reactive to light.  Pulmonary:     Effort: Pulmonary effort is normal.     Breath sounds: Normal breath sounds.  Abdominal:     General: Bowel sounds are normal.     Palpations: Abdomen is soft.  Musculoskeletal:     Cervical back: Normal range of motion and neck supple.     Lumbar back: Positive right straight leg raise test. Negative left straight leg raise test.  Skin:    General: Skin is warm and dry.  Neurological:     Mental Status: He is alert and oriented to person, place, and time.  Psychiatric:        Behavior: Behavior normal.        Thought Content: Thought content normal.        Judgment: Judgment normal.     Back Exam   Range of Motion  Extension: abnormal  Flexion: abnormal  Lateral bend right: abnormal  Lateral bend left: abnormal  Rotation right: abnormal  Rotation left: abnormal   Muscle Strength  Right Quadriceps:  4/5  Left Quadriceps:  5/5  Right Hamstrings:  5/5  Left Hamstrings:  5/5   Tests  Straight leg raise right: positive Straight leg raise left: negative  Reflexes  Patellar: 0/4 Achilles: 0/4  Other  Toe walk: abnormal Heel walk: abnormal      Specialty Comments:  No specialty comments available.  Imaging: No results found.   PMFS History: Patient Active Problem List   Diagnosis Date Noted  . BPH (benign prostatic hyperplasia)   . Depression   . Hypertension   . Presence of permanent cardiac pacemaker 12/28/2019  . Ascending aorta dilation ([place]) 03/06/2019  . Charcot's joint of foot 03/05/2019  . Fracture of tibia 03/05/2019  . Fracture of  capitate bone of wrist 02/26/2019  . Syncope 02/14/2019  . Bradycardia 02/01/2019  . Kidney stone 02/01/2019  . Nonrheumatic aortic valve stenosis 02/01/2019  . Surgery, elective   . Weakness   . Diabetes mellitus type 2 in nonobese (HCC)   . Leukocytosis   . Nondisplaced fracture of medial cuneiform of left foot, initial encounter for closed fracture 11/16/2018  . Cervical myelopathy ([place]) 11/14/2018  . Hyperlipidemia 10/30/2018  . Vitamin D deficiency 10/30/2018  . Osteoarthritis 10/30/2018  . GERD (gastroesophageal reflux disease) 10/30/2018  . Fatigue 10/30/2018  . Hypothyroidism 10/30/2018  . Essential hypertension 10/30/2018  . Mixed dyslipidemia 10/30/2018  . Diabetes mellitus due to underlying condition with unspecified complications ([place]) [id number]  . Diabetes ([place]) 10/30/2018  . Neoplasm of uncertain behavior of skin 07/28/2018  . Hemangioma of skin and subcutaneous tissue 07/07/2017  . Lentigo 07/07/2017  . Multiple actinic keratoses 07/07/2017  . Multiple benign melanocytic nevi 07/07/2017  . Other seborrheic keratosis 07/07/2017  . Complete heart block ([place]) 05/04/2017  . S/P AVR (aortic valve replacement) 03/10/2017  . Bicuspid aortic valve 12/22/2016  .  Benign prostatic hyperplasia without lower urinary tract symptoms 11/17/2016  . Chronic inflammatory arthritis 11/17/2016  . Chronic bilateral low back pain 11/17/2016  . Heart murmur 11/17/2016  . History of kidney stones 11/17/2016  . Migraine 11/17/2016  . Severe single current episode of major depressive disorder, without psychotic features ([place]) 11/17/2016  . Anxiety 06/20/2013  . Compartment syndrome ([place]) 06/20/2013  . Enthesopathy of ankle and tarsus 04/04/2013  . FHx: migraine headaches 04/04/2013  . Lesion of plantar nerve 04/04/2013  . Personal history of tobacco use, presenting hazards to health 04/04/2013  . History of bariatric surgery 04/04/2013  . Strain of rotator cuff 04/04/2013  . Neck  pain 04/04/2013  . Status post bariatric surgery 04/04/2013  . DM2 (diabetes mellitus, type 2) ([place]) 04/03/2013  . Clinical depression 04/03/2013  . Chronic back pain 04/03/2013  . Glaucoma 04/03/2013  . HA (headache) 04/03/2013  . Morbid obesity ([place]) 10/27/2011   Past Medical History:  Diagnosis Date  . Anxiety 10/30/2018  . Ascending aorta dilation ([place]) 03/06/2019  . Benign prostatic hyperplasia without lower urinary tract symptoms 11/17/2016   Last Assessment & Plan:  Stable PSA today.  Formatting of this note might be different from the original. Last Assessment & Plan:  Stable PSA today. Last Assessment & Plan:  Formatting of this note might be different from the original. Stable PSA today.  . Bicuspid aortic valve 12/22/2016  . BPH (benign prostatic hyperplasia)   . Bradycardia 02/01/2019  . Cervical myelopathy ([place]) 11/14/2018  . Charcot's joint of foot 03/05/2019  . Chronic back pain 04/03/2013  . Chronic bilateral low back pain 11/17/2016  . Chronic inflammatory arthritis 11/17/2016   History of positive rheumatoid factor in the past. Denies placement on immunosuppressive therapy  . Clinical depression 04/03/2013   Last Assessment & Plan:  Formatting of this note might be different from the original. Depression is unchanged.  Discussed sleep hygiene. Medication changes per orders. Depression will be reassessed in 3 months Has been having drowsiness since taking clonazepam. Wishes to try Buspar daily. Discussed non-pharmacologic methods to reduce anxiety/depression such as working in [employer] and relaxation  . Compartment syndrome ([place]) 06/20/2013   Last Assessment & Plan:  Denies any problems with right leg since April/May Does have swelling at times if he is on this leg for long periods of time. Wears compression socks regularly and has helped with swelling.  . Complete heart block ([place]) 05/04/2017  . Depression   . Diabetes ([place]) 10/30/2018  . Diabetes mellitus due to underlying  condition with unspecified complications ([place]) 0/04/6760  . Diabetes mellitus type 2 in nonobese (HCC)   . DM2 (diabetes mellitus, type 2) ([place]) 04/03/2013   Last Assessment & Plan:  Formatting of this note might be different from the original. Diabetes is unchanged.  Continue current treatment regimen. Regular aerobic exercise. Diabetes will be reassessed in 3 months. Will check A1c today. Denies any problems with feet or sensation.  . Enthesopathy of ankle and tarsus 04/04/2013   Last Assessment & Plan:  Continues with swelling after prolonged standing.  Still  Wearing support hose, which helps.  . Essential hypertension 10/30/2018  . Fatigue 10/30/2018  . FHx: migraine headaches 04/04/2013  . Fracture of capitate bone of wrist 02/26/2019  . Fracture of tibia 03/05/2019  . GERD (gastroesophageal reflux disease) 10/30/2018  . Glaucoma 04/03/2013  . HA (headache) 04/03/2013   Last Assessment & Plan:  Formatting of this note might be different from the original.  Infrequent, however still having them.  Not sure of the triggers.  Using zomig, which helps immediately.  Marland Kitchen Heart murmur 11/17/2016  . Hemangioma of skin and subcutaneous tissue 07/07/2017  . History of bariatric surgery 04/04/2013  . History of kidney stones 11/17/2016  . Hyperlipidemia 10/30/2018  . Hypertension   . Hypothyroidism 10/30/2018  . Kidney stone 02/01/2019   Last Assessment & Plan:  Renal condition is unchanged. Continue current medications. Renal condition will be reassessed in 3 months. Discussed increasing fluids, and possibility of pain if stone moves or causes obstruction. Told to call urologist or make appointment if problem worsens.  Last Assessment & Plan:  Formatting of this note might be different from the original. Renal condition is unchan  . Lentigo 07/07/2017  . Lesion of plantar nerve 04/04/2013  . Leukocytosis   . Migraine 11/17/2016  . Mixed dyslipidemia 10/30/2018  . Morbid obesity ([place]) 10/27/2011  . Multiple actinic  keratoses 07/07/2017  . Multiple benign melanocytic nevi 07/07/2017  . Neck pain 04/04/2013  . Neoplasm of uncertain behavior of skin 07/28/2018  . Nondisplaced fracture of medial cuneiform of left foot, initial encounter for closed fracture 11/16/2018  . Nonrheumatic aortic valve stenosis 02/01/2019  . Osteoarthritis 10/30/2018  . Other seborrheic keratosis 07/07/2017  . Personal history of tobacco use, presenting hazards to health 04/04/2013  . Presence of permanent cardiac pacemaker   . S/P AVR (aortic valve replacement) 03/10/2017  . Severe single current episode of major depressive disorder, without psychotic features ([place]) 11/17/2016  . Status post bariatric surgery 04/04/2013  . Strain of rotator cuff 04/04/2013  . Surgery, elective   . Syncope 02/14/2019  . Vitamin D deficiency 10/30/2018  . Weakness     Family History  Problem Relation Age of Onset  . Cancer Mother        breast  . Rheum arthritis Mother   . Diabetes Mother   . Heart disease Father   . Kidney disease Father   . Diabetes Father   . Hypertension Brother   . Diabetes Brother   . Osteoarthritis Brother   . Heart disease Brother   . Hypertension Brother   . Diabetes Brother   . Rheum arthritis Brother   . Depression Daughter   . Anxiety disorder Daughter     Past Surgical History:  Procedure Laterality Date  . ANTERIOR CERVICAL DECOMP/DISCECTOMY FUSION N/A 11/17/2018   Procedure: CERVICAL THREE-CERVICAL FOUR, CERVICAL FOUR-CERVICAL FIVE, CERVICAL FIVE-CERVICAL SIX ANTERIOR CERVICAL DECOMPRESSION/DISCECTOMY FUSION;  Surgeon: Earnie Larsson, MD;  Location: [facility];  Service: Neurosurgery;  Laterality: N/A;  . APPLICATION OF WOUND VAC Left 04/05/2019   Procedure: Application Of Wound Vac;  Surgeon: Erle Crocker, MD;  Location: [facility];  Service: Orthopedics;  Laterality: Left;  . BACK SURGERY     x4  . FASCIOTOMY Left 04/05/2019   Left leg 2 compartment fasciotomy  . FASCIOTOMY Left 04/05/2019   Procedure: FASCIOTOMY  LEFT LOWER LEG;  Surgeon: Erle Crocker, MD;  Location: [facility];  Service: Orthopedics;  Laterality: Left;  . FOOT NEUROMA SURGERY    . GASTRIC BYPASS    . HEMATOMA EVACUATION Left 04/05/2019   Procedure: Evacuation Hematoma Left Lower Leg;  Surgeon: Erle Crocker, MD;  Location: [facility];  Service: Orthopedics;  Laterality: Left;  . HEMORRHOID SURGERY     over 30 years ago  . HERNIA REPAIR     LIH umb  . NASAL SINUS SURGERY     x2  .  NECK SURGERY     Social History   Occupational History  . Not on file  Tobacco Use  . Smoking status: Never Smoker  . Smokeless tobacco: Never Used  Vaping Use  . Vaping Use: Never used  Substance and Sexual Activity  . Alcohol use: Yes    Alcohol/week: 1.0 standard drink    Types: 1 Glasses of wine per week    Comment: occ.  . Drug use: No  . Sexual activity: Not on file

## 2020-12-31 NOTE — Telephone Encounter (Signed)
Antonio Oliver pts wife is calling to consult with the doctor about her husband.She states her husband is going to have to have back surgery and wants to know is it ok to have.Please advise.

## 2020-12-31 NOTE — Telephone Encounter (Signed)
Full vm 

## 2020-12-31 NOTE — Patient Instructions (Signed)
Avoid bending, stooping and avoid lifting weights greater than 10 lbs. Avoid prolong standing and walking. Avoid frequent bending and stooping  No lifting greater than 10 lbs. May use ice or moist heat for pain. Weight loss is of benefit. Handicap license is approved. Lumbar MRI with and without enhancement to evaluate for adjacent level spinal stenosis L1-2 and L2-3. Likely you will need intervention to decompress the nerves that are pinched and causing right leg weakness.

## 2021-01-05 ENCOUNTER — Encounter (HOSPITAL_COMMUNITY): Payer: Self-pay

## 2021-01-05 ENCOUNTER — Inpatient Hospital Stay (HOSPITAL_COMMUNITY)
Admission: EM | Admit: 2021-01-05 | Discharge: 2021-01-13 | DRG: 517 | Disposition: A | Discharge status: Home Health | Payer: Medicare PPO | Attending: Internal Medicine | Admitting: Internal Medicine

## 2021-01-05 ENCOUNTER — Other Ambulatory Visit: Payer: Self-pay

## 2021-01-05 ENCOUNTER — Telehealth: Payer: Self-pay | Admitting: Specialist

## 2021-01-05 DIAGNOSIS — M5441 Lumbago with sciatica, right side: Secondary | ICD-10-CM

## 2021-01-05 DIAGNOSIS — I10 Essential (primary) hypertension: Secondary | ICD-10-CM | POA: Diagnosis present

## 2021-01-05 DIAGNOSIS — F419 Anxiety disorder, unspecified: Secondary | ICD-10-CM | POA: Diagnosis present

## 2021-01-05 DIAGNOSIS — T402X5A Adverse effect of other opioids, initial encounter: Secondary | ICD-10-CM | POA: Diagnosis present

## 2021-01-05 DIAGNOSIS — Z953 Presence of xenogenic heart valve: Secondary | ICD-10-CM

## 2021-01-05 DIAGNOSIS — G43909 Migraine, unspecified, not intractable, without status migrainosus: Secondary | ICD-10-CM | POA: Diagnosis present

## 2021-01-05 DIAGNOSIS — Z7982 Long term (current) use of aspirin: Secondary | ICD-10-CM

## 2021-01-05 DIAGNOSIS — G4700 Insomnia, unspecified: Secondary | ICD-10-CM | POA: Diagnosis present

## 2021-01-05 DIAGNOSIS — Z20822 Contact with and (suspected) exposure to covid-19: Secondary | ICD-10-CM | POA: Diagnosis present

## 2021-01-05 DIAGNOSIS — Z8249 Family history of ischemic heart disease and other diseases of the circulatory system: Secondary | ICD-10-CM

## 2021-01-05 DIAGNOSIS — D485 Neoplasm of uncertain behavior of skin: Secondary | ICD-10-CM | POA: Diagnosis present

## 2021-01-05 DIAGNOSIS — F1120 Opioid dependence, uncomplicated: Secondary | ICD-10-CM | POA: Diagnosis present

## 2021-01-05 DIAGNOSIS — K219 Gastro-esophageal reflux disease without esophagitis: Secondary | ICD-10-CM | POA: Diagnosis present

## 2021-01-05 DIAGNOSIS — E039 Hypothyroidism, unspecified: Secondary | ICD-10-CM | POA: Diagnosis present

## 2021-01-05 DIAGNOSIS — E782 Mixed hyperlipidemia: Secondary | ICD-10-CM | POA: Diagnosis present

## 2021-01-05 DIAGNOSIS — M5116 Intervertebral disc disorders with radiculopathy, lumbar region: Secondary | ICD-10-CM | POA: Diagnosis present

## 2021-01-05 DIAGNOSIS — Z950 Presence of cardiac pacemaker: Secondary | ICD-10-CM | POA: Diagnosis not present

## 2021-01-05 DIAGNOSIS — I712 Thoracic aortic aneurysm, without rupture: Secondary | ICD-10-CM | POA: Diagnosis present

## 2021-01-05 DIAGNOSIS — Z885 Allergy status to narcotic agent status: Secondary | ICD-10-CM | POA: Diagnosis not present

## 2021-01-05 DIAGNOSIS — I350 Nonrheumatic aortic (valve) stenosis: Secondary | ICD-10-CM | POA: Diagnosis present

## 2021-01-05 DIAGNOSIS — Z419 Encounter for procedure for purposes other than remedying health state, unspecified: Secondary | ICD-10-CM

## 2021-01-05 DIAGNOSIS — Z9884 Bariatric surgery status: Secondary | ICD-10-CM

## 2021-01-05 DIAGNOSIS — Z7984 Long term (current) use of oral hypoglycemic drugs: Secondary | ICD-10-CM | POA: Diagnosis not present

## 2021-01-05 DIAGNOSIS — Z01818 Encounter for other preprocedural examination: Secondary | ICD-10-CM | POA: Diagnosis not present

## 2021-01-05 DIAGNOSIS — M48062 Spinal stenosis, lumbar region with neurogenic claudication: Secondary | ICD-10-CM | POA: Diagnosis present

## 2021-01-05 DIAGNOSIS — Z981 Arthrodesis status: Secondary | ICD-10-CM | POA: Diagnosis not present

## 2021-01-05 DIAGNOSIS — N400 Enlarged prostate without lower urinary tract symptoms: Secondary | ICD-10-CM | POA: Diagnosis present

## 2021-01-05 DIAGNOSIS — Z833 Family history of diabetes mellitus: Secondary | ICD-10-CM

## 2021-01-05 DIAGNOSIS — N3289 Other specified disorders of bladder: Secondary | ICD-10-CM | POA: Diagnosis present

## 2021-01-05 DIAGNOSIS — I340 Nonrheumatic mitral (valve) insufficiency: Secondary | ICD-10-CM | POA: Diagnosis present

## 2021-01-05 DIAGNOSIS — G8929 Other chronic pain: Secondary | ICD-10-CM | POA: Diagnosis present

## 2021-01-05 DIAGNOSIS — M545 Low back pain, unspecified: Secondary | ICD-10-CM | POA: Diagnosis not present

## 2021-01-05 DIAGNOSIS — Z79890 Hormone replacement therapy (postmenopausal): Secondary | ICD-10-CM

## 2021-01-05 DIAGNOSIS — F32A Depression, unspecified: Secondary | ICD-10-CM | POA: Diagnosis present

## 2021-01-05 DIAGNOSIS — K5903 Drug induced constipation: Secondary | ICD-10-CM | POA: Diagnosis present

## 2021-01-05 DIAGNOSIS — M064 Inflammatory polyarthropathy: Secondary | ICD-10-CM | POA: Diagnosis present

## 2021-01-05 DIAGNOSIS — Z79899 Other long term (current) drug therapy: Secondary | ICD-10-CM

## 2021-01-05 DIAGNOSIS — E119 Type 2 diabetes mellitus without complications: Secondary | ICD-10-CM | POA: Diagnosis not present

## 2021-01-05 DIAGNOSIS — Z803 Family history of malignant neoplasm of breast: Secondary | ICD-10-CM

## 2021-01-05 DIAGNOSIS — M4326 Fusion of spine, lumbar region: Secondary | ICD-10-CM | POA: Diagnosis not present

## 2021-01-05 DIAGNOSIS — E11610 Type 2 diabetes mellitus with diabetic neuropathic arthropathy: Secondary | ICD-10-CM | POA: Diagnosis present

## 2021-01-05 DIAGNOSIS — M549 Dorsalgia, unspecified: Secondary | ICD-10-CM | POA: Diagnosis not present

## 2021-01-05 DIAGNOSIS — Z841 Family history of disorders of kidney and ureter: Secondary | ICD-10-CM

## 2021-01-05 DIAGNOSIS — Z8261 Family history of arthritis: Secondary | ICD-10-CM | POA: Diagnosis not present

## 2021-01-05 DIAGNOSIS — Z818 Family history of other mental and behavioral disorders: Secondary | ICD-10-CM

## 2021-01-05 DIAGNOSIS — I442 Atrioventricular block, complete: Secondary | ICD-10-CM | POA: Diagnosis not present

## 2021-01-05 DIAGNOSIS — Z87891 Personal history of nicotine dependence: Secondary | ICD-10-CM

## 2021-01-05 HISTORY — DX: Dorsalgia, unspecified: M54.9

## 2021-01-05 LAB — BASIC METABOLIC PANEL
Anion gap: 8 (ref 5–15)
BUN: 12 mg/dL (ref 8–23)
CO2: 25 mmol/L (ref 22–32)
Calcium: 9.4 mg/dL (ref 8.9–10.3)
Chloride: 108 mmol/L (ref 98–111)
Creatinine, Ser: 0.89 mg/dL (ref 0.61–1.24)
GFR, Estimated: >60 mL/min (ref >60)
Glucose, Bld: 128 mg/dL — ABNORMAL HIGH (ref 70–99)
Potassium: 4.5 mmol/L (ref 3.5–5.1)
Sodium: 141 mmol/L (ref 135–145)

## 2021-01-05 LAB — RESP PANEL BY RT-PCR (FLU A&B, COVID) ARPGX2
Influenza A by PCR: NEGATIVE
Influenza B by PCR: NEGATIVE
SARS Coronavirus 2 by RT PCR: NEGATIVE

## 2021-01-05 LAB — CBC WITH DIFFERENTIAL/PLATELET
Abs Immature Granulocytes: 0.04 10*3/uL (ref 0.00–0.07)
Basophils Absolute: 0.1 10*3/uL (ref 0.0–0.1)
Basophils Relative: 1 %
Eosinophils Absolute: 0.5 10*3/uL (ref 0.0–0.5)
Eosinophils Relative: 6 %
HCT: 40.6 % (ref 39.0–52.0)
Hemoglobin: 13.9 g/dL (ref 13.0–17.0)
Immature Granulocytes: 1 %
Lymphocytes Relative: 26 %
Lymphs Abs: 2.2 10*3/uL (ref 0.7–4.0)
MCH: 32.3 pg (ref 26.0–34.0)
MCHC: 34.2 g/dL (ref 30.0–36.0)
MCV: 94.4 fL (ref 80.0–100.0)
Monocytes Absolute: 0.7 10*3/uL (ref 0.1–1.0)
Monocytes Relative: 8 %
Neutro Abs: 4.9 10*3/uL (ref 1.7–7.7)
Neutrophils Relative %: 58 %
Platelets: 241 10*3/uL (ref 150–400)
RBC: 4.3 MIL/uL (ref 4.22–5.81)
RDW: 12 % (ref 11.5–15.5)
WBC: 8.4 10*3/uL (ref 4.0–10.5)
nRBC: 0 % (ref 0.0–0.2)

## 2021-01-05 LAB — GLUCOSE, CAPILLARY
Glucose-Capillary: 138 mg/dL — ABNORMAL HIGH (ref 70–99)
Glucose-Capillary: 112 mg/dL — ABNORMAL HIGH (ref 70–99)

## 2021-01-05 LAB — HIV ANTIBODY (ROUTINE TESTING W REFLEX): HIV Screen 4th Generation wRfx: NONREACTIVE

## 2021-01-05 LAB — HEMOGLOBIN A1C
Hgb A1c MFr Bld: 6.2 % — ABNORMAL HIGH (ref 4.8–5.6)
Mean Plasma Glucose: 131.24 mg/dL

## 2021-01-05 MED ORDER — ASPIRIN EC 81 MG PO TBEC
81.0000 mg | DELAYED_RELEASE_TABLET | Freq: Every day | ORAL | Status: DC
  Administered 2021-01-06 – 2021-01-07 (×2): 81 mg via ORAL
  Filled 2021-01-05 (×2): qty 1

## 2021-01-05 MED ORDER — LEVOTHYROXINE SODIUM 25 MCG PO TABS
125.0000 ug | ORAL_TABLET | Freq: Every day | ORAL | Status: DC
  Administered 2021-01-06 – 2021-01-13 (×8): 125 ug via ORAL
  Filled 2021-01-05 (×8): qty 1

## 2021-01-05 MED ORDER — UBROGEPANT 100 MG PO TABS: 100.0000 mg | ORAL_TABLET | Freq: Every day | ORAL | Status: DC | PRN

## 2021-01-05 MED ORDER — DARIFENACIN HYDROBROMIDE ER 7.5 MG PO TB24
7.5000 mg | ORAL_TABLET | Freq: Every day | ORAL | Status: DC
  Administered 2021-01-06 – 2021-01-13 (×8): 7.5 mg via ORAL
  Filled 2021-01-05 (×8): qty 1

## 2021-01-05 MED ORDER — METOPROLOL SUCCINATE ER 25 MG PO TB24
25.0000 mg | ORAL_TABLET | Freq: Every day | ORAL | Status: DC
  Administered 2021-01-05 – 2021-01-12 (×7): 25 mg via ORAL
  Filled 2021-01-05 (×8): qty 1

## 2021-01-05 MED ORDER — BUPROPION HCL ER (SR) 100 MG PO TB12
200.0000 mg | ORAL_TABLET | Freq: Two times a day (BID) | ORAL | Status: DC
  Administered 2021-01-05 – 2021-01-13 (×15): 200 mg via ORAL
  Filled 2021-01-05 (×17): qty 2

## 2021-01-05 MED ORDER — BREXPIPRAZOLE 2 MG PO TABS
2.0000 mg | ORAL_TABLET | Freq: Every day | ORAL | Status: DC
  Administered 2021-01-05 – 2021-01-13 (×9): 2 mg via ORAL
  Filled 2021-01-05 (×9): qty 1

## 2021-01-05 MED ORDER — INSULIN ASPART 100 UNIT/ML IJ SOLN
0.0000 [IU] | Freq: Three times a day (TID) | INTRAMUSCULAR | Status: DC
  Administered 2021-01-05: 2 [IU] via SUBCUTANEOUS
  Administered 2021-01-06: 3 [IU] via SUBCUTANEOUS
  Administered 2021-01-06: 2 [IU] via SUBCUTANEOUS
  Administered 2021-01-07 (×2): 3 [IU] via SUBCUTANEOUS
  Administered 2021-01-08: 2 [IU] via SUBCUTANEOUS
  Administered 2021-01-09 – 2021-01-12 (×4): 3 [IU] via SUBCUTANEOUS

## 2021-01-05 MED ORDER — CLONAZEPAM 0.5 MG PO TABS
0.5000 mg | ORAL_TABLET | Freq: Two times a day (BID) | ORAL | Status: DC | PRN
  Administered 2021-01-08 – 2021-01-12 (×4): 0.5 mg via ORAL
  Filled 2021-01-05 (×4): qty 1

## 2021-01-05 MED ORDER — FINASTERIDE 5 MG PO TABS
5.0000 mg | ORAL_TABLET | Freq: Every day | ORAL | Status: DC
  Administered 2021-01-06 – 2021-01-13 (×8): 5 mg via ORAL
  Filled 2021-01-05 (×8): qty 1

## 2021-01-05 MED ORDER — OXYCODONE-ACETAMINOPHEN 10-325 MG PO TABS: 1.0000 | ORAL_TABLET | Freq: Three times a day (TID) | ORAL | Status: DC

## 2021-01-05 MED ORDER — ONDANSETRON HCL 4 MG PO TABS: 4.0000 mg | ORAL_TABLET | Freq: Four times a day (QID) | ORAL | Status: DC | PRN

## 2021-01-05 MED ORDER — MAGNESIUM OXIDE -MG SUPPLEMENT 400 (240 MG) MG PO TABS
400.0000 mg | ORAL_TABLET | Freq: Two times a day (BID) | ORAL | Status: DC
  Administered 2021-01-05 – 2021-01-13 (×15): 400 mg via ORAL
  Filled 2021-01-05 (×15): qty 1

## 2021-01-05 MED ORDER — ONDANSETRON HCL 4 MG/2ML IJ SOLN: 4.0000 mg | Freq: Four times a day (QID) | INTRAMUSCULAR | Status: DC | PRN

## 2021-01-05 MED ORDER — FOLIC ACID 1 MG PO TABS
1.0000 mg | ORAL_TABLET | Freq: Every day | ORAL | Status: DC
  Administered 2021-01-06 – 2021-01-13 (×8): 1 mg via ORAL
  Filled 2021-01-05 (×8): qty 1

## 2021-01-05 MED ORDER — OXYCODONE HCL 5 MG PO TABS
5.0000 mg | ORAL_TABLET | Freq: Three times a day (TID) | ORAL | Status: DC
  Administered 2021-01-05 – 2021-01-07 (×6): 5 mg via ORAL
  Filled 2021-01-05 (×6): qty 1

## 2021-01-05 MED ORDER — ADULT MULTIVITAMIN W/MINERALS CH
1.0000 | ORAL_TABLET | Freq: Every day | ORAL | Status: DC
  Administered 2021-01-06 – 2021-01-13 (×8): 1 via ORAL
  Filled 2021-01-05 (×9): qty 1

## 2021-01-05 MED ORDER — ATORVASTATIN CALCIUM 10 MG PO TABS
20.0000 mg | ORAL_TABLET | Freq: Every day | ORAL | Status: DC
  Administered 2021-01-05 – 2021-01-12 (×7): 20 mg via ORAL
  Filled 2021-01-05 (×7): qty 2

## 2021-01-05 MED ORDER — TIZANIDINE HCL 4 MG PO TABS
4.0000 mg | ORAL_TABLET | Freq: Three times a day (TID) | ORAL | Status: DC | PRN
  Administered 2021-01-05 – 2021-01-10 (×2): 8 mg via ORAL
  Administered 2021-01-11: 4 mg via ORAL
  Filled 2021-01-05 (×2): qty 2
  Filled 2021-01-05: qty 1

## 2021-01-05 MED ORDER — MIRTAZAPINE 15 MG PO TABS
30.0000 mg | ORAL_TABLET | Freq: Every day | ORAL | Status: DC
  Administered 2021-01-05 – 2021-01-12 (×7): 30 mg via ORAL
  Filled 2021-01-05 (×7): qty 2

## 2021-01-05 MED ORDER — DULOXETINE HCL 60 MG PO CPEP
60.0000 mg | ORAL_CAPSULE | Freq: Two times a day (BID) | ORAL | Status: DC
  Administered 2021-01-05 – 2021-01-13 (×15): 60 mg via ORAL
  Filled 2021-01-05 (×15): qty 1

## 2021-01-05 MED ORDER — GABAPENTIN 300 MG PO CAPS
600.0000 mg | ORAL_CAPSULE | Freq: Three times a day (TID) | ORAL | Status: DC
  Administered 2021-01-05 – 2021-01-09 (×12): 600 mg via ORAL
  Administered 2021-01-10: 1200 mg via ORAL
  Administered 2021-01-10 – 2021-01-13 (×9): 600 mg via ORAL
  Filled 2021-01-05 (×20): qty 2
  Filled 2021-01-05: qty 4
  Filled 2021-01-05: qty 2

## 2021-01-05 MED ORDER — BUSPIRONE HCL 10 MG PO TABS
20.0000 mg | ORAL_TABLET | Freq: Two times a day (BID) | ORAL | Status: DC
  Administered 2021-01-05 – 2021-01-13 (×15): 20 mg via ORAL
  Filled 2021-01-05 (×15): qty 2

## 2021-01-05 MED ORDER — INSULIN ASPART 100 UNIT/ML IJ SOLN: 0.0000 [IU] | Freq: Every day | INTRAMUSCULAR | Status: DC

## 2021-01-05 MED ORDER — OXYCODONE-ACETAMINOPHEN 5-325 MG PO TABS
1.0000 | ORAL_TABLET | Freq: Three times a day (TID) | ORAL | Status: DC
  Administered 2021-01-05 – 2021-01-07 (×6): 1 via ORAL
  Filled 2021-01-05 (×6): qty 1

## 2021-01-05 NOTE — ED Notes (Signed)
Spoke with Adela Lank RN in ED. Informed her if warrented pt would have to be transferred to [facility] due to pt having a pacemaker. RN informed, this has to be setup and scheudle with [facility] directly. She stated she would pass this along. Called and spoke with Tim at [facility] about this.

## 2021-01-05 NOTE — Plan of Care (Signed)

## 2021-01-05 NOTE — ED Triage Notes (Signed)
Emergency Medicine Provider Triage Evaluation Note  Antonio Oliver , a 65 y.o. male  was evaluated in triage.  Pt complains of low back pain with progressively worsening right leg weakness, seen by Dr. Louanne Skye with plan for MRI lumbar spine 5/10 however with worsening leg weakness, advised to go to the ER for MRI.  Review of Systems  Positive: Leg weakness, back pain Negative: Loss of bowel or bladder control, abdominal pain, groin parenstesia  Physical Exam  BP (!) 160/102 (BP Location: Left Arm)   Pulse 63   Temp 98 F (36.7 C) (Oral)   Resp 16   Ht 6\' 3"  (1.905 m)   Wt 90.3 kg   SpO2 98%   BMI 24.87 kg/m  Gen:   Awake, no distress   HEENT:  Atraumatic  Resp:  Normal effort  Cardiac:  Normal rate  Abd:   Nondistended, nontender  MSK:   Right leg weakness although still able move hip/knee/ankle/great toe Neuro:  Speech clear   Medical Decision Making  Medically screening exam initiated at 12:06 PM.  Appropriate orders placed.  Antonio Oliver was informed that the remainder of the evaluation will be completed by another provider, this initial triage assessment does not replace that evaluation, and the importance of remaining in the ED until their evaluation is complete.  Clinical Impression  MRI ordered however has pacemaker which might delay imaging today, screening labs ordered for possible admission.   Tacy Learn, PA-C 01/05/21 1208

## 2021-01-05 NOTE — ED Notes (Signed)
[facility] here at the bedside- Copies of medical information provided and report given

## 2021-01-05 NOTE — ED Triage Notes (Signed)
Patient's wife reports that the patient has had right leg pain and partial paralysis. Patient reports that he is unable to walk or stand without assistance. Patient saw an ortho surgeon 5 days ago and ordered an MRI which is scheduled for 01/13/21, but was told if paralysis worsened to come to the ED.

## 2021-01-05 NOTE — ED Notes (Signed)
Report called to [facility], [place] to be notified of need for transportation

## 2021-01-05 NOTE — ED Notes (Signed)
Patient waiting for possible transfer to [facility], so that he may have his MRI done

## 2021-01-05 NOTE — Telephone Encounter (Signed)
Pt wife states that pt is in a lot of pain and things have gotten worse. She thinks he will be paralyzed if he isn't seen soon. CB [phone]

## 2021-01-05 NOTE — ED Notes (Signed)
Attempted to call report- Charge nurse was busy with another admission patient.  Was asked to call back

## 2021-01-05 NOTE — H&P (Signed)
History and Physical    Antonio Oliver ZSW:[id number] DOB: [birth year] DOA: 01/05/2021  PCP: Cher Nakai, MD  Patient coming from: Home  Chief Complaint: back pain, right leg weakness  HPI: Antonio Oliver is a 65 y.o. male with medical history significant of DM2, HTN, HLD, neck surgery. Presenting with back pain and right lower extremity weakness. He reports that he woke up 5 days ago and could not walk. He went to an [facility] where he had a w/u that ruled out stroke. However, he was unable to get an MRI d/t a pacer. It was recommended that he follow up with a back specialist. He did regain some strength and ability to mobilize. So he followed up with his previous back specialist (Dr. Louanne Skye) who ordered a MRI of his back to be completed on 5/10. This morning he woke up with worsening back pain and the inability to walk again. The pain was in his lower back and radiating down his right hip into his right leg. It was a burning pain that turned sharp with movement. He tried his percocet, but it didn't help. He spoke with his back team. It was recommended that he come to the ED. He denies any other aggravating or alleviating factors.   ED Course: Attempted to acquire MRI. Only able to do at [facility] d/t his pacer. MRI will not be able to be acquired until tomorrow AM. EDP spoke with on-call ortho. They recommended admission to [facility], get MRI, and reconsult in AM as his back specialist will be there at that time.   Review of Systems:  Denies CP, dyspnea, palpitations, abdominal pain, incontinence, syncopal episodes Review of systems is otherwise negative for all not mentioned in HPI.   PMHx Past Medical History:  Diagnosis Date  . Anxiety 10/30/2018  . Ascending aorta dilation (Antonio Oliver) 03/06/2019  . Benign prostatic hyperplasia without lower urinary tract symptoms 11/17/2016   Last Assessment & Plan:  Stable PSA today.  Formatting of this note might be different from the original. Last Assessment & Plan:   Stable PSA today. Last Assessment & Plan:  Formatting of this note might be different from the original. Stable PSA today.  . Bicuspid aortic valve 12/22/2016  . BPH (benign prostatic hyperplasia)   . Bradycardia 02/01/2019  . Cervical myelopathy (Antonio Oliver) 11/14/2018  . Charcot's joint of foot 03/05/2019  . Chronic back pain 04/03/2013  . Chronic bilateral low back pain 11/17/2016  . Chronic inflammatory arthritis 11/17/2016   History of positive rheumatoid factor in the past. Denies placement on immunosuppressive therapy  . Clinical depression 04/03/2013   Last Assessment & Plan:  Formatting of this note might be different from the original. Depression is unchanged.  Discussed sleep hygiene. Medication changes per orders. Depression will be reassessed in 3 months Has been having drowsiness since taking clonazepam. Wishes to try Buspar daily. Discussed non-pharmacologic methods to reduce anxiety/depression such as working in [employer] and relaxation  . Compartment syndrome ([place]) 06/20/2013   Last Assessment & Plan:  Denies any problems with right leg since April/May Does have swelling at times if he is on this leg for long periods of time. Wears compression socks regularly and has helped with swelling.  . Complete heart block (Antonio Oliver) 05/04/2017  . Depression   . Diabetes (Antonio Oliver) 10/30/2018  . Diabetes mellitus due to underlying condition with unspecified complications (Antonio Oliver) 11/25/252  . Diabetes mellitus type 2 in nonobese (HCC)   . DM2 (diabetes mellitus, type 2) (Antonio Oliver)  04/03/2013   Last Assessment & Plan:  Formatting of this note might be different from the original. Diabetes is unchanged.  Continue current treatment regimen. Regular aerobic exercise. Diabetes will be reassessed in 3 months. Will check A1c today. Denies any problems with feet or sensation.  . Enthesopathy of ankle and tarsus 04/04/2013   Last Assessment & Plan:  Continues with swelling after prolonged standing.  Still  Wearing support hose, which  helps.  . Essential hypertension 10/30/2018  . Fatigue 10/30/2018  . FHx: migraine headaches 04/04/2013  . Fracture of capitate bone of wrist 02/26/2019  . Fracture of tibia 03/05/2019  . GERD (gastroesophageal reflux disease) 10/30/2018  . Glaucoma 04/03/2013  . HA (headache) 04/03/2013   Last Assessment & Plan:  Formatting of this note might be different from the original. Infrequent, however still having them.  Not sure of the triggers.  Using zomig, which helps immediately.  Marland Kitchen Heart murmur 11/17/2016  . Hemangioma of skin and subcutaneous tissue 07/07/2017  . History of bariatric surgery 04/04/2013  . History of kidney stones 11/17/2016  . Hyperlipidemia 10/30/2018  . Hypertension   . Hypothyroidism 10/30/2018  . Kidney stone 02/01/2019   Last Assessment & Plan:  Renal condition is unchanged. Continue current medications. Renal condition will be reassessed in 3 months. Discussed increasing fluids, and possibility of pain if stone moves or causes obstruction. Told to call urologist or make appointment if problem worsens.  Last Assessment & Plan:  Formatting of this note might be different from the original. Renal condition is unchan  . Lentigo 07/07/2017  . Lesion of plantar nerve 04/04/2013  . Leukocytosis   . Migraine 11/17/2016  . Mixed dyslipidemia 10/30/2018  . Morbid obesity (Antonio Oliver) 10/27/2011  . Multiple actinic keratoses 07/07/2017  . Multiple benign melanocytic nevi 07/07/2017  . Neck pain 04/04/2013  . Neoplasm of uncertain behavior of skin 07/28/2018  . Nondisplaced fracture of medial cuneiform of left foot, initial encounter for closed fracture 11/16/2018  . Nonrheumatic aortic valve stenosis 02/01/2019  . Osteoarthritis 10/30/2018  . Other seborrheic keratosis 07/07/2017  . Personal history of tobacco use, presenting hazards to health 04/04/2013  . Presence of permanent cardiac pacemaker   . S/P AVR (aortic valve replacement) 03/10/2017  . Severe single current episode of major depressive  disorder, without psychotic features ([place]) 11/17/2016  . Status post bariatric surgery 04/04/2013  . Strain of rotator cuff 04/04/2013  . Surgery, elective   . Syncope 02/14/2019  . Vitamin D deficiency 10/30/2018  . Weakness     PSHx Past Surgical History:  Procedure Laterality Date  . ANTERIOR CERVICAL DECOMP/DISCECTOMY FUSION N/A 11/17/2018   Procedure: CERVICAL THREE-CERVICAL FOUR, CERVICAL FOUR-CERVICAL FIVE, CERVICAL FIVE-CERVICAL SIX ANTERIOR CERVICAL DECOMPRESSION/DISCECTOMY FUSION;  Surgeon: Earnie Larsson, MD;  Location: [place];  Service: Neurosurgery;  Laterality: N/A;  . APPLICATION OF WOUND VAC Left 04/05/2019   Procedure: Application Of Wound Vac;  Surgeon: Erle Crocker, MD;  Location: [place];  Service: Orthopedics;  Laterality: Left;  . BACK SURGERY     x4  . FASCIOTOMY Left 04/05/2019   Left leg 2 compartment fasciotomy  . FASCIOTOMY Left 04/05/2019   Procedure: FASCIOTOMY LEFT LOWER LEG;  Surgeon: Erle Crocker, MD;  Location: [place];  Service: Orthopedics;  Laterality: Left;  . FOOT NEUROMA SURGERY    . GASTRIC BYPASS    . HEMATOMA EVACUATION Left 04/05/2019   Procedure: Evacuation Hematoma Left Lower Leg;  Surgeon: Erle Crocker, MD;  Location: [facility];  Service: Orthopedics;  Laterality: Left;  . HEMORRHOID SURGERY     over 30 years ago  . HERNIA REPAIR     LIH umb  . NASAL SINUS SURGERY     x2  . NECK SURGERY      SocHx  reports that he has never smoked. He has never used smokeless tobacco. He reports current alcohol use of about 1.0 standard drink of alcohol per week. He reports that he does not use drugs.  Allergies  Allergen Reactions  . Morphine Itching    Can take with benadry, facial and nose itching  Can take with benadry, facial and nose itching Can take with benadry, facial and nose itching    FamHx Family History  Problem Relation Age of Onset  . Cancer Mother        breast  . Rheum arthritis Mother   . Diabetes Mother   .  Heart disease Father   . Kidney disease Father   . Diabetes Father   . Hypertension Brother   . Diabetes Brother   . Osteoarthritis Brother   . Heart disease Brother   . Hypertension Brother   . Diabetes Brother   . Rheum arthritis Brother   . Depression Daughter   . Anxiety disorder Daughter     Prior to Admission medications   Medication Sig Start Date End Date Taking? Authorizing Provider  aspirin EC 81 MG tablet Take 81 mg by mouth daily.    [provider]  atorvastatin (LIPITOR) 20 MG tablet Take 20 mg by mouth daily at 6 PM.     [provider]  brexpiprazole (REXULTI) 2 MG TABS tablet Take 2 mg by mouth daily.     [provider]  buPROPion (WELLBUTRIN SR) 200 MG 12 hr tablet Take 200 mg by mouth 2 (two) times daily.    [provider]  busPIRone (BUSPAR) 10 MG tablet Take 20 mg by mouth 2 (two) times daily.     [provider]  calcium citrate (CALCITRATE - DOSED IN MG ELEMENTAL CALCIUM) 950 MG tablet Take 200 mg of elemental calcium by mouth 2 (two) times daily.    [provider]  Cholecalciferol 125 MCG (5000 UT) TABS Take 125 mcg by mouth daily.    [provider]  clonazePAM (KLONOPIN) 0.5 MG tablet Take 0.5 mg by mouth 2 (two) times daily as needed for anxiety.    [provider]  COVID-19 mRNA vaccine, Pfizer, 30 MCG/0.3ML injection USE AS DIRECTED 06/24/20 06/24/21  Carlyle Basques, MD  DULoxetine (CYMBALTA) 60 MG capsule Take 60 mg by mouth 2 (two) times daily.     [provider]  fexofenadine (ALLEGRA) 180 MG tablet Take 180 mg by mouth daily.    [provider]  finasteride (PROSCAR) 5 MG tablet Take 5 mg by mouth daily.    [provider]  fluticasone (FLONASE) 50 MCG/ACT nasal spray Place 2 sprays into the nose as needed for allergies or rhinitis.    [provider]  folic acid (FOLVITE) 1 MG tablet Take 1 mg by mouth daily.    [provider]   furosemide (LASIX) 20 MG tablet Take 1 tablet (20 mg total) by mouth as needed (for leg swelling). 07/12/19   Revankar, Reita Cliche, MD  gabapentin (NEURONTIN) 600 MG tablet Take 600-1,200 mg by mouth 3 (three) times daily.    [provider]  Galcanezumab-gnlm (EMGALITY) 120 MG/ML SOAJ Inject 120 mg into the skin  every 28 (twenty-eight) days. 12/23/20   Pieter Partridge, DO  levothyroxine (SYNTHROID) 125 MCG tablet Take 125 mcg by mouth daily.     [provider]  lisinopril (ZESTRIL) 5 MG tablet Take 5 mg daily and as needed for BP greater than 160. 12/05/20   Revankar, Reita Cliche, MD  Magnesium 500 MG CAPS Take 500 mg by mouth 2 (two) times a day.     [provider]  metFORMIN (GLUCOPHAGE) 1000 MG tablet Take 1,000 mg by mouth 2 (two) times daily with a meal.    [provider]  metoprolol succinate (TOPROL-XL) 25 MG 24 hr tablet Take 25 mg by mouth in the morning and at bedtime.    [provider]  mirtazapine (REMERON) 30 MG tablet Take 30 mg by mouth at bedtime.     [provider]  Multiple Vitamins-Minerals (MULTIVITAMIN WITH MINERALS) tablet Take 1 tablet by mouth daily.    [provider]  omeprazole (PRILOSEC) 20 MG capsule Take 20 mg by mouth 2 (two) times daily before a meal.     [provider]  oxyCODONE-acetaminophen (PERCOCET) 10-325 MG tablet Take 1 tablet by mouth 3 (three) times daily.    [provider]  potassium chloride SA (KLOR-CON) 20 MEQ tablet Take 20 mEq by mouth as needed. With lasix    [provider]  pramipexole (MIRAPEX) 1 MG tablet Take 1 mg by mouth at bedtime.     [provider]  Semaglutide (OZEMPIC, 0.25 OR 0.5 MG/DOSE, [place]) Inject 0.5 mg into the skin once a week.    [provider]  solifenacin (VESICARE) 10 MG tablet Take 10 mg by mouth daily.    [provider]  testosterone cypionate (DEPO-TESTOSTERONE) 200 MG/ML injection Inject 200 mg into the muscle  every 30 (thirty) days.    [provider]  tiZANidine (ZANAFLEX) 4 MG capsule Take 4-8 mg by mouth 3 (three) times daily as needed for muscle spasms.    [provider]  traMADol (ULTRAM) 50 MG tablet Take 50 mg by mouth 3 (three) times daily.    [provider]  Ubrogepant 100 MG TABS Take 100 mg by mouth as needed for migraine.    [provider]    Physical Exam: Vitals:   01/05/21 1155 01/05/21 1200 01/05/21 1324  BP: (!) 160/102  (!) 147/95  Pulse: 63  63  Resp: 16  18  Temp: 98 F (36.7 C)    TempSrc: Oral    SpO2: 98%  98%  Weight:  90.3 kg   Height:  6\' 3"  (1.905 m)     General: 66 y.o. male resting in bed in NAD Eyes: PERRL, normal sclera ENMT: Nares patent w/o discharge, orophaynx clear, dentition normal, ears w/o discharge/lesions/ulcers Neck: Supple, trachea midline Cardiovascular: RRR, +S1, S2, no g/r, 1/6 SEM equal pulses throughout Respiratory: CTABL, no w/r/r, normal WOB GI: BS+, NDNT, no masses noted, no organomegaly noted MSK: No e/c/c Skin: No rashes, ulcerations noted; there are multiple bruises/macular lesions noted on arms Neuro: A&O x 3, general symmetrical weakness of the upper extremities -- 4-5/5 BUE MSK strg w/ slight weak hand grips as a residual of previous neck surgery, LLE 4-5/5 MSK strg w/ intact sensation and no foot drop; RLE 3-4/5 MSK strg w/ intact sensation and no appreciable foor drop; denies saddle parasthesia Psyc: Appropriate interaction and affect, calm/cooperative  Labs on Admission: I have personally reviewed following labs and imaging studies  CBC: Recent  Labs  Lab 01/05/21 1233  WBC 8.4  NEUTROABS 4.9  HGB 13.9  HCT 40.6  MCV 94.4  PLT [id number]   Basic Metabolic Panel: Recent Labs  Lab 01/05/21 1233  NA 141  K 4.5  CL 108  CO2 25  GLUCOSE 128*  BUN 12  CREATININE 0.89  CALCIUM 9.4   GFR: Estimated Creatinine Clearance: 101.5 mL/min (by C-G formula based on SCr of 0.89  mg/dL). Liver Function Tests: No results for input(s): AST, ALT, ALKPHOS, BILITOT, PROT, ALBUMIN in the last 168 hours. No results for input(s): LIPASE, AMYLASE in the last 168 hours. No results for input(s): AMMONIA in the last 168 hours. Coagulation Profile: No results for input(s): INR, PROTIME in the last 168 hours. Cardiac Enzymes: No results for input(s): CKTOTAL, CKMB, CKMBINDEX, TROPONINI in the last 168 hours. BNP (last 3 results) No results for input(s): PROBNP in the last 8760 hours. HbA1C: No results for input(s): HGBA1C in the last 72 hours. CBG: No results for input(s): GLUCAP in the last 168 hours. Lipid Profile: No results for input(s): CHOL, HDL, LDLCALC, TRIG, CHOLHDL, LDLDIRECT in the last 72 hours. Thyroid Function Tests: No results for input(s): TSH, T4TOTAL, FREET4, T3FREE, THYROIDAB in the last 72 hours. Anemia Panel: No results for input(s): VITAMINB12, FOLATE, FERRITIN, TIBC, IRON, RETICCTPCT in the last 72 hours. Urine analysis:    Component Value Date/Time   COLORURINE YELLOW 02/14/2019 2024   APPEARANCEUR CLEAR 02/14/2019 2024   LABSPEC 1.019 02/14/2019 2024   PHURINE 5.0 02/14/2019 2024   GLUCOSEU 50 (A) 02/14/2019 2024   HGBUR NEGATIVE 02/14/2019 2024   Scalp Level NEGATIVE 02/14/2019 2024   KETONESUR NEGATIVE 02/14/2019 2024   PROTEINUR NEGATIVE 02/14/2019 2024   UROBILINOGEN 0.2 08/23/2007 1324   NITRITE NEGATIVE 02/14/2019 2024   LEUKOCYTESUR NEGATIVE 02/14/2019 2024    Radiological Exams on Admission: No results found.  EKG: Independently reviewed. Sinus, no st elevations  Assessment/Plan Low back pain Right LE pain/weakness     - place in obs, med-surg @ [facility]     - reconsult ortho in the AM (Dr. Louanne Skye w/ [facility], his regular back specialist)     - MRI L-spine w/ w/o contrast ordered     - PRN pain control  HTN     - continue home metoprolol  HLD     - continue home statin  DM2     - DM diet, SSI, glucose checks,  A1c  BPH     - continue home proscar  Hypogonadism      - continue outpt follow up  Anxiety Depression     - continue home regimen  Hypothyroidism     - continue home synthroid  DVT prophylaxis: SCDs  Code Status: DNI, confirmed with pt and wife at bedside  Family Communication: w/ wife at bedside  Consults called: EDP spoke with orthopedics (Dr. Rush Farmer)   Status is: Observation  The patient remains OBS appropriate and will d/c before 2 midnights.  Dispo: The patient is from: Home              Anticipated d/c is to: Home              Patient currently is not medically stable to d/c.   Difficult to place patient No  Jonnie Finner DO [facility]  If 7PM-7AM, please contact night-coverage [email or web address]  01/05/2021, 1:54 PM

## 2021-01-06 ENCOUNTER — Observation Stay (HOSPITAL_COMMUNITY): Payer: Medicare PPO

## 2021-01-06 DIAGNOSIS — M5441 Lumbago with sciatica, right side: Secondary | ICD-10-CM | POA: Diagnosis not present

## 2021-01-06 DIAGNOSIS — M545 Low back pain, unspecified: Secondary | ICD-10-CM | POA: Diagnosis not present

## 2021-01-06 LAB — COMPREHENSIVE METABOLIC PANEL
ALT: 49 U/L — ABNORMAL HIGH (ref 0–44)
AST: 23 U/L (ref 15–41)
Albumin: 3.1 g/dL — ABNORMAL LOW (ref 3.5–5.0)
Alkaline Phosphatase: 60 U/L (ref 38–126)
Anion gap: 7 (ref 5–15)
BUN: 10 mg/dL (ref 8–23)
CO2: 26 mmol/L (ref 22–32)
Calcium: 9.1 mg/dL (ref 8.9–10.3)
Chloride: 106 mmol/L (ref 98–111)
Creatinine, Ser: 1 mg/dL (ref 0.61–1.24)
GFR, Estimated: >60 mL/min (ref >60)
Glucose, Bld: 129 mg/dL — ABNORMAL HIGH (ref 70–99)
Potassium: 4.3 mmol/L (ref 3.5–5.1)
Sodium: 139 mmol/L (ref 135–145)
Total Bilirubin: 0.8 mg/dL (ref 0.3–1.2)
Total Protein: 5.4 g/dL — ABNORMAL LOW (ref 6.5–8.1)

## 2021-01-06 LAB — CBC
HCT: 39 % (ref 39.0–52.0)
Hemoglobin: 13.2 g/dL (ref 13.0–17.0)
MCH: 31.3 pg (ref 26.0–34.0)
MCHC: 33.8 g/dL (ref 30.0–36.0)
MCV: 92.4 fL (ref 80.0–100.0)
Platelets: 188 10*3/uL (ref 150–400)
RBC: 4.22 MIL/uL (ref 4.22–5.81)
RDW: 11.9 % (ref 11.5–15.5)
WBC: 7.5 10*3/uL (ref 4.0–10.5)
nRBC: 0 % (ref 0.0–0.2)

## 2021-01-06 LAB — GLUCOSE, CAPILLARY
Glucose-Capillary: 116 mg/dL — ABNORMAL HIGH (ref 70–99)
Glucose-Capillary: 124 mg/dL — ABNORMAL HIGH (ref 70–99)
Glucose-Capillary: 128 mg/dL — ABNORMAL HIGH (ref 70–99)
Glucose-Capillary: 169 mg/dL — ABNORMAL HIGH (ref 70–99)
Glucose-Capillary: 166 mg/dL — ABNORMAL HIGH (ref 70–99)

## 2021-01-06 MED ORDER — LORAZEPAM 2 MG/ML IJ SOLN
1.0000 mg | Freq: Once | INTRAMUSCULAR | Status: AC
  Administered 2021-01-06: 1 mg via INTRAVENOUS
  Filled 2021-01-06: qty 1

## 2021-01-06 MED ORDER — HYDROMORPHONE HCL 1 MG/ML IJ SOLN
0.5000 mg | INTRAMUSCULAR | Status: DC | PRN
  Administered 2021-01-07 – 2021-01-08 (×3): 0.5 mg via INTRAVENOUS
  Filled 2021-01-06 (×3): qty 0.5

## 2021-01-06 MED ORDER — GADOBUTROL 1 MMOL/ML IV SOLN
8.0000 mL | Freq: Once | INTRAVENOUS | Status: AC | PRN
  Administered 2021-01-06: 8 mL via INTRAVENOUS

## 2021-01-06 MED ORDER — SUMATRIPTAN SUCCINATE 100 MG PO TABS: 100.0000 mg | ORAL_TABLET | ORAL | Status: DC | PRN

## 2021-01-06 NOTE — Telephone Encounter (Signed)
Please advise, MRI scheduled for 01/13/21, and is scheduled for followup on 01/15/21 with you to review the MRI.

## 2021-01-06 NOTE — Progress Notes (Signed)
PROGRESS NOTE    Antonio Oliver  PNT:[id number] DOB: 12/12/1955 DOA: 01/05/2021 PCP: Cher Nakai, MD    Chief Complaint  Patient presents with  . Leg Pain    Brief admission narrative:  As per H&P written by Dr. Marylyn Ishihara 01/05/2021 JAAMAL Oliver is a 65 y.o. male with medical history significant of DM2, HTN, HLD, neck surgery. Presenting with back pain and right lower extremity weakness. He reports that he woke up 5 days ago and could not walk. He went to an [facility] where he had a w/u that ruled out stroke. However, he was unable to get an MRI d/t a pacer. It was recommended that he follow up with a back specialist. He did regain some strength and ability to mobilize. So he followed up with his previous back specialist (Dr. Louanne Skye) who ordered a MRI of his back to be completed on 5/10. This morning he woke up with worsening back pain and the inability to walk again. The pain was in his lower back and radiating down his right hip into his right leg. It was a burning pain that turned sharp with movement. He tried his percocet, but it didn't help. He spoke with his back team. It was recommended that he come to the ED. He denies any other aggravating or alleviating factors.   ED Course: Attempted to acquire MRI. Only able to do at [facility] d/t his pacer. MRI will not be able to be acquired until tomorrow AM. EDP spoke with on-call ortho. They recommended admission to [facility], get MRI, and reconsult in AM as his back specialist will be there at that time.   Assessment & Plan: 1-low back pain with right lower extremity weakness on pain -Continues present discomfort with physical activity and requiring IV analgesics -MRI with moderate abnormalities on compression -orthopedic service pending to see patient -PT requested -continue PRN analgesics  2-essential hypertension -Continue current antihypertensive agents  3-hyperlipidemia continue statins  4-type 2 diabetes -Continue modified carbohydrate  diet and sliding scale insulin -Follow CBGs and adjust hypoglycemic regimen as required.  5-history of BPH -Continue Proscar -Currently denying urinary retention.  6-anxiety/depression -Stable mood -No hallucinations or suicidal ideation -Continue home psychotropic drugs.   DVT prophylaxis: SCDs Code Status: Partial code; patient declines intubation. Family Communication: No family at bedside. Disposition:   Status is: Observation  Dispo: The patient is from: Home              Anticipated d/c is to: To be determined              Patient currently not medically stable for discharge; still complaining of significant lower back pain and inability to physically freely ambulate.  Orthopedic surgery pending to see him.  Still requiring IV narcotics to control pain.  Significant abnormalities appreciated on MRI.   Difficult to place patient no     Consultants:   Orthopedic surgery   Procedures:  See below for x-ray reports  Antimicrobials:  None  Subjective: Good urine output reported; no chest pain, no nausea, no vomiting, no shortness of breath.  Patient is still reporting significant lower back pain with physical activity.  Partial relief with the use of IV narcotics.  Objective: Vitals:   01/05/21 2055 01/06/21 0624 01/06/21 0735 01/06/21 1535  BP: 127/82 (!) 142/97 (!) 158/97 112/76  Pulse: 67 72 68 71  Resp: 18 14 17 17   Temp: 99.8 F (37.7 C) 98.4 F (36.9 C) 98.6 F (37 C) 98.1 F (  36.7 C)  TempSrc: Oral Oral Oral Oral  SpO2: 96% 96% 95% 94%  Weight:      Height:        Intake/Output Summary (Last 24 hours) at 01/06/2021 1814 Last data filed at 01/06/2021 1700 Gross per 24 hour  Intake 720 ml  Output 3400 ml  Net -2680 ml   Filed Weights   01/05/21 1200  Weight: 90.3 kg    Examination:  General exam: Reporting ongoing significant pain in his lower back with physical activity.  No chest pain, no nausea, no vomiting.  Patient is afebrile. Respiratory  system: Clear to auscultation. Respiratory effort normal.  No using accessory muscles. Cardiovascular system: S1 & S2 heard, RRR.  No rubs or gallops or clicks.  No JVD.  Positive systolic ejection murmur appreciated on exam. Gastrointestinal system: Abdomen is nondistended, soft and nontender. No organomegaly or masses felt. Normal bowel sounds heard. Central nervous system: Alert and oriented. No focal neurological deficits. Extremities: Cyanosis or clubbing. Skin: No petechiae. Psychiatry: Judgement and insight appear normal. Mood & affect appropriate.     Data Reviewed: I have personally reviewed following labs and imaging studies  CBC: Recent Labs  Lab 01/05/21 1233 01/06/21 0414  WBC 8.4 7.5  NEUTROABS 4.9  --   HGB 13.9 13.2  HCT 40.6 39.0  MCV 94.4 92.4  PLT 241 0000000    Basic Metabolic Panel: Recent Labs  Lab 01/05/21 1233 01/06/21 0414  NA 141 139  K 4.5 4.3  CL 108 106  CO2 25 26  GLUCOSE 128* 129*  BUN 12 10  CREATININE 0.89 1.00  CALCIUM 9.4 9.1    GFR: Estimated Creatinine Clearance: 90.4 mL/min (by C-G formula based on SCr of 1 mg/dL).  Liver Function Tests: Recent Labs  Lab 01/06/21 0414  AST 23  ALT 49*  ALKPHOS 60  BILITOT 0.8  PROT 5.4*  ALBUMIN 3.1*    CBG: Recent Labs  Lab 01/05/21 2100 01/06/21 0629 01/06/21 0738 01/06/21 1407 01/06/21 1647  GLUCAP 112* 116* 124* 128* 169*    Recent Results (from the past 240 hour(s))  Resp Panel by RT-PCR (Flu A&B, Covid) Nasopharyngeal Swab     Status: None   Collection Time: 01/05/21  1:35 PM   Specimen: Nasopharyngeal Swab; Nasopharyngeal(NP) swabs in vial transport medium  Result Value Ref Range Status   SARS Coronavirus 2 by RT PCR NEGATIVE NEGATIVE Final    Comment: (NOTE) SARS-CoV-2 target nucleic acids are NOT DETECTED.  The SARS-CoV-2 RNA is generally detectable in upper respiratory specimens during the acute phase of infection. The lowest concentration of SARS-CoV-2 viral  copies this assay can detect is 138 copies/mL. A negative result does not preclude SARS-Cov-2 infection and should not be used as the sole basis for treatment or other patient management decisions. A negative result may occur with  improper specimen collection/handling, submission of specimen other than nasopharyngeal swab, presence of viral mutation(s) within the areas targeted by this assay, and inadequate number of viral copies(<138 copies/mL). A negative result must be combined with clinical observations, patient history, and epidemiological information. The expected result is Negative.  Fact Sheet for Patients:  EntrepreneurPulse.com.au  Fact Sheet for Healthcare Providers:  IncredibleEmployment.be  This test is no t yet approved or cleared by the [place] FDA and  has been authorized for detection and/or diagnosis of SARS-CoV-2 by FDA under an Emergency Use Authorization (EUA). This EUA will remain  in effect (meaning this test can be used) for the  duration of the COVID-19 declaration under Section 564(b)(1) of the Act, 21 U.S.C.section 360bbb-3(b)(1), unless the authorization is terminated  or revoked sooner.       Influenza A by PCR NEGATIVE NEGATIVE Final   Influenza B by PCR NEGATIVE NEGATIVE Final    Comment: (NOTE) The Xpert Xpress SARS-CoV-2/FLU/RSV plus assay is intended as an aid in the diagnosis of influenza from Nasopharyngeal swab specimens and should not be used as a sole basis for treatment. Nasal washings and aspirates are unacceptable for Xpert Xpress SARS-CoV-2/FLU/RSV testing.  Fact Sheet for Patients: EntrepreneurPulse.com.au  Fact Sheet for Healthcare Providers: IncredibleEmployment.be  This test is not yet approved or cleared by the [place] FDA and has been authorized for detection and/or diagnosis of SARS-CoV-2 by FDA under an Emergency Use Authorization (EUA). This  EUA will remain in effect (meaning this test can be used) for the duration of the COVID-19 declaration under Section 564(b)(1) of the Act, 21 U.S.C. section 360bbb-3(b)(1), unless the authorization is terminated or revoked.  Performed at [facility], [place] [address] [place], [place] [address]      Radiology Studies: MR Lumbar Spine W Wo Contrast  Result Date: 01/06/2021 CLINICAL DATA:  Progressive low back pain with right leg weakness. History of diabetes and neck surgery. EXAM: MRI LUMBAR SPINE WITHOUT AND WITH CONTRAST TECHNIQUE: Multiplanar and multiecho pulse sequences of the lumbar spine were obtained without and with intravenous contrast. CONTRAST:  67mL GADAVIST GADOBUTROL 1 MMOL/ML IV SOLN COMPARISON:  Lumbar spine radiographs 12/31/2020. Lumbar MRI 04/28/2007. FINDINGS: Segmentation: Conventional anatomy assumed, with the last open disc space designated L5-S1.Concordant with previous imaging. Alignment:  Physiologic. Vertebrae: No worrisome osseous lesion, acute fracture or pars defect. Since the previous MRI, the patient has undergone posterior lumbar and interbody fusion from L4 through S1. The interbody fusion appears solid. There are progressive endplate degenerative changes at L2-3. The visualized sacroiliac joints appear unremarkable. Conus medullaris: Extends to the T12-L1 level and appears normal. No abnormal intradural enhancement. Paraspinal and other soft tissues: No significant paraspinal findings. The urinary bladder is moderately distended. Disc levels: Sagittal images demonstrate no significant disc space findings at T11-12 or T12-L1. L1-2: Disc height and hydration are maintained. Mild bilateral facet hypertrophy contributing to mild left foraminal narrowing. L2-3: Progressive disc bulging with endplate degeneration asymmetric to the right. Mild facet and ligamentous hypertrophy. These factors contribute to mild spinal stenosis and mild right foraminal  narrowing. L3-4: Adjacent segment disease with mild loss of disc height and annular disc bulging. There is advanced bilateral facet and ligamentous hypertrophy with bilateral facet joint effusions and resulting moderate to severe spinal stenosis. There is moderate narrowing of the lateral recesses and mild foraminal narrowing bilaterally. Interspinous process fluid is also present, consistent with [place] disease. L4-5: Status post laminectomy and PLIF. The spinal canal and neural foramina are patent. L5-S1: Status post laminectomy and PLIF. The spinal canal and neural foramina are patent. IMPRESSION: 1. Since previous MRI of 2008, the patient has undergone laminectomy and PLIF from L4 through S1. The spinal canal and neural foramina are well decompressed at the operative levels, and the interbody fusion appears solid. 2. Interval development of adjacent segment disease at L3-4 with advanced bilateral facet hypertrophy contributing to moderate to severe multifactorial spinal stenosis. 3. Progressive disc and endplate degeneration asymmetric to the right at L2-3 with mild spinal stenosis and mild right foraminal narrowing. 4. Moderate bladder distension without demonstrated cause. Electronically Signed   By: Caryl Comes.D.  On: 01/06/2021 12:59    Scheduled Meds: . aspirin EC  81 mg Oral Daily  . atorvastatin  20 mg Oral q1800  . brexpiprazole  2 mg Oral Daily  . buPROPion  200 mg Oral BID  . busPIRone  20 mg Oral BID  . darifenacin  7.5 mg Oral Daily  . DULoxetine  60 mg Oral BID  . finasteride  5 mg Oral Daily  . folic acid  1 mg Oral Daily  . gabapentin  600-1,200 mg Oral TID  . insulin aspart  0-15 Units Subcutaneous TID WC  . insulin aspart  0-5 Units Subcutaneous QHS  . levothyroxine  125 mcg Oral Q0600  . magnesium oxide  400 mg Oral BID  . metoprolol succinate  25 mg Oral QHS  . mirtazapine  30 mg Oral QHS  . multivitamin with minerals  1 tablet Oral Daily  .  oxyCODONE-acetaminophen  1 tablet Oral TID   And  . oxyCODONE  5 mg Oral TID   Continuous Infusions:   LOS: 0 days    Time spent: 35 minutes   Barton Dubois, MD [facility]   To contact the attending provider between 7A-7P or the covering provider during after hours 7P-7A, please log into the web site [email or web address] and access using universal [place] password for that web site. If you do not have the password, please call the hospital operator.  01/06/2021, 6:14 PM

## 2021-01-07 ENCOUNTER — Telehealth: Payer: Self-pay | Admitting: Specialist

## 2021-01-07 DIAGNOSIS — M5441 Lumbago with sciatica, right side: Secondary | ICD-10-CM | POA: Diagnosis not present

## 2021-01-07 DIAGNOSIS — Z01818 Encounter for other preprocedural examination: Secondary | ICD-10-CM

## 2021-01-07 LAB — GLUCOSE, CAPILLARY
Glucose-Capillary: 165 mg/dL — ABNORMAL HIGH (ref 70–99)
Glucose-Capillary: 177 mg/dL — ABNORMAL HIGH (ref 70–99)
Glucose-Capillary: 138 mg/dL — ABNORMAL HIGH (ref 70–99)

## 2021-01-07 MED ORDER — CHLORHEXIDINE GLUCONATE 4 % EX LIQD
60.0000 mL | Freq: Once | CUTANEOUS | Status: AC
  Administered 2021-01-08: 4 via TOPICAL
  Filled 2021-01-07: qty 15

## 2021-01-07 MED ORDER — POVIDONE-IODINE 10 % EX SWAB: 2.0000 "application " | Freq: Once | CUTANEOUS | Status: DC

## 2021-01-07 MED ORDER — GABAPENTIN 300 MG PO CAPS: 300.0000 mg | ORAL_CAPSULE | ORAL | Status: DC

## 2021-01-07 MED ORDER — SODIUM CHLORIDE 0.9 % IV SOLN: INTRAVENOUS | Status: DC

## 2021-01-07 MED ORDER — OXYCODONE-ACETAMINOPHEN 5-325 MG PO TABS
1.0000 | ORAL_TABLET | Freq: Four times a day (QID) | ORAL | Status: DC
  Administered 2021-01-07 – 2021-01-13 (×22): 1 via ORAL
  Filled 2021-01-07 (×22): qty 1

## 2021-01-07 MED ORDER — CEFAZOLIN SODIUM-DEXTROSE 2-4 GM/100ML-% IV SOLN
2.0000 g | INTRAVENOUS | Status: AC
  Administered 2021-01-08: 2 g via INTRAVENOUS
  Filled 2021-01-07: qty 100

## 2021-01-07 MED ORDER — OXYCODONE HCL 5 MG PO TABS
5.0000 mg | ORAL_TABLET | Freq: Four times a day (QID) | ORAL | Status: DC
  Administered 2021-01-07 – 2021-01-13 (×22): 5 mg via ORAL
  Filled 2021-01-07 (×22): qty 1

## 2021-01-07 NOTE — H&P (View-Only) (Signed)
Reason for Consult:Worsening bilateral leg weakness due to spinal stenosis. Referring Physician: Dr. Mad[place]/Dr. Arbutus Ped Consulting Physician:Christpoher Sievers [facility]  Orthopedic Diagnosis:Superior adjacent level L3-4 severe spinal stenosis with bilateral leg weakness and bladder distension. Fusion L4 to S1 with retained hardware pedicle screws and rods and cages. Fusion about 10 years ago.  Degenerative disc disease L2-3 with mild spinal stenosis.  History of AVR 2018 with indwelling bichamber pacemaker, cardiologist is  Dr. Geraldo Pitter   [name] is an 65 y.o. male with history of cervical stenosis and lumbar spinal stenosis and DDD. Post lumbar fusion surgery in 2015 L4 to S1 2 level TLIF. He reports chronic left leg weakness but only last week started to Experience pain and numbness and weakness into the right leg with numbness into the right knee and anterior right shin. He is weak in standing and walking and was using a cane until Thursday and he has had previous Pacemaker since 2018. Had aortic valve replacment and had changes of the perivalve area and a pacemaker was placed. The last time the pacer was interrogated it was 99.9% unused. He sees Dr. Geraldo Pitter in [place] with  [facility]. Seen in [place] ER last week and then referred to my office, seen on Wednsday 4/27 and MRI was ordered, unfortunately due to pacemaker only [facility] is able to perform MRI and this was scheduled. However due to worsening back and right leg pain and increasing weakness he presented to [facility] ER on Monday. MRI was done  yesterday with study demonstrating severe L3-4 spinal stenosis with bladder distension. He was admitted for pain control, evaluation and treatment. He has been seen by CIR for consideration of inpatient rehab.     Past Medical History:  Diagnosis Date  . Anxiety 10/30/2018  . Ascending aorta dilation ([place]) 03/06/2019  . Benign prostatic hyperplasia without lower urinary tract symptoms  11/17/2016   Last Assessment & Plan:  Stable PSA today.  Formatting of this note might be different from the original. Last Assessment & Plan:  Stable PSA today. Last Assessment & Plan:  Formatting of this note might be different from the original. Stable PSA today.  . Bicuspid aortic valve 12/22/2016  . BPH (benign prostatic hyperplasia)   . Bradycardia 02/01/2019  . Cervical myelopathy ([place]) 11/14/2018  . Charcot's joint of foot 03/05/2019  . Chronic back pain 04/03/2013  . Chronic bilateral low back pain 11/17/2016  . Chronic inflammatory arthritis 11/17/2016   History of positive rheumatoid factor in the past. Denies placement on immunosuppressive therapy  . Clinical depression 04/03/2013   Last Assessment & Plan:  Formatting of this note might be different from the original. Depression is unchanged.  Discussed sleep hygiene. Medication changes per orders. Depression will be reassessed in 3 months Has been having drowsiness since taking clonazepam. Wishes to try Buspar daily. Discussed non-pharmacologic methods to reduce anxiety/depression such as working in [employer] and relaxation  . Compartment syndrome ([place]) 06/20/2013   Last Assessment & Plan:  Denies any problems with right leg since April/May Does have swelling at times if he is on this leg for long periods of time. Wears compression socks regularly and has helped with swelling.  . Complete heart block ([place]) 05/04/2017  . Depression   . Diabetes ([place]) 10/30/2018  . Diabetes mellitus due to underlying condition with unspecified complications ([place]) 12/13/8117  . Diabetes mellitus type 2 in nonobese (HCC)   . DM2 (diabetes mellitus, type 2) ([place]) 04/03/2013   Last Assessment &  Plan:  Formatting of this note might be different from the original. Diabetes is unchanged.  Continue current treatment regimen. Regular aerobic exercise. Diabetes will be reassessed in 3 months. Will check A1c today. Denies any problems with feet or sensation.  . Enthesopathy  of ankle and tarsus 04/04/2013   Last Assessment & Plan:  Continues with swelling after prolonged standing.  Still  Wearing support hose, which helps.  . Essential hypertension 10/30/2018  . Fatigue 10/30/2018  . FHx: migraine headaches 04/04/2013  . Fracture of capitate bone of wrist 02/26/2019  . Fracture of tibia 03/05/2019  . GERD (gastroesophageal reflux disease) 10/30/2018  . Glaucoma 04/03/2013  . HA (headache) 04/03/2013   Last Assessment & Plan:  Formatting of this note might be different from the original. Infrequent, however still having them.  Not sure of the triggers.  Using zomig, which helps immediately.  Marland Kitchen Heart murmur 11/17/2016  . Hemangioma of skin and subcutaneous tissue 07/07/2017  . History of bariatric surgery 04/04/2013  . History of kidney stones 11/17/2016  . Hyperlipidemia 10/30/2018  . Hypertension   . Hypothyroidism 10/30/2018  . Kidney stone 02/01/2019   Last Assessment & Plan:  Renal condition is unchanged. Continue current medications. Renal condition will be reassessed in 3 months. Discussed increasing fluids, and possibility of pain if stone moves or causes obstruction. Told to call urologist or make appointment if problem worsens.  Last Assessment & Plan:  Formatting of this note might be different from the original. Renal condition is unchan  . Lentigo 07/07/2017  . Lesion of plantar nerve 04/04/2013  . Leukocytosis   . Migraine 11/17/2016  . Mixed dyslipidemia 10/30/2018  . Morbid obesity ([place]) 10/27/2011  . Multiple actinic keratoses 07/07/2017  . Multiple benign melanocytic nevi 07/07/2017  . Neck pain 04/04/2013  . Neoplasm of uncertain behavior of skin 07/28/2018  . Nondisplaced fracture of medial cuneiform of left foot, initial encounter for closed fracture 11/16/2018  . Nonrheumatic aortic valve stenosis 02/01/2019  . Osteoarthritis 10/30/2018  . Other seborrheic keratosis 07/07/2017  . Personal history of tobacco use, presenting hazards to health 04/04/2013  .  Presence of permanent cardiac pacemaker   . S/P AVR (aortic valve replacement) 03/10/2017  . Severe single current episode of major depressive disorder, without psychotic features ([place]) 11/17/2016  . Status post bariatric surgery 04/04/2013  . Strain of rotator cuff 04/04/2013  . Surgery, elective   . Syncope 02/14/2019  . Vitamin D deficiency 10/30/2018  . Weakness     Past Surgical History:  Procedure Laterality Date  . ANTERIOR CERVICAL DECOMP/DISCECTOMY FUSION N/A 11/17/2018   Procedure: CERVICAL THREE-CERVICAL FOUR, CERVICAL FOUR-CERVICAL FIVE, CERVICAL FIVE-CERVICAL SIX ANTERIOR CERVICAL DECOMPRESSION/DISCECTOMY FUSION;  Surgeon: Earnie Larsson, MD;  Location: [place];  Service: Neurosurgery;  Laterality: N/A;  . APPLICATION OF WOUND VAC Left 04/05/2019   Procedure: Application Of Wound Vac;  Surgeon: Erle Crocker, MD;  Location: [place];  Service: Orthopedics;  Laterality: Left;  . BACK SURGERY     x4  . FASCIOTOMY Left 04/05/2019   Left leg 2 compartment fasciotomy  . FASCIOTOMY Left 04/05/2019   Procedure: FASCIOTOMY LEFT LOWER LEG;  Surgeon: Erle Crocker, MD;  Location: [place];  Service: Orthopedics;  Laterality: Left;  . FOOT NEUROMA SURGERY    . GASTRIC BYPASS    . HEMATOMA EVACUATION Left 04/05/2019   Procedure: Evacuation Hematoma Left Lower Leg;  Surgeon: Erle Crocker, MD;  Location: [place];  Service: Orthopedics;  Laterality: Left;  .  HEMORRHOID SURGERY     over 30 years ago  . HERNIA REPAIR     LIH umb  . NASAL SINUS SURGERY     x2  . NECK SURGERY      Family History  Problem Relation Age of Onset  . Cancer Mother        breast  . Rheum arthritis Mother   . Diabetes Mother   . Heart disease Father   . Kidney disease Father   . Diabetes Father   . Hypertension Brother   . Diabetes Brother   . Osteoarthritis Brother   . Heart disease Brother   . Hypertension Brother   . Diabetes Brother   . Rheum arthritis Brother   . Depression Daughter   .  Anxiety disorder Daughter     Social History:  reports that he has never smoked. He has never used smokeless tobacco. He reports current alcohol use of about 1.0 standard drink of alcohol per week. He reports that he does not use drugs.  Allergies:  Allergies  Allergen Reactions  . Morphine Itching    Can take with benadry, facial and nose itching     Medications:  Prior to Admission:  Medications Prior to Admission  Medication Sig Dispense Refill Last Dose  . aspirin EC 81 MG tablet Take 81 mg by mouth daily.   01/05/2021 at Unknown time  . atorvastatin (LIPITOR) 20 MG tablet Take 20 mg by mouth daily at 6 PM.    01/04/2021 at Unknown time  . brexpiprazole (REXULTI) 2 MG TABS tablet Take 2 mg by mouth daily.    01/04/2021 at Unknown time  . buPROPion (WELLBUTRIN SR) 200 MG 12 hr tablet Take 200 mg by mouth 2 (two) times daily.   01/05/2021 at Unknown time  . busPIRone (BUSPAR) 10 MG tablet Take 20 mg by mouth 2 (two) times daily.    01/05/2021 at Unknown time  . calcium citrate (CALCITRATE - DOSED IN MG ELEMENTAL CALCIUM) 950 MG tablet Take 200 mg of elemental calcium by mouth 2 (two) times daily.   01/05/2021 at Unknown time  . Cholecalciferol 125 MCG (5000 UT) TABS Take 125 mcg by mouth daily.   01/05/2021 at Unknown time  . clonazePAM (KLONOPIN) 0.5 MG tablet Take 0.5 mg by mouth 2 (two) times daily as needed for anxiety.   Past Month at Unknown time  . DULoxetine (CYMBALTA) 60 MG capsule Take 60 mg by mouth 2 (two) times daily.    01/05/2021 at Unknown time  . fexofenadine (ALLEGRA) 180 MG tablet Take 180 mg by mouth daily as needed for allergies.   Past Month at Unknown time  . finasteride (PROSCAR) 5 MG tablet Take 5 mg by mouth daily.   01/05/2021 at Unknown time  . fluticasone (FLONASE) 50 MCG/ACT nasal spray Place 2 sprays into the nose daily as needed for allergies or rhinitis.   Past Month at Unknown time  . folic acid (FOLVITE) 1 MG tablet Take 1 mg by mouth daily.   01/05/2021 at Unknown time   . furosemide (LASIX) 20 MG tablet Take 1 tablet (20 mg total) by mouth as needed (for leg swelling). (Patient taking differently: Take 20 mg by mouth daily as needed for fluid.) 30 tablet 4 Past Month at Unknown time  . gabapentin (NEURONTIN) 600 MG tablet Take 600-1,200 mg by mouth 3 (three) times daily.   01/05/2021 at Unknown time  . Galcanezumab-gnlm (EMGALITY) 120 MG/ML SOAJ Inject 120 mg into the skin  every 28 (twenty-eight) days. 1 mL 0 12/05/2020  . levothyroxine (SYNTHROID) 125 MCG tablet Take 125 mcg by mouth daily.    01/05/2021 at Unknown time  . lisinopril (ZESTRIL) 5 MG tablet Take 5 mg daily and as needed for BP greater than 160. (Patient taking differently: Take 5 mg by mouth daily.) 135 tablet 3 01/05/2021 at Unknown time  . Magnesium 500 MG CAPS Take 500 mg by mouth 2 (two) times a day.    01/05/2021 at Unknown time  . metFORMIN (GLUCOPHAGE) 1000 MG tablet Take 1,000 mg by mouth 2 (two) times daily with a meal.   01/05/2021 at Unknown time  . metoprolol succinate (TOPROL-XL) 50 MG 24 hr tablet Take 25 mg by mouth at bedtime. Take with or immediately following a meal.   01/04/2021 at 10 pm  . mirtazapine (REMERON) 30 MG tablet Take 30 mg by mouth at bedtime.    01/04/2021 at Unknown time  . Multiple Vitamins-Minerals (MULTIVITAMIN WITH MINERALS) tablet Take 1 tablet by mouth daily.   01/05/2021 at Unknown time  . mupirocin ointment (BACTROBAN) 2 % Apply 1 application topically 3 (three) times daily as needed (infection).   01/03/2021  . omeprazole (PRILOSEC) 20 MG capsule Take 20 mg by mouth 2 (two) times daily before a meal.    01/05/2021 at Unknown time  . oxyCODONE-acetaminophen (PERCOCET) 10-325 MG tablet Take 1 tablet by mouth 3 (three) times daily.   01/05/2021 at Unknown time  . potassium chloride SA (KLOR-CON) 20 MEQ tablet Take 20 mEq by mouth daily as needed (potassium defficiency). With lasix   Past Month at Unknown time  . Semaglutide (OZEMPIC, 0.25 OR 0.5 MG/DOSE, [place]) Inject 0.5 mg into the  skin once a week.   12/29/2020  . solifenacin (VESICARE) 10 MG tablet Take 10 mg by mouth daily.   01/05/2021 at Unknown time  . testosterone cypionate (DEPOTESTOSTERONE CYPIONATE) 200 MG/ML injection Inject 200 mg into the muscle every 30 (thirty) days.   11/04/2020  . tiZANidine (ZANAFLEX) 4 MG capsule Take 4-8 mg by mouth 3 (three) times daily as needed for muscle spasms.   01/05/2021 at Unknown time  . traMADol (ULTRAM) 50 MG tablet Take 50 mg by mouth 3 (three) times daily.   01/04/2021 at Unknown time  . Ubrogepant 100 MG TABS Take 100 mg by mouth daily as needed for migraine.   01/02/2021  . COVID-19 mRNA vaccine, Pfizer, 30 MCG/0.3ML injection USE AS DIRECTED .3 mL 0   . diclofenac Sodium (VOLTAREN) 1 % GEL Apply 1 application topically 4 (four) times daily.      Scheduled: . aspirin EC  81 mg Oral Daily  . atorvastatin  20 mg Oral q1800  . brexpiprazole  2 mg Oral Daily  . buPROPion  200 mg Oral BID  . busPIRone  20 mg Oral BID  . darifenacin  7.5 mg Oral Daily  . DULoxetine  60 mg Oral BID  . finasteride  5 mg Oral Daily  . folic acid  1 mg Oral Daily  . gabapentin  600-1,200 mg Oral TID  . insulin aspart  0-15 Units Subcutaneous TID WC  . insulin aspart  0-5 Units Subcutaneous QHS  . levothyroxine  125 mcg Oral Q0600  . magnesium oxide  400 mg Oral BID  . metoprolol succinate  25 mg Oral QHS  . mirtazapine  30 mg Oral QHS  . multivitamin with minerals  1 tablet Oral Daily  . oxyCODONE-acetaminophen  1 tablet Oral Q6H  And  . oxyCODONE  5 mg Oral Q6H   Continuous:   Results for orders placed or performed during the hospital encounter of 01/05/21 (from the past 48 hour(s))  Glucose, capillary     Status: Abnormal   Collection Time: 01/05/21  9:00 PM  Result Value Ref Range   Glucose-Capillary 112 (H) 70 - 99 mg/dL    Comment: Glucose reference range applies only to samples taken after fasting for at least 8 hours.  Comprehensive metabolic panel     Status: Abnormal    Collection Time: 01/06/21  4:14 AM  Result Value Ref Range   Sodium 139 135 - 145 mmol/L   Potassium 4.3 3.5 - 5.1 mmol/L   Chloride 106 98 - 111 mmol/L   CO2 26 22 - 32 mmol/L   Glucose, Bld 129 (H) 70 - 99 mg/dL    Comment: Glucose reference range applies only to samples taken after fasting for at least 8 hours.   BUN 10 8 - 23 mg/dL   Creatinine, Ser 1.00 0.61 - 1.24 mg/dL   Calcium 9.1 8.9 - 10.3 mg/dL   Total Protein 5.4 (L) 6.5 - 8.1 g/dL   Albumin 3.1 (L) 3.5 - 5.0 g/dL   AST 23 15 - 41 U/L   ALT 49 (H) 0 - 44 U/L   Alkaline Phosphatase 60 38 - 126 U/L   Total Bilirubin 0.8 0.3 - 1.2 mg/dL   GFR, Estimated >60 >60 mL/min    Comment: (NOTE) Calculated using the CKD-EPI Creatinine Equation (2021)    Anion gap 7 5 - 15    Comment: Performed at [facility], [place] [address], [place] [address]  CBC     Status: None   Collection Time: 01/06/21  4:14 AM  Result Value Ref Range   WBC 7.5 4.0 - 10.5 K/uL   RBC 4.22 4.22 - 5.81 MIL/uL   Hemoglobin 13.2 13.0 - 17.0 g/dL   HCT 39.0 39.0 - 52.0 %   MCV 92.4 80.0 - 100.0 fL   MCH 31.3 26.0 - 34.0 pg   MCHC 33.8 30.0 - 36.0 g/dL   RDW 11.9 11.5 - 15.5 %   Platelets 188 150 - 400 K/uL   nRBC 0.0 0.0 - 0.2 %    Comment: Performed at [facility], [place] [address], [place], [place] [address]  Glucose, capillary     Status: Abnormal   Collection Time: 01/06/21  6:29 AM  Result Value Ref Range   Glucose-Capillary 116 (H) 70 - 99 mg/dL    Comment: Glucose reference range applies only to samples taken after fasting for at least 8 hours.  Glucose, capillary     Status: Abnormal   Collection Time: 01/06/21  7:38 AM  Result Value Ref Range   Glucose-Capillary 124 (H) 70 - 99 mg/dL    Comment: Glucose reference range applies only to samples taken after fasting for at least 8 hours.  Glucose, capillary     Status: Abnormal   Collection Time: 01/06/21  2:07 PM  Result Value Ref Range   Glucose-Capillary 128 (H) 70 - 99  mg/dL    Comment: Glucose reference range applies only to samples taken after fasting for at least 8 hours.  Glucose, capillary     Status: Abnormal   Collection Time: 01/06/21  4:47 PM  Result Value Ref Range   Glucose-Capillary 169 (H) 70 - 99 mg/dL    Comment: Glucose reference range applies only to  samples taken after fasting for at least 8 hours.  Glucose, capillary     Status: Abnormal   Collection Time: 01/06/21  9:30 PM  Result Value Ref Range   Glucose-Capillary 166 (H) 70 - 99 mg/dL    Comment: Glucose reference range applies only to samples taken after fasting for at least 8 hours.  Glucose, capillary     Status: Abnormal   Collection Time: 01/07/21 11:30 AM  Result Value Ref Range   Glucose-Capillary 165 (H) 70 - 99 mg/dL    Comment: Glucose reference range applies only to samples taken after fasting for at least 8 hours.  Glucose, capillary     Status: Abnormal   Collection Time: 01/07/21  4:11 PM  Result Value Ref Range   Glucose-Capillary 177 (H) 70 - 99 mg/dL    Comment: Glucose reference range applies only to samples taken after fasting for at least 8 hours.    MR Lumbar Spine W Wo Contrast  Result Date: 01/06/2021 CLINICAL DATA:  Progressive low back pain with right leg weakness. History of diabetes and neck surgery. EXAM: MRI LUMBAR SPINE WITHOUT AND WITH CONTRAST TECHNIQUE: Multiplanar and multiecho pulse sequences of the lumbar spine were obtained without and with intravenous contrast. CONTRAST:  71mL GADAVIST GADOBUTROL 1 MMOL/ML IV SOLN COMPARISON:  Lumbar spine radiographs 12/31/2020. Lumbar MRI 04/28/2007. FINDINGS: Segmentation: Conventional anatomy assumed, with the last open disc space designated L5-S1.Concordant with previous imaging. Alignment:  Physiologic. Vertebrae: No worrisome osseous lesion, acute fracture or pars defect. Since the previous MRI, the patient has undergone posterior lumbar and interbody fusion from L4 through S1. The interbody fusion appears  solid. There are progressive endplate degenerative changes at L2-3. The visualized sacroiliac joints appear unremarkable. Conus medullaris: Extends to the T12-L1 level and appears normal. No abnormal intradural enhancement. Paraspinal and other soft tissues: No significant paraspinal findings. The urinary bladder is moderately distended. Disc levels: Sagittal images demonstrate no significant disc space findings at T11-12 or T12-L1. L1-2: Disc height and hydration are maintained. Mild bilateral facet hypertrophy contributing to mild left foraminal narrowing. L2-3: Progressive disc bulging with endplate degeneration asymmetric to the right. Mild facet and ligamentous hypertrophy. These factors contribute to mild spinal stenosis and mild right foraminal narrowing. L3-4: Adjacent segment disease with mild loss of disc height and annular disc bulging. There is advanced bilateral facet and ligamentous hypertrophy with bilateral facet joint effusions and resulting moderate to severe spinal stenosis. There is moderate narrowing of the lateral recesses and mild foraminal narrowing bilaterally. Interspinous process fluid is also present, consistent with [place] disease. L4-5: Status post laminectomy and PLIF. The spinal canal and neural foramina are patent. L5-S1: Status post laminectomy and PLIF. The spinal canal and neural foramina are patent. IMPRESSION: 1. Since previous MRI of 2008, the patient has undergone laminectomy and PLIF from L4 through S1. The spinal canal and neural foramina are well decompressed at the operative levels, and the interbody fusion appears solid. 2. Interval development of adjacent segment disease at L3-4 with advanced bilateral facet hypertrophy contributing to moderate to severe multifactorial spinal stenosis. 3. Progressive disc and endplate degeneration asymmetric to the right at L2-3 with mild spinal stenosis and mild right foraminal narrowing. 4. Moderate bladder distension without  demonstrated cause. Electronically Signed   By: Richardean Sale M.D.   On: 01/06/2021 12:59   Aortic Valve Replacement 03/10/2017 1. 03/10/2017 S/P Aortic Valve Replacement with a 27 mm [facility] Pericardial Tissue Valve  serial # [id number] Pacemaker  Placement 03/15/2017 2. 03/15/2017 S/P Dual Chamber Pacemaker (St. Jude) secondary to Complete Heart Block  Cardiologist: Jyl Heinz, MD, [facility] [place], apparently has had recent ECHO and interegation of pacemaker with extremely little activity.   ROS Blood pressure (!) 149/83, pulse 69, temperature 98.2 F (36.8 C), temperature source Oral, resp. rate 16, height 6\' 3"  (1.905 m), weight 90.3 kg, SpO2 100 %. Physical Exam  Orthopaedic Exam: In bedside recliner. Right leg with numerous bandaids for abrasions, he has had falls and is contusing the anterior legs and calfs. Pain is mainly back with radiation into the right leg more than left with twists or standing and extending back. Has a [place] catheter in place. Motor in the lower extremities shows Quadriceps weakness 4/5 and bilateral foot dorsiflexion weakness that is 3+/5. Plantar flexion is intact. SLR is negative. Reverse SLR is positive on the right side. MRI as above with moderately severe to severe spinal stenosis findings at the L3-4 level above an L4 to S1 fusion. There is effusion of the facets at L3-4. Soft tissue edema between the spinous processes of L3 and L4 consistent with interspinous process impingement and arthrosis. Mild spinal stenosis changes at L2-3 level with disc degeneration that is moderate. No anterolisthesis noted. Fusion appears solid.  Assessment/Plan: Superior adjacent level L3-4 severe spinal stenosis with bilateral leg weakness and bladder distension. Fusion L4 to S1 with retained hardware pedicle screws and rods and cages. Fusion about 10 years ago.  Degenerative disc disease L2-3 with mild spinal stenosis.  History of AVR 2018 with  indwelling bichamber pacemaker, cardiologist is  Dr. Geraldo Pitter  Plan: I will schedule Mr. Mule to undergo an L2-3, L3-4 decompressive central laminectomy for severe lumbar spinal stenosis above the L4 to S1 fusion. This will relieve nerve compression and should result in improvement in leg pain, and neurogenic claudication symptoms. The difficulty with voiding may also improve. The risks of surgery include risks of infection 1 in 200 due to diabetes, risks related to heart which will be assessed by Cardiology. ( Dr. Nori Riis on call for cardiology [facility] has been contacted and will see for preop clearance) risk of bleeding is  Minimal as I plan only a decompressive laminectomy without fusion.  I will schedule for Thursday late afternoon 01/08/2021. Hold all anticoagulants pre op .  [name] 01/07/2021, 7:48 PM

## 2021-01-07 NOTE — Progress Notes (Signed)
Rehab Admissions Coordinator Note:  Patient was screened by Cleatrice Burke for appropriateness for an Inpatient Acute Rehab Consult per PT recs. Medical workup in progress. I will not place rehab consult until more definitive diagnosis and plan outlined. We will follow.  Danne Baxter, RN, MSN Rehab Admissions Coordinator [phone] 01/07/2021 5:02 PM  .

## 2021-01-07 NOTE — Hospital Course (Addendum)
As per H&P written by Dr. Marylyn Ishihara 01/05/2021 "Antonio Oliver is a 65 y.o. male with medical history significant of DM2, HTN, HLD, neck surgery. Presenting with back pain and right lower extremity weakness. He reports that he woke up 5 days ago and could not walk. He went to an [facility] where he had a w/u that ruled out stroke. However, he was unable to get an MRI d/t a pacer. It was recommended that he follow up with a back specialist. He did regain some strength and ability to mobilize. So he followed up with his previous back specialist (Dr. Louanne Skye) who ordered a MRI of his back to be completed on 5/10. This morning he woke up with worsening back pain and the inability to walk again. The pain was in his lower back and radiating down his right hip into his right leg. It was a burning pain that turned sharp with movement. He tried his percocet, but it didn't help. He spoke with his back team. It was recommended that he come to the ED. He denies any other aggravating or alleviating factors. "  Patient transferred to [facility] due to need for MRI with pacemaker present, and to be seen and evaluated by his back surgeon as well.  MRI Lumbar Spine IMPRESSION: 1. Since previous MRI of 2008, the patient has undergone laminectomy and PLIF from L4 through S1. The spinal canal and neural foramina are well decompressed at the operative levels, and the interbody fusion appears solid. 2. Interval development of adjacent segment disease at L3-4 with advanced bilateral facet hypertrophy contributing to moderate to severe multifactorial spinal stenosis. 3. Progressive disc and endplate degeneration asymmetric to the right at L2-3 with mild spinal stenosis and mild right foraminal narrowing. 4. Moderate bladder distension without demonstrated cause.

## 2021-01-07 NOTE — Progress Notes (Signed)
PROGRESS NOTE    DANISH RUFFINS   CBJ:[id number]  DOB: 1956-07-14  PCP: Antonio Nakai, MD    DOA: 01/05/2021 LOS: 0   Brief Narrative   As per H&P written by Dr. Marylyn Ishihara 01/05/2021 "IBROHIM Oliver is a 65 y.o. male with medical history significant of DM2, HTN, HLD, neck surgery. Presenting with back pain and right lower extremity weakness. He reports that he woke up 5 days ago and could not walk. He went to an [facility] where he had a w/u that ruled out stroke. However, he was unable to get an MRI d/t a pacer. It was recommended that he follow up with a back specialist. He did regain some strength and ability to mobilize. So he followed up with his previous back specialist (Dr. Louanne Skye) who ordered a MRI of his back to be completed on 5/10. This morning he woke up with worsening back pain and the inability to walk again. The pain was in his lower back and radiating down his right hip into his right leg. It was a burning pain that turned sharp with movement. He tried his percocet, but it didn't help. He spoke with his back team. It was recommended that he come to the ED. He denies any other aggravating or alleviating factors. "  Patient transferred to [facility] due to need for MRI with pacemaker present, and to be seen and evaluated by his back surgeon as well.  MRI Lumbar Spine IMPRESSION: 1. Since previous MRI of 2008, the patient has undergone laminectomy and PLIF from L4 through S1. The spinal canal and neural foramina are well decompressed at the operative levels, and the interbody fusion appears solid. 2. Interval development of adjacent segment disease at L3-4 with advanced bilateral facet hypertrophy contributing to moderate to severe multifactorial spinal stenosis. 3. Progressive disc and endplate degeneration asymmetric to the right at L2-3 with mild spinal stenosis and mild right foraminal narrowing. 4. Moderate bladder distension without demonstrated cause.     Assessment & Plan    Active Problems:   Back pain   Low back pain with right lower extremity radiculopathy Hx of prior back surgery, now with acute worsening several days ago.  5/4 has ongoing severe pain.   MRI lumbar spine showed  --Ortho consulted, notification to Dr. Louanne Skye via on call PA --Pain control per orders --Continue gabapentin and Cymbalta, Zalaflex --PT and OT as able  Essential hypertension - chronic, intermittently high likely due to pain.  Continue metoprolol.  Hyperlipidemia - continue atorvastatin  Type 2 diabetes - sliding scale Novolog  History of BPH -  Continue Proscar.  No reports of acute retention.   Anxiety/depression/Insomnia- stable.  Continue Wellbutrin, Rexulti, Klonopin PRN, Cymbalta, Remeron  Hx of migraines - continue Imitrex PRN  Hypothyroidism - continue levothyroxine  Patient BMI: Body mass index is 24.87 kg/m.   DVT prophylaxis: SCDs Start: 01/05/21 1602   Diet:  Diet Orders (From admission, onward)    Start     Ordered   01/05/21 1602  Diet Carb Modified Fluid consistency: Thin; Room service appropriate? Yes  Diet effective now       Question Answer Comment  Diet-HS Snack? Nothing   Calorie Level Medium 1600-2000   Fluid consistency: Thin   Room service appropriate? Yes      01/05/21 1601            Code Status: Partial Code    Subjective 01/07/21    Pt seated on bedside commode,  wife at bedside.  He reports 9 out of 10 back pain with radiation down right leg at times.  Intermittent R leg weakness.  Asks for pain control to be better.  No fever/chills or other acute complaints.  Denies any trouble with bowel or bladder control or retention.    Disposition Plan & Communication   Status is: Observation  Observation status appropriate as it is expected pt stable for d/c within two midnights.  Awaiting ortho consult at this time.  Dispo: The patient is from: home              Anticipated d/c is to: TBD              Patient currently  not medically stable for d/c.   Difficult to place patient - not likely   Family Communication: wife at bedside on rounds today    Consults, Procedures, Significant Events   Consultants:   Orthopedics, Dr. Louanne Skye  Procedures:   none  Antimicrobials:  Anti-infectives (From admission, onward)   None        Micro    Objective   Vitals:   01/06/21 2120 01/07/21 0548 01/07/21 0914 01/07/21 1428  BP: 122/82 (!) 155/96 (!) 142/95 115/64  Pulse: 70 67 66 77  Resp: 18 18 17 17   Temp: 98.4 F (36.9 C) 98 F (36.7 C) 98.7 F (37.1 C) 98.3 F (36.8 C)  TempSrc: Oral Oral Oral Oral  SpO2: 96% 97% 96% 96%  Weight:      Height:        Intake/Output Summary (Last 24 hours) at 01/07/2021 1755 Last data filed at 01/07/2021 0600 Gross per 24 hour  Intake --  Output 1500 ml  Net -1500 ml   Filed Weights   01/05/21 1200  Weight: 90.3 kg    Physical Exam:  General exam: awake, alert, no acute distress HEENT: atraumatic, clear conjunctiva, anicteric sclera, moist mucus membranes, hearing grossly normal  Respiratory system: CTAB, no wheezes, rales or rhonchi, normal respiratory effort. Cardiovascular system: normal S1/S2, RRR, no pedal edema.   Central nervous system: A&O x3. no gross focal neurologic deficits, normal speech Extremities: moves all, no edema, normal tone Skin: dry, intact, normal temperature, normal color, no rashes seen, midline lower lumbar prior surgical incision Psychiatry: normal mood, congruent affect, judgement and insight appear normal  Labs   Data Reviewed: I have personally reviewed following labs and imaging studies  CBC: Recent Labs  Lab 01/05/21 1233 01/06/21 0414  WBC 8.4 7.5  NEUTROABS 4.9  --   HGB 13.9 13.2  HCT 40.6 39.0  MCV 94.4 92.4  PLT 241 998   Basic Metabolic Panel: Recent Labs  Lab 01/05/21 1233 01/06/21 0414  NA 141 139  K 4.5 4.3  CL 108 106  CO2 25 26  GLUCOSE 128* 129*  BUN 12 10  CREATININE 0.89 1.00   CALCIUM 9.4 9.1   GFR: Estimated Creatinine Clearance: 90.4 mL/min (by C-G formula based on SCr of 1 mg/dL). Liver Function Tests: Recent Labs  Lab 01/06/21 0414  AST 23  ALT 49*  ALKPHOS 60  BILITOT 0.8  PROT 5.4*  ALBUMIN 3.1*   No results for input(s): LIPASE, AMYLASE in the last 168 hours. No results for input(s): AMMONIA in the last 168 hours. Coagulation Profile: No results for input(s): INR, PROTIME in the last 168 hours. Cardiac Enzymes: No results for input(s): CKTOTAL, CKMB, CKMBINDEX, TROPONINI in the last 168 hours. BNP (last 3 results) No results  for input(s): PROBNP in the last 8760 hours. HbA1C: Recent Labs    01/05/21 1622  HGBA1C 6.2*   CBG: Recent Labs  Lab 01/06/21 1407 01/06/21 1647 01/06/21 2130 01/07/21 1130 01/07/21 1611  GLUCAP 128* 169* 166* 165* 177*   Lipid Profile: No results for input(s): CHOL, HDL, LDLCALC, TRIG, CHOLHDL, LDLDIRECT in the last 72 hours. Thyroid Function Tests: No results for input(s): TSH, T4TOTAL, FREET4, T3FREE, THYROIDAB in the last 72 hours. Anemia Panel: No results for input(s): VITAMINB12, FOLATE, FERRITIN, TIBC, IRON, RETICCTPCT in the last 72 hours. Sepsis Labs: No results for input(s): PROCALCITON, LATICACIDVEN in the last 168 hours.  Recent Results (from the past 240 hour(s))  Resp Panel by RT-PCR (Flu A&B, Covid) Nasopharyngeal Swab     Status: None   Collection Time: 01/05/21  1:35 PM   Specimen: Nasopharyngeal Swab; Nasopharyngeal(NP) swabs in vial transport medium  Result Value Ref Range Status   SARS Coronavirus 2 by RT PCR NEGATIVE NEGATIVE Final    Comment: (NOTE) SARS-CoV-2 target nucleic acids are NOT DETECTED.  The SARS-CoV-2 RNA is generally detectable in upper respiratory specimens during the acute phase of infection. The lowest concentration of SARS-CoV-2 viral copies this assay can detect is 138 copies/mL. A negative result does not preclude SARS-Cov-2 infection and should not be used  as the sole basis for treatment or other patient management decisions. A negative result may occur with  improper specimen collection/handling, submission of specimen other than nasopharyngeal swab, presence of viral mutation(s) within the areas targeted by this assay, and inadequate number of viral copies(<138 copies/mL). A negative result must be combined with clinical observations, patient history, and epidemiological information. The expected result is Negative.  Fact Sheet for Patients:  EntrepreneurPulse.com.au  Fact Sheet for Healthcare Providers:  IncredibleEmployment.be  This test is no t yet approved or cleared by the [place] FDA and  has been authorized for detection and/or diagnosis of SARS-CoV-2 by FDA under an Emergency Use Authorization (EUA). This EUA will remain  in effect (meaning this test can be used) for the duration of the COVID-19 declaration under Section 564(b)(1) of the Act, 21 U.S.C.section 360bbb-3(b)(1), unless the authorization is terminated  or revoked sooner.       Influenza A by PCR NEGATIVE NEGATIVE Final   Influenza B by PCR NEGATIVE NEGATIVE Final    Comment: (NOTE) The Xpert Xpress SARS-CoV-2/FLU/RSV plus assay is intended as an aid in the diagnosis of influenza from Nasopharyngeal swab specimens and should not be used as a sole basis for treatment. Nasal washings and aspirates are unacceptable for Xpert Xpress SARS-CoV-2/FLU/RSV testing.  Fact Sheet for Patients: EntrepreneurPulse.com.au  Fact Sheet for Healthcare Providers: IncredibleEmployment.be  This test is not yet approved or cleared by the [place] FDA and has been authorized for detection and/or diagnosis of SARS-CoV-2 by FDA under an Emergency Use Authorization (EUA). This EUA will remain in effect (meaning this test can be used) for the duration of the COVID-19 declaration under Section 564(b)(1)  of the Act, 21 U.S.C. section 360bbb-3(b)(1), unless the authorization is terminated or revoked.  Performed at [facility], [place] [address] [place], [place] [address]       Imaging Studies   MR Lumbar Spine W Wo Contrast  Result Date: 01/06/2021 CLINICAL DATA:  Progressive low back pain with right leg weakness. History of diabetes and neck surgery. EXAM: MRI LUMBAR SPINE WITHOUT AND WITH CONTRAST TECHNIQUE: Multiplanar and multiecho pulse sequences of the lumbar spine were obtained  without and with intravenous contrast. CONTRAST:  56mL GADAVIST GADOBUTROL 1 MMOL/ML IV SOLN COMPARISON:  Lumbar spine radiographs 12/31/2020. Lumbar MRI 04/28/2007. FINDINGS: Segmentation: Conventional anatomy assumed, with the last open disc space designated L5-S1.Concordant with previous imaging. Alignment:  Physiologic. Vertebrae: No worrisome osseous lesion, acute fracture or pars defect. Since the previous MRI, the patient has undergone posterior lumbar and interbody fusion from L4 through S1. The interbody fusion appears solid. There are progressive endplate degenerative changes at L2-3. The visualized sacroiliac joints appear unremarkable. Conus medullaris: Extends to the T12-L1 level and appears normal. No abnormal intradural enhancement. Paraspinal and other soft tissues: No significant paraspinal findings. The urinary bladder is moderately distended. Disc levels: Sagittal images demonstrate no significant disc space findings at T11-12 or T12-L1. L1-2: Disc height and hydration are maintained. Mild bilateral facet hypertrophy contributing to mild left foraminal narrowing. L2-3: Progressive disc bulging with endplate degeneration asymmetric to the right. Mild facet and ligamentous hypertrophy. These factors contribute to mild spinal stenosis and mild right foraminal narrowing. L3-4: Adjacent segment disease with mild loss of disc height and annular disc bulging. There is advanced bilateral facet  and ligamentous hypertrophy with bilateral facet joint effusions and resulting moderate to severe spinal stenosis. There is moderate narrowing of the lateral recesses and mild foraminal narrowing bilaterally. Interspinous process fluid is also present, consistent with Baastrup disease. L4-5: Status post laminectomy and PLIF. The spinal canal and neural foramina are patent. L5-S1: Status post laminectomy and PLIF. The spinal canal and neural foramina are patent. IMPRESSION: 1. Since previous MRI of 2008, the patient has undergone laminectomy and PLIF from L4 through S1. The spinal canal and neural foramina are well decompressed at the operative levels, and the interbody fusion appears solid. 2. Interval development of adjacent segment disease at L3-4 with advanced bilateral facet hypertrophy contributing to moderate to severe multifactorial spinal stenosis. 3. Progressive disc and endplate degeneration asymmetric to the right at L2-3 with mild spinal stenosis and mild right foraminal narrowing. 4. Moderate bladder distension without demonstrated cause. Electronically Signed   By: Richardean Sale M.D.   On: 01/06/2021 12:59     Medications   Scheduled Meds: . aspirin EC  81 mg Oral Daily  . atorvastatin  20 mg Oral q1800  . brexpiprazole  2 mg Oral Daily  . buPROPion  200 mg Oral BID  . busPIRone  20 mg Oral BID  . darifenacin  7.5 mg Oral Daily  . DULoxetine  60 mg Oral BID  . finasteride  5 mg Oral Daily  . folic acid  1 mg Oral Daily  . gabapentin  600-1,200 mg Oral TID  . insulin aspart  0-15 Units Subcutaneous TID WC  . insulin aspart  0-5 Units Subcutaneous QHS  . levothyroxine  125 mcg Oral Q0600  . magnesium oxide  400 mg Oral BID  . metoprolol succinate  25 mg Oral QHS  . mirtazapine  30 mg Oral QHS  . multivitamin with minerals  1 tablet Oral Daily  . oxyCODONE-acetaminophen  1 tablet Oral Q6H   And  . oxyCODONE  5 mg Oral Q6H   Continuous Infusions:     LOS: 0 days    Time  spent: 30 minutes    Ezekiel Slocumb, DO [facility]  01/07/2021, 5:55 PM      If 7PM-7AM, please contact night-coverage. How to contact the [facility] Attending or Consulting provider [place] or covering provider during after hours [place], for this patient?  1. Check the care team in [facility] and look for a) attending/consulting [facility] provider listed and b) the [facility] team listed 2. Log into [email or web address] and use [place]'s universal password to access. If you do not have the password, please contact the hospital operator. 3. Locate the [facility] provider you are looking for under [facility] and page to a number that you can be directly reached. 4. If you still have difficulty reaching the provider, please page the [facility] (Director on Call) for the Hospitalists listed on amion for assistance.

## 2021-01-07 NOTE — Evaluation (Signed)
Physical Therapy Evaluation Patient Details Name: Antonio Oliver MRN: [id number] DOB: [birth year] Today's Date: 01/07/2021   History of Present Illness  65 y.o. male admitted under observation status on 01/05/21 for low back pain with R LE weakness, MRI completed and awaiting ortho consult (had Dr. Deno Etienne before for lumbar spine surgery).  Pt with other significant PMH of AVR, bariatric surgery, L foot fx (cuneiform), HTN, glaucoma, fx of the L tibia s/p IM nail, complete heart block, charcot's deformity of the foot L, anxiety, neck surgery x 2, back surgery x 4.  Clinical Impression  Pt with significant change in mobility compared to baseline.  We stood from the recliner chair with two person assist and RW and were only able to step forward and back in place.  Pt was very fearful of legs buckling, particularly his right leg, but left leg has significant weakness as well.  He currently presents at a level that would require WC level mobility to be safe going home and full time care from his wife.  She reports she could take some days off, but not indefinitely as she essentially works full time.  Per wife, there are no friends or family that could pitch in to help.   Follow Up Recommendations CIR (depending on ortho MD consult/plan)    Equipment Recommendations  3in1 (PT);Hospital bed    Recommendations for Other Services OT consult     Precautions / Restrictions Precautions Precautions: Fall Precaution Comments: pt reports history of falls and stumbles at home Restrictions Weight Bearing Restrictions: No      Mobility  Bed Mobility               General bed mobility comments: Pt was OOB in the recliner chair, reports his wife picked him up and put him over there.    Transfers Overall transfer level: Needs assistance Equipment used: Rolling walker (2 wheeled) Transfers: Sit to/from Stand Sit to Stand: Mod assist;+2 physical assistance         General transfer comment: Two  person mod assist from low recliner chair with R leg blocked.  Wife assisting PT in mobilizing pt up to RW.  Pre gait activities attempted before pain and fatigue set in and pt needed to sit.  Ambulation/Gait                Stairs            Wheelchair Mobility    Modified Rankin (Stroke Patients Only)       Balance Overall balance assessment: Needs assistance Sitting-balance support: Feet supported;No upper extremity supported Sitting balance-Leahy Scale: Good     Standing balance support: Bilateral upper extremity supported Standing balance-Leahy Scale: Poor Standing balance comment: Two person mod assist in standing.  Did some pre gait stepping forward and back with R and L foot.  R foot had more difficulty than L progressing to step forward and back, blocked R knee to step on L side.                             Pertinent Vitals/Pain Pain Assessment: Faces Faces Pain Scale: Hurts whole lot Pain Location: low back into R posterior leg, not past knee Pain Descriptors / Indicators: Burning Pain Intervention(s): Limited activity within patient's tolerance;Monitored during session;Repositioned;Ice applied    Home Living Family/patient expects to be discharged to:: Private residence Living Arrangements: Spouse/significant other (wife works, "nearly full time") Available Help at Discharge:  Family;Available PRN/intermittently Type of Home: House Home Access: Stairs to enter;Ramped entrance Entrance Stairs-Rails: Right;Left (first 4 have 2, but can't reach both, second set has 2 steps and one rail on R) Entrance Stairs-Number of Steps: 6   Home Equipment: Walker - 2 wheels;Cane - single point;Wheelchair - manual      Prior Function Level of Independence: Independent with assistive device(s)         Comments: pt reports walking with a cane full time (in house and in public).  He is a retired Pharmacist, hospital.  Last few days wife has had to pick him up, two  people to get into the car to come to the hospital.     Hand Dominance   Dominant Hand: Right    Extremity/Trunk Assessment   Upper Extremity Assessment Upper Extremity Assessment: Defer to OT evaluation    Lower Extremity Assessment Lower Extremity Assessment: RLE deficits/detail;LLE deficits/detail RLE Deficits / Details: bil legs are weak, right slightly more than left, but not much better on either side (chart review made it sound like it was primarily his right leg, both are extremely weak).  ankle trace DF, knee 2/5, hip flexion 2+/5 sensation decreased to LT on R RLE Sensation: decreased light touch LLE Deficits / Details: left leg is also weak, mildly better than r leg ankle 2+/5, knee 3-/5, hip 2+/5 flexion sensation better on L LLE Sensation: WNL    Cervical / Trunk Assessment Cervical / Trunk Assessment: Other exceptions Cervical / Trunk Exceptions: Pt with notable torticollis in his cervical spine that developed after his first spine surgery and did not get better with a second cervical spine surgery.  Chart review states 4 low back surgeries.  Communication   Communication: No difficulties  Cognition Arousal/Alertness: Awake/alert Behavior During Therapy: WFL for tasks assessed/performed Overall Cognitive Status: Within Functional Limits for tasks assessed                                        General Comments General comments (skin integrity, edema, etc.): Pt and wife are anxious to hear from ortho MD re: MRI results and plan of action.  PT spoke at length re: tehrapy options and I think they wanted to know more about the course of action from the medical team before indicating a preference.    Exercises     Assessment/Plan    PT Assessment Patient needs continued PT services  PT Problem List Decreased strength;Decreased activity tolerance;Decreased balance;Decreased mobility;Decreased knowledge of use of DME;Pain;Impaired sensation       PT  Treatment Interventions DME instruction;Gait training;Stair training;Functional mobility training;Therapeutic activities;Therapeutic exercise;Balance training;Neuromuscular re-education;Patient/family education;Wheelchair mobility training    PT Goals (Current goals can be found in the Care Plan section)  Acute Rehab PT Goals Patient Stated Goal: 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 PT Goal Formulation: With patient Time For Goal Achievement: 01/21/21 Potential to Achieve Goals: Good    Frequency Min 3X/week   Barriers to discharge Decreased caregiver support wife works.    Co-evaluation               AM-PAC PT "6 Clicks" Mobility  Outcome Measure Help needed turning from your back to your side while in a flat bed without using bedrails?: A Lot Help needed moving from lying on your back to sitting on the side of a  flat bed without using bedrails?: A Lot Help needed moving to and from a bed to a chair (including a wheelchair)?: A Lot Help needed standing up from a chair using your arms (e.g., wheelchair or bedside chair)?: A Lot Help needed to walk in hospital room?: Total Help needed climbing 3-5 steps with a railing? : Total 6 Click Score: 10    End of Session Equipment Utilized During Treatment: Gait belt Activity Tolerance: Patient limited by pain;Patient limited by fatigue     PT Visit Diagnosis: Muscle weakness (generalized) (M62.81);Difficulty in walking, not elsewhere classified (R26.2);Other symptoms and signs involving the nervous system (R29.898);Pain Pain - Right/Left: Right Pain - part of body: Leg (and low back)    Time: 1457-1536 (long time spent on PMH) PT Time Calculation (min) (ACUTE ONLY): 39 min   Charges:   PT Evaluation $PT Eval Moderate Complexity: 1 Mod PT Treatments $Therapeutic Activity: 8-22 mins        Verdene Lennert, PT, DPT  Acute Rehabilitation [phone] pager [phone]) [phone]  office

## 2021-01-07 NOTE — Consult Note (Signed)
Cardiology Consultation:  Patient ID: Antonio Oliver MRN: [id number]; DOB: 1956/09/06  Admit date: 01/05/2021 Date of Consult: 01/07/2021  Primary Care Provider: Cher Nakai, MD Primary Cardiologist: Antonio Lindau, MD  Primary Electrophysiologist:  Antonio Haw, MD   Patient Profile:  SAAHIL Oliver is a 65 y.o. male with a hx of diabetes, hypertension, aortic stenosis status post AVR (complicated by complete heart block status post pacemaker implantation), spinal stenosis who is being seen today for the evaluation of preoperative assessment at the request of Antonio Oliver.  History of Present Illness:  Antonio Oliver presents with several days of leg weakness.  He apparently was seen at [facility] and ruled out for blood clots as well as stroke.  He is also had back pain.  Described as worsening with trying to move.  He cannot walk apparently.  He has weakness and paresthesias in the legs.  He also describes sharp burning pain.  He was admitted to [facility] as they were unable to obtain an MRI at outside hospital.  MRI shows spinal stenosis and orthopedics is planning to operate.  He has a prior history of back surgery.  Regarding cardiac history, he has a history of bicuspid aortic valve with severe aortic stenosis.  He underwent aortic valve replacement with a 27 mm magna pericardial tissue valve in July 2018.  His course there was complicated by complete heart block postoperatively.  He underwent pacemaker implantation.  It appears his conduction has returned.  He rarely requires his pacemaker on recent interrogation.  He had a left heart catheterization as part of work-up in May 2018.  At that time it was demonstrated that he had normal left coronary arteries.  He has a known ascending aortic aneurysm up to 44 mm.  This has been stable on most recent echocardiogram.  He had an echocardiogram performed last month that showed normal LV function and normal function of the  bioprosthesis.  His a sending aortic aneurysm is stable.  He informs me he has no cardiac complaints.  He denies any chest pain or shortness of breath.  His EKG is unremarkable.  He reports he cannot walk long distances currently given his orthopedic ailment however he reports no chest pain or shortness of breath.  He reports that he had several fractures in his feet he cannot be that active.  Nonetheless he has no symptoms.  Heart Pathway Score:       Past Medical History: Past Medical History:  Diagnosis Date  . Anxiety 10/30/2018  . Ascending aorta dilation ([place]) 03/06/2019  . Benign prostatic hyperplasia without lower urinary tract symptoms 11/17/2016   Last Assessment & Plan:  Stable PSA today.  Formatting of this note might be different from the original. Last Assessment & Plan:  Stable PSA today. Last Assessment & Plan:  Formatting of this note might be different from the original. Stable PSA today.  . Bicuspid aortic valve 12/22/2016  . BPH (benign prostatic hyperplasia)   . Bradycardia 02/01/2019  . Cervical myelopathy ([place]) 11/14/2018  . Charcot's joint of foot 03/05/2019  . Chronic back pain 04/03/2013  . Chronic bilateral low back pain 11/17/2016  . Chronic inflammatory arthritis 11/17/2016   History of positive rheumatoid factor in the past. Denies placement on immunosuppressive therapy  . Clinical depression 04/03/2013   Last Assessment & Plan:  Formatting of this note might be different from the original. Depression is unchanged.  Discussed sleep hygiene. Medication changes per orders. Depression  will be reassessed in 3 months Has been having drowsiness since taking clonazepam. Wishes to try Buspar daily. Discussed non-pharmacologic methods to reduce anxiety/depression such as working in [employer] and relaxation  . Compartment syndrome ([place]) 06/20/2013   Last Assessment & Plan:  Denies any problems with right leg since April/May Does have swelling at times if he is on this leg for long  periods of time. Wears compression socks regularly and has helped with swelling.  . Complete heart block ([place]) 05/04/2017  . Depression   . Diabetes ([place]) 10/30/2018  . Diabetes mellitus due to underlying condition with unspecified complications ([place]) 8/65/7846  . Diabetes mellitus type 2 in nonobese (HCC)   . DM2 (diabetes mellitus, type 2) ([place]) 04/03/2013   Last Assessment & Plan:  Formatting of this note might be different from the original. Diabetes is unchanged.  Continue current treatment regimen. Regular aerobic exercise. Diabetes will be reassessed in 3 months. Will check A1c today. Denies any problems with feet or sensation.  . Enthesopathy of ankle and tarsus 04/04/2013   Last Assessment & Plan:  Continues with swelling after prolonged standing.  Still  Wearing support hose, which helps.  . Essential hypertension 10/30/2018  . Fatigue 10/30/2018  . FHx: migraine headaches 04/04/2013  . Fracture of capitate bone of wrist 02/26/2019  . Fracture of tibia 03/05/2019  . GERD (gastroesophageal reflux disease) 10/30/2018  . Glaucoma 04/03/2013  . HA (headache) 04/03/2013   Last Assessment & Plan:  Formatting of this note might be different from the original. Infrequent, however still having them.  Not sure of the triggers.  Using zomig, which helps immediately.  Marland Kitchen Heart murmur 11/17/2016  . Hemangioma of skin and subcutaneous tissue 07/07/2017  . History of bariatric surgery 04/04/2013  . History of kidney stones 11/17/2016  . Hyperlipidemia 10/30/2018  . Hypertension   . Hypothyroidism 10/30/2018  . Kidney stone 02/01/2019   Last Assessment & Plan:  Renal condition is unchanged. Continue current medications. Renal condition will be reassessed in 3 months. Discussed increasing fluids, and possibility of pain if stone moves or causes obstruction. Told to call urologist or make appointment if problem worsens.  Last Assessment & Plan:  Formatting of this note might be different from the original. Renal  condition is unchan  . Lentigo 07/07/2017  . Lesion of plantar nerve 04/04/2013  . Leukocytosis   . Migraine 11/17/2016  . Mixed dyslipidemia 10/30/2018  . Morbid obesity ([place]) 10/27/2011  . Multiple actinic keratoses 07/07/2017  . Multiple benign melanocytic nevi 07/07/2017  . Neck pain 04/04/2013  . Neoplasm of uncertain behavior of skin 07/28/2018  . Nondisplaced fracture of medial cuneiform of left foot, initial encounter for closed fracture 11/16/2018  . Nonrheumatic aortic valve stenosis 02/01/2019  . Osteoarthritis 10/30/2018  . Other seborrheic keratosis 07/07/2017  . Personal history of tobacco use, presenting hazards to health 04/04/2013  . Presence of permanent cardiac pacemaker   . S/P AVR (aortic valve replacement) 03/10/2017  . Severe single current episode of major depressive disorder, without psychotic features ([place]) 11/17/2016  . Status post bariatric surgery 04/04/2013  . Strain of rotator cuff 04/04/2013  . Surgery, elective   . Syncope 02/14/2019  . Vitamin D deficiency 10/30/2018  . Weakness     Past Surgical History: Past Surgical History:  Procedure Laterality Date  . ANTERIOR CERVICAL DECOMP/DISCECTOMY FUSION N/A 11/17/2018   Procedure: CERVICAL THREE-CERVICAL FOUR, CERVICAL FOUR-CERVICAL FIVE, CERVICAL FIVE-CERVICAL SIX ANTERIOR CERVICAL DECOMPRESSION/DISCECTOMY FUSION;  Surgeon: Earnie Larsson,  MD;  Location: [place];  Service: Neurosurgery;  Laterality: N/A;  . APPLICATION OF WOUND VAC Left 04/05/2019   Procedure: Application Of Wound Vac;  Surgeon: Erle Crocker, MD;  Location: [place];  Service: Orthopedics;  Laterality: Left;  . BACK SURGERY     x4  . FASCIOTOMY Left 04/05/2019   Left leg 2 compartment fasciotomy  . FASCIOTOMY Left 04/05/2019   Procedure: FASCIOTOMY LEFT LOWER LEG;  Surgeon: Erle Crocker, MD;  Location: [place];  Service: Orthopedics;  Laterality: Left;  . FOOT NEUROMA SURGERY    . GASTRIC BYPASS    . HEMATOMA EVACUATION Left 04/05/2019    Procedure: Evacuation Hematoma Left Lower Leg;  Surgeon: Erle Crocker, MD;  Location: [place];  Service: Orthopedics;  Laterality: Left;  . HEMORRHOID SURGERY     over 30 years ago  . HERNIA REPAIR     LIH umb  . NASAL SINUS SURGERY     x2  . NECK SURGERY       Home Medications:  Prior to Admission medications   Medication Sig Start Date End Date Taking? Authorizing Provider  aspirin EC 81 MG tablet Take 81 mg by mouth daily.   Yes [provider]  atorvastatin (LIPITOR) 20 MG tablet Take 20 mg by mouth daily at 6 PM.    Yes [provider]  brexpiprazole (REXULTI) 2 MG TABS tablet Take 2 mg by mouth daily.    Yes [provider]  buPROPion (WELLBUTRIN SR) 200 MG 12 hr tablet Take 200 mg by mouth 2 (two) times daily.   Yes [provider]  busPIRone (BUSPAR) 10 MG tablet Take 20 mg by mouth 2 (two) times daily.    Yes [provider]  calcium citrate (CALCITRATE - DOSED IN MG ELEMENTAL CALCIUM) 950 MG tablet Take 200 mg of elemental calcium by mouth 2 (two) times daily.   Yes [provider]  Cholecalciferol 125 MCG (5000 UT) TABS Take 125 mcg by mouth daily.   Yes [provider]  clonazePAM (KLONOPIN) 0.5 MG tablet Take 0.5 mg by mouth 2 (two) times daily as needed for anxiety.   Yes [provider]  DULoxetine (CYMBALTA) 60 MG capsule Take 60 mg by mouth 2 (two) times daily.    Yes [provider]  fexofenadine (ALLEGRA) 180 MG tablet Take 180 mg by mouth daily as needed for allergies.   Yes [provider]  finasteride (PROSCAR) 5 MG tablet Take 5 mg by mouth daily.   Yes [provider]  fluticasone (FLONASE) 50 MCG/ACT nasal spray Place 2 sprays into the nose daily as needed for allergies or rhinitis.   Yes [provider]  folic acid (FOLVITE) 1 MG tablet Take 1 mg by mouth daily.   Yes [provider]  furosemide (LASIX) 20 MG tablet Take 1 tablet (20 mg total)  by mouth as needed (for leg swelling). Patient taking differently: Take 20 mg by mouth daily as needed for fluid. 07/12/19  Yes Revankar, Reita Cliche, MD  gabapentin (NEURONTIN) 600 MG tablet Take 600-1,200 mg by mouth 3 (three) times daily.   Yes [provider]  Galcanezumab-gnlm (EMGALITY) 120 MG/ML SOAJ Inject 120 mg into the skin every 28 (twenty-eight) days. 12/23/20  Yes Pieter Partridge, Oliver  levothyroxine (SYNTHROID) 125 MCG tablet Take 125 mcg by mouth daily.    Yes [provider]  lisinopril (ZESTRIL) 5 MG tablet Take 5 mg daily and as needed for  BP greater than 160. Patient taking differently: Take 5 mg by mouth daily. 12/05/20  Yes Revankar, Reita Cliche, MD  Magnesium 500 MG CAPS Take 500 mg by mouth 2 (two) times a day.    Yes [provider]  metFORMIN (GLUCOPHAGE) 1000 MG tablet Take 1,000 mg by mouth 2 (two) times daily with a meal.   Yes [provider]  metoprolol succinate (TOPROL-XL) 50 MG 24 hr tablet Take 25 mg by mouth at bedtime. Take with or immediately following a meal.   Yes [provider]  mirtazapine (REMERON) 30 MG tablet Take 30 mg by mouth at bedtime.    Yes [provider]  Multiple Vitamins-Minerals (MULTIVITAMIN WITH MINERALS) tablet Take 1 tablet by mouth daily.   Yes [provider]  mupirocin ointment (BACTROBAN) 2 % Apply 1 application topically 3 (three) times daily as needed (infection). 11/14/20  Yes [provider]  omeprazole (PRILOSEC) 20 MG capsule Take 20 mg by mouth 2 (two) times daily before a meal.    Yes [provider]  oxyCODONE-acetaminophen (PERCOCET) 10-325 MG tablet Take 1 tablet by mouth 3 (three) times daily.   Yes [provider]  potassium chloride SA (KLOR-CON) 20 MEQ tablet Take 20 mEq by mouth daily as needed (potassium defficiency). With lasix   Yes [provider]  Semaglutide (OZEMPIC, 0.25 OR 0.5 MG/DOSE, [place]) Inject 0.5 mg into the skin once a week.    Yes [provider]  solifenacin (VESICARE) 10 MG tablet Take 10 mg by mouth daily.   Yes [provider]  testosterone cypionate (DEPOTESTOSTERONE CYPIONATE) 200 MG/ML injection Inject 200 mg into the muscle every 30 (thirty) days.   Yes [provider]  tiZANidine (ZANAFLEX) 4 MG capsule Take 4-8 mg by mouth 3 (three) times daily as needed for muscle spasms.   Yes [provider]  traMADol (ULTRAM) 50 MG tablet Take 50 mg by mouth 3 (three) times daily.   Yes [provider]  Ubrogepant 100 MG TABS Take 100 mg by mouth daily as needed for migraine.   Yes [provider]  COVID-19 mRNA vaccine, Pfizer, 30 MCG/0.3ML injection USE AS DIRECTED 06/24/20 06/24/21  Carlyle Basques, MD  diclofenac Sodium (VOLTAREN) 1 % GEL Apply 1 application topically 4 (four) times daily. 12/25/20   [provider]    Inpatient Medications: Scheduled Meds: . aspirin EC  81 mg Oral Daily  . atorvastatin  20 mg Oral q1800  . brexpiprazole  2 mg Oral Daily  . buPROPion  200 mg Oral BID  . busPIRone  20 mg Oral BID  . darifenacin  7.5 mg Oral Daily  . DULoxetine  60 mg Oral BID  . finasteride  5 mg Oral Daily  . folic acid  1 mg Oral Daily  . gabapentin  600-1,200 mg Oral TID  . insulin aspart  0-15 Units Subcutaneous TID WC  . insulin aspart  0-5 Units Subcutaneous QHS  . levothyroxine  125 mcg Oral Q0600  . magnesium oxide  400 mg Oral BID  . metoprolol succinate  25 mg Oral QHS  . mirtazapine  30 mg Oral QHS  . multivitamin with minerals  1 tablet Oral Daily  . oxyCODONE-acetaminophen  1 tablet Oral Q6H   And  . oxyCODONE  5 mg Oral Q6H   Continuous Infusions:  PRN Meds: clonazePAM, HYDROmorphone (DILAUDID) injection, ondansetron **OR** ondansetron (ZOFRAN) IV, SUMAtriptan, tiZANidine  Allergies:    Allergies  Allergen Reactions  .  Morphine Itching    Can take with benadry, facial and nose itching     Social History:   Social  History   Socioeconomic History  . Marital status: Married    Spouse name: susan   . Number of children: 1  . Years of education: Not on file  . Highest education level: Bachelor's degree (e.g., BA, AB, BS)  Occupational History  . Not on file  Tobacco Use  . Smoking status: Never Smoker  . Smokeless tobacco: Never Used  Vaping Use  . Vaping Use: Never used  Substance and Sexual Activity  . Alcohol use: Yes    Alcohol/week: 1.0 standard drink    Types: 1 Glasses of wine per week    Comment: occ.  . Drug use: No  . Sexual activity: Not on file  Other Topics Concern  . Not on file  Social History Narrative   Social Review      Patient lives at home with. Spouse [name]   Pt lives in a one or two story home? TWO Story patient lives on first story of home   Does patient lives in a facility? If so where? Private home   What is patient highest level of education? Bachelors degree   Is patient RIGHT or LEFT handed? Right Hand   Does patient drink coffee, tea, soda? Coffee, Tea   How much coffee, tea, soda does patient drink? Coffee in AM, Tea off and on all day   Does patient exercise regularly? No    Social Determinants of Radio broadcast assistant Strain: Not on file  Food Insecurity: Not on file  Transportation Needs: Not on file  Physical Activity: Not on file  Stress: Not on file  Social Connections: Not on file  Intimate Partner Violence: Not on file     Family History:    Family History  Problem Relation Age of Onset  . Cancer Mother        breast  . Rheum arthritis Mother   . Diabetes Mother   . Heart disease Father   . Kidney disease Father   . Diabetes Father   . Hypertension Brother   . Diabetes Brother   . Osteoarthritis Brother   . Heart disease Brother   . Hypertension Brother   . Diabetes Brother   . Rheum arthritis Brother   . Depression Daughter   . Anxiety disorder Daughter      ROS:  All other ROS reviewed and negative. Pertinent  positives noted in the HPI.     Physical Exam/Data:   Vitals:   01/07/21 0548 01/07/21 0914 01/07/21 1428 01/07/21 1937  BP: (!) 155/96 (!) 142/95 115/64 (!) 149/83  Pulse: 67 66 77 69  Resp: 18 17 17 16   Temp: 98 F (36.7 C) 98.7 F (37.1 C) 98.3 F (36.8 C) 98.2 F (36.8 C)  TempSrc: Oral Oral Oral Oral  SpO2: 97% 96% 96% 100%  Weight:      Height:         Intake/Output Summary (Last 24 hours) at 01/07/2021 2015 Last data filed at 01/07/2021 0600 Gross per 24 hour  Intake --  Output 1500 ml  Net -1500 ml    Last 3 Weights 01/05/2021 12/31/2020 12/05/2020  Weight (lbs) 199 lb 199 lb 198 lb 6.4 oz  Weight (kg) 90.266 kg 90.266 kg 89.994 kg    Body mass index is 24.87 kg/m.  General: Well nourished, well developed, in no acute distress Head: Atraumatic,  normal size  Eyes: PEERLA, EOMI  Neck: Supple, no JVD Endocrine: No thryomegaly Cardiac: Normal S1, S2; RRR; 2 out of 6 systolic ejection murmur Lungs: Clear to auscultation bilaterally, no wheezing, rhonchi or rales  Abd: Soft, nontender, no hepatomegaly  Ext: No edema, pulses 2+ Musculoskeletal: No deformities, BUE and BLE strength normal and equal Skin: Warm and dry, no rashes   Neuro: Alert and oriented to person, place, time, and situation, CNII-XII grossly intact, no focal deficits  Psych: Normal mood and affect   EKG:  The EKG was personally reviewed and demonstrates: Normal sinus rhythm, heart rate 64, old anterior infarct  Lumbar MR 01/06/2021 IMPRESSION: 1. Since previous MRI of 2008, the patient has undergone laminectomy and PLIF from L4 through S1. The spinal canal and neural foramina are well decompressed at the operative levels, and the interbody fusion appears solid. 2. Interval development of adjacent segment disease at L3-4 with advanced bilateral facet hypertrophy contributing to moderate to severe multifactorial spinal stenosis. 3. Progressive disc and endplate degeneration asymmetric to the right at  L2-3 with mild spinal stenosis and mild right foraminal narrowing. 4. Moderate bladder distension without demonstrated cause.  Relevant CV Studies: TTE 12/12/2020 1. Left ventricular ejection fraction, by estimation, is 60 to 65%. The  left ventricle has normal function. The left ventricle has no regional  wall motion abnormalities. There is mild concentric left ventricular  hypertrophy. Left ventricular diastolic  parameters are indeterminate. The average left ventricular global  longitudinal strain is -8.9 %. The global longitudinal strain is abnormal.  2. Right ventricular systolic function is normal. The right ventricular  size is normal. There is normal pulmonary artery systolic pressure.  3. The mitral valve is leaflets is thickened with suspicion for redundant  chordae. Moderate mitral valve regurgitation. No evidence of mitral  stenosis.  4. Well seated bioprosthetic aortic valve. Aortic valve regurgitation is  not visualized. No aortic stenosis is present.  5. Aneurysm of the ascending aorta, measuring 44 mm. The abdominal aorta  is dilated (2.5 cm).  6. The inferior vena cava is normal in size with greater than 50%  respiratory variability, suggesting right atrial pressure of 3 mmHg.   Laboratory Data: High Sensitivity Troponin:  No results for input(s): TROPONINIHS in the last 720 hours.   Cardiac EnzymesNo results for input(s): TROPONINI in the last 168 hours. No results for input(s): TROPIPOC in the last 168 hours.  Chemistry Recent Labs  Lab 01/05/21 1233 01/06/21 0414  NA 141 139  K 4.5 4.3  CL 108 106  CO2 25 26  GLUCOSE 128* 129*  BUN 12 10  CREATININE 0.89 1.00  CALCIUM 9.4 9.1  GFRNONAA >60 >60  ANIONGAP 8 7    Recent Labs  Lab 01/06/21 0414  PROT 5.4*  ALBUMIN 3.1*  AST 23  ALT 49*  ALKPHOS 60  BILITOT 0.8   Hematology Recent Labs  Lab 01/05/21 1233 01/06/21 0414  WBC 8.4 7.5  RBC 4.30 4.22  HGB 13.9 13.2  HCT 40.6 39.0  MCV 94.4  92.4  MCH 32.3 31.3  MCHC 34.2 33.8  RDW 12.0 11.9  PLT 241 188   BNPNo results for input(s): BNP, PROBNP in the last 168 hours.  DDimer No results for input(s): DDIMER in the last 168 hours.  Radiology/Studies:  MR Lumbar Spine W Wo Contrast  Result Date: 01/06/2021 CLINICAL DATA:  Progressive low back pain with right leg weakness. History of diabetes and neck surgery. EXAM: MRI LUMBAR SPINE WITHOUT  AND WITH CONTRAST TECHNIQUE: Multiplanar and multiecho pulse sequences of the lumbar spine were obtained without and with intravenous contrast. CONTRAST:  16mL GADAVIST GADOBUTROL 1 MMOL/ML IV SOLN COMPARISON:  Lumbar spine radiographs 12/31/2020. Lumbar MRI 04/28/2007. FINDINGS: Segmentation: Conventional anatomy assumed, with the last open disc space designated L5-S1.Concordant with previous imaging. Alignment:  Physiologic. Vertebrae: No worrisome osseous lesion, acute fracture or pars defect. Since the previous MRI, the patient has undergone posterior lumbar and interbody fusion from L4 through S1. The interbody fusion appears solid. There are progressive endplate degenerative changes at L2-3. The visualized sacroiliac joints appear unremarkable. Conus medullaris: Extends to the T12-L1 level and appears normal. No abnormal intradural enhancement. Paraspinal and other soft tissues: No significant paraspinal findings. The urinary bladder is moderately distended. Disc levels: Sagittal images demonstrate no significant disc space findings at T11-12 or T12-L1. L1-2: Disc height and hydration are maintained. Mild bilateral facet hypertrophy contributing to mild left foraminal narrowing. L2-3: Progressive disc bulging with endplate degeneration asymmetric to the right. Mild facet and ligamentous hypertrophy. These factors contribute to mild spinal stenosis and mild right foraminal narrowing. L3-4: Adjacent segment disease with mild loss of disc height and annular disc bulging. There is advanced bilateral facet  and ligamentous hypertrophy with bilateral facet joint effusions and resulting moderate to severe spinal stenosis. There is moderate narrowing of the lateral recesses and mild foraminal narrowing bilaterally. Interspinous process fluid is also present, consistent with [place] disease. L4-5: Status post laminectomy and PLIF. The spinal canal and neural foramina are patent. L5-S1: Status post laminectomy and PLIF. The spinal canal and neural foramina are patent. IMPRESSION: 1. Since previous MRI of 2008, the patient has undergone laminectomy and PLIF from L4 through S1. The spinal canal and neural foramina are well decompressed at the operative levels, and the interbody fusion appears solid. 2. Interval development of adjacent segment disease at L3-4 with advanced bilateral facet hypertrophy contributing to moderate to severe multifactorial spinal stenosis. 3. Progressive disc and endplate degeneration asymmetric to the right at L2-3 with mild spinal stenosis and mild right foraminal narrowing. 4. Moderate bladder distension without demonstrated cause. Electronically Signed   By: Richardean Sale M.D.   On: 01/06/2021 12:59    Assessment and Plan:   1.  Preoperative assessment -History of aortic valve replacement in 2018.  Also had pacemaker implanted as a result of complete heart block postoperatively.  He had a left heart catheterization preoperatively showed normal arteries. -He has a known ascending aortic aneurysm with a 44 mm. -All of his cardiac conditions are stable.  He denies any chest pain or shortness of breath.  His EKG is unremarkable. -Pacemaker is working fine.  He rarely requires it. -He has no cardiac symptoms and prior to his current state he was able to complete greater than 4 METS. -Given that his most recent echo was stable I see no need for further cardiac testing.  He also had an ascending aortic aneurysm on CT scan in 2020 up to 44 mm.  On his most recent echo it is still 44 mm.   There is nothing emergent here. -He may proceed to surgery.  He will receive antibiotics preoperatively which I Oliver agree with. -There is nothing that needs to be done from a cardiovascular standpoint.  We will be available if needed.  He may proceed to surgery.  [facility] will sign off.   Medication Recommendations:  None Other recommendations (labs, testing, etc):  None Follow up as an outpatient:  Keep scheduled follow-up  For questions or updates, please contact [place] Please consult [email or web address] for contact info under   Signed, [place] T. Audie Box, MD, [place]  01/07/2021 8:15 PM

## 2021-01-07 NOTE — Consult Note (Addendum)
Reason for Consult:Worsening bilateral leg weakness due to spinal stenosis. Referring Physician: Dr. [place]/Dr. Arbutus Oliver Consulting Physician:Antonio Oliver [facility]  Orthopedic Diagnosis:Superior adjacent level L3-4 severe spinal stenosis with bilateral leg weakness and bladder distension. Fusion L4 to S1 with retained hardware pedicle screws and rods and cages. Fusion about 10 years ago.  Degenerative disc disease L2-3 with mild spinal stenosis.  History of AVR 2018 with indwelling bichamber pacemaker, cardiologist is  Dr. Geraldo Oliver   ZOX:WRUEAV T Antonio Oliver is an 65 y.o. male with history of cervical stenosis and lumbar spinal stenosis and DDD. Post lumbar fusion surgery in 2015 L4 to S1 2 level TLIF. He reports chronic left leg weakness but only last week started to Experience pain and numbness and weakness into the right leg with numbness into the right knee and anterior right shin. He is weak in standing and walking and was using a cane until Thursday and he has had previous Pacemaker since 2018. Had aortic valve replacment and had changes of the perivalve area and a pacemaker was placed. The last time the pacer was interrogated it was 99.9% unused. He sees Dr. Geraldo Oliver in [place] with  [facility]. Seen in [place] ER last week and then referred to my office, seen on Wednsday 4/27 and MRI was ordered, unfortunately due to pacemaker only [facility] is able to perform MRI and this was scheduled. However due to worsening back and right leg pain and increasing weakness he presented to [facility] ER on Monday. MRI was done  yesterday with study demonstrating severe L3-4 spinal stenosis with bladder distension. He was admitted for pain control, evaluation and treatment. He has been seen by CIR for consideration of inpatient rehab.     Past Medical History:  Diagnosis Date  . Anxiety 10/30/2018  . Ascending aorta dilation ([place]) 03/06/2019  . Benign prostatic hyperplasia without lower urinary tract symptoms  11/17/2016   Last Assessment & Plan:  Stable PSA today.  Formatting of this note might be different from the original. Last Assessment & Plan:  Stable PSA today. Last Assessment & Plan:  Formatting of this note might be different from the original. Stable PSA today.  . Bicuspid aortic valve 12/22/2016  . BPH (benign prostatic hyperplasia)   . Bradycardia 02/01/2019  . Cervical myelopathy ([place]) 11/14/2018  . Charcot's joint of foot 03/05/2019  . Chronic back pain 04/03/2013  . Chronic bilateral low back pain 11/17/2016  . Chronic inflammatory arthritis 11/17/2016   History of positive rheumatoid factor in the past. Denies placement on immunosuppressive therapy  . Clinical depression 04/03/2013   Last Assessment & Plan:  Formatting of this note might be different from the original. Depression is unchanged.  Discussed sleep hygiene. Medication changes per orders. Depression will be reassessed in 3 months Has been having drowsiness since taking clonazepam. Wishes to try Buspar daily. Discussed non-pharmacologic methods to reduce anxiety/depression such as working in [employer] and relaxation  . Compartment syndrome ([place]) 06/20/2013   Last Assessment & Plan:  Denies any problems with right leg since April/May Does have swelling at times if he is on this leg for long periods of time. Wears compression socks regularly and has helped with swelling.  . Complete heart block ([place]) 05/04/2017  . Depression   . Diabetes ([place]) 10/30/2018  . Diabetes mellitus due to underlying condition with unspecified complications ([place]) 12/13/8117  . Diabetes mellitus type 2 in nonobese (HCC)   . DM2 (diabetes mellitus, type 2) ([place]) 04/03/2013   Last Assessment &  Plan:  Formatting of this note might be different from the original. Diabetes is unchanged.  Continue current treatment regimen. Regular aerobic exercise. Diabetes will be reassessed in 3 months. Will check A1c today. Denies any problems with feet or sensation.  . Enthesopathy  of ankle and tarsus 04/04/2013   Last Assessment & Plan:  Continues with swelling after prolonged standing.  Still  Wearing support hose, which helps.  . Essential hypertension 10/30/2018  . Fatigue 10/30/2018  . FHx: migraine headaches 04/04/2013  . Fracture of capitate bone of wrist 02/26/2019  . Fracture of tibia 03/05/2019  . GERD (gastroesophageal reflux disease) 10/30/2018  . Glaucoma 04/03/2013  . HA (headache) 04/03/2013   Last Assessment & Plan:  Formatting of this note might be different from the original. Infrequent, however still having them.  Not sure of the triggers.  Using zomig, which helps immediately.  Marland Kitchen Heart murmur 11/17/2016  . Hemangioma of skin and subcutaneous tissue 07/07/2017  . History of bariatric surgery 04/04/2013  . History of kidney stones 11/17/2016  . Hyperlipidemia 10/30/2018  . Hypertension   . Hypothyroidism 10/30/2018  . Kidney stone 02/01/2019   Last Assessment & Plan:  Renal condition is unchanged. Continue current medications. Renal condition will be reassessed in 3 months. Discussed increasing fluids, and possibility of pain if stone moves or causes obstruction. Told to call urologist or make appointment if problem worsens.  Last Assessment & Plan:  Formatting of this note might be different from the original. Renal condition is unchan  . Lentigo 07/07/2017  . Lesion of plantar nerve 04/04/2013  . Leukocytosis   . Migraine 11/17/2016  . Mixed dyslipidemia 10/30/2018  . Morbid obesity ([place]) 10/27/2011  . Multiple actinic keratoses 07/07/2017  . Multiple benign melanocytic nevi 07/07/2017  . Neck pain 04/04/2013  . Neoplasm of uncertain behavior of skin 07/28/2018  . Nondisplaced fracture of medial cuneiform of left foot, initial encounter for closed fracture 11/16/2018  . Nonrheumatic aortic valve stenosis 02/01/2019  . Osteoarthritis 10/30/2018  . Other seborrheic keratosis 07/07/2017  . Personal history of tobacco use, presenting hazards to health 04/04/2013  .  Presence of permanent cardiac pacemaker   . S/P AVR (aortic valve replacement) 03/10/2017  . Severe single current episode of major depressive disorder, without psychotic features ([place]) 11/17/2016  . Status post bariatric surgery 04/04/2013  . Strain of rotator cuff 04/04/2013  . Surgery, elective   . Syncope 02/14/2019  . Vitamin D deficiency 10/30/2018  . Weakness     Past Surgical History:  Procedure Laterality Date  . ANTERIOR CERVICAL DECOMP/DISCECTOMY FUSION N/A 11/17/2018   Procedure: CERVICAL THREE-CERVICAL FOUR, CERVICAL FOUR-CERVICAL FIVE, CERVICAL FIVE-CERVICAL SIX ANTERIOR CERVICAL DECOMPRESSION/DISCECTOMY FUSION;  Surgeon: Earnie Larsson, MD;  Location: [place];  Service: Neurosurgery;  Laterality: N/A;  . APPLICATION OF WOUND VAC Left 04/05/2019   Procedure: Application Of Wound Vac;  Surgeon: Erle Crocker, MD;  Location: [place];  Service: Orthopedics;  Laterality: Left;  . BACK SURGERY     x4  . FASCIOTOMY Left 04/05/2019   Left leg 2 compartment fasciotomy  . FASCIOTOMY Left 04/05/2019   Procedure: FASCIOTOMY LEFT LOWER LEG;  Surgeon: Erle Crocker, MD;  Location: [place];  Service: Orthopedics;  Laterality: Left;  . FOOT NEUROMA SURGERY    . GASTRIC BYPASS    . HEMATOMA EVACUATION Left 04/05/2019   Procedure: Evacuation Hematoma Left Lower Leg;  Surgeon: Erle Crocker, MD;  Location: [place];  Service: Orthopedics;  Laterality: Left;  .  HEMORRHOID SURGERY     over 30 years ago  . HERNIA REPAIR     LIH umb  . NASAL SINUS SURGERY     x2  . NECK SURGERY      Family History  Problem Relation Age of Onset  . Cancer Mother        breast  . Rheum arthritis Mother   . Diabetes Mother   . Heart disease Father   . Kidney disease Father   . Diabetes Father   . Hypertension Brother   . Diabetes Brother   . Osteoarthritis Brother   . Heart disease Brother   . Hypertension Brother   . Diabetes Brother   . Rheum arthritis Brother   . Depression Daughter   .  Anxiety disorder Daughter     Social History:  reports that he has never smoked. He has never used smokeless tobacco. He reports current alcohol use of about 1.0 standard drink of alcohol per week. He reports that he does not use drugs.  Allergies:  Allergies  Allergen Reactions  . Morphine Itching    Can take with benadry, facial and nose itching     Medications:  Prior to Admission:  Medications Prior to Admission  Medication Sig Dispense Refill Last Dose  . aspirin EC 81 MG tablet Take 81 mg by mouth daily.   01/05/2021 at Unknown time  . atorvastatin (LIPITOR) 20 MG tablet Take 20 mg by mouth daily at 6 PM.    01/04/2021 at Unknown time  . brexpiprazole (REXULTI) 2 MG TABS tablet Take 2 mg by mouth daily.    01/04/2021 at Unknown time  . buPROPion (WELLBUTRIN SR) 200 MG 12 hr tablet Take 200 mg by mouth 2 (two) times daily.   01/05/2021 at Unknown time  . busPIRone (BUSPAR) 10 MG tablet Take 20 mg by mouth 2 (two) times daily.    01/05/2021 at Unknown time  . calcium citrate (CALCITRATE - DOSED IN MG ELEMENTAL CALCIUM) 950 MG tablet Take 200 mg of elemental calcium by mouth 2 (two) times daily.   01/05/2021 at Unknown time  . Cholecalciferol 125 MCG (5000 UT) TABS Take 125 mcg by mouth daily.   01/05/2021 at Unknown time  . clonazePAM (KLONOPIN) 0.5 MG tablet Take 0.5 mg by mouth 2 (two) times daily as needed for anxiety.   Past Month at Unknown time  . DULoxetine (CYMBALTA) 60 MG capsule Take 60 mg by mouth 2 (two) times daily.    01/05/2021 at Unknown time  . fexofenadine (ALLEGRA) 180 MG tablet Take 180 mg by mouth daily as needed for allergies.   Past Month at Unknown time  . finasteride (PROSCAR) 5 MG tablet Take 5 mg by mouth daily.   01/05/2021 at Unknown time  . fluticasone (FLONASE) 50 MCG/ACT nasal spray Place 2 sprays into the nose daily as needed for allergies or rhinitis.   Past Month at Unknown time  . folic acid (FOLVITE) 1 MG tablet Take 1 mg by mouth daily.   01/05/2021 at Unknown time   . furosemide (LASIX) 20 MG tablet Take 1 tablet (20 mg total) by mouth as needed (for leg swelling). (Patient taking differently: Take 20 mg by mouth daily as needed for fluid.) 30 tablet 4 Past Month at Unknown time  . gabapentin (NEURONTIN) 600 MG tablet Take 600-1,200 mg by mouth 3 (three) times daily.   01/05/2021 at Unknown time  . Galcanezumab-gnlm (EMGALITY) 120 MG/ML SOAJ Inject 120 mg into the skin  every 28 (twenty-eight) days. 1 mL 0 12/05/2020  . levothyroxine (SYNTHROID) 125 MCG tablet Take 125 mcg by mouth daily.    01/05/2021 at Unknown time  . lisinopril (ZESTRIL) 5 MG tablet Take 5 mg daily and as needed for BP greater than 160. (Patient taking differently: Take 5 mg by mouth daily.) 135 tablet 3 01/05/2021 at Unknown time  . Magnesium 500 MG CAPS Take 500 mg by mouth 2 (two) times a day.    01/05/2021 at Unknown time  . metFORMIN (GLUCOPHAGE) 1000 MG tablet Take 1,000 mg by mouth 2 (two) times daily with a meal.   01/05/2021 at Unknown time  . metoprolol succinate (TOPROL-XL) 50 MG 24 hr tablet Take 25 mg by mouth at bedtime. Take with or immediately following a meal.   01/04/2021 at 10 pm  . mirtazapine (REMERON) 30 MG tablet Take 30 mg by mouth at bedtime.    01/04/2021 at Unknown time  . Multiple Vitamins-Minerals (MULTIVITAMIN WITH MINERALS) tablet Take 1 tablet by mouth daily.   01/05/2021 at Unknown time  . mupirocin ointment (BACTROBAN) 2 % Apply 1 application topically 3 (three) times daily as needed (infection).   01/03/2021  . omeprazole (PRILOSEC) 20 MG capsule Take 20 mg by mouth 2 (two) times daily before a meal.    01/05/2021 at Unknown time  . oxyCODONE-acetaminophen (PERCOCET) 10-325 MG tablet Take 1 tablet by mouth 3 (three) times daily.   01/05/2021 at Unknown time  . potassium chloride SA (KLOR-CON) 20 MEQ tablet Take 20 mEq by mouth daily as needed (potassium defficiency). With lasix   Past Month at Unknown time  . Semaglutide (OZEMPIC, 0.25 OR 0.5 MG/DOSE, [place]) Inject 0.5 mg into the  skin once a week.   12/29/2020  . solifenacin (VESICARE) 10 MG tablet Take 10 mg by mouth daily.   01/05/2021 at Unknown time  . testosterone cypionate (DEPOTESTOSTERONE CYPIONATE) 200 MG/ML injection Inject 200 mg into the muscle every 30 (thirty) days.   11/04/2020  . tiZANidine (ZANAFLEX) 4 MG capsule Take 4-8 mg by mouth 3 (three) times daily as needed for muscle spasms.   01/05/2021 at Unknown time  . traMADol (ULTRAM) 50 MG tablet Take 50 mg by mouth 3 (three) times daily.   01/04/2021 at Unknown time  . Ubrogepant 100 MG TABS Take 100 mg by mouth daily as needed for migraine.   01/02/2021  . COVID-19 mRNA vaccine, Pfizer, 30 MCG/0.3ML injection USE AS DIRECTED .3 mL 0   . diclofenac Sodium (VOLTAREN) 1 % GEL Apply 1 application topically 4 (four) times daily.      Scheduled: . aspirin EC  81 mg Oral Daily  . atorvastatin  20 mg Oral q1800  . brexpiprazole  2 mg Oral Daily  . buPROPion  200 mg Oral BID  . busPIRone  20 mg Oral BID  . darifenacin  7.5 mg Oral Daily  . DULoxetine  60 mg Oral BID  . finasteride  5 mg Oral Daily  . folic acid  1 mg Oral Daily  . gabapentin  600-1,200 mg Oral TID  . insulin aspart  0-15 Units Subcutaneous TID WC  . insulin aspart  0-5 Units Subcutaneous QHS  . levothyroxine  125 mcg Oral Q0600  . magnesium oxide  400 mg Oral BID  . metoprolol succinate  25 mg Oral QHS  . mirtazapine  30 mg Oral QHS  . multivitamin with minerals  1 tablet Oral Daily  . oxyCODONE-acetaminophen  1 tablet Oral Q6H  And  . oxyCODONE  5 mg Oral Q6H   Continuous:   Results for orders placed or performed during the hospital encounter of 01/05/21 (from the past 48 hour(s))  Glucose, capillary     Status: Abnormal   Collection Time: 01/05/21  9:00 PM  Result Value Ref Range   Glucose-Capillary 112 (H) 70 - 99 mg/dL    Comment: Glucose reference range applies only to samples taken after fasting for at least 8 hours.  Comprehensive metabolic panel     Status: Abnormal    Collection Time: 01/06/21  4:14 AM  Result Value Ref Range   Sodium 139 135 - 145 mmol/L   Potassium 4.3 3.5 - 5.1 mmol/L   Chloride 106 98 - 111 mmol/L   CO2 26 22 - 32 mmol/L   Glucose, Bld 129 (H) 70 - 99 mg/dL    Comment: Glucose reference range applies only to samples taken after fasting for at least 8 hours.   BUN 10 8 - 23 mg/dL   Creatinine, Ser 1.00 0.61 - 1.24 mg/dL   Calcium 9.1 8.9 - 10.3 mg/dL   Total Protein 5.4 (L) 6.5 - 8.1 g/dL   Albumin 3.1 (L) 3.5 - 5.0 g/dL   AST 23 15 - 41 U/L   ALT 49 (H) 0 - 44 U/L   Alkaline Phosphatase 60 38 - 126 U/L   Total Bilirubin 0.8 0.3 - 1.2 mg/dL   GFR, Estimated >60 >60 mL/min    Comment: (NOTE) Calculated using the CKD-EPI Creatinine Equation (2021)    Anion gap 7 5 - 15    Comment: Performed at [facility], [place] [address], [place] [address]  CBC     Status: None   Collection Time: 01/06/21  4:14 AM  Result Value Ref Range   WBC 7.5 4.0 - 10.5 K/uL   RBC 4.22 4.22 - 5.81 MIL/uL   Hemoglobin 13.2 13.0 - 17.0 g/dL   HCT 39.0 39.0 - 52.0 %   MCV 92.4 80.0 - 100.0 fL   MCH 31.3 26.0 - 34.0 pg   MCHC 33.8 30.0 - 36.0 g/dL   RDW 11.9 11.5 - 15.5 %   Platelets 188 150 - 400 K/uL   nRBC 0.0 0.0 - 0.2 %    Comment: Performed at [facility], [place] [address], [place], [place] [address]  Glucose, capillary     Status: Abnormal   Collection Time: 01/06/21  6:29 AM  Result Value Ref Range   Glucose-Capillary 116 (H) 70 - 99 mg/dL    Comment: Glucose reference range applies only to samples taken after fasting for at least 8 hours.  Glucose, capillary     Status: Abnormal   Collection Time: 01/06/21  7:38 AM  Result Value Ref Range   Glucose-Capillary 124 (H) 70 - 99 mg/dL    Comment: Glucose reference range applies only to samples taken after fasting for at least 8 hours.  Glucose, capillary     Status: Abnormal   Collection Time: 01/06/21  2:07 PM  Result Value Ref Range   Glucose-Capillary 128 (H) 70 - 99  mg/dL    Comment: Glucose reference range applies only to samples taken after fasting for at least 8 hours.  Glucose, capillary     Status: Abnormal   Collection Time: 01/06/21  4:47 PM  Result Value Ref Range   Glucose-Capillary 169 (H) 70 - 99 mg/dL    Comment: Glucose reference range applies only to  samples taken after fasting for at least 8 hours.  Glucose, capillary     Status: Abnormal   Collection Time: 01/06/21  9:30 PM  Result Value Ref Range   Glucose-Capillary 166 (H) 70 - 99 mg/dL    Comment: Glucose reference range applies only to samples taken after fasting for at least 8 hours.  Glucose, capillary     Status: Abnormal   Collection Time: 01/07/21 11:30 AM  Result Value Ref Range   Glucose-Capillary 165 (H) 70 - 99 mg/dL    Comment: Glucose reference range applies only to samples taken after fasting for at least 8 hours.  Glucose, capillary     Status: Abnormal   Collection Time: 01/07/21  4:11 PM  Result Value Ref Range   Glucose-Capillary 177 (H) 70 - 99 mg/dL    Comment: Glucose reference range applies only to samples taken after fasting for at least 8 hours.    MR Lumbar Spine W Wo Contrast  Result Date: 01/06/2021 CLINICAL DATA:  Progressive low back pain with right leg weakness. History of diabetes and neck surgery. EXAM: MRI LUMBAR SPINE WITHOUT AND WITH CONTRAST TECHNIQUE: Multiplanar and multiecho pulse sequences of the lumbar spine were obtained without and with intravenous contrast. CONTRAST:  38mL GADAVIST GADOBUTROL 1 MMOL/ML IV SOLN COMPARISON:  Lumbar spine radiographs 12/31/2020. Lumbar MRI 04/28/2007. FINDINGS: Segmentation: Conventional anatomy assumed, with the last open disc space designated L5-S1.Concordant with previous imaging. Alignment:  Physiologic. Vertebrae: No worrisome osseous lesion, acute fracture or pars defect. Since the previous MRI, the patient has undergone posterior lumbar and interbody fusion from L4 through S1. The interbody fusion appears  solid. There are progressive endplate degenerative changes at L2-3. The visualized sacroiliac joints appear unremarkable. Conus medullaris: Extends to the T12-L1 level and appears normal. No abnormal intradural enhancement. Paraspinal and other soft tissues: No significant paraspinal findings. The urinary bladder is moderately distended. Disc levels: Sagittal images demonstrate no significant disc space findings at T11-12 or T12-L1. L1-2: Disc height and hydration are maintained. Mild bilateral facet hypertrophy contributing to mild left foraminal narrowing. L2-3: Progressive disc bulging with endplate degeneration asymmetric to the right. Mild facet and ligamentous hypertrophy. These factors contribute to mild spinal stenosis and mild right foraminal narrowing. L3-4: Adjacent segment disease with mild loss of disc height and annular disc bulging. There is advanced bilateral facet and ligamentous hypertrophy with bilateral facet joint effusions and resulting moderate to severe spinal stenosis. There is moderate narrowing of the lateral recesses and mild foraminal narrowing bilaterally. Interspinous process fluid is also present, consistent with [place] disease. L4-5: Status post laminectomy and PLIF. The spinal canal and neural foramina are patent. L5-S1: Status post laminectomy and PLIF. The spinal canal and neural foramina are patent. IMPRESSION: 1. Since previous MRI of 2008, the patient has undergone laminectomy and PLIF from L4 through S1. The spinal canal and neural foramina are well decompressed at the operative levels, and the interbody fusion appears solid. 2. Interval development of adjacent segment disease at L3-4 with advanced bilateral facet hypertrophy contributing to moderate to severe multifactorial spinal stenosis. 3. Progressive disc and endplate degeneration asymmetric to the right at L2-3 with mild spinal stenosis and mild right foraminal narrowing. 4. Moderate bladder distension without  demonstrated cause. Electronically Signed   By: Richardean Sale M.D.   On: 01/06/2021 12:59   Aortic Valve Replacement 03/10/2017 1. 03/10/2017 S/P Aortic Valve Replacement with a 27 mm [facility] Pericardial Tissue Valve  serial # [id number] Pacemaker  Placement 03/15/2017 2. 03/15/2017 S/P Dual Chamber Pacemaker (St. Jude) secondary to Complete Heart Block  Cardiologist: Jyl Heinz, MD, [facility], apparently has had recent ECHO and interegation of pacemaker with extremely little activity.   ROS Blood pressure (!) 149/83, pulse 69, temperature 98.2 F (36.8 C), temperature source Oral, resp. rate 16, height 6\' 3"  (1.905 m), weight 90.3 kg, SpO2 100 %. Physical Exam  Orthopaedic Exam: In bedside recliner. Right leg with numerous bandaids for abrasions, he has had falls and is contusing the anterior legs and calfs. Pain is mainly back with radiation into the right leg more than left with twists or standing and extending back. Has a [place] catheter in place. Motor in the lower extremities shows Quadriceps weakness 4/5 and bilateral foot dorsiflexion weakness that is 3+/5. Plantar flexion is intact. SLR is negative. Reverse SLR is positive on the right side. MRI as above with moderately severe to severe spinal stenosis findings at the L3-4 level above an L4 to S1 fusion. There is effusion of the facets at L3-4. Soft tissue edema between the spinous processes of L3 and L4 consistent with interspinous process impingement and arthrosis. Mild spinal stenosis changes at L2-3 level with disc degeneration that is moderate. No anterolisthesis noted. Fusion appears solid.  Assessment/Plan: Superior adjacent level L3-4 severe spinal stenosis with bilateral leg weakness and bladder distension. Fusion L4 to S1 with retained hardware pedicle screws and rods and cages. Fusion about 10 years ago.  Degenerative disc disease L2-3 with mild spinal stenosis.  History of AVR 2018 with  indwelling bichamber pacemaker, cardiologist is  Dr. Geraldo Oliver  Plan: I will schedule Mr. Kercher to undergo an L2-3, L3-4 decompressive central laminectomy for severe lumbar spinal stenosis above the L4 to S1 fusion. This will relieve nerve compression and should result in improvement in leg pain, and neurogenic claudication symptoms. The difficulty with voiding may also improve. The risks of surgery include risks of infection 1 in 200 due to diabetes, risks related to heart which will be assessed by Cardiology. ( Dr. Audie Box on call for cardiology [facility] has been contacted and will see for preop clearance) risk of bleeding is  Minimal as I plan only a decompressive laminectomy without fusion.  I will schedule for Thursday late afternoon 01/08/2021. Hold all anticoagulants pre op .  [name] 01/07/2021, 7:48 PM

## 2021-01-07 NOTE — Telephone Encounter (Signed)
Patient's wife [name] is asking for a call back from [place]. She states her husband is in hospitalized and need to know what the next plan of action is for her husband. She is asking for Dr. Louanne Skye or Alyse Low to call her back with updates. Phone number is [phone].

## 2021-01-08 ENCOUNTER — Inpatient Hospital Stay (HOSPITAL_COMMUNITY): Payer: Medicare PPO | Admitting: Anesthesiology

## 2021-01-08 ENCOUNTER — Inpatient Hospital Stay (HOSPITAL_COMMUNITY): Payer: Medicare PPO

## 2021-01-08 ENCOUNTER — Encounter (HOSPITAL_COMMUNITY)
Admission: EM | Disposition: A | Discharge status: Home Health | Payer: Self-pay | Source: Home / Self Care | Attending: Internal Medicine

## 2021-01-08 ENCOUNTER — Observation Stay (HOSPITAL_COMMUNITY): Payer: Medicare PPO

## 2021-01-08 ENCOUNTER — Encounter (HOSPITAL_COMMUNITY): Payer: Self-pay | Admitting: Internal Medicine

## 2021-01-08 DIAGNOSIS — G8929 Other chronic pain: Secondary | ICD-10-CM | POA: Diagnosis present

## 2021-01-08 DIAGNOSIS — G43909 Migraine, unspecified, not intractable, without status migrainosus: Secondary | ICD-10-CM | POA: Diagnosis present

## 2021-01-08 DIAGNOSIS — E782 Mixed hyperlipidemia: Secondary | ICD-10-CM | POA: Diagnosis present

## 2021-01-08 DIAGNOSIS — N400 Enlarged prostate without lower urinary tract symptoms: Secondary | ICD-10-CM | POA: Diagnosis present

## 2021-01-08 DIAGNOSIS — Z981 Arthrodesis status: Secondary | ICD-10-CM | POA: Diagnosis not present

## 2021-01-08 DIAGNOSIS — M48062 Spinal stenosis, lumbar region with neurogenic claudication: Secondary | ICD-10-CM | POA: Diagnosis present

## 2021-01-08 DIAGNOSIS — D485 Neoplasm of uncertain behavior of skin: Secondary | ICD-10-CM | POA: Diagnosis present

## 2021-01-08 DIAGNOSIS — G4700 Insomnia, unspecified: Secondary | ICD-10-CM | POA: Diagnosis present

## 2021-01-08 DIAGNOSIS — K219 Gastro-esophageal reflux disease without esophagitis: Secondary | ICD-10-CM | POA: Diagnosis present

## 2021-01-08 DIAGNOSIS — M4326 Fusion of spine, lumbar region: Secondary | ICD-10-CM | POA: Diagnosis not present

## 2021-01-08 DIAGNOSIS — E11610 Type 2 diabetes mellitus with diabetic neuropathic arthropathy: Secondary | ICD-10-CM | POA: Diagnosis present

## 2021-01-08 DIAGNOSIS — E039 Hypothyroidism, unspecified: Secondary | ICD-10-CM | POA: Diagnosis present

## 2021-01-08 DIAGNOSIS — N3289 Other specified disorders of bladder: Secondary | ICD-10-CM | POA: Diagnosis present

## 2021-01-08 DIAGNOSIS — I442 Atrioventricular block, complete: Secondary | ICD-10-CM | POA: Diagnosis not present

## 2021-01-08 DIAGNOSIS — F32A Depression, unspecified: Secondary | ICD-10-CM | POA: Diagnosis present

## 2021-01-08 DIAGNOSIS — Z950 Presence of cardiac pacemaker: Secondary | ICD-10-CM | POA: Diagnosis not present

## 2021-01-08 DIAGNOSIS — I10 Essential (primary) hypertension: Secondary | ICD-10-CM | POA: Diagnosis present

## 2021-01-08 DIAGNOSIS — Z833 Family history of diabetes mellitus: Secondary | ICD-10-CM | POA: Diagnosis not present

## 2021-01-08 DIAGNOSIS — Z885 Allergy status to narcotic agent status: Secondary | ICD-10-CM | POA: Diagnosis not present

## 2021-01-08 DIAGNOSIS — F1120 Opioid dependence, uncomplicated: Secondary | ICD-10-CM | POA: Diagnosis present

## 2021-01-08 DIAGNOSIS — F419 Anxiety disorder, unspecified: Secondary | ICD-10-CM | POA: Diagnosis present

## 2021-01-08 DIAGNOSIS — T402X5A Adverse effect of other opioids, initial encounter: Secondary | ICD-10-CM | POA: Diagnosis present

## 2021-01-08 DIAGNOSIS — Z20822 Contact with and (suspected) exposure to covid-19: Secondary | ICD-10-CM | POA: Diagnosis present

## 2021-01-08 DIAGNOSIS — K5903 Drug induced constipation: Secondary | ICD-10-CM | POA: Diagnosis present

## 2021-01-08 DIAGNOSIS — M5116 Intervertebral disc disorders with radiculopathy, lumbar region: Secondary | ICD-10-CM | POA: Diagnosis present

## 2021-01-08 DIAGNOSIS — Z8261 Family history of arthritis: Secondary | ICD-10-CM | POA: Diagnosis not present

## 2021-01-08 HISTORY — DX: Spinal stenosis, lumbar region with neurogenic claudication: M48.062

## 2021-01-08 HISTORY — PX: LUMBAR LAMINECTOMY/DECOMPRESSION MICRODISCECTOMY: SHX5026

## 2021-01-08 LAB — GLUCOSE, CAPILLARY
Glucose-Capillary: 146 mg/dL — ABNORMAL HIGH (ref 70–99)
Glucose-Capillary: 128 mg/dL — ABNORMAL HIGH (ref 70–99)
Glucose-Capillary: 113 mg/dL — ABNORMAL HIGH (ref 70–99)
Glucose-Capillary: 133 mg/dL — ABNORMAL HIGH (ref 70–99)

## 2021-01-08 LAB — CBC WITH DIFFERENTIAL/PLATELET
Abs Immature Granulocytes: 0.05 10*3/uL (ref 0.00–0.07)
Basophils Absolute: 0.1 10*3/uL (ref 0.0–0.1)
Basophils Relative: 1 %
Eosinophils Absolute: 0.5 10*3/uL (ref 0.0–0.5)
Eosinophils Relative: 5 %
HCT: 43.8 % (ref 39.0–52.0)
Hemoglobin: 14.8 g/dL (ref 13.0–17.0)
Immature Granulocytes: 1 %
Lymphocytes Relative: 20 %
Lymphs Abs: 2 10*3/uL (ref 0.7–4.0)
MCH: 31.2 pg (ref 26.0–34.0)
MCHC: 33.8 g/dL (ref 30.0–36.0)
MCV: 92.2 fL (ref 80.0–100.0)
Monocytes Absolute: 0.6 10*3/uL (ref 0.1–1.0)
Monocytes Relative: 6 %
Neutro Abs: 6.9 10*3/uL (ref 1.7–7.7)
Neutrophils Relative %: 67 %
Platelets: 221 10*3/uL (ref 150–400)
RBC: 4.75 MIL/uL (ref 4.22–5.81)
RDW: 11.7 % (ref 11.5–15.5)
WBC: 10.1 10*3/uL (ref 4.0–10.5)
nRBC: 0 % (ref 0.0–0.2)

## 2021-01-08 LAB — TYPE AND SCREEN
ABO/RH(D): O POS
Antibody Screen: NEGATIVE

## 2021-01-08 LAB — BASIC METABOLIC PANEL
Anion gap: 10 (ref 5–15)
BUN: 14 mg/dL (ref 8–23)
CO2: 24 mmol/L (ref 22–32)
Calcium: 9.3 mg/dL (ref 8.9–10.3)
Chloride: 104 mmol/L (ref 98–111)
Creatinine, Ser: 0.92 mg/dL (ref 0.61–1.24)
GFR, Estimated: >60 mL/min (ref >60)
Glucose, Bld: 136 mg/dL — ABNORMAL HIGH (ref 70–99)
Potassium: 4.6 mmol/L (ref 3.5–5.1)
Sodium: 138 mmol/L (ref 135–145)

## 2021-01-08 LAB — PROTIME-INR
INR: 1 (ref 0.8–1.2)
Prothrombin Time: 13.5 seconds (ref 11.4–15.2)

## 2021-01-08 LAB — MRSA PCR SCREENING: MRSA by PCR: POSITIVE — AB

## 2021-01-08 LAB — APTT: aPTT: 29 seconds (ref 24–36)

## 2021-01-08 SURGERY — LUMBAR LAMINECTOMY/DECOMPRESSION MICRODISCECTOMY 2 LEVELS
Anesthesia: General | Site: Back

## 2021-01-08 MED ORDER — ONDANSETRON HCL 4 MG/2ML IJ SOLN
INTRAMUSCULAR | Status: AC
  Filled 2021-01-08: qty 6

## 2021-01-08 MED ORDER — PHENYLEPHRINE HCL-NACL 10-0.9 MG/250ML-% IV SOLN
INTRAVENOUS | Status: DC | PRN
  Administered 2021-01-08: 50 ug/min via INTRAVENOUS

## 2021-01-08 MED ORDER — ONDANSETRON HCL 4 MG/2ML IJ SOLN
INTRAMUSCULAR | Status: DC | PRN
  Administered 2021-01-08: 4 mg via INTRAVENOUS

## 2021-01-08 MED ORDER — PHENYLEPHRINE HCL (PRESSORS) 10 MG/ML IV SOLN
INTRAVENOUS | Status: DC | PRN
  Administered 2021-01-08 (×2): 80 ug via INTRAVENOUS

## 2021-01-08 MED ORDER — MIDAZOLAM HCL 2 MG/2ML IJ SOLN
INTRAMUSCULAR | Status: AC
  Filled 2021-01-08: qty 2

## 2021-01-08 MED ORDER — BUPIVACAINE LIPOSOME 1.3 % IJ SUSP
INTRAMUSCULAR | Status: AC
  Filled 2021-01-08: qty 20

## 2021-01-08 MED ORDER — FENTANYL CITRATE (PF) 250 MCG/5ML IJ SOLN
INTRAMUSCULAR | Status: AC
  Filled 2021-01-08: qty 5

## 2021-01-08 MED ORDER — BUPIVACAINE HCL 0.5 % IJ SOLN
INTRAMUSCULAR | Status: DC | PRN
  Administered 2021-01-08: 30 mL

## 2021-01-08 MED ORDER — PROPOFOL 10 MG/ML IV BOLUS
INTRAVENOUS | Status: DC | PRN
  Administered 2021-01-08: 100 mg via INTRAVENOUS

## 2021-01-08 MED ORDER — LACTATED RINGERS IV SOLN: INTRAVENOUS | Status: DC

## 2021-01-08 MED ORDER — THROMBIN 20000 UNITS EX SOLR
CUTANEOUS | Status: DC | PRN
  Administered 2021-01-08: 20 mL via TOPICAL

## 2021-01-08 MED ORDER — PROPOFOL 10 MG/ML IV BOLUS
INTRAVENOUS | Status: AC
  Filled 2021-01-08: qty 20

## 2021-01-08 MED ORDER — EPHEDRINE SULFATE 50 MG/ML IJ SOLN
INTRAMUSCULAR | Status: DC | PRN
  Administered 2021-01-08: 10 mg via INTRAVENOUS

## 2021-01-08 MED ORDER — SUGAMMADEX SODIUM 200 MG/2ML IV SOLN
INTRAVENOUS | Status: DC | PRN
  Administered 2021-01-08: 400 mg via INTRAVENOUS

## 2021-01-08 MED ORDER — PROMETHAZINE HCL 25 MG/ML IJ SOLN: 6.2500 mg | INTRAMUSCULAR | Status: DC | PRN

## 2021-01-08 MED ORDER — OXYCODONE HCL 5 MG/5ML PO SOLN: 5.0000 mg | Freq: Once | ORAL | Status: AC | PRN

## 2021-01-08 MED ORDER — CHLORHEXIDINE GLUCONATE 0.12 % MT SOLN
OROMUCOSAL | Status: AC
  Administered 2021-01-08: 15 mL via OROMUCOSAL
  Filled 2021-01-08: qty 15

## 2021-01-08 MED ORDER — HYDROMORPHONE HCL 1 MG/ML IJ SOLN
0.5000 mg | INTRAMUSCULAR | Status: DC | PRN
Start: 2021-01-08 — End: 2021-01-09
  Administered 2021-01-08 (×2): 0.5 mg via INTRAVENOUS

## 2021-01-08 MED ORDER — FENTANYL CITRATE (PF) 100 MCG/2ML IJ SOLN
25.0000 ug | INTRAMUSCULAR | Status: DC | PRN
  Administered 2021-01-08 (×3): 50 ug via INTRAVENOUS

## 2021-01-08 MED ORDER — ROCURONIUM BROMIDE 100 MG/10ML IV SOLN
INTRAVENOUS | Status: DC | PRN
  Administered 2021-01-08: 60 mg via INTRAVENOUS
  Administered 2021-01-08: 50 mg via INTRAVENOUS
  Administered 2021-01-08: 40 mg via INTRAVENOUS

## 2021-01-08 MED ORDER — CHLORHEXIDINE GLUCONATE 4 % EX LIQD
CUTANEOUS | Status: AC
  Filled 2021-01-08: qty 45

## 2021-01-08 MED ORDER — KETAMINE HCL 50 MG/5ML IJ SOSY
PREFILLED_SYRINGE | INTRAMUSCULAR | Status: AC
  Filled 2021-01-08: qty 5

## 2021-01-08 MED ORDER — FENTANYL CITRATE (PF) 100 MCG/2ML IJ SOLN
INTRAMUSCULAR | Status: AC
  Filled 2021-01-08: qty 2

## 2021-01-08 MED ORDER — STERILE WATER FOR IRRIGATION IR SOLN
Status: DC | PRN
  Administered 2021-01-08: 1000 mL

## 2021-01-08 MED ORDER — OXYCODONE HCL 5 MG PO TABS
5.0000 mg | ORAL_TABLET | Freq: Once | ORAL | Status: AC | PRN
  Administered 2021-01-08: 5 mg via ORAL

## 2021-01-08 MED ORDER — HYDROMORPHONE HCL 1 MG/ML IJ SOLN
1.0000 mg | INTRAMUSCULAR | Status: DC | PRN
  Administered 2021-01-08 – 2021-01-13 (×12): 1 mg via INTRAVENOUS
  Filled 2021-01-08 (×12): qty 1

## 2021-01-08 MED ORDER — 0.9 % SODIUM CHLORIDE (POUR BTL) OPTIME
TOPICAL | Status: DC | PRN
  Administered 2021-01-08: 1000 mL

## 2021-01-08 MED ORDER — EPHEDRINE 5 MG/ML INJ
INTRAVENOUS | Status: AC
  Filled 2021-01-08: qty 30

## 2021-01-08 MED ORDER — ROCURONIUM BROMIDE 10 MG/ML (PF) SYRINGE
PREFILLED_SYRINGE | INTRAVENOUS | Status: AC
  Filled 2021-01-08: qty 50

## 2021-01-08 MED ORDER — BUPIVACAINE HCL (PF) 0.5 % IJ SOLN
INTRAMUSCULAR | Status: AC
  Filled 2021-01-08: qty 30

## 2021-01-08 MED ORDER — OXYCODONE HCL 5 MG PO TABS
ORAL_TABLET | ORAL | Status: AC
  Filled 2021-01-08: qty 1

## 2021-01-08 MED ORDER — FENTANYL CITRATE (PF) 100 MCG/2ML IJ SOLN
INTRAMUSCULAR | Status: DC | PRN
  Administered 2021-01-08: 100 ug via INTRAVENOUS
  Administered 2021-01-08: 50 ug via INTRAVENOUS

## 2021-01-08 MED ORDER — HYDROMORPHONE HCL 1 MG/ML IJ SOLN
INTRAMUSCULAR | Status: AC
  Filled 2021-01-08: qty 1

## 2021-01-08 MED ORDER — DEXAMETHASONE SODIUM PHOSPHATE 10 MG/ML IJ SOLN
INTRAMUSCULAR | Status: DC | PRN
  Administered 2021-01-08: 5 mg via INTRAVENOUS

## 2021-01-08 MED ORDER — CHLORHEXIDINE GLUCONATE 0.12% ORAL RINSE (MEDLINE KIT): 15.0000 mL | OROMUCOSAL | Status: AC

## 2021-01-08 MED ORDER — BUPIVACAINE LIPOSOME 1.3 % IJ SUSP
INTRAMUSCULAR | Status: DC | PRN
  Administered 2021-01-08: 20 mL

## 2021-01-08 MED ORDER — LIDOCAINE HCL (CARDIAC) PF 100 MG/5ML IV SOSY
PREFILLED_SYRINGE | INTRAVENOUS | Status: DC | PRN
  Administered 2021-01-08: 60 mg via INTRAVENOUS

## 2021-01-08 MED ORDER — THROMBIN 20000 UNITS EX SOLR
CUTANEOUS | Status: AC
  Filled 2021-01-08: qty 20000

## 2021-01-08 MED ORDER — PHENYLEPHRINE 40 MCG/ML (10ML) SYRINGE FOR IV PUSH (FOR BLOOD PRESSURE SUPPORT)
PREFILLED_SYRINGE | INTRAVENOUS | Status: AC
  Filled 2021-01-08: qty 20

## 2021-01-08 MED ORDER — DEXAMETHASONE SODIUM PHOSPHATE 10 MG/ML IJ SOLN
INTRAMUSCULAR | Status: AC
  Filled 2021-01-08: qty 3

## 2021-01-08 MED ORDER — KETAMINE HCL 10 MG/ML IJ SOLN
INTRAMUSCULAR | Status: DC | PRN
  Administered 2021-01-08: 30 mg via INTRAVENOUS
  Administered 2021-01-08: 10 mg via INTRAVENOUS

## 2021-01-08 MED ORDER — LIDOCAINE 2% (20 MG/ML) 5 ML SYRINGE
INTRAMUSCULAR | Status: AC
  Filled 2021-01-08: qty 20

## 2021-01-08 MED ORDER — ALBUMIN HUMAN 5 % IV SOLN: INTRAVENOUS | Status: DC | PRN

## 2021-01-08 MED ORDER — MIDAZOLAM HCL 5 MG/5ML IJ SOLN
INTRAMUSCULAR | Status: DC | PRN
  Administered 2021-01-08: 2 mg via INTRAVENOUS

## 2021-01-08 SURGICAL SUPPLY — 56 items
BUR SABER RD CUTTING 3.0 (BURR) ×1 IMPLANT
CANISTER SUCT 3000ML PPV (MISCELLANEOUS) ×1 IMPLANT
CLEANER TIP ELECTROSURG 2X2 (MISCELLANEOUS) ×1 IMPLANT
CLSR STERI-STRIP ANTIMIC 1/2X4 (GAUZE/BANDAGES/DRESSINGS) ×1 IMPLANT
COVER SURGICAL LIGHT HANDLE (MISCELLANEOUS) ×2 IMPLANT
COVER WAND RF STERILE (DRAPES) ×1 IMPLANT
DERMABOND ADVANCED (GAUZE/BANDAGES/DRESSINGS)
DERMABOND ADVANCED .7 DNX12 (GAUZE/BANDAGES/DRESSINGS) ×1 IMPLANT
DRAPE HALF SHEET 40X57 (DRAPES) IMPLANT
DRAPE INCISE IOBAN 66X45 STRL (DRAPES) IMPLANT
DRAPE MICROSCOPE LEICA (MISCELLANEOUS) ×2 IMPLANT
DRAPE SURG 17X23 STRL (DRAPES) ×8 IMPLANT
DRESSING MEPILEX FLEX 4X4 (GAUZE/BANDAGES/DRESSINGS) IMPLANT
DRSG MEPILEX BORDER 4X4 (GAUZE/BANDAGES/DRESSINGS) IMPLANT
DRSG MEPILEX BORDER 4X8 (GAUZE/BANDAGES/DRESSINGS) IMPLANT
DRSG MEPILEX FLEX 4X4 (GAUZE/BANDAGES/DRESSINGS) ×2
DURAPREP 26ML APPLICATOR (WOUND CARE) ×2 IMPLANT
ELECT REM PT RETURN 9FT ADLT (ELECTROSURGICAL) ×2
ELECTRODE REM PT RTRN 9FT ADLT (ELECTROSURGICAL) ×1 IMPLANT
EVACUATOR 1/8 PVC DRAIN (DRAIN) IMPLANT
GLOVE ECLIPSE 9.0 STRL (GLOVE) ×2 IMPLANT
GLOVE ORTHO TXT STRL SZ7.5 (GLOVE) ×2 IMPLANT
GLOVE SRG 8 PF TXTR STRL LF DI (GLOVE) ×1 IMPLANT
GLOVE SURG 8.5 LATEX PF (GLOVE) ×2 IMPLANT
GLOVE SURG UNDER POLY LF SZ7.5 (GLOVE) ×1 IMPLANT
GLOVE SURG UNDER POLY LF SZ8 (GLOVE) ×2
GOWN STRL REUS W/ TWL LRG LVL3 (GOWN DISPOSABLE) ×1 IMPLANT
GOWN STRL REUS W/ TWL XL LVL3 (GOWN DISPOSABLE) IMPLANT
GOWN STRL REUS W/TWL 2XL LVL3 (GOWN DISPOSABLE) ×3 IMPLANT
GOWN STRL REUS W/TWL LRG LVL3 (GOWN DISPOSABLE) ×2
GOWN STRL REUS W/TWL XL LVL3 (GOWN DISPOSABLE) ×4
KIT BASIN OR (CUSTOM PROCEDURE TRAY) ×2 IMPLANT
KIT TURNOVER KIT B (KITS) ×2 IMPLANT
MANIFOLD NEPTUNE II (INSTRUMENTS) ×1 IMPLANT
NDL SPNL 18GX3.5 QUINCKE PK (NEEDLE) ×2 IMPLANT
NEEDLE SPNL 18GX3.5 QUINCKE PK (NEEDLE) ×4 IMPLANT
NS IRRIG 1000ML POUR BTL (IV SOLUTION) ×2 IMPLANT
PACK LAMINECTOMY ORTHO (CUSTOM PROCEDURE TRAY) ×2 IMPLANT
PAD ARMBOARD 7.5X6 YLW CONV (MISCELLANEOUS) ×4 IMPLANT
PATTIES SURGICAL .5 X.5 (GAUZE/BANDAGES/DRESSINGS) ×1 IMPLANT
PATTIES SURGICAL .75X.75 (GAUZE/BANDAGES/DRESSINGS) IMPLANT
PATTIES SURGICAL 1X1 (DISPOSABLE) IMPLANT
SPONGE LAP 4X18 RFD (DISPOSABLE) IMPLANT
SPONGE SURGIFOAM ABS GEL 100 (HEMOSTASIS) ×1 IMPLANT
SUT VIC AB 0 CT1 27 (SUTURE)
SUT VIC AB 0 CT1 27XBRD ANBCTR (SUTURE) IMPLANT
SUT VIC AB 1 CT1 27 (SUTURE) ×2
SUT VIC AB 1 CT1 27XBRD ANBCTR (SUTURE) IMPLANT
SUT VIC AB 2-0 CT1 27 (SUTURE) ×2
SUT VIC AB 2-0 CT1 TAPERPNT 27 (SUTURE) IMPLANT
SUT VIC AB 3-0 X1 27 (SUTURE) ×2 IMPLANT
SUT VICRYL 0 UR6 27IN ABS (SUTURE) ×3 IMPLANT
TOWEL GREEN STERILE (TOWEL DISPOSABLE) ×2 IMPLANT
TOWEL GREEN STERILE FF (TOWEL DISPOSABLE) ×2 IMPLANT
TRAY FOLEY MTR SLVR 16FR STAT (SET/KITS/TRAYS/PACK) IMPLANT
WATER STERILE IRR 1000ML POUR (IV SOLUTION) ×2 IMPLANT

## 2021-01-08 NOTE — OR Nursing (Signed)
Exparel bracelet applied to Right wrist in PACU by PACU, RN.

## 2021-01-08 NOTE — Interval H&P Note (Signed)
History and Physical Interval Note:  01/08/2021 5:45 PM  Antonio Oliver  has presented today for surgery, with the diagnosis of Severe Spinal Stenosis, Bilateral Leg weakness.  The various methods of treatment have been discussed with the patient and family. After consideration of risks, benefits and other options for treatment, the patient has consented to  Procedure(s): CENTRAL LUMBAR LAMINECTOMY LUMBAR TWO-THREE, LUMBAR THREE-FOUR (N/A) as a surgical intervention.  The patient's history has been reviewed, patient examined, no change in status, stable for surgery.  I have reviewed the patient's chart and labs.  Questions were answered to the patient's satisfaction.     [name]

## 2021-01-08 NOTE — Anesthesia Procedure Notes (Signed)
Procedure Name: Intubation Date/Time: 01/08/2021 7:30 PM Performed by: Katalena Malveaux T, CRNA Pre-anesthesia Checklist: Patient identified, Emergency Drugs available, Suction available and Patient being monitored Patient Re-evaluated:Patient Re-evaluated prior to induction Oxygen Delivery Method: Circle system utilized Preoxygenation: Pre-oxygenation with 100% oxygen Induction Type: IV induction Ventilation: Mask ventilation without difficulty Laryngoscope Size: Glidescope and 3 Grade View: Grade I Tube type: Oral Tube size: 7.5 mm Number of attempts: 1 Airway Equipment and Method: Stylet and Oral airway Placement Confirmation: ETT inserted through vocal cords under direct vision,  positive ETCO2 and breath sounds checked- equal and bilateral Secured at: 22 cm Tube secured with: Tape Dental Injury: Teeth and Oropharynx as per pre-operative assessment  Difficulty Due To: Difficulty was anticipated, Difficult Airway- due to limited oral opening and Difficult Airway- due to reduced neck mobility Future Recommendations: Recommend- induction with short-acting agent, and alternative techniques readily available

## 2021-01-08 NOTE — Progress Notes (Signed)
OT Cancellation Note  Patient Details Name: Antonio Oliver MRN: [id number] DOB: 1955-10-16   Cancelled Treatment:    Reason Eval/Treat Not Completed: Pain limiting ability to participate;Fatigue/lethargy limiting ability to participate;Patient declined, no reason specified (Pt scheduled for back sx this afternoon 01/08/21, is NPO at this time, pt declined any movement with therapy due to pain and fatigue. Informed pt and wife that OT will evaluate him tomorrow am 01/09/21 after back sx.)  Bellami Farrelly A Tijah Hane 01/08/2021, 11:31 AM

## 2021-01-08 NOTE — Transfer of Care (Signed)
Immediate Anesthesia Transfer of Care Note  Patient: Antonio Oliver  Procedure(s) Performed: CENTRAL LUMBAR LAMINECTOMY LUMBAR TWO-THREE, LUMBAR THREE-FOUR (N/A )  Patient Location: PACU  Anesthesia Type:General  Level of Consciousness: awake, alert  and oriented  Airway & Oxygen Therapy: Patient Spontanous Breathing and Patient connected to nasal cannula oxygen  Post-op Assessment: Report given to RN, Post -op Vital signs reviewed and stable and Patient moving all extremities  Post vital signs: Reviewed and stable  Last Vitals:  Vitals Value Taken Time  BP 145/92 01/08/21 2219  Temp    Pulse 75 01/08/21 2224  Resp 20 01/08/21 2224  SpO2 100 % 01/08/21 2224  Vitals shown include unvalidated device data.  Last Pain:  Vitals:   01/08/21 1510  TempSrc: Oral  PainSc:       Patients Stated Pain Goal: 3 (62/26/33 [id number])  Complications: No complications documented.

## 2021-01-08 NOTE — Progress Notes (Signed)
[employer] pacemaker interrogated in PACU.  Transmission confirmed with Phoenix Harrison.

## 2021-01-08 NOTE — Op Note (Signed)
01/05/2021 - 01/08/2021  10:02 PM  PATIENT:  Antonio Oliver  65 y.o. male  MRN: [id number]  OPERATIVE REPORT  PRE-OPERATIVE DIAGNOSIS:  Severe Spinal Stenosis, Bilateral Leg weakness  POST-OPERATIVE DIAGNOSIS:  Severe Spinal Stenosis, Bilateral Leg weakness  PROCEDURE:  Procedure(s): CENTRAL LUMBAR LAMINECTOMY LUMBAR TWO-THREE, LUMBAR THREE-FOUR    SURGEON:  Jessy Oto, MD     ASSISTANT:  Benjiman Core, PA-C  (Present throughout the entire procedure and necessary for completion of procedure in a timely manner)     ANESTHESIA:  General,    COMPLICATIONS:  None.     EBL:250CC  DRAINS:None  PROCEDURE: The patient was met in the holding area, and the appropriate L3-4 and  L2-3 levels identified and marked with an "X" and my initials since this was a bilateral procedure. The patient was then transported to OR and was placed under general anesthesia without difficulty. Then placed on the operative table in a prone position. The [facility] spine OR table was used. The patient received appropriate preoperative antibiotic prophylaxis. Standard prep with DuraPrep solution from the mid dorsal spine to the mid sacral level.Time-out procedure was called and correct .  Patient was draped in the usual manner. Iodine Vi-Drape was used. Incision made at the expected L4 level and continued superiorly elipsing the old incision scar. Kocher clam  placed at the expected L2-3 interspinous process level. Intraoperative lateral xray demonstrated the Kocher placed posterior to the L2-3 level. Initial incisions were made at L2 and at the lowest aspect of the incision at the expected level of L4 approximately 2 1/2inches in length. The incision were infiltrated with marcaine 1/2% plain:exparel 1.3% 1:1. Total of 10 cc were used. Skin subcutaneous layers divide down to the lumbodorsal fascia this was incised in both  L2-3, L3-4 and L4 spinous processes. The L3-4 to L2-3 levels were then exposed using Cobb elevators and  electrocautery. These areas were then packed. Boss McCollough retractor was inserted at the incision site. Leksell rongeur used to remove a portion of the inferior aspect of the spinous process of L2 40% and the entire spinous processes of L3 and superior 30% of L4. Leksell rongeur was then used to further remove bone down to the ligamentum flavum and the central portions of the lamina and base of the spinous process were thinned. 4 mm burr was also used to thin the lamina of L3 centrally and the inferior 50% of the lamina of L2. The facets were then exposed out laterally and were hypertrophic.Osteotomes then used medial aspect of the L3-4 facets and L2-3 facet bilaterally resecting approximately 10-15% of the facet bilaterally. Kerrisons were then used to resect central portions of the lamina of L3 and the inferior 50% of L2 lamina. Ligamentum flavum at the L3-4 and L2-3 levels were then carefully resected centrally preserving at least 7-8 mm with at the pars level L2 and L3. The central laminectomy was further widened performing more resection of the medial aspect of facet at both L3-4 and L2-3. Note that loupe magnification and headlight was used for this portion procedure.the central portions of the ligamentum flavum at the L3-4 level were resected and the medial aspect of the L3-4 facet resected over about 5-10%. Hypertrophic flava was found to be present. The operating room microscope sterilely draped brought into the field and then carefully the right side decompressed at each level beginning at the right L4 neuroforamen resect bone over the superior lamina of L4 and decompressing the right L4 foramen and  reflected portion ligamentum flavum off the medial aspect of the right L3-4 facet. Hockey-stick nerve probe could be passed out both the L4 and L3 neuroforamen. The medial aspect of facet at the L2-3 level was then carefully evaluated and hypertrophic ligamentum flavum resected using Kerrisons decompressing  the lateral recess on this right side. This was done such that hockey-stick nerve probe could be used to pass the outer recess demonstrating patency and decompression of the L2 nerve root and L3 nerve roots. Attention then turned to the L3-4 were further debridement of the lateral recess and hypertrophic ligamentum flavum and reflected portions of ligamentum flavum were carried out. Changing sides of the OR table in decompression was carried out in similar fashion on the left side from the left L4 nerve root to the L3-4 level. At the left L3-4 level severe lateral recess stenosis was noted lateral recess was decompressed and the L4 nerve root freed up.  Further decompression was then carried out the left side of the spinal canal decompressing the L2, the L3 and the L4 nerve roots. Following this then a hockey-stick nerve probe could be passed out easily bilateral L2,  L3 and L4 neuroforamen. Following decompression bilaterally thrombin-soaked Gelfoam was applied to the laminotomy defects and these areas packed with small sponges. Adequate hemostasis was obtained at all levels. The thrombin-soaked Gelfoam was then removed and a medium. The incision showed no active bleeding present.  Boss McCollough retractor was then removed. The end of the case, an approximately 15 mm central laminotomy was present with normal pulsation of the thecal sac present. Following additional irrigation and with no active bleeding present at any level level, the incision was then closed by first approximating the lumbar muscles the midline with interrupted #1 Vicryl sutures loosely the lumbodorsal fascia then approximated with interrupted #1 Vicryl sutures.Deep subcutaneous layers approximated with interrupted #0 Vicryl suture more superficial layers with interrupted 2-0 Vicryl suture the skin closed with a running subcutaneous stitch of 3-0 Vicryl. Antibiotic steristrips were applied to the skin margins.  A mepilex bandage was applied to  the midline incision site. All instrument and sponge counts were correct. Patient was then reactivated returned to supine position and extubated. He was then returned to recovery room in satisfactory condition.            [name]  01/08/2021, 10:02 PM

## 2021-01-08 NOTE — Anesthesia Preprocedure Evaluation (Addendum)
Anesthesia Evaluation  Patient identified by MRN, date of birth, ID band Patient awake    Reviewed: Allergy & Precautions, H&P , NPO status , Patient's Chart, lab work & pertinent test results  History of Anesthesia Complications Negative for: history of anesthetic complications  Airway Mallampati: II  TM Distance: >3 FB Neck ROM: Full    Dental no notable dental hx. (+) Teeth Intact, Dental Advisory Given   Pulmonary neg pulmonary ROS,    Pulmonary exam normal breath sounds clear to auscultation       Cardiovascular hypertension, Pt. on medications and On Home Beta Blockers + dysrhythmias (CHB) + pacemaker + Valvular Problems/Murmurs (s/p AVR) MR  Rhythm:Regular Rate:Normal   '22 TTE - EF 60 to 65%. Mild concentric left ventricular  hypertrophy. Moderate mitral valve regurgitation. Well seated bioprosthetic aortic valve. Aneurysm of the ascending aorta, measuring 44 mm. The abdominal aorta is dilated (2.5 cm).    Neuro/Psych  Headaches, PSYCHIATRIC DISORDERS Anxiety Depression  Severe spinal stenosis w/ b/l LE weakness     GI/Hepatic Neg liver ROS, GERD  Medicated, S/p gastric bypass    Endo/Other  diabetes, Type 2, Oral Hypoglycemic AgentsHypothyroidism   Renal/GU negative Renal ROS  negative genitourinary   Musculoskeletal  (+) Arthritis , narcotic dependent  Abdominal   Peds  Hematology negative hematology ROS (+)   Anesthesia Other Findings   Reproductive/Obstetrics negative OB ROS                            Anesthesia Physical Anesthesia Plan  ASA: III  Anesthesia Plan: General   Post-op Pain Management:    Induction: Intravenous  PONV Risk Score and Plan: 3 and Treatment may vary due to age or medical condition, Ondansetron, Dexamethasone and Midazolam  Airway Management Planned: Oral ETT  Additional Equipment: None  Intra-op Plan:   Post-operative Plan:  Extubation in OR  Informed Consent: I have reviewed the patients History and Physical, chart, labs and discussed the procedure including the risks, benefits and alternatives for the proposed anesthesia with the patient or authorized representative who has indicated his/her understanding and acceptance.     Dental advisory given  Plan Discussed with: CRNA and Anesthesiologist  Anesthesia Plan Comments:        Anesthesia Quick Evaluation

## 2021-01-08 NOTE — Brief Op Note (Signed)
01/05/2021 - 01/08/2021  9:59 PM  PATIENT:  Antonio Oliver  65 y.o. male  PRE-OPERATIVE DIAGNOSIS:  Severe Spinal Stenosis, Bilateral Leg weakness  POST-OPERATIVE DIAGNOSIS:  Severe Spinal Stenosis, Bilateral Leg weakness  PROCEDURE:  Procedure(s): CENTRAL LUMBAR LAMINECTOMY LUMBAR TWO-THREE, LUMBAR THREE-FOUR (N/A)  SURGEON:  Surgeon(s) and Role:    * Jessy Oto, MD - Primary  PHYSICIAN ASSISTANT: None  ANESTHESIA:   local and general  EBL:  200 mL   BLOOD ADMINISTERED:none  DRAINS: none   LOCAL MEDICATIONS USED:  MARCAINE 0.5% 1:1 EXPAREL 1.3% Amount: 10 ml  SPECIMEN:  No Specimen  DISPOSITION OF SPECIMEN:  N/A  COUNTS:  YES  TOURNIQUET:  * No tourniquets in log *  DICTATION: .Dragon Dictation  PLAN OF CARE: Admit to inpatient   PATIENT DISPOSITION:  PACU - hemodynamically stable.   Delay start of Pharmacological VTE agent (>24hrs) due to surgical blood loss or risk of bleeding: yes

## 2021-01-08 NOTE — Progress Notes (Signed)
PROGRESS NOTE    Antonio Oliver   UYQ:[id number]  DOB: September 21, 1955  PCP: Cher Nakai, MD    DOA: 01/05/2021 LOS: 0   Brief Narrative   As per H&P written by Dr. Marylyn Ishihara 01/05/2021 "Antonio Oliver is a 65 y.o. male with medical history significant of DM2, HTN, HLD, neck surgery. Presenting with back pain and right lower extremity weakness. He reports that he woke up 5 days ago and could not walk. He went to an [facility] where he had a w/u that ruled out stroke. However, he was unable to get an MRI d/t a pacer. It was recommended that he follow up with a back specialist. He did regain some strength and ability to mobilize. So he followed up with his previous back specialist (Dr. Louanne Skye) who ordered a MRI of his back to be completed on 5/10. This morning he woke up with worsening back pain and the inability to walk again. The pain was in his lower back and radiating down his right hip into his right leg. It was a burning pain that turned sharp with movement. He tried his percocet, but it didn't help. He spoke with his back team. It was recommended that he come to the ED. He denies any other aggravating or alleviating factors. "  Patient transferred to [facility] due to need for MRI with pacemaker present, and to be seen and evaluated by his back surgeon as well.  MRI Lumbar Spine IMPRESSION: 1. Since previous MRI of 2008, the patient has undergone laminectomy and PLIF from L4 through S1. The spinal canal and neural foramina are well decompressed at the operative levels, and the interbody fusion appears solid. 2. Interval development of adjacent segment disease at L3-4 with advanced bilateral facet hypertrophy contributing to moderate to severe multifactorial spinal stenosis. 3. Progressive disc and endplate degeneration asymmetric to the right at L2-3 with mild spinal stenosis and mild right foraminal narrowing. 4. Moderate bladder distension without demonstrated cause.     Assessment & Plan    Active Problems:   Back pain   Lumbar stenosis with neurogenic claudication   Low back pain with right lower extremity radiculopathy Hx of prior back surgery, now with acute worsening several days ago.  5/4 has ongoing severe pain.   MRI lumbar spine showed  --Ortho consulted, Dr. Louanne Skye planning for surgery later this afternoon  --Pain control per orders --Continue gabapentin and Cymbalta, Zalaflex --PT and OT as able  Essential hypertension - chronic, intermittently high likely due to pain.  Continue metoprolol.  Hyperlipidemia - continue atorvastatin  Type 2 diabetes - sliding scale Novolog  History of BPH -  Continue Proscar.  No reports of acute retention.   Anxiety/depression/Insomnia- stable.  Continue Wellbutrin, Rexulti, Klonopin PRN, Cymbalta, Remeron  Hx of migraines - continue Imitrex PRN  Hypothyroidism - continue levothyroxine  Patient BMI: Body mass index is 24.87 kg/m.   DVT prophylaxis: Sequential compression device to OR Start: 01/07/21 2349 SCDs Start: 01/05/21 1602   Diet:  Diet Orders (From admission, onward)    Start     Ordered   01/08/21 1300  Diet NPO time specified  Diet effective ____        01/07/21 2348            Code Status: Partial Code    Subjective 01/08/21    Pt awake sitting up in bed when seen.  He reports ongoing severe back pain intermittently radiating to the legs.  Reports no  sleep overnight, awake due to pain.  Given meds about 2 hours ago and still has 9 out of 10 pain at this time.   Disposition Plan & Communication   Status is: Inpatient  Inpatient appropriate due to severity of illness, orthopedic surgery is planned.  Dispo: The patient is from: home              Anticipated d/c is to: CIR              Patient currently not medically stable for discharge   Difficult to place patient not expected   Family Communication: wife at bedside on rounds 5/4   Consults, Procedures, Significant Events    Consultants:   Orthopedics, Dr. Louanne Skye  Procedures:   none  Antimicrobials:  Anti-infectives (From admission, onward)   Start     Dose/Rate Route Frequency Ordered Stop   01/08/21 1600  ceFAZolin (ANCEF) IVPB 2g/100 mL premix        2 g 200 mL/hr over 30 Minutes Intravenous To Short Stay 01/07/21 2348 01/09/21 1600        Micro    Objective   Vitals:   01/07/21 2109 01/08/21 0000 01/08/21 0635 01/08/21 1510  BP: (!) 140/96 (!) 137/91 (!) 146/100 (!) 148/104  Pulse: 66 64 66 70  Resp: 20 16 17 17   Temp: 98.1 F (36.7 C) 98 F (36.7 C) 98.1 F (36.7 C) 97.7 F (36.5 C)  TempSrc:  Oral Oral Oral  SpO2: 95% 96% 96% 96%  Weight:      Height:       No intake or output data in the 24 hours ending 01/08/21 1629 Filed Weights   01/05/21 1200  Weight: 90.3 kg    Physical Exam:  General exam: awake, alert, no acute distress Respiratory system: normal respiratory effort, on room air. Cardiovascular system: regular rate rhythm, no peripheral edema.   Central nervous system: A&O x3. no gross focal neurologic deficits, normal speech Extremities: moves all, no edema, no cyanosis   Labs   Data Reviewed: I have personally reviewed following labs and imaging studies  CBC: Recent Labs  Lab 01/05/21 1233 01/06/21 0414 01/08/21 0444  WBC 8.4 7.5 10.1  NEUTROABS 4.9  --  6.9  HGB 13.9 13.2 14.8  HCT 40.6 39.0 43.8  MCV 94.4 92.4 92.2  PLT 241 188 338   Basic Metabolic Panel: Recent Labs  Lab 01/05/21 1233 01/06/21 0414 01/08/21 0444  NA 141 139 138  K 4.5 4.3 4.6  CL 108 106 104  CO2 25 26 24   GLUCOSE 128* 129* 136*  BUN 12 10 14   CREATININE 0.89 1.00 0.92  CALCIUM 9.4 9.1 9.3   GFR: Estimated Creatinine Clearance: 98.2 mL/min (by C-G formula based on SCr of 0.92 mg/dL). Liver Function Tests: Recent Labs  Lab 01/06/21 0414  AST 23  ALT 49*  ALKPHOS 60  BILITOT 0.8  PROT 5.4*  ALBUMIN 3.1*   No results for input(s): LIPASE, AMYLASE in the  last 168 hours. No results for input(s): AMMONIA in the last 168 hours. Coagulation Profile: Recent Labs  Lab 01/08/21 0444  INR 1.0   Cardiac Enzymes: No results for input(s): CKTOTAL, CKMB, CKMBINDEX, TROPONINI in the last 168 hours. BNP (last 3 results) No results for input(s): PROBNP in the last 8760 hours. HbA1C: No results for input(s): HGBA1C in the last 72 hours. CBG: Recent Labs  Lab 01/07/21 1130 01/07/21 1611 01/07/21 2124 01/08/21 0650 01/08/21 1312  GLUCAP 165* 177*  138* 146* 128*   Lipid Profile: No results for input(s): CHOL, HDL, LDLCALC, TRIG, CHOLHDL, LDLDIRECT in the last 72 hours. Thyroid Function Tests: No results for input(s): TSH, T4TOTAL, FREET4, T3FREE, THYROIDAB in the last 72 hours. Anemia Panel: No results for input(s): VITAMINB12, FOLATE, FERRITIN, TIBC, IRON, RETICCTPCT in the last 72 hours. Sepsis Labs: No results for input(s): PROCALCITON, LATICACIDVEN in the last 168 hours.  Recent Results (from the past 240 hour(s))  Resp Panel by RT-PCR (Flu A&B, Covid) Nasopharyngeal Swab     Status: None   Collection Time: 01/05/21  1:35 PM   Specimen: Nasopharyngeal Swab; Nasopharyngeal(NP) swabs in vial transport medium  Result Value Ref Range Status   SARS Coronavirus 2 by RT PCR NEGATIVE NEGATIVE Final    Comment: (NOTE) SARS-CoV-2 target nucleic acids are NOT DETECTED.  The SARS-CoV-2 RNA is generally detectable in upper respiratory specimens during the acute phase of infection. The lowest concentration of SARS-CoV-2 viral copies this assay can detect is 138 copies/mL. A negative result does not preclude SARS-Cov-2 infection and should not be used as the sole basis for treatment or other patient management decisions. A negative result may occur with  improper specimen collection/handling, submission of specimen other than nasopharyngeal swab, presence of viral mutation(s) within the areas targeted by this assay, and inadequate number of  viral copies(<138 copies/mL). A negative result must be combined with clinical observations, patient history, and epidemiological information. The expected result is Negative.  Fact Sheet for Patients:  EntrepreneurPulse.com.au  Fact Sheet for Healthcare Providers:  IncredibleEmployment.be  This test is no t yet approved or cleared by the [place] FDA and  has been authorized for detection and/or diagnosis of SARS-CoV-2 by FDA under an Emergency Use Authorization (EUA). This EUA will remain  in effect (meaning this test can be used) for the duration of the COVID-19 declaration under Section 564(b)(1) of the Act, 21 U.S.C.section 360bbb-3(b)(1), unless the authorization is terminated  or revoked sooner.       Influenza A by PCR NEGATIVE NEGATIVE Final   Influenza B by PCR NEGATIVE NEGATIVE Final    Comment: (NOTE) The Xpert Xpress SARS-CoV-2/FLU/RSV plus assay is intended as an aid in the diagnosis of influenza from Nasopharyngeal swab specimens and should not be used as a sole basis for treatment. Nasal washings and aspirates are unacceptable for Xpert Xpress SARS-CoV-2/FLU/RSV testing.  Fact Sheet for Patients: EntrepreneurPulse.com.au  Fact Sheet for Healthcare Providers: IncredibleEmployment.be  This test is not yet approved or cleared by the [place] FDA and has been authorized for detection and/or diagnosis of SARS-CoV-2 by FDA under an Emergency Use Authorization (EUA). This EUA will remain in effect (meaning this test can be used) for the duration of the COVID-19 declaration under Section 564(b)(1) of the Act, 21 U.S.C. section 360bbb-3(b)(1), unless the authorization is terminated or revoked.  Performed at [facility], [place] [address] [place], [place] [address]   MRSA PCR Screening     Status: Abnormal   Collection Time: 01/08/21  5:43 AM   Specimen: Nasal  Mucosa; Nasopharyngeal  Result Value Ref Range Status   MRSA by PCR POSITIVE (A) NEGATIVE Final    Comment:        The GeneXpert MRSA Assay (FDA approved for NASAL specimens only), is one component of a comprehensive MRSA colonization surveillance program. It is not intended to diagnose MRSA infection nor to guide or monitor treatment for MRSA infections. RESULT CALLED TO, READ BACK BY AND VERIFIED WITH:  Julianne Handler RN 01/08/21 0654 JDW Performed at [facility] [address], [place], [place] [address]       Imaging Studies   DG Lumbar Spine 2-3 Views  Result Date: 01/08/2021 CLINICAL DATA:  Neurogenic claudication EXAM: LUMBAR SPINE - 2-3 VIEW COMPARISON:  12/31/2020 FINDINGS: L4-S1 PLIF. No hardware abnormality. Mild height loss at L1 and L2 is unchanged. Normal alignment. IMPRESSION: Unchanged appearance of L4-S1 PLIF. Electronically Signed   By: Ulyses Jarred M.D.   On: 01/08/2021 02:05     Medications   Scheduled Meds: . atorvastatin  20 mg Oral q1800  . brexpiprazole  2 mg Oral Daily  . buPROPion  200 mg Oral BID  . busPIRone  20 mg Oral BID  . chlorhexidine      . darifenacin  7.5 mg Oral Daily  . DULoxetine  60 mg Oral BID  . finasteride  5 mg Oral Daily  . folic acid  1 mg Oral Daily  . gabapentin  300 mg Oral To SSTC  . gabapentin  600-1,200 mg Oral TID  . insulin aspart  0-15 Units Subcutaneous TID WC  . insulin aspart  0-5 Units Subcutaneous QHS  . levothyroxine  125 mcg Oral Q0600  . magnesium oxide  400 mg Oral BID  . metoprolol succinate  25 mg Oral QHS  . mirtazapine  30 mg Oral QHS  . multivitamin with minerals  1 tablet Oral Daily  . oxyCODONE-acetaminophen  1 tablet Oral Q6H   And  . oxyCODONE  5 mg Oral Q6H  . povidone-iodine  2 application Topical Once   Continuous Infusions: . sodium chloride 100 mL/hr at 01/08/21 0637  .  ceFAZolin (ANCEF) IV         LOS: 0 days    Time spent: 25 minutes with >50% spent in coordination of care  and at bedside.     Ezekiel Slocumb, DO [facility]  01/08/2021, 4:29 PM      If 7PM-7AM, please contact night-coverage. How to contact the [facility] Attending or Consulting provider [place] or covering provider during after hours [place], for this patient?    1. Check the care team in [facility] and look for a) attending/consulting [facility] provider listed and b) the [facility] team listed 2. Log into [email or web address] and use [place]'s universal password to access. If you do not have the password, please contact the hospital operator. 3. Locate the [facility] provider you are looking for under [facility] and page to a number that you can be directly reached. 4. If you still have difficulty reaching the provider, please page the [facility] (Director on Call) for the Hospitalists listed on amion for assistance.

## 2021-01-08 NOTE — Telephone Encounter (Signed)
Dr. Louanne Skye went by and saw the patient yesterday evening and they have put him on for surgery this evening. Per Dr. Louanne Skye patients wife is aware of this.

## 2021-01-09 ENCOUNTER — Encounter (HOSPITAL_COMMUNITY): Payer: Self-pay | Admitting: Specialist

## 2021-01-09 DIAGNOSIS — M48062 Spinal stenosis, lumbar region with neurogenic claudication: Secondary | ICD-10-CM | POA: Diagnosis not present

## 2021-01-09 LAB — CBC
HCT: 42.5 % (ref 39.0–52.0)
Hemoglobin: 14.3 g/dL (ref 13.0–17.0)
MCH: 31.2 pg (ref 26.0–34.0)
MCHC: 33.6 g/dL (ref 30.0–36.0)
MCV: 92.8 fL (ref 80.0–100.0)
Platelets: 209 10*3/uL (ref 150–400)
RBC: 4.58 MIL/uL (ref 4.22–5.81)
RDW: 11.7 % (ref 11.5–15.5)
WBC: 11.5 10*3/uL — ABNORMAL HIGH (ref 4.0–10.5)
nRBC: 0 % (ref 0.0–0.2)

## 2021-01-09 LAB — BASIC METABOLIC PANEL
Anion gap: 9 (ref 5–15)
BUN: 14 mg/dL (ref 8–23)
CO2: 24 mmol/L (ref 22–32)
Calcium: 9.1 mg/dL (ref 8.9–10.3)
Chloride: 102 mmol/L (ref 98–111)
Creatinine, Ser: 1 mg/dL (ref 0.61–1.24)
GFR, Estimated: >60 mL/min (ref >60)
Glucose, Bld: 162 mg/dL — ABNORMAL HIGH (ref 70–99)
Potassium: 5.1 mmol/L (ref 3.5–5.1)
Sodium: 135 mmol/L (ref 135–145)

## 2021-01-09 LAB — GLUCOSE, CAPILLARY
Glucose-Capillary: 179 mg/dL — ABNORMAL HIGH (ref 70–99)
Glucose-Capillary: 162 mg/dL — ABNORMAL HIGH (ref 70–99)
Glucose-Capillary: 187 mg/dL — ABNORMAL HIGH (ref 70–99)

## 2021-01-09 MED ORDER — ONDANSETRON HCL 4 MG PO TABS: 4.0000 mg | ORAL_TABLET | Freq: Four times a day (QID) | ORAL | Status: DC | PRN

## 2021-01-09 MED ORDER — BISACODYL 5 MG PO TBEC: 5.0000 mg | DELAYED_RELEASE_TABLET | Freq: Every day | ORAL | Status: DC | PRN

## 2021-01-09 MED ORDER — ASPIRIN EC 81 MG PO TBEC
81.0000 mg | DELAYED_RELEASE_TABLET | Freq: Every day | ORAL | Status: DC
  Administered 2021-01-09 – 2021-01-13 (×5): 81 mg via ORAL
  Filled 2021-01-09 (×5): qty 1

## 2021-01-09 MED ORDER — SODIUM CHLORIDE 0.9% FLUSH
3.0000 mL | Freq: Two times a day (BID) | INTRAVENOUS | Status: DC
  Administered 2021-01-09 – 2021-01-12 (×7): 3 mL via INTRAVENOUS

## 2021-01-09 MED ORDER — SODIUM CHLORIDE 0.9% FLUSH: 3.0000 mL | INTRAVENOUS | Status: DC | PRN

## 2021-01-09 MED ORDER — FLEET ENEMA 7-19 GM/118ML RE ENEM: 1.0000 | ENEMA | Freq: Once | RECTAL | Status: DC | PRN

## 2021-01-09 MED ORDER — PANTOPRAZOLE SODIUM 40 MG IV SOLR
40.0000 mg | Freq: Every day | INTRAVENOUS | Status: DC
  Administered 2021-01-09: 40 mg via INTRAVENOUS
  Filled 2021-01-09: qty 40

## 2021-01-09 MED ORDER — POLYETHYLENE GLYCOL 3350 17 G PO PACK
17.0000 g | PACK | Freq: Every day | ORAL | Status: DC | PRN
  Administered 2021-01-09: 17 g via ORAL
  Filled 2021-01-09: qty 1

## 2021-01-09 MED ORDER — SENNOSIDES-DOCUSATE SODIUM 8.6-50 MG PO TABS
1.0000 | ORAL_TABLET | Freq: Two times a day (BID) | ORAL | Status: DC
  Administered 2021-01-09 – 2021-01-13 (×9): 1 via ORAL
  Filled 2021-01-09 (×9): qty 1

## 2021-01-09 MED ORDER — MUPIROCIN 2 % EX OINT
TOPICAL_OINTMENT | Freq: Two times a day (BID) | CUTANEOUS | Status: DC
  Administered 2021-01-12 – 2021-01-13 (×2): 1 via NASAL
  Filled 2021-01-09: qty 22

## 2021-01-09 MED ORDER — POLYETHYLENE GLYCOL 3350 17 G PO PACK
17.0000 g | PACK | Freq: Every day | ORAL | Status: DC
  Administered 2021-01-10 – 2021-01-13 (×3): 17 g via ORAL
  Filled 2021-01-09 (×4): qty 1

## 2021-01-09 MED ORDER — DOCUSATE SODIUM 100 MG PO CAPS
100.0000 mg | ORAL_CAPSULE | Freq: Two times a day (BID) | ORAL | Status: DC
  Administered 2021-01-09 – 2021-01-13 (×9): 100 mg via ORAL
  Filled 2021-01-09 (×9): qty 1

## 2021-01-09 MED ORDER — POLYETHYLENE GLYCOL 3350 17 G PO PACK: 17.0000 g | PACK | Freq: Every day | ORAL | Status: DC

## 2021-01-09 MED ORDER — MENTHOL 3 MG MT LOZG: 1.0000 | LOZENGE | OROMUCOSAL | Status: DC | PRN

## 2021-01-09 MED ORDER — PHENOL 1.4 % MT LIQD: 1.0000 | OROMUCOSAL | Status: DC | PRN

## 2021-01-09 MED ORDER — ACETAMINOPHEN 500 MG PO TABS
1000.0000 mg | ORAL_TABLET | Freq: Four times a day (QID) | ORAL | Status: AC
  Administered 2021-01-09 (×4): 1000 mg via ORAL
  Filled 2021-01-09 (×5): qty 2

## 2021-01-09 MED ORDER — METHOCARBAMOL 500 MG PO TABS: 500.0000 mg | ORAL_TABLET | Freq: Four times a day (QID) | ORAL | Status: DC | PRN

## 2021-01-09 MED ORDER — ALUM & MAG HYDROXIDE-SIMETH 200-200-20 MG/5ML PO SUSP: 30.0000 mL | Freq: Four times a day (QID) | ORAL | Status: DC | PRN

## 2021-01-09 MED ORDER — ONDANSETRON HCL 4 MG/2ML IJ SOLN: 4.0000 mg | Freq: Four times a day (QID) | INTRAMUSCULAR | Status: DC | PRN

## 2021-01-09 MED ORDER — ACETAMINOPHEN 325 MG PO TABS: 650.0000 mg | ORAL_TABLET | ORAL | Status: DC | PRN

## 2021-01-09 MED ORDER — METHOCARBAMOL 1000 MG/10ML IJ SOLN
500.0000 mg | Freq: Four times a day (QID) | INTRAMUSCULAR | Status: DC | PRN
  Filled 2021-01-09: qty 5

## 2021-01-09 MED ORDER — ACETAMINOPHEN 650 MG RE SUPP: 650.0000 mg | RECTAL | Status: DC | PRN

## 2021-01-09 MED ORDER — PANTOPRAZOLE SODIUM 40 MG PO TBEC
40.0000 mg | DELAYED_RELEASE_TABLET | Freq: Every day | ORAL | Status: DC
  Administered 2021-01-09 – 2021-01-12 (×4): 40 mg via ORAL
  Filled 2021-01-09 (×4): qty 1

## 2021-01-09 MED ORDER — SODIUM CHLORIDE 0.9 % IV SOLN: INTRAVENOUS | Status: DC

## 2021-01-09 MED ORDER — SODIUM CHLORIDE 0.9 % IV SOLN: 250.0000 mL | INTRAVENOUS | Status: DC

## 2021-01-09 MED ORDER — CEFAZOLIN SODIUM-DEXTROSE 2-4 GM/100ML-% IV SOLN
2.0000 g | Freq: Three times a day (TID) | INTRAVENOUS | Status: AC
  Administered 2021-01-09 (×2): 2 g via INTRAVENOUS
  Filled 2021-01-09 (×2): qty 100

## 2021-01-09 NOTE — Anesthesia Postprocedure Evaluation (Signed)
Anesthesia Post Note  Patient: Antonio Oliver  Procedure(s) Performed: CENTRAL LUMBAR LAMINECTOMY LUMBAR TWO-THREE, LUMBAR THREE-FOUR (N/A Back)     Patient location during evaluation: PACU Anesthesia Type: General Level of consciousness: awake and alert Pain management: pain level controlled Vital Signs Assessment: post-procedure vital signs reviewed and stable Respiratory status: spontaneous breathing, nonlabored ventilation, respiratory function stable and patient connected to nasal cannula oxygen Cardiovascular status: blood pressure returned to baseline and stable Postop Assessment: no apparent nausea or vomiting Anesthetic complications: no   No complications documented.  Last Vitals:  Vitals:   01/09/21 0006 01/09/21 0433  BP: 140/87 128/78  Pulse: 88 90  Resp: 16 15  Temp: 36.9 C 36.4 C  SpO2: 94% 100%    Last Pain:  Vitals:   01/09/21 0433  TempSrc: Oral  PainSc:                  Lanika Colgate,W. EDMOND

## 2021-01-09 NOTE — Progress Notes (Signed)
Occupational Therapy Evaluation Patient Details Name: Antonio Oliver MRN: [id number] DOB: [birth year] Today's Date: 01/09/2021    History of Present Illness 65 y.o. male admitted under observation status on 01/05/21 for low back pain with R LE weakness, MRI completed and awaiting ortho consult (had Dr. Deno Etienne before for lumbar spine surgery).  Pt with other significant PMH of AVR, bariatric surgery, L foot fx (cuneiform), HTN, glaucoma, fx of the L tibia s/p IM nail, complete heart block, charcot's deformity of the foot L, anxiety, neck surgery x 2, back surgery x 4.   Clinical Impression   Grier was evaluated s/p the above back surgery, he reported 9/10 pain upon arrival, RN gave meds during the session and he was willing to participate. Walter reported being mod I with use of a cane PTA however reported for the last two weeks he has been unable to walk. He lives on the first floor of his home with his wife who works from 11:00a - 8:30p during the week. Cherry completed ADLs while sitting EOB at a set up level for upper body tasks, and mod/max A for lower body tasks. Ugo was able to complete 2 sit<>stands this session with mod A, and shift his wight in standing, due to weakness and pain he was unable to progress beyond shifting weight in standing. Pt benefits from acute OT to progress function in ADLs and mobility. Recommend d/c to home with [facility] and supervision for all ADLs and mobility.     Follow Up Recommendations  Home health OT;Supervision/Assistance - 24 hour    Equipment Recommendations  3 in 1 bedside commode;Other (comment) (AE)       Precautions / Restrictions Precautions Precautions: Fall Precaution Comments: pt reports history of falls and stumbles at home Restrictions Weight Bearing Restrictions: No Other Position/Activity Restrictions: No bending, lifting, twisting, arching      Mobility Bed Mobility Overal bed mobility: Needs Assistance Bed Mobility: Rolling;Sidelying to  Sit;Sit to Sidelying Rolling: Min guard Sidelying to sit: Min guard     Sit to sidelying: Min assist General bed mobility comments: vc for log rolling, min A for BLE management back into bed    Transfers Overall transfer level: Needs assistance   Transfers: Sit to/from Stand Sit to Stand: Mod assist;From elevated surface         General transfer comment: pt wtih mod A to boost from sitting into standing, min A for balance in standing. Able to weight shift from RLE to LLE in standing, however unable to march in place at this time without 2+ A for balance and safety    Balance Overall balance assessment: Needs assistance Sitting-balance support: Feet supported;No upper extremity supported Sitting balance-Leahy Scale: Good     Standing balance support: Bilateral upper extremity supported Standing balance-Leahy Scale: Poor         ADL either performed or assessed with clinical judgement   ADL Overall ADL's : Needs assistance/impaired Eating/Feeding: Independent;Sitting   Grooming: Wash/dry hands;Wash/dry face;Oral care;Applying deodorant;Brushing hair;Set up;Sitting   Upper Body Bathing: Set up;Sitting   Lower Body Bathing: Moderate assistance;Sit to/from stand   Upper Body Dressing : Set up;Sitting   Lower Body Dressing: Maximal assistance;Sit to/from stand   Toilet Transfer: Maximal assistance;+2 for physical assistance;+2 for safety/equipment;BSC;Stand-pivot   Toileting- Clothing Manipulation and Hygiene: Maximal assistance;Sit to/from stand       Functional mobility during ADLs: Maximal assistance;+2 for physical assistance;+2 for safety/equipment General ADL Comments: Pt completed ADLs at bed level this session with  one sit<>stand                  Pertinent Vitals/Pain Pain Assessment: 0-10 Pain Score: 9  Pain Location: back Pain Descriptors / Indicators: Discomfort Pain Intervention(s): Limited activity within patient's tolerance;RN gave pain meds  during session     Hand Dominance Right   Extremity/Trunk Assessment Upper Extremity Assessment Upper Extremity Assessment: Generalized weakness   Lower Extremity Assessment Lower Extremity Assessment: Defer to PT evaluation   Cervical / Trunk Assessment Cervical / Trunk Assessment: Other exceptions (back sx) Cervical / Trunk Exceptions: torticollis   Communication Communication Communication: No difficulties   Cognition Arousal/Alertness: Awake/alert Behavior During Therapy: WFL for tasks assessed/performed Overall Cognitive Status: Within Functional Limits for tasks assessed           General Comments  Pt sx site bandage clean and dry, multiple skin tears and brusises present throughout all extermeties; spent time educating back precautions      Home Living Family/patient expects to be discharged to:: Private residence Living Arrangements: Spouse/significant other Available Help at Discharge: Family;Available PRN/intermittently Type of Home: House Home Access: Stairs to enter;Ramped entrance Entrance Stairs-Number of Steps: 6 Entrance Stairs-Rails: Right;Left Home Layout: Two level;Able to live on main level with bedroom/bathroom Alternate Level Stairs-Number of Steps: flight   Bathroom Shower/Tub: Occupational psychologist: Standard (wtih toilet riser) Bathroom Accessibility: Yes How Accessible: Accessible via walker Home Equipment: [place] - 2 wheels;Cane - single point;Wheelchair - manual   Additional Comments: wife works 11a-830p during the week      Prior Functioning/Environment Level of Independence: Independent with assistive device(s)        Comments: pt reports walking with a cane full time (in house and in public).  He is a retired Pharmacist, hospital.  Last few days wife has had to pick him up, two people to get into the car to come to the hospital.        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 OT Goal Formulation: With patient Time For Goal Achievement: 01/23/21 Potential to Achieve Goals: Fair ADL Goals Pt Will Perform Lower Body Bathing: with min guard assist;sit to/from stand Pt Will Perform Lower Body Dressing: with min guard assist;with adaptive equipment;sit to/from stand Pt Will Transfer to Toilet: with min guard assist;stand pivot transfer;bedside commode Pt Will Perform Toileting - Clothing Manipulation and hygiene: with min guard assist;sit to/from stand Additional ADL Goal #1: Pt will recall at least 3 fall prevention stratgies to prepare for a safe transition into the home environment  OT Frequency: Min 2X/week    AM-PAC OT "6 Clicks" Daily Activity     Outcome Measure Help from another person eating meals?: None Help from another person taking care of personal grooming?: A Little Help from another person toileting, which includes using toliet, bedpan, or urinal?: A Lot Help from another person bathing (including washing, rinsing, drying)?: A Lot Help from another person to put on and taking off regular upper body clothing?: A Little Help from another person to put on and taking off regular lower body clothing?: A Lot 6 Click Score: 16  End of Session Equipment Utilized During Treatment: Gait belt Nurse Communication: Mobility status;Patient requests pain meds  Activity Tolerance: Patient tolerated treatment well;Patient limited by pain Patient left: in bed;with call bell/phone within reach;with bed alarm set  OT Visit Diagnosis:  Unsteadiness on feet (R26.81);Other abnormalities of gait and mobility (R26.89);Repeated falls (R29.6);Muscle weakness (generalized) (M62.81);History of falling (Z91.81);Pain Pain - Right/Left:  (back) Pain - part of body:  (back)                Time: 0900-0930 OT Time Calculation (min): 30 min Charges:  OT General Charges $OT Visit: 1 Visit OT Evaluation $OT Eval Moderate Complexity: 1 Mod OT Treatments $Self Care/Home Management : 8-22 mins    Cadell Gabrielson A Dayelin Balducci 01/09/2021, 10:06 AM

## 2021-01-09 NOTE — Progress Notes (Addendum)
PROGRESS NOTE    CEAZAR BOGIE   [id number]  DOB: 1956/04/20  PCP: Cher Nakai, MD    DOA: 01/05/2021 LOS: 1   Brief Narrative   As per H&P written by Dr. Marylyn Ishihara 01/05/2021 "Antonio Oliver is a 65 y.o. male with medical history significant of DM2, HTN, HLD, neck surgery. Presenting with back pain and right lower extremity weakness. He reports that he woke up 5 days ago and could not walk. He went to an [facility] where he had a w/u that ruled out stroke. However, he was unable to get an MRI d/t a pacer. It was recommended that he follow up with a back specialist. He did regain some strength and ability to mobilize. So he followed up with his previous back specialist (Dr. Louanne Skye) who ordered a MRI of his back to be completed on 5/10. This morning he woke up with worsening back pain and the inability to walk again. The pain was in his lower back and radiating down his right hip into his right leg. It was a burning pain that turned sharp with movement. He tried his percocet, but it didn't help. He spoke with his back team. It was recommended that he come to the ED. He denies any other aggravating or alleviating factors. "  Patient transferred to [facility] due to need for MRI with pacemaker present, and to be seen and evaluated by his back surgeon as well.  MRI Lumbar Spine IMPRESSION: 1. Since previous MRI of 2008, the patient has undergone laminectomy and PLIF from L4 through S1. The spinal canal and neural foramina are well decompressed at the operative levels, and the interbody fusion appears solid. 2. Interval development of adjacent segment disease at L3-4 with advanced bilateral facet hypertrophy contributing to moderate to severe multifactorial spinal stenosis. 3. Progressive disc and endplate degeneration asymmetric to the right at L2-3 with mild spinal stenosis and mild right foraminal narrowing. 4. Moderate bladder distension without demonstrated cause.     Assessment & Plan    Active Problems:   Back pain   Lumbar stenosis with neurogenic claudication   Low back pain with right lower extremity radiculopathy Hx of prior back surgery, now with acute worsening several days ago.  5/4 has ongoing severe pain.   MRI lumbar spine - see report - showed severe lumbar stenosis. 5/5 Central lumbar laminectomy at levels L2-3, L3-4 --Ortho following, Dr. Louanne Skye  --Pain control per orders --Continue gabapentin and Cymbalta, Zalaflex --PT and OT as able  Constipation - Most likely due to opioids. Bowel regimen per orders  Essential hypertension - chronic, intermittently high likely due to pain.  Continue metoprolol.  Hyperlipidemia - continue atorvastatin  Type 2 diabetes - sliding scale Novolog  History of BPH -  Continue Proscar.  No reports of acute retention.   Anxiety/depression/Insomnia- stable.  Continue Wellbutrin, Rexulti, Klonopin PRN, Cymbalta, Remeron  Hx of migraines - continue Imitrex PRN  Hypothyroidism - continue levothyroxine  Patient BMI: Body mass index is 24.87 kg/m.   DVT prophylaxis: SCD's Start: 01/09/21 0006 SCDs Start: 01/05/21 1602   Diet:  Diet Orders (From admission, onward)    Start     Ordered   01/09/21 0505  Diet Carb Modified Fluid consistency: Thin; Room service appropriate? Yes  Diet effective now       Question Answer Comment  Diet-HS Snack? Nothing   Calorie Level Medium 1600-2000   Fluid consistency: Thin   Room service appropriate? Yes  01/09/21 0504            Code Status: Partial Code    Subjective 01/09/21    Pt awake sitting up in bed, wife at bedside.  He reports still has a great deal of pain, can't really tell if it's different quality of pain compared to before surgery.  Stood up with OT this AM, legs felt weak but maybe a bit stronger.  No BM yet. Tolerating diet.   Disposition Plan & Communication   Status is: Inpatient  Inpatient appropriate due to severity of illness, post-op  day 1 lumbar surgery, uncontrolled pain, post-op therapy assessments pending  Dispo: The patient is from: home              Anticipated d/c is to: CIR              Patient currently not medically stable for discharge   Difficult to place patient not expected   Family Communication: wife at bedside on rounds 5/4   Consults, Procedures, Significant Events   Consultants:   Orthopedics, Dr. Louanne Skye  Procedures:   none  Antimicrobials:  Anti-infectives (From admission, onward)   Start     Dose/Rate Route Frequency Ordered Stop   01/09/21 0400  ceFAZolin (ANCEF) IVPB 2g/100 mL premix        2 g 200 mL/hr over 30 Minutes Intravenous Every 8 hours 01/09/21 0005 01/09/21 1204   01/08/21 1600  ceFAZolin (ANCEF) IVPB 2g/100 mL premix        2 g 200 mL/hr over 30 Minutes Intravenous To Short Stay 01/07/21 2348 01/08/21 1940        Micro    Objective   Vitals:   01/08/21 2340 01/09/21 0006 01/09/21 0433 01/09/21 0743  BP: 132/80 140/87 128/78 131/75  Pulse: 85 88 90 87  Resp: 17 16 15 18   Temp: 97.9 F (36.6 C) 98.5 F (36.9 C) 97.6 F (36.4 C) 98 F (36.7 C)  TempSrc:  Oral Oral   SpO2: 98% 94% 100% 98%  Weight:      Height:        Intake/Output Summary (Last 24 hours) at 01/09/2021 1247 Last data filed at 01/09/2021 0500 Gross per 24 hour  Intake 1680 ml  Output 1125 ml  Net 555 ml   Filed Weights   01/05/21 1200 01/08/21 1753  Weight: 90.3 kg 90.3 kg    Physical Exam:  General exam: awake, alert, no acute distress Respiratory system: CTAB without wheezes or rhonchi, normal respiratory effort, on room air. Cardiovascular system: regular rate rhythm, no peripheral edema.   Central nervous system: A&O x3. Grossly nonfocal exam. Normal speech Extremities: moves all, no cyanosis, normal tone   Labs   Data Reviewed: I have personally reviewed following labs and imaging studies  CBC: Recent Labs  Lab 01/05/21 1233 01/06/21 0414 01/08/21 0444 01/09/21 0052   WBC 8.4 7.5 10.1 11.5*  NEUTROABS 4.9  --  6.9  --   HGB 13.9 13.2 14.8 14.3  HCT 40.6 39.0 43.8 42.5  MCV 94.4 92.4 92.2 92.8  PLT 241 188 221 130   Basic Metabolic Panel: Recent Labs  Lab 01/05/21 1233 01/06/21 0414 01/08/21 0444 01/09/21 0052  NA 141 139 138 135  K 4.5 4.3 4.6 5.1  CL 108 106 104 102  CO2 25 26 24 24   GLUCOSE 128* 129* 136* 162*  BUN 12 10 14 14   CREATININE 0.89 1.00 0.92 1.00  CALCIUM 9.4 9.1 9.3 9.1  GFR: Estimated Creatinine Clearance: 90.4 mL/min (by C-G formula based on SCr of 1 mg/dL). Liver Function Tests: Recent Labs  Lab 01/06/21 0414  AST 23  ALT 49*  ALKPHOS 60  BILITOT 0.8  PROT 5.4*  ALBUMIN 3.1*   No results for input(s): LIPASE, AMYLASE in the last 168 hours. No results for input(s): AMMONIA in the last 168 hours. Coagulation Profile: Recent Labs  Lab 01/08/21 0444  INR 1.0   Cardiac Enzymes: No results for input(s): CKTOTAL, CKMB, CKMBINDEX, TROPONINI in the last 168 hours. BNP (last 3 results) No results for input(s): PROBNP in the last 8760 hours. HbA1C: No results for input(s): HGBA1C in the last 72 hours. CBG: Recent Labs  Lab 01/08/21 1312 01/08/21 1755 01/08/21 2226 01/09/21 0637 01/09/21 1201  GLUCAP 128* 113* 133* 179* 162*   Lipid Profile: No results for input(s): CHOL, HDL, LDLCALC, TRIG, CHOLHDL, LDLDIRECT in the last 72 hours. Thyroid Function Tests: No results for input(s): TSH, T4TOTAL, FREET4, T3FREE, THYROIDAB in the last 72 hours. Anemia Panel: No results for input(s): VITAMINB12, FOLATE, FERRITIN, TIBC, IRON, RETICCTPCT in the last 72 hours. Sepsis Labs: No results for input(s): PROCALCITON, LATICACIDVEN in the last 168 hours.  Recent Results (from the past 240 hour(s))  Resp Panel by RT-PCR (Flu A&B, Covid) Nasopharyngeal Swab     Status: None   Collection Time: 01/05/21  1:35 PM   Specimen: Nasopharyngeal Swab; Nasopharyngeal(NP) swabs in vial transport medium  Result Value Ref Range  Status   SARS Coronavirus 2 by RT PCR NEGATIVE NEGATIVE Final    Comment: (NOTE) SARS-CoV-2 target nucleic acids are NOT DETECTED.  The SARS-CoV-2 RNA is generally detectable in upper respiratory specimens during the acute phase of infection. The lowest concentration of SARS-CoV-2 viral copies this assay can detect is 138 copies/mL. A negative result does not preclude SARS-Cov-2 infection and should not be used as the sole basis for treatment or other patient management decisions. A negative result may occur with  improper specimen collection/handling, submission of specimen other than nasopharyngeal swab, presence of viral mutation(s) within the areas targeted by this assay, and inadequate number of viral copies(<138 copies/mL). A negative result must be combined with clinical observations, patient history, and epidemiological information. The expected result is Negative.  Fact Sheet for Patients:  EntrepreneurPulse.com.au  Fact Sheet for Healthcare Providers:  IncredibleEmployment.be  This test is no t yet approved or cleared by the [place] FDA and  has been authorized for detection and/or diagnosis of SARS-CoV-2 by FDA under an Emergency Use Authorization (EUA). This EUA will remain  in effect (meaning this test can be used) for the duration of the COVID-19 declaration under Section 564(b)(1) of the Act, 21 U.S.C.section 360bbb-3(b)(1), unless the authorization is terminated  or revoked sooner.       Influenza A by PCR NEGATIVE NEGATIVE Final   Influenza B by PCR NEGATIVE NEGATIVE Final    Comment: (NOTE) The Xpert Xpress SARS-CoV-2/FLU/RSV plus assay is intended as an aid in the diagnosis of influenza from Nasopharyngeal swab specimens and should not be used as a sole basis for treatment. Nasal washings and aspirates are unacceptable for Xpert Xpress SARS-CoV-2/FLU/RSV testing.  Fact Sheet for  Patients: EntrepreneurPulse.com.au  Fact Sheet for Healthcare Providers: IncredibleEmployment.be  This test is not yet approved or cleared by the [place] FDA and has been authorized for detection and/or diagnosis of SARS-CoV-2 by FDA under an Emergency Use Authorization (EUA). This EUA will remain in effect (meaning this test can  be used) for the duration of the COVID-19 declaration under Section 564(b)(1) of the Act, 21 U.S.C. section 360bbb-3(b)(1), unless the authorization is terminated or revoked.  Performed at [facility], [place] [address] [place], [place] [address]   MRSA PCR Screening     Status: Abnormal   Collection Time: 01/08/21  5:43 AM   Specimen: Nasal Mucosa; Nasopharyngeal  Result Value Ref Range Status   MRSA by PCR POSITIVE (A) NEGATIVE Final    Comment:        The GeneXpert MRSA Assay (FDA approved for NASAL specimens only), is one component of a comprehensive MRSA colonization surveillance program. It is not intended to diagnose MRSA infection nor to guide or monitor treatment for MRSA infections. RESULT CALLED TO, READ BACK BY AND VERIFIED WITH: Julianne Handler RN 01/08/21 0654 JDW Performed at [facility], [place] [address] [place], [place] [address]       Imaging Studies   DG Lumbar Spine 2-3 Views  Addendum Date: 01/08/2021   ADDENDUM REPORT: 01/08/2021 20:59 ADDENDUM: These results were called by telephone at the time of interpretation on 01/08/2021 at 8:55 pm to provider [name] , who verbally acknowledged these results. Electronically Signed   By: Fidela Salisbury MD   On: 01/08/2021 20:59   Result Date: 01/08/2021 CLINICAL DATA:  L3-4 laminectomy.  Intraoperative examination. EXAM: LUMBAR SPINE - 2-3 VIEW COMPARISON:  12:49 a.m. FINDINGS: Portable cross-table lateral radiograph of the lumbar spine is presented. L4-S1 lumbar fusion with instrumentation has been performed. Metallic probe is  seen posterior to the L2-3 facet. Normal alignment. IMPRESSION: Metallic probe posterior to the L2-3 facet. Electronically Signed: By: Fidela Salisbury MD On: 01/08/2021 20:40   DG Lumbar Spine 2-3 Views  Result Date: 01/08/2021 CLINICAL DATA:  Neurogenic claudication EXAM: LUMBAR SPINE - 2-3 VIEW COMPARISON:  12/31/2020 FINDINGS: L4-S1 PLIF. No hardware abnormality. Mild height loss at L1 and L2 is unchanged. Normal alignment. IMPRESSION: Unchanged appearance of L4-S1 PLIF. Electronically Signed   By: Ulyses Jarred M.D.   On: 01/08/2021 02:05     Medications   Scheduled Meds: . acetaminophen  1,000 mg Oral Q6H  . aspirin EC  81 mg Oral Daily  . atorvastatin  20 mg Oral q1800  . brexpiprazole  2 mg Oral Daily  . buPROPion  200 mg Oral BID  . busPIRone  20 mg Oral BID  . darifenacin  7.5 mg Oral Daily  . docusate sodium  100 mg Oral BID  . DULoxetine  60 mg Oral BID  . finasteride  5 mg Oral Daily  . folic acid  1 mg Oral Daily  . gabapentin  600-1,200 mg Oral TID  . insulin aspart  0-15 Units Subcutaneous TID WC  . insulin aspart  0-5 Units Subcutaneous QHS  . levothyroxine  125 mcg Oral Q0600  . magnesium oxide  400 mg Oral BID  . metoprolol succinate  25 mg Oral QHS  . mirtazapine  30 mg Oral QHS  . multivitamin with minerals  1 tablet Oral Daily  . mupirocin ointment   Nasal BID  . oxyCODONE-acetaminophen  1 tablet Oral Q6H   And  . oxyCODONE  5 mg Oral Q6H  . pantoprazole  40 mg Oral QHS  . [START ON 01/10/2021] polyethylene glycol  17 g Oral Daily  . senna-docusate  1 tablet Oral BID  . sodium chloride flush  3 mL Intravenous Q12H   Continuous Infusions: . sodium chloride 100 mL/hr  at 01/09/21 0052  . sodium chloride Stopped (01/09/21 0054)  . methocarbamol (ROBAXIN) IV         LOS: 1 day    Time spent: 25 minutes with >50% spent in coordination of care and at bedside.     Ezekiel Slocumb, DO [facility]  01/09/2021, 12:47 PM      If 7PM-7AM, please  contact night-coverage. How to contact the [facility] Attending or Consulting provider [place] or covering provider during after hours [facility], for this patient?    1. Check the care team in [facility] and look for a) attending/consulting TR[facility] provider listed and b) the [facility] team listed 2. Log into [email or web address] and use [place]'s universal password to access. If you do not have the password, please contact the hospital operator. 3. Locate the [facility] provider you are looking for under [facility] and page to a number that you can be directly reached. 4. If you still have difficulty reaching the provider, please page the [facility] (Director on Call) for the Hospitalists listed on amion for assistance.

## 2021-01-09 NOTE — Evaluation (Signed)
Physical Therapy Re-Evaluation Patient Details Name: Antonio Oliver MRN: [id number] DOB: [birth year] Today's Date: 01/09/2021   History of Present Illness  65 y.o. male admitted under observation status on 01/05/21 for low back pain with R LE weakness, MRI completed and awaiting ortho consult.  Pt is now s/p central lumbar laminectomy L2-4 on 01/08/21. Pt with other significant PMH of AVR, bariatric surgery, L foot fx (cuneiform), HTN, glaucoma, fx of the L tibia s/p IM nail, complete heart block, charcot's deformity of the foot L, anxiety, neck surgery x 2, back surgery x 4.  Clinical Impression  Pt is now s/p back surgery and is already showing improvements in leg strength.  He is limited by incisional pain and weakness when up.  He was able to transfer OOB to chair with RW and one person min/mod assist today. I would feel safer progressing gait with chair to follow.  Will plan to bring someone to follow with chair tomorrow.  I still think he would be appropriate for CIR level therapies at discharge.   PT to follow acutely for deficits listed below.      Follow Up Recommendations CIR    Equipment Recommendations  3in1 (PT)    Recommendations for Other Services Rehab consult     Precautions / Restrictions Precautions Precautions: Fall;Back Precaution Booklet Issued: Yes (comment) Precaution Comments: pt reports history of falls and stumbles at home, reviewed log roll and no BLT      Mobility  Bed Mobility Overal bed mobility: Needs Assistance Bed Mobility: Rolling;Sidelying to Sit Rolling: Min assist Sidelying to sit: Min assist       General bed mobility comments: verbal cues to flex knees for log roll    Transfers Overall transfer level: Needs assistance Equipment used: Rolling walker (2 wheeled) Transfers: Sit to/from [facility] Sit to Stand: Mod assist;From elevated surface Stand pivot transfers: Min assist;From elevated surface       General transfer  comment: Mod assist to stand from elevated bed to RW.  Min assist to take pivotal steps over to the recliner chair, heavy reliance on UE support during stepping as his knees and hips became progressively more flexed with each step.  Mod assist to help control descent down to low chair.  Ambulation/Gait             General Gait Details: therapist did not feel safe progressing gait without a second person to follow with chair.  Will come prepared to do that tomorrow.  Stairs            Wheelchair Mobility    Modified Rankin (Stroke Patients Only)       Balance Overall balance assessment: Needs assistance Sitting-balance support: Feet supported;No upper extremity supported Sitting balance-Leahy Scale: Good     Standing balance support: Bilateral upper extremity supported Standing balance-Leahy Scale: Poor                               Pertinent Vitals/Pain Pain Assessment: 0-10 Pain Score: 8  Pain Location: back Pain Descriptors / Indicators: Discomfort Pain Intervention(s): Limited activity within patient's tolerance;Monitored during session;Premedicated before session;Repositioned    Home Living Family/patient expects to be discharged to:: Private residence Living Arrangements: Spouse/significant other Available Help at Discharge: Family;Available PRN/intermittently Type of Home: House Home Access: Stairs to enter;Ramped entrance Entrance Stairs-Rails: Right;Left Entrance Stairs-Number of Steps: 6 Home Layout: Two level;Able to live on main level with bedroom/bathroom Home  Equipment: Gilford Rile - 2 wheels;Cane - single point;Wheelchair - manual Additional Comments: wife works 11a-830p during the week    Prior Function Level of Independence: Independent with assistive device(s)         Comments: pt reports walking with a cane full time (in house and in public).  He is a retired Pharmacist, hospital.  Last few days wife has had to pick him up, two people to get  into the car to come to the hospital.     Hand Dominance   Dominant Hand: Right    Extremity/Trunk Assessment   Upper Extremity Assessment Upper Extremity Assessment: Defer to OT evaluation    Lower Extremity Assessment Lower Extremity Assessment: RLE deficits/detail;LLE deficits/detail RLE Deficits / Details: leg strength has improved since surgery.  He is now at least a 3-/5 per seated MMT.  Functionally he remains weak in standing with difficulty picking up and progressing legs forward during pivotal steps.  No buckling, but progressive flexion at knees and hips as he fatigued during transfer. LLE Deficits / Details: leg strength has improved since surgery.  He is now at least a 3-/5 per seated MMT.  Functionally he remains weak in standing with difficulty picking up and progressing legs forward during pivotal steps.  No buckling, but progressive flexion at knees and hips as he fatigued during transfer.    Cervical / Trunk Assessment Cervical / Trunk Assessment: Other exceptions Cervical / Trunk Exceptions: torticollis  Communication   Communication: No difficulties  Cognition Arousal/Alertness: Awake/alert Behavior During Therapy: WFL for tasks assessed/performed Overall Cognitive Status: Within Functional Limits for tasks assessed                                        General Comments      Exercises     Assessment/Plan    PT Assessment Patient needs continued PT services  PT Problem List Decreased strength;Decreased activity tolerance;Decreased balance;Decreased mobility;Decreased knowledge of use of DME;Decreased knowledge of precautions;Pain       PT Treatment Interventions DME instruction;Gait training;Functional mobility training;Stair training;Therapeutic activities;Therapeutic exercise;Balance training;Patient/family education;Modalities    PT Goals (Current goals can be found in the Care Plan section)  Acute Rehab PT Goals Patient Stated  Goal: 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 PT Goal Formulation: With patient Time For Goal Achievement: 01/23/21 Potential to Achieve Goals: Good    Frequency Min 5X/week   Barriers to discharge        Co-evaluation               AM-PAC PT "6 Clicks" Mobility  Outcome Measure Help needed turning from your back to your side while in a flat bed without using bedrails?: A Little Help needed moving from lying on your back to sitting on the side of a flat bed without using bedrails?: A Little Help needed moving to and from a bed to a chair (including a wheelchair)?: A Little Help needed standing up from a chair using your arms (e.g., wheelchair or bedside chair)?: A Lot Help needed to walk in hospital room?: A Lot Help needed climbing 3-5 steps with a railing? : Total 6 Click Score: 14    End of Session Equipment Utilized During Treatment: Gait belt Activity Tolerance: Patient limited by pain;Patient limited by fatigue Patient left: in chair;with call bell/phone within reach Nurse Communication: Mobility status  PT Visit Diagnosis: Muscle weakness (generalized) (M62.81);Difficulty in walking, not elsewhere classified (R26.2);Other symptoms and signs involving the nervous system (R29.898);Pain Pain - Right/Left:  (incisional low back) Pain - part of body:  (incisional low back)    Time: 1550-1610 PT Time Calculation (min) (ACUTE ONLY): 20 min   Charges:   PT Evaluation $PT Re-evaluation: 1 Re-eval        Verdene Lennert, PT, DPT  Acute Rehabilitation [phone] pager [phone]) [phone] office

## 2021-01-09 NOTE — Progress Notes (Signed)
     Subjective: 1 Day Post-Op Procedure(s) (LRB): CENTRAL LUMBAR LAMINECTOMY LUMBAR TWO-THREE, LUMBAR THREE-FOUR (N/A)Awake, alert and oriented x 4. Back discomfort periop incisional discomfort. Voiding with foley out. Walked short distance  Patient reports pain as moderate.    Objective:   VITALS:  Temp:  [97.6 F (36.4 C)-98.5 F (36.9 C)] 98 F (36.7 C) (05/06 0743) Pulse Rate:  [70-90] 87 (05/06 0743) Resp:  [14-26] 18 (05/06 0743) BP: (128-148)/(75-104) 131/75 (05/06 0743) SpO2:  [94 %-100 %] 98 % (05/06 0743) Weight:  [90.3 kg] 90.3 kg (05/05 1753)  Neurologically intact ABD soft Neurovascular intact Sensation intact distally Intact pulses distally Dorsiflexion/Plantar flexion intact Incision: no drainage   LABS Recent Labs    01/08/21 0444 01/09/21 0052  HGB 14.8 14.3  WBC 10.1 11.5*  PLT 221 209   Recent Labs    01/08/21 0444 01/09/21 0052  NA 138 135  K 4.6 5.1  CL 104 102  CO2 24 24  BUN 14 14  CREATININE 0.92 1.00  GLUCOSE 136* 162*   Recent Labs    01/08/21 0444  INR 1.0     Assessment/Plan: 1 Day Post-Op Procedure(s) (LRB): CENTRAL LUMBAR LAMINECTOMY LUMBAR TWO-THREE, LUMBAR THREE-FOUR (N/A)  Advance diet Up with therapy D/C IV fluids  Slow with PT but pain in legs is gone No Incentive spirometrey   [name] 01/09/2021, 12:21 PMPatient ID: Antonio Oliver, male   DOB: 1956/03/12, 65 y.o.   MRN: [id number]

## 2021-01-09 NOTE — Plan of Care (Signed)

## 2021-01-10 DIAGNOSIS — M48062 Spinal stenosis, lumbar region with neurogenic claudication: Secondary | ICD-10-CM | POA: Diagnosis not present

## 2021-01-10 LAB — GLUCOSE, CAPILLARY
Glucose-Capillary: 111 mg/dL — ABNORMAL HIGH (ref 70–99)
Glucose-Capillary: 154 mg/dL — ABNORMAL HIGH (ref 70–99)
Glucose-Capillary: 115 mg/dL — ABNORMAL HIGH (ref 70–99)
Glucose-Capillary: 144 mg/dL — ABNORMAL HIGH (ref 70–99)
Glucose-Capillary: 137 mg/dL — ABNORMAL HIGH (ref 70–99)

## 2021-01-10 MED ORDER — PHENYLEPHRINE HCL-NACL 10-0.9 MG/250ML-% IV SOLN
INTRAVENOUS | Status: AC
  Filled 2021-01-10: qty 250

## 2021-01-10 NOTE — Progress Notes (Signed)
Physical Therapy Treatment Patient Details Name: Antonio Oliver MRN: [id number] DOB: Nov 10, 1955 Today's Date: 01/10/2021    History of Present Illness 65 y.o. male admitted under observation status on 01/05/21 for low back pain with R LE weakness, MRI completed and awaiting ortho consult.  Pt is now s/p central lumbar laminectomy L2-4 on 01/08/21. Pt with other significant PMH of AVR, bariatric surgery, L foot fx (cuneiform), HTN, glaucoma, fx of the L tibia s/p IM nail, complete heart block, charcot's deformity of the foot L, anxiety, neck surgery x 2, back surgery x 4.    PT Comments    Pt received in chair, c/o fatigue and severe back pain but participatory in session as able. Session focus on instruction of back precautions/safety with transfers and exercises for strengthening. He performed supine/seated BLE A/AAROM therapeutic exercises with good tolerance and increased time per General Strengthening Exercise handout (copy printed and given to pt) and reports pain localized to surgical incision and radiating to sacrum during mobility tasks. Pt continues to benefit from PT services to progress toward functional mobility goals. Pt continues to need up to +72modA for transfers and bed mobility and will continue to recommend CIR as he remains below functional baseline.  Follow Up Recommendations  CIR     Equipment Recommendations  3in1 (PT)    Recommendations for Other Services Rehab consult     Precautions / Restrictions Precautions Precautions: Fall;Back Precaution Booklet Issued: Yes (comment) Precaution Comments: able to recall 2/3 back precautions but needs hint for 3rd, needs further instruction on how far he can bend (not past 90*), etc. Continue to reinforce Restrictions Weight Bearing Restrictions: No    Mobility  Bed Mobility Overal bed mobility: Needs Assistance Bed Mobility: Rolling;Sit to Sidelying Rolling: Min assist       Sit to sidelying: Min assist General bed  mobility comments: began in recliner needed verbal/tactile cues for log roll and visual demo at beginning of session with fair carryover    Transfers Overall transfer level: Needs assistance Equipment used: Rolling walker (2 wheeled) Transfers: Sit to/from [facility] Sit to Stand: Mod assist;+2 physical assistance;+2 safety/equipment Stand pivot transfers: Mod assist;+2 safety/equipment       General transfer comment: Increased posterior lean upon standing needs cues/time for upright posture to achieve balance at RW; pt too fatigued to progress gait so stand pivot to bed from recliner, needs +2 for safety due to pain/fatigue; needs manual assist to manage RW and cues for weight shifting  Ambulation/Gait Ambulation/Gait assistance: Mod assist;+2 safety/equipment Gait Distance (Feet): 3 Feet Assistive device: Rolling walker (2 wheeled) Gait Pattern/deviations: Step-to pattern;Shuffle;Decreased dorsiflexion - right;Decreased dorsiflexion - left;Decreased stride length;Decreased step length - right;Decreased step length - left (increased postural sway at times, heavy reliance on RW, forward head posture)     General Gait Details: pt reporting 10/10 pain despite premedication and fatigued after working with OT so deferred more than stand pivot transfer; limited foot clearance bilaterally      Balance Overall balance assessment: Needs assistance Sitting-balance support: Feet supported;No upper extremity supported Sitting balance-Leahy Scale: Good     Standing balance support: Bilateral upper extremity supported Standing balance-Leahy Scale: Poor Standing balance comment: Two person mod assist in standing with posterior bias initially. No overt buckling but very painful, slow progress and poor B foot clearance and heavy reliance on RW          Cognition Arousal/Alertness: Awake/alert Behavior During Therapy: WFL for tasks assessed/performed Overall Cognitive Status:  Within  Functional Limits for tasks assessed              General Comments: fatigued, slow to process some cues (possibly due to pain meds) but good following of 1-step commands with increased time and fair 2-step command following. Decreased recall of some precautions will continue to reinforce, internally distracted by pain so tends to forget some safety cues.      Exercises General Exercises - Lower Extremity Ankle Circles/Pumps: AROM;Both;20 reps;Supine Quad Sets: AROM;Both;5 reps;Supine Short Arc Quad: AROM;Both;10 reps;Supine Long Arc Quad: AAROM;Both;5 reps;Seated Hip ABduction/ADduction: AAROM;Both;10 reps;Supine Straight Leg Raises: Both;5 reps;Supine;AAROM    General Comments General comments (skin integrity, edema, etc.): SpO2 and HR WNL on RA, no dizziness reported with transfers so BP not further assessed. Incision covered with dressing clean/dry/intact with some localized swelling noted, per surgeon note they are aware of swelling at surgery site.      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 PT Goal Formulation: With patient Time For Goal Achievement: 01/23/21 Potential to Achieve Goals: Good Progress towards PT goals: Progressing toward goals (pain/fatigue limiting)    Frequency    Min 5X/week      PT Plan Current plan remains appropriate       AM-PAC PT "6 Clicks" Mobility   Outcome  Measure  Help needed turning from your back to your side while in a flat bed without using bedrails?: A Little Help needed moving from lying on your back to sitting on the side of a flat bed without using bedrails?: A Little Help needed moving to and from a bed to a chair (including a wheelchair)?: A Lot Help needed standing up from a chair using your arms (e.g., wheelchair or bedside chair)?: A Lot Help needed to walk in hospital room?: A Lot Help needed climbing 3-5 steps with a railing? : Total 6 Click Score: 13    End of Session Equipment Utilized During Treatment: Gait belt Activity Tolerance: Patient limited by pain;Patient limited by fatigue Patient left: in bed;with call bell/phone within reach;with bed alarm set;with SCD's reapplied Nurse Communication: Mobility status;Patient requests pain meds;Other (comment) (+2 for safety) PT Visit Diagnosis: Muscle weakness (generalized) (M62.81);Difficulty in walking, not elsewhere classified (R26.2);Other symptoms and signs involving the nervous system (R29.898);Pain Pain - Right/Left: Right Pain - part of body:  (incisional and radiating to "tailbone")     Time: 0240-9735 PT Time Calculation (min) (ACUTE ONLY): 27 min  Charges:  $Therapeutic Exercise: 8-22 mins $Therapeutic Activity: 8-22 mins                     Kimmora Risenhoover P., PTA Acute Rehabilitation Services Pager: [phone] Office: [place] 01/10/2021, 2:27 PM

## 2021-01-10 NOTE — Plan of Care (Signed)

## 2021-01-10 NOTE — Progress Notes (Signed)
Occupational Therapy Treatment Patient Details Name: Antonio Oliver MRN: [id number] DOB: [birth year] Today's Date: 01/10/2021    History of present illness 65 y.o. male admitted under observation status on 01/05/21 for low back pain with R LE weakness, MRI completed and awaiting ortho consult.  Pt is now s/p central lumbar laminectomy L2-4 on 01/08/21. Pt with other significant PMH of AVR, bariatric surgery, L foot fx (cuneiform), HTN, glaucoma, fx of the L tibia s/p IM nail, complete heart block, charcot's deformity of the foot L, anxiety, neck surgery x 2, back surgery x 4.   OT comments  Pt. Seen for skilled OT session.  Motivated and eager for participation.  Able to simulated lb adl seated min a.  Amb. From recliner to b.room for toileting heavy mod a with cues for posture and rw management.  Notable fatigue at end of ambulation.  Utilized recliner to bring pt. Out of b.room once finished.  Wife present and states pt. Will need to be able to get around in the house to the b.room once home.  At this point he is heavy mod a for this task with shorter distance.  Will cont. To progress adls next session.  Follow Up Recommendations  Home health OT;Supervision/Assistance - 24 hour    Equipment Recommendations  3 in 1 bedside commode;Other (comment)    Recommendations for Other Services      Precautions / Restrictions Precautions Precautions: Fall;Back Precaution Comments: able to recall back precautions       Mobility Bed Mobility               General bed mobility comments: in recliner at beg. and end of session    Transfers Overall transfer level: Needs assistance Equipment used: Rolling walker (2 wheeled) Transfers: Sit to/from [facility] Sit to Stand: Mod assist Stand pivot transfers: Mod assist       General transfer comment: inital sit/stand from recliner post. lob but self corrected.  greater forward flexed posture  duringamb. with fatigue but able to  correct with cues.  moved slow and was visibly exausted when getting onto bsc he plopped and said " i couldnt" in reference to me cueing for arm rests. indicating he had give out. brought recliner into b.room for exit did not attempt amb. back out of b.room    Balance                                           ADL either performed or assessed with clinical judgement   ADL Overall ADL's : Needs assistance/impaired                     Lower Body Dressing: Sitting/lateral leans;Minimal assistance Lower Body Dressing Details (indicate cue type and reason): able to figure four/cross each leg over each knee and reach socks without breaking precautions. reviewed how to don shorts/under garments-looks like he could reach without a/e. wife present they report having and using a/e in the past but stated they dont have it now Toilet Transfer: Moderate assistance;Ambulation;RW;BSC;Regular Glass blower/designer Details (indicate cue type and reason): 3n1 over commods Toileting- Clothing Manipulation and Hygiene: Maximal assistance;Sit to/from stand       Functional mobility during ADLs: Moderate assistance;Maximal assistance;Rolling walker General ADL Comments: ambulated from recliner into b.room for toileting task. slow ambulation with max cues to adj. posture, noted to  get worse with fatigue. unable to wipe after bm without assistance     Vision       Perception     Praxis      Cognition Arousal/Alertness: Awake/alert Behavior During Therapy: WFL for tasks assessed/performed Overall Cognitive Status: Within Functional Limits for tasks assessed                                          Exercises     Shoulder Instructions       General Comments      Pertinent Vitals/ Pain       Pain Assessment: 0-10 Pain Score: 8  Pain Location: back Pain Descriptors / Indicators: Discomfort Pain Intervention(s): Premedicated before  session;Repositioned;Monitored during session;Limited activity within patient's tolerance  Home Living                                          Prior Functioning/Environment              Frequency  Min 2X/week        Progress Toward Goals  OT Goals(current goals can now be found in the care plan section)  Progress towards OT goals: Progressing toward goals     Plan      Co-evaluation                 AM-PAC OT "6 Clicks" Daily Activity     Outcome Measure   Help from another person eating meals?: None Help from another person taking care of personal grooming?: A Little Help from another person toileting, which includes using toliet, bedpan, or urinal?: A Lot Help from another person bathing (including washing, rinsing, drying)?: A Lot Help from another person to put on and taking off regular upper body clothing?: A Little Help from another person to put on and taking off regular lower body clothing?: A Lot 6 Click Score: 16    End of Session Equipment Utilized During Treatment: Gait belt;Rolling walker  OT Visit Diagnosis: Unsteadiness on feet (R26.81);Other abnormalities of gait and mobility (R26.89);Repeated falls (R29.6);Muscle weakness (generalized) (M62.81);History of falling (Z91.81);Pain   Activity Tolerance Patient tolerated treatment well   Patient Left in chair;with call bell/phone within reach;with family/visitor present   Nurse Communication          Time: 3354-5625 OT Time Calculation (min): 24 min  Charges: OT General Charges $OT Visit: 1 Visit OT Treatments $Self Care/Home Management : 23-37 mins  Sonia Baller, COTA/L Acute Rehabilitation [phone]  01/10/2021, 12:19 PM

## 2021-01-10 NOTE — Progress Notes (Signed)
     Subjective: 2 Days Post-Op Procedure(s) (LRB): CENTRAL LUMBAR LAMINECTOMY LUMBAR TWO-THREE, LUMBAR THREE-FOUR (N/A) Awake, alert and oriented x 4. [place] catheter is inplace, need to check a post void residual volumn, with bladder scan should be fine. Tolerating pos and meds. Pain is some better, walk to chair yesterday.  Patient reports pain as moderate.    Objective:   VITALS:  Temp:  [98 F (36.7 C)-98.2 F (36.8 C)] 98 F (36.7 C) (05/07 0757) Pulse Rate:  [71-101] 71 (05/07 0757) Resp:  [14-18] 18 (05/07 0757) BP: (106-130)/(67-86) 106/67 (05/07 0757) SpO2:  [96 %-100 %] 100 % (05/07 0757)  Neurologically intact ABD soft Neurovascular intact Sensation intact distally Intact pulses distally Dorsiflexion/Plantar flexion intact Incision: dressing C/D/I, no drainage and has an area of fluctuance at the top of the incision and this is likely a small hematoma, it should resorb over time no need to aspirate and may drain spontaneously, new dressing applied.  No cellulitis present   LABS Recent Labs    01/08/21 0444 01/09/21 0052  HGB 14.8 14.3  WBC 10.1 11.5*  PLT 221 209   Recent Labs    01/08/21 0444 01/09/21 0052  NA 138 135  K 4.6 5.1  CL 104 102  CO2 24 24  BUN 14 14  CREATININE 0.92 1.00  GLUCOSE 136* 162*   Recent Labs    01/08/21 0444  INR 1.0     Assessment/Plan: 2 Days Post-Op Procedure(s) (LRB): CENTRAL LUMBAR LAMINECTOMY LUMBAR TWO-THREE, LUMBAR THREE-FOUR (N/A)  Advance diet Up with therapy D/C IV fluids  Continue with PT and OT. Will likely be ready early next week for discharge as he regains his standing and walking tolerance.   [name] 01/10/2021, 10:08 AMPatient ID: Antonio Oliver, male   DOB: 08/21/1956, 65 y.o.   MRN: [id number]

## 2021-01-10 NOTE — Progress Notes (Signed)
PROGRESS NOTE    Antonio Oliver   [id number]  DOB: November 06, 1955  PCP: Antonio Oliver, Antonio Oliver    DOA: 01/05/2021 LOS: 2   Brief Narrative   As per H&P written by Dr. Marylyn Oliver 01/05/2021 "Antonio Oliver is a 65 y.o. male with medical history significant of DM2, HTN, HLD, neck surgery. Presenting with back pain and right lower extremity weakness. He reports that he woke up 5 days ago and could not walk. He went to an [facility] where he had a w/u that ruled out stroke. However, he was unable to get an MRI d/t a pacer. It was recommended that he follow up with a back specialist. He did regain some strength and ability to mobilize. So he followed up with his previous back specialist (Dr. Louanne Oliver) who ordered a MRI of his back to be completed on 5/10. This morning he woke up with worsening back pain and the inability to walk again. The pain was in his lower back and radiating down his right hip into his right leg. It was a burning pain that turned sharp with movement. He tried his percocet, but it didn't help. He spoke with his back team. It was recommended that he come to the ED. He denies any other aggravating or alleviating factors. "  Patient transferred to [facility] due to need for MRI with pacemaker present, and to be seen and evaluated by his back surgeon as well.  MRI Lumbar Spine IMPRESSION: 1. Since previous MRI of 2008, the patient has undergone laminectomy and PLIF from L4 through S1. The spinal canal and neural foramina are well decompressed at the operative levels, and the interbody fusion appears solid. 2. Interval development of adjacent segment disease at L3-4 with advanced bilateral facet hypertrophy contributing to moderate to severe multifactorial spinal stenosis. 3. Progressive disc and endplate degeneration asymmetric to the right at L2-3 with mild spinal stenosis and mild right foraminal narrowing. 4. Moderate bladder distension without demonstrated cause.     Assessment & Plan    Active Problems:   Back pain   Lumbar stenosis with neurogenic claudication   Low back pain with right lower extremity radiculopathy Hx of prior back surgery, now with acute worsening several days ago.  5/4 has ongoing severe pain.   MRI lumbar spine - see report - showed severe lumbar stenosis. 5/5 Central lumbar laminectomy at levels L2-3, L3-4 --Ortho following, Dr. Louanne Oliver  --Pain control per orders --Continue gabapentin and Cymbalta, Zalaflex --PT and OT as able  Constipation - Most likely due to opioids. Bowel regimen per orders  Essential hypertension - chronic, intermittently high likely due to pain.  Continue metoprolol.  Hyperlipidemia - continue atorvastatin  Type 2 diabetes - sliding scale Novolog  History of BPH -  Continue Proscar.  No reports of acute retention.   Anxiety/depression/Insomnia- stable.  Continue Wellbutrin, Rexulti, Klonopin PRN, Cymbalta, Remeron  Hx of migraines - continue Imitrex PRN  Hypothyroidism - continue levothyroxine  Patient BMI: Body mass index is 24.87 kg/m.   DVT prophylaxis: SCD's Start: 01/09/21 0006 SCDs Start: 01/05/21 1602   Diet:  Diet Orders (From admission, onward)    Start     Ordered   01/09/21 0505  Diet Carb Modified Fluid consistency: Thin; Room service appropriate? Yes  Diet effective now       Question Answer Comment  Diet-HS Snack? Nothing   Calorie Level Medium 1600-2000   Fluid consistency: Thin   Room service appropriate? Yes  01/09/21 0504            Code Status: Partial Code    Subjective 01/10/21    Pt up in recliner.  Reports still having uncontrolled pain. Best control is still about 7 out of 10 severity.  Denies any other acute complaints.  Wants to talk to his wife about CIR recommendation.    Disposition Plan & Communication   Status is: Inpatient  Inpatient appropriate due to severity of illness, post-op day 1 lumbar surgery, uncontrolled pain, requires CIR/rehab  placement.  Dispo: The patient is from: home              Anticipated d/c is to: CIR              Patient currently not medically stable for discharge   Difficult to place patient not expected   Family Communication: wife at bedside on rounds 5/4   Consults, Procedures, Significant Events   Consultants:   Orthopedics, Dr. Louanne Oliver  Procedures:   none  Antimicrobials:  Anti-infectives (From admission, onward)   Start     Dose/Rate Route Frequency Ordered Stop   01/09/21 0400  ceFAZolin (ANCEF) IVPB 2g/100 mL premix        2 g 200 mL/hr over 30 Minutes Intravenous Every 8 hours 01/09/21 0005 01/09/21 1204   01/08/21 1600  ceFAZolin (ANCEF) IVPB 2g/100 mL premix        2 g 200 mL/hr over 30 Minutes Intravenous To Short Stay 01/07/21 2348 01/08/21 1940        Micro    Objective   Vitals:   01/09/21 2045 01/10/21 0426 01/10/21 0757 01/10/21 1228  BP: 122/74 123/86 106/67 105/72  Pulse: 91 77 71 78  Resp: 16 14 18 18   Temp: 98 F (36.7 C) 98.2 F (36.8 C) 98 F (36.7 C) 98 F (36.7 C)  TempSrc: Oral Oral    SpO2: 96% 99% 100% 97%  Weight:      Height:        Intake/Output Summary (Last 24 hours) at 01/10/2021 1627 Last data filed at 01/10/2021 1500 Gross per 24 hour  Intake 2159.37 ml  Output 2500 ml  Net -340.63 ml   Filed Weights   01/05/21 1200 01/08/21 1753  Weight: 90.3 kg 90.3 kg    Physical Exam:  General exam: awake, alert, no acute distress, in recliver Respiratory system: CTAB without wheezes or rhonchi, normal respiratory effort, on room air. Cardiovascular system: regular rate rhythm, no peripheral edema.   Central nervous system: A&O x3. Grossly nonfocal exam. Normal speech Skin: area surrounding incision site healthy-appearing without erythema, induration or differential warmth  Labs   Data Reviewed: I have personally reviewed following labs and imaging studies  CBC: Recent Labs  Lab 01/05/21 1233 01/06/21 0414 01/08/21 0444  01/09/21 0052  WBC 8.4 7.5 10.1 11.5*  NEUTROABS 4.9  --  6.9  --   HGB 13.9 13.2 14.8 14.3  HCT 40.6 39.0 43.8 42.5  MCV 94.4 92.4 92.2 92.8  PLT 241 188 221 810   Basic Metabolic Panel: Recent Labs  Lab 01/05/21 1233 01/06/21 0414 01/08/21 0444 01/09/21 0052  NA 141 139 138 135  K 4.5 4.3 4.6 5.1  CL 108 106 104 102  CO2 25 26 24 24   GLUCOSE 128* 129* 136* 162*  BUN 12 10 14 14   CREATININE 0.89 1.00 0.92 1.00  CALCIUM 9.4 9.1 9.3 9.1   GFR: Estimated Creatinine Clearance: 90.4 mL/min (by C-G formula based  on SCr of 1 mg/dL). Liver Function Tests: Recent Labs  Lab 01/06/21 0414  AST 23  ALT 49*  ALKPHOS 60  BILITOT 0.8  PROT 5.4*  ALBUMIN 3.1*   No results for input(s): LIPASE, AMYLASE in the last 168 hours. No results for input(s): AMMONIA in the last 168 hours. Coagulation Profile: Recent Labs  Lab 01/08/21 0444  INR 1.0   Cardiac Enzymes: No results for input(s): CKTOTAL, CKMB, CKMBINDEX, TROPONINI in the last 168 hours. BNP (last 3 results) No results for input(s): PROBNP in the last 8760 hours. HbA1C: No results for input(s): HGBA1C in the last 72 hours. CBG: Recent Labs  Lab 01/09/21 1201 01/09/21 1646 01/09/21 2046 01/10/21 0624 01/10/21 1230  GLUCAP 162* 111* 187* 154* 115*   Lipid Profile: No results for input(s): CHOL, HDL, LDLCALC, TRIG, CHOLHDL, LDLDIRECT in the last 72 hours. Thyroid Function Tests: No results for input(s): TSH, T4TOTAL, FREET4, T3FREE, THYROIDAB in the last 72 hours. Anemia Panel: No results for input(s): VITAMINB12, FOLATE, FERRITIN, TIBC, IRON, RETICCTPCT in the last 72 hours. Sepsis Labs: No results for input(s): PROCALCITON, LATICACIDVEN in the last 168 hours.  Recent Results (from the past 240 hour(s))  Resp Panel by RT-PCR (Flu A&B, Covid) Nasopharyngeal Swab     Status: None   Collection Time: 01/05/21  1:35 PM   Specimen: Nasopharyngeal Swab; Nasopharyngeal(NP) swabs in vial transport medium  Result  Value Ref Range Status   SARS Coronavirus 2 by RT PCR NEGATIVE NEGATIVE Final    Comment: (NOTE) SARS-CoV-2 target nucleic acids are NOT DETECTED.  The SARS-CoV-2 RNA is generally detectable in upper respiratory specimens during the acute phase of infection. The lowest concentration of SARS-CoV-2 viral copies this assay can detect is 138 copies/mL. A negative result does not preclude SARS-Cov-2 infection and should not be used as the sole basis for treatment or other patient management decisions. A negative result may occur with  improper specimen collection/handling, submission of specimen other than nasopharyngeal swab, presence of viral mutation(s) within the areas targeted by this assay, and inadequate number of viral copies(<138 copies/mL). A negative result must be combined with clinical observations, patient history, and epidemiological information. The expected result is Negative.  Fact Sheet for Patients:  EntrepreneurPulse.com.au  Fact Sheet for Healthcare Providers:  IncredibleEmployment.be  This test is no t yet approved or cleared by the [place] FDA and  has been authorized for detection and/or diagnosis of SARS-CoV-2 by FDA under an Emergency Use Authorization (EUA). This EUA will remain  in effect (meaning this test can be used) for the duration of the COVID-19 declaration under Section 564(b)(1) of the Act, 21 U.S.C.section 360bbb-3(b)(1), unless the authorization is terminated  or revoked sooner.       Influenza A by PCR NEGATIVE NEGATIVE Final   Influenza B by PCR NEGATIVE NEGATIVE Final    Comment: (NOTE) The Xpert Xpress SARS-CoV-2/FLU/RSV plus assay is intended as an aid in the diagnosis of influenza from Nasopharyngeal swab specimens and should not be used as a sole basis for treatment. Nasal washings and aspirates are unacceptable for Xpert Xpress SARS-CoV-2/FLU/RSV testing.  Fact Sheet for  Patients: EntrepreneurPulse.com.au  Fact Sheet for Healthcare Providers: IncredibleEmployment.be  This test is not yet approved or cleared by the [place] FDA and has been authorized for detection and/or diagnosis of SARS-CoV-2 by FDA under an Emergency Use Authorization (EUA). This EUA will remain in effect (meaning this test can be used) for the duration of the COVID-19 declaration under  Section 564(b)(1) of the Act, 21 U.S.C. section 360bbb-3(b)(1), unless the authorization is terminated or revoked.  Performed at [facility], [place] [address] [place], [place] [address]   MRSA PCR Screening     Status: Abnormal   Collection Time: 01/08/21  5:43 AM   Specimen: Nasal Mucosa; Nasopharyngeal  Result Value Ref Range Status   MRSA by PCR POSITIVE (A) NEGATIVE Final    Comment:        The GeneXpert MRSA Assay (FDA approved for NASAL specimens only), is one component of a comprehensive MRSA colonization surveillance program. It is not intended to diagnose MRSA infection nor to guide or monitor treatment for MRSA infections. RESULT CALLED TO, READ BACK BY AND VERIFIED WITH: Julianne Handler RN 01/08/21 0654 JDW Performed at [facility], [place] [address] [place], [place] [address]       Imaging Studies   DG Lumbar Spine 2-3 Views  Addendum Date: 01/08/2021   ADDENDUM REPORT: 01/08/2021 20:59 ADDENDUM: These results were called by telephone at the time of interpretation on 01/08/2021 at 8:55 pm to provider [name] , who verbally acknowledged these results. Electronically Signed   By: Fidela Salisbury Antonio Oliver   On: 01/08/2021 20:59   Result Date: 01/08/2021 CLINICAL DATA:  L3-4 laminectomy.  Intraoperative examination. EXAM: LUMBAR SPINE - 2-3 VIEW COMPARISON:  12:49 a.m. FINDINGS: Portable cross-table lateral radiograph of the lumbar spine is presented. L4-S1 lumbar fusion with instrumentation has been performed. Metallic probe is  seen posterior to the L2-3 facet. Normal alignment. IMPRESSION: Metallic probe posterior to the L2-3 facet. Electronically Signed: By: Fidela Salisbury Antonio Oliver On: 01/08/2021 20:40     Medications   Scheduled Meds: . aspirin EC  81 mg Oral Daily  . atorvastatin  20 mg Oral q1800  . brexpiprazole  2 mg Oral Daily  . buPROPion  200 mg Oral BID  . busPIRone  20 mg Oral BID  . darifenacin  7.5 mg Oral Daily  . docusate sodium  100 mg Oral BID  . DULoxetine  60 mg Oral BID  . finasteride  5 mg Oral Daily  . folic acid  1 mg Oral Daily  . gabapentin  600-1,200 mg Oral TID  . insulin aspart  0-15 Units Subcutaneous TID WC  . insulin aspart  0-5 Units Subcutaneous QHS  . levothyroxine  125 mcg Oral Q0600  . magnesium oxide  400 mg Oral BID  . metoprolol succinate  25 mg Oral QHS  . mirtazapine  30 mg Oral QHS  . multivitamin with minerals  1 tablet Oral Daily  . mupirocin ointment   Nasal BID  . oxyCODONE-acetaminophen  1 tablet Oral Q6H   And  . oxyCODONE  5 mg Oral Q6H  . pantoprazole  40 mg Oral QHS  . polyethylene glycol  17 g Oral Daily  . senna-docusate  1 tablet Oral BID  . sodium chloride flush  3 mL Intravenous Q12H   Continuous Infusions: . sodium chloride 100 mL/hr at 01/09/21 0052  . sodium chloride Stopped (01/09/21 0054)  . methocarbamol (ROBAXIN) IV         LOS: 2 days    Time spent: 20 minutes    Ezekiel Slocumb, DO [facility]  01/10/2021, 4:27 PM      If 7PM-7AM, please contact night-coverage. How to contact the [facility] Attending or Consulting provider [place] or covering provider during after hours [place], for this patient?    1.  Check the care team in [facility] and look for a) attending/consulting [facility] provider listed and b) the [facility] team listed 2. Log into [email or web address] and use [place]'s universal password to access. If you do not have the password, please contact the hospital operator. 3. Locate the [facility] provider you are looking for under [facility] and page to a number that you can be directly reached. 4. If you still have difficulty reaching the provider, please page the [facility] (Director on Call) for the Hospitalists listed on amion for assistance.

## 2021-01-10 NOTE — Plan of Care (Signed)

## 2021-01-11 DIAGNOSIS — M48062 Spinal stenosis, lumbar region with neurogenic claudication: Secondary | ICD-10-CM | POA: Diagnosis not present

## 2021-01-11 LAB — GLUCOSE, CAPILLARY
Glucose-Capillary: 120 mg/dL — ABNORMAL HIGH (ref 70–99)
Glucose-Capillary: 95 mg/dL (ref 70–99)
Glucose-Capillary: 121 mg/dL — ABNORMAL HIGH (ref 70–99)
Glucose-Capillary: 154 mg/dL — ABNORMAL HIGH (ref 70–99)

## 2021-01-11 MED ORDER — ZOLPIDEM TARTRATE 5 MG PO TABS
10.0000 mg | ORAL_TABLET | Freq: Every day | ORAL | Status: DC
  Administered 2021-01-11 – 2021-01-12 (×2): 10 mg via ORAL
  Filled 2021-01-11 (×2): qty 2

## 2021-01-11 NOTE — Progress Notes (Signed)
Bedside shift report complete. Received patient awake,alert/orientedx4 and able to verbalize needs. NAD noted; respirations easy/even on room air. Dressing to lubar back c/d/i. Movement/sensation to all extremities noted. Whiteboard updated. All safety measures in place and personal belongings within reach.  

## 2021-01-11 NOTE — Progress Notes (Signed)
Physical Therapy Treatment Patient Details Name: Antonio Oliver MRN: [id number] DOB: 20-Nov-1955 Today's Date: 01/11/2021    History of Present Illness 65 y.o. male admitted under observation status on 01/05/21 for low back pain with R LE weakness, MRI completed and awaiting ortho consult.  Pt is now s/p central lumbar laminectomy L2-4 on 01/08/21. Pt with other significant PMH of AVR, bariatric surgery, L foot fx (cuneiform), HTN, glaucoma, fx of the L tibia s/p IM nail, complete heart block, charcot's deformity of the foot L, anxiety, neck surgery x 2, back surgery x 4.    PT Comments    Continuing work on functional mobility and activity tolerance;  Able to walk further this session; Still very dependent on UE support from RW, but no gross knee buckling noted; Seems to do better with his showe with custom insoles on   Follow Up Recommendations  CIR     Equipment Recommendations  3in1 (PT)    Recommendations for Other Services Rehab consult     Precautions / Restrictions Precautions Precautions: Fall;Back Precaution Booklet Issued: Yes (comment) Precaution Comments: Recalled 3/3 back prec Restrictions Other Position/Activity Restrictions: No bending, lifting, twisting, arching    Mobility  Bed Mobility Overal bed mobility: Needs Assistance Bed Mobility: Rolling;Sidelying to Sit Rolling: Min assist Sidelying to sit: Mod assist       General bed mobility comments: Light mod assist t elevate trunk to sit    Transfers Overall transfer level: Needs assistance Equipment used: Rolling walker (2 wheeled) Transfers: Sit to/from Stand Sit to Stand: Mod assist;+2 physical assistance;+2 safety/equipment         General transfer comment: Mod assist to support and help with power up to stand; cues for hand placement; less posterior lean noted  Ambulation/Gait Ambulation/Gait assistance: Min assist;+2 safety/equipment Gait Distance (Feet): 15 Feet (with one seted rest  break) Assistive device: Rolling walker (2 wheeled) Gait Pattern/deviations: Step-to pattern;Decreased step length - right;Decreased step length - left;Decreased dorsiflexion - left;Decreased dorsiflexion - right     General Gait Details: continuing foot clearance issues; no overt knee buckling this session; stopped to don pt's shoes with custom insloes   Stairs             Wheelchair Mobility    Modified Rankin (Stroke Patients Only)       Balance     Sitting balance-Leahy Scale: Good       Standing balance-Leahy Scale: Poor                              Cognition Arousal/Alertness: Awake/alert Behavior During Therapy: WFL for tasks assessed/performed Overall Cognitive Status: Within Functional Limits for tasks assessed                                        Exercises      General Comments        Pertinent Vitals/Pain Pain Assessment: 0-10 Pain Score: 7  Pain Location: back Pain Descriptors / Indicators: Discomfort;Grimacing Pain Intervention(s): Limited activity within patient's tolerance;Monitored during session    Home Living                      Prior Function            PT Goals (current goals can now be found in the care plan section) Acute Rehab  PT Goals Patient Stated Goal: 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 PT Goal Formulation: With patient Time For Goal Achievement: 01/23/21 Potential to Achieve Goals: Good Progress towards PT goals: Progressing toward goals    Frequency    Min 5X/week      PT Plan Current plan remains appropriate    Co-evaluation              AM-PAC PT "6 Clicks" Mobility   Outcome Measure  Help needed turning from your back to your side while in a flat bed without using bedrails?: A Little Help needed moving from lying on your back to sitting on the side of a flat bed without using bedrails?: A Little Help needed moving  to and from a bed to a chair (including a wheelchair)?: A Lot Help needed standing up from a chair using your arms (e.g., wheelchair or bedside chair)?: A Lot Help needed to walk in hospital room?: A Lot Help needed climbing 3-5 steps with a railing? : Total 6 Click Score: 13    End of Session Equipment Utilized During Treatment: Gait belt Activity Tolerance: Patient tolerated treatment well Patient left: in chair;with call bell/phone within reach Nurse Communication: Mobility status PT Visit Diagnosis: Muscle weakness (generalized) (M62.81);Difficulty in walking, not elsewhere classified (R26.2);Other symptoms and signs involving the nervous system (R29.898);Pain Pain - Right/Left: Right Pain - part of body:  (incisional and radiating to "tailbone")     Time: 1339-1400 PT Time Calculation (min) (ACUTE ONLY): 21 min  Charges:  $Gait Training: 8-22 mins                     Roney Marion, PT  Acute Rehabilitation Services Pager [phone] Office [place] 01/11/2021, 5:19 PM

## 2021-01-11 NOTE — Progress Notes (Signed)
PROGRESS NOTE    Antonio Oliver   UEA:[id number]  DOB: 05/02/1956  PCP: Cher Nakai, MD    DOA: 01/05/2021 LOS: 3   Brief Narrative   As per H&P written by Dr. Marylyn Ishihara 01/05/2021 "Antonio Oliver is a 65 y.o. male with medical history significant of DM2, HTN, HLD, neck surgery. Presenting with back pain and right lower extremity weakness. He reports that he woke up 5 days ago and could not walk. He went to an [facility] where he had a w/u that ruled out stroke. However, he was unable to get an MRI d/t a pacer. It was recommended that he follow up with a back specialist. He did regain some strength and ability to mobilize. So he followed up with his previous back specialist (Dr. Louanne Skye) who ordered a MRI of his back to be completed on 5/10. This morning he woke up with worsening back pain and the inability to walk again. The pain was in his lower back and radiating down his right hip into his right leg. It was a burning pain that turned sharp with movement. He tried his percocet, but it didn't help. He spoke with his back team. It was recommended that he come to the ED. He denies any other aggravating or alleviating factors. "  Patient transferred to [facility] due to need for MRI with pacemaker present, and to be seen and evaluated by his back surgeon as well.  MRI Lumbar Spine IMPRESSION: 1. Since previous MRI of 2008, the patient has undergone laminectomy and PLIF from L4 through S1. The spinal canal and neural foramina are well decompressed at the operative levels, and the interbody fusion appears solid. 2. Interval development of adjacent segment disease at L3-4 with advanced bilateral facet hypertrophy contributing to moderate to severe multifactorial spinal stenosis. 3. Progressive disc and endplate degeneration asymmetric to the right at L2-3 with mild spinal stenosis and mild right foraminal narrowing. 4. Moderate bladder distension without demonstrated cause.     Assessment & Plan    Active Problems:   Back pain   Lumbar stenosis with neurogenic claudication   Low back pain with right lower extremity radiculopathy Hx of prior back surgery, now with acute worsening several days ago.  5/4 has ongoing severe pain.   MRI lumbar spine - see report - showed severe lumbar stenosis. 5/5 Central lumbar laminectomy at levels L2-3, L3-4 --Ortho following, Dr. Louanne Skye  --Pain control per orders --Continue gabapentin and Cymbalta, Zalaflex --PT and OT as able  Constipation - Most likely due to opioids. Bowel regimen per orders  Insomnia - in hospital setting, maybe related to pain as well.   Trial Ambien.    Essential hypertension - chronic, intermittently high likely due to pain.  Continue metoprolol.  Hyperlipidemia - continue atorvastatin  Type 2 diabetes - sliding scale Novolog  History of BPH -  Continue Proscar.  No reports of acute retention.   Anxiety/depression/Insomnia- stable.  Continue Wellbutrin, Rexulti, Klonopin PRN, Cymbalta, Remeron  Hx of migraines - continue Imitrex PRN  Hypothyroidism - continue levothyroxine  Patient BMI: Body mass index is 24.87 kg/m.   DVT prophylaxis: SCD's Start: 01/09/21 0006 SCDs Start: 01/05/21 1602   Diet:  Diet Orders (From admission, onward)    Start     Ordered   01/09/21 0505  Diet Carb Modified Fluid consistency: Thin; Room service appropriate? Yes  Diet effective now       Question Answer Comment  Diet-HS Snack? Nothing  Calorie Level Medium 1600-2000   Fluid consistency: Thin   Room service appropriate? Yes      01/09/21 0504            Code Status: Partial Code    Subjective 01/11/21    Pt awake in bed when seen.  Reports a miserable night due to uncontrolled pain.  Thinks maybe working with therapy and being up yesterday aggravated things.  Says not sleeping much at all since here.   Disposition Plan & Communication   Status is: Inpatient  Inpatient appropriate due to severity of  illness, post-op day 1 lumbar surgery, uncontrolled pain, requires CIR/rehab placement.  Dispo: The patient is from: home              Anticipated d/c is to: CIR              Patient currently not medically stable for discharge   Difficult to place patient not expected   Family Communication: wife at bedside on rounds 5/4   Consults, Procedures, Significant Events   Consultants:   Orthopedics, Dr. Louanne Skye  Procedures:   none  Antimicrobials:  Anti-infectives (From admission, onward)   Start     Dose/Rate Route Frequency Ordered Stop   01/09/21 0400  ceFAZolin (ANCEF) IVPB 2g/100 mL premix        2 g 200 mL/hr over 30 Minutes Intravenous Every 8 hours 01/09/21 0005 01/09/21 1204   01/08/21 1600  ceFAZolin (ANCEF) IVPB 2g/100 mL premix        2 g 200 mL/hr over 30 Minutes Intravenous To Short Stay 01/07/21 2348 01/08/21 1940        Micro    Objective   Vitals:   01/10/21 0757 01/10/21 1228 01/10/21 1945 01/11/21 0457  BP: 106/67 105/72 136/90 120/77  Pulse: 71 78 90 73  Resp: 18 18  16   Temp: 98 F (36.7 C) 98 F (36.7 C) 98.3 F (36.8 C) 98.1 F (36.7 C)  TempSrc:   Oral Oral  SpO2: 100% 97%  98%  Weight:      Height:        Intake/Output Summary (Last 24 hours) at 01/11/2021 1520 Last data filed at 01/11/2021 0500 Gross per 24 hour  Intake --  Output 1800 ml  Net -1800 ml   Filed Weights   01/05/21 1200 01/08/21 1753  Weight: 90.3 kg 90.3 kg    Physical Exam:  General exam: awake, alert, no acute distress Respiratory system: normal respiratory effort, on room air. GI: non-distended soft Central nervous system: A&O x3. CN's grossly intact. Normal speech  Labs   Data Reviewed: I have personally reviewed following labs and imaging studies  CBC: Recent Labs  Lab 01/05/21 1233 01/06/21 0414 01/08/21 0444 01/09/21 0052  WBC 8.4 7.5 10.1 11.5*  NEUTROABS 4.9  --  6.9  --   HGB 13.9 13.2 14.8 14.3  HCT 40.6 39.0 43.8 42.5  MCV 94.4 92.4 92.2  92.8  PLT 241 188 221 350   Basic Metabolic Panel: Recent Labs  Lab 01/05/21 1233 01/06/21 0414 01/08/21 0444 01/09/21 0052  NA 141 139 138 135  K 4.5 4.3 4.6 5.1  CL 108 106 104 102  CO2 25 26 24 24   GLUCOSE 128* 129* 136* 162*  BUN 12 10 14 14   CREATININE 0.89 1.00 0.92 1.00  CALCIUM 9.4 9.1 9.3 9.1   GFR: Estimated Creatinine Clearance: 90.4 mL/min (by C-G formula based on SCr of 1 mg/dL). Liver Function Tests:  Recent Labs  Lab 01/06/21 0414  AST 23  ALT 49*  ALKPHOS 60  BILITOT 0.8  PROT 5.4*  ALBUMIN 3.1*   No results for input(s): LIPASE, AMYLASE in the last 168 hours. No results for input(s): AMMONIA in the last 168 hours. Coagulation Profile: Recent Labs  Lab 01/08/21 0444  INR 1.0   Cardiac Enzymes: No results for input(s): CKTOTAL, CKMB, CKMBINDEX, TROPONINI in the last 168 hours. BNP (last 3 results) No results for input(s): PROBNP in the last 8760 hours. HbA1C: No results for input(s): HGBA1C in the last 72 hours. CBG: Recent Labs  Lab 01/10/21 1230 01/10/21 1654 01/10/21 2024 01/11/21 0653 01/11/21 1147  GLUCAP 115* 144* 137* 120* 95   Lipid Profile: No results for input(s): CHOL, HDL, LDLCALC, TRIG, CHOLHDL, LDLDIRECT in the last 72 hours. Thyroid Function Tests: No results for input(s): TSH, T4TOTAL, FREET4, T3FREE, THYROIDAB in the last 72 hours. Anemia Panel: No results for input(s): VITAMINB12, FOLATE, FERRITIN, TIBC, IRON, RETICCTPCT in the last 72 hours. Sepsis Labs: No results for input(s): PROCALCITON, LATICACIDVEN in the last 168 hours.  Recent Results (from the past 240 hour(s))  Resp Panel by RT-PCR (Flu A&B, Covid) Nasopharyngeal Swab     Status: None   Collection Time: 01/05/21  1:35 PM   Specimen: Nasopharyngeal Swab; Nasopharyngeal(NP) swabs in vial transport medium  Result Value Ref Range Status   SARS Coronavirus 2 by RT PCR NEGATIVE NEGATIVE Final    Comment: (NOTE) SARS-CoV-2 target nucleic acids are NOT  DETECTED.  The SARS-CoV-2 RNA is generally detectable in upper respiratory specimens during the acute phase of infection. The lowest concentration of SARS-CoV-2 viral copies this assay can detect is 138 copies/mL. A negative result does not preclude SARS-Cov-2 infection and should not be used as the sole basis for treatment or other patient management decisions. A negative result may occur with  improper specimen collection/handling, submission of specimen other than nasopharyngeal swab, presence of viral mutation(s) within the areas targeted by this assay, and inadequate number of viral copies(<138 copies/mL). A negative result must be combined with clinical observations, patient history, and epidemiological information. The expected result is Negative.  Fact Sheet for Patients:  EntrepreneurPulse.com.au  Fact Sheet for Healthcare Providers:  IncredibleEmployment.be  This test is no t yet approved or cleared by the [place] FDA and  has been authorized for detection and/or diagnosis of SARS-CoV-2 by FDA under an Emergency Use Authorization (EUA). This EUA will remain  in effect (meaning this test can be used) for the duration of the COVID-19 declaration under Section 564(b)(1) of the Act, 21 U.S.C.section 360bbb-3(b)(1), unless the authorization is terminated  or revoked sooner.       Influenza A by PCR NEGATIVE NEGATIVE Final   Influenza B by PCR NEGATIVE NEGATIVE Final    Comment: (NOTE) The Xpert Xpress SARS-CoV-2/FLU/RSV plus assay is intended as an aid in the diagnosis of influenza from Nasopharyngeal swab specimens and should not be used as a sole basis for treatment. Nasal washings and aspirates are unacceptable for Xpert Xpress SARS-CoV-2/FLU/RSV testing.  Fact Sheet for Patients: EntrepreneurPulse.com.au  Fact Sheet for Healthcare Providers: IncredibleEmployment.be  This test is not yet  approved or cleared by the [place] FDA and has been authorized for detection and/or diagnosis of SARS-CoV-2 by FDA under an Emergency Use Authorization (EUA). This EUA will remain in effect (meaning this test can be used) for the duration of the COVID-19 declaration under Section 564(b)(1) of the Act, 21 U.S.C. section  360bbb-3(b)(1), unless the authorization is terminated or revoked.  Performed at [facility], [place] [address] [place], [place] [address]   MRSA PCR Screening     Status: Abnormal   Collection Time: 01/08/21  5:43 AM   Specimen: Nasal Mucosa; Nasopharyngeal  Result Value Ref Range Status   MRSA by PCR POSITIVE (A) NEGATIVE Final    Comment:        The GeneXpert MRSA Assay (FDA approved for NASAL specimens only), is one component of a comprehensive MRSA colonization surveillance program. It is not intended to diagnose MRSA infection nor to guide or monitor treatment for MRSA infections. RESULT CALLED TO, READ BACK BY AND VERIFIED WITH: Julianne Handler RN 01/08/21 0654 JDW Performed at [facility], [place] [address], [place], [place] [address]       Imaging Studies   No results found.   Medications   Scheduled Meds: . aspirin EC  81 mg Oral Daily  . atorvastatin  20 mg Oral q1800  . brexpiprazole  2 mg Oral Daily  . buPROPion  200 mg Oral BID  . busPIRone  20 mg Oral BID  . darifenacin  7.5 mg Oral Daily  . docusate sodium  100 mg Oral BID  . DULoxetine  60 mg Oral BID  . finasteride  5 mg Oral Daily  . folic acid  1 mg Oral Daily  . gabapentin  600-1,200 mg Oral TID  . insulin aspart  0-15 Units Subcutaneous TID WC  . insulin aspart  0-5 Units Subcutaneous QHS  . levothyroxine  125 mcg Oral Q0600  . magnesium oxide  400 mg Oral BID  . metoprolol succinate  25 mg Oral QHS  . mirtazapine  30 mg Oral QHS  . multivitamin with minerals  1 tablet Oral Daily  . mupirocin ointment   Nasal BID  . oxyCODONE-acetaminophen  1 tablet  Oral Q6H   And  . oxyCODONE  5 mg Oral Q6H  . pantoprazole  40 mg Oral QHS  . polyethylene glycol  17 g Oral Daily  . senna-docusate  1 tablet Oral BID  . sodium chloride flush  3 mL Intravenous Q12H  . zolpidem  10 mg Oral QHS   Continuous Infusions: . sodium chloride 100 mL/hr at 01/09/21 0052  . sodium chloride Stopped (01/09/21 0054)  . methocarbamol (ROBAXIN) IV         LOS: 3 days    Time spent: 20 minutes    Ezekiel Slocumb, DO [facility]  01/11/2021, 3:20 PM      If 7PM-7AM, please contact night-coverage. How to contact the [facility] Attending or Consulting provider [place] or covering provider during after hours [place], for this patient?    1. Check the care team in [facility] and look for a) attending/consulting [facility] provider listed and b) the [facility] team listed 2. Log into [email or web address] and use [place] universal password to access. If you do not have the password, please contact the hospital operator. 3. Locate the [facility] provider you are looking for under [facility] and page to a number that you can be directly reached. 4. If you still have difficulty reaching the provider, please page the [facility] (Director on Call) for the Hospitalists listed on amion for assistance.

## 2021-01-11 NOTE — Plan of Care (Signed)
  Problem: Education: Goal: Knowledge of General Education information will improve Description: Including pain rating scale, medication(s)/side effects and non-pharmacologic comfort measures Outcome: Progressing   Problem: Health Behavior/Discharge Planning: Goal: Ability to manage health-related needs will improve Outcome: Progressing   Problem: Clinical Measurements: Goal: Will remain free from infection Outcome: Progressing Goal: Diagnostic test results will improve Outcome: Progressing   Problem: Activity: Goal: Risk for activity intolerance will decrease Outcome: Progressing   Problem: Pain Managment: Goal: General experience of comfort will improve Outcome: Progressing   Problem: Safety: Goal: Ability to remain free from injury will improve Outcome: Progressing

## 2021-01-12 ENCOUNTER — Telehealth: Payer: Self-pay

## 2021-01-12 ENCOUNTER — Ambulatory Visit (INDEPENDENT_AMBULATORY_CARE_PROVIDER_SITE_OTHER): Payer: Medicare PPO

## 2021-01-12 ENCOUNTER — Telehealth: Payer: Self-pay | Admitting: Specialist

## 2021-01-12 DIAGNOSIS — I442 Atrioventricular block, complete: Secondary | ICD-10-CM | POA: Diagnosis not present

## 2021-01-12 DIAGNOSIS — M48062 Spinal stenosis, lumbar region with neurogenic claudication: Secondary | ICD-10-CM | POA: Diagnosis not present

## 2021-01-12 LAB — GLUCOSE, CAPILLARY
Glucose-Capillary: 189 mg/dL — ABNORMAL HIGH (ref 70–99)
Glucose-Capillary: 151 mg/dL — ABNORMAL HIGH (ref 70–99)
Glucose-Capillary: 168 mg/dL — ABNORMAL HIGH (ref 70–99)
Glucose-Capillary: 127 mg/dL — ABNORMAL HIGH (ref 70–99)

## 2021-01-12 NOTE — Progress Notes (Signed)
Occupational Therapy Treatment Patient Details Name: Antonio Oliver MRN: [id number] DOB: 03/18/1956 Today's Date: 01/12/2021    History of present illness 65 y.o. male admitted under observation status on 01/05/21 for low back pain with R LE weakness, MRI completed and awaiting ortho consult.  Pt is now s/p central lumbar laminectomy L2-4 on 01/08/21. Pt with other significant PMH of AVR, bariatric surgery, L foot fx (cuneiform), HTN, glaucoma, fx of the L tibia s/p IM nail, complete heart block, charcot's deformity of the foot L, anxiety, neck surgery x 2, back surgery x 4.   OT comments  Pt with CSW upon arrival, pt actively refusing SNF placement and plans to d/c home with wife. Wife verbalized that she will be home with him 24/7 for 1 week upon discharge, and her biggest concern is Antonio Oliver's ability to safety stand-pivot for safe tranfers. Session focused on caregiver education and training and functional transfers. Wife demonstrated good ability to provide min A for safe sit<>stand transfer with rw. Pt was min A/min guard for all close tranfers this session, no knee buckling noticed. Pt would benefit from AE training to increase indep in lower body dressing prior to d/c. Continued skilled OT services acutely to progress pt function to safely d/c home.     Follow Up Recommendations  Home health OT;Supervision/Assistance - 24 hour (Pt verbalized refusing SNF placement)    Equipment Recommendations  3 in 1 bedside commode;Other (comment) (AE)       Precautions / Restrictions Precautions Precautions: Fall;Back Precaution Booklet Issued: Yes (comment) Precaution Comments: Recalled 3/3 back prec Restrictions Weight Bearing Restrictions: No Other Position/Activity Restrictions: No bending, lifting, twisting, arching       Mobility Bed Mobility Overal bed mobility: Needs Assistance Bed Mobility: Rolling;Sidelying to Sit;Sit to Sidelying Rolling: Min guard Sidelying to sit: Min guard      Sit to sidelying: Min assist General bed mobility comments: vc for log roll technique, min A for LE management back into bed    Transfers Overall transfer level: Needs assistance Equipment used: Rolling walker (2 wheeled) Transfers: Sit to/from [facility] Sit to Stand: Min assist;Min guard         General transfer comment: Min A for boosting and balancing, close min guard for pivot    Balance Overall balance assessment: Needs assistance Sitting-balance support: Feet supported;No upper extremity supported Sitting balance-Leahy Scale: Good     Standing balance support: No upper extremity supported;During functional activity Standing balance-Leahy Scale: Poor Standing balance comment: Able to stand with arms by side for ~10 seconds prior to needing BUE support on rw          ADL either performed or assessed with clinical judgement   ADL Overall ADL's : Needs assistance/impaired        Toilet Transfer: Minimal assistance;Cueing for safety;Stand-pivot;RW;BSC           Functional mobility during ADLs: Minimal assistance;Rolling walker;Cueing for safety General ADL Comments: Session focused on sit<>stand and stand pivot transfers and care giver training to ensure safe transition into the home environment               Cognition Arousal/Alertness: Awake/alert Behavior During Therapy: [facility] for tasks assessed/performed Overall Cognitive Status: Within Functional Limits for tasks assessed                        General Comments no new skin integrity issues noted    Pertinent Vitals/ Pain  Pain Assessment: Faces Faces Pain Scale: Hurts a little bit Pain Location: back and hips Pain Descriptors / Indicators: Discomfort;Grimacing Pain Intervention(s): Limited activity within patient's tolerance;Monitored during session         Frequency  Min 2X/week        Progress Toward Goals  OT Goals(current goals can now be found in the  care plan section)     Acute Rehab OT Goals Patient Stated Goal: home OT Goal Formulation: With patient Time For Goal Achievement: 01/23/21 Potential to Achieve Goals: Fair ADL Goals Pt Will Perform Lower Body Bathing: with min guard assist;sit to/from stand Pt Will Perform Lower Body Dressing: with min guard assist;with adaptive equipment;sit to/from stand Pt Will Transfer to Toilet: with min guard assist;stand pivot transfer;bedside commode Pt Will Perform Toileting - Clothing Manipulation and hygiene: with min guard assist;sit to/from stand Additional ADL Goal #1: Pt will recall at least 3 fall prevention stratgies to prepare for a safe transition into the home environment  Plan Discharge plan remains appropriate       AM-PAC OT "6 Clicks" Daily Activity     Outcome Measure   Help from another person eating meals?: None Help from another person taking care of personal grooming?: A Little Help from another person toileting, which includes using toliet, bedpan, or urinal?: A Lot Help from another person bathing (including washing, rinsing, drying)?: A Lot Help from another person to put on and taking off regular upper body clothing?: A Little Help from another person to put on and taking off regular lower body clothing?: A Lot 6 Click Score: 16    End of Session Equipment Utilized During Treatment: Gait belt;Rolling walker  OT Visit Diagnosis: Unsteadiness on feet (R26.81);Other abnormalities of gait and mobility (R26.89);Repeated falls (R29.6);Muscle weakness (generalized) (M62.81);History of falling (Z91.81);Pain Pain - Right/Left:  (back) Pain - part of body:  (back)   Activity Tolerance Patient tolerated treatment well   Patient Left in chair;with call bell/phone within reach;with family/visitor present   Nurse Communication Mobility status        Time: 8295-6213 OT Time Calculation (min): 26 min  Charges: OT General Charges $OT Visit: 1 Visit OT  Treatments $Self Care/Home Management : 23-37 mins    Asucena Galer A Finnian Husted 01/12/2021, 4:29 PM

## 2021-01-12 NOTE — Progress Notes (Signed)
PROGRESS NOTE    Antonio Oliver   BJY:[id number]  DOB: 04/17/1956  PCP: Cher Nakai, MD    DOA: 01/05/2021 LOS: 4   Brief Narrative   As per H&P written by Dr. Marylyn Ishihara 01/05/2021 "Antonio Oliver is a 65 y.o. male with medical history significant of DM2, HTN, HLD, neck surgery. Presenting with back pain and right lower extremity weakness. He reports that he woke up 5 days ago and could not walk. He went to an [facility] where he had a w/u that ruled out stroke. However, he was unable to get an MRI d/t a pacer. It was recommended that he follow up with a back specialist. He did regain some strength and ability to mobilize. So he followed up with his previous back specialist (Dr. Louanne Skye) who ordered a MRI of his back to be completed on 5/10. This morning he woke up with worsening back pain and the inability to walk again. The pain was in his lower back and radiating down his right hip into his right leg. It was a burning pain that turned sharp with movement. He tried his percocet, but it didn't help. He spoke with his back team. It was recommended that he come to the ED. He denies any other aggravating or alleviating factors. "  Patient transferred to [facility] due to need for MRI with pacemaker present, and to be seen and evaluated by his back surgeon as well.  MRI Lumbar Spine IMPRESSION: 1. Since previous MRI of 2008, the patient has undergone laminectomy and PLIF from L4 through S1. The spinal canal and neural foramina are well decompressed at the operative levels, and the interbody fusion appears solid. 2. Interval development of adjacent segment disease at L3-4 with advanced bilateral facet hypertrophy contributing to moderate to severe multifactorial spinal stenosis. 3. Progressive disc and endplate degeneration asymmetric to the right at L2-3 with mild spinal stenosis and mild right foraminal narrowing. 4. Moderate bladder distension without demonstrated cause.     Assessment & Plan    Active Problems:   Back pain   Lumbar stenosis with neurogenic claudication   Low back pain with right lower extremity radiculopathy Hx of prior back surgery, now with acute worsening several days ago.  5/4 has ongoing severe pain.   MRI lumbar spine - see report - showed severe lumbar stenosis. 5/5 Central lumbar laminectomy at levels L2-3, L3-4 --Ortho following, Dr. Louanne Skye  --Pain control per orders --Continue gabapentin and Cymbalta, Zalaflex --PT and OT as able  Constipation - Most likely due to opioids. Bowel regimen per orders  Insomnia - in hospital setting, maybe related to pain as well.   Trial Ambien.    Essential hypertension - chronic, intermittently high likely due to pain.  Continue metoprolol.  Hyperlipidemia - continue atorvastatin  Type 2 diabetes - sliding scale Novolog  History of BPH -  Continue Proscar.  No reports of acute retention.   Anxiety/depression/Insomnia- stable.  Continue Wellbutrin, Rexulti, Klonopin PRN, Cymbalta, Remeron  Hx of migraines - continue Imitrex PRN  Hypothyroidism - continue levothyroxine  Patient BMI: Body mass index is 24.87 kg/m.   DVT prophylaxis: SCD's Start: 01/09/21 0006 SCDs Start: 01/05/21 1602   Diet:  Diet Orders (From admission, onward)    Start     Ordered   01/09/21 0505  Diet Carb Modified Fluid consistency: Thin; Room service appropriate? Yes  Diet effective now       Question Answer Comment  Diet-HS Snack? Nothing  Calorie Level Medium 1600-2000   Fluid consistency: Thin   Room service appropriate? Yes      01/09/21 0504            Code Status: Partial Code    Subjective 01/12/21    Pt finally slept well last night after trial of Ambien.  Says he dreamt he got elected president.  Pain still ongoing but very slowly improving.  No other acute complaints.   Disposition Plan & Communication   Status is: Inpatient  Inpatient appropriate due to severity of illness, need for CIR or SNF  placement  Dispo: The patient is from: home              Anticipated d/c is to: CIR               Patient currently not medically stable for discharge   Difficult to place patient not expected   Family Communication: wife at bedside on rounds today   Consults, Procedures, Significant Events   Consultants:   Orthopedics, Dr. Louanne Skye  Procedures:   none  Antimicrobials:  Anti-infectives (From admission, onward)   Start     Dose/Rate Route Frequency Ordered Stop   01/09/21 0400  ceFAZolin (ANCEF) IVPB 2g/100 mL premix        2 g 200 mL/hr over 30 Minutes Intravenous Every 8 hours 01/09/21 0005 01/09/21 1204   01/08/21 1600  ceFAZolin (ANCEF) IVPB 2g/100 mL premix        2 g 200 mL/hr over 30 Minutes Intravenous To Short Stay 01/07/21 2348 01/08/21 1940        Micro    Objective   Vitals:   01/12/21 0944 01/12/21 1500 01/12/21 1541 01/12/21 1544  BP: 128/89 128/73 128/78 128/78  Pulse: 86 78 86 85  Resp: 18 19  18   Temp:  98 F (36.7 C)  98 F (36.7 C)  TempSrc:  Oral    SpO2: 96% 98% 97% 96%  Weight:      Height:        Intake/Output Summary (Last 24 hours) at 01/12/2021 1641 Last data filed at 01/12/2021 1500 Gross per 24 hour  Intake 483 ml  Output 1600 ml  Net -1117 ml   Filed Weights   01/05/21 1200 01/08/21 1753  Weight: 90.3 kg 90.3 kg    Physical Exam:  General exam: awake, alert, no acute distress Respiratory system: normal respiratory effort, on room air. GI: non-distended soft Central nervous system: A&O x3. CN's grossly intact. Normal speech  Labs   Data Reviewed: I have personally reviewed following labs and imaging studies  CBC: Recent Labs  Lab 01/06/21 0414 01/08/21 0444 01/09/21 0052  WBC 7.5 10.1 11.5*  NEUTROABS  --  6.9  --   HGB 13.2 14.8 14.3  HCT 39.0 43.8 42.5  MCV 92.4 92.2 92.8  PLT 188 221 643   Basic Metabolic Panel: Recent Labs  Lab 01/06/21 0414 01/08/21 0444 01/09/21 0052  NA 139 138 135  K 4.3 4.6 5.1   CL 106 104 102  CO2 26 24 24   GLUCOSE 129* 136* 162*  BUN 10 14 14   CREATININE 1.00 0.92 1.00  CALCIUM 9.1 9.3 9.1   GFR: Estimated Creatinine Clearance: 90.4 mL/min (by C-G formula based on SCr of 1 mg/dL). Liver Function Tests: Recent Labs  Lab 01/06/21 0414  AST 23  ALT 49*  ALKPHOS 60  BILITOT 0.8  PROT 5.4*  ALBUMIN 3.1*   No results for input(s): LIPASE,  AMYLASE in the last 168 hours. No results for input(s): AMMONIA in the last 168 hours. Coagulation Profile: Recent Labs  Lab 01/08/21 0444  INR 1.0   Cardiac Enzymes: No results for input(s): CKTOTAL, CKMB, CKMBINDEX, TROPONINI in the last 168 hours. BNP (last 3 results) No results for input(s): PROBNP in the last 8760 hours. HbA1C: No results for input(s): HGBA1C in the last 72 hours. CBG: Recent Labs  Lab 01/11/21 1654 01/11/21 2127 01/12/21 0853 01/12/21 1142 01/12/21 1624  GLUCAP 121* 154* 189* 151* 168*   Lipid Profile: No results for input(s): CHOL, HDL, LDLCALC, TRIG, CHOLHDL, LDLDIRECT in the last 72 hours. Thyroid Function Tests: No results for input(s): TSH, T4TOTAL, FREET4, T3FREE, THYROIDAB in the last 72 hours. Anemia Panel: No results for input(s): VITAMINB12, FOLATE, FERRITIN, TIBC, IRON, RETICCTPCT in the last 72 hours. Sepsis Labs: No results for input(s): PROCALCITON, LATICACIDVEN in the last 168 hours.  Recent Results (from the past 240 hour(s))  Resp Panel by RT-PCR (Flu A&B, Covid) Nasopharyngeal Swab     Status: None   Collection Time: 01/05/21  1:35 PM   Specimen: Nasopharyngeal Swab; Nasopharyngeal(NP) swabs in vial transport medium  Result Value Ref Range Status   SARS Coronavirus 2 by RT PCR NEGATIVE NEGATIVE Final    Comment: (NOTE) SARS-CoV-2 target nucleic acids are NOT DETECTED.  The SARS-CoV-2 RNA is generally detectable in upper respiratory specimens during the acute phase of infection. The lowest concentration of SARS-CoV-2 viral copies this assay can detect  is 138 copies/mL. A negative result does not preclude SARS-Cov-2 infection and should not be used as the sole basis for treatment or other patient management decisions. A negative result may occur with  improper specimen collection/handling, submission of specimen other than nasopharyngeal swab, presence of viral mutation(s) within the areas targeted by this assay, and inadequate number of viral copies(<138 copies/mL). A negative result must be combined with clinical observations, patient history, and epidemiological information. The expected result is Negative.  Fact Sheet for Patients:  EntrepreneurPulse.com.au  Fact Sheet for Healthcare Providers:  IncredibleEmployment.be  This test is no t yet approved or cleared by the [place] FDA and  has been authorized for detection and/or diagnosis of SARS-CoV-2 by FDA under an Emergency Use Authorization (EUA). This EUA will remain  in effect (meaning this test can be used) for the duration of the COVID-19 declaration under Section 564(b)(1) of the Act, 21 U.S.C.section 360bbb-3(b)(1), unless the authorization is terminated  or revoked sooner.       Influenza A by PCR NEGATIVE NEGATIVE Final   Influenza B by PCR NEGATIVE NEGATIVE Final    Comment: (NOTE) The Xpert Xpress SARS-CoV-2/FLU/RSV plus assay is intended as an aid in the diagnosis of influenza from Nasopharyngeal swab specimens and should not be used as a sole basis for treatment. Nasal washings and aspirates are unacceptable for Xpert Xpress SARS-CoV-2/FLU/RSV testing.  Fact Sheet for Patients: EntrepreneurPulse.com.au  Fact Sheet for Healthcare Providers: IncredibleEmployment.be  This test is not yet approved or cleared by the [place] FDA and has been authorized for detection and/or diagnosis of SARS-CoV-2 by FDA under an Emergency Use Authorization (EUA). This EUA will remain in effect  (meaning this test can be used) for the duration of the COVID-19 declaration under Section 564(b)(1) of the Act, 21 U.S.C. section 360bbb-3(b)(1), unless the authorization is terminated or revoked.  Performed at [facility], [place] [address] [place], [place] [address]   MRSA PCR Screening  Status: Abnormal   Collection Time: 01/08/21  5:43 AM   Specimen: Nasal Mucosa; Nasopharyngeal  Result Value Ref Range Status   MRSA by PCR POSITIVE (A) NEGATIVE Final    Comment:        The GeneXpert MRSA Assay (FDA approved for NASAL specimens only), is one component of a comprehensive MRSA colonization surveillance program. It is not intended to diagnose MRSA infection nor to guide or monitor treatment for MRSA infections. RESULT CALLED TO, READ BACK BY AND VERIFIED WITH: Julianne Handler RN 01/08/21 0654 JDW Performed at [facility], [place] [address] [place], [place] [address]       Imaging Studies   No results found.   Medications   Scheduled Meds: . aspirin EC  81 mg Oral Daily  . atorvastatin  20 mg Oral q1800  . brexpiprazole  2 mg Oral Daily  . buPROPion  200 mg Oral BID  . busPIRone  20 mg Oral BID  . darifenacin  7.5 mg Oral Daily  . docusate sodium  100 mg Oral BID  . DULoxetine  60 mg Oral BID  . finasteride  5 mg Oral Daily  . folic acid  1 mg Oral Daily  . gabapentin  600-1,200 mg Oral TID  . insulin aspart  0-15 Units Subcutaneous TID WC  . insulin aspart  0-5 Units Subcutaneous QHS  . levothyroxine  125 mcg Oral Q0600  . magnesium oxide  400 mg Oral BID  . metoprolol succinate  25 mg Oral QHS  . mirtazapine  30 mg Oral QHS  . multivitamin with minerals  1 tablet Oral Daily  . mupirocin ointment   Nasal BID  . oxyCODONE-acetaminophen  1 tablet Oral Q6H   And  . oxyCODONE  5 mg Oral Q6H  . pantoprazole  40 mg Oral QHS  . polyethylene glycol  17 g Oral Daily  . senna-docusate  1 tablet Oral BID  . sodium chloride flush  3 mL Intravenous  Q12H  . zolpidem  10 mg Oral QHS   Continuous Infusions: . sodium chloride 100 mL/hr at 01/09/21 0052  . sodium chloride Stopped (01/09/21 0054)  . methocarbamol (ROBAXIN) IV         LOS: 4 days    Time spent: 20 minutes    Ezekiel Slocumb, DO [facility]  01/12/2021, 4:41 PM      If 7PM-7AM, please contact night-coverage. How to contact the [facility] Attending or Consulting provider [place] or covering provider during after hours [place], for this patient?    1. Check the care team in [facility] and look for a) attending/consulting TRH provider listed and b) the [facility] team listed 2. Log into [email or web address] and use [place]'s universal password to access. If you do not have the password, please contact the hospital operator. 3. Locate the [facility] provider you are looking for under [facility] and page to a number that you can be directly reached. 4. If you still have difficulty reaching the provider, please page the [facility] (Director on Call) for the Hospitalists listed on amion for assistance.

## 2021-01-12 NOTE — Progress Notes (Signed)
Inpatient Rehab Admissions Coordinator:   Following from a distance for medical workup.  Pt continues to lack a diagnosis amenable to CIR, but await results of MRI tomorrow.    Shann Medal, PT, DPT Admissions Coordinator [phone] 01/12/21  9:56 AM

## 2021-01-12 NOTE — NC FL2 (Signed)
[place] LEVEL OF CARE SCREENING TOOL     IDENTIFICATION  Patient Name: Antonio Oliver Birthdate: 02-Mar-1956 Sex: male Admission Date ([place] Location): 01/05/2021  [facility] and [place] Number:  Herbalist and Address:  The . [facility], [place] [address] [place], [place] [address]      Provider Number: [id number]  Attending Physician Name and Address:  Ezekiel Slocumb, DO  Relative Name and Phone Number:  Antonio Oliver, [phone]    Oliver Level of Care: Hospital Recommended Level of Care: [place] Prior Approval Number:    Date Approved/Denied:   PASRR Number: pending  Discharge Plan: SNF    [facility] Diagnoses: Patient Active Problem List   Diagnosis Date Noted  . Lumbar stenosis with neurogenic claudication 01/08/2021  . Back pain 01/05/2021  . BPH (benign prostatic hyperplasia)   . Depression   . Hypertension   . Presence of permanent cardiac pacemaker 12/28/2019  . Ascending aorta dilation ([place]) 03/06/2019  . Charcot's joint of foot 03/05/2019  . Fracture of tibia 03/05/2019  . Fracture of capitate bone of wrist 02/26/2019  . Syncope 02/14/2019  . Bradycardia 02/01/2019  . Weakness   . Leukocytosis   . Nondisplaced fracture of medial cuneiform of left foot, initial encounter for closed fracture 11/16/2018  . Cervical myelopathy ([place]) 11/14/2018  . Hyperlipidemia 10/30/2018  . Vitamin D deficiency 10/30/2018  . Osteoarthritis 10/30/2018  . GERD (gastroesophageal reflux disease) 10/30/2018  . Fatigue 10/30/2018  . Hypothyroidism 10/30/2018  . Essential hypertension 10/30/2018  . Mixed dyslipidemia 10/30/2018  . Hemangioma of skin and subcutaneous tissue 07/07/2017  . Lentigo 07/07/2017  . Multiple actinic keratoses 07/07/2017  . Multiple benign melanocytic nevi 07/07/2017  . Complete heart block ([place]) 05/04/2017  . S/P AVR (aortic valve replacement) 03/10/2017  . Bicuspid aortic valve  12/22/2016  . Benign prostatic hyperplasia without lower urinary tract symptoms 11/17/2016  . Chronic inflammatory arthritis 11/17/2016  . Chronic bilateral low back pain 11/17/2016  . Heart murmur 11/17/2016  . History of kidney stones 11/17/2016  . Migraine 11/17/2016  . Anxiety 06/20/2013  . Enthesopathy of ankle and tarsus 04/04/2013  . Lesion of plantar nerve 04/04/2013  . Personal history of tobacco use, presenting hazards to health 04/04/2013  . Strain of rotator cuff 04/04/2013  . Neck pain 04/04/2013  . Status post bariatric surgery 04/04/2013  . DM2 (diabetes mellitus, type 2) ([place]) 04/03/2013  . Chronic back pain 04/03/2013  . Glaucoma 04/03/2013  . Morbid obesity ([place]) 10/27/2011    Orientation RESPIRATION BLADDER Height & Weight     Self,Time,Situation,Place  Normal Incontinent,External catheter Weight: 199 lb (90.3 kg) Height:  6\' 3"  (190.5 cm)  BEHAVIORAL SYMPTOMS/MOOD NEUROLOGICAL BOWEL NUTRITION STATUS      Incontinent Diet (See DC summary)  AMBULATORY STATUS COMMUNICATION OF NEEDS Skin   Extensive Assist Verbally Surgical wounds,Skin abrasions (Back SI w/ dressing, Bilateral arm and leg abrasions)                       Personal Care Assistance Level of Assistance  Bathing,Feeding,Dressing Bathing Assistance: Maximum assistance Feeding assistance: Independent Dressing Assistance: Maximum assistance     Functional Limitations Info  Sight,Speech,Hearing Sight Info: Adequate Hearing Info: Adequate Speech Info: Adequate    SPECIAL CARE FACTORS FREQUENCY  PT (By licensed PT),OT (By licensed OT)     PT Frequency: 5x week OT Frequency: 5x week  Contractures Contractures Info: Not present    Additional Factors Info  Code Status,Allergies,Insulin Sliding Scale,Psychotropic Code Status Info: Partial Allergies Info: Morphine Psychotropic Info: Wellbutrin, Duloxetine, Buspar Insulin Sliding Scale Info: Insulin Aspart (Novolog) 0-15 U  3x daily, 0-5 U at bedtime.       O[facility] Medications (01/12/2021):  This is the Oliver hospital active medication list [facility] Facility-Administered Medications  Medication Dose Route Frequency Provider Last Rate Last Admin  . 0.9 %  sodium chloride infusion   Intravenous Continuous Jessy Oto, MD 100 mL/hr at 01/09/21 0052 New Bag at 01/09/21 0052  . 0.9 %  sodium chloride infusion  250 mL Intravenous Continuous Jessy Oto, MD   Stopped at 01/09/21 [phone]  . acetaminophen (TYLENOL) tablet 650 mg  650 mg Oral Q4H PRN Jessy Oto, MD       Or  . acetaminophen (TYLENOL) suppository 650 mg  650 mg Rectal Q4H PRN Jessy Oto, MD      . alum & mag hydroxide-simeth (MAALOX/MYLANTA) 200-200-20 MG/5ML suspension 30 mL  30 mL Oral Q6H PRN Jessy Oto, MD      . aspirin EC tablet 81 mg  81 mg Oral Daily Jessy Oto, MD   81 mg at 01/12/21 1038  . atorvastatin (LIPITOR) tablet 20 mg  20 mg Oral q1800 Jessy Oto, MD   20 mg at 01/11/21 1604  . bisacodyl (DULCOLAX) EC tablet 5 mg  5 mg Oral Daily PRN Jessy Oto, MD      . brexpiprazole (REXULTI) tablet 2 mg  2 mg Oral Daily Jessy Oto, MD   2 mg at 01/12/21 1039  . buPROPion (WELLBUTRIN SR) 12 hr tablet 200 mg  200 mg Oral BID Jessy Oto, MD   200 mg at 01/12/21 0604  . busPIRone (BUSPAR) tablet 20 mg  20 mg Oral BID Jessy Oto, MD   20 mg at 01/12/21 1038  . clonazePAM (KLONOPIN) tablet 0.5 mg  0.5 mg Oral BID PRN Jessy Oto, MD   0.5 mg at 01/12/21 1140  . darifenacin (ENABLEX) 24 hr tablet 7.5 mg  7.5 mg Oral Daily Jessy Oto, MD   7.5 mg at 01/12/21 1039  . docusate sodium (COLACE) capsule 100 mg  100 mg Oral BID Jessy Oto, MD   100 mg at 01/12/21 1038  . DULoxetine (CYMBALTA) DR capsule 60 mg  60 mg Oral BID Jessy Oto, MD   60 mg at 01/12/21 1039  . finasteride (PROSCAR) tablet 5 mg  5 mg Oral Daily Jessy Oto, MD   5 mg at 01/12/21 1039  . folic acid (FOLVITE) tablet 1 mg  1 mg Oral Daily  Jessy Oto, MD   1 mg at 01/12/21 1039  . gabapentin (NEURONTIN) capsule 600-1,200 mg  600-1,200 mg Oral TID Jessy Oto, MD   600 mg at 01/12/21 1039  . HYDROmorphone (DILAUDID) injection 1 mg  1 mg Intravenous Q2H PRN Jessy Oto, MD   1 mg at 01/11/21 1604  . insulin aspart (novoLOG) injection 0-15 Units  0-15 Units Subcutaneous TID WC Jessy Oto, MD   3 Units at 01/10/21 0840  . insulin aspart (novoLOG) injection 0-5 Units  0-5 Units Subcutaneous QHS Jessy Oto, MD      . levothyroxine (SYNTHROID) tablet 125 mcg  125 mcg Oral Q0600 Jessy Oto, MD   125 mcg at 01/12/21 0604  . magnesium oxide (  MAG-OX) tablet 400 mg  400 mg Oral BID Jessy Oto, MD   400 mg at 01/12/21 0746  . menthol-cetylpyridinium (CEPACOL) lozenge 3 mg  1 lozenge Oral PRN Jessy Oto, MD       Or  . phenol (CHLORASEPTIC) mouth spray 1 spray  1 spray Mouth/Throat PRN Jessy Oto, MD      . methocarbamol (ROBAXIN) tablet 500 mg  500 mg Oral Q6H PRN Jessy Oto, MD       Or  . methocarbamol (ROBAXIN) 500 mg in dextrose 5 % 50 mL IVPB  500 mg Intravenous Q6H PRN Jessy Oto, MD      . metoprolol succinate (TOPROL-XL) 24 hr tablet 25 mg  25 mg Oral QHS Jessy Oto, MD   25 mg at 01/11/21 2129  . mirtazapine (REMERON) tablet 30 mg  30 mg Oral QHS Jessy Oto, MD   30 mg at 01/11/21 2129  . multivitamin with minerals tablet 1 tablet  1 tablet Oral Daily Jessy Oto, MD   1 tablet at 01/12/21 1038  . mupirocin ointment (BACTROBAN) 2 %   Nasal BID Nicole Kindred A, DO   1 application at 48/54/62 1039  . ondansetron (ZOFRAN) tablet 4 mg  4 mg Oral Q6H PRN Jessy Oto, MD       Or  . ondansetron [facility]) injection 4 mg  4 mg Intravenous Q6H PRN Jessy Oto, MD      . ondansetron [facility]) tablet 4 mg  4 mg Oral Q6H PRN Jessy Oto, MD       Or  . ondansetron [facility]) injection 4 mg  4 mg Intravenous Q6H PRN Jessy Oto, MD      . oxyCODONE-acetaminophen (PERCOCET/ROXICET)  5-325 MG per tablet 1 tablet  1 tablet Oral Q6H Jessy Oto, MD   1 tablet at 01/12/21 1330   And  . oxyCODONE (Oxy IR/ROXICODONE) immediate release tablet 5 mg  5 mg Oral Q6H Jessy Oto, MD   5 mg at 01/12/21 1330  . pantoprazole (PROTONIX) EC tablet 40 mg  40 mg Oral QHS Nicole Kindred A, DO   40 mg at 01/11/21 2129  . polyethylene glycol (MIRALAX / GLYCOLAX) packet 17 g  17 g Oral Daily Nicole Kindred A, DO   17 g at 01/11/21 7035  . senna-docusate (Senokot-S) tablet 1 tablet  1 tablet Oral BID Nicole Kindred A, DO   1 tablet at 01/12/21 1038  . sodium chloride flush (NS) 0.9 % injection 3 mL  3 mL Intravenous Q12H Jessy Oto, MD   3 mL at 01/12/21 1040  . sodium chloride flush (NS) 0.9 % injection 3 mL  3 mL Intravenous PRN Jessy Oto, MD      . sodium phosphate (FLEET) 7-19 GM/118ML enema 1 enema  1 enema Rectal Once PRN Jessy Oto, MD      . SUMAtriptan (IMITREX) tablet 100 mg  100 mg Oral Q2H PRN Jessy Oto, MD      . tiZANidine (ZANAFLEX) tablet 4-8 mg  4-8 mg Oral TID PRN Jessy Oto, MD   4 mg at 01/11/21 0201  . zolpidem (AMBIEN) tablet 10 mg  10 mg Oral QHS Nicole Kindred A, DO   10 mg at 01/11/21 2129     Discharge Medications: Please see discharge summary for a list of discharge medications.  Relevant Imaging Results:  Relevant Lab Results:   Additional Information  SS# [place] Margaruite Top, LCSW

## 2021-01-12 NOTE — Progress Notes (Signed)
Bedside shift report complete. Received patient awake,alert/orientedx4 and able to verbalize needs. NAD noted; respirations easy/even on room air. Dressing to lubar back c/d/i. Movement/sensation to all extremities noted. Whiteboard updated. All safety measures in place and personal belongings within reach.

## 2021-01-12 NOTE — Telephone Encounter (Signed)
Patient's wife [name] called requesting a return call from Phil Campbell. She states she has medical question. Please call patient at [phone].

## 2021-01-12 NOTE — Care Management Important Message (Signed)
Important Message  Patient Details  Name: Antonio Oliver MRN: [id number] Date of Birth: 09/09/1955   Medicare Important Message Given:  Yes - Important Message mailed due to current National Emergency  Verbal consent obtained due to current National Emergency  Relationship to patient: Self Contact Name: Nikolaos Call Date: 01/12/21  Time: 1512 Phone: [phone] Outcome: Spoke with contact Important Message mailed to: Patient address on file    Delorse Lek 01/12/2021, 3:13 PM

## 2021-01-12 NOTE — ED Provider Notes (Addendum)
[facility] Provider Note   CSN: [id number] Arrival date & time: 01/05/21  1143     History Chief Complaint  Patient presents with  . Leg Pain    Antonio Oliver is a 65 y.o. male.  Patient complains of right leg pain and weakness.  Patient has had back surgery before and was supposed to get an MRI over at [facility].  But the pain has gotten worse and the weakness has gotten significantly worse and he cannot walk  The history is provided by the patient. No language interpreter was used.  Leg Pain Location:  Leg Leg location:  R leg Pain details:    Quality:  Aching   Radiates to:  R flank   Severity:  Moderate   Onset quality:  Sudden   Timing:  Constant   Progression:  Worsening Chronicity:  New Dislocation: no   Foreign body present:  No foreign bodies Associated symptoms: no back pain and no fatigue        Past Medical History:  Diagnosis Date  . Anxiety 10/30/2018  . Ascending aorta dilation ([place]) 03/06/2019  . Benign prostatic hyperplasia without lower urinary tract symptoms 11/17/2016   Last Assessment & Plan:  Stable PSA today.  Formatting of this note might be different from the original. Last Assessment & Plan:  Stable PSA today. Last Assessment & Plan:  Formatting of this note might be different from the original. Stable PSA today.  . Bicuspid aortic valve 12/22/2016  . BPH (benign prostatic hyperplasia)   . Bradycardia 02/01/2019  . Cervical myelopathy ([place]) 11/14/2018  . Charcot's joint of foot 03/05/2019  . Chronic back pain 04/03/2013  . Chronic bilateral low back pain 11/17/2016  . Chronic inflammatory arthritis 11/17/2016   History of positive rheumatoid factor in the past. Denies placement on immunosuppressive therapy  . Clinical depression 04/03/2013   Last Assessment & Plan:  Formatting of this note might be different from the original. Depression is unchanged.  Discussed sleep hygiene. Medication changes per  orders. Depression will be reassessed in 3 months Has been having drowsiness since taking clonazepam. Wishes to try Buspar daily. Discussed non-pharmacologic methods to reduce anxiety/depression such as working in [employer] and relaxation  . Compartment syndrome ([place]) 06/20/2013   Last Assessment & Plan:  Denies any problems with right leg since April/May Does have swelling at times if he is on this leg for long periods of time. Wears compression socks regularly and has helped with swelling.  . Complete heart block ([place]) 05/04/2017  . Depression   . Diabetes ([place]) 10/30/2018  . Diabetes mellitus due to underlying condition with unspecified complications ([place]) 0/78/6754  . Diabetes mellitus type 2 in nonobese (HCC)   . DM2 (diabetes mellitus, type 2) ([place]) 04/03/2013   Last Assessment & Plan:  Formatting of this note might be different from the original. Diabetes is unchanged.  Continue current treatment regimen. Regular aerobic exercise. Diabetes will be reassessed in 3 months. Will check A1c today. Denies any problems with feet or sensation.  . Enthesopathy of ankle and tarsus 04/04/2013   Last Assessment & Plan:  Continues with swelling after prolonged standing.  Still  Wearing support hose, which helps.  . Essential hypertension 10/30/2018  . Fatigue 10/30/2018  . FHx: migraine headaches 04/04/2013  . Fracture of capitate bone of wrist 02/26/2019  . Fracture of tibia 03/05/2019  . GERD (gastroesophageal reflux disease) 10/30/2018  . Glaucoma 04/03/2013  .  HA (headache) 04/03/2013   Last Assessment & Plan:  Formatting of this note might be different from the original. Infrequent, however still having them.  Not sure of the triggers.  Using zomig, which helps immediately.  Marland Kitchen Heart murmur 11/17/2016  . Hemangioma of skin and subcutaneous tissue 07/07/2017  . History of bariatric surgery 04/04/2013  . History of kidney stones 11/17/2016  . Hyperlipidemia 10/30/2018  . Hypertension   . Hypothyroidism  10/30/2018  . Kidney stone 02/01/2019   Last Assessment & Plan:  Renal condition is unchanged. Continue current medications. Renal condition will be reassessed in 3 months. Discussed increasing fluids, and possibility of pain if stone moves or causes obstruction. Told to call urologist or make appointment if problem worsens.  Last Assessment & Plan:  Formatting of this note might be different from the original. Renal condition is unchan  . Lentigo 07/07/2017  . Lesion of plantar nerve 04/04/2013  . Leukocytosis   . Migraine 11/17/2016  . Mixed dyslipidemia 10/30/2018  . Morbid obesity ([place]) 10/27/2011  . Multiple actinic keratoses 07/07/2017  . Multiple benign melanocytic nevi 07/07/2017  . Neck pain 04/04/2013  . Neoplasm of uncertain behavior of skin 07/28/2018  . Nondisplaced fracture of medial cuneiform of left foot, initial encounter for closed fracture 11/16/2018  . Nonrheumatic aortic valve stenosis 02/01/2019  . Osteoarthritis 10/30/2018  . Other seborrheic keratosis 07/07/2017  . Personal history of tobacco use, presenting hazards to health 04/04/2013  . Presence of permanent cardiac pacemaker   . S/P AVR (aortic valve replacement) 03/10/2017  . Severe single current episode of major depressive disorder, without psychotic features ([place]) 11/17/2016  . Status post bariatric surgery 04/04/2013  . Strain of rotator cuff 04/04/2013  . Surgery, elective   . Syncope 02/14/2019  . Vitamin D deficiency 10/30/2018  . Weakness     Patient Active Problem List   Diagnosis Date Noted  . Lumbar stenosis with neurogenic claudication 01/08/2021  . Back pain 01/05/2021  . BPH (benign prostatic hyperplasia)   . Depression   . Hypertension   . Presence of permanent cardiac pacemaker 12/28/2019  . Ascending aorta dilation ([place]) 03/06/2019  . Charcot's joint of foot 03/05/2019  . Fracture of tibia 03/05/2019  . Fracture of capitate bone of wrist 02/26/2019  . Syncope 02/14/2019  . Bradycardia 02/01/2019  .  Weakness   . Leukocytosis   . Nondisplaced fracture of medial cuneiform of left foot, initial encounter for closed fracture 11/16/2018  . Cervical myelopathy ([place]) 11/14/2018  . Hyperlipidemia 10/30/2018  . Vitamin D deficiency 10/30/2018  . Osteoarthritis 10/30/2018  . GERD (gastroesophageal reflux disease) 10/30/2018  . Fatigue 10/30/2018  . Hypothyroidism 10/30/2018  . Essential hypertension 10/30/2018  . Mixed dyslipidemia 10/30/2018  . Hemangioma of skin and subcutaneous tissue 07/07/2017  . Lentigo 07/07/2017  . Multiple actinic keratoses 07/07/2017  . Multiple benign melanocytic nevi 07/07/2017  . Complete heart block ([place]) 05/04/2017  . S/P AVR (aortic valve replacement) 03/10/2017  . Bicuspid aortic valve 12/22/2016  . Benign prostatic hyperplasia without lower urinary tract symptoms 11/17/2016  . Chronic inflammatory arthritis 11/17/2016  . Chronic bilateral low back pain 11/17/2016  . Heart murmur 11/17/2016  . History of kidney stones 11/17/2016  . Migraine 11/17/2016  . Anxiety 06/20/2013  . Enthesopathy of ankle and tarsus 04/04/2013  . Lesion of plantar nerve 04/04/2013  . Personal history of tobacco use, presenting hazards to health 04/04/2013  . Strain of rotator cuff 04/04/2013  . Neck pain 04/04/2013  .  Status post bariatric surgery 04/04/2013  . DM2 (diabetes mellitus, type 2) ([place]) 04/03/2013  . Chronic back pain 04/03/2013  . Glaucoma 04/03/2013  . Morbid obesity ([place]) 10/27/2011    Past Surgical History:  Procedure Laterality Date  . ANTERIOR CERVICAL DECOMP/DISCECTOMY FUSION N/A 11/17/2018   Procedure: CERVICAL THREE-CERVICAL FOUR, CERVICAL FOUR-CERVICAL FIVE, CERVICAL FIVE-CERVICAL SIX ANTERIOR CERVICAL DECOMPRESSION/DISCECTOMY FUSION;  Surgeon: Earnie Larsson, MD;  Location: [facility];  Service: Neurosurgery;  Laterality: N/A;  . APPLICATION OF WOUND VAC Left 04/05/2019   Procedure: Application Of Wound Vac;  Surgeon: Erle Crocker, MD;  Location: [facility];  Service: Orthopedics;  Laterality: Left;  . BACK SURGERY     x4  . FASCIOTOMY Left 04/05/2019   Left leg 2 compartment fasciotomy  . FASCIOTOMY Left 04/05/2019   Procedure: FASCIOTOMY LEFT LOWER LEG;  Surgeon: Erle Crocker, MD;  Location: [facility];  Service: Orthopedics;  Laterality: Left;  . FOOT NEUROMA SURGERY    . GASTRIC BYPASS    . HEMATOMA EVACUATION Left 04/05/2019   Procedure: Evacuation Hematoma Left Lower Leg;  Surgeon: Erle Crocker, MD;  Location: [facility];  Service: Orthopedics;  Laterality: Left;  . HEMORRHOID SURGERY     over 30 years ago  . HERNIA REPAIR     LIH umb  . LUMBAR LAMINECTOMY/DECOMPRESSION MICRODISCECTOMY N/A 01/08/2021   Procedure: CENTRAL LUMBAR LAMINECTOMY LUMBAR TWO-THREE, LUMBAR THREE-FOUR;  Surgeon: Jessy Oto, MD;  Location: [facility];  Service: Orthopedics;  Laterality: N/A;  . NASAL SINUS SURGERY     x2  . NECK SURGERY         Family History  Problem Relation Age of Onset  . Cancer Mother        breast  . Rheum arthritis Mother   . Diabetes Mother   . Heart disease Father   . Kidney disease Father   . Diabetes Father   . Hypertension Brother   . Diabetes Brother   . Osteoarthritis Brother   . Heart disease Brother   . Hypertension Brother   . Diabetes Brother   . Rheum arthritis Brother   . Depression Daughter   . Anxiety disorder Daughter     Social History   Tobacco Use  . Smoking status: Never Smoker  . Smokeless tobacco: Never Used  Vaping Use  . Vaping Use: Never used  Substance Use Topics  . Alcohol use: Yes    Alcohol/week: 1.0 standard drink    Types: 1 Glasses of wine per week    Comment: occ.  . Drug use: No    Home Medications Prior to Admission medications   Medication Sig Start Date End Date Taking? Authorizing Provider  aspirin EC 81 MG tablet Take 81 mg by mouth daily.   Yes [provider]  atorvastatin (LIPITOR) 20 MG tablet Take 20 mg by mouth daily at 6 PM.    Yes [provider]  brexpiprazole (REXULTI) 2 MG TABS tablet Take 2 mg by mouth daily.    Yes [provider]  buPROPion (WELLBUTRIN SR) 200 MG 12 hr tablet Take 200 mg by mouth 2 (two) times daily.   Yes [provider]  busPIRone (BUSPAR) 10 MG tablet Take 20 mg by mouth 2 (two) times daily.    Yes [provider]  calcium citrate (CALCITRATE - DOSED IN MG ELEMENTAL CALCIUM) 950 MG tablet Take 200 mg of elemental calcium by mouth 2 (two) times daily.   Yes [provider]  Cholecalciferol 125 MCG (5000 UT) TABS Take 125 mcg by mouth daily.   Yes [provider]  clonazePAM (KLONOPIN) 0.5 MG tablet Take 0.5 mg by mouth 2 (two) times daily as needed for anxiety.   Yes [provider]  DULoxetine (CYMBALTA) 60 MG capsule Take 60 mg by mouth 2 (two) times daily.    Yes [provider]  fexofenadine (ALLEGRA) 180 MG tablet Take 180 mg by mouth daily as needed for allergies.   Yes [provider]  finasteride (PROSCAR) 5 MG tablet Take 5 mg by mouth daily.   Yes [provider]  fluticasone (FLONASE) 50 MCG/ACT nasal spray Place 2 sprays into the nose daily as needed for allergies or rhinitis.   Yes [provider]  folic acid (FOLVITE) 1 MG tablet Take 1 mg by mouth daily.   Yes [provider]  furosemide (LASIX) 20 MG tablet Take 1 tablet (20 mg total) by mouth as needed (for leg swelling). Patient taking differently: Take 20 mg by mouth daily as needed for fluid. 07/12/19  Yes Revankar, Reita Cliche, MD  gabapentin (NEURONTIN) 600 MG tablet Take 600-1,200 mg by mouth 3 (three) times daily.   Yes [provider]  Galcanezumab-gnlm (EMGALITY) 120 MG/ML SOAJ Inject 120 mg into the skin every 28 (twenty-eight) days. 12/23/20  Yes Pieter Partridge, DO  levothyroxine (SYNTHROID) 125 MCG tablet Take 125 mcg by mouth daily.    Yes [provider]  lisinopril (ZESTRIL) 5 MG tablet Take 5 mg daily and as  needed for BP greater than 160. Patient taking differently: Take 5 mg by mouth daily. 12/05/20  Yes Revankar, Reita Cliche, MD  Magnesium 500 MG CAPS Take 500 mg by mouth 2 (two) times a day.    Yes [provider]  metFORMIN (GLUCOPHAGE) 1000 MG tablet Take 1,000 mg by mouth 2 (two) times daily with a meal.   Yes [provider]  metoprolol succinate (TOPROL-XL) 50 MG 24 hr tablet Take 25 mg by mouth at bedtime. Take with or immediately following a meal.   Yes [provider]  mirtazapine (REMERON) 30 MG tablet Take 30 mg by mouth at bedtime.    Yes [provider]  Multiple Vitamins-Minerals (MULTIVITAMIN WITH MINERALS) tablet Take 1 tablet by mouth daily.   Yes [provider]  mupirocin ointment (BACTROBAN) 2 % Apply 1 application topically 3 (three) times daily as needed (infection). 11/14/20  Yes [provider]  omeprazole (PRILOSEC) 20 MG capsule Take 20 mg by mouth 2 (two) times daily before a meal.    Yes [provider]  oxyCODONE-acetaminophen (PERCOCET) 10-325 MG tablet Take 1 tablet by mouth 3 (three) times daily.   Yes [provider]  potassium chloride SA (KLOR-CON) 20 MEQ tablet Take 20 mEq by mouth daily as needed (potassium defficiency). With lasix   Yes [provider]  Semaglutide (OZEMPIC, 0.25 OR 0.5 MG/DOSE, [place]) Inject 0.5 mg into the skin once a week.   Yes [provider]  solifenacin (VESICARE) 10 MG tablet Take 10 mg by mouth daily.   Yes [provider]  testosterone cypionate (DEPOTESTOSTERONE CYPIONATE) 200 MG/ML injection Inject 200 mg into the muscle every 30 (thirty) days.   Yes [provider]  tiZANidine (ZANAFLEX) 4 MG capsule Take 4-8 mg by mouth 3 (three) times daily as needed for muscle spasms.   Yes [provider]  traMADol (ULTRAM) 50 MG tablet Take 50 mg by mouth 3 (three)  times daily.   Yes [provider]  Ubrogepant 100 MG TABS Take 100  mg by mouth daily as needed for migraine.   Yes [provider]  COVID-19 mRNA vaccine, Pfizer, 30 MCG/0.3ML injection USE AS DIRECTED 06/24/20 06/24/21  Carlyle Basques, MD  diclofenac Sodium (VOLTAREN) 1 % GEL Apply 1 application topically 4 (four) times daily. 12/25/20   [provider]    Allergies    Morphine  Review of Systems   Review of Systems  Constitutional: Negative for appetite change and fatigue.  HENT: Negative for congestion, ear discharge and sinus pressure.   Eyes: Negative for discharge.  Respiratory: Negative for cough.   Cardiovascular: Negative for chest pain.  Gastrointestinal: Negative for abdominal pain and diarrhea.  Genitourinary: Negative for frequency and hematuria.  Musculoskeletal: Negative for back pain.  Skin: Negative for rash.  Neurological: Negative for seizures and headaches.       Right leg pain  Psychiatric/Behavioral: Negative for hallucinations.    Physical Exam Updated Vital Signs BP (!) 143/96   Pulse 70   Temp 97.9 F (36.6 C) (Oral)   Resp 16   Ht _0  (1.905 m)   Wt 90.3 kg   SpO2 97%   BMI 24.87 kg/m   Physical Exam Constitutional:      Appearance: He is well-developed.  HENT:     Head: Normocephalic.     Nose: Nose normal.  Eyes:     General: No scleral icterus.    Conjunctiva/sclera: Conjunctivae normal.  Neck:     Thyroid: No thyromegaly.  Cardiovascular:     Rate and Rhythm: Normal rate and regular rhythm.     Heart sounds: No murmur heard. No friction rub. No gallop.   Pulmonary:     Breath sounds: No stridor. No wheezing or rales.  Chest:     Chest wall: No tenderness.  Abdominal:     General: There is no distension.     Tenderness: There is no abdominal tenderness. There is no rebound.  Musculoskeletal:     Cervical back: Neck supple.     Comments: Weakness in right leg.  Patient cannot walk.  Patient also with tenderness lumbar spine  Lymphadenopathy:     Cervical: No cervical  adenopathy.  Skin:    Findings: No erythema or rash.  Neurological:     Mental Status: He is alert and oriented to person, place, and time.     Motor: No abnormal muscle tone.     Coordination: Coordination normal.  Psychiatric:        Behavior: Behavior normal.     ED Results / Procedures / Treatments   Labs (all labs ordered are listed, but only abnormal results are displayed) Labs Reviewed  MRSA PCR SCREENING - Abnormal; Notable for the following components:      Result Value   MRSA by PCR POSITIVE (*)    All other components within normal limits  BASIC METABOLIC PANEL - Abnormal; Notable for the following components:   Glucose, Bld 128 (*)    All other components within normal limits  HEMOGLOBIN A1C - Abnormal; Notable for the following components:   Hgb A1c MFr Bld 6.2 (*)    All other components within normal limits  GLUCOSE, CAPILLARY - Abnormal; Notable for the following components:   Glucose-Capillary 138 (*)    All other components within normal limits  COMPREHENSIVE METABOLIC PANEL - Abnormal; Notable for the following components:   Glucose, Bld 129 (*)  Total Protein 5.4 (*)    Albumin 3.1 (*)    ALT 49 (*)    All other components within normal limits  GLUCOSE, CAPILLARY - Abnormal; Notable for the following components:   Glucose-Capillary 112 (*)    All other components within normal limits  GLUCOSE, CAPILLARY - Abnormal; Notable for the following components:   Glucose-Capillary 116 (*)    All other components within normal limits  GLUCOSE, CAPILLARY - Abnormal; Notable for the following components:   Glucose-Capillary 124 (*)    All other components within normal limits  GLUCOSE, CAPILLARY - Abnormal; Notable for the following components:   Glucose-Capillary 128 (*)    All other components within normal limits  GLUCOSE, CAPILLARY - Abnormal; Notable for the following components:   Glucose-Capillary 169 (*)    All other components within normal limits   GLUCOSE, CAPILLARY - Abnormal; Notable for the following components:   Glucose-Capillary 166 (*)    All other components within normal limits  GLUCOSE, CAPILLARY - Abnormal; Notable for the following components:   Glucose-Capillary 165 (*)    All other components within normal limits  GLUCOSE, CAPILLARY - Abnormal; Notable for the following components:   Glucose-Capillary 177 (*)    All other components within normal limits  GLUCOSE, CAPILLARY - Abnormal; Notable for the following components:   Glucose-Capillary 138 (*)    All other components within normal limits  BASIC METABOLIC PANEL - Abnormal; Notable for the following components:   Glucose, Bld 136 (*)    All other components within normal limits  GLUCOSE, CAPILLARY - Abnormal; Notable for the following components:   Glucose-Capillary 146 (*)    All other components within normal limits  GLUCOSE, CAPILLARY - Abnormal; Notable for the following components:   Glucose-Capillary 128 (*)    All other components within normal limits  GLUCOSE, CAPILLARY - Abnormal; Notable for the following components:   Glucose-Capillary 113 (*)    All other components within normal limits  GLUCOSE, CAPILLARY - Abnormal; Notable for the following components:   Glucose-Capillary 133 (*)    All other components within normal limits  CBC - Abnormal; Notable for the following components:   WBC 11.5 (*)    All other components within normal limits  BASIC METABOLIC PANEL - Abnormal; Notable for the following components:   Glucose, Bld 162 (*)    All other components within normal limits  GLUCOSE, CAPILLARY - Abnormal; Notable for the following components:   Glucose-Capillary 179 (*)    All other components within normal limits  GLUCOSE, CAPILLARY - Abnormal; Notable for the following components:   Glucose-Capillary 162 (*)    All other components within normal limits  GLUCOSE, CAPILLARY - Abnormal; Notable for the following components:    Glucose-Capillary 187 (*)    All other components within normal limits  GLUCOSE, CAPILLARY - Abnormal; Notable for the following components:   Glucose-Capillary 154 (*)    All other components within normal limits  GLUCOSE, CAPILLARY - Abnormal; Notable for the following components:   Glucose-Capillary 111 (*)    All other components within normal limits  GLUCOSE, CAPILLARY - Abnormal; Notable for the following components:   Glucose-Capillary 115 (*)    All other components within normal limits  GLUCOSE, CAPILLARY - Abnormal; Notable for the following components:   Glucose-Capillary 144 (*)    All other components within normal limits  GLUCOSE, CAPILLARY - Abnormal; Notable for the following components:   Glucose-Capillary 137 (*)  All other components within normal limits  GLUCOSE, CAPILLARY - Abnormal; Notable for the following components:   Glucose-Capillary 120 (*)    All other components within normal limits  GLUCOSE, CAPILLARY - Abnormal; Notable for the following components:   Glucose-Capillary 121 (*)    All other components within normal limits  GLUCOSE, CAPILLARY - Abnormal; Notable for the following components:   Glucose-Capillary 154 (*)    All other components within normal limits  RESP PANEL BY RT-PCR (FLU A&B, COVID) ARPGX2  CBC WITH DIFFERENTIAL/PLATELET  HIV ANTIBODY (ROUTINE TESTING W REFLEX)  CBC  CBC WITH DIFFERENTIAL/PLATELET  PROTIME-INR  APTT  GLUCOSE, CAPILLARY  TYPE AND SCREEN    EKG EKG Interpretation  Date/Time:  Monday Jan 05 2021 14:00:49 EDT Ventricular Rate:  64 PR Interval:  195 QRS Duration: 94 QT Interval:  411 QTC Calculation: 424 R Axis:   -9 Text Interpretation: Sinus rhythm Anterior infarct, old No acute changes No significant change since last tracing Confirmed by Varney Biles [phone]) on 01/06/2021 5:02:52 PM   Radiology No results found.  Procedures Procedures   Medications Ordered in ED Medications  atorvastatin  (LIPITOR) tablet 20 mg (20 mg Oral Given 01/11/21 1604)  metoprolol succinate (TOPROL-XL) 24 hr tablet 25 mg (25 mg Oral Given 01/11/21 2129)  brexpiprazole (REXULTI) tablet 2 mg (2 mg Oral Given 01/11/21 0806)  buPROPion (WELLBUTRIN SR) 12 hr tablet 200 mg (200 mg Oral Given 01/12/21 0604)  busPIRone (BUSPAR) tablet 20 mg (20 mg Oral Given 01/11/21 2128)  DULoxetine (CYMBALTA) DR capsule 60 mg (60 mg Oral Given 01/11/21 2129)  mirtazapine (REMERON) tablet 30 mg (30 mg Oral Given 01/11/21 2129)  levothyroxine (SYNTHROID) tablet 125 mcg (125 mcg Oral Given 01/12/21 0604)  finasteride (PROSCAR) tablet 5 mg (5 mg Oral Given 01/11/21 0807)  darifenacin (ENABLEX) 24 hr tablet 7.5 mg (7.5 mg Oral Given 0/6/30 1601)  folic acid (FOLVITE) tablet 1 mg (1 mg Oral Given 01/11/21 0807)  clonazePAM (KLONOPIN) tablet 0.5 mg (0.5 mg Oral Given 01/10/21 2113)  gabapentin (NEURONTIN) capsule 600-1,200 mg (600 mg Oral Given 01/11/21 2129)  tiZANidine (ZANAFLEX) tablet 4-8 mg (4 mg Oral Given 01/11/21 0201)  magnesium oxide (MAG-OX) tablet 400 mg (400 mg Oral Given 01/12/21 0746)  multivitamin with minerals tablet 1 tablet (1 tablet Oral Given 01/11/21 0808)  insulin aspart (novoLOG) injection 0-15 Units (0 Units Subcutaneous Not Given 01/11/21 1746)  insulin aspart (novoLOG) injection 0-5 Units (0 Units Subcutaneous Not Given 01/11/21 2131)  ondansetron (ZOFRAN) tablet 4 mg ( Oral MAR Unhold 01/09/21 0004)    Or  ondansetron (ZOFRAN) injection 4 mg ( Intravenous MAR Unhold 01/09/21 0004)  SUMAtriptan (IMITREX) tablet 100 mg ( Oral MAR Unhold 01/09/21 0004)  oxyCODONE-acetaminophen (PERCOCET/ROXICET) 5-325 MG per tablet 1 tablet (1 tablet Oral Given 01/12/21 0746)    And  oxyCODONE (Oxy IR/ROXICODONE) immediate release tablet 5 mg (5 mg Oral Given 01/12/21 0746)  chlorhexidine (HIBICLENS) 4 % liquid (  Not Given 01/08/21 0706)  HYDROmorphone (DILAUDID) injection 1 mg (1 mg Intravenous Given 01/11/21 1604)  aspirin EC tablet 81 mg (81 mg Oral Given 01/11/21  0807)  0.9 %  sodium chloride infusion ( Intravenous New Bag/Given 01/09/21 0052)  sodium chloride flush (NS) 0.9 % injection 3 mL (3 mLs Intravenous Given 01/11/21 2131)  sodium chloride flush (NS) 0.9 % injection 3 mL (has no administration in time range)  0.9 %  sodium chloride infusion (0 mLs Intravenous Stopped 01/09/21 0054)  acetaminophen (TYLENOL) tablet  650 mg ( Oral Canceled Entry 01/09/21 0036)    Or  acetaminophen (TYLENOL) suppository 650 mg ( Rectal See Alternative 01/09/21 0036)  methocarbamol (ROBAXIN) tablet 500 mg (has no administration in time range)    Or  methocarbamol (ROBAXIN) 500 mg in dextrose 5 % 50 mL IVPB (has no administration in time range)  docusate sodium (COLACE) capsule 100 mg (100 mg Oral Given 01/11/21 2129)  bisacodyl (DULCOLAX) EC tablet 5 mg (has no administration in time range)  sodium phosphate (FLEET) 7-19 GM/118ML enema 1 enema (has no administration in time range)  ondansetron (ZOFRAN) tablet 4 mg (has no administration in time range)    Or  ondansetron (ZOFRAN) injection 4 mg (has no administration in time range)  alum & mag hydroxide-simeth (MAALOX/MYLANTA) 200-200-20 MG/5ML suspension 30 mL (has no administration in time range)  menthol-cetylpyridinium (CEPACOL) lozenge 3 mg (has no administration in time range)    Or  phenol (CHLORASEPTIC) mouth spray 1 spray (has no administration in time range)  fentaNYL (SUBLIMAZE) 100 MCG/2ML injection (has no administration in time range)  fentaNYL (SUBLIMAZE) 100 MCG/2ML injection (has no administration in time range)  oxyCODONE (Oxy IR/ROXICODONE) 5 MG immediate release tablet (has no administration in time range)  HYDROmorphone (DILAUDID) 1 MG/ML injection (has no administration in time range)  mupirocin ointment (BACTROBAN) 2 % ( Nasal Given 01/11/21 2130)  pantoprazole (PROTONIX) EC tablet 40 mg (40 mg Oral Given 01/11/21 2129)  polyethylene glycol (MIRALAX / GLYCOLAX) packet 17 g (17 g Oral Given 01/11/21 0808)   senna-docusate (Senokot-S) tablet 1 tablet (1 tablet Oral Given 01/11/21 2129)  zolpidem (AMBIEN) tablet 10 mg (10 mg Oral Given 01/11/21 2129)  LORazepam (ATIVAN) injection 1 mg (1 mg Intravenous Given 01/06/21 1119)  gadobutrol (GADAVIST) 1 MMOL/ML injection 8 mL (8 mLs Intravenous Contrast Given 01/06/21 1240)  chlorhexidine (HIBICLENS) 4 % liquid 4 application (4 application Topical Given 01/08/21 0643)  ceFAZolin (ANCEF) IVPB 2g/100 mL premix (2 g Intravenous Given 01/08/21 1940)  oxyCODONE (Oxy IR/ROXICODONE) immediate release tablet 5 mg (5 mg Oral Given 01/08/21 2239)    Or  oxyCODONE (ROXICODONE) 5 MG/5ML solution 5 mg ( Oral See Alternative 01/08/21 2239)  chlorhexidine gluconate (MEDLINE KIT) (PERIDEX) 0.12 % solution 15 mL (15 mLs Mouth/Throat Given 01/08/21 1755)  ceFAZolin (ANCEF) IVPB 2g/100 mL premix (2 g Intravenous New Bag/Given 01/09/21 1134)  acetaminophen (TYLENOL) tablet 1,000 mg (1,000 mg Oral Given 01/09/21 1811)    ED Course  I have reviewed the triage vital signs and the nursing notes.  Pertinent labs & imaging results that were available during my care of the patient were reviewed by me and considered in my medical decision making (see chart for details).    MDM Rules/Calculators/A&P                          Patient with back pain and weakness to his right leg.  He needs to get an MRI done on his back.  I spoke with orthopedics and the patient cannot have his MRI at [facility] he needs to go to [facility] and they cannot do his MRI until tomorrow morning.  So he will be admitted to medicine for back pain and weakness and get an MRI in the morning with Ortho consult Final Clinical Impression(s) / ED Diagnoses Final diagnoses:  Spinal stenosis of lumbar region with neurogenic claudication  Surgery, elective    Rx / DC Orders ED Discharge Orders  None       Milton Ferguson, MD 01/12/21 5430    Milton Ferguson, MD 01/16/21 1734

## 2021-01-12 NOTE — Telephone Encounter (Signed)
I called and advised that the MRI that was scheduled for tomorrow has been cancelled and that he has a f/u appt on 5/12 that needs to be r/s also.  That was moved to 01/22/21 @ 845 with james,

## 2021-01-12 NOTE — Plan of Care (Signed)

## 2021-01-12 NOTE — TOC Progression Note (Addendum)
Transition of Care [facility]) - Progression Note    Patient Details  Name: Antonio Oliver MRN: [id number] Date of Birth: 02/05/1956  Transition of Care [facility]) CM/SW Contact  Milinda Antis, LCSWA Phone Number: 01/12/2021, 2:25 PM  Clinical Narrative:    CSW met with patient and wife at bedside. The patient is not agreeable to go to a SNF and did not want CSW to fax his information to SNF..  The patient's wife reported that it would have to be the patient's decision and that she was 80% confident with caring for the patient at home and was agreeable to working with PT/OT to become more confident with this.   The patient is agreeable to home health.    15:40- [facility] agreed to provide HHPT/OT.  Patient's wife notified.    Expected Discharge Plan and Services                                                 Social Determinants of Health (SDOH) Interventions    Readmission Risk Interventions No flowsheet data found.

## 2021-01-12 NOTE — Telephone Encounter (Signed)
I called and advised that it can be cancelled due to him having it last week when he was in the ER. She states that she will cancel it. She states that they are going to get him out of the hospital tomorrow and get him home with [facility] since he is refusing SNF.

## 2021-01-12 NOTE — Plan of Care (Signed)

## 2021-01-12 NOTE — Progress Notes (Signed)
     Subjective: 4 Days Post-Op Procedure(s) (LRB): CENTRAL LUMBAR LAMINECTOMY LUMBAR TWO-THREE, LUMBAR THREE-FOUR (N/A)Awake, alert and oriented x 4. Using a [place] catheter. Slow with PT walking in room up through yesterday but up to hallway today walking about 80 feet. Complains that legs want to give away. He has moved bowels and is voiding with the [place] catheter.   Patient reports pain as moderate.    Objective:   VITALS:  Temp:  [97.9 F (36.6 C)-98.1 F (36.7 C)] 97.9 F (36.6 C) (05/09 0605) Pulse Rate:  [70-91] 86 (05/09 0944) Resp:  [16-18] 18 (05/09 0944) BP: (121-143)/(72-96) 128/89 (05/09 0944) SpO2:  [96 %-98 %] 96 % (05/09 0944)  Neurologically intact ABD soft Neurovascular intact Sensation intact distally Dorsiflexion/Plantar flexion intact Incision: dressing C/D/I and no drainage No cellulitis present Compartment soft   LABS No results for input(s): HGB, WBC, PLT in the last 72 hours. No results for input(s): NA, K, CL, CO2, BUN, CREATININE, GLUCOSE in the last 72 hours. No results for input(s): LABPT, INR in the last 72 hours.   Assessment/Plan: 4 Days Post-Op Procedure(s) (LRB): CENTRAL LUMBAR LAMINECTOMY LUMBAR TWO-THREE, LUMBAR THREE-FOUR (N/A)  Advance diet Up with therapy Discharge to SNF  [name] 01/12/2021, 11:25 AM Patient ID: Antonio Oliver, male   DOB: September 03, 1956, 65 y.o.   MRN: [id number]

## 2021-01-12 NOTE — Progress Notes (Signed)
       RE:  Antonio Oliver     Date of Birth:  Jan 15, 1956    Date:   01/12/2021      To Whom It May Concern:  Please be advised that the above-named patient will require a short-term nursing home stay - anticipated 30 days or less for rehabilitation and strengthening.  The plan is for return home.

## 2021-01-12 NOTE — Progress Notes (Signed)
Physical Therapy Treatment Patient Details Name: SUSAN ARANA MRN: [id number] DOB: 1955-11-01 Today's Date: 01/12/2021    History of Present Illness 65 y.o. male admitted under observation status on 01/05/21 for low back pain with R LE weakness, MRI completed and awaiting ortho consult.  Pt is now s/p central lumbar laminectomy L2-4 on 01/08/21. Pt with other significant PMH of AVR, bariatric surgery, L foot fx (cuneiform), HTN, glaucoma, fx of the L tibia s/p IM nail, complete heart block, charcot's deformity of the foot L, anxiety, neck surgery x 2, back surgery x 4.    PT Comments    Pt with improved ambulation tolerance this date however continues to requiring moderate assist for tranasfers and ADLs. Pt unsafe to return home at this time as pt is at increased falls risk with noted bilat knee buckling with onset of fatigue at 40'. Dr. Louanne Skye also talked about d/c with pt as CIR unable to take patient. Pt became emotional regarding going SNF as pt strongly desires to go home. Acute PT to cont to follow.  Follow Up Recommendations  SNF     Equipment Recommendations  3in1 (PT)    Recommendations for Other Services       Precautions / Restrictions Precautions Precautions: Fall;Back Precaution Booklet Issued: Yes (comment) Precaution Comments: Recalled 3/3 back prec Restrictions Weight Bearing Restrictions: No Other Position/Activity Restrictions: No bending, lifting, twisting, arching    Mobility  Bed Mobility               General bed mobility comments: spouse reported she helped pt get up OOB into chair, they reported they did do the log roll technique    Transfers Overall transfer level: Needs assistance Equipment used: Rolling walker (2 wheeled) Transfers: Sit to/from Stand Sit to Stand: Min assist;Mod assist         General transfer comment: modA from lower surface height to power up and steady during transition of hands, minA from built up surface height, pt with  knee buckling fall back into chair on first attempt to stand from lower surface height  Ambulation/Gait Ambulation/Gait assistance: Min assist;+2 safety/equipment (wife pushed the chair) Gait Distance (Feet): 40 Feet (x2) Assistive device: Rolling walker (2 wheeled) Gait Pattern/deviations: Step-to pattern;Step-through pattern;Decreased stride length;Decreased dorsiflexion - right;Decreased dorsiflexion - left;Narrow base of support;Trunk flexed Gait velocity: decreased Gait velocity interpretation: <1.8 ft/sec, indicate of risk for recurrent falls General Gait Details: pt initially with step to gait pattern transitioned to short step through with modA to keep pushing RW forward instead of stopping to step. verbal cues to stand up tall and look straight, pt began with L knee buckling around 40' causing pt to sit   Stairs             Wheelchair Mobility    Modified Rankin (Stroke Patients Only)       Balance Overall balance assessment: Needs assistance Sitting-balance support: Feet supported;No upper extremity supported Sitting balance-Leahy Scale: Good     Standing balance support: Bilateral upper extremity supported Standing balance-Leahy Scale: Poor Standing balance comment: dependent on RW                            Cognition Arousal/Alertness: Awake/alert Behavior During Therapy: WFL for tasks assessed/performed Overall Cognitive Status: Within Functional Limits for tasks assessed  General Comments: become emotional and began to cry when Dr. Louanne Skye told him he would have to go to SNF since CIR denied him and he wasn't safe to go home, pt frustrated with rehab process      Exercises General Exercises - Lower Extremity Gluteal Sets: AROM;Both;10 reps;Seated Long Arc Quad: AROM;Both;10 reps;Seated (with 5 sec hold at top) Hip Flexion/Marching: AROM;Both;10 reps;Seated Toe Raises: AROM;Both;10 reps;Seated (with  hold at the top) Heel Raises: AROM;Both;10 reps;Seated (with hold at the top)    General Comments General comments (skin integrity, edema, etc.): VSS WFL      Pertinent Vitals/Pain Pain Assessment: 0-10 Pain Score: 7  Pain Location: back and hips Pain Descriptors / Indicators: Discomfort;Grimacing Pain Intervention(s): Limited activity within patient's tolerance    Home Living Family/patient expects to be discharged to:: Unsure Living Arrangements: Spouse/significant other                  Prior Function            PT Goals (current goals can now be found in the care plan section) Acute Rehab PT Goals Patient Stated Goal: home Progress towards PT goals: Progressing toward goals    Frequency    Min 5X/week      PT Plan Current plan remains appropriate    Co-evaluation              AM-PAC PT "6 Clicks" Mobility   Outcome Measure  Help needed turning from your back to your side while in a flat bed without using bedrails?: A Little Help needed moving from lying on your back to sitting on the side of a flat bed without using bedrails?: A Little Help needed moving to and from a bed to a chair (including a wheelchair)?: A Little Help needed standing up from a chair using your arms (e.g., wheelchair or bedside chair)?: A Little Help needed to walk in hospital room?: A Little Help needed climbing 3-5 steps with a railing? : Total 6 Click Score: 16    End of Session Equipment Utilized During Treatment: Gait belt Activity Tolerance: Patient tolerated treatment well Patient left: in chair;with call bell/phone within reach;with family/visitor present;with chair alarm set Nurse Communication: Mobility status PT Visit Diagnosis: Muscle weakness (generalized) (M62.81);Difficulty in walking, not elsewhere classified (R26.2);Other symptoms and signs involving the nervous system (R29.898);Pain Pain - Right/Left: Right     Time: 8250-0370 PT Time Calculation  (min) (ACUTE ONLY): 26 min  Charges:  $Gait Training: 8-22 mins $Therapeutic Exercise: 8-22 mins                     Kittie Plater, PT, DPT Acute Rehabilitation Services Pager #: [phone] Office #: [phone]    Berline Lopes 01/12/2021, 11:57 AM

## 2021-01-12 NOTE — Telephone Encounter (Signed)
Dr. Arbutus Ped would like to know if MRI is needed that is scheduled for tomorrow?  CB# [phone].  Please advise.  Thank you.

## 2021-01-13 ENCOUNTER — Encounter (HOSPITAL_COMMUNITY): Payer: Self-pay

## 2021-01-13 ENCOUNTER — Inpatient Hospital Stay (HOSPITAL_COMMUNITY): Payer: Medicare PPO

## 2021-01-13 ENCOUNTER — Telehealth: Payer: Self-pay

## 2021-01-13 ENCOUNTER — Ambulatory Visit (HOSPITAL_COMMUNITY): Admission: RE | Admit: 2021-01-13 | Payer: Medicare PPO | Source: Ambulatory Visit

## 2021-01-13 DIAGNOSIS — M48062 Spinal stenosis, lumbar region with neurogenic claudication: Secondary | ICD-10-CM | POA: Diagnosis not present

## 2021-01-13 LAB — CUP PACEART REMOTE DEVICE CHECK
Battery Remaining Longevity: 136 mo
Battery Remaining Percentage: 95.5 %
Battery Voltage: 3.04 V
Brady Statistic AP VP Percent: 1 %
Brady Statistic AP VS Percent: 1 %
Brady Statistic AS VP Percent: 1 %
Brady Statistic AS VS Percent: 99 %
Brady Statistic RA Percent Paced: 1 %
Brady Statistic RV Percent Paced: 1 %
Date Time Interrogation Session: 20220510152550
Implantable Lead Implant Date: 20180710
Implantable Lead Implant Date: 20180710
Implantable Lead Location: 753859
Implantable Lead Location: 753860
Implantable Pulse Generator Implant Date: 20180710
Lead Channel Impedance Value: 480 Ohm
Lead Channel Impedance Value: 490 Ohm
Lead Channel Pacing Threshold Amplitude: 0.5 V
Lead Channel Pacing Threshold Amplitude: 0.875 V
Lead Channel Pacing Threshold Pulse Width: 0.4 ms
Lead Channel Pacing Threshold Pulse Width: 0.4 ms
Lead Channel Sensing Intrinsic Amplitude: 1.8 mV
Lead Channel Sensing Intrinsic Amplitude: 12 mV
Lead Channel Setting Pacing Amplitude: 1.125
Lead Channel Setting Pacing Amplitude: 2 V
Lead Channel Setting Pacing Pulse Width: 0.4 ms
Lead Channel Setting Sensing Sensitivity: 2 mV
Pulse Gen Model: 2272
Pulse Gen Serial Number: 8912213

## 2021-01-13 LAB — GLUCOSE, CAPILLARY
Glucose-Capillary: 102 mg/dL — ABNORMAL HIGH (ref 70–99)
Glucose-Capillary: 174 mg/dL — ABNORMAL HIGH (ref 70–99)

## 2021-01-13 MED ORDER — ZOLPIDEM TARTRATE 10 MG PO TABS: 10.0000 mg | ORAL_TABLET | Freq: Every day | ORAL | 0 refills | Status: DC

## 2021-01-13 MED ORDER — OXYCODONE HCL 5 MG PO TABS: 5.0000 mg | ORAL_TABLET | ORAL | 0 refills | Status: AC | PRN

## 2021-01-13 MED ORDER — ACETAMINOPHEN 500 MG PO TABS: 1000.0000 mg | ORAL_TABLET | Freq: Three times a day (TID) | ORAL | Status: DC

## 2021-01-13 MED ORDER — KETOROLAC TROMETHAMINE 30 MG/ML IJ SOLN
30.0000 mg | Freq: Once | INTRAMUSCULAR | Status: AC
  Administered 2021-01-13: 30 mg via INTRAVENOUS
  Filled 2021-01-13: qty 1

## 2021-01-13 MED ORDER — DOCUSATE SODIUM 100 MG PO CAPS: 100.0000 mg | ORAL_CAPSULE | Freq: Two times a day (BID) | ORAL | 0 refills | Status: DC

## 2021-01-13 MED ORDER — SENNOSIDES-DOCUSATE SODIUM 8.6-50 MG PO TABS: 1.0000 | ORAL_TABLET | Freq: Two times a day (BID) | ORAL | 0 refills | Status: AC

## 2021-01-13 MED ORDER — POLYETHYLENE GLYCOL 3350 17 G PO PACK: 17.0000 g | PACK | Freq: Every day | ORAL | 0 refills | Status: DC

## 2021-01-13 NOTE — Telephone Encounter (Signed)
Patients wife called she stated the patient seen Dr.Nitka in the er today and stated Dr.Nitka told her the patient needs to be seen next week with him instead of Jeneen Rinks the patient needs to be worked into the schedule call [phone]

## 2021-01-13 NOTE — Progress Notes (Signed)
Provided discharge education/instructions, all questions and concerns addressed, Pt not in distress, RW and BSC delivered to room, discharged home with belongings accompanied by family.

## 2021-01-13 NOTE — Discharge Summary (Signed)
Physician Discharge Summary  Antonio Oliver RSW:[id number] DOB: Jun 15, 1956 DOA: 01/05/2021  PCP: Antonio Nakai, MD  Admit date: 01/05/2021 Discharge date: 01/30/2021  Admitted From: home Disposition:  home  Recommendations for Outpatient Follow-up:  1. Follow up with PCP in 1-2 weeks 2. Please obtain BMP/CBC in one week 3. Please follow up with Dr. Louanne Oliver as he advises.  Call office to obtain or confirm appointment date and time.  Home Health: PT, OT Equipment/Devices: rolling walker, 3-n-1   Discharge Condition: stable  CODE STATUS: partial (do not intubate)  Diet recommendation: Heart Healthy      Discharge Diagnoses: Active Problems:   Back pain   Lumbar stenosis with neurogenic claudication    Summary of HPI and Hospital Course:  As per H&P written by Dr. Marylyn Ishihara 01/05/2021 "Antonio Oliver is a 65 y.o. male with medical history significant of DM2, HTN, HLD, neck surgery. Presenting with back pain and right lower extremity weakness. He reports that he woke up 5 days ago and could not walk. He went to an [facility] where he had a w/u that ruled out stroke. However, he was unable to get an MRI d/t a pacer. It was recommended that he follow up with a back specialist. He did regain some strength and ability to mobilize. So he followed up with his previous back specialist (Dr. Louanne Oliver) who ordered a MRI of his back to be completed on 5/10. This morning he woke up with worsening back pain and the inability to walk again. The pain was in his lower back and radiating down his right hip into his right leg. It was a burning pain that turned sharp with movement. He tried his percocet, but it didn't help. He spoke with his back team. It was recommended that he come to the ED. He denies any other aggravating or alleviating factors. "  Patient transferred to [facility] due to need for MRI with pacemaker present, and to be seen and evaluated by his back surgeon as well.  MRI Lumbar  Spine IMPRESSION: 1. Since previous MRI of 2008, the patient has undergone laminectomy and PLIF from L4 through S1. The spinal canal and neural foramina are well decompressed at the operative levels, and the interbody fusion appears solid. 2. Interval development of adjacent segment disease at L3-4 with advanced bilateral facet hypertrophy contributing to moderate to severe multifactorial spinal stenosis. 3. Progressive disc and endplate degeneration asymmetric to the right at L2-3 with mild spinal stenosis and mild right foraminal narrowing. 4. Moderate bladder distension without demonstrated cause.    Low back pain with right lower extremity radiculopathy Hx of prior back surgery, now with acute worsening several days ago.   MRI lumbar spine - see report - showed severe lumbar stenosis. 5/5 underwent central lumbar laminectomy at levels L2-3, L3-4 Post-op course pt had severe pain difficult to control --Ortho, Dr. Louanne Oliver constuled --Pain control per orders including gabapentin and Cymbalta, Zalaflex --Patient declined SNF for rehab as recommended by PT and OT.  Home health therapy was arranged.    Constipation - Most likely due to opioids. Bowel regimen while on pain medication.  Insomnia - in hospital setting, maybe related to pain as well.   Trial Ambien.   Improved.  PCP follow up if persistent.  Essential hypertension - chronic, intermittently high likely due to pain.  Continue metoprolol.  Hyperlipidemia - continue atorvastatin  Type 2 diabetes - covered with sliding scale Novolog  History of BPH -  Continue Proscar.  No reports of acute retention.   Anxiety/depression/Insomnia- stable.  Continue Wellbutrin, Rexulti, Klonopin PRN, Cymbalta, Remeron  Hx of migraines - continue Imitrex PRN  Hypothyroidism - continue levothyroxine    Discharge Instructions   Discharge Instructions    Call MD / Call 911   Complete by: As directed    If you experience chest pain  or shortness of breath, CALL 911 and be transported to the hospital emergency room.  If you develope a fever above 101 F, pus (white drainage) or increased drainage or redness at the wound, or calf pain, call your surgeon's office.   Call MD for:  extreme fatigue   Complete by: As directed    Call MD for:  persistant dizziness or light-headedness   Complete by: As directed    Call MD for:  persistant nausea and vomiting   Complete by: As directed    Call MD for:  redness, tenderness, or signs of infection (pain, swelling, redness, odor or green/yellow discharge around incision site)   Complete by: As directed    Call MD for:  severe uncontrolled pain   Complete by: As directed    Call MD for:  temperature >100.4   Complete by: As directed    Constipation Prevention   Complete by: As directed    Drink plenty of fluids.  Prune juice may be helpful.  You may use a stool softener, such as Colace (over the counter) 100 mg twice a day.  Use MiraLax (over the counter) for constipation as needed.   Diet - low sodium heart healthy   Complete by: As directed    Discharge instructions   Complete by: As directed    For pain control, recommend taking Tylenol 1000 mg three times/day. Oxycodone 5 mg if having moderate pain, 10 mg if having severe pain. Be sure to continue stool softeners while on pain medications.  Please follow up closely with Dr. Louanne Oliver.   Recommend calling his office today or tomorrow to get a follow up scheduled.   Discharge instructions   Complete by: As directed    No lifting greater than 10 lbs. Avoid bending, stooping and twisting. Walk in house for first week them may start to get out slowly increasing distance up to one mile by 3 weeks post op. Keep incision dry for 3 days, may use tegaderm or similar water impervious dressing.   Driving restrictions   Complete by: As directed    No driving for 6 weeks   Increase activity slowly   Complete by: As directed    Increase  activity slowly as tolerated   Complete by: As directed    Leave dressing on - Keep it clean, dry, and intact until clinic visit   Complete by: As directed    Lifting restrictions   Complete by: As directed    No lifting for 12 weeks   Post-operative opioid taper instructions:   Complete by: As directed    POST-OPERATIVE OPIOID TAPER INSTRUCTIONS: It is important to wean off of your opioid medication as soon as possible. If you do not need pain medication after your surgery it is ok to stop day one. Opioids include: Codeine, Hydrocodone(Norco, Vicodin), Oxycodone(Percocet, oxycontin) and hydromorphone amongst others.  Long term and even short term use of opiods can cause: Increased pain response Dependence Constipation Depression Respiratory depression And more.  Withdrawal symptoms can include Flu like symptoms Nausea, vomiting And more Techniques to manage these symptoms Hydrate well Eat regular healthy meals  Stay active Use relaxation techniques(deep breathing, meditating, yoga) Do Not substitute Alcohol to help with tapering If you have been on opioids for less than two weeks and do not have pain than it is ok to stop all together.  Plan to wean off of opioids This plan should start within one week post op of your joint replacement. Maintain the same interval or time between taking each dose and first decrease the dose.  Cut the total daily intake of opioids by one tablet each day Next start to increase the time between doses. The last dose that should be eliminated is the evening dose.        Allergies as of 01/13/2021      Reactions   Morphine Itching   Can take with benadry, facial and nose itching      Medication List    STOP taking these medications   Pfizer-BioNTech COVID-19 Vacc 30 MCG/0.3ML injection Generic drug: COVID-19 mRNA vaccine (Pfizer)     TAKE these medications   acetaminophen 500 MG tablet Commonly known as: TYLENOL Take 2 tablets (1,000  mg total) by mouth every 8 (eight) hours. Notes to patient: Last dose given 05/06 06:11pm   aspirin EC 81 MG tablet Take 81 mg by mouth daily.   atorvastatin 20 MG tablet Commonly known as: LIPITOR Take 20 mg by mouth daily at 6 PM.   brexpiprazole 2 MG Tabs tablet Commonly known as: REXULTI Take 2 mg by mouth daily.   buPROPion 200 MG 12 hr tablet Commonly known as: WELLBUTRIN SR Take 200 mg by mouth 2 (two) times daily.   busPIRone 10 MG tablet Commonly known as: BUSPAR Take 20 mg by mouth 2 (two) times daily.   calcium citrate 950 (200 Ca) MG tablet Commonly known as: CALCITRATE - dosed in mg elemental calcium Take 200 mg of elemental calcium by mouth 2 (two) times daily. Notes to patient: Resume home regimen   Cholecalciferol 125 MCG (5000 UT) Tabs Take 125 mcg by mouth daily. Notes to patient: Resume home regimen   clonazePAM 0.5 MG tablet Commonly known as: KLONOPIN Take 0.5 mg by mouth 2 (two) times daily as needed for anxiety. Notes to patient: Last dose given 05/09 11:40am   diclofenac Sodium 1 % Gel Commonly known as: VOLTAREN Apply 1 application topically 4 (four) times daily. Notes to patient: Resume home regimen   docusate sodium 100 MG capsule Commonly known as: COLACE Take 1 capsule (100 mg total) by mouth 2 (two) times daily.   DULoxetine 60 MG capsule Commonly known as: CYMBALTA Take 60 mg by mouth 2 (two) times daily.   Emgality 120 MG/ML Soaj Generic drug: Galcanezumab-gnlm Inject 120 mg into the skin every 28 (twenty-eight) days.   fexofenadine 180 MG tablet Commonly known as: ALLEGRA Take 180 mg by mouth daily as needed for allergies. Notes to patient: Resume home regimen   finasteride 5 MG tablet Commonly known as: PROSCAR Take 5 mg by mouth daily.   fluticasone 50 MCG/ACT nasal spray Commonly known as: FLONASE Place 2 sprays into the nose daily as needed for allergies or rhinitis. Notes to patient: Resume home regimen   folic  acid 1 MG tablet Commonly known as: FOLVITE Take 1 mg by mouth daily.   furosemide 20 MG tablet Commonly known as: LASIX Take 1 tablet (20 mg total) by mouth as needed (for leg swelling). What changed:   when to take this  reasons to take this Notes to patient: Resume home regimen  gabapentin 600 MG tablet Commonly known as: NEURONTIN Take 600-1,200 mg by mouth 3 (three) times daily.   levothyroxine 125 MCG tablet Commonly known as: SYNTHROID Take 125 mcg by mouth daily.   lisinopril 5 MG tablet Commonly known as: ZESTRIL Take 5 mg daily and as needed for BP greater than 160. What changed:   how much to take  how to take this  when to take this  additional instructions Notes to patient: Resume home regimen   Magnesium 500 MG Caps Take 500 mg by mouth 2 (two) times a day.   metFORMIN 1000 MG tablet Commonly known as: GLUCOPHAGE Take 1,000 mg by mouth 2 (two) times daily with a meal. Notes to patient: Resume home regimen   metoprolol succinate 50 MG 24 hr tablet Commonly known as: TOPROL-XL Take 25 mg by mouth at bedtime. Take with or immediately following a meal.   mirtazapine 30 MG tablet Commonly known as: REMERON Take 30 mg by mouth at bedtime.   multivitamin with minerals tablet Take 1 tablet by mouth daily.   mupirocin ointment 2 % Commonly known as: BACTROBAN Apply 1 application topically 3 (three) times daily as needed (infection). Notes to patient: Last dose given 05/10 10:10am   omeprazole 20 MG capsule Commonly known as: PRILOSEC Take 20 mg by mouth 2 (two) times daily before a meal. Notes to patient: Resume home regimen   oxyCODONE-acetaminophen 10-325 MG tablet Commonly known as: PERCOCET Take 1 tablet by mouth 3 (three) times daily.   OZEMPIC (0.25 OR 0.5 MG/DOSE) Custar Inject 0.5 mg into the skin once a week. Notes to patient: Resume home regimen   polyethylene glycol 17 g packet Commonly known as: MIRALAX / GLYCOLAX Take 17 g by  mouth daily.   potassium chloride SA 20 MEQ tablet Commonly known as: KLOR-CON Take 20 mEq by mouth daily as needed (potassium defficiency). With lasix Notes to patient: Resume home regimen   solifenacin 10 MG tablet Commonly known as: VESICARE Take 10 mg by mouth daily. Notes to patient: Resume home regimen   testosterone cypionate 200 MG/ML injection Commonly known as: DEPOTESTOSTERONE CYPIONATE Inject 200 mg into the muscle every 30 (thirty) days. Notes to patient: Resume home regimen   tiZANidine 4 MG capsule Commonly known as: ZANAFLEX Take 4-8 mg by mouth 3 (three) times daily as needed for muscle spasms. Notes to patient: Last dose given 05/08 02:01am   traMADol 50 MG tablet Commonly known as: ULTRAM Take 50 mg by mouth 3 (three) times daily. Notes to patient: Resume home regimen   Ubrogepant 100 MG Tabs Take 100 mg by mouth daily as needed for migraine. Notes to patient: Resume home regimen   zolpidem 10 MG tablet Commonly known as: AMBIEN Take 1 tablet (10 mg total) by mouth at bedtime for 14 days.     ASK your doctor about these medications   oxyCODONE 5 MG immediate release tablet Commonly known as: Oxy IR/ROXICODONE Take 1-2 tablets (5-10 mg total) by mouth every 4 (four) hours as needed for up to 7 days (1 tablet for moderate pain, 2 tablets for severe pain). Ask about: Should I take this medication?   senna-docusate 8.6-50 MG tablet Commonly known as: Senokot-S Take 1 tablet by mouth 2 (two) times daily for 14 days. Ask about: Should I take this medication?            Discharge Care Instructions  (From admission, onward)         Start  Ordered   01/13/21 0000  Leave dressing on - Keep it clean, dry, and intact until clinic visit        01/13/21 1012          Follow-up Information    Jessy Oto, MD In 2 weeks.   Specialty: Orthopedic Surgery Why: For wound re-check Contact information: [place]  [address] [phone]        [facility] [place] Follow up.   Specialty: Home Health Services Why: [facility] will provide HHOT/PT.  The agency will follow up within 48 to schedule home visit. Contact information: [address] [place] [address] [phone]        [facility] Follow up.   Why: [employer] will be providing the client with a rolling walker and a 3n1 Contact information: [address] [place] [address] [phone]              Allergies  Allergen Reactions  . Morphine Itching    Can take with benadry, facial and nose itching      If you experience worsening of your admission symptoms, develop shortness of breath, life threatening emergency, suicidal or homicidal thoughts you must seek medical attention immediately by calling 911 or calling your MD immediately  if symptoms less severe.    Please note   You were cared for by a hospitalist during your hospital stay. If you have any questions about your discharge medications or the care you received while you were in the hospital after you are discharged, you can call the unit and asked to speak with the hospitalist on call if the hospitalist that took care of you is not available. Once you are discharged, your primary care physician will handle any further medical issues. Please note that NO REFILLS for any discharge medications will be authorized once you are discharged, as it is imperative that you return to your primary care physician (or establish a relationship with a primary care physician if you do not have one) for your aftercare needs so that they can reassess your need for medications and monitor your lab values.   Consultations:  Ortho, Dr. Louanne Oliver    Procedures/Studies: DG Lumbar Spine 2-3 Views  Addendum Date: 01/08/2021   ADDENDUM REPORT: 01/08/2021 20:59 ADDENDUM: These results were called by telephone at the time of interpretation on 01/08/2021 at  8:55 pm to provider [name] , who verbally acknowledged these results. Electronically Signed   By: Fidela Salisbury MD   On: 01/08/2021 20:59   Result Date: 01/08/2021 CLINICAL DATA:  L3-4 laminectomy.  Intraoperative examination. EXAM: LUMBAR SPINE - 2-3 VIEW COMPARISON:  12:49 a.m. FINDINGS: Portable cross-table lateral radiograph of the lumbar spine is presented. L4-S1 lumbar fusion with instrumentation has been performed. Metallic probe is seen posterior to the L2-3 facet. Normal alignment. IMPRESSION: Metallic probe posterior to the L2-3 facet. Electronically Signed: By: Fidela Salisbury MD On: 01/08/2021 20:40   DG Lumbar Spine 2-3 Views  Result Date: 01/08/2021 CLINICAL DATA:  Neurogenic claudication EXAM: LUMBAR SPINE - 2-3 VIEW COMPARISON:  12/31/2020 FINDINGS: L4-S1 PLIF. No hardware abnormality. Mild height loss at L1 and L2 is unchanged. Normal alignment. IMPRESSION: Unchanged appearance of L4-S1 PLIF. Electronically Signed   By: Ulyses Jarred M.D.   On: 01/08/2021 02:05   MR Lumbar Spine W Wo Contrast  Result Date: 01/06/2021 CLINICAL DATA:  Progressive low back pain with right leg weakness. History of diabetes and  neck surgery. EXAM: MRI LUMBAR SPINE WITHOUT AND WITH CONTRAST TECHNIQUE: Multiplanar and multiecho pulse sequences of the lumbar spine were obtained without and with intravenous contrast. CONTRAST:  24mL GADAVIST GADOBUTROL 1 MMOL/ML IV SOLN COMPARISON:  Lumbar spine radiographs 12/31/2020. Lumbar MRI 04/28/2007. FINDINGS: Segmentation: Conventional anatomy assumed, with the last open disc space designated L5-S1.Concordant with previous imaging. Alignment:  Physiologic. Vertebrae: No worrisome osseous lesion, acute fracture or pars defect. Since the previous MRI, the patient has undergone posterior lumbar and interbody fusion from L4 through S1. The interbody fusion appears solid. There are progressive endplate degenerative changes at L2-3. The visualized sacroiliac joints appear  unremarkable. Conus medullaris: Extends to the T12-L1 level and appears normal. No abnormal intradural enhancement. Paraspinal and other soft tissues: No significant paraspinal findings. The urinary bladder is moderately distended. Disc levels: Sagittal images demonstrate no significant disc space findings at T11-12 or T12-L1. L1-2: Disc height and hydration are maintained. Mild bilateral facet hypertrophy contributing to mild left foraminal narrowing. L2-3: Progressive disc bulging with endplate degeneration asymmetric to the right. Mild facet and ligamentous hypertrophy. These factors contribute to mild spinal stenosis and mild right foraminal narrowing. L3-4: Adjacent segment disease with mild loss of disc height and annular disc bulging. There is advanced bilateral facet and ligamentous hypertrophy with bilateral facet joint effusions and resulting moderate to severe spinal stenosis. There is moderate narrowing of the lateral recesses and mild foraminal narrowing bilaterally. Interspinous process fluid is also present, consistent with Ba[place]rup disease. L4-5: Status post laminectomy and PLIF. The spinal canal and neural foramina are patent. L5-S1: Status post laminectomy and PLIF. The spinal canal and neural foramina are patent. IMPRESSION: 1. Since previous MRI of 2008, the patient has undergone laminectomy and PLIF from L4 through S1. The spinal canal and neural foramina are well decompressed at the operative levels, and the interbody fusion appears solid. 2. Interval development of adjacent segment disease at L3-4 with advanced bilateral facet hypertrophy contributing to moderate to severe multifactorial spinal stenosis. 3. Progressive disc and endplate degeneration asymmetric to the right at L2-3 with mild spinal stenosis and mild right foraminal narrowing. 4. Moderate bladder distension without demonstrated cause. Electronically Signed   By: Richardean Sale M.D.   On: 01/06/2021 12:59   CUP PACEART REMOTE  DEVICE CHECK  Result Date: 01/13/2021 Scheduled remote reviewed. Normal device function.  Next remote 91 days- JBox, RN/CVRS  XR Lumbar Spine 2-3 Views  Result Date: 01/21/2021 AP and lateral flexion and extension radiographs of the lumbar spine demonstrate central laminectomy L2-3 and L3-4, there is no anterolisthesis noted, moderate DDD at the L1-2 through L3-4 levels, no acute changes.     Central lumbar laminectomy on 01/07/21  Subjective: Pt feels well. Pain little better controlled. Tolerating PT a little better.  Confirms does not want to go to SNF, feels he'll improve at home once able to move around more on his own.    Discharge Exam: Vitals:   01/13/21 0510 01/13/21 0749  BP: 137/89 (!) 146/99  Pulse: 67 67  Resp: 18 17  Temp: 98.1 F (36.7 C) 98 F (36.7 C)  SpO2: 100% 100%   Vitals:   01/12/21 1544 01/12/21 2008 01/13/21 0510 01/13/21 0749  BP: 128/78 132/87 137/89 (!) 146/99  Pulse: 85 85 67 67  Resp: 18 18 18 17   Temp: 98 F (36.7 C) 98.1 F (36.7 C) 98.1 F (36.7 C) 98 F (36.7 C)  TempSrc:  Oral Oral Oral  SpO2: 96% 100% 100% 100%  Weight:      Height:        General: Pt is alert, awake, not in acute distress Cardiovascular: RRR, S1/S2 +, no rubs, no gallops Respiratory: CTA bilaterally, no wheezing, no rhonchi Abdominal: Soft, NT, ND, bowel sounds + Extremities: no edema, no cyanosis    The results of significant diagnostics from this hospitalization (including imaging, microbiology, ancillary and laboratory) are listed below for reference.     Microbiology: No results found for this or any previous visit (from the past 240 hour(s)).   Labs: BNP (last 3 results) No results for input(s): BNP in the last 8760 hours. Basic Metabolic Panel: No results for input(s): NA, K, CL, CO2, GLUCOSE, BUN, CREATININE, CALCIUM, MG, PHOS in the last 168 hours. Liver Function Tests: No results for input(s): AST, ALT, ALKPHOS, BILITOT, PROT, ALBUMIN in the  last 168 hours. No results for input(s): LIPASE, AMYLASE in the last 168 hours. No results for input(s): AMMONIA in the last 168 hours. CBC: No results for input(s): WBC, NEUTROABS, HGB, HCT, MCV, PLT in the last 168 hours. Cardiac Enzymes: No results for input(s): CKTOTAL, CKMB, CKMBINDEX, TROPONINI in the last 168 hours. BNP: Invalid input(s): POCBNP CBG: No results for input(s): GLUCAP in the last 168 hours. D-Dimer No results for input(s): DDIMER in the last 72 hours. Hgb A1c No results for input(s): HGBA1C in the last 72 hours. Lipid Profile No results for input(s): CHOL, HDL, LDLCALC, TRIG, CHOLHDL, LDLDIRECT in the last 72 hours. Thyroid function studies No results for input(s): TSH, T4TOTAL, T3FREE, THYROIDAB in the last 72 hours.  Invalid input(s): FREET3 Anemia work up No results for input(s): VITAMINB12, FOLATE, FERRITIN, TIBC, IRON, RETICCTPCT in the last 72 hours. Urinalysis    Component Value Date/Time   COLORURINE YELLOW 02/14/2019 2024   APPEARANCEUR CLEAR 02/14/2019 2024   LABSPEC 1.019 02/14/2019 2024   PHURINE 5.0 02/14/2019 2024   GLUCOSEU 50 (A) 02/14/2019 2024   HGBUR NEGATIVE 02/14/2019 2024   BILIRUBINUR NEGATIVE 02/14/2019 2024   KETONESUR NEGATIVE 02/14/2019 2024   PROTEINUR NEGATIVE 02/14/2019 2024   UROBILINOGEN 0.2 08/23/2007 1324   NITRITE NEGATIVE 02/14/2019 2024   LEUKOCYTESUR NEGATIVE 02/14/2019 2024   Sepsis Labs Invalid input(s): PROCALCITONIN,  WBC,  LACTICIDVEN Microbiology No results found for this or any previous visit (from the past 240 hour(s)).   Time coordinating discharge: Over 30 minutes  SIGNED:   Ezekiel Slocumb, DO [facility] 01/30/2021, 5:01 PM   If 7PM-7AM, please contact night-coverage [email or web address]

## 2021-01-13 NOTE — Progress Notes (Signed)
Occupational Therapy Treatment Patient Details Name: Antonio Oliver MRN: [id number] DOB: 1955/11/06 Today's Date: 01/13/2021    History of present illness 65 y.o. male admitted under observation status on 01/05/21 for low back pain with R LE weakness, MRI completed and awaiting ortho consult.  Pt is now s/p central lumbar laminectomy L2-4 on 01/08/21. Pt with other significant PMH of AVR, bariatric surgery, L foot fx (cuneiform), HTN, glaucoma, fx of the L tibia s/p IM nail, complete heart block, charcot's deformity of the foot L, anxiety, neck surgery x 2, back surgery x 4.   OT comments  Antonio Oliver is continuing to progress towards his goals with plans to dc home today. This session was focused on increased indep with lower body ADLs via AE and compensatory techniques. Pt and wife demonstrated great ability to use AE for lower body ADLs, wife plans to purchase AE to be delivered home. Pt continues to benefit from skilled acute OT services if his hospital stay extends beyond today. Recommend d/c to home with [facility] and 24/7 supervision.    Follow Up Recommendations  Home health OT;Supervision/Assistance - 24 hour    Equipment Recommendations  3 in 1 bedside commode;Other (comment)       Precautions / Restrictions Precautions Precautions: Fall;Back Precaution Booklet Issued: Yes (comment) Precaution Comments: Recalled 3/3 back prec Restrictions Weight Bearing Restrictions: No       Mobility Bed Mobility Overal bed mobility: Needs Assistance             General bed mobility comments: pt sitting in chair upon arrival    Transfers           Balance Overall balance assessment: Needs assistance Sitting-balance support: Feet supported;No upper extremity supported Sitting balance-Leahy Scale: Good     Standing balance support: No upper extremity supported;During functional activity Standing balance-Leahy Scale: Poor             ADL either performed or assessed with clinical  judgement   ADL Overall ADL's : Needs assistance/impaired Eating/Feeding: Independent;Sitting                   Lower Body Dressing: Minimal assistance;Sit to/from stand;With adaptive equipment Lower Body Dressing Details (indicate cue type and reason): Educated pt on AE this session, demonstrated great understanding             Functional mobility during ADLs: Minimal assistance;Rolling walker;Cueing for safety General ADL Comments: Session focused on lower body ADLs with AE and maintaing back precautions; wife plans to [place]               Cognition Arousal/Alertness: Awake/alert Behavior During Therapy: [facility] for tasks assessed/performed          General Comments: Pt in great spirits, excited to go home              General Comments no apparent skin integrity issues, pt reported "wetting" the bed becuase his catheter came off    Pertinent Vitals/ Pain       Pain Assessment: No/denies pain Pain Score: 0-No pain Pain Intervention(s): Limited activity within patient's tolerance;Monitored during session         Frequency  Min 2X/week        Progress Toward Goals  OT Goals(current goals can now be found in the care plan section)  Progress towards OT goals: Progressing toward goals  Acute Rehab OT Goals Patient Stated Goal: home OT Goal Formulation: With patient Time For Goal Achievement: 01/23/21  Potential to Achieve Goals: Fair ADL Goals Pt Will Perform Lower Body Bathing: with min guard assist;sit to/from stand Pt Will Perform Lower Body Dressing: with min guard assist;with adaptive equipment;sit to/from stand Pt Will Transfer to Toilet: with min guard assist;stand pivot transfer;bedside commode Pt Will Perform Toileting - Clothing Manipulation and hygiene: with min guard assist;sit to/from stand Additional ADL Goal #1: Pt will recall at least 3 fall prevention stratgies to prepare for a safe transition into the home environment   Plan Discharge plan remains appropriate       AM-PAC OT "6 Clicks" Daily Activity     Outcome Measure   Help from another person eating meals?: None Help from another person taking care of personal grooming?: A Little Help from another person toileting, which includes using toliet, bedpan, or urinal?: A Little Help from another person bathing (including washing, rinsing, drying)?: A Lot Help from another person to put on and taking off regular upper body clothing?: A Little Help from another person to put on and taking off regular lower body clothing?: A Little 6 Click Score: 18    End of Session Equipment Utilized During Treatment: Gait belt;Rolling walker  OT Visit Diagnosis: Unsteadiness on feet (R26.81);Other abnormalities of gait and mobility (R26.89);Repeated falls (R29.6);Muscle weakness (generalized) (M62.81);History of falling (Z91.81);Pain Pain - Right/Left:  (back) Pain - part of body:  (back)   Activity Tolerance     Patient Left     Nurse Communication Mobility status        Time: 1219-7588 OT Time Calculation (min): 12 min  Charges: OT General Charges $OT Visit: 1 Visit OT Treatments $Self Care/Home Management : 8-22 mins     Sheza Strickland A Cleta Heatley 01/13/2021, 1:11 PM

## 2021-01-13 NOTE — Progress Notes (Signed)
     Subjective: 5 Days Post-Op Procedure(s) (LRB): CENTRAL LUMBAR LAMINECTOMY LUMBAR TWO-THREE, LUMBAR THREE-FOUR (N/A) Awake, alert and oriented x 4. Progressed well with PT yesterday and today and he is not willing to go to SNF. Will discharge home with home health nursing for PT and OT.   Patient reports pain as moderate.    Objective:   VITALS:  Temp:  [98 F (36.7 C)-98.1 F (36.7 C)] 98 F (36.7 C) (05/10 0749) Pulse Rate:  [67-86] 67 (05/10 0749) Resp:  [17-19] 17 (05/10 0749) BP: (128-146)/(73-99) 146/99 (05/10 0749) SpO2:  [96 %-100 %] 100 % (05/10 0749)  Neurologically intact ABD soft Neurovascular intact Sensation intact distally Intact pulses distally Dorsiflexion/Plantar flexion intact Incision: dressing C/D/I and no drainage   LABS No results for input(s): HGB, WBC, PLT in the last 72 hours. No results for input(s): NA, K, CL, CO2, BUN, CREATININE, GLUCOSE in the last 72 hours. No results for input(s): LABPT, INR in the last 72 hours.   Assessment/Plan: 5 Days Post-Op Procedure(s) (LRB): CENTRAL LUMBAR LAMINECTOMY LUMBAR TWO-THREE, LUMBAR THREE-FOUR (N/A)  Advance diet Up with therapy Discharge home with home health  Antonio Oliver 01/13/2021, 12:12 PM Patient ID: Antonio Oliver, male   DOB: 1956/06/23, 66 y.o.   MRN: [id number]

## 2021-01-13 NOTE — Plan of Care (Signed)

## 2021-01-13 NOTE — Progress Notes (Signed)
Physical Therapy Treatment Patient Details Name: Antonio Oliver MRN: [id number] DOB: 04/15/1956 Today's Date: 01/13/2021    History of Present Illness 65 y.o. male admitted under observation status on 01/05/21 for low back pain with R LE weakness, MRI completed and awaiting ortho consult.  Pt is now s/p central lumbar laminectomy L2-4 on 01/08/21. Pt with other significant PMH of AVR, bariatric surgery, L foot fx (cuneiform), HTN, glaucoma, fx of the L tibia s/p IM nail, complete heart block, charcot's deformity of the foot L, anxiety, neck surgery x 2, back surgery x 4.    PT Comments    Pt reports he does not have stair for d/c so focused on gt and back precautions.  Pt tolerated increased distance well with no signs of buckling.    Follow Up Recommendations  SNF     Equipment Recommendations  3in1 (PT)    Recommendations for Other Services       Precautions / Restrictions Precautions Precautions: Fall;Back Precaution Booklet Issued: Yes (comment) Precaution Comments: Recalled 3/3 back prec Restrictions Weight Bearing Restrictions: No Other Position/Activity Restrictions: No bending, lifting, twisting, arching    Mobility  Bed Mobility Overal bed mobility: Needs Assistance             General bed mobility comments: pt sitting in chair upon arrival    Transfers Overall transfer level: Needs assistance Equipment used: Rolling walker (2 wheeled) Transfers: Sit to/from Stand Sit to Stand: Supervision         General transfer comment: No assistance needed.  Ambulation/Gait Ambulation/Gait assistance: Min guard Gait Distance (Feet): 80 Feet Assistive device: Rolling walker (2 wheeled) Gait Pattern/deviations: Step-to pattern;Decreased stride length     General Gait Details: No buckling noted and required decreased assistance.   Stairs             Wheelchair Mobility    Modified Rankin (Stroke Patients Only)       Balance Overall balance  assessment: Needs assistance Sitting-balance support: Feet supported;No upper extremity supported Sitting balance-Leahy Scale: Good     Standing balance support: No upper extremity supported;During functional activity Standing balance-Leahy Scale: Fair                              Cognition Arousal/Alertness: Awake/alert Behavior During Therapy: WFL for tasks assessed/performed Overall Cognitive Status: Within Functional Limits for tasks assessed                                 General Comments: Pt in great spirits, excited to go home      Exercises      General Comments General comments (skin integrity, edema, etc.): no apparent skin integrity issues, pt reported "wetting" the bed becuase his catheter came off      Pertinent Vitals/Pain Pain Assessment: 0-10 Pain Score: 3  Pain Location: back and hips Pain Descriptors / Indicators: Discomfort;Grimacing Pain Intervention(s): Monitored during session    Home Living                      Prior Function            PT Goals (current goals can now be found in the care plan section) Acute Rehab PT Goals Patient Stated Goal: home Potential to Achieve Goals: Good Progress towards PT goals: Progressing toward goals    Frequency    Min  5X/week      PT Plan Current plan remains appropriate    Co-evaluation              AM-PAC PT "6 Clicks" Mobility   Outcome Measure  Help needed turning from your back to your side while in a flat bed without using bedrails?: A Little Help needed moving from lying on your back to sitting on the side of a flat bed without using bedrails?: A Little Help needed moving to and from a bed to a chair (including a wheelchair)?: A Little Help needed standing up from a chair using your arms (e.g., wheelchair or bedside chair)?: A Little Help needed to walk in hospital room?: A Little Help needed climbing 3-5 steps with a railing? : A Little 6 Click  Score: 18    End of Session Equipment Utilized During Treatment: Gait belt Activity Tolerance: Patient tolerated treatment well Patient left: in chair;with call bell/phone within reach;with family/visitor present;with chair alarm set Nurse Communication: Mobility status PT Visit Diagnosis: Muscle weakness (generalized) (M62.81);Difficulty in walking, not elsewhere classified (R26.2);Other symptoms and signs involving the nervous system (R29.898);Pain Pain - Right/Left: Right     Time: 1610-9604 PT Time Calculation (min) (ACUTE ONLY): 10 min  Charges:  $Gait Training: 8-22 mins                     Erasmo Leventhal , PTA Acute Rehabilitation Services Pager [phone] Office [place] Antonio Oliver 01/13/2021, 4:40 PM

## 2021-01-13 NOTE — TOC Transition Note (Signed)
Transition of Care [facility]) - CM/SW Discharge Note   Patient Details  Name: Antonio Oliver MRN: [id number] Date of Birth: 03-04-1956  Transition of Care [facility] CM/SW Contact:  Joanne Chars, LCSW Phone Number: 01/13/2021, 11:27 AM   Clinical Narrative:   Pt discharging home with [facility].  3n1, rolling walker provided by [employer].  Pt wife in room and planning to transport pt home.  They report they have a virtual appt with PCP scheduled for tomorrow.  No other needs identified.     Final next level of care: [place] Barriers to Discharge: Barriers Resolved   Patient Goals and CMS Choice        Discharge Placement                       Discharge Plan and Services                DME Arranged: 3-N-1,Walker rolling DME Agency: [employer]     Representative spoke with at DME Agency: Equipment in room--not sure when it was ordered HH Arranged: PT,OT [place] Agency: [facility] [employer] ([place]) Date [place]: 01/12/21 Time [place]: 46 Representative spoke with at [place] who was spoken to--this was set up yesterday.  Social Determinants of Health (SDOH) Interventions     Readmission Risk Interventions No flowsheet data found.

## 2021-01-14 DIAGNOSIS — E785 Hyperlipidemia, unspecified: Secondary | ICD-10-CM | POA: Diagnosis not present

## 2021-01-14 DIAGNOSIS — N3281 Overactive bladder: Secondary | ICD-10-CM | POA: Diagnosis not present

## 2021-01-14 DIAGNOSIS — R000 Tachycardia, unspecified: Secondary | ICD-10-CM | POA: Diagnosis not present

## 2021-01-14 DIAGNOSIS — Z794 Long term (current) use of insulin: Secondary | ICD-10-CM | POA: Diagnosis not present

## 2021-01-14 DIAGNOSIS — E291 Testicular hypofunction: Secondary | ICD-10-CM | POA: Diagnosis not present

## 2021-01-14 DIAGNOSIS — N400 Enlarged prostate without lower urinary tract symptoms: Secondary | ICD-10-CM | POA: Diagnosis not present

## 2021-01-14 DIAGNOSIS — E1121 Type 2 diabetes mellitus with diabetic nephropathy: Secondary | ICD-10-CM | POA: Diagnosis not present

## 2021-01-14 DIAGNOSIS — M159 Polyosteoarthritis, unspecified: Secondary | ICD-10-CM | POA: Diagnosis not present

## 2021-01-14 DIAGNOSIS — I10 Essential (primary) hypertension: Secondary | ICD-10-CM | POA: Diagnosis not present

## 2021-01-14 NOTE — Telephone Encounter (Signed)
Moved appt from 01/22/21 to 01/21/21 with Dr. Louanne Skye

## 2021-01-15 ENCOUNTER — Telehealth: Payer: Self-pay | Admitting: Specialist

## 2021-01-15 ENCOUNTER — Ambulatory Visit: Payer: Medicare PPO | Admitting: Specialist

## 2021-01-15 DIAGNOSIS — K219 Gastro-esophageal reflux disease without esophagitis: Secondary | ICD-10-CM | POA: Diagnosis not present

## 2021-01-15 DIAGNOSIS — E11610 Type 2 diabetes mellitus with diabetic neuropathic arthropathy: Secondary | ICD-10-CM | POA: Diagnosis not present

## 2021-01-15 DIAGNOSIS — M1990 Unspecified osteoarthritis, unspecified site: Secondary | ICD-10-CM | POA: Diagnosis not present

## 2021-01-15 DIAGNOSIS — I442 Atrioventricular block, complete: Secondary | ICD-10-CM | POA: Diagnosis not present

## 2021-01-15 DIAGNOSIS — Z4789 Encounter for other orthopedic aftercare: Secondary | ICD-10-CM | POA: Diagnosis not present

## 2021-01-15 DIAGNOSIS — I10 Essential (primary) hypertension: Secondary | ICD-10-CM | POA: Diagnosis not present

## 2021-01-15 DIAGNOSIS — F419 Anxiety disorder, unspecified: Secondary | ICD-10-CM | POA: Diagnosis not present

## 2021-01-15 DIAGNOSIS — F32A Depression, unspecified: Secondary | ICD-10-CM | POA: Diagnosis not present

## 2021-01-15 DIAGNOSIS — M48062 Spinal stenosis, lumbar region with neurogenic claudication: Secondary | ICD-10-CM | POA: Diagnosis not present

## 2021-01-15 NOTE — Telephone Encounter (Signed)
Lmom for Anda Kraft to give verbal orders for PT

## 2021-01-15 NOTE — Telephone Encounter (Signed)
Received call from Gennaro Africa (PT) with [facility] needing verbal orders for HHPT 2 WK 4. The number to contact Anda Kraft is [phone]

## 2021-01-16 ENCOUNTER — Telehealth: Payer: Self-pay | Admitting: Specialist

## 2021-01-16 DIAGNOSIS — M48062 Spinal stenosis, lumbar region with neurogenic claudication: Secondary | ICD-10-CM | POA: Diagnosis not present

## 2021-01-16 DIAGNOSIS — Z4789 Encounter for other orthopedic aftercare: Secondary | ICD-10-CM | POA: Diagnosis not present

## 2021-01-16 DIAGNOSIS — K219 Gastro-esophageal reflux disease without esophagitis: Secondary | ICD-10-CM | POA: Diagnosis not present

## 2021-01-16 DIAGNOSIS — I10 Essential (primary) hypertension: Secondary | ICD-10-CM | POA: Diagnosis not present

## 2021-01-16 DIAGNOSIS — I442 Atrioventricular block, complete: Secondary | ICD-10-CM | POA: Diagnosis not present

## 2021-01-16 DIAGNOSIS — M1990 Unspecified osteoarthritis, unspecified site: Secondary | ICD-10-CM | POA: Diagnosis not present

## 2021-01-16 DIAGNOSIS — F32A Depression, unspecified: Secondary | ICD-10-CM | POA: Diagnosis not present

## 2021-01-16 DIAGNOSIS — E11610 Type 2 diabetes mellitus with diabetic neuropathic arthropathy: Secondary | ICD-10-CM | POA: Diagnosis not present

## 2021-01-16 DIAGNOSIS — F419 Anxiety disorder, unspecified: Secondary | ICD-10-CM | POA: Diagnosis not present

## 2021-01-16 NOTE — Telephone Encounter (Signed)
See below

## 2021-01-16 NOTE — Telephone Encounter (Signed)
Antonio Oliver with [facility] called wanting to make Dr. Louanne Skye aware the pt fell now and is now complaining of increased pain. Pt states his pain is a 8 out of 10.   Antonio Oliver CB# [phone]

## 2021-01-19 DIAGNOSIS — Z794 Long term (current) use of insulin: Secondary | ICD-10-CM | POA: Diagnosis not present

## 2021-01-19 DIAGNOSIS — K219 Gastro-esophageal reflux disease without esophagitis: Secondary | ICD-10-CM | POA: Diagnosis not present

## 2021-01-19 DIAGNOSIS — N390 Urinary tract infection, site not specified: Secondary | ICD-10-CM | POA: Diagnosis not present

## 2021-01-19 DIAGNOSIS — M1990 Unspecified osteoarthritis, unspecified site: Secondary | ICD-10-CM | POA: Diagnosis not present

## 2021-01-19 DIAGNOSIS — I442 Atrioventricular block, complete: Secondary | ICD-10-CM | POA: Diagnosis not present

## 2021-01-19 DIAGNOSIS — M48062 Spinal stenosis, lumbar region with neurogenic claudication: Secondary | ICD-10-CM | POA: Diagnosis not present

## 2021-01-19 DIAGNOSIS — F419 Anxiety disorder, unspecified: Secondary | ICD-10-CM | POA: Diagnosis not present

## 2021-01-19 DIAGNOSIS — Z6826 Body mass index (BMI) 26.0-26.9, adult: Secondary | ICD-10-CM | POA: Diagnosis not present

## 2021-01-19 DIAGNOSIS — Z4789 Encounter for other orthopedic aftercare: Secondary | ICD-10-CM | POA: Diagnosis not present

## 2021-01-19 DIAGNOSIS — I10 Essential (primary) hypertension: Secondary | ICD-10-CM | POA: Diagnosis not present

## 2021-01-19 DIAGNOSIS — E11610 Type 2 diabetes mellitus with diabetic neuropathic arthropathy: Secondary | ICD-10-CM | POA: Diagnosis not present

## 2021-01-19 DIAGNOSIS — E1121 Type 2 diabetes mellitus with diabetic nephropathy: Secondary | ICD-10-CM | POA: Diagnosis not present

## 2021-01-19 DIAGNOSIS — F32A Depression, unspecified: Secondary | ICD-10-CM | POA: Diagnosis not present

## 2021-01-21 ENCOUNTER — Ambulatory Visit (INDEPENDENT_AMBULATORY_CARE_PROVIDER_SITE_OTHER): Payer: Medicare PPO

## 2021-01-21 ENCOUNTER — Ambulatory Visit (INDEPENDENT_AMBULATORY_CARE_PROVIDER_SITE_OTHER): Payer: Medicare PPO | Admitting: Specialist

## 2021-01-21 ENCOUNTER — Encounter: Payer: Self-pay | Admitting: Specialist

## 2021-01-21 ENCOUNTER — Other Ambulatory Visit: Payer: Self-pay

## 2021-01-21 VITALS — BP 150/94 | HR 65 | Ht 75.0 in | Wt 199.0 lb

## 2021-01-21 DIAGNOSIS — M48062 Spinal stenosis, lumbar region with neurogenic claudication: Secondary | ICD-10-CM | POA: Diagnosis not present

## 2021-01-21 DIAGNOSIS — M5416 Radiculopathy, lumbar region: Secondary | ICD-10-CM

## 2021-01-21 DIAGNOSIS — Z9889 Other specified postprocedural states: Secondary | ICD-10-CM | POA: Diagnosis not present

## 2021-01-21 DIAGNOSIS — I442 Atrioventricular block, complete: Secondary | ICD-10-CM | POA: Diagnosis not present

## 2021-01-21 DIAGNOSIS — Z4789 Encounter for other orthopedic aftercare: Secondary | ICD-10-CM | POA: Diagnosis not present

## 2021-01-21 DIAGNOSIS — M96842 Postprocedural seroma of a musculoskeletal structure following a musculoskeletal system procedure: Secondary | ICD-10-CM

## 2021-01-21 DIAGNOSIS — M1990 Unspecified osteoarthritis, unspecified site: Secondary | ICD-10-CM | POA: Diagnosis not present

## 2021-01-21 DIAGNOSIS — F32A Depression, unspecified: Secondary | ICD-10-CM | POA: Diagnosis not present

## 2021-01-21 DIAGNOSIS — E11610 Type 2 diabetes mellitus with diabetic neuropathic arthropathy: Secondary | ICD-10-CM | POA: Diagnosis not present

## 2021-01-21 DIAGNOSIS — F419 Anxiety disorder, unspecified: Secondary | ICD-10-CM | POA: Diagnosis not present

## 2021-01-21 DIAGNOSIS — K219 Gastro-esophageal reflux disease without esophagitis: Secondary | ICD-10-CM | POA: Diagnosis not present

## 2021-01-21 DIAGNOSIS — I10 Essential (primary) hypertension: Secondary | ICD-10-CM | POA: Diagnosis not present

## 2021-01-21 MED ORDER — OXYCODONE HCL 5 MG PO CAPS: 5.0000 mg | ORAL_CAPSULE | ORAL | 0 refills | Status: DC | PRN

## 2021-01-21 MED ORDER — NALOXONE HCL 4 MG/0.1ML NA LIQD: NASAL | 0 refills | Status: DC

## 2021-01-21 NOTE — Progress Notes (Signed)
Post-Op Visit Note   Patient: Antonio Oliver           Date of Birth: 03/29/1956           MRN: [id number] Visit Date: 01/21/2021 PCP: Cher Nakai, MD   Assessment & Plan:  Chief Complaint:  Chief Complaint  Patient presents with  . Lower Back - Routine Post Op    13 days post central laminectomies at L3-4 & L4-5,c/o still being weak, and having to manually move right leg   Visit Diagnoses:  1. Lumbar radiculopathy   2. Status post lumbar laminectomy   3. Seroma of musculoskeletal structure after musculoskeletal system procedure     Plan: Avoid frequent bending and stooping  No lifting greater than 10 lbs. May use ice or moist heat for pain. Weight loss is of benefit. Best medication for lumbar disc disease is arthritis medications like motrin, celebrex and naprosyn. Exercise is important to improve your indurance and does allow people to function better inspite of back pain.    Follow-Up Instructions: Return in about 4 weeks (around 02/18/2021).   Orders:  Orders Placed This Encounter  Procedures  . XR Lumbar Spine 2-3 Views   No orders of the defined types were placed in this encounter.   Imaging: XR Lumbar Spine 2-3 Views  Result Date: 01/21/2021 AP and lateral flexion and extension radiographs of the lumbar spine demonstrate central laminectomy L2-3 and L3-4, there is no anterolisthesis noted, moderate DDD at the L1-2 through L3-4 levels, no acute changes.    PMFS History: Patient Active Problem List   Diagnosis Date Noted  . Lumbar stenosis with neurogenic claudication 01/08/2021  . Back pain 01/05/2021  . BPH (benign prostatic hyperplasia)   . Depression   . Hypertension   . Presence of permanent cardiac pacemaker 12/28/2019  . Ascending aorta dilation ([place]) 03/06/2019  . Charcot's joint of foot 03/05/2019  . Fracture of tibia 03/05/2019  . Fracture of capitate bone of wrist 02/26/2019  . Syncope 02/14/2019  . Bradycardia 02/01/2019  . Weakness    . Leukocytosis   . Nondisplaced fracture of medial cuneiform of left foot, initial encounter for closed fracture 11/16/2018  . Cervical myelopathy ([place]) 11/14/2018  . Hyperlipidemia 10/30/2018  . Vitamin D deficiency 10/30/2018  . Osteoarthritis 10/30/2018  . GERD (gastroesophageal reflux disease) 10/30/2018  . Fatigue 10/30/2018  . Hypothyroidism 10/30/2018  . Essential hypertension 10/30/2018  . Mixed dyslipidemia 10/30/2018  . Hemangioma of skin and subcutaneous tissue 07/07/2017  . Lentigo 07/07/2017  . Multiple actinic keratoses 07/07/2017  . Multiple benign melanocytic nevi 07/07/2017  . Complete heart block ([place]) 05/04/2017  . S/P AVR (aortic valve replacement) 03/10/2017  . Bicuspid aortic valve 12/22/2016  . Benign prostatic hyperplasia without lower urinary tract symptoms 11/17/2016  . Chronic inflammatory arthritis 11/17/2016  . Chronic bilateral low back pain 11/17/2016  . Heart murmur 11/17/2016  . History of kidney stones 11/17/2016  . Migraine 11/17/2016  . Anxiety 06/20/2013  . Enthesopathy of ankle and tarsus 04/04/2013  . Lesion of plantar nerve 04/04/2013  . Personal history of tobacco use, presenting hazards to health 04/04/2013  . Strain of rotator cuff 04/04/2013  . Neck pain 04/04/2013  . Status post bariatric surgery 04/04/2013  . DM2 (diabetes mellitus, type 2) ([place]) 04/03/2013  . Chronic back pain 04/03/2013  . Glaucoma 04/03/2013  . Morbid obesity ([place]) 10/27/2011   Past Medical History:  Diagnosis Date  . Anxiety 10/30/2018  . Ascending aorta dilation (  [place]) 03/06/2019  . Benign prostatic hyperplasia without lower urinary tract symptoms 11/17/2016   Last Assessment & Plan:  Stable PSA today.  Formatting of this note might be different from the original. Last Assessment & Plan:  Stable PSA today. Last Assessment & Plan:  Formatting of this note might be different from the original. Stable PSA today.  . Bicuspid aortic valve 12/22/2016  . BPH (benign  prostatic hyperplasia)   . Bradycardia 02/01/2019  . Cervical myelopathy ([place]) 11/14/2018  . Charcot's joint of foot 03/05/2019  . Chronic back pain 04/03/2013  . Chronic bilateral low back pain 11/17/2016  . Chronic inflammatory arthritis 11/17/2016   History of positive rheumatoid factor in the past. Denies placement on immunosuppressive therapy  . Clinical depression 04/03/2013   Last Assessment & Plan:  Formatting of this note might be different from the original. Depression is unchanged.  Discussed sleep hygiene. Medication changes per orders. Depression will be reassessed in 3 months Has been having drowsiness since taking clonazepam. Wishes to try Buspar daily. Discussed non-pharmacologic methods to reduce anxiety/depression such as working in [employer] and relaxation  . Compartment syndrome ([place]) 06/20/2013   Last Assessment & Plan:  Denies any problems with right leg since April/May Does have swelling at times if he is on this leg for long periods of time. Wears compression socks regularly and has helped with swelling.  . Complete heart block ([place]) 05/04/2017  . Depression   . Diabetes ([place]) 10/30/2018  . Diabetes mellitus due to underlying condition with unspecified complications ([place]) 8/54/6270  . Diabetes mellitus type 2 in nonobese (HCC)   . DM2 (diabetes mellitus, type 2) ([place]) 04/03/2013   Last Assessment & Plan:  Formatting of this note might be different from the original. Diabetes is unchanged.  Continue current treatment regimen. Regular aerobic exercise. Diabetes will be reassessed in 3 months. Will check A1c today. Denies any problems with feet or sensation.  . Enthesopathy of ankle and tarsus 04/04/2013   Last Assessment & Plan:  Continues with swelling after prolonged standing.  Still  Wearing support hose, which helps.  . Essential hypertension 10/30/2018  . Fatigue 10/30/2018  . FHx: migraine headaches 04/04/2013  . Fracture of capitate bone of wrist 02/26/2019  . Fracture of tibia  03/05/2019  . GERD (gastroesophageal reflux disease) 10/30/2018  . Glaucoma 04/03/2013  . HA (headache) 04/03/2013   Last Assessment & Plan:  Formatting of this note might be different from the original. Infrequent, however still having them.  Not sure of the triggers.  Using zomig, which helps immediately.  Marland Kitchen Heart murmur 11/17/2016  . Hemangioma of skin and subcutaneous tissue 07/07/2017  . History of bariatric surgery 04/04/2013  . History of kidney stones 11/17/2016  . Hyperlipidemia 10/30/2018  . Hypertension   . Hypothyroidism 10/30/2018  . Kidney stone 02/01/2019   Last Assessment & Plan:  Renal condition is unchanged. Continue current medications. Renal condition will be reassessed in 3 months. Discussed increasing fluids, and possibility of pain if stone moves or causes obstruction. Told to call urologist or make appointment if problem worsens.  Last Assessment & Plan:  Formatting of this note might be different from the original. Renal condition is unchan  . Lentigo 07/07/2017  . Lesion of plantar nerve 04/04/2013  . Leukocytosis   . Migraine 11/17/2016  . Mixed dyslipidemia 10/30/2018  . Morbid obesity ([place]) 10/27/2011  . Multiple actinic keratoses 07/07/2017  . Multiple benign melanocytic nevi 07/07/2017  . Neck pain 04/04/2013  .  Neoplasm of uncertain behavior of skin 07/28/2018  . Nondisplaced fracture of medial cuneiform of left foot, initial encounter for closed fracture 11/16/2018  . Nonrheumatic aortic valve stenosis 02/01/2019  . Osteoarthritis 10/30/2018  . Other seborrheic keratosis 07/07/2017  . Personal history of tobacco use, presenting hazards to health 04/04/2013  . Presence of permanent cardiac pacemaker   . S/P AVR (aortic valve replacement) 03/10/2017  . Severe single current episode of major depressive disorder, without psychotic features ([place]) 11/17/2016  . Status post bariatric surgery 04/04/2013  . Strain of rotator cuff 04/04/2013  . Surgery, elective   . Syncope 02/14/2019  .  Vitamin D deficiency 10/30/2018  . Weakness     Family History  Problem Relation Age of Onset  . Cancer Mother        breast  . Rheum arthritis Mother   . Diabetes Mother   . Heart disease Father   . Kidney disease Father   . Diabetes Father   . Hypertension Brother   . Diabetes Brother   . Osteoarthritis Brother   . Heart disease Brother   . Hypertension Brother   . Diabetes Brother   . Rheum arthritis Brother   . Depression Daughter   . Anxiety disorder Daughter     Past Surgical History:  Procedure Laterality Date  . ANTERIOR CERVICAL DECOMP/DISCECTOMY FUSION N/A 11/17/2018   Procedure: CERVICAL THREE-CERVICAL FOUR, CERVICAL FOUR-CERVICAL FIVE, CERVICAL FIVE-CERVICAL SIX ANTERIOR CERVICAL DECOMPRESSION/DISCECTOMY FUSION;  Surgeon: Earnie Larsson, MD;  Location: [facility];  Service: Neurosurgery;  Laterality: N/A;  . APPLICATION OF WOUND VAC Left 04/05/2019   Procedure: Application Of Wound Vac;  Surgeon: Erle Crocker, MD;  Location: [facility];  Service: Orthopedics;  Laterality: Left;  . BACK SURGERY     x4  . FASCIOTOMY Left 04/05/2019   Left leg 2 compartment fasciotomy  . FASCIOTOMY Left 04/05/2019   Procedure: FASCIOTOMY LEFT LOWER LEG;  Surgeon: Erle Crocker, MD;  Location: [facility];  Service: Orthopedics;  Laterality: Left;  . FOOT NEUROMA SURGERY    . GASTRIC BYPASS    . HEMATOMA EVACUATION Left 04/05/2019   Procedure: Evacuation Hematoma Left Lower Leg;  Surgeon: Erle Crocker, MD;  Location: [facility];  Service: Orthopedics;  Laterality: Left;  . HEMORRHOID SURGERY     over 30 years ago  . HERNIA REPAIR     LIH umb  . LUMBAR LAMINECTOMY/DECOMPRESSION MICRODISCECTOMY N/A 01/08/2021   Procedure: CENTRAL LUMBAR LAMINECTOMY LUMBAR TWO-THREE, LUMBAR THREE-FOUR;  Surgeon: Jessy Oto, MD;  Location: [facility];  Service: Orthopedics;  Laterality: N/A;  . NASAL SINUS SURGERY     x2  . NECK SURGERY     Social History   Occupational History  . Not on file  Tobacco  Use  . Smoking status: Never Smoker  . Smokeless tobacco: Never Used  Vaping Use  . Vaping Use: Never used  Substance and Sexual Activity  . Alcohol use: Yes    Alcohol/week: 1.0 standard drink    Types: 1 Glasses of wine per week    Comment: occ.  . Drug use: No  . Sexual activity: Not on file

## 2021-01-21 NOTE — Patient Instructions (Addendum)
Plan: Avoid frequent bending and stooping  No lifting greater than 10 lbs. May use ice or moist heat for pain. Weight loss is of benefit. Home health for continued therapy. Exercise is important to improve your indurance and does allow people to function better inspite of back pain.

## 2021-01-22 ENCOUNTER — Ambulatory Visit: Payer: Medicare PPO | Admitting: Surgery

## 2021-01-23 DIAGNOSIS — K219 Gastro-esophageal reflux disease without esophagitis: Secondary | ICD-10-CM | POA: Diagnosis not present

## 2021-01-23 DIAGNOSIS — E11610 Type 2 diabetes mellitus with diabetic neuropathic arthropathy: Secondary | ICD-10-CM | POA: Diagnosis not present

## 2021-01-23 DIAGNOSIS — F419 Anxiety disorder, unspecified: Secondary | ICD-10-CM | POA: Diagnosis not present

## 2021-01-23 DIAGNOSIS — Z4789 Encounter for other orthopedic aftercare: Secondary | ICD-10-CM | POA: Diagnosis not present

## 2021-01-23 DIAGNOSIS — M48062 Spinal stenosis, lumbar region with neurogenic claudication: Secondary | ICD-10-CM | POA: Diagnosis not present

## 2021-01-23 DIAGNOSIS — I442 Atrioventricular block, complete: Secondary | ICD-10-CM | POA: Diagnosis not present

## 2021-01-23 DIAGNOSIS — I10 Essential (primary) hypertension: Secondary | ICD-10-CM | POA: Diagnosis not present

## 2021-01-23 DIAGNOSIS — F32A Depression, unspecified: Secondary | ICD-10-CM | POA: Diagnosis not present

## 2021-01-23 DIAGNOSIS — M1990 Unspecified osteoarthritis, unspecified site: Secondary | ICD-10-CM | POA: Diagnosis not present

## 2021-01-26 ENCOUNTER — Telehealth: Payer: Self-pay

## 2021-01-26 NOTE — Telephone Encounter (Signed)
Patient's wife [name] called stating that pocket has filled back up with blood and wanted to know if they should wait until the F/U on June 1st or be seen sooner?  CB# [phone].  Please advise.  Thank you

## 2021-01-26 NOTE — Telephone Encounter (Signed)
Worked in 01/28/21 @ 915.

## 2021-01-27 DIAGNOSIS — M1990 Unspecified osteoarthritis, unspecified site: Secondary | ICD-10-CM | POA: Diagnosis not present

## 2021-01-27 DIAGNOSIS — I10 Essential (primary) hypertension: Secondary | ICD-10-CM | POA: Diagnosis not present

## 2021-01-27 DIAGNOSIS — E11610 Type 2 diabetes mellitus with diabetic neuropathic arthropathy: Secondary | ICD-10-CM | POA: Diagnosis not present

## 2021-01-27 DIAGNOSIS — F32A Depression, unspecified: Secondary | ICD-10-CM | POA: Diagnosis not present

## 2021-01-27 DIAGNOSIS — M48062 Spinal stenosis, lumbar region with neurogenic claudication: Secondary | ICD-10-CM | POA: Diagnosis not present

## 2021-01-27 DIAGNOSIS — F419 Anxiety disorder, unspecified: Secondary | ICD-10-CM | POA: Diagnosis not present

## 2021-01-27 DIAGNOSIS — Z4789 Encounter for other orthopedic aftercare: Secondary | ICD-10-CM | POA: Diagnosis not present

## 2021-01-27 DIAGNOSIS — K219 Gastro-esophageal reflux disease without esophagitis: Secondary | ICD-10-CM | POA: Diagnosis not present

## 2021-01-27 DIAGNOSIS — I442 Atrioventricular block, complete: Secondary | ICD-10-CM | POA: Diagnosis not present

## 2021-01-27 NOTE — Progress Notes (Deleted)
NEUROLOGY FOLLOW UP OFFICE NOTE  [name] [id number]  Assessment/Plan:   1.  Migraine without aura, without status migrainosus, not intractable 2.  L3-4 spinal stenosis s/p laminectomy  1.  Migraine prevention:  Emgality 2.  Migraine rescue:  Ubrelvy 100mg  3.  Limit use of pain relievers to no more than 2 days out of week to prevent risk of rebound or medication-overuse headache. 4.  Keep headache diary 5.  Follow up ***  Subjective:  Antonio Oliver is a 65 year old male with complete heart block s/p PPM,possiblerheumatoid arthritis, diabetes, depression/anxiety, chronic back pain and history of cervical myelopathy follows up for migraines  UPDATE: Earlier this month, patient developed severe low back pain with radicular pain down the right leg. MRI of lumbar spine showed moderate to severe multifactorial spinal stenosis at L3-4.  Underwent laminectomy.  ***  Intensity:moderate Duration:30 minutes to 3 hours Frequency:2 migraines in past 5 months.. Rescue therapy: Roselyn Meier Current NSAIDS:ASA 81mg  Current analgesics:tramadol (daily,for generalized pain); oxycodone (for general pain) Current triptans:none Current ergotamine:none Current anti-emetic:none Current muscle relaxants:tizanidine Current anti-anxiolytic:BuSpar; clonazepam Current sleep aide:none Current Antihypertensive medications:Toprol-XL,Lasix Current Antidepressant medications:Cymbalta 60mg  twice daily, Wellbutrin SR 200mg  twice daily, Remeron 60mg  Current Anticonvulsant medications:gabapentin 600mg  TID Current anti-CGRP:Emgality; Ubrelvy 100mg  Current Vitamins/Herbal/Supplements:Iron, folic acid, MVI Current Antihistamines/Decongestants:Sudafed Other therapy:none Other medications:Mirapex  Caffeine:1 cup coffee daily. Diet:Does not drink water or soda. Exercise:no Depression:yes; Anxiety:Yes. Major depressive disorder. Needed to retire  in 2002.  Other pain:Arthralgias, chronic neck pain, Charcot foot Sleep hygiene:poor  HISTORY: Onset: 1980s Location:Usually bifrontal Quality:Squeezing, throbbing, pounding InitialIntensity:7-8/10.Hedenies new headache, thunderclap headache or severe headache that wakes from sleep. Aura:none Premonitory Phase:none Postdrome:none Associated symptoms:Photophobia, phonophobia, blurred vision.Hedenies associated nausea, vomiting,unilateral numbness or weakness. InitialDuration:Usually a day. Has happened up to 3 days. InitialFrequency:10 to 15 days a month, started Botox in 2018and improvedonce a month. InitialFrequency of abortive medication:1 a month Triggers: Unknown Relieving factors:none Activity:aggravates  CT head 11/14/18 personally reviewed and was normal. CT cervical spine 11/06/18 personally reviewed and demonstrated multilevel severe facet arthropathy with moderate bilateral neural foraminal stenosis at C3-4 and C4-5 as well as prior interbody fusion at C6-7 without stenosis.  Past NSAIDS:ibuprofen Past analgesics:Tylenol, Excedrin, Fioricet, Midrin Past abortive triptans:Zomig 5mg ,Sumatriptan 6mg   Past abortive ergotamine:none Past muscle relaxants:Zanaflex Past anti-emetic:none Past antihypertensive medications:Verapamil, propranolol, clonidine Past antidepressant medications:amitriptyline Past anticonvulsant medications:topiramate 200mg  Past anti-CGRP:Aimovig 140mg  Past vitamins/Herbal/Supplements:none Past antihistamines/decongestants:none Other past therapies:Botox(effective but did not like getting all those injections every 3 months)  Family history of headache:2 brothers had migraines.  PAST MEDICAL HISTORY: Past Medical History:  Diagnosis Date  . Anxiety 10/30/2018  . Ascending aorta dilation ([place]) 03/06/2019  . Benign prostatic hyperplasia without lower urinary tract symptoms  11/17/2016   Last Assessment & Plan:  Stable PSA today.  Formatting of this note might be different from the original. Last Assessment & Plan:  Stable PSA today. Last Assessment & Plan:  Formatting of this note might be different from the original. Stable PSA today.  . Bicuspid aortic valve 12/22/2016  . BPH (benign prostatic hyperplasia)   . Bradycardia 02/01/2019  . Cervical myelopathy ([place]) 11/14/2018  . Charcot's joint of foot 03/05/2019  . Chronic back pain 04/03/2013  . Chronic bilateral low back pain 11/17/2016  . Chronic inflammatory arthritis 11/17/2016   History of positive rheumatoid factor in the past. Denies placement on immunosuppressive therapy  . Clinical depression 04/03/2013   Last Assessment & Plan:  Formatting of this note might be different from the  original. Depression is unchanged.  Discussed sleep hygiene. Medication changes per orders. Depression will be reassessed in 3 months Has been having drowsiness since taking clonazepam. Wishes to try Buspar daily. Discussed non-pharmacologic methods to reduce anxiety/depression such as working in [employer] and relaxation  . Compartment syndrome ([place]) 06/20/2013   Last Assessment & Plan:  Denies any problems with right leg since April/May Does have swelling at times if he is on this leg for long periods of time. Wears compression socks regularly and has helped with swelling.  . Complete heart block ([place]) 05/04/2017  . Depression   . Diabetes ([place]) 10/30/2018  . Diabetes mellitus due to underlying condition with unspecified complications ([place]) 0/94/7096  . Diabetes mellitus type 2 in nonobese (HCC)   . DM2 (diabetes mellitus, type 2) ([place]) 04/03/2013   Last Assessment & Plan:  Formatting of this note might be different from the original. Diabetes is unchanged.  Continue current treatment regimen. Regular aerobic exercise. Diabetes will be reassessed in 3 months. Will check A1c today. Denies any problems with feet or sensation.  . Enthesopathy  of ankle and tarsus 04/04/2013   Last Assessment & Plan:  Continues with swelling after prolonged standing.  Still  Wearing support hose, which helps.  . Essential hypertension 10/30/2018  . Fatigue 10/30/2018  . FHx: migraine headaches 04/04/2013  . Fracture of capitate bone of wrist 02/26/2019  . Fracture of tibia 03/05/2019  . GERD (gastroesophageal reflux disease) 10/30/2018  . Glaucoma 04/03/2013  . HA (headache) 04/03/2013   Last Assessment & Plan:  Formatting of this note might be different from the original. Infrequent, however still having them.  Not sure of the triggers.  Using zomig, which helps immediately.  Marland Kitchen Heart murmur 11/17/2016  . Hemangioma of skin and subcutaneous tissue 07/07/2017  . History of bariatric surgery 04/04/2013  . History of kidney stones 11/17/2016  . Hyperlipidemia 10/30/2018  . Hypertension   . Hypothyroidism 10/30/2018  . Kidney stone 02/01/2019   Last Assessment & Plan:  Renal condition is unchanged. Continue current medications. Renal condition will be reassessed in 3 months. Discussed increasing fluids, and possibility of pain if stone moves or causes obstruction. Told to call urologist or make appointment if problem worsens.  Last Assessment & Plan:  Formatting of this note might be different from the original. Renal condition is unchan  . Lentigo 07/07/2017  . Lesion of plantar nerve 04/04/2013  . Leukocytosis   . Migraine 11/17/2016  . Mixed dyslipidemia 10/30/2018  . Morbid obesity ([place]) 10/27/2011  . Multiple actinic keratoses 07/07/2017  . Multiple benign melanocytic nevi 07/07/2017  . Neck pain 04/04/2013  . Neoplasm of uncertain behavior of skin 07/28/2018  . Nondisplaced fracture of medial cuneiform of left foot, initial encounter for closed fracture 11/16/2018  . Nonrheumatic aortic valve stenosis 02/01/2019  . Osteoarthritis 10/30/2018  . Other seborrheic keratosis 07/07/2017  . Personal history of tobacco use, presenting hazards to health 04/04/2013  .  Presence of permanent cardiac pacemaker   . S/P AVR (aortic valve replacement) 03/10/2017  . Severe single current episode of major depressive disorder, without psychotic features ([place]) 11/17/2016  . Status post bariatric surgery 04/04/2013  . Strain of rotator cuff 04/04/2013  . Surgery, elective   . Syncope 02/14/2019  . Vitamin D deficiency 10/30/2018  . Weakness     MEDICATIONS: Current Outpatient Medications on File Prior to Visit  Medication Sig Dispense Refill  . acetaminophen (TYLENOL) 500 MG tablet Take 2 tablets (1,000  mg total) by mouth every 8 (eight) hours.    Marland Kitchen aspirin EC 81 MG tablet Take 81 mg by mouth daily.    Marland Kitchen atorvastatin (LIPITOR) 20 MG tablet Take 20 mg by mouth daily at 6 PM.     . brexpiprazole (REXULTI) 2 MG TABS tablet Take 2 mg by mouth daily.     Marland Kitchen buPROPion (WELLBUTRIN SR) 200 MG 12 hr tablet Take 200 mg by mouth 2 (two) times daily.    . busPIRone (BUSPAR) 10 MG tablet Take 20 mg by mouth 2 (two) times daily.     . calcium citrate (CALCITRATE - DOSED IN MG ELEMENTAL CALCIUM) 950 MG tablet Take 200 mg of elemental calcium by mouth 2 (two) times daily.    . Cholecalciferol 125 MCG (5000 UT) TABS Take 125 mcg by mouth daily.    . clonazePAM (KLONOPIN) 0.5 MG tablet Take 0.5 mg by mouth 2 (two) times daily as needed for anxiety.    . diclofenac Sodium (VOLTAREN) 1 % GEL Apply 1 application topically 4 (four) times daily.    Marland Kitchen docusate sodium (COLACE) 100 MG capsule Take 1 capsule (100 mg total) by mouth 2 (two) times daily. 10 capsule 0  . DULoxetine (CYMBALTA) 60 MG capsule Take 60 mg by mouth 2 (two) times daily.     . fexofenadine (ALLEGRA) 180 MG tablet Take 180 mg by mouth daily as needed for allergies.    . finasteride (PROSCAR) 5 MG tablet Take 5 mg by mouth daily.    . fluticasone (FLONASE) 50 MCG/ACT nasal spray Place 2 sprays into the nose daily as needed for allergies or rhinitis.    . folic acid (FOLVITE) 1 MG tablet Take 1 mg by mouth daily.    .  furosemide (LASIX) 20 MG tablet Take 1 tablet (20 mg total) by mouth as needed (for leg swelling). (Patient taking differently: Take 20 mg by mouth daily as needed for fluid.) 30 tablet 4  . gabapentin (NEURONTIN) 600 MG tablet Take 600-1,200 mg by mouth 3 (three) times daily.    . Galcanezumab-gnlm (EMGALITY) 120 MG/ML SOAJ Inject 120 mg into the skin every 28 (twenty-eight) days. 1 mL 0  . levothyroxine (SYNTHROID) 125 MCG tablet Take 125 mcg by mouth daily.     Marland Kitchen lisinopril (ZESTRIL) 5 MG tablet Take 5 mg daily and as needed for BP greater than 160. (Patient taking differently: Take 5 mg by mouth daily.) 135 tablet 3  . Magnesium 500 MG CAPS Take 500 mg by mouth 2 (two) times a day.     . metFORMIN (GLUCOPHAGE) 1000 MG tablet Take 1,000 mg by mouth 2 (two) times daily with a meal.    . metoprolol succinate (TOPROL-XL) 50 MG 24 hr tablet Take 25 mg by mouth at bedtime. Take with or immediately following a meal.    . mirtazapine (REMERON) 30 MG tablet Take 30 mg by mouth at bedtime.     . Multiple Vitamins-Minerals (MULTIVITAMIN WITH MINERALS) tablet Take 1 tablet by mouth daily.    . mupirocin ointment (BACTROBAN) 2 % Apply 1 application topically 3 (three) times daily as needed (infection).    . naloxone (NARCAN) nasal spray 4 mg/0.1 mL Instill or spray into the nose if you are experiencing shortness of breath or signs of opioid overdose. 1 each 0  . omeprazole (PRILOSEC) 20 MG capsule Take 20 mg by mouth 2 (two) times daily before a meal.     . oxycodone (OXY-IR) 5 MG capsule  Take 1 capsule (5 mg total) by mouth every 4 (four) hours as needed. 30 capsule 0  . oxyCODONE-acetaminophen (PERCOCET) 10-325 MG tablet Take 1 tablet by mouth 3 (three) times daily.    . polyethylene glycol (MIRALAX / GLYCOLAX) 17 g packet Take 17 g by mouth daily. 14 each 0  . potassium chloride SA (KLOR-CON) 20 MEQ tablet Take 20 mEq by mouth daily as needed (potassium defficiency). With lasix    . Semaglutide (OZEMPIC,  0.25 OR 0.5 MG/DOSE, [place]) Inject 0.5 mg into the skin once a week.    . senna-docusate (SENOKOT-S) 8.6-50 MG tablet Take 1 tablet by mouth 2 (two) times daily for 14 days. 28 tablet 0  . solifenacin (VESICARE) 10 MG tablet Take 10 mg by mouth daily.    Marland Kitchen testosterone cypionate (DEPOTESTOSTERONE CYPIONATE) 200 MG/ML injection Inject 200 mg into the muscle every 30 (thirty) days.    Marland Kitchen tiZANidine (ZANAFLEX) 4 MG capsule Take 4-8 mg by mouth 3 (three) times daily as needed for muscle spasms.    . traMADol (ULTRAM) 50 MG tablet Take 50 mg by mouth 3 (three) times daily.    Marland Kitchen Ubrogepant 100 MG TABS Take 100 mg by mouth daily as needed for migraine.    Marland Kitchen zolpidem (AMBIEN) 10 MG tablet Take 1 tablet (10 mg total) by mouth at bedtime for 14 days. 14 tablet 0   No current facility-administered medications on file prior to visit.    ALLERGIES: Allergies  Allergen Reactions  . Morphine Itching    Can take with benadry, facial and nose itching     FAMILY HISTORY: Family History  Problem Relation Age of Onset  . Cancer Mother        breast  . Rheum arthritis Mother   . Diabetes Mother   . Heart disease Father   . Kidney disease Father   . Diabetes Father   . Hypertension Brother   . Diabetes Brother   . Osteoarthritis Brother   . Heart disease Brother   . Hypertension Brother   . Diabetes Brother   . Rheum arthritis Brother   . Depression Daughter   . Anxiety disorder Daughter       Objective:  *** General: No acute distress.  Patient appears ***-groomed.   Head:  Normocephalic/atraumatic Eyes:  Fundi examined but not visualized Neck: supple, no paraspinal tenderness, full range of motion Heart:  Regular rate and rhythm Lungs:  Clear to auscultation bilaterally Back: No paraspinal tenderness Neurological Exam: alert and oriented to person, place, and time. Attention span and concentration intact, recent and remote memory intact, fund of knowledge intact.  Speech fluent and not  dysarthric, language intact.  CN II-XII intact. Bulk and tone normal, muscle strength 5/5 throughout.  Sensation to light touch, temperature and vibration intact.  Deep tendon reflexes 2+ throughout, toes downgoing.  Finger to nose and heel to shin testing intact.  Gait normal, Romberg negative.     Metta Clines, DO  CC: ***

## 2021-01-28 ENCOUNTER — Ambulatory Visit: Payer: Medicare PPO | Admitting: Neurology

## 2021-01-28 ENCOUNTER — Ambulatory Visit (INDEPENDENT_AMBULATORY_CARE_PROVIDER_SITE_OTHER): Payer: Medicare PPO | Admitting: Specialist

## 2021-01-28 ENCOUNTER — Other Ambulatory Visit: Payer: Self-pay

## 2021-01-28 VITALS — BP 145/93 | HR 72 | Ht 75.0 in | Wt 199.0 lb

## 2021-01-28 DIAGNOSIS — Z4789 Encounter for other orthopedic aftercare: Secondary | ICD-10-CM | POA: Diagnosis not present

## 2021-01-28 DIAGNOSIS — I10 Essential (primary) hypertension: Secondary | ICD-10-CM | POA: Diagnosis not present

## 2021-01-28 DIAGNOSIS — I442 Atrioventricular block, complete: Secondary | ICD-10-CM | POA: Diagnosis not present

## 2021-01-28 DIAGNOSIS — F32A Depression, unspecified: Secondary | ICD-10-CM | POA: Diagnosis not present

## 2021-01-28 DIAGNOSIS — E11610 Type 2 diabetes mellitus with diabetic neuropathic arthropathy: Secondary | ICD-10-CM | POA: Diagnosis not present

## 2021-01-28 DIAGNOSIS — M48062 Spinal stenosis, lumbar region with neurogenic claudication: Secondary | ICD-10-CM | POA: Diagnosis not present

## 2021-01-28 DIAGNOSIS — F419 Anxiety disorder, unspecified: Secondary | ICD-10-CM | POA: Diagnosis not present

## 2021-01-28 DIAGNOSIS — K219 Gastro-esophageal reflux disease without esophagitis: Secondary | ICD-10-CM | POA: Diagnosis not present

## 2021-01-28 DIAGNOSIS — M96842 Postprocedural seroma of a musculoskeletal structure following a musculoskeletal system procedure: Secondary | ICD-10-CM

## 2021-01-28 DIAGNOSIS — M5416 Radiculopathy, lumbar region: Secondary | ICD-10-CM

## 2021-01-28 DIAGNOSIS — M1990 Unspecified osteoarthritis, unspecified site: Secondary | ICD-10-CM | POA: Diagnosis not present

## 2021-01-28 DIAGNOSIS — Z9889 Other specified postprocedural states: Secondary | ICD-10-CM

## 2021-01-28 NOTE — Progress Notes (Signed)
Post-Op Visit Note   Patient: Antonio Oliver           Date of Birth: 10-17-1955           MRN: [id number] Visit Date: 01/28/2021 PCP: Cher Nakai, MD   Assessment & Plan: 3weeks post op L3-4 and L2-3 central laminectomy for spinal stenosis above fusion L4 to S1.   Chief Complaint:  Chief Complaint  Patient presents with  . Lower Back - Follow-up, Wound Check    Drain Seroma   Visit Diagnoses:  1. Seroma of musculoskeletal structure after musculoskeletal system procedure   2. Status post lumbar laminectomy   3. Lumbar radiculopathy   Seroma lumbar laminectomy site reaccumulated, aspirated 15 cc serosangineuous thin fluid No eyrthrema or induration. Bed to wheelchair PT with home health.  Plan: Plan: Avoid frequent bending and stooping  No lifting greater than 10 lbs. May use ice or moist heat for pain. Weight loss is of benefit. Best medication for lumbar disc disease is arthritis medications like motrin, celebrex and naprosyn. Exercise is important to improve your indurance and does allow people to function better inspite of back pain.  Follow-Up Instructions: Return in about 1 week (around 02/04/2021) for Keep appointment already scheduled..   Orders:  No orders of the defined types were placed in this encounter.  No orders of the defined types were placed in this encounter.   Imaging: No results found.  PMFS History: Patient Active Problem List   Diagnosis Date Noted  . Lumbar stenosis with neurogenic claudication 01/08/2021  . Back pain 01/05/2021  . BPH (benign prostatic hyperplasia)   . Depression   . Hypertension   . Presence of permanent cardiac pacemaker 12/28/2019  . Ascending aorta dilation ([place]) 03/06/2019  . Charcot's joint of foot 03/05/2019  . Fracture of tibia 03/05/2019  . Fracture of capitate bone of wrist 02/26/2019  . Syncope 02/14/2019  . Bradycardia 02/01/2019  . Weakness   . Leukocytosis   . Nondisplaced fracture of medial cuneiform  of left foot, initial encounter for closed fracture 11/16/2018  . Cervical myelopathy ([place]) 11/14/2018  . Hyperlipidemia 10/30/2018  . Vitamin D deficiency 10/30/2018  . Osteoarthritis 10/30/2018  . GERD (gastroesophageal reflux disease) 10/30/2018  . Fatigue 10/30/2018  . Hypothyroidism 10/30/2018  . Essential hypertension 10/30/2018  . Mixed dyslipidemia 10/30/2018  . Hemangioma of skin and subcutaneous tissue 07/07/2017  . Lentigo 07/07/2017  . Multiple actinic keratoses 07/07/2017  . Multiple benign melanocytic nevi 07/07/2017  . Complete heart block ([place]) 05/04/2017  . S/P AVR (aortic valve replacement) 03/10/2017  . Bicuspid aortic valve 12/22/2016  . Benign prostatic hyperplasia without lower urinary tract symptoms 11/17/2016  . Chronic inflammatory arthritis 11/17/2016  . Chronic bilateral low back pain 11/17/2016  . Heart murmur 11/17/2016  . History of kidney stones 11/17/2016  . Migraine 11/17/2016  . Anxiety 06/20/2013  . Enthesopathy of ankle and tarsus 04/04/2013  . Lesion of plantar nerve 04/04/2013  . Personal history of tobacco use, presenting hazards to health 04/04/2013  . Strain of rotator cuff 04/04/2013  . Neck pain 04/04/2013  . Status post bariatric surgery 04/04/2013  . DM2 (diabetes mellitus, type 2) ([place]) 04/03/2013  . Chronic back pain 04/03/2013  . Glaucoma 04/03/2013  . Morbid obesity ([place]) 10/27/2011   Past Medical History:  Diagnosis Date  . Anxiety 10/30/2018  . Ascending aorta dilation ([place]) 03/06/2019  . Benign prostatic hyperplasia without lower urinary tract symptoms 11/17/2016   Last Assessment & Plan:  Stable PSA today.  Formatting of this note might be different from the original. Last Assessment & Plan:  Stable PSA today. Last Assessment & Plan:  Formatting of this note might be different from the original. Stable PSA today.  . Bicuspid aortic valve 12/22/2016  . BPH (benign prostatic hyperplasia)   . Bradycardia 02/01/2019  . Cervical  myelopathy ([place]) 11/14/2018  . Charcot's joint of foot 03/05/2019  . Chronic back pain 04/03/2013  . Chronic bilateral low back pain 11/17/2016  . Chronic inflammatory arthritis 11/17/2016   History of positive rheumatoid factor in the past. Denies placement on immunosuppressive therapy  . Clinical depression 04/03/2013   Last Assessment & Plan:  Formatting of this note might be different from the original. Depression is unchanged.  Discussed sleep hygiene. Medication changes per orders. Depression will be reassessed in 3 months Has been having drowsiness since taking clonazepam. Wishes to try Buspar daily. Discussed non-pharmacologic methods to reduce anxiety/depression such as working in [employer] and relaxation  . Compartment syndrome ([place]) 06/20/2013   Last Assessment & Plan:  Denies any problems with right leg since April/May Does have swelling at times if he is on this leg for long periods of time. Wears compression socks regularly and has helped with swelling.  . Complete heart block ([place]) 05/04/2017  . Depression   . Diabetes ([place]) 10/30/2018  . Diabetes mellitus due to underlying condition with unspecified complications ([place]) 1/66/0630  . Diabetes mellitus type 2 in nonobese (HCC)   . DM2 (diabetes mellitus, type 2) ([place]) 04/03/2013   Last Assessment & Plan:  Formatting of this note might be different from the original. Diabetes is unchanged.  Continue current treatment regimen. Regular aerobic exercise. Diabetes will be reassessed in 3 months. Will check A1c today. Denies any problems with feet or sensation.  . Enthesopathy of ankle and tarsus 04/04/2013   Last Assessment & Plan:  Continues with swelling after prolonged standing.  Still  Wearing support hose, which helps.  . Essential hypertension 10/30/2018  . Fatigue 10/30/2018  . FHx: migraine headaches 04/04/2013  . Fracture of capitate bone of wrist 02/26/2019  . Fracture of tibia 03/05/2019  . GERD (gastroesophageal reflux disease) 10/30/2018   . Glaucoma 04/03/2013  . HA (headache) 04/03/2013   Last Assessment & Plan:  Formatting of this note might be different from the original. Infrequent, however still having them.  Not sure of the triggers.  Using zomig, which helps immediately.  Marland Kitchen Heart murmur 11/17/2016  . Hemangioma of skin and subcutaneous tissue 07/07/2017  . History of bariatric surgery 04/04/2013  . History of kidney stones 11/17/2016  . Hyperlipidemia 10/30/2018  . Hypertension   . Hypothyroidism 10/30/2018  . Kidney stone 02/01/2019   Last Assessment & Plan:  Renal condition is unchanged. Continue current medications. Renal condition will be reassessed in 3 months. Discussed increasing fluids, and possibility of pain if stone moves or causes obstruction. Told to call urologist or make appointment if problem worsens.  Last Assessment & Plan:  Formatting of this note might be different from the original. Renal condition is unchan  . Lentigo 07/07/2017  . Lesion of plantar nerve 04/04/2013  . Leukocytosis   . Migraine 11/17/2016  . Mixed dyslipidemia 10/30/2018  . Morbid obesity ([place]) 10/27/2011  . Multiple actinic keratoses 07/07/2017  . Multiple benign melanocytic nevi 07/07/2017  . Neck pain 04/04/2013  . Neoplasm of uncertain behavior of skin 07/28/2018  . Nondisplaced fracture of medial cuneiform of left foot, initial  encounter for closed fracture 11/16/2018  . Nonrheumatic aortic valve stenosis 02/01/2019  . Osteoarthritis 10/30/2018  . Other seborrheic keratosis 07/07/2017  . Personal history of tobacco use, presenting hazards to health 04/04/2013  . Presence of permanent cardiac pacemaker   . S/P AVR (aortic valve replacement) 03/10/2017  . Severe single current episode of major depressive disorder, without psychotic features ([place]) 11/17/2016  . Status post bariatric surgery 04/04/2013  . Strain of rotator cuff 04/04/2013  . Surgery, elective   . Syncope 02/14/2019  . Vitamin D deficiency 10/30/2018  . Weakness     Family  History  Problem Relation Age of Onset  . Cancer Mother        breast  . Rheum arthritis Mother   . Diabetes Mother   . Heart disease Father   . Kidney disease Father   . Diabetes Father   . Hypertension Brother   . Diabetes Brother   . Osteoarthritis Brother   . Heart disease Brother   . Hypertension Brother   . Diabetes Brother   . Rheum arthritis Brother   . Depression Daughter   . Anxiety disorder Daughter     Past Surgical History:  Procedure Laterality Date  . ANTERIOR CERVICAL DECOMP/DISCECTOMY FUSION N/A 11/17/2018   Procedure: CERVICAL THREE-CERVICAL FOUR, CERVICAL FOUR-CERVICAL FIVE, CERVICAL FIVE-CERVICAL SIX ANTERIOR CERVICAL DECOMPRESSION/DISCECTOMY FUSION;  Surgeon: Earnie Larsson, MD;  Location: [facility];  Service: Neurosurgery;  Laterality: N/A;  . APPLICATION OF WOUND VAC Left 04/05/2019   Procedure: Application Of Wound Vac;  Surgeon: Erle Crocker, MD;  Location: [facility];  Service: Orthopedics;  Laterality: Left;  . BACK SURGERY     x4  . FASCIOTOMY Left 04/05/2019   Left leg 2 compartment fasciotomy  . FASCIOTOMY Left 04/05/2019   Procedure: FASCIOTOMY LEFT LOWER LEG;  Surgeon: Erle Crocker, MD;  Location: [facility];  Service: Orthopedics;  Laterality: Left;  . FOOT NEUROMA SURGERY    . GASTRIC BYPASS    . HEMATOMA EVACUATION Left 04/05/2019   Procedure: Evacuation Hematoma Left Lower Leg;  Surgeon: Erle Crocker, MD;  Location: [facility];  Service: Orthopedics;  Laterality: Left;  . HEMORRHOID SURGERY     over 30 years ago  . HERNIA REPAIR     LIH umb  . LUMBAR LAMINECTOMY/DECOMPRESSION MICRODISCECTOMY N/A 01/08/2021   Procedure: CENTRAL LUMBAR LAMINECTOMY LUMBAR TWO-THREE, LUMBAR THREE-FOUR;  Surgeon: Jessy Oto, MD;  Location: [facility];  Service: Orthopedics;  Laterality: N/A;  . NASAL SINUS SURGERY     x2  . NECK SURGERY     Social History   Occupational History  . Not on file  Tobacco Use  . Smoking status: Never Smoker  . Smokeless  tobacco: Never Used  Vaping Use  . Vaping Use: Never used  Substance and Sexual Activity  . Alcohol use: Yes    Alcohol/week: 1.0 standard drink    Types: 1 Glasses of wine per week    Comment: occ.  . Drug use: No  . Sexual activity: Not on file

## 2021-01-29 DIAGNOSIS — K219 Gastro-esophageal reflux disease without esophagitis: Secondary | ICD-10-CM | POA: Diagnosis not present

## 2021-01-29 DIAGNOSIS — R000 Tachycardia, unspecified: Secondary | ICD-10-CM | POA: Diagnosis not present

## 2021-01-29 DIAGNOSIS — Z4789 Encounter for other orthopedic aftercare: Secondary | ICD-10-CM | POA: Diagnosis not present

## 2021-01-29 DIAGNOSIS — Z794 Long term (current) use of insulin: Secondary | ICD-10-CM | POA: Diagnosis not present

## 2021-01-29 DIAGNOSIS — M1990 Unspecified osteoarthritis, unspecified site: Secondary | ICD-10-CM | POA: Diagnosis not present

## 2021-01-29 DIAGNOSIS — I442 Atrioventricular block, complete: Secondary | ICD-10-CM | POA: Diagnosis not present

## 2021-01-29 DIAGNOSIS — F32A Depression, unspecified: Secondary | ICD-10-CM | POA: Diagnosis not present

## 2021-01-29 DIAGNOSIS — E039 Hypothyroidism, unspecified: Secondary | ICD-10-CM | POA: Diagnosis not present

## 2021-01-29 DIAGNOSIS — F419 Anxiety disorder, unspecified: Secondary | ICD-10-CM | POA: Diagnosis not present

## 2021-01-29 DIAGNOSIS — E11610 Type 2 diabetes mellitus with diabetic neuropathic arthropathy: Secondary | ICD-10-CM | POA: Diagnosis not present

## 2021-01-29 DIAGNOSIS — N3281 Overactive bladder: Secondary | ICD-10-CM | POA: Diagnosis not present

## 2021-01-29 DIAGNOSIS — E785 Hyperlipidemia, unspecified: Secondary | ICD-10-CM | POA: Diagnosis not present

## 2021-01-29 DIAGNOSIS — F3341 Major depressive disorder, recurrent, in partial remission: Secondary | ICD-10-CM | POA: Diagnosis not present

## 2021-01-29 DIAGNOSIS — M48062 Spinal stenosis, lumbar region with neurogenic claudication: Secondary | ICD-10-CM | POA: Diagnosis not present

## 2021-01-29 DIAGNOSIS — E1121 Type 2 diabetes mellitus with diabetic nephropathy: Secondary | ICD-10-CM | POA: Diagnosis not present

## 2021-01-29 DIAGNOSIS — I10 Essential (primary) hypertension: Secondary | ICD-10-CM | POA: Diagnosis not present

## 2021-01-29 DIAGNOSIS — E291 Testicular hypofunction: Secondary | ICD-10-CM | POA: Diagnosis not present

## 2021-01-29 DIAGNOSIS — N400 Enlarged prostate without lower urinary tract symptoms: Secondary | ICD-10-CM | POA: Diagnosis not present

## 2021-01-30 NOTE — Progress Notes (Signed)
Remote pacemaker transmission.   

## 2021-02-03 DIAGNOSIS — M1990 Unspecified osteoarthritis, unspecified site: Secondary | ICD-10-CM | POA: Diagnosis not present

## 2021-02-03 DIAGNOSIS — I442 Atrioventricular block, complete: Secondary | ICD-10-CM | POA: Diagnosis not present

## 2021-02-03 DIAGNOSIS — F419 Anxiety disorder, unspecified: Secondary | ICD-10-CM | POA: Diagnosis not present

## 2021-02-03 DIAGNOSIS — M48062 Spinal stenosis, lumbar region with neurogenic claudication: Secondary | ICD-10-CM | POA: Diagnosis not present

## 2021-02-03 DIAGNOSIS — Z4789 Encounter for other orthopedic aftercare: Secondary | ICD-10-CM | POA: Diagnosis not present

## 2021-02-03 DIAGNOSIS — E11610 Type 2 diabetes mellitus with diabetic neuropathic arthropathy: Secondary | ICD-10-CM | POA: Diagnosis not present

## 2021-02-03 DIAGNOSIS — K219 Gastro-esophageal reflux disease without esophagitis: Secondary | ICD-10-CM | POA: Diagnosis not present

## 2021-02-03 DIAGNOSIS — F32A Depression, unspecified: Secondary | ICD-10-CM | POA: Diagnosis not present

## 2021-02-03 DIAGNOSIS — E1121 Type 2 diabetes mellitus with diabetic nephropathy: Secondary | ICD-10-CM | POA: Diagnosis not present

## 2021-02-03 DIAGNOSIS — I10 Essential (primary) hypertension: Secondary | ICD-10-CM | POA: Diagnosis not present

## 2021-02-04 ENCOUNTER — Ambulatory Visit (INDEPENDENT_AMBULATORY_CARE_PROVIDER_SITE_OTHER): Payer: Medicare PPO | Admitting: Specialist

## 2021-02-04 ENCOUNTER — Other Ambulatory Visit: Payer: Self-pay

## 2021-02-04 ENCOUNTER — Encounter: Payer: Self-pay | Admitting: Specialist

## 2021-02-04 VITALS — BP 144/83 | HR 71 | Ht 75.0 in | Wt 199.6 lb

## 2021-02-04 DIAGNOSIS — M96842 Postprocedural seroma of a musculoskeletal structure following a musculoskeletal system procedure: Secondary | ICD-10-CM

## 2021-02-04 DIAGNOSIS — Z9889 Other specified postprocedural states: Secondary | ICD-10-CM

## 2021-02-04 NOTE — Progress Notes (Signed)
Post-Op Visit Note   Patient: Antonio Oliver           Date of Birth: April 30, 1956           MRN: [id number] Visit Date: 02/04/2021 PCP: Cher Nakai, MD   Assessment & Plan:POD #27 Central laminectomy L2-3 and L3-4 for severe stenosis above 4 level lumbar fusion Seroma at the incision site.   Chief Complaint:  Chief Complaint  Patient presents with  . Lower Back - Routine Post Op    Doing right much better  Motor both legs still with some weakness though with bilat foot DF and PF. SLR is negative. Mowed yard yesterday with riding mower PT with HHN is eval today for discharge.  May consider out pt PT.  Visit Diagnoses: No diagnosis found.  Plan: No lifting greater than 10 lbs. Avoid bending, stooping and twisting. Walk in house for first week them may start to get out slowly increasing distance up to one  Quarter mile by 8 weeks post op.   Follow-Up Instructions: No follow-ups on file.   Orders:  No orders of the defined types were placed in this encounter.  No orders of the defined types were placed in this encounter.   Imaging: No results found.  PMFS History: Patient Active Problem List   Diagnosis Date Noted  . Lumbar stenosis with neurogenic claudication 01/08/2021  . Back pain 01/05/2021  . BPH (benign prostatic hyperplasia)   . Depression   . Hypertension   . Presence of permanent cardiac pacemaker 12/28/2019  . Ascending aorta dilation ([place]) 03/06/2019  . Charcot's joint of foot 03/05/2019  . Fracture of tibia 03/05/2019  . Fracture of capitate bone of wrist 02/26/2019  . Syncope 02/14/2019  . Bradycardia 02/01/2019  . Weakness   . Leukocytosis   . Nondisplaced fracture of medial cuneiform of left foot, initial encounter for closed fracture 11/16/2018  . Cervical myelopathy ([place]) 11/14/2018  . Hyperlipidemia 10/30/2018  . Vitamin D deficiency 10/30/2018  . Osteoarthritis 10/30/2018  . GERD (gastroesophageal reflux disease) 10/30/2018  . Fatigue  10/30/2018  . Hypothyroidism 10/30/2018  . Essential hypertension 10/30/2018  . Mixed dyslipidemia 10/30/2018  . Hemangioma of skin and subcutaneous tissue 07/07/2017  . Lentigo 07/07/2017  . Multiple actinic keratoses 07/07/2017  . Multiple benign melanocytic nevi 07/07/2017  . Complete heart block ([place]) 05/04/2017  . S/P AVR (aortic valve replacement) 03/10/2017  . Bicuspid aortic valve 12/22/2016  . Benign prostatic hyperplasia without lower urinary tract symptoms 11/17/2016  . Chronic inflammatory arthritis 11/17/2016  . Chronic bilateral low back pain 11/17/2016  . Heart murmur 11/17/2016  . History of kidney stones 11/17/2016  . Migraine 11/17/2016  . Anxiety 06/20/2013  . Enthesopathy of ankle and tarsus 04/04/2013  . Lesion of plantar nerve 04/04/2013  . Personal history of tobacco use, presenting hazards to health 04/04/2013  . Strain of rotator cuff 04/04/2013  . Neck pain 04/04/2013  . Status post bariatric surgery 04/04/2013  . DM2 (diabetes mellitus, type 2) ([place]) 04/03/2013  . Chronic back pain 04/03/2013  . Glaucoma 04/03/2013  . Morbid obesity ([place]) 10/27/2011   Past Medical History:  Diagnosis Date  . Anxiety 10/30/2018  . Ascending aorta dilation ([place]) 03/06/2019  . Benign prostatic hyperplasia without lower urinary tract symptoms 11/17/2016   Last Assessment & Plan:  Stable PSA today.  Formatting of this note might be different from the original. Last Assessment & Plan:  Stable PSA today. Last Assessment & Plan:  Formatting  of this note might be different from the original. Stable PSA today.  . Bicuspid aortic valve 12/22/2016  . BPH (benign prostatic hyperplasia)   . Bradycardia 02/01/2019  . Cervical myelopathy ([place]) 11/14/2018  . Charcot's joint of foot 03/05/2019  . Chronic back pain 04/03/2013  . Chronic bilateral low back pain 11/17/2016  . Chronic inflammatory arthritis 11/17/2016   History of positive rheumatoid factor in the past. Denies placement on  immunosuppressive therapy  . Clinical depression 04/03/2013   Last Assessment & Plan:  Formatting of this note might be different from the original. Depression is unchanged.  Discussed sleep hygiene. Medication changes per orders. Depression will be reassessed in 3 months Has been having drowsiness since taking clonazepam. Wishes to try Buspar daily. Discussed non-pharmacologic methods to reduce anxiety/depression such as working in [employer] and relaxation  . Compartment syndrome ([place]) 06/20/2013   Last Assessment & Plan:  Denies any problems with right leg since April/May Does have swelling at times if he is on this leg for long periods of time. Wears compression socks regularly and has helped with swelling.  . Complete heart block ([place]) 05/04/2017  . Depression   . Diabetes ([place]) 10/30/2018  . Diabetes mellitus due to underlying condition with unspecified complications ([place]) 7/37/1062  . Diabetes mellitus type 2 in nonobese (HCC)   . DM2 (diabetes mellitus, type 2) ([place]) 04/03/2013   Last Assessment & Plan:  Formatting of this note might be different from the original. Diabetes is unchanged.  Continue current treatment regimen. Regular aerobic exercise. Diabetes will be reassessed in 3 months. Will check A1c today. Denies any problems with feet or sensation.  . Enthesopathy of ankle and tarsus 04/04/2013   Last Assessment & Plan:  Continues with swelling after prolonged standing.  Still  Wearing support hose, which helps.  . Essential hypertension 10/30/2018  . Fatigue 10/30/2018  . FHx: migraine headaches 04/04/2013  . Fracture of capitate bone of wrist 02/26/2019  . Fracture of tibia 03/05/2019  . GERD (gastroesophageal reflux disease) 10/30/2018  . Glaucoma 04/03/2013  . HA (headache) 04/03/2013   Last Assessment & Plan:  Formatting of this note might be different from the original. Infrequent, however still having them.  Not sure of the triggers.  Using zomig, which helps immediately.  [facility] Heart  murmur 11/17/2016  . Hemangioma of skin and subcutaneous tissue 07/07/2017  . History of bariatric surgery 04/04/2013  . History of kidney stones 11/17/2016  . Hyperlipidemia 10/30/2018  . Hypertension   . Hypothyroidism 10/30/2018  . Kidney stone 02/01/2019   Last Assessment & Plan:  Renal condition is unchanged. Continue current medications. Renal condition will be reassessed in 3 months. Discussed increasing fluids, and possibility of pain if stone moves or causes obstruction. Told to call urologist or make appointment if problem worsens.  Last Assessment & Plan:  Formatting of this note might be different from the original. Renal condition is unchan  . Lentigo 07/07/2017  . Lesion of plantar nerve 04/04/2013  . Leukocytosis   . Migraine 11/17/2016  . Mixed dyslipidemia 10/30/2018  . Morbid obesity ([place]) 10/27/2011  . Multiple actinic keratoses 07/07/2017  . Multiple benign melanocytic nevi 07/07/2017  . Neck pain 04/04/2013  . Neoplasm of uncertain behavior of skin 07/28/2018  . Nondisplaced fracture of medial cuneiform of left foot, initial encounter for closed fracture 11/16/2018  . Nonrheumatic aortic valve stenosis 02/01/2019  . Osteoarthritis 10/30/2018  . Other seborrheic keratosis 07/07/2017  . Personal history of tobacco  use, presenting hazards to health 04/04/2013  . Presence of permanent cardiac pacemaker   . S/P AVR (aortic valve replacement) 03/10/2017  . Severe single current episode of major depressive disorder, without psychotic features ([place]) 11/17/2016  . Status post bariatric surgery 04/04/2013  . Strain of rotator cuff 04/04/2013  . Surgery, elective   . Syncope 02/14/2019  . Vitamin D deficiency 10/30/2018  . Weakness     Family History  Problem Relation Age of Onset  . Cancer Mother        breast  . Rheum arthritis Mother   . Diabetes Mother   . Heart disease Father   . Kidney disease Father   . Diabetes Father   . Hypertension Brother   . Diabetes Brother   .  Osteoarthritis Brother   . Heart disease Brother   . Hypertension Brother   . Diabetes Brother   . Rheum arthritis Brother   . Depression Daughter   . Anxiety disorder Daughter     Past Surgical History:  Procedure Laterality Date  . ANTERIOR CERVICAL DECOMP/DISCECTOMY FUSION N/A 11/17/2018   Procedure: CERVICAL THREE-CERVICAL FOUR, CERVICAL FOUR-CERVICAL FIVE, CERVICAL FIVE-CERVICAL SIX ANTERIOR CERVICAL DECOMPRESSION/DISCECTOMY FUSION;  Surgeon: Earnie Larsson, MD;  Location: [facility];  Service: Neurosurgery;  Laterality: N/A;  . APPLICATION OF WOUND VAC Left 04/05/2019   Procedure: Application Of Wound Vac;  Surgeon: Erle Crocker, MD;  Location: [facility];  Service: Orthopedics;  Laterality: Left;  . BACK SURGERY     x4  . FASCIOTOMY Left 04/05/2019   Left leg 2 compartment fasciotomy  . FASCIOTOMY Left 04/05/2019   Procedure: FASCIOTOMY LEFT LOWER LEG;  Surgeon: Erle Crocker, MD;  Location: [facility];  Service: Orthopedics;  Laterality: Left;  . FOOT NEUROMA SURGERY    . GASTRIC BYPASS    . HEMATOMA EVACUATION Left 04/05/2019   Procedure: Evacuation Hematoma Left Lower Leg;  Surgeon: Erle Crocker, MD;  Location: [facility];  Service: Orthopedics;  Laterality: Left;  . HEMORRHOID SURGERY     over 30 years ago  . HERNIA REPAIR     [facility] umb  . LUMBAR LAMINECTOMY/DECOMPRESSION MICRODISCECTOMY N/A 01/08/2021   Procedure: CENTRAL LUMBAR LAMINECTOMY LUMBAR TWO-THREE, LUMBAR THREE-FOUR;  Surgeon: Jessy Oto, MD;  Location: [facility];  Service: Orthopedics;  Laterality: N/A;  . NASAL SINUS SURGERY     x2  . NECK SURGERY     Social History   Occupational History  . Not on file  Tobacco Use  . Smoking status: Never Smoker  . Smokeless tobacco: Never Used  Vaping Use  . Vaping Use: Never used  Substance and Sexual Activity  . Alcohol use: Yes    Alcohol/week: 1.0 standard drink    Types: 1 Glasses of wine per week    Comment: occ.  . Drug use: No  . Sexual activity: Not on file

## 2021-02-04 NOTE — Patient Instructions (Signed)
    No lifting greater than 10 lbs. Avoid bending, stooping and twisting. Walk in house for first week them may start to get out slowly increasing distance up to one  Quarter mile by 8 weeks post op.

## 2021-02-05 ENCOUNTER — Other Ambulatory Visit: Payer: Self-pay

## 2021-02-05 DIAGNOSIS — M48062 Spinal stenosis, lumbar region with neurogenic claudication: Secondary | ICD-10-CM | POA: Diagnosis not present

## 2021-02-05 DIAGNOSIS — E11610 Type 2 diabetes mellitus with diabetic neuropathic arthropathy: Secondary | ICD-10-CM | POA: Diagnosis not present

## 2021-02-05 DIAGNOSIS — I10 Essential (primary) hypertension: Secondary | ICD-10-CM | POA: Diagnosis not present

## 2021-02-05 DIAGNOSIS — F32A Depression, unspecified: Secondary | ICD-10-CM | POA: Diagnosis not present

## 2021-02-05 DIAGNOSIS — I442 Atrioventricular block, complete: Secondary | ICD-10-CM | POA: Diagnosis not present

## 2021-02-05 DIAGNOSIS — M1990 Unspecified osteoarthritis, unspecified site: Secondary | ICD-10-CM | POA: Diagnosis not present

## 2021-02-05 DIAGNOSIS — F419 Anxiety disorder, unspecified: Secondary | ICD-10-CM | POA: Diagnosis not present

## 2021-02-05 DIAGNOSIS — K219 Gastro-esophageal reflux disease without esophagitis: Secondary | ICD-10-CM | POA: Diagnosis not present

## 2021-02-05 DIAGNOSIS — Z4789 Encounter for other orthopedic aftercare: Secondary | ICD-10-CM | POA: Diagnosis not present

## 2021-02-05 MED ORDER — OXYCODONE HCL 5 MG PO CAPS: 5.0000 mg | ORAL_CAPSULE | ORAL | 0 refills | Status: DC | PRN

## 2021-02-05 NOTE — Addendum Note (Signed)
Addended by: Minda Ditto, Geoffery Spruce on: 02/05/2021 09:39 AM   Modules accepted: Orders

## 2021-02-05 NOTE — Telephone Encounter (Signed)
Patients wife called she is requesting rx refill for oxycodone call [phone]

## 2021-02-11 DIAGNOSIS — I442 Atrioventricular block, complete: Secondary | ICD-10-CM | POA: Diagnosis not present

## 2021-02-11 DIAGNOSIS — E11610 Type 2 diabetes mellitus with diabetic neuropathic arthropathy: Secondary | ICD-10-CM | POA: Diagnosis not present

## 2021-02-11 DIAGNOSIS — K219 Gastro-esophageal reflux disease without esophagitis: Secondary | ICD-10-CM | POA: Diagnosis not present

## 2021-02-11 DIAGNOSIS — Z4789 Encounter for other orthopedic aftercare: Secondary | ICD-10-CM | POA: Diagnosis not present

## 2021-02-11 DIAGNOSIS — M48062 Spinal stenosis, lumbar region with neurogenic claudication: Secondary | ICD-10-CM | POA: Diagnosis not present

## 2021-02-11 DIAGNOSIS — F419 Anxiety disorder, unspecified: Secondary | ICD-10-CM | POA: Diagnosis not present

## 2021-02-11 DIAGNOSIS — M1990 Unspecified osteoarthritis, unspecified site: Secondary | ICD-10-CM | POA: Diagnosis not present

## 2021-02-11 DIAGNOSIS — F32A Depression, unspecified: Secondary | ICD-10-CM | POA: Diagnosis not present

## 2021-02-11 DIAGNOSIS — I10 Essential (primary) hypertension: Secondary | ICD-10-CM | POA: Diagnosis not present

## 2021-02-13 DIAGNOSIS — E1121 Type 2 diabetes mellitus with diabetic nephropathy: Secondary | ICD-10-CM | POA: Diagnosis not present

## 2021-02-13 DIAGNOSIS — E291 Testicular hypofunction: Secondary | ICD-10-CM | POA: Diagnosis not present

## 2021-02-13 DIAGNOSIS — Z794 Long term (current) use of insulin: Secondary | ICD-10-CM | POA: Diagnosis not present

## 2021-02-13 DIAGNOSIS — M159 Polyosteoarthritis, unspecified: Secondary | ICD-10-CM | POA: Diagnosis not present

## 2021-02-13 DIAGNOSIS — N400 Enlarged prostate without lower urinary tract symptoms: Secondary | ICD-10-CM | POA: Diagnosis not present

## 2021-02-13 DIAGNOSIS — L219 Seborrheic dermatitis, unspecified: Secondary | ICD-10-CM | POA: Diagnosis not present

## 2021-02-13 DIAGNOSIS — I10 Essential (primary) hypertension: Secondary | ICD-10-CM | POA: Diagnosis not present

## 2021-02-13 DIAGNOSIS — E785 Hyperlipidemia, unspecified: Secondary | ICD-10-CM | POA: Diagnosis not present

## 2021-02-13 DIAGNOSIS — N3281 Overactive bladder: Secondary | ICD-10-CM | POA: Diagnosis not present

## 2021-02-18 ENCOUNTER — Other Ambulatory Visit: Payer: Self-pay

## 2021-02-18 ENCOUNTER — Encounter: Payer: Self-pay | Admitting: Specialist

## 2021-02-18 ENCOUNTER — Ambulatory Visit (INDEPENDENT_AMBULATORY_CARE_PROVIDER_SITE_OTHER): Payer: Medicare PPO | Admitting: Specialist

## 2021-02-18 VITALS — BP 113/74 | HR 67 | Ht 75.0 in | Wt 199.8 lb

## 2021-02-18 DIAGNOSIS — M4326 Fusion of spine, lumbar region: Secondary | ICD-10-CM

## 2021-02-18 DIAGNOSIS — M96842 Postprocedural seroma of a musculoskeletal structure following a musculoskeletal system procedure: Secondary | ICD-10-CM

## 2021-02-18 DIAGNOSIS — M5416 Radiculopathy, lumbar region: Secondary | ICD-10-CM

## 2021-02-18 DIAGNOSIS — Z9889 Other specified postprocedural states: Secondary | ICD-10-CM

## 2021-02-18 NOTE — Progress Notes (Signed)
Post-Op Visit Note   Patient: Antonio Oliver           Date of Birth: [birth year]           MRN: [id number] Visit Date: 02/18/2021 PCP: Cher Nakai, MD   Assessment & Plan: 6 Weeks Post op lumbar central laminectomy L1-2 and L2-3 above L3 to S1 fusion  Chief Complaint:  Chief Complaint  Patient presents with   Lower Back - Routine Post Op  Incision is healed, swelling and accumulation of seroma is markedly decreased. He still is painful though over the right buttock and has pain into the back and right buttock more than the leg. He is still using a wheel chair. Motor in both legs is normal.  Visit Diagnoses:  1. Status post lumbar laminectomy   2. Lumbar radiculopathy   3. Fusion of lumbar spine   4. Seroma of musculoskeletal structure after musculoskeletal system procedure     Plan: Avoid frequent bending and stooping  No lifting greater than 10 lbs. May use ice or moist heat for pain. Weight loss is of benefit. Best medication for lumbar disc disease is arthritis medications like motrin, celebrex and naprosyn. Exercise is important to improve your indurance and does allow people to function better inspite of back pain. Will refer you to [facility] for core strengthening and LE strengthening.  Presently taking oxycodone TID 10/325 1/2 every 3 hours.  Due to shoulder and neck pain as well.    Follow-Up Instructions: No follow-ups on file.   Orders:  No orders of the defined types were placed in this encounter.  No orders of the defined types were placed in this encounter.   Imaging: No results found.  PMFS History: Patient Active Problem List   Diagnosis Date Noted   Lumbar stenosis with neurogenic claudication 01/08/2021   Back pain 01/05/2021   BPH (benign prostatic hyperplasia)    Depression    Hypertension    Presence of permanent cardiac pacemaker 12/28/2019   Ascending aorta dilation ([place]) 03/06/2019   Charcot's joint of foot 03/05/2019   Fracture of  tibia 03/05/2019   Fracture of capitate bone of wrist 02/26/2019   Syncope 02/14/2019   Bradycardia 02/01/2019   Weakness    Leukocytosis    Nondisplaced fracture of medial cuneiform of left foot, initial encounter for closed fracture 11/16/2018   Cervical myelopathy ([place]) 11/14/2018   Hyperlipidemia 10/30/2018   Vitamin D deficiency 10/30/2018   Osteoarthritis 10/30/2018   GERD (gastroesophageal reflux disease) 10/30/2018   Fatigue 10/30/2018   Hypothyroidism 10/30/2018   Essential hypertension 10/30/2018   Mixed dyslipidemia 10/30/2018   Hemangioma of skin and subcutaneous tissue 07/07/2017   Lentigo 07/07/2017   Multiple actinic keratoses 07/07/2017   Multiple benign melanocytic nevi 07/07/2017   Complete heart block ([place]) 05/04/2017   S/P AVR (aortic valve replacement) 03/10/2017   Bicuspid aortic valve 12/22/2016   Benign prostatic hyperplasia without lower urinary tract symptoms 11/17/2016   Chronic inflammatory arthritis 11/17/2016   Chronic bilateral low back pain 11/17/2016   Heart murmur 11/17/2016   History of kidney stones 11/17/2016   Migraine 11/17/2016   Anxiety 06/20/2013   Enthesopathy of ankle and tarsus 04/04/2013   Lesion of plantar nerve 04/04/2013   Personal history of tobacco use, presenting hazards to health 04/04/2013   Strain of rotator cuff 04/04/2013   Neck pain 04/04/2013   Status post bariatric surgery 04/04/2013   DM2 (diabetes mellitus, type 2) ([place]) 04/03/2013  Chronic back pain 04/03/2013   Glaucoma 04/03/2013   Morbid obesity ([place]) 10/27/2011   Past Medical History:  Diagnosis Date   Anxiety 10/30/2018   Ascending aorta dilation ([place]) 03/06/2019   Benign prostatic hyperplasia without lower urinary tract symptoms 11/17/2016   Last Assessment & Plan:  Stable PSA today.  Formatting of this note might be different from the original. Last Assessment & Plan:  Stable PSA today. Last Assessment & Plan:  Formatting of this note might be different  from the original. Stable PSA today.   Bicuspid aortic valve 12/22/2016   BPH (benign prostatic hyperplasia)    Bradycardia 02/01/2019   Cervical myelopathy ([place]) 11/14/2018   Charcot's joint of foot 03/05/2019   Chronic back pain 04/03/2013   Chronic bilateral low back pain 11/17/2016   Chronic inflammatory arthritis 11/17/2016   History of positive rheumatoid factor in the past. Denies placement on immunosuppressive therapy   Clinical depression 04/03/2013   Last Assessment & Plan:  Formatting of this note might be different from the original. Depression is unchanged.  Discussed sleep hygiene. Medication changes per orders. Depression will be reassessed in 3 months Has been having drowsiness since taking clonazepam. Wishes to try Buspar daily. Discussed non-pharmacologic methods to reduce anxiety/depression such as working in [employer] and relaxation   Compartment syndrome ([place]) 06/20/2013   Last Assessment & Plan:  Denies any problems with right leg since April/May Does have swelling at times if he is on this leg for long periods of time. Wears compression socks regularly and has helped with swelling.   Complete heart block ([place]) 05/04/2017   Depression    Diabetes ([place]) 10/30/2018   Diabetes mellitus due to underlying condition with unspecified complications ([place]) 0/62/3762   Diabetes mellitus type 2 in nonobese (HCC)    DM2 (diabetes mellitus, type 2) ([place]) 04/03/2013   Last Assessment & Plan:  Formatting of this note might be different from the original. Diabetes is unchanged.  Continue current treatment regimen. Regular aerobic exercise. Diabetes will be reassessed in 3 months. Will check A1c today. Denies any problems with feet or sensation.   Enthesopathy of ankle and tarsus 04/04/2013   Last Assessment & Plan:  Continues with swelling after prolonged standing.  Still  Wearing support hose, which helps.   Essential hypertension 10/30/2018   Fatigue 10/30/2018   FHx: migraine headaches 04/04/2013    Fracture of capitate bone of wrist 02/26/2019   Fracture of tibia 03/05/2019   GERD (gastroesophageal reflux disease) 10/30/2018   Glaucoma 04/03/2013   HA (headache) 04/03/2013   Last Assessment & Plan:  Formatting of this note might be different from the original. Infrequent, however still having them.  Not sure of the triggers.  Using zomig, which helps immediately.   Heart murmur 11/17/2016   Hemangioma of skin and subcutaneous tissue 07/07/2017   History of bariatric surgery 04/04/2013   History of kidney stones 11/17/2016   Hyperlipidemia 10/30/2018   Hypertension    Hypothyroidism 10/30/2018   Kidney stone 02/01/2019   Last Assessment & Plan:  Renal condition is unchanged. Continue current medications. Renal condition will be reassessed in 3 months. Discussed increasing fluids, and possibility of pain if stone moves or causes obstruction. Told to call urologist or make appointment if problem worsens.  Last Assessment & Plan:  Formatting of this note might be different from the original. Renal condition is unchan   Lentigo 07/07/2017   Lesion of plantar nerve 04/04/2013   Leukocytosis  Migraine 11/17/2016   Mixed dyslipidemia 10/30/2018   Morbid obesity ([place]) 10/27/2011   Multiple actinic keratoses 07/07/2017   Multiple benign melanocytic nevi 07/07/2017   Neck pain 04/04/2013   Neoplasm of uncertain behavior of skin 07/28/2018   Nondisplaced fracture of medial cuneiform of left foot, initial encounter for closed fracture 11/16/2018   Nonrheumatic aortic valve stenosis 02/01/2019   Osteoarthritis 10/30/2018   Other seborrheic keratosis 07/07/2017   Personal history of tobacco use, presenting hazards to health 04/04/2013   Presence of permanent cardiac pacemaker    S/P AVR (aortic valve replacement) 03/10/2017   Severe single current episode of major depressive disorder, without psychotic features ([place]) 11/17/2016   Status post bariatric surgery 04/04/2013   Strain of rotator cuff 04/04/2013    Surgery, elective    Syncope 02/14/2019   Vitamin D deficiency 10/30/2018   Weakness     Family History  Problem Relation Age of Onset   Cancer Mother        breast   Rheum arthritis Mother    Diabetes Mother    Heart disease Father    Kidney disease Father    Diabetes Father    Hypertension Brother    Diabetes Brother    Osteoarthritis Brother    Heart disease Brother    Hypertension Brother    Diabetes Brother    Rheum arthritis Brother    Depression Daughter    Anxiety disorder Daughter     Past Surgical History:  Procedure Laterality Date   ANTERIOR CERVICAL DECOMP/DISCECTOMY FUSION N/A 11/17/2018   Procedure: CERVICAL THREE-CERVICAL FOUR, CERVICAL FOUR-CERVICAL FIVE, CERVICAL FIVE-CERVICAL SIX ANTERIOR CERVICAL DECOMPRESSION/DISCECTOMY FUSION;  Surgeon: Earnie Larsson, MD;  Location: [facility];  Service: Neurosurgery;  Laterality: N/A;   APPLICATION OF WOUND VAC Left 04/05/2019   Procedure: Application Of Wound Vac;  Surgeon: Erle Crocker, MD;  Location: [facility];  Service: Orthopedics;  Laterality: Left;   BACK SURGERY     x4   FASCIOTOMY Left 04/05/2019   Left leg 2 compartment fasciotomy   FASCIOTOMY Left 04/05/2019   Procedure: FASCIOTOMY LEFT LOWER LEG;  Surgeon: Erle Crocker, MD;  Location: [facility];  Service: Orthopedics;  Laterality: Left;   FOOT NEUROMA SURGERY     GASTRIC BYPASS     HEMATOMA EVACUATION Left 04/05/2019   Procedure: Evacuation Hematoma Left Lower Leg;  Surgeon: Erle Crocker, MD;  Location: [facility];  Service: Orthopedics;  Laterality: Left;   HEMORRHOID SURGERY     over 30 years ago   [place] umb   LUMBAR LAMINECTOMY/DECOMPRESSION MICRODISCECTOMY N/A 01/08/2021   Procedure: CENTRAL LUMBAR LAMINECTOMY LUMBAR TWO-THREE, LUMBAR THREE-FOUR;  Surgeon: Jessy Oto, MD;  Location: [facility];  Service: Orthopedics;  Laterality: N/A;   NASAL SINUS SURGERY     x2   NECK SURGERY     Social History   Occupational History   Not on  file  Tobacco Use   Smoking status: Never   Smokeless tobacco: Never  Vaping Use   Vaping Use: Never used  Substance and Sexual Activity   Alcohol use: Yes    Alcohol/week: 1.0 standard drink    Types: 1 Glasses of wine per week    Comment: occ.   Drug use: No   Sexual activity: Not on file

## 2021-02-18 NOTE — Patient Instructions (Signed)
Plan: Avoid frequent bending and stooping  No lifting greater than 10 lbs. May use ice or moist heat for pain. Weight loss is of benefit. Best medication for lumbar disc disease is arthritis medications like motrin, celebrex and naprosyn. Exercise is important to improve your indurance and does allow people to function better inspite of back pain. Will refer you to [facility] for core strengthening and LE strengthening.  Presently taking oxycodone TID 10/325 1/2 every 3 hours.  Due to shoulder and neck pain as well.

## 2021-02-20 DIAGNOSIS — M659 Synovitis and tenosynovitis, unspecified: Secondary | ICD-10-CM | POA: Diagnosis not present

## 2021-02-20 DIAGNOSIS — N400 Enlarged prostate without lower urinary tract symptoms: Secondary | ICD-10-CM | POA: Diagnosis not present

## 2021-02-20 DIAGNOSIS — I10 Essential (primary) hypertension: Secondary | ICD-10-CM | POA: Diagnosis not present

## 2021-02-20 DIAGNOSIS — N3281 Overactive bladder: Secondary | ICD-10-CM | POA: Diagnosis not present

## 2021-02-20 DIAGNOSIS — Z794 Long term (current) use of insulin: Secondary | ICD-10-CM | POA: Diagnosis not present

## 2021-02-20 DIAGNOSIS — E291 Testicular hypofunction: Secondary | ICD-10-CM | POA: Diagnosis not present

## 2021-02-20 DIAGNOSIS — E785 Hyperlipidemia, unspecified: Secondary | ICD-10-CM | POA: Diagnosis not present

## 2021-02-20 DIAGNOSIS — E1121 Type 2 diabetes mellitus with diabetic nephropathy: Secondary | ICD-10-CM | POA: Diagnosis not present

## 2021-02-20 DIAGNOSIS — M159 Polyosteoarthritis, unspecified: Secondary | ICD-10-CM | POA: Diagnosis not present

## 2021-03-13 DIAGNOSIS — M159 Polyosteoarthritis, unspecified: Secondary | ICD-10-CM | POA: Diagnosis not present

## 2021-03-13 DIAGNOSIS — I10 Essential (primary) hypertension: Secondary | ICD-10-CM | POA: Diagnosis not present

## 2021-03-13 DIAGNOSIS — N3281 Overactive bladder: Secondary | ICD-10-CM | POA: Diagnosis not present

## 2021-03-13 DIAGNOSIS — Z794 Long term (current) use of insulin: Secondary | ICD-10-CM | POA: Diagnosis not present

## 2021-03-13 DIAGNOSIS — E785 Hyperlipidemia, unspecified: Secondary | ICD-10-CM | POA: Diagnosis not present

## 2021-03-13 DIAGNOSIS — N400 Enlarged prostate without lower urinary tract symptoms: Secondary | ICD-10-CM | POA: Diagnosis not present

## 2021-03-13 DIAGNOSIS — M659 Synovitis and tenosynovitis, unspecified: Secondary | ICD-10-CM | POA: Diagnosis not present

## 2021-03-13 DIAGNOSIS — E1121 Type 2 diabetes mellitus with diabetic nephropathy: Secondary | ICD-10-CM | POA: Diagnosis not present

## 2021-03-13 DIAGNOSIS — E291 Testicular hypofunction: Secondary | ICD-10-CM | POA: Diagnosis not present

## 2021-03-16 ENCOUNTER — Encounter: Payer: Medicare PPO | Admitting: Cardiology

## 2021-04-01 DIAGNOSIS — D225 Melanocytic nevi of trunk: Secondary | ICD-10-CM | POA: Diagnosis not present

## 2021-04-01 DIAGNOSIS — R233 Spontaneous ecchymoses: Secondary | ICD-10-CM | POA: Diagnosis not present

## 2021-04-01 DIAGNOSIS — D2239 Melanocytic nevi of other parts of face: Secondary | ICD-10-CM | POA: Diagnosis not present

## 2021-04-01 DIAGNOSIS — D485 Neoplasm of uncertain behavior of skin: Secondary | ICD-10-CM | POA: Diagnosis not present

## 2021-04-01 DIAGNOSIS — L570 Actinic keratosis: Secondary | ICD-10-CM | POA: Diagnosis not present

## 2021-04-02 ENCOUNTER — Encounter: Payer: Self-pay | Admitting: Specialist

## 2021-04-02 ENCOUNTER — Other Ambulatory Visit: Payer: Self-pay

## 2021-04-02 ENCOUNTER — Ambulatory Visit (INDEPENDENT_AMBULATORY_CARE_PROVIDER_SITE_OTHER): Payer: Medicare PPO | Admitting: Specialist

## 2021-04-02 VITALS — BP 162/104 | HR 73 | Ht 75.0 in | Wt 200.0 lb

## 2021-04-02 DIAGNOSIS — E538 Deficiency of other specified B group vitamins: Secondary | ICD-10-CM

## 2021-04-02 DIAGNOSIS — R6889 Other general symptoms and signs: Secondary | ICD-10-CM | POA: Diagnosis not present

## 2021-04-02 DIAGNOSIS — R58 Hemorrhage, not elsewhere classified: Secondary | ICD-10-CM

## 2021-04-02 DIAGNOSIS — M5416 Radiculopathy, lumbar region: Secondary | ICD-10-CM

## 2021-04-02 DIAGNOSIS — E559 Vitamin D deficiency, unspecified: Secondary | ICD-10-CM | POA: Diagnosis not present

## 2021-04-02 DIAGNOSIS — M545 Low back pain, unspecified: Secondary | ICD-10-CM

## 2021-04-02 DIAGNOSIS — M96842 Postprocedural seroma of a musculoskeletal structure following a musculoskeletal system procedure: Secondary | ICD-10-CM

## 2021-04-02 DIAGNOSIS — Z9889 Other specified postprocedural states: Secondary | ICD-10-CM

## 2021-04-02 DIAGNOSIS — Z952 Presence of prosthetic heart valve: Secondary | ICD-10-CM

## 2021-04-02 NOTE — Progress Notes (Signed)
Post-Op Visit Note   Patient: Antonio Oliver           Date of Birth: 11/29/1955           MRN: [id number] Visit Date: 04/02/2021 PCP: Cher Nakai, MD   Assessment & Plan:3 months post central laminectomies L2-3 and L3-4  Chief Complaint:  Chief Complaint  Patient presents with   Lower Back - Routine Post Op    12 wks, post Central Laminectomy L2-3 & L3-4  Skin is thin and there is bilateral arm Ecchymosis through out, reports that he had a fall. He is seen in office, has returned to driving and his wife is not with him. Previously he was confined to a wheel chair now he Able to stand and walk Baseline torticollis, head and neck rotated to the right side. Upper extremity motor is normal. Previous C6-7 ACDF then 3 level ACDF C3-4, C4-5 and C5-6 for stenosis and bilateral leg weaks Motor in the legs is improves.  Visit Diagnoses: No diagnosis found.  Plan:He is having bleeding and echymosis that can be due to zinc and vitamin deficiencies, recommend testing for vitamin and zinc deficiency. He is taking aspirin but the degree of ecchmosis suggests that the anticoagulation associated with the aspirin may be a concern.  Check Vit c and Zinc levels,   Follow-Up Instructions: No follow-ups on file.   Orders:  No orders of the defined types were placed in this encounter.  No orders of the defined types were placed in this encounter.   Imaging: No results found.  PMFS History: Patient Active Problem List   Diagnosis Date Noted   Lumbar stenosis with neurogenic claudication 01/08/2021   Back pain 01/05/2021   BPH (benign prostatic hyperplasia)    Depression    Hypertension    Presence of permanent cardiac pacemaker 12/28/2019   Ascending aorta dilation ([place]) 03/06/2019   Charcot's joint of foot 03/05/2019   Fracture of tibia 03/05/2019   Fracture of capitate bone of wrist 02/26/2019   Syncope 02/14/2019   Bradycardia 02/01/2019   Weakness    Leukocytosis    Nondisplaced  fracture of medial cuneiform of left foot, initial encounter for closed fracture 11/16/2018   Cervical myelopathy ([place]) 11/14/2018   Hyperlipidemia 10/30/2018   Vitamin D deficiency 10/30/2018   Osteoarthritis 10/30/2018   GERD (gastroesophageal reflux disease) 10/30/2018   Fatigue 10/30/2018   Hypothyroidism 10/30/2018   Essential hypertension 10/30/2018   Mixed dyslipidemia 10/30/2018   Hemangioma of skin and subcutaneous tissue 07/07/2017   Lentigo 07/07/2017   Multiple actinic keratoses 07/07/2017   Multiple benign melanocytic nevi 07/07/2017   Complete heart block ([place]) 05/04/2017   S/P AVR (aortic valve replacement) 03/10/2017   Bicuspid aortic valve 12/22/2016   Benign prostatic hyperplasia without lower urinary tract symptoms 11/17/2016   Chronic inflammatory arthritis 11/17/2016   Chronic bilateral low back pain 11/17/2016   Heart murmur 11/17/2016   History of kidney stones 11/17/2016   Migraine 11/17/2016   Anxiety 06/20/2013   Enthesopathy of ankle and tarsus 04/04/2013   Lesion of plantar nerve 04/04/2013   Personal history of tobacco use, presenting hazards to health 04/04/2013   Strain of rotator cuff 04/04/2013   Neck pain 04/04/2013   Status post bariatric surgery 04/04/2013   DM2 (diabetes mellitus, type 2) ([place]) 04/03/2013   Chronic back pain 04/03/2013   Glaucoma 04/03/2013   Morbid obesity ([place]) 10/27/2011   Past Medical History:  Diagnosis Date   Anxiety 10/30/2018  Ascending aorta dilation ([place]) 03/06/2019   Benign prostatic hyperplasia without lower urinary tract symptoms 11/17/2016   Last Assessment & Plan:  Stable PSA today.  Formatting of this note might be different from the original. Last Assessment & Plan:  Stable PSA today. Last Assessment & Plan:  Formatting of this note might be different from the original. Stable PSA today.   Bicuspid aortic valve 12/22/2016   BPH (benign prostatic hyperplasia)    Bradycardia 02/01/2019   Cervical myelopathy  ([place]) 11/14/2018   Charcot's joint of foot 03/05/2019   Chronic back pain 04/03/2013   Chronic bilateral low back pain 11/17/2016   Chronic inflammatory arthritis 11/17/2016   History of positive rheumatoid factor in the past. Denies placement on immunosuppressive therapy   Clinical depression 04/03/2013   Last Assessment & Plan:  Formatting of this note might be different from the original. Depression is unchanged.  Discussed sleep hygiene. Medication changes per orders. Depression will be reassessed in 3 months Has been having drowsiness since taking clonazepam. Wishes to try Buspar daily. Discussed non-pharmacologic methods to reduce anxiety/depression such as working in [employer] and relaxation   Compartment syndrome ([place]) 06/20/2013   Last Assessment & Plan:  Denies any problems with right leg since April/May Does have swelling at times if he is on this leg for long periods of time. Wears compression socks regularly and has helped with swelling.   Complete heart block ([place]) 05/04/2017   Depression    Diabetes ([place]) 10/30/2018   Diabetes mellitus due to underlying condition with unspecified complications ([place]) [id number]   Diabetes mellitus type 2 in nonobese (HCC)    DM2 (diabetes mellitus, type 2) ([place]) 04/03/2013   Last Assessment & Plan:  Formatting of this note might be different from the original. Diabetes is unchanged.  Continue current treatment regimen. Regular aerobic exercise. Diabetes will be reassessed in 3 months. Will check A1c today. Denies any problems with feet or sensation.   Enthesopathy of ankle and tarsus 04/04/2013   Last Assessment & Plan:  Continues with swelling after prolonged standing.  Still  Wearing support hose, which helps.   Essential hypertension 10/30/2018   Fatigue 10/30/2018   FHx: migraine headaches 04/04/2013   Fracture of capitate bone of wrist 02/26/2019   Fracture of tibia 03/05/2019   GERD (gastroesophageal reflux disease) 10/30/2018   Glaucoma 04/03/2013   HA  (headache) 04/03/2013   Last Assessment & Plan:  Formatting of this note might be different from the original. Infrequent, however still having them.  Not sure of the triggers.  Using zomig, which helps immediately.   Heart murmur 11/17/2016   Hemangioma of skin and subcutaneous tissue 07/07/2017   History of bariatric surgery 04/04/2013   History of kidney stones 11/17/2016   Hyperlipidemia 10/30/2018   Hypertension    Hypothyroidism 10/30/2018   Kidney stone 02/01/2019   Last Assessment & Plan:  Renal condition is unchanged. Continue current medications. Renal condition will be reassessed in 3 months. Discussed increasing fluids, and possibility of pain if stone moves or causes obstruction. Told to call urologist or make appointment if problem worsens.  Last Assessment & Plan:  Formatting of this note might be different from the original. Renal condition is unchan   Lentigo 07/07/2017   Lesion of plantar nerve 04/04/2013   Leukocytosis    Migraine 11/17/2016   Mixed dyslipidemia 10/30/2018   Morbid obesity ([place]) 10/27/2011   Multiple actinic keratoses 07/07/2017   Multiple benign melanocytic nevi 07/07/2017  Neck pain 04/04/2013   Neoplasm of uncertain behavior of skin 07/28/2018   Nondisplaced fracture of medial cuneiform of left foot, initial encounter for closed fracture 11/16/2018   Nonrheumatic aortic valve stenosis 02/01/2019   Osteoarthritis 10/30/2018   Other seborrheic keratosis 07/07/2017   Personal history of tobacco use, presenting hazards to health 04/04/2013   Presence of permanent cardiac pacemaker    S/P AVR (aortic valve replacement) 03/10/2017   Severe single current episode of major depressive disorder, without psychotic features ([place]) 11/17/2016   Status post bariatric surgery 04/04/2013   Strain of rotator cuff 04/04/2013   Surgery, elective    Syncope 02/14/2019   Vitamin D deficiency 10/30/2018   Weakness     Family History  Problem Relation Age of Onset   Cancer Mother         breast   Rheum arthritis Mother    Diabetes Mother    Heart disease Father    Kidney disease Father    Diabetes Father    Hypertension Brother    Diabetes Brother    Osteoarthritis Brother    Heart disease Brother    Hypertension Brother    Diabetes Brother    Rheum arthritis Brother    Depression Daughter    Anxiety disorder Daughter     Past Surgical History:  Procedure Laterality Date   ANTERIOR CERVICAL DECOMP/DISCECTOMY FUSION N/A 11/17/2018   Procedure: CERVICAL THREE-CERVICAL FOUR, CERVICAL FOUR-CERVICAL FIVE, CERVICAL FIVE-CERVICAL SIX ANTERIOR CERVICAL DECOMPRESSION/DISCECTOMY FUSION;  Surgeon: Earnie Larsson, MD;  Location: [facility];  Service: Neurosurgery;  Laterality: N/A;   APPLICATION OF WOUND VAC Left 04/05/2019   Procedure: Application Of Wound Vac;  Surgeon: Erle Crocker, MD;  Location: [facility];  Service: Orthopedics;  Laterality: Left;   BACK SURGERY     x4   FASCIOTOMY Left 04/05/2019   Left leg 2 compartment fasciotomy   FASCIOTOMY Left 04/05/2019   Procedure: FASCIOTOMY LEFT LOWER LEG;  Surgeon: Erle Crocker, MD;  Location: [facility];  Service: Orthopedics;  Laterality: Left;   FOOT NEUROMA SURGERY     GASTRIC BYPASS     HEMATOMA EVACUATION Left 04/05/2019   Procedure: Evacuation Hematoma Left Lower Leg;  Surgeon: Erle Crocker, MD;  Location: [facility];  Service: Orthopedics;  Laterality: Left;   HEMORRHOID SURGERY     over 30 years ago   [place] umb   LUMBAR LAMINECTOMY/DECOMPRESSION MICRODISCECTOMY N/A 01/08/2021   Procedure: CENTRAL LUMBAR LAMINECTOMY LUMBAR TWO-THREE, LUMBAR THREE-FOUR;  Surgeon: Jessy Oto, MD;  Location: [facility];  Service: Orthopedics;  Laterality: N/A;   NASAL SINUS SURGERY     x2   NECK SURGERY     Social History   Occupational History   Not on file  Tobacco Use   Smoking status: Never   Smokeless tobacco: Never  Vaping Use   Vaping Use: Never used  Substance and Sexual Activity   Alcohol use: Yes     Alcohol/week: 1.0 standard drink    Types: 1 Glasses of wine per week    Comment: occ.   Drug use: No   Sexual activity: Not on file

## 2021-04-06 LAB — ZINC: Zinc: 81 ug/dL (ref 60–130)

## 2021-04-06 LAB — B12 AND FOLATE PANEL
Folate: >24 ng/mL
Vitamin B-12: 535 pg/mL (ref 200–1100)

## 2021-04-06 LAB — EXTRA LAV TOP TUBE

## 2021-04-06 LAB — VITAMIN D 25 HYDROXY (VIT D DEFICIENCY, FRACTURES): Vit D, 25-Hydroxy: 80 ng/mL (ref 30–100)

## 2021-04-08 ENCOUNTER — Encounter: Payer: Medicare PPO | Admitting: Student

## 2021-04-13 DIAGNOSIS — N3281 Overactive bladder: Secondary | ICD-10-CM | POA: Diagnosis not present

## 2021-04-13 DIAGNOSIS — M159 Polyosteoarthritis, unspecified: Secondary | ICD-10-CM | POA: Diagnosis not present

## 2021-04-13 DIAGNOSIS — Z794 Long term (current) use of insulin: Secondary | ICD-10-CM | POA: Diagnosis not present

## 2021-04-13 DIAGNOSIS — E785 Hyperlipidemia, unspecified: Secondary | ICD-10-CM | POA: Diagnosis not present

## 2021-04-13 DIAGNOSIS — I10 Essential (primary) hypertension: Secondary | ICD-10-CM | POA: Diagnosis not present

## 2021-04-13 DIAGNOSIS — E1121 Type 2 diabetes mellitus with diabetic nephropathy: Secondary | ICD-10-CM | POA: Diagnosis not present

## 2021-04-13 DIAGNOSIS — M659 Synovitis and tenosynovitis, unspecified: Secondary | ICD-10-CM | POA: Diagnosis not present

## 2021-04-13 DIAGNOSIS — E291 Testicular hypofunction: Secondary | ICD-10-CM | POA: Diagnosis not present

## 2021-04-13 DIAGNOSIS — N400 Enlarged prostate without lower urinary tract symptoms: Secondary | ICD-10-CM | POA: Diagnosis not present

## 2021-04-16 ENCOUNTER — Other Ambulatory Visit: Payer: Self-pay

## 2021-04-16 DIAGNOSIS — E119 Type 2 diabetes mellitus without complications: Secondary | ICD-10-CM | POA: Insufficient documentation

## 2021-04-17 ENCOUNTER — Ambulatory Visit: Payer: Medicare PPO | Admitting: Cardiology

## 2021-04-17 ENCOUNTER — Encounter: Payer: Self-pay | Admitting: Cardiology

## 2021-04-17 ENCOUNTER — Other Ambulatory Visit: Payer: Self-pay

## 2021-04-17 ENCOUNTER — Ambulatory Visit: Payer: Medicare PPO

## 2021-04-17 VITALS — BP 126/88 | HR 65 | Ht 75.0 in | Wt 203.8 lb

## 2021-04-17 DIAGNOSIS — R002 Palpitations: Secondary | ICD-10-CM | POA: Insufficient documentation

## 2021-04-17 DIAGNOSIS — R0600 Dyspnea, unspecified: Secondary | ICD-10-CM | POA: Diagnosis not present

## 2021-04-17 DIAGNOSIS — I10 Essential (primary) hypertension: Secondary | ICD-10-CM | POA: Diagnosis not present

## 2021-04-17 DIAGNOSIS — I442 Atrioventricular block, complete: Secondary | ICD-10-CM

## 2021-04-17 DIAGNOSIS — E782 Mixed hyperlipidemia: Secondary | ICD-10-CM

## 2021-04-17 DIAGNOSIS — I77810 Thoracic aortic ectasia: Secondary | ICD-10-CM

## 2021-04-17 DIAGNOSIS — R072 Precordial pain: Secondary | ICD-10-CM | POA: Diagnosis not present

## 2021-04-17 DIAGNOSIS — R079 Chest pain, unspecified: Secondary | ICD-10-CM

## 2021-04-17 DIAGNOSIS — R0609 Other forms of dyspnea: Secondary | ICD-10-CM | POA: Insufficient documentation

## 2021-04-17 DIAGNOSIS — Z952 Presence of prosthetic heart valve: Secondary | ICD-10-CM | POA: Diagnosis not present

## 2021-04-17 NOTE — Progress Notes (Signed)
Cardiology Office Note:    Date:  04/17/2021   ID:  [name], DOB 10/09/1955, MRN [id number]  PCP:  Cher Nakai, MD  Cardiologist:  Jenean Lindau, MD   Referring MD: Cher Nakai, MD    ASSESSMENT:    1. Ascending aorta dilation (HCC)   2. Complete heart block ([place])   3. Essential hypertension   4. Mixed hyperlipidemia   5. S/P AVR (aortic valve replacement)   6. Palpitations   7. Chest pain of uncertain etiology   8. Precordial pain   9. DOE (dyspnea on exertion)    PLAN:    In order of problems listed above:  Primary prevention stressed with the patient.  Importance of compliance with diet medication stressed any vocalized understanding. Ascending aortic aneurysm: Stable at this time and we will continue to monitor.  This was discussed with him Palpitations: TSH is normal.  It was done in June.  We will do a 2-week monitor to assess the symptoms. Dyspnea on exertion: Patient has multiple risk factors for coronary artery disease and I suggested Lexiscan sestamibi and he is agreeable. Mixed dyslipidemia and diabetes mellitus: Managed by primary care.  Patient on statin therapy and tolerating well. Permanent pacemaker for complete heart block: I reviewed electrophysiology and pacemaker interrogation notes and discussed with the patient. Patient will be seen in follow-up appointment in 6 months or earlier if the patient has any concerns    Medication Adjustments/Labs and Tests Ordered: Current medicines are reviewed at length with the patient today.  Concerns regarding medicines are outlined above.  Orders Placed This Encounter  Procedures   LONG TERM MONITOR (3-14 DAYS)   MYOCARDIAL PERFUSION IMAGING   EKG 12-Lead    No orders of the defined types were placed in this encounter.    No chief complaint on file.    History of Present Illness:    Antonio Oliver is a 65 y.o. male.  Patient has past medical history of essential hypertension, permanent pacemaker  post complete heart block, ascending aortic dilatation and aortic valve replacement.  He denies any chest pain orthopnea or PND however he mentions to me that she has some shortness of breath on exertion more than usual.  The person who accompanies him mentions that he occasionally has palpitations.  He is a diabetic.  At the time of my evaluation, the patient is alert awake oriented and in no distress.  Past Medical History:  Diagnosis Date   Anxiety 10/30/2018   Ascending aorta dilation ([place]) 03/06/2019   Back pain 01/05/2021   Benign prostatic hyperplasia without lower urinary tract symptoms 11/17/2016   Last Assessment & Plan:  Stable PSA today.  Formatting of this note might be different from the original. Last Assessment & Plan:  Stable PSA today. Last Assessment & Plan:  Formatting of this note might be different from the original. Stable PSA today.   Bicuspid aortic valve 12/22/2016   BPH (benign prostatic hyperplasia)    Bradycardia 02/01/2019   Cervical myelopathy ([place]) 11/14/2018   Charcot's joint of foot 03/05/2019   Chronic back pain 04/03/2013   Chronic bilateral low back pain 11/17/2016   Chronic inflammatory arthritis 11/17/2016   History of positive rheumatoid factor in the past. Denies placement on immunosuppressive therapy   Complete heart block ([place]) 05/04/2017   Depression    Diabetes mellitus due to underlying condition with unspecified complications ([place]) [id number]   Diabetes mellitus type 2 in nonobese [facility])    DM2 (  diabetes mellitus, type 2) ([place]) 04/03/2013   Last Assessment & Plan:  Formatting of this note might be different from the original. Diabetes is unchanged.  Continue current treatment regimen. Regular aerobic exercise. Diabetes will be reassessed in 3 months. Will check A1c today. Denies any problems with feet or sensation.   Enthesopathy of ankle and tarsus 04/04/2013   Last Assessment & Plan:  Continues with swelling after prolonged standing.  Still   Wearing support hose, which helps.   Essential hypertension 10/30/2018   Fatigue 10/30/2018   FHx: migraine headaches 04/04/2013   Fracture of capitate bone of wrist 02/26/2019   Fracture of tibia 03/05/2019   GERD (gastroesophageal reflux disease) 10/30/2018   Glaucoma 04/03/2013   Heart murmur 11/17/2016   Hemangioma of skin and subcutaneous tissue 07/07/2017   History of kidney stones 11/17/2016   Hyperlipidemia 10/30/2018   Hypertension    Hypothyroidism 10/30/2018   Lentigo 07/07/2017   Lesion of plantar nerve 04/04/2013   Leukocytosis    Lumbar stenosis with neurogenic claudication 01/08/2021   Migraine 11/17/2016   Mixed dyslipidemia 10/30/2018   Morbid obesity ([place]) 10/27/2011   Multiple actinic keratoses 07/07/2017   Multiple benign melanocytic nevi 07/07/2017   Neck pain 04/04/2013   Nondisplaced fracture of medial cuneiform of left foot, initial encounter for closed fracture 11/16/2018   Osteoarthritis 10/30/2018   Personal history of tobacco use, presenting hazards to health 04/04/2013   Presence of permanent cardiac pacemaker    S/P AVR (aortic valve replacement) 03/10/2017   Severe single current episode of major depressive disorder, without psychotic features ([place]) 11/17/2016   Status post bariatric surgery 04/04/2013   Strain of rotator cuff 04/04/2013   Syncope 02/14/2019   Vitamin D deficiency 10/30/2018   Weakness     Past Surgical History:  Procedure Laterality Date   ANTERIOR CERVICAL DECOMP/DISCECTOMY FUSION N/A 11/17/2018   Procedure: CERVICAL THREE-CERVICAL FOUR, CERVICAL FOUR-CERVICAL FIVE, CERVICAL FIVE-CERVICAL SIX ANTERIOR CERVICAL DECOMPRESSION/DISCECTOMY FUSION;  Surgeon: Earnie Larsson, MD;  Location: [facility];  Service: Neurosurgery;  Laterality: N/A;   APPLICATION OF WOUND VAC Left 04/05/2019   Procedure: Application Of Wound Vac;  Surgeon: Erle Crocker, MD;  Location: [facility];  Service: Orthopedics;  Laterality: Left;   BACK SURGERY     x4    FASCIOTOMY Left 04/05/2019   Left leg 2 compartment fasciotomy   FASCIOTOMY Left 04/05/2019   Procedure: FASCIOTOMY LEFT LOWER LEG;  Surgeon: Erle Crocker, MD;  Location: [facility];  Service: Orthopedics;  Laterality: Left;   FOOT NEUROMA SURGERY     GASTRIC BYPASS     HEMATOMA EVACUATION Left 04/05/2019   Procedure: Evacuation Hematoma Left Lower Leg;  Surgeon: Erle Crocker, MD;  Location: [facility];  Service: Orthopedics;  Laterality: Left;   HEMORRHOID SURGERY     over 30 years ago   [place] umb   LUMBAR LAMINECTOMY/DECOMPRESSION MICRODISCECTOMY N/A 01/08/2021   Procedure: CENTRAL LUMBAR LAMINECTOMY LUMBAR TWO-THREE, LUMBAR THREE-FOUR;  Surgeon: Jessy Oto, MD;  Location: [facility];  Service: Orthopedics;  Laterality: N/A;   NASAL SINUS SURGERY     x2   NECK SURGERY      Current Medications: Current Meds  Medication Sig   aspirin EC 81 MG tablet Take 81 mg by mouth daily.   atorvastatin (LIPITOR) 20 MG tablet Take 20 mg by mouth daily at 6 PM.    brexpiprazole (REXULTI) 2 MG TABS tablet Take 2 mg by mouth  daily.    buPROPion (WELLBUTRIN SR) 200 MG 12 hr tablet Take 200 mg by mouth 2 (two) times daily.   busPIRone (BUSPAR) 10 MG tablet Take 20 mg by mouth 2 (two) times daily.    calcium citrate (CALCITRATE - DOSED IN MG ELEMENTAL CALCIUM) 950 MG tablet Take 200 mg of elemental calcium by mouth 2 (two) times daily.   Cholecalciferol 125 MCG (5000 UT) TABS Take 125 mcg by mouth daily.   clonazePAM (KLONOPIN) 0.5 MG tablet Take 0.5 mg by mouth 2 (two) times daily as needed for anxiety.   diclofenac Sodium (VOLTAREN) 1 % GEL Apply 1 application topically 4 (four) times daily.   DULoxetine (CYMBALTA) 60 MG capsule Take 60 mg by mouth 2 (two) times daily.    fexofenadine (ALLEGRA) 180 MG tablet Take 180 mg by mouth daily as needed for allergies.   finasteride (PROSCAR) 5 MG tablet Take 5 mg by mouth daily.   fluticasone (FLONASE) 50 MCG/ACT nasal spray Place 2 sprays  into the nose daily as needed for allergies or rhinitis.   folic acid (FOLVITE) 1 MG tablet Take 1 mg by mouth daily.   furosemide (LASIX) 20 MG tablet Take 1 tablet (20 mg total) by mouth as needed (for leg swelling).   gabapentin (NEURONTIN) 600 MG tablet Take 600-1,200 mg by mouth 3 (three) times daily.   Galcanezumab-gnlm (EMGALITY) 120 MG/ML SOAJ Inject 120 mg into the skin every 28 (twenty-eight) days.   levothyroxine (SYNTHROID) 125 MCG tablet Take 125 mcg by mouth daily.    lisinopril (ZESTRIL) 2.5 MG tablet Take 2.5 mg by mouth as needed (high blood pressure).   Magnesium 500 MG CAPS Take 500 mg by mouth 2 (two) times a day.    metFORMIN (GLUCOPHAGE) 1000 MG tablet Take 1,000 mg by mouth 2 (two) times daily with a meal.   metoprolol succinate (TOPROL-XL) 50 MG 24 hr tablet Take 25 mg by mouth 2 (two) times daily.   mirtazapine (REMERON) 30 MG tablet Take 30 mg by mouth at bedtime.    Multiple Vitamins-Minerals (MULTIVITAMIN WITH MINERALS) tablet Take 1 tablet by mouth daily.   mupirocin ointment (BACTROBAN) 2 % Apply 1 application topically 3 (three) times daily as needed (infection).   naloxone (NARCAN) nasal spray 4 mg/0.1 mL Instill or spray into the nose if you are experiencing shortness of breath or signs of opioid overdose.   omeprazole (PRILOSEC) 20 MG capsule Take 20 mg by mouth 2 (two) times daily before a meal.    oxyCODONE-acetaminophen (PERCOCET) 10-325 MG tablet Take 1 tablet by mouth 3 (three) times daily.   polyethylene glycol (MIRALAX / GLYCOLAX) 17 g packet Take 17 g by mouth daily.   potassium chloride SA (KLOR-CON) 20 MEQ tablet Take 20 mEq by mouth daily as needed (potassium defficiency). With lasix   Semaglutide (OZEMPIC, 0.25 OR 0.5 MG/DOSE, [place]) Inject 0.5 mg into the skin once a week.   solifenacin (VESICARE) 10 MG tablet Take 10 mg by mouth daily.   tiZANidine (ZANAFLEX) 4 MG capsule Take 4-8 mg by mouth 3 (three) times daily as needed for muscle spasms.    traMADol (ULTRAM) 50 MG tablet Take 50 mg by mouth 3 (three) times daily.   Ubrogepant 100 MG TABS Take 100 mg by mouth daily as needed for migraine.     Allergies:   Morphine   Social History   Socioeconomic History   Marital status: Married    Spouse name: susan    Number of  children: 1   Years of education: Not on file   Highest education level: Bachelor's degree (e.g., BA, AB, BS)  Occupational History   Not on file  Tobacco Use   Smoking status: Never   Smokeless tobacco: Never  Vaping Use   Vaping Use: Never used  Substance and Sexual Activity   Alcohol use: Yes    Alcohol/week: 1.0 standard drink    Types: 1 Glasses of wine per week    Comment: occ.   Drug use: No   Sexual activity: Not on file  Other Topics Concern   Not on file  Social History Narrative   Social Review      Patient lives at home with. Spouse [name]   Pt lives in a one or two story home? TWO Story patient lives on first story of home   Does patient lives in a facility? If so where? Private home   What is patient highest level of education? Bachelors degree   Is patient RIGHT or LEFT handed? Right Hand   Does patient drink coffee, tea, soda? Coffee, Tea   How much coffee, tea, soda does patient drink? Coffee in AM, Tea off and on all day   Does patient exercise regularly? No    Social Determinants of Radio broadcast assistant Strain: Not on file  Food Insecurity: Not on file  Transportation Needs: Not on file  Physical Activity: Not on file  Stress: Not on file  Social Connections: Not on file     Family History: The patient's family history includes Anxiety disorder in his daughter; Cancer in his mother; Depression in his daughter; Diabetes in his brother, brother, father, and mother; Heart disease in his brother and father; Hypertension in his brother and brother; Kidney disease in his father; Osteoarthritis in his brother; Rheum arthritis in his brother and mother.  ROS:   Please see  the history of present illness.    All other systems reviewed and are negative.  EKGs/Labs/Other Studies Reviewed:    The following studies were reviewed today: IMPRESSIONS     1. Left ventricular ejection fraction, by estimation, is 60 to 65%. The  left ventricle has normal function. The left ventricle has no regional  wall motion abnormalities. There is mild concentric left ventricular  hypertrophy. Left ventricular diastolic  parameters are indeterminate. The average left ventricular global  longitudinal strain is -8.9 %. The global longitudinal strain is abnormal.   2. Right ventricular systolic function is normal. The right ventricular  size is normal. There is normal pulmonary artery systolic pressure.   3. The mitral valve is leaflets is thickened with suspicion for redundant  chordae. Moderate mitral valve regurgitation. No evidence of mitral  stenosis.   4. Well seated bioprosthetic aortic valve. Aortic valve regurgitation is  not visualized. No aortic stenosis is present.   5. Aneurysm of the ascending aorta, measuring 44 mm. The abdominal aorta  is dilated (2.5 cm).   6. The inferior vena cava is normal in size with greater than 50%  respiratory variability, suggesting right atrial pressure of 3 mmHg.    Recent Labs: 01/06/2021: ALT 49 01/09/2021: BUN 14; Creatinine, Ser 1.00; Hemoglobin 14.3; Platelets 209; Potassium 5.1; Sodium 135  Recent Lipid Panel    Component Value Date/Time   CHOL 161 07/12/2019 0910   TRIG 99 07/12/2019 0910   HDL 62 07/12/2019 0910   CHOLHDL 2.6 07/12/2019 0910   CHOLHDL 2.3 07/28/2011 1630   VLDL 24 07/28/2011 1630  Bartholomew 81 07/12/2019 0910    Physical Exam:    VS:  BP 126/88   Pulse 65   Ht '6\' 3"'$  (1.905 m)   Wt 203 lb 12.8 oz (92.4 kg)   SpO2 97%   BMI 25.47 kg/m     Wt Readings from Last 3 Encounters:  04/17/21 203 lb 12.8 oz (92.4 kg)  04/02/21 200 lb (90.7 kg)  02/18/21 199 lb 12.8 oz (90.6 kg)     GEN: Patient is in  no acute distress HEENT: Normal NECK: No JVD; No carotid bruits LYMPHATICS: No lymphadenopathy CARDIAC: Hear sounds regular, 2/6 systolic murmur at the apex. RESPIRATORY:  Clear to auscultation without rales, wheezing or rhonchi  ABDOMEN: Soft, non-tender, non-distended MUSCULOSKELETAL:  No edema; No deformity  SKIN: Warm and dry NEUROLOGIC:  Alert and oriented x 3 PSYCHIATRIC:  Normal affect   Signed, Jenean Lindau, MD  04/17/2021 9:54 AM    [place]

## 2021-04-17 NOTE — Patient Instructions (Signed)
Medication Instructions:  No medication changes. *If you need a refill on your cardiac medications before your next appointment, please call your pharmacy*   Lab Work: None ordered If you have labs (blood work) drawn today and your tests are completely normal, you will receive your results only by: [place] (if you have MyChart) OR A paper copy in the mail If you have any lab test that is abnormal or we need to change your treatment, we will call you to review the results.   Testing/Procedures: Your physician has requested that you have a lexiscan myoview. For further information please visit [email or web address]. Please follow instruction sheet, as given.  The test will take approximately 3 to 4 hours to complete; you may bring reading material.  If someone comes with you to your appointment, they will need to remain in the main lobby due to limited space in the testing area.   How to prepare for your Myocardial Perfusion Test: Do not eat or drink 3 hours prior to your test, except you may have water. Do not consume products containing caffeine (regular or decaffeinated) 12 hours prior to your test. (ex: coffee, chocolate, sodas, tea). Do bring a list of your current medications with you.  If not listed below, you may take your medications as normal. Do wear comfortable clothes (no dresses or overalls) and walking shoes, tennis shoes preferred (No heels or open toe shoes are allowed). Do NOT wear cologne, perfume, aftershave, or lotions (deodorant is allowed). If these instructions are not followed, your test will have to be rescheduled.   WHY IS MY DOCTOR PRESCRIBING ZIO? The Zio system is proven and trusted by physicians to detect and diagnose irregular heart rhythms -- and has been prescribed to hundreds of thousands of patients.  The FDA has cleared the Zio system to monitor for many different kinds of irregular heart rhythms. In a study, physicians were able to reach a  diagnosis 90% of the time with the Zio system1.  You can wear the Zio monitor -- a small, discreet, comfortable patch -- during your normal day-to-day activity, including while you sleep, shower, and exercise, while it records every single heartbeat for analysis.  1Barrett, P., et al. Comparison of 24 Hour Holter Monitoring Versus 14 Day Novel Adhesive Patch Electrocardiographic Monitoring. [place], 2014.  ZIO VS. HOLTER MONITORING The Zio monitor can be comfortably worn for up to 14 days. Holter monitors can be worn for 24 to 48 hours, limiting the time to record any irregular heart rhythms you may have. Zio is able to capture data for the 51% of patients who have their first symptom-triggered arrhythmia after 48 hours.1  LIVE WITHOUT RESTRICTIONS The Zio ambulatory cardiac monitor is a small, unobtrusive, and water-resistant patch--you might even forget you're wearing it. The Zio monitor records and stores every beat of your heart, whether you're sleeping, working out, or showering. Wear the monitor for 14 days, remove 05/01/21.  Follow-Up: At [facility], you and your health needs are our priority.  As part of our continuing mission to provide you with exceptional heart care, we have created designated Provider Care Teams.  These Care Teams include your primary Cardiologist (physician) and Advanced Practice Providers (APPs -  Physician Assistants and Nurse Practitioners) who all work together to provide you with the care you need, when you need it.  We recommend signing up for the patient portal called "MyChart".  Sign up information is provided on this After Visit Summary.  MyChart is used to connect with patients for Virtual Visits (Telemedicine).  Patients are able to view lab/test results, encounter notes, upcoming appointments, etc.  Non-urgent messages can be sent to your provider as well.   To learn more about what you can do with MyChart, go to  [email or web address].    Your next appointment:   6 month(s)  The format for your next appointment:   In Person  Provider:   Jyl Heinz, MD   Other Instructions Cardiac Nuclear Scan A cardiac nuclear scan is a test that is done to check the flow of blood to your heart. It is done when you are resting and when you are exercising. The test looks for problems such as: Not enough blood reaching a portion of the heart. The heart muscle not working as it should. You may need this test if: You have heart disease. You have had lab results that are not normal. You have had heart surgery or a balloon procedure to open up blocked arteries (angioplasty). You have chest pain. You have shortness of breath. In this test, a special dye (tracer) is put into your bloodstream. The tracer will travel to your heart. A camera will then take pictures of your heart to see how the tracer moves through your heart. This test is usually done at a hospital and takes 2-4 hours. Tell a doctor about: Any allergies you have. All medicines you are taking, including vitamins, herbs, eye drops, creams, and over-the-counter medicines. Any problems you or family members have had with anesthetic medicines. Any blood disorders you have. Any surgeries you have had. Any medical conditions you have. Whether you are pregnant or may be pregnant. What are the risks? Generally, this is a safe test. However, problems may occur, such as: Serious chest pain and heart attack. This is only a risk if the stress portion of the test is done. Rapid heartbeat. A feeling of warmth in your chest. This feeling usually does not last long. Allergic reaction to the tracer. What happens before the test? Ask your doctor about changing or stopping your normal medicines. This is important. Follow instructions from your doctor about what you cannot eat or drink. Remove your jewelry on the day of the test. What happens during the  test? An IV tube will be inserted into one of your veins. Your doctor will give you a small amount of tracer through the IV tube. You will wait for 20-40 minutes while the tracer moves through your bloodstream. Your heart will be monitored with an electrocardiogram (ECG). You will lie down on an exam table. Pictures of your heart will be taken for about 15-20 minutes. You may also have a stress test. For this test, one of these things may be done: You will be asked to exercise on a treadmill or a stationary bike. You will be given medicines that will make your heart work harder. This is done if you are unable to exercise. When blood flow to your heart has peaked, a tracer will again be given through the IV tube. After 20-40 minutes, you will get back on the exam table. More pictures will be taken of your heart. Depending on the tracer that is used, more pictures may need to be taken 3-4 hours later. Your IV tube will be removed when the test is over. The test may vary among doctors and hospitals. What happens after the test? Ask your doctor: Whether you can return to your normal schedule, including diet, activities,  and medicines. Whether you should drink more fluids. This will help to remove the tracer from your body. Drink enough fluid to keep your pee (urine) pale yellow. Ask your doctor, or the department that is doing the test: When will my results be ready? How will I get my results? Summary A cardiac nuclear scan is a test that is done to check the flow of blood to your heart. Tell your doctor whether you are pregnant or may be pregnant. Before the test, ask your doctor about changing or stopping your normal medicines. This is important. Ask your doctor whether you can return to your normal activities. You may be asked to drink more fluids. This information is not intended to replace advice given to you by your health care provider. Make sure you discuss any questions you have with  your health care provider. Document Revised: 12/13/2018 Document Reviewed: 02/06/2018 Elsevier Patient Education  [place].

## 2021-04-21 NOTE — Progress Notes (Signed)
NEUROLOGY FOLLOW UP OFFICE NOTE  GABIN SCHWIETERMAN [id number]  Assessment/Plan:   Migraine without aura, without status migrainosus, not intractable Hypertension   1.  Migraine prevention:  Emgality monthly 2.  Migraine rescue:  Roselyn Meier '100mg'$  3.  Limit use of pain relievers to no more than 2 days out of week to prevent risk of rebound or medication-overuse headache. 4.  Keep headache diary 5.  Blood pressure elevated.  His protocol by his other provider is to take a lisinopril which he will do Follow up one year  Subjective:  OREST BROSH is a 65year old male with complete heart block s/p PPM, possible rheumatoid arthritis, diabetes, depression/anxiety, chronic back pain and history of cervical myelopathy follows up for migraines   UPDATE: Intensity:  moderate Duration:  30 minutes to 3 hours Frequency:  1 every 6 weeks to 2 every 4 weeks. Due to payment issues, he has not been able to get his Emgality for 20 days.  He has had one migraine in past 20 days.   Rescue therapy:  Ubrelvy Current NSAIDS:  ASA '81mg'$  Current analgesics:  tramadol (daily, for generalized pain); oxycodone (for general pain) Current triptans: none Current ergotamine:  none Current anti-emetic:  none Current muscle relaxants:  tizanidine Current anti-anxiolytic:  BuSpar; clonazepam Current sleep aide:  none Current Antihypertensive medications:  Toprol-XL, Lasix Current Antidepressant medications:  Cymbalta '60mg'$  twice daily, Wellbutrin SR '200mg'$  twice daily, Remeron '60mg'$  Current Anticonvulsant medications:  gabapentin '600mg'$  TID Current anti-CGRP:  Emgality; Ubrelvy '100mg'$  Current Vitamins/Herbal/Supplements:  Iron, folic acid, MVI Current Antihistamines/Decongestants:  Sudafed Other therapy:  none Other medications:  Mirapex   Caffeine:  1 cup coffee daily.   Diet:  Does not drink water or soda.   Exercise:  no Depression:  yes; Anxiety:  Yes.  Major depressive disorder.  Needed to retire in 2002.    Other pain:  Arthralgias, chronic neck pain, Charcot foot Sleep hygiene:  poor   HISTORY:  Onset:  1980s Location:  Usually bifrontal Quality:  Squeezing, throbbing, pounding Initial Intensity:  7-8/10.  He denies new headache, thunderclap headache or severe headache that wakes from sleep. Aura:  none Premonitory Phase:  none Postdrome:  none Associated symptoms: Photophobia, phonophobia, blurred vision.  He denies associated nausea, vomiting, unilateral numbness or weakness. Initial Duration:  Usually a day.  Has happened up to 3 days. Initial Frequency:  10 to 15 days a month, started Botox in 2018 and improved once a month. Initial Frequency of abortive medication: 1 a month Triggers:  Unknown Relieving factors:  none Activity:  aggravates   CT head 11/14/18 personally reviewed and was normal. CT cervical spine 11/06/18 personally reviewed and demonstrated multilevel severe facet arthropathy with moderate bilateral neural foraminal stenosis at C3-4 and C4-5 as well as prior interbody fusion at C6-7 without stenosis.   Past NSAIDS:  ibuprofen Past analgesics:  Tylenol, Excedrin, Fioricet, Midrin Past abortive triptans:   Zomig '5mg'$ , Sumatriptan '6mg'$  [place] Past abortive ergotamine: none Past muscle relaxants:  Zanaflex Past anti-emetic:  none Past antihypertensive medications:  Verapamil, propranolol, clonidine Past antidepressant medications:  amitriptyline Past anticonvulsant medications:  topiramate '200mg'$  Past anti-CGRP:  Aimovig '140mg'$  Past vitamins/Herbal/Supplements:  none Past antihistamines/decongestants:  none Other past therapies:  Botox (effective but did not like getting all those injections every 3 months)   Family history of headache:  2 brothers had migraines.    PAST MEDICAL HISTORY: Past Medical History:  Diagnosis Date   Anxiety 10/30/2018   Ascending  aorta dilation ([place]) 03/06/2019   Back pain 01/05/2021   Benign prostatic hyperplasia without lower urinary  tract symptoms 11/17/2016   Last Assessment & Plan:  Stable PSA today.  Formatting of this note might be different from the original. Last Assessment & Plan:  Stable PSA today. Last Assessment & Plan:  Formatting of this note might be different from the original. Stable PSA today.   Bicuspid aortic valve 12/22/2016   BPH (benign prostatic hyperplasia)    Bradycardia 02/01/2019   Cervical myelopathy ([place]) 11/14/2018   Charcot's joint of foot 03/05/2019   Chronic back pain 04/03/2013   Chronic bilateral low back pain 11/17/2016   Chronic inflammatory arthritis 11/17/2016   History of positive rheumatoid factor in the past. Denies placement on immunosuppressive therapy   Complete heart block ([place]) 05/04/2017   Depression    Diabetes mellitus due to underlying condition with unspecified complications ([place]) [id number]   Diabetes mellitus type 2 in nonobese (HCC)    DM2 (diabetes mellitus, type 2) ([place]) 04/03/2013   Last Assessment & Plan:  Formatting of this note might be different from the original. Diabetes is unchanged.  Continue current treatment regimen. Regular aerobic exercise. Diabetes will be reassessed in 3 months. Will check A1c today. Denies any problems with feet or sensation.   Enthesopathy of ankle and tarsus 04/04/2013   Last Assessment & Plan:  Continues with swelling after prolonged standing.  Still  Wearing support hose, which helps.   Essential hypertension 10/30/2018   Fatigue 10/30/2018   FHx: migraine headaches 04/04/2013   Fracture of capitate bone of wrist 02/26/2019   Fracture of tibia 03/05/2019   GERD (gastroesophageal reflux disease) 10/30/2018   Glaucoma 04/03/2013   Heart murmur 11/17/2016   Hemangioma of skin and subcutaneous tissue 07/07/2017   History of kidney stones 11/17/2016   Hyperlipidemia 10/30/2018   Hypertension    Hypothyroidism 10/30/2018   Lentigo 07/07/2017   Lesion of plantar nerve 04/04/2013   Leukocytosis    Lumbar stenosis with  neurogenic claudication 01/08/2021   Migraine 11/17/2016   Mixed dyslipidemia 10/30/2018   Morbid obesity ([place]) 10/27/2011   Multiple actinic keratoses 07/07/2017   Multiple benign melanocytic nevi 07/07/2017   Neck pain 04/04/2013   Nondisplaced fracture of medial cuneiform of left foot, initial encounter for closed fracture 11/16/2018   Osteoarthritis 10/30/2018   Personal history of tobacco use, presenting hazards to health 04/04/2013   Presence of permanent cardiac pacemaker    S/P AVR (aortic valve replacement) 03/10/2017   Severe single current episode of major depressive disorder, without psychotic features ([place]) 11/17/2016   Status post bariatric surgery 04/04/2013   Strain of rotator cuff 04/04/2013   Syncope 02/14/2019   Vitamin D deficiency 10/30/2018   Weakness     MEDICATIONS: Current Outpatient Medications on File Prior to Visit  Medication Sig Dispense Refill   aspirin EC 81 MG tablet Take 81 mg by mouth daily.     atorvastatin (LIPITOR) 20 MG tablet Take 20 mg by mouth daily at 6 PM.      brexpiprazole (REXULTI) 2 MG TABS tablet Take 2 mg by mouth daily.      buPROPion (WELLBUTRIN SR) 200 MG 12 hr tablet Take 200 mg by mouth 2 (two) times daily.     busPIRone (BUSPAR) 10 MG tablet Take 20 mg by mouth 2 (two) times daily.      calcium citrate (CALCITRATE - DOSED IN MG ELEMENTAL CALCIUM) 950 MG tablet Take 200 mg of elemental  calcium by mouth 2 (two) times daily.     Cholecalciferol 125 MCG (5000 UT) TABS Take 125 mcg by mouth daily.     clonazePAM (KLONOPIN) 0.5 MG tablet Take 0.5 mg by mouth 2 (two) times daily as needed for anxiety.     diclofenac Sodium (VOLTAREN) 1 % GEL Apply 1 application topically 4 (four) times daily.     docusate sodium (COLACE) 100 MG capsule Take 1 capsule (100 mg total) by mouth 2 (two) times daily. (Patient not taking: Reported on 04/17/2021) 10 capsule 0   DULoxetine (CYMBALTA) 60 MG capsule Take 60 mg by mouth 2 (two) times daily.       fexofenadine (ALLEGRA) 180 MG tablet Take 180 mg by mouth daily as needed for allergies.     finasteride (PROSCAR) 5 MG tablet Take 5 mg by mouth daily.     fluticasone (FLONASE) 50 MCG/ACT nasal spray Place 2 sprays into the nose daily as needed for allergies or rhinitis.     folic acid (FOLVITE) 1 MG tablet Take 1 mg by mouth daily.     furosemide (LASIX) 20 MG tablet Take 1 tablet (20 mg total) by mouth as needed (for leg swelling). 30 tablet 4   gabapentin (NEURONTIN) 600 MG tablet Take 600-1,200 mg by mouth 3 (three) times daily.     Galcanezumab-gnlm (EMGALITY) 120 MG/ML SOAJ Inject 120 mg into the skin every 28 (twenty-eight) days. 1 mL 0   levothyroxine (SYNTHROID) 125 MCG tablet Take 125 mcg by mouth daily.      lisinopril (ZESTRIL) 2.5 MG tablet Take 2.5 mg by mouth as needed (high blood pressure).     Magnesium 500 MG CAPS Take 500 mg by mouth 2 (two) times a day.      metFORMIN (GLUCOPHAGE) 1000 MG tablet Take 1,000 mg by mouth 2 (two) times daily with a meal.     metoprolol succinate (TOPROL-XL) 50 MG 24 hr tablet Take 25 mg by mouth 2 (two) times daily.     mirtazapine (REMERON) 30 MG tablet Take 30 mg by mouth at bedtime.      Multiple Vitamins-Minerals (MULTIVITAMIN WITH MINERALS) tablet Take 1 tablet by mouth daily.     mupirocin ointment (BACTROBAN) 2 % Apply 1 application topically 3 (three) times daily as needed (infection).     naloxone (NARCAN) nasal spray 4 mg/0.1 mL Instill or spray into the nose if you are experiencing shortness of breath or signs of opioid overdose. 1 each 0   omeprazole (PRILOSEC) 20 MG capsule Take 20 mg by mouth 2 (two) times daily before a meal.      oxyCODONE-acetaminophen (PERCOCET) 10-325 MG tablet Take 1 tablet by mouth 3 (three) times daily.     polyethylene glycol (MIRALAX / GLYCOLAX) 17 g packet Take 17 g by mouth daily. 14 each 0   potassium chloride SA (KLOR-CON) 20 MEQ tablet Take 20 mEq by mouth daily as needed (potassium defficiency). With  lasix     Semaglutide (OZEMPIC, 0.25 OR 0.5 MG/DOSE, [place]) Inject 0.5 mg into the skin once a week.     solifenacin (VESICARE) 10 MG tablet Take 10 mg by mouth daily.     testosterone cypionate (DEPOTESTOSTERONE CYPIONATE) 200 MG/ML injection Inject 200 mg into the muscle every 30 (thirty) days. (Patient not taking: Reported on 04/17/2021)     tiZANidine (ZANAFLEX) 4 MG capsule Take 4-8 mg by mouth 3 (three) times daily as needed for muscle spasms.     traMADol (ULTRAM) 50  MG tablet Take 50 mg by mouth 3 (three) times daily.     Ubrogepant 100 MG TABS Take 100 mg by mouth daily as needed for migraine.     No current facility-administered medications on file prior to visit.    ALLERGIES: Allergies  Allergen Reactions   Morphine Itching    Can take with benadry, facial and nose itching     FAMILY HISTORY: Family History  Problem Relation Age of Onset   Cancer Mother        breast   Rheum arthritis Mother    Diabetes Mother    Heart disease Father    Kidney disease Father    Diabetes Father    Hypertension Brother    Diabetes Brother    Osteoarthritis Brother    Heart disease Brother    Hypertension Brother    Diabetes Brother    Rheum arthritis Brother    Depression Daughter    Anxiety disorder Daughter       Objective:  Blood pressure (!) 164/105, pulse 64, height '6\' 3"'$  (1.905 m), weight 199 lb 9.6 oz (90.5 kg), SpO2 99 %. General: No acute distress.  Patient appears well-groomed.   Head:  Normocephalic/atraumatic Eyes:  Fundi examined but not visualized Neck: supple, no paraspinal tenderness, full range of motion Heart:  Regular rate and rhythm Lungs:  Clear to auscultation bilaterally Back: No paraspinal tenderness Neurological Exam: alert and oriented to person, place, and time.  Speech fluent and not dysarthric, language intact.  CN II-XII intact. Bulk and tone normal, muscle strength 5/5 throughout.  Sensation to light touch intact.  Deep tendon reflexes 1+  throughout, toes downgoing.  Finger to nose testing intact.  Gait cautious and requiring assistance with a cane.  Romberg with mild sway.   Metta Clines, DO  CC: Cher Nakai, MD

## 2021-04-22 ENCOUNTER — Other Ambulatory Visit: Payer: Self-pay

## 2021-04-22 ENCOUNTER — Encounter: Payer: Self-pay | Admitting: Neurology

## 2021-04-22 ENCOUNTER — Ambulatory Visit: Payer: Medicare PPO | Admitting: Neurology

## 2021-04-22 VITALS — BP 164/105 | HR 64 | Ht 75.0 in | Wt 199.6 lb

## 2021-04-22 DIAGNOSIS — G43009 Migraine without aura, not intractable, without status migrainosus: Secondary | ICD-10-CM

## 2021-04-22 DIAGNOSIS — I10 Essential (primary) hypertension: Secondary | ICD-10-CM

## 2021-04-22 MED ORDER — EMGALITY 120 MG/ML ~~LOC~~ SOAJ: 120.0000 mg | SUBCUTANEOUS | 5 refills | Status: DC

## 2021-04-22 MED ORDER — UBRELVY 100 MG PO TABS: 1.0000 | ORAL_TABLET | ORAL | 5 refills | Status: DC | PRN

## 2021-04-22 NOTE — Patient Instructions (Signed)
Refilled Emgality and [employer]

## 2021-04-23 DIAGNOSIS — D0461 Carcinoma in situ of skin of right upper limb, including shoulder: Secondary | ICD-10-CM | POA: Diagnosis not present

## 2021-04-23 DIAGNOSIS — D0462 Carcinoma in situ of skin of left upper limb, including shoulder: Secondary | ICD-10-CM | POA: Diagnosis not present

## 2021-04-27 DIAGNOSIS — R5382 Chronic fatigue, unspecified: Secondary | ICD-10-CM | POA: Diagnosis not present

## 2021-04-27 DIAGNOSIS — E785 Hyperlipidemia, unspecified: Secondary | ICD-10-CM | POA: Diagnosis not present

## 2021-04-27 DIAGNOSIS — N400 Enlarged prostate without lower urinary tract symptoms: Secondary | ICD-10-CM | POA: Diagnosis not present

## 2021-04-27 DIAGNOSIS — Z794 Long term (current) use of insulin: Secondary | ICD-10-CM | POA: Diagnosis not present

## 2021-04-27 DIAGNOSIS — N3281 Overactive bladder: Secondary | ICD-10-CM | POA: Diagnosis not present

## 2021-04-27 DIAGNOSIS — R000 Tachycardia, unspecified: Secondary | ICD-10-CM | POA: Diagnosis not present

## 2021-04-27 DIAGNOSIS — E291 Testicular hypofunction: Secondary | ICD-10-CM | POA: Diagnosis not present

## 2021-04-27 DIAGNOSIS — I10 Essential (primary) hypertension: Secondary | ICD-10-CM | POA: Diagnosis not present

## 2021-04-27 DIAGNOSIS — E1121 Type 2 diabetes mellitus with diabetic nephropathy: Secondary | ICD-10-CM | POA: Diagnosis not present

## 2021-04-27 DIAGNOSIS — M659 Synovitis and tenosynovitis, unspecified: Secondary | ICD-10-CM | POA: Diagnosis not present

## 2021-04-27 NOTE — Addendum Note (Signed)
Addended by: Truddie Hidden on: 04/27/2021 09:31 AM   Modules accepted: Orders

## 2021-04-27 NOTE — Addendum Note (Signed)
Addended by: Jyl Heinz R on: 04/27/2021 10:10 AM   Modules accepted: Orders

## 2021-04-29 ENCOUNTER — Telehealth (HOSPITAL_COMMUNITY): Payer: Self-pay | Admitting: *Deleted

## 2021-04-29 NOTE — Telephone Encounter (Signed)
Left message on voicemail per DPR in reference to upcoming appointment scheduled on 05/05/21 with detailed instructions given per Myocardial Perfusion Study Information Sheet for the test. LM to arrive 15 minutes early, and that it is imperative to arrive on time for appointment to keep from having the test rescheduled. If you need to cancel or reschedule your appointment, please call the office within 24 hours of your appointment. Failure to do so may result in a cancellation of your appointment, and a $50 no show fee. Phone number given for call back for any questions. Kirstie Peri, RN

## 2021-05-05 ENCOUNTER — Ambulatory Visit (INDEPENDENT_AMBULATORY_CARE_PROVIDER_SITE_OTHER): Payer: Medicare PPO

## 2021-05-05 ENCOUNTER — Other Ambulatory Visit: Payer: Self-pay

## 2021-05-05 DIAGNOSIS — R079 Chest pain, unspecified: Secondary | ICD-10-CM | POA: Diagnosis not present

## 2021-05-05 DIAGNOSIS — R072 Precordial pain: Secondary | ICD-10-CM

## 2021-05-05 HISTORY — PX: CARDIOVASCULAR STRESS TEST: SHX262

## 2021-05-05 LAB — MYOCARDIAL PERFUSION IMAGING
Base ST Depression (mm): 0 mm
LV dias vol: 23 mL (ref 62–150)
LV sys vol: 81 mL
Nuc Stress EF: 71 %
Peak HR: 83 {beats}/min
Rest HR: 70 {beats}/min
Rest Nuclear Isotope Dose: 11 mCi
SDS: 3
SRS: 4
SSS: 7
ST Depression (mm): 0 mm
Stress Nuclear Isotope Dose: 33 mCi
TID: 0.95

## 2021-05-05 MED ORDER — TECHNETIUM TC 99M TETROFOSMIN IV KIT
33.0000 | PACK | Freq: Once | INTRAVENOUS | Status: AC | PRN
  Administered 2021-05-05: 33 via INTRAVENOUS

## 2021-05-05 MED ORDER — TECHNETIUM TC 99M TETROFOSMIN IV KIT
11.0000 | PACK | Freq: Once | INTRAVENOUS | Status: AC | PRN
Start: 2021-05-05 — End: 2021-05-05
  Administered 2021-05-05: 11 via INTRAVENOUS

## 2021-05-05 MED ORDER — REGADENOSON 0.4 MG/5ML IV SOLN
0.4000 mg | Freq: Once | INTRAVENOUS | Status: AC
Start: 2021-05-05 — End: 2021-05-05
  Administered 2021-05-05: 0.4 mg via INTRAVENOUS

## 2021-05-13 DIAGNOSIS — R000 Tachycardia, unspecified: Secondary | ICD-10-CM | POA: Diagnosis not present

## 2021-05-13 DIAGNOSIS — E1121 Type 2 diabetes mellitus with diabetic nephropathy: Secondary | ICD-10-CM | POA: Diagnosis not present

## 2021-05-13 DIAGNOSIS — M659 Synovitis and tenosynovitis, unspecified: Secondary | ICD-10-CM | POA: Diagnosis not present

## 2021-05-13 DIAGNOSIS — N400 Enlarged prostate without lower urinary tract symptoms: Secondary | ICD-10-CM | POA: Diagnosis not present

## 2021-05-13 DIAGNOSIS — M159 Polyosteoarthritis, unspecified: Secondary | ICD-10-CM | POA: Diagnosis not present

## 2021-05-13 DIAGNOSIS — N3281 Overactive bladder: Secondary | ICD-10-CM | POA: Diagnosis not present

## 2021-05-13 DIAGNOSIS — I10 Essential (primary) hypertension: Secondary | ICD-10-CM | POA: Diagnosis not present

## 2021-05-13 DIAGNOSIS — E785 Hyperlipidemia, unspecified: Secondary | ICD-10-CM | POA: Diagnosis not present

## 2021-05-13 DIAGNOSIS — Z794 Long term (current) use of insulin: Secondary | ICD-10-CM | POA: Diagnosis not present

## 2021-05-18 ENCOUNTER — Encounter: Payer: Medicare PPO | Admitting: Cardiology

## 2021-05-18 NOTE — Progress Notes (Deleted)
Electrophysiology Office Note   Date:  05/18/2021   ID:  Antonio Oliver, DOB [birth year], MRN [id number]  PCP:  Cher Nakai, MD  Cardiologist:  Revankar Primary Electrophysiologist:  Kathy Wahid Meredith Leeds, MD    Chief Complaint: pacemaker   History of Present Illness: Antonio Oliver is a 65 y.o. male who is being seen today for the evaluation of pacemaker at the request of Cher Nakai, MD. Presenting today for electrophysiology evaluation.  He has a past history significant for diabetes, hypertension, bicuspid aortic valve status post aortic valve replacement, and complete heart block.  He is status post [place] dual-chamber pacemaker implanted 03/15/2017.  Today, he denies*** symptoms of palpitations, chest pain, shortness of breath, orthopnea, PND, lower extremity edema, claudication, dizziness, presyncope, syncope, bleeding, or neurologic sequela. The patient is tolerating medications without difficulties.    Past Medical History:  Diagnosis Date   Anxiety 10/30/2018   Ascending aorta dilation ([place]) 03/06/2019   Back pain 01/05/2021   Benign prostatic hyperplasia without lower urinary tract symptoms 11/17/2016   Last Assessment & Plan:  Stable PSA today.  Formatting of this note might be different from the original. Last Assessment & Plan:  Stable PSA today. Last Assessment & Plan:  Formatting of this note might be different from the original. Stable PSA today.   Bicuspid aortic valve 12/22/2016   BPH (benign prostatic hyperplasia)    Bradycardia 02/01/2019   Cervical myelopathy ([place]) 11/14/2018   Charcot's joint of foot 03/05/2019   Chronic back pain 04/03/2013   Chronic bilateral low back pain 11/17/2016   Chronic inflammatory arthritis 11/17/2016   History of positive rheumatoid factor in the past. Denies placement on immunosuppressive therapy   Complete heart block ([place]) 05/04/2017   Depression    Diabetes mellitus due to underlying condition with unspecified complications  ([place]) [id number]   Diabetes mellitus type 2 in nonobese (HCC)    DM2 (diabetes mellitus, type 2) ([place]) 04/03/2013   Last Assessment & Plan:  Formatting of this note might be different from the original. Diabetes is unchanged.  Continue current treatment regimen. Regular aerobic exercise. Diabetes Wylie Russon be reassessed in 3 months. Shaianne Nucci check A1c today. Denies any problems with feet or sensation.   Enthesopathy of ankle and tarsus 04/04/2013   Last Assessment & Plan:  Continues with swelling after prolonged standing.  Still  Wearing support hose, which helps.   Essential hypertension 10/30/2018   Fatigue 10/30/2018   FHx: migraine headaches 04/04/2013   Fracture of capitate bone of wrist 02/26/2019   Fracture of tibia 03/05/2019   GERD (gastroesophageal reflux disease) 10/30/2018   Glaucoma 04/03/2013   Heart murmur 11/17/2016   Hemangioma of skin and subcutaneous tissue 07/07/2017   History of kidney stones 11/17/2016   Hyperlipidemia 10/30/2018   Hypertension    Hypothyroidism 10/30/2018   Lentigo 07/07/2017   Lesion of plantar nerve 04/04/2013   Leukocytosis    Lumbar stenosis with neurogenic claudication 01/08/2021   Migraine 11/17/2016   Mixed dyslipidemia 10/30/2018   Morbid obesity ([place]) 10/27/2011   Multiple actinic keratoses 07/07/2017   Multiple benign melanocytic nevi 07/07/2017   Neck pain 04/04/2013   Nondisplaced fracture of medial cuneiform of left foot, initial encounter for closed fracture 11/16/2018   Osteoarthritis 10/30/2018   Personal history of tobacco use, presenting hazards to health 04/04/2013   Presence of permanent cardiac pacemaker    S/P AVR (aortic valve replacement) 03/10/2017   Severe single current episode of major depressive disorder, without  psychotic features ([place]) 11/17/2016   Status post bariatric surgery 04/04/2013   Strain of rotator cuff 04/04/2013   Syncope 02/14/2019   Vitamin D deficiency 10/30/2018   Weakness    Past Surgical History:   Procedure Laterality Date   ANTERIOR CERVICAL DECOMP/DISCECTOMY FUSION N/A 11/17/2018   Procedure: CERVICAL THREE-CERVICAL FOUR, CERVICAL FOUR-CERVICAL FIVE, CERVICAL FIVE-CERVICAL SIX ANTERIOR CERVICAL DECOMPRESSION/DISCECTOMY FUSION;  Surgeon: Earnie Larsson, MD;  Location: [facility];  Service: Neurosurgery;  Laterality: N/A;   APPLICATION OF WOUND VAC Left 04/05/2019   Procedure: Application Of Wound Vac;  Surgeon: Erle Crocker, MD;  Location: [facility];  Service: Orthopedics;  Laterality: Left;   BACK SURGERY     x4   FASCIOTOMY Left 04/05/2019   Left leg 2 compartment fasciotomy   FASCIOTOMY Left 04/05/2019   Procedure: FASCIOTOMY LEFT LOWER LEG;  Surgeon: Erle Crocker, MD;  Location: [facility];  Service: Orthopedics;  Laterality: Left;   FOOT NEUROMA SURGERY     GASTRIC BYPASS     HEMATOMA EVACUATION Left 04/05/2019   Procedure: Evacuation Hematoma Left Lower Leg;  Surgeon: Erle Crocker, MD;  Location: [facility];  Service: Orthopedics;  Laterality: Left;   HEMORRHOID SURGERY     over 30 years ago   [place] umb   LUMBAR LAMINECTOMY/DECOMPRESSION MICRODISCECTOMY N/A 01/08/2021   Procedure: CENTRAL LUMBAR LAMINECTOMY LUMBAR TWO-THREE, LUMBAR THREE-FOUR;  Surgeon: Jessy Oto, MD;  Location: [facility];  Service: Orthopedics;  Laterality: N/A;   NASAL SINUS SURGERY     x2   NECK SURGERY       Current Outpatient Medications  Medication Sig Dispense Refill   aspirin EC 81 MG tablet Take 81 mg by mouth daily.     atorvastatin (LIPITOR) 20 MG tablet Take 20 mg by mouth daily at 6 PM.      brexpiprazole (REXULTI) 2 MG TABS tablet Take 2 mg by mouth daily.      buPROPion (WELLBUTRIN SR) 200 MG 12 hr tablet Take 200 mg by mouth 2 (two) times daily.     busPIRone (BUSPAR) 10 MG tablet Take 20 mg by mouth 2 (two) times daily.      calcium citrate (CALCITRATE - DOSED IN MG ELEMENTAL CALCIUM) 950 MG tablet Take 200 mg of elemental calcium by mouth 2 (two) times daily.      Cholecalciferol 125 MCG (5000 UT) TABS Take 125 mcg by mouth daily.     clonazePAM (KLONOPIN) 0.5 MG tablet Take 0.5 mg by mouth 2 (two) times daily as needed for anxiety.     diclofenac Sodium (VOLTAREN) 1 % GEL Apply 1 application topically 4 (four) times daily.     docusate sodium (COLACE) 100 MG capsule Take 1 capsule (100 mg total) by mouth 2 (two) times daily. 10 capsule 0   DULoxetine (CYMBALTA) 60 MG capsule Take 60 mg by mouth 2 (two) times daily.      fexofenadine (ALLEGRA) 180 MG tablet Take 180 mg by mouth daily as needed for allergies.     finasteride (PROSCAR) 5 MG tablet Take 5 mg by mouth daily.     fluticasone (FLONASE) 50 MCG/ACT nasal spray Place 2 sprays into the nose daily as needed for allergies or rhinitis.     folic acid (FOLVITE) 1 MG tablet Take 1 mg by mouth daily.     furosemide (LASIX) 20 MG tablet Take 1 tablet (20 mg total) by mouth as needed (for leg swelling). 30 tablet  4   gabapentin (NEURONTIN) 600 MG tablet Take 600-1,200 mg by mouth 3 (three) times daily.     Galcanezumab-gnlm (EMGALITY) 120 MG/ML SOAJ Inject 120 mg into the skin every 28 (twenty-eight) days. 1 mL 5   levothyroxine (SYNTHROID) 125 MCG tablet Take 125 mcg by mouth daily.      lisinopril (ZESTRIL) 2.5 MG tablet Take 2.5 mg by mouth as needed (high blood pressure).     Magnesium 500 MG CAPS Take 500 mg by mouth 2 (two) times a day.      metFORMIN (GLUCOPHAGE) 1000 MG tablet Take 1,000 mg by mouth 2 (two) times daily with a meal.     metoprolol succinate (TOPROL-XL) 50 MG 24 hr tablet Take 25 mg by mouth 2 (two) times daily.     mirtazapine (REMERON) 30 MG tablet Take 30 mg by mouth at bedtime.      Multiple Vitamins-Minerals (MULTIVITAMIN WITH MINERALS) tablet Take 1 tablet by mouth daily.     mupirocin ointment (BACTROBAN) 2 % Apply 1 application topically 3 (three) times daily as needed (infection).     naloxone (NARCAN) nasal spray 4 mg/0.1 mL Instill or spray into the nose if you are  experiencing shortness of breath or signs of opioid overdose. 1 each 0   omeprazole (PRILOSEC) 20 MG capsule Take 20 mg by mouth 2 (two) times daily before a meal.      oxyCODONE-acetaminophen (PERCOCET) 10-325 MG tablet Take 1 tablet by mouth 3 (three) times daily.     polyethylene glycol (MIRALAX / GLYCOLAX) 17 g packet Take 17 g by mouth daily. 14 each 0   potassium chloride SA (KLOR-CON) 20 MEQ tablet Take 20 mEq by mouth daily as needed (potassium defficiency). With lasix     Semaglutide (OZEMPIC, 0.25 OR 0.5 MG/DOSE, [place]) Inject 0.5 mg into the skin once a week.     solifenacin (VESICARE) 10 MG tablet Take 10 mg by mouth daily.     testosterone cypionate (DEPOTESTOSTERONE CYPIONATE) 200 MG/ML injection Inject 200 mg into the muscle every 30 (thirty) days. (Patient not taking: Reported on 04/22/2021)     tiZANidine (ZANAFLEX) 4 MG capsule Take 4-8 mg by mouth 3 (three) times daily as needed for muscle spasms.     traMADol (ULTRAM) 50 MG tablet Take 50 mg by mouth 3 (three) times daily.     Ubrogepant (UBRELVY) 100 MG TABS Take 1 tablet by mouth as needed (May repeat in 2 hours.  Maximum 2 tablets in 24 hours.). 16 tablet 5   No current facility-administered medications for this visit.    Allergies:   Morphine   Social History:  The patient  reports that he has never smoked. He has never used smokeless tobacco. He reports current alcohol use of about 1.0 standard drink per week. He reports that he does not use drugs.   Family History:  The patient's family history includes Anxiety disorder in his daughter; Cancer in his mother; Depression in his daughter; Diabetes in his brother, brother, father, and mother; Heart disease in his brother and father; Hypertension in his brother and brother; Kidney disease in his father; Osteoarthritis in his brother; Rheum arthritis in his brother and mother.    ROS:  Please see the history of present illness.   Otherwise, review of systems is positive for  none.   All other systems are reviewed and negative.    PHYSICAL EXAM: VS:  There were no vitals taken for this visit. , BMI There is  no height or weight on file to calculate BMI. GEN: Well nourished, well developed, in no acute distress  HEENT: normal  Neck: no JVD, carotid bruits, or masses Cardiac: ***RRR; no murmurs, rubs, or gallops,no edema  Respiratory:  clear to auscultation bilaterally, normal work of breathing GI: soft, nontender, nondistended, + BS MS: no deformity or atrophy  Skin: warm and dry, device pocket is well healed Neuro:  Strength and sensation are intact Psych: euthymic mood, full affect  EKG:  EKG {ACTION; IS/IS [id number] ordered today. Personal review of the ekg ordered *** shows ***  Device interrogation is reviewed today in detail.  See PaceArt for details.   Recent Labs: 01/06/2021: ALT 49 01/09/2021: BUN 14; Creatinine, Ser 1.00; Hemoglobin 14.3; Platelets 209; Potassium 5.1; Sodium 135    Lipid Panel     Component Value Date/Time   CHOL 161 07/12/2019 0910   TRIG 99 07/12/2019 0910   HDL 62 07/12/2019 0910   CHOLHDL 2.6 07/12/2019 0910   CHOLHDL 2.3 07/28/2011 1630   VLDL 24 07/28/2011 1630   LDLCALC 81 07/12/2019 0910     Wt Readings from Last 3 Encounters:  05/05/21 199 lb (90.3 kg)  04/22/21 199 lb 9.6 oz (90.5 kg)  04/17/21 203 lb 12.8 oz (92.4 kg)      Other studies Reviewed: Additional studies/ records that were reviewed today include: TTE 12/12/20  Review of the above records today demonstrates:   1. Left ventricular ejection fraction, by estimation, is 60 to 65%. The  left ventricle has normal function. The left ventricle has no regional  wall motion abnormalities. There is mild concentric left ventricular  hypertrophy. Left ventricular diastolic  parameters are indeterminate. The average left ventricular global  longitudinal strain is -8.9 %. The global longitudinal strain is abnormal.   2. Right ventricular systolic  function is normal. The right ventricular  size is normal. There is normal pulmonary artery systolic pressure.   3. The mitral valve is leaflets is thickened with suspicion for redundant  chordae. Moderate mitral valve regurgitation. No evidence of mitral  stenosis.   4. Well seated bioprosthetic aortic valve. Aortic valve regurgitation is  not visualized. No aortic stenosis is present.   5. Aneurysm of the ascending aorta, measuring 44 mm. The abdominal aorta  is dilated (2.5 cm).   6. The inferior vena cava is normal in size with greater than 50%  respiratory variability, suggesting right atrial pressure of 3 mmHg.  There is only 11  Myoview 05/05/2021   The study is normal. The study is low risk. There is no evidence of ischemia.   No ST deviation was noted.   There is normal wall motion.   Left ventricular function is normal.   ASSESSMENT AND PLAN:  1.  Complete heart block: Occurred post aortic valve replacement.  He is now status post [place] dual-chamber pacemaker implanted 03/15/2017.  Device functioning appropriately.  No changes at this time.  2.  Bicuspid aortic valve with severe stenosis: Status post aortic valve replacement.  Plan per primary cardiology.  3.  Hypertension:***  Current medicines are reviewed at length with the patient today.   The patient {ACTIONS; HAS/DOES NOT HAVE:19233} concerns regarding his medicines.  The following changes were made today:  {NONE DEFAULTED:18576}  Labs/ tests ordered today include: *** No orders of the defined types were placed in this encounter.    Disposition:   FU with Shahin Knierim {gen number [id number] {Days to years:10300}  Signed, Riyana Biel Meredith Leeds,  MD  05/18/2021 11:06 AM     [facility] [address] [place] [address] [phone] (office) [phone] (fax)

## 2021-05-26 DIAGNOSIS — E291 Testicular hypofunction: Secondary | ICD-10-CM | POA: Diagnosis not present

## 2021-05-26 DIAGNOSIS — I10 Essential (primary) hypertension: Secondary | ICD-10-CM | POA: Diagnosis not present

## 2021-05-26 DIAGNOSIS — N3281 Overactive bladder: Secondary | ICD-10-CM | POA: Diagnosis not present

## 2021-05-26 DIAGNOSIS — N400 Enlarged prostate without lower urinary tract symptoms: Secondary | ICD-10-CM | POA: Diagnosis not present

## 2021-05-26 DIAGNOSIS — Z794 Long term (current) use of insulin: Secondary | ICD-10-CM | POA: Diagnosis not present

## 2021-05-26 DIAGNOSIS — E785 Hyperlipidemia, unspecified: Secondary | ICD-10-CM | POA: Diagnosis not present

## 2021-05-26 DIAGNOSIS — M659 Synovitis and tenosynovitis, unspecified: Secondary | ICD-10-CM | POA: Diagnosis not present

## 2021-05-26 DIAGNOSIS — E1121 Type 2 diabetes mellitus with diabetic nephropathy: Secondary | ICD-10-CM | POA: Diagnosis not present

## 2021-05-26 DIAGNOSIS — R000 Tachycardia, unspecified: Secondary | ICD-10-CM | POA: Diagnosis not present

## 2021-06-09 DIAGNOSIS — N400 Enlarged prostate without lower urinary tract symptoms: Secondary | ICD-10-CM | POA: Diagnosis not present

## 2021-06-09 DIAGNOSIS — M159 Polyosteoarthritis, unspecified: Secondary | ICD-10-CM | POA: Diagnosis not present

## 2021-06-09 DIAGNOSIS — E1121 Type 2 diabetes mellitus with diabetic nephropathy: Secondary | ICD-10-CM | POA: Diagnosis not present

## 2021-06-09 DIAGNOSIS — I10 Essential (primary) hypertension: Secondary | ICD-10-CM | POA: Diagnosis not present

## 2021-06-09 DIAGNOSIS — Z794 Long term (current) use of insulin: Secondary | ICD-10-CM | POA: Diagnosis not present

## 2021-06-09 DIAGNOSIS — R000 Tachycardia, unspecified: Secondary | ICD-10-CM | POA: Diagnosis not present

## 2021-06-09 DIAGNOSIS — N3281 Overactive bladder: Secondary | ICD-10-CM | POA: Diagnosis not present

## 2021-06-09 DIAGNOSIS — E291 Testicular hypofunction: Secondary | ICD-10-CM | POA: Diagnosis not present

## 2021-06-09 DIAGNOSIS — E785 Hyperlipidemia, unspecified: Secondary | ICD-10-CM | POA: Diagnosis not present

## 2021-06-12 ENCOUNTER — Ambulatory Visit: Payer: Medicare PPO | Admitting: Cardiology

## 2021-06-23 DIAGNOSIS — E1121 Type 2 diabetes mellitus with diabetic nephropathy: Secondary | ICD-10-CM | POA: Diagnosis not present

## 2021-06-23 DIAGNOSIS — E785 Hyperlipidemia, unspecified: Secondary | ICD-10-CM | POA: Diagnosis not present

## 2021-06-23 DIAGNOSIS — R000 Tachycardia, unspecified: Secondary | ICD-10-CM | POA: Diagnosis not present

## 2021-06-23 DIAGNOSIS — N400 Enlarged prostate without lower urinary tract symptoms: Secondary | ICD-10-CM | POA: Diagnosis not present

## 2021-06-23 DIAGNOSIS — Z794 Long term (current) use of insulin: Secondary | ICD-10-CM | POA: Diagnosis not present

## 2021-06-23 DIAGNOSIS — E291 Testicular hypofunction: Secondary | ICD-10-CM | POA: Diagnosis not present

## 2021-06-23 DIAGNOSIS — I10 Essential (primary) hypertension: Secondary | ICD-10-CM | POA: Diagnosis not present

## 2021-06-23 DIAGNOSIS — F3341 Major depressive disorder, recurrent, in partial remission: Secondary | ICD-10-CM | POA: Diagnosis not present

## 2021-06-23 DIAGNOSIS — N3281 Overactive bladder: Secondary | ICD-10-CM | POA: Diagnosis not present

## 2021-07-03 ENCOUNTER — Other Ambulatory Visit: Payer: Self-pay

## 2021-07-03 ENCOUNTER — Ambulatory Visit: Payer: Medicare PPO | Admitting: Specialist

## 2021-07-03 ENCOUNTER — Encounter: Payer: Self-pay | Admitting: Specialist

## 2021-07-03 VITALS — BP 123/83 | HR 61 | Ht 75.0 in | Wt 199.0 lb

## 2021-07-03 DIAGNOSIS — M4326 Fusion of spine, lumbar region: Secondary | ICD-10-CM | POA: Diagnosis not present

## 2021-07-03 DIAGNOSIS — M5416 Radiculopathy, lumbar region: Secondary | ICD-10-CM | POA: Diagnosis not present

## 2021-07-03 DIAGNOSIS — Z9889 Other specified postprocedural states: Secondary | ICD-10-CM | POA: Diagnosis not present

## 2021-07-03 DIAGNOSIS — M47816 Spondylosis without myelopathy or radiculopathy, lumbar region: Secondary | ICD-10-CM | POA: Diagnosis not present

## 2021-07-03 NOTE — Patient Instructions (Signed)
Avoid bending, stooping and avoid lifting weights greater than 10 lbs. Avoid prolong standing and walking. Avoid frequent bending and stooping  No lifting greater than 10 lbs. May use ice or moist heat for pain. Weight loss is of benefit. Handicap license is approved. Pain management Dr. Hardin Negus, [facility] will call to arrange for evaluation for medical and  injection conservative management of lumbar DDD and spondylosis.

## 2021-07-03 NOTE — Progress Notes (Signed)
Office Visit Note   Patient: Antonio Oliver           Date of Birth: 1955/11/20           MRN: [id number] Visit Date: 07/03/2021              Requested by: Cher Nakai, MD [address] [place],  [place] [address] PCP: Cher Nakai, MD   Assessment & Plan: Visit Diagnoses:  1. Fusion of lumbar spine   2. Lumbar radiculopathy   3. Status post lumbar laminectomy     Plan: Follow-Avoid bending, stooping and avoid lifting weights greater than 10 lbs. Avoid prolong standing and walking. Avoid frequent bending and stooping  No lifting greater than 10 lbs. May use ice or moist heat for pain. Weight loss is of benefit. Handicap license is approved. Pain management Dr. Hardin Negus, [facility] will call to arrange for evaluation for medical and  injection conservative management of lumbar DDD and spondylosis.Up Instructions: Return in about 3 months (around 10/03/2021).   Orders:  No orders of the defined types were placed in this encounter.  No orders of the defined types were placed in this encounter.     Procedures: No procedures performed   Clinical Data: No additional findings.   Subjective: Chief Complaint  Patient presents with   Lower Back - Routine Post Op    65 year old male with history of lumbar spinal stenosis above lumbar fusion underwent laminectomy 01/2021 with improved standing and walking tolerance. He had a left foot drop and the leg remains weak though strength is improved. Taking Percocet 3 times 1/2 of a 10 mg tablet 6 times per day and tramadol. He relates that his wife would want him to try injections.   Review of Systems  Constitutional: Negative.   HENT: Negative.    Eyes: Negative.   Respiratory: Negative.    Cardiovascular: Negative.   Gastrointestinal: Negative.   Endocrine: Negative.   Genitourinary: Negative.   Musculoskeletal: Negative.   Skin: Negative.   Allergic/Immunologic: Negative.   Neurological: Negative.    Hematological: Negative.   Psychiatric/Behavioral: Negative.      Objective: Vital Signs: BP 123/83   Pulse 61   Ht 6\' 3"  (1.905 m)   Wt 199 lb (90.3 kg)   BMI 24.87 kg/m   Physical Exam Constitutional:      Appearance: He is well-developed.  HENT:     Head: Normocephalic and atraumatic.  Eyes:     Pupils: Pupils are equal, round, and reactive to light.  Pulmonary:     Effort: Pulmonary effort is normal.     Breath sounds: Normal breath sounds.  Abdominal:     General: Bowel sounds are normal.     Palpations: Abdomen is soft.  Musculoskeletal:        General: Normal range of motion.     Cervical back: Normal range of motion and neck supple.  Skin:    General: Skin is warm and dry.  Neurological:     Mental Status: He is alert and oriented to person, place, and time.  Psychiatric:        Behavior: Behavior normal.        Thought Content: Thought content normal.        Judgment: Judgment normal.    Ortho Exam  Specialty Comments:  No specialty comments available.  Imaging: No results found.   PMFS History: Patient Active Problem List   Diagnosis Date Noted  Palpitations 04/17/2021   DOE (dyspnea on exertion) 04/17/2021   Diabetes mellitus type 2 in nonobese ([place]) 04/16/2021   Lumbar stenosis with neurogenic claudication 01/08/2021   Back pain 01/05/2021   BPH (benign prostatic hyperplasia)    Depression    Hypertension    Presence of permanent cardiac pacemaker 12/28/2019   Ascending aorta dilation ([place]) 03/06/2019   Charcot's joint of foot 03/05/2019   Fracture of tibia 03/05/2019   Fracture of capitate bone of wrist 02/26/2019   Syncope 02/14/2019   Bradycardia 02/01/2019   Weakness    Leukocytosis    Nondisplaced fracture of medial cuneiform of left foot, initial encounter for closed fracture 11/16/2018   Cervical myelopathy ([place]) 11/14/2018   Hyperlipidemia 10/30/2018   Vitamin D deficiency 10/30/2018   Osteoarthritis 10/30/2018   GERD  (gastroesophageal reflux disease) 10/30/2018   Fatigue 10/30/2018   Hypothyroidism 10/30/2018   Essential hypertension 10/30/2018   Mixed dyslipidemia 10/30/2018   Diabetes mellitus due to underlying condition with unspecified complications ([place]) 69/67/8938   Hemangioma of skin and subcutaneous tissue 07/07/2017   Lentigo 07/07/2017   Multiple actinic keratoses 07/07/2017   Multiple benign melanocytic nevi 07/07/2017   Complete heart block ([place]) 05/04/2017   S/P AVR (aortic valve replacement) 03/10/2017   Bicuspid aortic valve 12/22/2016   Benign prostatic hyperplasia without lower urinary tract symptoms 11/17/2016   Chronic inflammatory arthritis 11/17/2016   Chronic bilateral low back pain 11/17/2016   Heart murmur 11/17/2016   History of kidney stones 11/17/2016   Migraine 11/17/2016   Anxiety 06/20/2013   Enthesopathy of ankle and tarsus 04/04/2013   Lesion of plantar nerve 04/04/2013   Personal history of tobacco use, presenting hazards to health 04/04/2013   Strain of rotator cuff 04/04/2013   Neck pain 04/04/2013   Status post bariatric surgery 04/04/2013   FHx: migraine headaches 04/04/2013   DM2 (diabetes mellitus, type 2) ([place]) 04/03/2013   Chronic back pain 04/03/2013   Glaucoma 04/03/2013   Morbid obesity ([place]) 10/27/2011   Past Medical History:  Diagnosis Date   Anxiety 10/30/2018   Ascending aorta dilation ([place]) 03/06/2019   Back pain 01/05/2021   Benign prostatic hyperplasia without lower urinary tract symptoms 11/17/2016   Last Assessment & Plan:  Stable PSA today.  Formatting of this note might be different from the original. Last Assessment & Plan:  Stable PSA today. Last Assessment & Plan:  Formatting of this note might be different from the original. Stable PSA today.   Bicuspid aortic valve 12/22/2016   BPH (benign prostatic hyperplasia)    Bradycardia 02/01/2019   Cervical myelopathy ([place]) 11/14/2018   Charcot's joint of foot 03/05/2019   Chronic back  pain 04/03/2013   Chronic bilateral low back pain 11/17/2016   Chronic inflammatory arthritis 11/17/2016   History of positive rheumatoid factor in the past. Denies placement on immunosuppressive therapy   Complete heart block ([place]) 05/04/2017   Depression    Diabetes mellitus due to underlying condition with unspecified complications ([place]) 06/22/5101   Diabetes mellitus type 2 in nonobese ([facility])    DM2 (diabetes mellitus, type 2) ([place]) 04/03/2013   Last Assessment & Plan:  Formatting of this note might be different from the original. Diabetes is unchanged.  Continue current treatment regimen. Regular aerobic exercise. Diabetes will be reassessed in 3 months. Will check A1c today. Denies any problems with feet or sensation.   Enthesopathy of ankle and tarsus 04/04/2013   Last Assessment & Plan:  Continues with swelling after  prolonged standing.  Still  Wearing support hose, which helps.   Essential hypertension 10/30/2018   Fatigue 10/30/2018   FHx: migraine headaches 04/04/2013   Fracture of capitate bone of wrist 02/26/2019   Fracture of tibia 03/05/2019   GERD (gastroesophageal reflux disease) 10/30/2018   Glaucoma 04/03/2013   Heart murmur 11/17/2016   Hemangioma of skin and subcutaneous tissue 07/07/2017   History of kidney stones 11/17/2016   Hyperlipidemia 10/30/2018   Hypertension    Hypothyroidism 10/30/2018   Lentigo 07/07/2017   Lesion of plantar nerve 04/04/2013   Leukocytosis    Lumbar stenosis with neurogenic claudication 01/08/2021   Migraine 11/17/2016   Mixed dyslipidemia 10/30/2018   Morbid obesity ([place]) 10/27/2011   Multiple actinic keratoses 07/07/2017   Multiple benign melanocytic nevi 07/07/2017   Neck pain 04/04/2013   Nondisplaced fracture of medial cuneiform of left foot, initial encounter for closed fracture 11/16/2018   Osteoarthritis 10/30/2018   Personal history of tobacco use, presenting hazards to health 04/04/2013   Presence of permanent cardiac  pacemaker    S/P AVR (aortic valve replacement) 03/10/2017   Severe single current episode of major depressive disorder, without psychotic features ([place]) 11/17/2016   Status post bariatric surgery 04/04/2013   Strain of rotator cuff 04/04/2013   Syncope 02/14/2019   Vitamin D deficiency 10/30/2018   Weakness     Family History  Problem Relation Age of Onset   Cancer Mother        breast   Rheum arthritis Mother    Diabetes Mother    Heart disease Father    Kidney disease Father    Diabetes Father    Hypertension Brother    Diabetes Brother    Osteoarthritis Brother    Heart disease Brother    Hypertension Brother    Diabetes Brother    Rheum arthritis Brother    Depression Daughter    Anxiety disorder Daughter     Past Surgical History:  Procedure Laterality Date   ANTERIOR CERVICAL DECOMP/DISCECTOMY FUSION N/A 11/17/2018   Procedure: CERVICAL THREE-CERVICAL FOUR, CERVICAL FOUR-CERVICAL FIVE, CERVICAL FIVE-CERVICAL SIX ANTERIOR CERVICAL DECOMPRESSION/DISCECTOMY FUSION;  Surgeon: Earnie Larsson, MD;  Location: [facility];  Service: Neurosurgery;  Laterality: N/A;   APPLICATION OF WOUND VAC Left 04/05/2019   Procedure: Application Of Wound Vac;  Surgeon: Erle Crocker, MD;  Location: [facility];  Service: Orthopedics;  Laterality: Left;   BACK SURGERY     x4   FASCIOTOMY Left 04/05/2019   Left leg 2 compartment fasciotomy   FASCIOTOMY Left 04/05/2019   Procedure: FASCIOTOMY LEFT LOWER LEG;  Surgeon: Erle Crocker, MD;  Location: [facility];  Service: Orthopedics;  Laterality: Left;   FOOT NEUROMA SURGERY     GASTRIC BYPASS     HEMATOMA EVACUATION Left 04/05/2019   Procedure: Evacuation Hematoma Left Lower Leg;  Surgeon: Erle Crocker, MD;  Location: [facility];  Service: Orthopedics;  Laterality: Left;   HEMORRHOID SURGERY     over 30 years ago   [place] umb   LUMBAR LAMINECTOMY/DECOMPRESSION MICRODISCECTOMY N/A 01/08/2021   Procedure: CENTRAL LUMBAR LAMINECTOMY  LUMBAR TWO-THREE, LUMBAR THREE-FOUR;  Surgeon: Jessy Oto, MD;  Location: [facility];  Service: Orthopedics;  Laterality: N/A;   NASAL SINUS SURGERY     x2   NECK SURGERY     Social History   Occupational History   Not on file  Tobacco Use   Smoking status: Never   Smokeless tobacco:  Never  Vaping Use   Vaping Use: Never used  Substance and Sexual Activity   Alcohol use: Yes    Alcohol/week: 1.0 standard drink    Types: 1 Glasses of wine per week    Comment: occ.   Drug use: No   Sexual activity: Not on file

## 2021-07-08 DIAGNOSIS — E785 Hyperlipidemia, unspecified: Secondary | ICD-10-CM | POA: Diagnosis not present

## 2021-07-08 DIAGNOSIS — N400 Enlarged prostate without lower urinary tract symptoms: Secondary | ICD-10-CM | POA: Diagnosis not present

## 2021-07-08 DIAGNOSIS — E1121 Type 2 diabetes mellitus with diabetic nephropathy: Secondary | ICD-10-CM | POA: Diagnosis not present

## 2021-07-08 DIAGNOSIS — Z794 Long term (current) use of insulin: Secondary | ICD-10-CM | POA: Diagnosis not present

## 2021-07-08 DIAGNOSIS — I10 Essential (primary) hypertension: Secondary | ICD-10-CM | POA: Diagnosis not present

## 2021-07-08 DIAGNOSIS — E291 Testicular hypofunction: Secondary | ICD-10-CM | POA: Diagnosis not present

## 2021-07-08 DIAGNOSIS — N3281 Overactive bladder: Secondary | ICD-10-CM | POA: Diagnosis not present

## 2021-07-08 DIAGNOSIS — M159 Polyosteoarthritis, unspecified: Secondary | ICD-10-CM | POA: Diagnosis not present

## 2021-07-08 DIAGNOSIS — F3341 Major depressive disorder, recurrent, in partial remission: Secondary | ICD-10-CM | POA: Diagnosis not present

## 2021-07-13 ENCOUNTER — Ambulatory Visit (INDEPENDENT_AMBULATORY_CARE_PROVIDER_SITE_OTHER): Payer: Medicare PPO

## 2021-07-13 DIAGNOSIS — I442 Atrioventricular block, complete: Secondary | ICD-10-CM | POA: Diagnosis not present

## 2021-07-14 LAB — CUP PACEART REMOTE DEVICE CHECK
Battery Remaining Longevity: 87 mo
Battery Remaining Percentage: 65 %
Battery Voltage: 3.02 V
Brady Statistic AP VP Percent: 1 %
Brady Statistic AP VS Percent: 2 %
Brady Statistic AS VP Percent: 1 %
Brady Statistic AS VS Percent: 98 %
Brady Statistic RA Percent Paced: 1.8 %
Brady Statistic RV Percent Paced: 1 %
Date Time Interrogation Session: 20221107035507
Implantable Lead Implant Date: 20180710
Implantable Lead Implant Date: 20180710
Implantable Lead Location: 753859
Implantable Lead Location: 753860
Implantable Pulse Generator Implant Date: 20180710
Lead Channel Impedance Value: 450 Ohm
Lead Channel Impedance Value: 490 Ohm
Lead Channel Pacing Threshold Amplitude: 0.5 V
Lead Channel Pacing Threshold Amplitude: 0.875 V
Lead Channel Pacing Threshold Pulse Width: 0.4 ms
Lead Channel Pacing Threshold Pulse Width: 0.4 ms
Lead Channel Sensing Intrinsic Amplitude: 1 mV
Lead Channel Sensing Intrinsic Amplitude: 9.7 mV
Lead Channel Setting Pacing Amplitude: 1.125
Lead Channel Setting Pacing Amplitude: 2 V
Lead Channel Setting Pacing Pulse Width: 0.4 ms
Lead Channel Setting Sensing Sensitivity: 2 mV
Pulse Gen Model: 2272
Pulse Gen Serial Number: 8912213

## 2021-07-17 DIAGNOSIS — I10 Essential (primary) hypertension: Secondary | ICD-10-CM | POA: Diagnosis not present

## 2021-07-17 DIAGNOSIS — N3281 Overactive bladder: Secondary | ICD-10-CM | POA: Diagnosis not present

## 2021-07-17 DIAGNOSIS — Z794 Long term (current) use of insulin: Secondary | ICD-10-CM | POA: Diagnosis not present

## 2021-07-17 DIAGNOSIS — M659 Synovitis and tenosynovitis, unspecified: Secondary | ICD-10-CM | POA: Diagnosis not present

## 2021-07-17 DIAGNOSIS — E785 Hyperlipidemia, unspecified: Secondary | ICD-10-CM | POA: Diagnosis not present

## 2021-07-17 DIAGNOSIS — E291 Testicular hypofunction: Secondary | ICD-10-CM | POA: Diagnosis not present

## 2021-07-17 DIAGNOSIS — F3341 Major depressive disorder, recurrent, in partial remission: Secondary | ICD-10-CM | POA: Diagnosis not present

## 2021-07-17 DIAGNOSIS — N400 Enlarged prostate without lower urinary tract symptoms: Secondary | ICD-10-CM | POA: Diagnosis not present

## 2021-07-17 DIAGNOSIS — E1121 Type 2 diabetes mellitus with diabetic nephropathy: Secondary | ICD-10-CM | POA: Diagnosis not present

## 2021-07-17 NOTE — Progress Notes (Signed)
Remote pacemaker transmission.   

## 2021-07-23 DIAGNOSIS — Z79899 Other long term (current) drug therapy: Secondary | ICD-10-CM | POA: Diagnosis not present

## 2021-07-23 DIAGNOSIS — Z950 Presence of cardiac pacemaker: Secondary | ICD-10-CM | POA: Diagnosis not present

## 2021-07-23 DIAGNOSIS — M545 Low back pain, unspecified: Secondary | ICD-10-CM | POA: Diagnosis not present

## 2021-07-23 DIAGNOSIS — M542 Cervicalgia: Secondary | ICD-10-CM | POA: Diagnosis not present

## 2021-08-05 DIAGNOSIS — I10 Essential (primary) hypertension: Secondary | ICD-10-CM | POA: Diagnosis not present

## 2021-08-05 DIAGNOSIS — E785 Hyperlipidemia, unspecified: Secondary | ICD-10-CM | POA: Diagnosis not present

## 2021-08-05 DIAGNOSIS — N400 Enlarged prostate without lower urinary tract symptoms: Secondary | ICD-10-CM | POA: Diagnosis not present

## 2021-08-05 DIAGNOSIS — F3341 Major depressive disorder, recurrent, in partial remission: Secondary | ICD-10-CM | POA: Diagnosis not present

## 2021-08-05 DIAGNOSIS — Z794 Long term (current) use of insulin: Secondary | ICD-10-CM | POA: Diagnosis not present

## 2021-08-05 DIAGNOSIS — E291 Testicular hypofunction: Secondary | ICD-10-CM | POA: Diagnosis not present

## 2021-08-05 DIAGNOSIS — N3281 Overactive bladder: Secondary | ICD-10-CM | POA: Diagnosis not present

## 2021-08-05 DIAGNOSIS — M159 Polyosteoarthritis, unspecified: Secondary | ICD-10-CM | POA: Diagnosis not present

## 2021-08-05 DIAGNOSIS — E1121 Type 2 diabetes mellitus with diabetic nephropathy: Secondary | ICD-10-CM | POA: Diagnosis not present

## 2021-08-06 DIAGNOSIS — M5416 Radiculopathy, lumbar region: Secondary | ICD-10-CM | POA: Diagnosis not present

## 2021-08-06 DIAGNOSIS — M5412 Radiculopathy, cervical region: Secondary | ICD-10-CM | POA: Diagnosis not present

## 2021-08-06 DIAGNOSIS — M961 Postlaminectomy syndrome, not elsewhere classified: Secondary | ICD-10-CM | POA: Diagnosis not present

## 2021-08-06 DIAGNOSIS — M5442 Lumbago with sciatica, left side: Secondary | ICD-10-CM | POA: Diagnosis not present

## 2021-08-06 DIAGNOSIS — G894 Chronic pain syndrome: Secondary | ICD-10-CM | POA: Diagnosis not present

## 2021-08-06 DIAGNOSIS — M5441 Lumbago with sciatica, right side: Secondary | ICD-10-CM | POA: Diagnosis not present

## 2021-08-11 ENCOUNTER — Other Ambulatory Visit (HOSPITAL_COMMUNITY): Payer: Self-pay | Admitting: Pain Medicine

## 2021-08-11 DIAGNOSIS — M961 Postlaminectomy syndrome, not elsewhere classified: Secondary | ICD-10-CM

## 2021-09-02 DIAGNOSIS — E785 Hyperlipidemia, unspecified: Secondary | ICD-10-CM | POA: Diagnosis not present

## 2021-09-02 DIAGNOSIS — Z794 Long term (current) use of insulin: Secondary | ICD-10-CM | POA: Diagnosis not present

## 2021-09-02 DIAGNOSIS — N3281 Overactive bladder: Secondary | ICD-10-CM | POA: Diagnosis not present

## 2021-09-02 DIAGNOSIS — E291 Testicular hypofunction: Secondary | ICD-10-CM | POA: Diagnosis not present

## 2021-09-02 DIAGNOSIS — I10 Essential (primary) hypertension: Secondary | ICD-10-CM | POA: Diagnosis not present

## 2021-09-02 DIAGNOSIS — L0390 Cellulitis, unspecified: Secondary | ICD-10-CM | POA: Diagnosis not present

## 2021-09-02 DIAGNOSIS — N400 Enlarged prostate without lower urinary tract symptoms: Secondary | ICD-10-CM | POA: Diagnosis not present

## 2021-09-02 DIAGNOSIS — E1121 Type 2 diabetes mellitus with diabetic nephropathy: Secondary | ICD-10-CM | POA: Diagnosis not present

## 2021-09-02 DIAGNOSIS — M159 Polyosteoarthritis, unspecified: Secondary | ICD-10-CM | POA: Diagnosis not present

## 2021-09-02 DIAGNOSIS — E039 Hypothyroidism, unspecified: Secondary | ICD-10-CM | POA: Diagnosis not present

## 2021-09-06 HISTORY — PX: OLECRANON BURSECTOMY: SHX2097

## 2021-09-30 DIAGNOSIS — Z794 Long term (current) use of insulin: Secondary | ICD-10-CM | POA: Diagnosis not present

## 2021-09-30 DIAGNOSIS — E785 Hyperlipidemia, unspecified: Secondary | ICD-10-CM | POA: Diagnosis not present

## 2021-09-30 DIAGNOSIS — Z0000 Encounter for general adult medical examination without abnormal findings: Secondary | ICD-10-CM | POA: Diagnosis not present

## 2021-09-30 DIAGNOSIS — M159 Polyosteoarthritis, unspecified: Secondary | ICD-10-CM | POA: Diagnosis not present

## 2021-09-30 DIAGNOSIS — Z1211 Encounter for screening for malignant neoplasm of colon: Secondary | ICD-10-CM | POA: Diagnosis not present

## 2021-09-30 DIAGNOSIS — E1121 Type 2 diabetes mellitus with diabetic nephropathy: Secondary | ICD-10-CM | POA: Diagnosis not present

## 2021-09-30 DIAGNOSIS — Z1331 Encounter for screening for depression: Secondary | ICD-10-CM | POA: Diagnosis not present

## 2021-09-30 DIAGNOSIS — Z9181 History of falling: Secondary | ICD-10-CM | POA: Diagnosis not present

## 2021-09-30 DIAGNOSIS — I10 Essential (primary) hypertension: Secondary | ICD-10-CM | POA: Diagnosis not present

## 2021-10-06 ENCOUNTER — Other Ambulatory Visit (HOSPITAL_COMMUNITY): Payer: Self-pay | Admitting: Pain Medicine

## 2021-10-06 ENCOUNTER — Ambulatory Visit (HOSPITAL_COMMUNITY): Payer: Medicare PPO

## 2021-10-06 DIAGNOSIS — M5412 Radiculopathy, cervical region: Secondary | ICD-10-CM

## 2021-10-08 ENCOUNTER — Telehealth: Payer: Self-pay | Admitting: Cardiology

## 2021-10-08 NOTE — Telephone Encounter (Signed)
° °  Pt requesting to switch from Dr. Geraldo Pitter to Dr. [name] due to location, pt moved and they're closer to [facility] office

## 2021-10-15 ENCOUNTER — Encounter: Payer: Self-pay | Admitting: Cardiology

## 2021-10-15 NOTE — Progress Notes (Signed)
HPI: Follow-up history of aortic valve replacement and thoracic aortic aneurysm.  Previously followed by Dr. Geraldo Pitter.  Patient has a history of bicuspid aortic valve and underwent aortic valve replacement March 10, 2017.  Procedure was complicated by complete heart block requiring pacemaker placement.  Note preoperative cardiac catheterization revealed no coronary disease.  Echocardiogram April 2022 showed normal LV function, mild left ventricular hypertrophy, moderate mitral regurgitation, previous aortic valve replacement, and ascending aorta aneurysm at 44 mm.  Chest CT April 2022 showed 4.3 cm thoracic aortic aneurysm.  Nuclear study August 2022 showed no ischemia.  Since last seen he denies dyspnea, chest pain, palpitations or syncope.  Current Outpatient Medications  Medication Sig Dispense Refill   aspirin EC 81 MG tablet Take 81 mg by mouth daily.     atorvastatin (LIPITOR) 20 MG tablet Take 20 mg by mouth daily at 6 PM.      brexpiprazole (REXULTI) 2 MG TABS tablet Take 2 mg by mouth daily.      buPROPion (WELLBUTRIN SR) 200 MG 12 hr tablet Take 200 mg by mouth 2 (two) times daily.     busPIRone (BUSPAR) 10 MG tablet Take 20 mg by mouth 2 (two) times daily.      calcium citrate (CALCITRATE - DOSED IN MG ELEMENTAL CALCIUM) 950 MG tablet Take 200 mg of elemental calcium by mouth 2 (two) times daily.     Cholecalciferol 125 MCG (5000 UT) TABS Take 125 mcg by mouth daily.     clonazePAM (KLONOPIN) 0.5 MG tablet Take 0.5 mg by mouth 2 (two) times daily as needed for anxiety.     diclofenac Sodium (VOLTAREN) 1 % GEL Apply 1 application topically 4 (four) times daily.     docusate sodium (COLACE) 100 MG capsule Take 1 capsule (100 mg total) by mouth 2 (two) times daily. 10 capsule 0   DULoxetine (CYMBALTA) 60 MG capsule Take 60 mg by mouth 2 (two) times daily.      fexofenadine (ALLEGRA) 180 MG tablet Take 180 mg by mouth daily as needed for allergies.     finasteride (PROSCAR) 5 MG tablet  Take 5 mg by mouth daily.     fluticasone (FLONASE) 50 MCG/ACT nasal spray Place 2 sprays into the nose daily as needed for allergies or rhinitis.     folic acid (FOLVITE) 1 MG tablet Take 1 mg by mouth daily.     furosemide (LASIX) 20 MG tablet Take 1 tablet (20 mg total) by mouth as needed (for leg swelling). 30 tablet 4   gabapentin (NEURONTIN) 600 MG tablet Take 600-1,200 mg by mouth 3 (three) times daily.     Galcanezumab-gnlm (EMGALITY) 120 MG/ML SOAJ Inject 120 mg into the skin every 28 (twenty-eight) days. 1 mL 5   levothyroxine (SYNTHROID) 125 MCG tablet Take 125 mcg by mouth daily.      lisinopril (ZESTRIL) 2.5 MG tablet Take 2.5 mg by mouth as needed (high blood pressure).     Magnesium 500 MG CAPS Take 500 mg by mouth 2 (two) times a day.      metFORMIN (GLUCOPHAGE) 1000 MG tablet Take 1,000 mg by mouth 2 (two) times daily with a meal.     metoprolol succinate (TOPROL-XL) 50 MG 24 hr tablet Take 25 mg by mouth 2 (two) times daily.     mirtazapine (REMERON) 30 MG tablet Take 30 mg by mouth at bedtime.      Multiple Vitamins-Minerals (MULTIVITAMIN WITH MINERALS) tablet Take 1 tablet  by mouth daily.     mupirocin ointment (BACTROBAN) 2 % Apply 1 application topically 3 (three) times daily as needed (infection).     naloxone (NARCAN) nasal spray 4 mg/0.1 mL Instill or spray into the nose if you are experiencing shortness of breath or signs of opioid overdose. 1 each 0   omeprazole (PRILOSEC) 20 MG capsule Take 20 mg by mouth 2 (two) times daily before a meal.      oxyCODONE-acetaminophen (PERCOCET) 10-325 MG tablet Take 1 tablet by mouth 3 (three) times daily.     polyethylene glycol (MIRALAX / GLYCOLAX) 17 g packet Take 17 g by mouth daily. 14 each 0   potassium chloride SA (KLOR-CON) 20 MEQ tablet Take 20 mEq by mouth daily as needed (potassium defficiency). With lasix     Semaglutide (OZEMPIC, 0.25 OR 0.5 MG/DOSE, [place]) Inject 0.5 mg into the skin once a week.     solifenacin (VESICARE)  10 MG tablet Take 10 mg by mouth daily.     testosterone cypionate (DEPOTESTOSTERONE CYPIONATE) 200 MG/ML injection Inject 200 mg into the muscle every 30 (thirty) days.     tiZANidine (ZANAFLEX) 4 MG capsule Take 4-8 mg by mouth 3 (three) times daily as needed for muscle spasms.     traMADol (ULTRAM) 50 MG tablet Take 50 mg by mouth 3 (three) times daily.     Ubrogepant (UBRELVY) 100 MG TABS Take 1 tablet by mouth as needed (May repeat in 2 hours.  Maximum 2 tablets in 24 hours.). 16 tablet 5   No current facility-administered medications for this visit.     Past Medical History:  Diagnosis Date   Anxiety 10/30/2018   Ascending aorta dilation ([place]) 03/06/2019   Benign prostatic hyperplasia without lower urinary tract symptoms 11/17/2016   Last Assessment & Plan:  Stable PSA today.  Formatting of this note might be different from the original. Last Assessment & Plan:  Stable PSA today. Last Assessment & Plan:  Formatting of this note might be different from the original. Stable PSA today.   Bicuspid aortic valve 12/22/2016   BPH (benign prostatic hyperplasia)    Bradycardia 02/01/2019   Cervical myelopathy ([place]) 11/14/2018   Charcot's joint of foot 03/05/2019   Chronic back pain 04/03/2013   Chronic bilateral low back pain 11/17/2016   Chronic inflammatory arthritis 11/17/2016   History of positive rheumatoid factor in the past. Denies placement on immunosuppressive therapy   Complete heart block ([place]) 05/04/2017   Depression    DM2 (diabetes mellitus, type 2) ([place]) 04/03/2013   Last Assessment & Plan:  Formatting of this note might be different from the original. Diabetes is unchanged.  Continue current treatment regimen. Regular aerobic exercise. Diabetes will be reassessed in 3 months. Will check A1c today. Denies any problems with feet or sensation.   Enthesopathy of ankle and tarsus 04/04/2013   Last Assessment & Plan:  Continues with swelling after prolonged standing.  Still   Wearing support hose, which helps.   Essential hypertension 10/30/2018   FHx: migraine headaches 04/04/2013   Fracture of capitate bone of wrist 02/26/2019   Fracture of tibia 03/05/2019   GERD (gastroesophageal reflux disease) 10/30/2018   Glaucoma 04/03/2013   Hemangioma of skin and subcutaneous tissue 07/07/2017   History of kidney stones 11/17/2016   Hyperlipidemia 10/30/2018   Hypothyroidism 10/30/2018   Lentigo 07/07/2017   Lesion of plantar nerve 04/04/2013   Lumbar stenosis with neurogenic claudication 01/08/2021   Migraine 11/17/2016   Mixed  dyslipidemia 10/30/2018   Morbid obesity ([place]) 10/27/2011   Multiple actinic keratoses 07/07/2017   Multiple benign melanocytic nevi 07/07/2017   Nondisplaced fracture of medial cuneiform of left foot, initial encounter for closed fracture 11/16/2018   Osteoarthritis 10/30/2018   Personal history of tobacco use, presenting hazards to health 04/04/2013   Presence of permanent cardiac pacemaker    S/P AVR (aortic valve replacement) 03/10/2017   Severe single current episode of major depressive disorder, without psychotic features ([place]) 11/17/2016   Status post bariatric surgery 04/04/2013   Strain of rotator cuff 04/04/2013   Syncope 02/14/2019   Vitamin D deficiency 10/30/2018    Past Surgical History:  Procedure Laterality Date   ANTERIOR CERVICAL DECOMP/DISCECTOMY FUSION N/A 11/17/2018   Procedure: CERVICAL THREE-CERVICAL FOUR, CERVICAL FOUR-CERVICAL FIVE, CERVICAL FIVE-CERVICAL SIX ANTERIOR CERVICAL DECOMPRESSION/DISCECTOMY FUSION;  Surgeon: Earnie Larsson, MD;  Location: [facility];  Service: Neurosurgery;  Laterality: N/A;   APPLICATION OF WOUND VAC Left 04/05/2019   Procedure: Application Of Wound Vac;  Surgeon: Erle Crocker, MD;  Location: [facility];  Service: Orthopedics;  Laterality: Left;   BACK SURGERY     x4   FASCIOTOMY Left 04/05/2019   Left leg 2 compartment fasciotomy   FASCIOTOMY Left 04/05/2019   Procedure: FASCIOTOMY  LEFT LOWER LEG;  Surgeon: Erle Crocker, MD;  Location: [facility];  Service: Orthopedics;  Laterality: Left;   FOOT NEUROMA SURGERY     GASTRIC BYPASS     HEMATOMA EVACUATION Left 04/05/2019   Procedure: Evacuation Hematoma Left Lower Leg;  Surgeon: Erle Crocker, MD;  Location: [facility];  Service: Orthopedics;  Laterality: Left;   HEMORRHOID SURGERY     over 30 years ago   [place] umb   LUMBAR LAMINECTOMY/DECOMPRESSION MICRODISCECTOMY N/A 01/08/2021   Procedure: CENTRAL LUMBAR LAMINECTOMY LUMBAR TWO-THREE, LUMBAR THREE-FOUR;  Surgeon: Jessy Oto, MD;  Location: [facility];  Service: Orthopedics;  Laterality: N/A;   NASAL SINUS SURGERY     x2   NECK SURGERY      Social History   Socioeconomic History   Marital status: Married    Spouse name: susan    Number of children: 1   Years of education: Not on file   Highest education level: Bachelor's degree (e.g., BA, AB, BS)  Occupational History   Not on file  Tobacco Use   Smoking status: Never   Smokeless tobacco: Never  Vaping Use   Vaping Use: Never used  Substance and Sexual Activity   Alcohol use: Yes    Alcohol/week: 1.0 standard drink    Types: 1 Glasses of wine per week    Comment: occ.   Drug use: No   Sexual activity: Not on file  Other Topics Concern   Not on file  Social History Narrative   Social Review      Patient lives at home with. Spouse [name]   Pt lives in a one or two story home? TWO Story patient lives on first story of home   Does patient lives in a facility? If so where? Private home   What is patient highest level of education? Bachelors degree   Is patient RIGHT or LEFT handed? Right Hand   Does patient drink coffee, tea, soda? Coffee, Tea   How much coffee, tea, soda does patient drink? Coffee in AM, Tea off and on all day   Does patient exercise regularly? No    Social Determinants of Health  Financial Resource Strain: Not on file  Food Insecurity: Not on file   Transportation Needs: Not on file  Physical Activity: Not on file  Stress: Not on file  Social Connections: Not on file  Intimate Partner Violence: Not on file    Family History  Problem Relation Age of Onset   Cancer Mother        breast   Rheum arthritis Mother    Diabetes Mother    Heart disease Father    Kidney disease Father    Diabetes Father    Hypertension Brother    Diabetes Brother    Osteoarthritis Brother    Heart disease Brother    Hypertension Brother    Diabetes Brother    Rheum arthritis Brother    Depression Daughter    Anxiety disorder Daughter     ROS: Arthralgias but no fevers or chills, productive cough, hemoptysis, dysphasia, odynophagia, melena, hematochezia, dysuria, hematuria, rash, seizure activity, orthopnea, PND, pedal edema, claudication. Remaining systems are negative.  Physical Exam: Well-developed well-nourished in no acute distress.  Skin is warm and dry.  HEENT is normal.  Neck is supple.  Chest is clear to auscultation with normal expansion.  Cardiovascular exam is regular rate and rhythm.  2/6 systolic murmur left sternal border. Abdominal exam nontender or distended. No masses palpated. Extremities show no edema. neuro grossly intact  A/P  1 status post aortic valve replacement-most recent echocardiogram shows stable aortic valve with no aortic insufficiency.  Continue SBE prophylaxis.  2 moderate mitral regurgitation-we will plan to repeat echocardiogram April 2022.  3 thoracic aortic aneurysm-plan follow-up CTA April 2022.  4 hypertension-patient's blood pressure is elevated; he has difficulties with labile blood pressure at home.  He will take 5 mg of lisinopril and his blood pressure will decrease to 100/70 and he has dizziness.  His wife will have to discontinue his medication for 2 to 3 days.  We will try losartan 25 mg daily instead of lisinopril given thoracic aortic aneurysm.  Follow blood pressure and adjust medications  as needed.  5 status post pacemaker-we will arrange follow-up with electrophysiology.  6 hyperlipidemia-continue statin.  7 Palpitations-continue beta-blocker.  Kirk Ruths, MD

## 2021-10-18 DIAGNOSIS — Z1211 Encounter for screening for malignant neoplasm of colon: Secondary | ICD-10-CM | POA: Diagnosis not present

## 2021-10-18 DIAGNOSIS — Z1212 Encounter for screening for malignant neoplasm of rectum: Secondary | ICD-10-CM | POA: Diagnosis not present

## 2021-10-19 ENCOUNTER — Ambulatory Visit (HOSPITAL_COMMUNITY)
Admission: RE | Admit: 2021-10-19 | Discharge: 2021-10-19 | Disposition: A | Discharge status: Home / Self Care | Payer: Medicare PPO | Source: Ambulatory Visit | Attending: Pain Medicine | Admitting: Pain Medicine

## 2021-10-19 ENCOUNTER — Ambulatory Visit (INDEPENDENT_AMBULATORY_CARE_PROVIDER_SITE_OTHER): Payer: Medicare PPO

## 2021-10-19 ENCOUNTER — Other Ambulatory Visit: Payer: Self-pay

## 2021-10-19 ENCOUNTER — Encounter (HOSPITAL_COMMUNITY): Payer: Self-pay

## 2021-10-19 ENCOUNTER — Ambulatory Visit (HOSPITAL_COMMUNITY): Payer: Medicare PPO

## 2021-10-19 ENCOUNTER — Ambulatory Visit (HOSPITAL_COMMUNITY)
Admission: RE | Admit: 2021-10-19 | Discharge: 2021-10-19 | Disposition: A | Discharge status: Home / Self Care | Payer: Medicare PPO | Source: Ambulatory Visit | Attending: Physician Assistant | Admitting: Physician Assistant

## 2021-10-19 DIAGNOSIS — I442 Atrioventricular block, complete: Secondary | ICD-10-CM | POA: Diagnosis not present

## 2021-10-19 DIAGNOSIS — M961 Postlaminectomy syndrome, not elsewhere classified: Secondary | ICD-10-CM | POA: Diagnosis not present

## 2021-10-19 DIAGNOSIS — S2220XA Unspecified fracture of sternum, initial encounter for closed fracture: Secondary | ICD-10-CM | POA: Diagnosis not present

## 2021-10-19 DIAGNOSIS — M5124 Other intervertebral disc displacement, thoracic region: Secondary | ICD-10-CM | POA: Diagnosis not present

## 2021-10-19 DIAGNOSIS — Z950 Presence of cardiac pacemaker: Secondary | ICD-10-CM | POA: Diagnosis not present

## 2021-10-19 DIAGNOSIS — M4802 Spinal stenosis, cervical region: Secondary | ICD-10-CM | POA: Diagnosis not present

## 2021-10-19 DIAGNOSIS — M5412 Radiculopathy, cervical region: Secondary | ICD-10-CM

## 2021-10-19 DIAGNOSIS — M4804 Spinal stenosis, thoracic region: Secondary | ICD-10-CM | POA: Diagnosis not present

## 2021-10-19 DIAGNOSIS — M4724 Other spondylosis with radiculopathy, thoracic region: Secondary | ICD-10-CM | POA: Diagnosis not present

## 2021-10-19 DIAGNOSIS — M4316 Spondylolisthesis, lumbar region: Secondary | ICD-10-CM | POA: Diagnosis not present

## 2021-10-19 DIAGNOSIS — M4326 Fusion of spine, lumbar region: Secondary | ICD-10-CM | POA: Diagnosis not present

## 2021-10-19 DIAGNOSIS — M4312 Spondylolisthesis, cervical region: Secondary | ICD-10-CM | POA: Diagnosis not present

## 2021-10-19 DIAGNOSIS — M4328 Fusion of spine, sacral and sacrococcygeal region: Secondary | ICD-10-CM | POA: Diagnosis not present

## 2021-10-19 DIAGNOSIS — R200 Anesthesia of skin: Secondary | ICD-10-CM | POA: Diagnosis not present

## 2021-10-19 LAB — CUP PACEART REMOTE DEVICE CHECK
Battery Remaining Longevity: 82 mo
Battery Remaining Percentage: 63 %
Battery Voltage: 3.02 V
Brady Statistic AP VP Percent: 0 %
Brady Statistic AP VS Percent: 13 %
Brady Statistic AS VP Percent: 15 %
Brady Statistic AS VS Percent: 71 %
Brady Statistic RA Percent Paced: 12 %
Brady Statistic RV Percent Paced: 15 %
Date Time Interrogation Session: 20230213130953
Implantable Lead Implant Date: 20180710
Implantable Lead Implant Date: 20180710
Implantable Lead Location: 753859
Implantable Lead Location: 753860
Implantable Pulse Generator Implant Date: 20180710
Lead Channel Impedance Value: 460 Ohm
Lead Channel Impedance Value: 480 Ohm
Lead Channel Pacing Threshold Amplitude: 0.5 V
Lead Channel Pacing Threshold Amplitude: 0.875 V
Lead Channel Pacing Threshold Pulse Width: 0.4 ms
Lead Channel Pacing Threshold Pulse Width: 0.4 ms
Lead Channel Sensing Intrinsic Amplitude: 2.7 mV
Lead Channel Sensing Intrinsic Amplitude: 3 mV
Lead Channel Setting Pacing Amplitude: 1.125
Lead Channel Setting Pacing Amplitude: 2 V
Lead Channel Setting Pacing Pulse Width: 0.4 ms
Lead Channel Setting Sensing Sensitivity: 0.5 mV
Pulse Gen Model: 2272
Pulse Gen Serial Number: 8912213

## 2021-10-19 NOTE — Progress Notes (Signed)
Monitored patient during scan, reprogrammed and sent transmission post scan.  Prior to scan the rep changed patient settings per order, "Change device settings for MRI to  DOO at 85 bpm"

## 2021-10-21 NOTE — Progress Notes (Signed)
Remote pacemaker transmission.   

## 2021-10-27 LAB — COLOGUARD: COLOGUARD: NEGATIVE

## 2021-10-28 ENCOUNTER — Other Ambulatory Visit: Payer: Self-pay

## 2021-10-28 ENCOUNTER — Encounter: Payer: Self-pay | Admitting: Cardiology

## 2021-10-28 ENCOUNTER — Ambulatory Visit: Payer: Medicare PPO | Admitting: Cardiology

## 2021-10-28 VITALS — BP 164/108 | HR 66 | Ht 75.0 in | Wt 204.8 lb

## 2021-10-28 DIAGNOSIS — E782 Mixed hyperlipidemia: Secondary | ICD-10-CM

## 2021-10-28 DIAGNOSIS — I77810 Thoracic aortic ectasia: Secondary | ICD-10-CM

## 2021-10-28 DIAGNOSIS — E1121 Type 2 diabetes mellitus with diabetic nephropathy: Secondary | ICD-10-CM | POA: Diagnosis not present

## 2021-10-28 DIAGNOSIS — Z794 Long term (current) use of insulin: Secondary | ICD-10-CM | POA: Diagnosis not present

## 2021-10-28 DIAGNOSIS — E291 Testicular hypofunction: Secondary | ICD-10-CM | POA: Diagnosis not present

## 2021-10-28 DIAGNOSIS — Z952 Presence of prosthetic heart valve: Secondary | ICD-10-CM | POA: Diagnosis not present

## 2021-10-28 DIAGNOSIS — R002 Palpitations: Secondary | ICD-10-CM | POA: Diagnosis not present

## 2021-10-28 DIAGNOSIS — E785 Hyperlipidemia, unspecified: Secondary | ICD-10-CM | POA: Diagnosis not present

## 2021-10-28 DIAGNOSIS — I10 Essential (primary) hypertension: Secondary | ICD-10-CM | POA: Diagnosis not present

## 2021-10-28 DIAGNOSIS — N400 Enlarged prostate without lower urinary tract symptoms: Secondary | ICD-10-CM | POA: Diagnosis not present

## 2021-10-28 DIAGNOSIS — N3281 Overactive bladder: Secondary | ICD-10-CM | POA: Diagnosis not present

## 2021-10-28 DIAGNOSIS — M159 Polyosteoarthritis, unspecified: Secondary | ICD-10-CM | POA: Diagnosis not present

## 2021-10-28 DIAGNOSIS — F3341 Major depressive disorder, recurrent, in partial remission: Secondary | ICD-10-CM | POA: Diagnosis not present

## 2021-10-28 MED ORDER — LOSARTAN POTASSIUM 25 MG PO TABS: 25.0000 mg | ORAL_TABLET | Freq: Every day | ORAL | 3 refills | Status: DC

## 2021-10-28 NOTE — Patient Instructions (Signed)
Medication Instructions:   STOP LISINOPRIL  START LOSARTAN 25 MG ONCE DAILY  *If you need a refill on your cardiac medications before your next appointment, please call your pharmacy*   Testing/Procedures:  Your physician has requested that you have an echocardiogram. Echocardiography is a painless test that uses sound waves to create images of your heart. It provides your doctor with information about the size and shape of your heart and how well your hearts chambers and valves are working. This procedure takes approximately one hour. There are no restrictions for this procedure. [place]  CTA OF THE CHEST TO FOLLOW UP ON THE SIZE OF THE AORTA IN THE [facility] DEPARTMENT-SCHEDULE IN APRIL  Follow-Up: At [facility], you and your health needs are our priority.  As part of our continuing mission to provide you with exceptional heart care, we have created designated Provider Care Teams.  These Care Teams include your primary Cardiologist (physician) and Advanced Practice Providers (APPs -  Physician Assistants and Nurse Practitioners) who all work together to provide you with the care you need, when you need it.  We recommend signing up for the patient portal called "MyChart".  Sign up information is provided on this After Visit Summary.  MyChart is used to connect with patients for Virtual Visits (Telemedicine).  Patients are able to view lab/test results, encounter notes, upcoming appointments, etc.  Non-urgent messages can be sent to your provider as well.   To learn more about what you can do with MyChart, go to [email or web address].    Your next appointment:   3 month(s)  The format for your next appointment:   In Person  Provider:   Kirk Ruths, MD

## 2021-11-04 DIAGNOSIS — R972 Elevated prostate specific antigen [PSA]: Secondary | ICD-10-CM

## 2021-11-04 DIAGNOSIS — D509 Iron deficiency anemia, unspecified: Secondary | ICD-10-CM

## 2021-11-04 HISTORY — DX: Elevated prostate specific antigen (PSA): R97.20

## 2021-11-04 HISTORY — DX: Iron deficiency anemia, unspecified: D50.9

## 2021-11-09 ENCOUNTER — Ambulatory Visit: Payer: Medicare PPO | Admitting: Cardiology

## 2021-11-23 ENCOUNTER — Other Ambulatory Visit: Payer: Self-pay

## 2021-11-23 ENCOUNTER — Ambulatory Visit: Payer: Medicare PPO | Admitting: Family Medicine

## 2021-11-23 ENCOUNTER — Encounter: Payer: Self-pay | Admitting: Family Medicine

## 2021-11-23 VITALS — BP 120/83 | HR 65 | Temp 98.6°F | Ht 75.0 in | Wt 204.0 lb

## 2021-11-23 DIAGNOSIS — K909 Intestinal malabsorption, unspecified: Secondary | ICD-10-CM | POA: Diagnosis not present

## 2021-11-23 DIAGNOSIS — I10 Essential (primary) hypertension: Secondary | ICD-10-CM | POA: Diagnosis not present

## 2021-11-23 DIAGNOSIS — Z125 Encounter for screening for malignant neoplasm of prostate: Secondary | ICD-10-CM | POA: Diagnosis not present

## 2021-11-23 DIAGNOSIS — E039 Hypothyroidism, unspecified: Secondary | ICD-10-CM | POA: Diagnosis not present

## 2021-11-23 DIAGNOSIS — Z0000 Encounter for general adult medical examination without abnormal findings: Secondary | ICD-10-CM | POA: Diagnosis not present

## 2021-11-23 DIAGNOSIS — E559 Vitamin D deficiency, unspecified: Secondary | ICD-10-CM

## 2021-11-23 DIAGNOSIS — E7800 Pure hypercholesterolemia, unspecified: Secondary | ICD-10-CM | POA: Diagnosis not present

## 2021-11-23 DIAGNOSIS — E119 Type 2 diabetes mellitus without complications: Secondary | ICD-10-CM

## 2021-11-23 MED ORDER — MIRABEGRON ER 25 MG PO TB24: 25.0000 mg | ORAL_TABLET | Freq: Every day | ORAL | 1 refills | Status: DC

## 2021-11-23 NOTE — Progress Notes (Signed)
Office Note ?11/23/2021 ? ?CC:  ?Chief Complaint  ?Patient presents with  ? New Patient (Initial Visit)  ?  Moved from [facility] care  ? ? ?HPI:  ?Antonio Oliver is a 66 y.o. male who is here accompanied by his wife to establish care and CPE/follow-up chronic medical problems. ?Patient's most recent primary MD: Dr. Catha Brow in [facility] ?Old records were reviewed prior to or during today's visit. ? ?[name] is plagued by constant severe pain in his neck and back. ?Pain meds prescribed by prior PCP but patient recently established with a pain specialist via [facility].  The transition of this prescription process is in the works. ? ?History of well-controlled type 2 diabetes, without complication. ?History of labile hypertension-->orthostatic hypotension at times as well as very high blood pressures other times.  Recently established with Dr. [name] with cardiology and started losartan 25 mg a day and things seem better. ? ?Has known history of ascending aortic aneurysm.  A follow-up CAT scan is already scheduled. ?Also has a history of moderate mitral valve regurgitation, surveillance echocardiogram already ordered by cardiology. ? ?Long history of severe depression, treatment resistant.  Remote history of ECT treatments.  Followed by a psychiatrist for quite a while and has been stable on the current regimen.  Most recently these meds have been refilled through patient's PCP in [place].  Current meds include:  brexpiprazole (rexulti), mirtazapine 30 mg a day, duloxetine 60 mg/day, bupropion SR 200 mg bid, buspirone 20 mg twice daily, clonazepam 0.5 mg twice daily. ? ?Remote history of Roux-en-Y gastric bypass surgery. ?Has not been on B12 or iron supplement at all lately. ?Takes 5000 units vitamin D daily. ? ? ?Past Medical History:  ?Diagnosis Date  ? Anxiety and depression   ? Ascending aorta dilation (HCC) 03/06/2019  ? 4.3 cm 2022  ? Benign prostatic  hyperplasia without lower urinary tract symptoms 11/17/2016  ? Last Assessment & Plan:  Stable PSA today.  Formatting of this note might be different from the original. Last Assessment & Plan:  Stable PSA today. Last Assessment & Plan:  Formatting of this note might be different from the original. Stable PSA today.  ? BPH (benign prostatic hyperplasia)   ? Bradycardia 02/01/2019  ? Cervical myelopathy ([place]) 11/14/2018  ? Charcot's joint of foot 03/05/2019  ? Chronic back pain 04/03/2013  ? Chronic inflammatory arthritis 11/17/2016  ? History of positive rheumatoid factor in the past. Denies placement on immunosuppressive therapy  ? Chronic pain syndrome   ? Failed back surgical syndrome.  Chronic neck pain.  Chronic low back pain with sciatica bilateral  ? Colon cancer screening   ? 2023 cologuard in process via prior PCP  ? Complete heart block ([place]) 05/04/2017  ? DM2 (diabetes mellitus, type 2) ([place]) 04/03/2013  ? Last Assessment & Plan:  Formatting of this note might be different from the original. Diabetes is unchanged.  Continue current treatment regimen. Regular aerobic exercise. Diabetes will be reassessed in 3 months. Will check A1c today. Denies any problems with feet or sensation.  ? Enthesopathy of ankle and tarsus 04/04/2013  ? Last Assessment & Plan:  Continues with swelling after prolonged standing.  Still  Wearing support hose, which helps.  ? Essential hypertension 10/30/2018  ? FHx: migraine headaches 04/04/2013  ? Fracture of capitate bone of wrist 02/26/2019  ? Fracture of tibia 03/05/2019  ? GERD (gastroesophageal reflux disease) 10/30/2018  ? Glaucoma 04/03/2013  ? Hemangioma of skin  and subcutaneous tissue 07/07/2017  ? History of aortic valve replacement 2018  ? bicuspid AV  ? History of kidney stones 11/17/2016  ? History of sleep apnea   ? wt loss->gone  ? Hypothyroidism 10/30/2018  ? Lesion of plantar nerve 04/04/2013  ? Lumbar stenosis with neurogenic claudication 01/08/2021  ? Major  depressive disorder, recurrent ([place])   ? ECT in the remote past.  Patient disabled due to recurrent depression and chronic pain.  ? Migraine syndrome   ? Mixed dyslipidemia 10/30/2018  ? Morbid obesity ([place]) 10/27/2011  ? Nondisplaced fracture of medial cuneiform of left foot, initial encounter for closed fracture 11/16/2018  ? Osteoarthritis 10/30/2018  ? Personal history of tobacco use, presenting hazards to health 04/04/2013  ? Presence of permanent cardiac pacemaker   ? Severe single current episode of major depressive disorder, without psychotic features ([place]) 11/17/2016  ? Status post bariatric surgery 04/05/2011  ? bipass surg --presurg wt 310 lbs  ? Syncope 02/14/2019  ? Vitamin D deficiency 10/30/2018  ? ? ?Past Surgical History:  ?Procedure Laterality Date  ? ANTERIOR CERVICAL DECOMP/DISCECTOMY FUSION N/A 11/17/2018  ? Procedure: CERVICAL THREE-CERVICAL FOUR, CERVICAL FOUR-CERVICAL FIVE, CERVICAL FIVE-CERVICAL SIX ANTERIOR CERVICAL DECOMPRESSION/DISCECTOMY FUSION;  Surgeon: Earnie Larsson, MD;  Location: [place];  Service: Neurosurgery;  Laterality: N/A;  ? AORTIC VALVE REPLACEMENT  03/2017  ? APPLICATION OF WOUND VAC Left 04/05/2019  ? Procedure: Application Of Wound Vac;  Surgeon: Erle Crocker, MD;  Location: [place];  Service: Orthopedics;  Laterality: Left;  ? BACK SURGERY    ? x4  ? CARDIOVASCULAR STRESS TEST  05/05/2021  ? lexiscan neg  ? FASCIOTOMY Left 04/05/2019  ? Left leg 2 compartment fasciotomy  ? FASCIOTOMY Left 04/05/2019  ? Procedure: FASCIOTOMY LEFT LOWER LEG;  Surgeon: Erle Crocker, MD;  Location: [place];  Service: Orthopedics;  Laterality: Left;  ? FOOT NEUROMA SURGERY    ? GASTRIC BYPASS  2012  ? HEMATOMA EVACUATION Left 04/05/2019  ? Procedure: Evacuation Hematoma Left Lower Leg;  Surgeon: Erle Crocker, MD;  Location: [place];  Service: Orthopedics;  Laterality: Left;  ? HEMORRHOID SURGERY    ? over 30 years ago  ? HERNIA REPAIR    ? LIH umb  ? LUMBAR  LAMINECTOMY/DECOMPRESSION MICRODISCECTOMY N/A 01/08/2021  ? Procedure: CENTRAL LUMBAR LAMINECTOMY LUMBAR TWO-THREE, LUMBAR THREE-FOUR;  Surgeon: Jessy Oto, MD;  Location: [place];  Service: Orthopedics;  Laterality: N/A;  ? NASAL SINUS SURGERY    ? x2  ? NECK SURGERY    ? TRANSTHORACIC ECHOCARDIOGRAM  12/2020  ? EF normal, mod MR, ascending AA (4.4 cm)  ? ? ?Family History  ?Problem Relation Age of Onset  ? Cancer Mother   ?     breast  ? Rheum arthritis Mother   ? Diabetes Mother   ? Heart disease Father   ? Kidney disease Father   ? Diabetes Father   ? Hypertension Brother   ? Diabetes Brother   ? Osteoarthritis Brother   ? Heart disease Brother   ? Hypertension Brother   ? Diabetes Brother   ? Rheum arthritis Brother   ? Depression Daughter   ? Anxiety disorder Daughter   ? ? ?Social History  ? ?Socioeconomic History  ? Marital status: Married  ?  Spouse name: susan   ? Number of children: 1  ? Years of education: Not on file  ? Highest education level: Bachelor's degree (e.g., BA, AB, BS)  ?Occupational  History  ? Not on file  ?Tobacco Use  ? Smoking status: Never  ? Smokeless tobacco: Never  ?Vaping Use  ? Vaping Use: Never used  ?Substance and Sexual Activity  ? Alcohol use: Yes  ?  Alcohol/week: 1.0 standard drink  ?  Types: 1 Glasses of wine per week  ?  Comment: occ.  ? Drug use: No  ? Sexual activity: Not on file  ?Other Topics Concern  ? Not on file  ?Social History Narrative  ? Patient lives at home with spouse [name]  ? Educ: BS  ? Occup: retired Art therapist.  Disabled due to depression and pain.  ? Tobacco: None  ? Alc: none  ? ?Social Determinants of Health  ? ?Financial Resource Strain: Not on file  ?Food Insecurity: Not on file  ?Transportation Needs: Not on file  ?Physical Activity: Not on file  ?Stress: Not on file  ?Social Connections: Not on file  ?Intimate Partner Violence: Not on file  ? ? ?Outpatient Encounter Medications as of 11/23/2021  ?Medication Sig  ? aspirin EC 81 MG tablet  Take 81 mg by mouth daily.  ? atorvastatin (LIPITOR) 20 MG tablet Take 20 mg by mouth daily at 6 PM.   ? brexpiprazole (REXULTI) 2 MG TABS tablet Take 2 mg by mouth daily.   ? buPROPion (WELLBUTRIN SR) 200 MG 12 hr tablet Take 200 mg b

## 2021-11-24 LAB — LIPID PANEL
Cholesterol: 154 mg/dL (ref 0–200)
HDL: 56.5 mg/dL (ref >39.00)
LDL Cholesterol: 71 mg/dL (ref 0–99)
NonHDL: 97.2
Total CHOL/HDL Ratio: 3
Triglycerides: 129 mg/dL (ref 0.0–149.0)
VLDL: 25.8 mg/dL (ref 0.0–40.0)

## 2021-11-24 LAB — CBC
HCT: 37 % — ABNORMAL LOW (ref 39.0–52.0)
Hemoglobin: 12.2 g/dL — ABNORMAL LOW (ref 13.0–17.0)
MCHC: 33 g/dL (ref 30.0–36.0)
MCV: 82.8 fl (ref 78.0–100.0)
Platelets: 273 10*3/uL (ref 150.0–400.0)
RBC: 4.47 Mil/uL (ref 4.22–5.81)
RDW: 14.2 % (ref 11.5–15.5)
WBC: 7.1 10*3/uL (ref 4.0–10.5)

## 2021-11-24 LAB — COMPREHENSIVE METABOLIC PANEL
ALT: 39 U/L (ref 0–53)
AST: 26 U/L (ref 0–37)
Albumin: 4.5 g/dL (ref 3.5–5.2)
Alkaline Phosphatase: 88 U/L (ref 39–117)
BUN: 15 mg/dL (ref 6–23)
CO2: 26 mEq/L (ref 19–32)
Calcium: 9.6 mg/dL (ref 8.4–10.5)
Chloride: 102 mEq/L (ref 96–112)
Creatinine, Ser: 0.99 mg/dL (ref 0.40–1.50)
GFR: 80.35 mL/min (ref >60.00)
Glucose, Bld: 91 mg/dL (ref 70–99)
Potassium: 4.9 mEq/L (ref 3.5–5.1)
Sodium: 137 mEq/L (ref 135–145)
Total Bilirubin: 0.3 mg/dL (ref 0.2–1.2)
Total Protein: 6.8 g/dL (ref 6.0–8.3)

## 2021-11-24 LAB — VITAMIN D 25 HYDROXY (VIT D DEFICIENCY, FRACTURES): VITD: 53.97 ng/mL (ref 30.00–100.00)

## 2021-11-24 LAB — HEMOGLOBIN A1C: Hgb A1c MFr Bld: 6.3 % (ref 4.6–6.5)

## 2021-11-24 LAB — IRON,TIBC AND FERRITIN PANEL
%SAT: 7 % (calc) — ABNORMAL LOW (ref 20–48)
Ferritin: 16 ng/mL — ABNORMAL LOW (ref 24–380)
Iron: 32 ug/dL — ABNORMAL LOW (ref 50–180)
TIBC: 477 mcg/dL (calc) — ABNORMAL HIGH (ref 250–425)

## 2021-11-24 LAB — VITAMIN B12: Vitamin B-12: 465 pg/mL (ref 211–911)

## 2021-11-24 LAB — PSA: PSA: 4.94 ng/mL — ABNORMAL HIGH (ref 0.10–4.00)

## 2021-11-24 LAB — TSH: TSH: 1.42 u[IU]/mL (ref 0.35–5.50)

## 2021-11-25 ENCOUNTER — Other Ambulatory Visit (HOSPITAL_COMMUNITY): Payer: Medicare PPO

## 2021-11-25 ENCOUNTER — Ambulatory Visit (HOSPITAL_COMMUNITY): Payer: Medicare PPO

## 2021-11-25 ENCOUNTER — Encounter: Payer: Self-pay | Admitting: Family Medicine

## 2021-11-25 DIAGNOSIS — I10 Essential (primary) hypertension: Secondary | ICD-10-CM | POA: Diagnosis not present

## 2021-11-25 DIAGNOSIS — Z794 Long term (current) use of insulin: Secondary | ICD-10-CM | POA: Diagnosis not present

## 2021-11-25 DIAGNOSIS — F3341 Major depressive disorder, recurrent, in partial remission: Secondary | ICD-10-CM | POA: Diagnosis not present

## 2021-11-25 DIAGNOSIS — E785 Hyperlipidemia, unspecified: Secondary | ICD-10-CM | POA: Diagnosis not present

## 2021-11-25 DIAGNOSIS — E291 Testicular hypofunction: Secondary | ICD-10-CM | POA: Diagnosis not present

## 2021-11-25 DIAGNOSIS — N3281 Overactive bladder: Secondary | ICD-10-CM | POA: Diagnosis not present

## 2021-11-25 DIAGNOSIS — M159 Polyosteoarthritis, unspecified: Secondary | ICD-10-CM | POA: Diagnosis not present

## 2021-11-25 DIAGNOSIS — E1121 Type 2 diabetes mellitus with diabetic nephropathy: Secondary | ICD-10-CM | POA: Diagnosis not present

## 2021-11-25 DIAGNOSIS — N400 Enlarged prostate without lower urinary tract symptoms: Secondary | ICD-10-CM | POA: Diagnosis not present

## 2021-11-26 DIAGNOSIS — M5442 Lumbago with sciatica, left side: Secondary | ICD-10-CM | POA: Diagnosis not present

## 2021-11-26 DIAGNOSIS — M4802 Spinal stenosis, cervical region: Secondary | ICD-10-CM | POA: Diagnosis not present

## 2021-11-26 DIAGNOSIS — M5416 Radiculopathy, lumbar region: Secondary | ICD-10-CM | POA: Diagnosis not present

## 2021-11-26 DIAGNOSIS — M5412 Radiculopathy, cervical region: Secondary | ICD-10-CM | POA: Diagnosis not present

## 2021-11-26 DIAGNOSIS — Z79891 Long term (current) use of opiate analgesic: Secondary | ICD-10-CM | POA: Diagnosis not present

## 2021-11-26 DIAGNOSIS — G8929 Other chronic pain: Secondary | ICD-10-CM | POA: Diagnosis not present

## 2021-11-26 DIAGNOSIS — M4803 Spinal stenosis, cervicothoracic region: Secondary | ICD-10-CM | POA: Diagnosis not present

## 2021-11-26 DIAGNOSIS — M961 Postlaminectomy syndrome, not elsewhere classified: Secondary | ICD-10-CM | POA: Diagnosis not present

## 2021-11-26 DIAGNOSIS — M5441 Lumbago with sciatica, right side: Secondary | ICD-10-CM | POA: Diagnosis not present

## 2021-12-07 ENCOUNTER — Ambulatory Visit (INDEPENDENT_AMBULATORY_CARE_PROVIDER_SITE_OTHER): Payer: Medicare PPO

## 2021-12-07 DIAGNOSIS — R918 Other nonspecific abnormal finding of lung field: Secondary | ICD-10-CM | POA: Diagnosis not present

## 2021-12-07 DIAGNOSIS — I77810 Thoracic aortic ectasia: Secondary | ICD-10-CM | POA: Diagnosis not present

## 2021-12-07 MED ORDER — IOHEXOL 350 MG/ML SOLN
100.0000 mL | Freq: Once | INTRAVENOUS | Status: AC | PRN
  Administered 2021-12-07: 100 mL via INTRAVENOUS

## 2021-12-08 ENCOUNTER — Ambulatory Visit (HOSPITAL_COMMUNITY): Payer: Medicare PPO | Attending: Cardiology

## 2021-12-08 DIAGNOSIS — Z952 Presence of prosthetic heart valve: Secondary | ICD-10-CM

## 2021-12-08 LAB — ECHOCARDIOGRAM COMPLETE
AR max vel: 1.9 cm2
AV Area VTI: 1.99 cm2
AV Area mean vel: 1.99 cm2
AV Mean grad: 11 mmHg
AV Peak grad: 21.5 mmHg
Ao pk vel: 2.32 m/s
Area-P 1/2: 2 cm2
S' Lateral: 2.5 cm

## 2021-12-11 DIAGNOSIS — E1151 Type 2 diabetes mellitus with diabetic peripheral angiopathy without gangrene: Secondary | ICD-10-CM | POA: Diagnosis not present

## 2021-12-11 DIAGNOSIS — G43909 Migraine, unspecified, not intractable, without status migrainosus: Secondary | ICD-10-CM | POA: Diagnosis not present

## 2021-12-11 DIAGNOSIS — Z833 Family history of diabetes mellitus: Secondary | ICD-10-CM | POA: Diagnosis not present

## 2021-12-11 DIAGNOSIS — F32A Depression, unspecified: Secondary | ICD-10-CM | POA: Diagnosis not present

## 2021-12-11 DIAGNOSIS — E039 Hypothyroidism, unspecified: Secondary | ICD-10-CM | POA: Diagnosis not present

## 2021-12-11 DIAGNOSIS — Z803 Family history of malignant neoplasm of breast: Secondary | ICD-10-CM | POA: Diagnosis not present

## 2021-12-11 DIAGNOSIS — E663 Overweight: Secondary | ICD-10-CM | POA: Diagnosis not present

## 2021-12-11 DIAGNOSIS — Z823 Family history of stroke: Secondary | ICD-10-CM | POA: Diagnosis not present

## 2021-12-11 DIAGNOSIS — Z7985 Long-term (current) use of injectable non-insulin antidiabetic drugs: Secondary | ICD-10-CM | POA: Diagnosis not present

## 2021-12-11 DIAGNOSIS — E1142 Type 2 diabetes mellitus with diabetic polyneuropathy: Secondary | ICD-10-CM | POA: Diagnosis not present

## 2021-12-11 DIAGNOSIS — Z825 Family history of asthma and other chronic lower respiratory diseases: Secondary | ICD-10-CM | POA: Diagnosis not present

## 2021-12-11 DIAGNOSIS — Z818 Family history of other mental and behavioral disorders: Secondary | ICD-10-CM | POA: Diagnosis not present

## 2021-12-11 DIAGNOSIS — Z8249 Family history of ischemic heart disease and other diseases of the circulatory system: Secondary | ICD-10-CM | POA: Diagnosis not present

## 2021-12-11 DIAGNOSIS — E785 Hyperlipidemia, unspecified: Secondary | ICD-10-CM | POA: Diagnosis not present

## 2021-12-11 DIAGNOSIS — I10 Essential (primary) hypertension: Secondary | ICD-10-CM | POA: Diagnosis not present

## 2021-12-11 DIAGNOSIS — Z85828 Personal history of other malignant neoplasm of skin: Secondary | ICD-10-CM | POA: Diagnosis not present

## 2021-12-11 DIAGNOSIS — F419 Anxiety disorder, unspecified: Secondary | ICD-10-CM | POA: Diagnosis not present

## 2021-12-11 DIAGNOSIS — Z79891 Long term (current) use of opiate analgesic: Secondary | ICD-10-CM | POA: Diagnosis not present

## 2021-12-15 ENCOUNTER — Telehealth: Payer: Self-pay | Admitting: Neurology

## 2021-12-15 ENCOUNTER — Other Ambulatory Visit: Payer: Self-pay

## 2021-12-15 DIAGNOSIS — G43009 Migraine without aura, not intractable, without status migrainosus: Secondary | ICD-10-CM

## 2021-12-15 MED ORDER — EMGALITY 120 MG/ML ~~LOC~~ SOAJ: 120.0000 mg | SUBCUTANEOUS | 4 refills | Status: DC

## 2021-12-15 MED ORDER — EMGALITY 120 MG/ML ~~LOC~~ SOAJ: 120.0000 mg | SUBCUTANEOUS | 5 refills | Status: DC

## 2021-12-15 NOTE — Telephone Encounter (Signed)
Pt wife said the pharmacy told her they have reached out to [facility] a few times regarding a refill on his Emgality. He has not had it and needs it.  ? ?[facility] pharmacy [facility] ?

## 2021-12-15 NOTE — Telephone Encounter (Signed)
Called wife and let her know we have not received any notification of needing a refill. Medication Emagality has been sent in to pharmacy  ?

## 2021-12-21 DIAGNOSIS — M5412 Radiculopathy, cervical region: Secondary | ICD-10-CM | POA: Diagnosis not present

## 2021-12-21 DIAGNOSIS — G629 Polyneuropathy, unspecified: Secondary | ICD-10-CM | POA: Diagnosis not present

## 2021-12-23 DIAGNOSIS — N3281 Overactive bladder: Secondary | ICD-10-CM | POA: Diagnosis not present

## 2021-12-23 DIAGNOSIS — I10 Essential (primary) hypertension: Secondary | ICD-10-CM | POA: Diagnosis not present

## 2021-12-23 DIAGNOSIS — Z794 Long term (current) use of insulin: Secondary | ICD-10-CM | POA: Diagnosis not present

## 2021-12-23 DIAGNOSIS — E1121 Type 2 diabetes mellitus with diabetic nephropathy: Secondary | ICD-10-CM | POA: Diagnosis not present

## 2021-12-23 DIAGNOSIS — M159 Polyosteoarthritis, unspecified: Secondary | ICD-10-CM | POA: Diagnosis not present

## 2021-12-23 DIAGNOSIS — E291 Testicular hypofunction: Secondary | ICD-10-CM | POA: Diagnosis not present

## 2021-12-23 DIAGNOSIS — F3341 Major depressive disorder, recurrent, in partial remission: Secondary | ICD-10-CM | POA: Diagnosis not present

## 2021-12-23 DIAGNOSIS — E785 Hyperlipidemia, unspecified: Secondary | ICD-10-CM | POA: Diagnosis not present

## 2021-12-23 DIAGNOSIS — N400 Enlarged prostate without lower urinary tract symptoms: Secondary | ICD-10-CM | POA: Diagnosis not present

## 2021-12-28 ENCOUNTER — Ambulatory Visit: Payer: Medicare PPO | Admitting: Family Medicine

## 2021-12-28 DIAGNOSIS — M14672 Charcot's joint, left ankle and foot: Secondary | ICD-10-CM | POA: Diagnosis not present

## 2021-12-28 DIAGNOSIS — M19071 Primary osteoarthritis, right ankle and foot: Secondary | ICD-10-CM | POA: Diagnosis not present

## 2021-12-28 DIAGNOSIS — I739 Peripheral vascular disease, unspecified: Secondary | ICD-10-CM | POA: Diagnosis not present

## 2021-12-28 DIAGNOSIS — M19072 Primary osteoarthritis, left ankle and foot: Secondary | ICD-10-CM | POA: Diagnosis not present

## 2021-12-28 DIAGNOSIS — E1142 Type 2 diabetes mellitus with diabetic polyneuropathy: Secondary | ICD-10-CM | POA: Diagnosis not present

## 2022-01-07 NOTE — Progress Notes (Signed)
? ? ? ? ?NWG:[id number] history of aortic valve replacement and thoracic aortic aneurysm.  Previously followed by Dr. Geraldo Pitter.  Patient has a history of bicuspid aortic valve and underwent aortic valve replacement March 10, 2017.  Procedure was complicated by complete heart block requiring pacemaker placement.  Note preoperative cardiac catheterization revealed no coronary disease. Nuclear study August 2022 showed no ischemia.  Echocardiogram repeated April 2023 and showed normal LV function, moderate left ventricular hypertrophy, moderate posteriorly directed mitral regurgitation, previous aortic valve replacement with mean gradient 11 mmHg and trace aortic insufficiency, mild dilatation of the ascending aorta at 43 mm.  CTA April 2023 showed 4.3 cm ascending aorta.  Since last seen he has occasional dyspnea not related to activities.  He denies significant dyspnea on exertion, orthopnea, PND, exertional chest pain or syncope.  Occasional minimal pedal edema. ? ?Current Outpatient Medications  ?Medication Sig Dispense Refill  ? aspirin EC 81 MG tablet Take 81 mg by mouth daily.    ? atorvastatin (LIPITOR) 20 MG tablet Take 20 mg by mouth daily at 6 PM.     ? brexpiprazole (REXULTI) 2 MG TABS tablet Take 2 mg by mouth daily.     ? buPROPion (WELLBUTRIN SR) 200 MG 12 hr tablet Take 200 mg by mouth 2 (two) times daily.    ? busPIRone (BUSPAR) 10 MG tablet Take 20 mg by mouth 2 (two) times daily.     ? calcium citrate (CALCITRATE - DOSED IN MG ELEMENTAL CALCIUM) 950 MG tablet Take 200 mg of elemental calcium by mouth 2 (two) times daily.    ? Cholecalciferol 125 MCG (5000 UT) TABS Take 125 mcg by mouth daily.    ? clonazePAM (KLONOPIN) 0.5 MG tablet Take 0.5 mg by mouth 2 (two) times daily as needed for anxiety.    ? diclofenac Sodium (VOLTAREN) 1 % GEL Apply 1 application topically 4 (four) times daily.    ? docusate sodium (COLACE) 100 MG capsule Take 1 capsule (100 mg total) by mouth 2 (two) times daily. 10 capsule 0   ? DULoxetine (CYMBALTA) 60 MG capsule Take 60 mg by mouth 2 (two) times daily.     ? fexofenadine (ALLEGRA) 180 MG tablet Take 180 mg by mouth daily as needed for allergies.    ? finasteride (PROSCAR) 5 MG tablet Take 5 mg by mouth daily.    ? fluticasone (FLONASE) 50 MCG/ACT nasal spray Place 2 sprays into the nose daily as needed for allergies or rhinitis.    ? folic acid (FOLVITE) 1 MG tablet Take 1 mg by mouth daily.    ? furosemide (LASIX) 20 MG tablet Take 1 tablet (20 mg total) by mouth as needed (for leg swelling). 30 tablet 4  ? gabapentin (NEURONTIN) 600 MG tablet Take 600-1,200 mg by mouth 3 (three) times daily.    ? Galcanezumab-gnlm (EMGALITY) 120 MG/ML SOAJ Inject 120 mg into the skin every 28 (twenty-eight) days. 1 mL 4  ? levothyroxine (SYNTHROID) 125 MCG tablet Take 125 mcg by mouth daily.     ? losartan (COZAAR) 25 MG tablet Take 1 tablet (25 mg total) by mouth daily. 90 tablet 3  ? Magnesium 500 MG CAPS Take 500 mg by mouth 2 (two) times a day.     ? metFORMIN (GLUCOPHAGE) 1000 MG tablet Take 1,000 mg by mouth 2 (two) times daily with a meal.    ? metoprolol succinate (TOPROL-XL) 50 MG 24 hr tablet Take 25 mg by mouth 2 (two) times daily.    ?  mirtazapine (REMERON) 30 MG tablet Take 30 mg by mouth at bedtime.     ? Multiple Vitamins-Minerals (MULTIVITAMIN WITH MINERALS) tablet Take 1 tablet by mouth daily.    ? mupirocin ointment (BACTROBAN) 2 % Apply 1 application topically 3 (three) times daily as needed (infection).    ? omeprazole (PRILOSEC) 20 MG capsule Take 20 mg by mouth 2 (two) times daily before a meal.     ? oxyCODONE-acetaminophen (PERCOCET) 10-325 MG tablet Take 1 tablet by mouth 3 (three) times daily.    ? polyethylene glycol (MIRALAX / GLYCOLAX) 17 g packet Take 17 g by mouth daily. 14 each 0  ? potassium chloride SA (KLOR-CON) 20 MEQ tablet Take 20 mEq by mouth daily as needed (potassium defficiency). With lasix    ? pregabalin (LYRICA) 75 MG capsule Take 75 mg by mouth 3 (three)  times daily.    ? Semaglutide (OZEMPIC, 0.25 OR 0.5 MG/DOSE, [place]) Inject 0.5 mg into the skin once a week.    ? silodosin (RAPAFLO) 8 MG CAPS capsule Take 8 mg by mouth daily.    ? solifenacin (VESICARE) 10 MG tablet Take 10 mg by mouth daily.    ? tiZANidine (ZANAFLEX) 4 MG capsule Take 4-8 mg by mouth 3 (three) times daily as needed for muscle spasms.    ? traMADol (ULTRAM) 50 MG tablet Take 50 mg by mouth 3 (three) times daily.    ? Ubrogepant (UBRELVY) 100 MG TABS Take 1 tablet by mouth as needed (May repeat in 2 hours.  Maximum 2 tablets in 24 hours.). 16 tablet 5  ? naloxone (NARCAN) nasal spray 4 mg/0.1 mL Instill or spray into the nose if you are experiencing shortness of breath or signs of opioid overdose. 1 each 0  ? ?No current facility-administered medications for this visit.  ? ? ? ?Past Medical History:  ?Diagnosis Date  ? Anxiety and depression   ? Ascending aorta dilation (HCC) 03/06/2019  ? 4.3 cm 2022  ? Benign prostatic hyperplasia without lower urinary tract symptoms 11/17/2016  ? Last Assessment & Plan:  Stable PSA today.  Formatting of this note might be different from the original. Last Assessment & Plan:  Stable PSA today. Last Assessment & Plan:  Formatting of this note might be different from the original. Stable PSA today.  ? BPH (benign prostatic hyperplasia)   ? Bradycardia 02/01/2019  ? Cervical myelopathy ([place]) 11/14/2018  ? Charcot's joint of foot 03/05/2019  ? Chronic back pain 04/03/2013  ? Chronic inflammatory arthritis 11/17/2016  ? History of positive rheumatoid factor in the past. Denies placement on immunosuppressive therapy  ? Chronic pain syndrome   ? Failed back surgical syndrome.  Chronic neck pain.  Chronic low back pain with sciatica bilateral  ? Colon cancer screening   ? 2023 cologuard neg  ? Complete heart block ([place]) 05/04/2017  ? DM2 (diabetes mellitus, type 2) ([place]) 04/03/2013  ? Last Assessment & Plan:  Formatting of this note might be different from the original.  Diabetes is unchanged.  Continue current treatment regimen. Regular aerobic exercise. Diabetes will be reassessed in 3 months. Will check A1c today. Denies any problems with feet or sensation.  ? Elevated PSA 11/2021  ? Enthesopathy of ankle and tarsus 04/04/2013  ? Last Assessment & Plan:  Continues with swelling after prolonged standing.  Still  Wearing support hose, which helps.  ? Essential hypertension 10/30/2018  ? FHx: migraine headaches 04/04/2013  ? Fracture of capitate bone of wrist  02/26/2019  ? Fracture of tibia 03/05/2019  ? GERD (gastroesophageal reflux disease) 10/30/2018  ? Glaucoma 04/03/2013  ? Hemangioma of skin and subcutaneous tissue 07/07/2017  ? History of aortic valve replacement 2018  ? bicuspid AV  ? History of kidney stones 11/17/2016  ? History of sleep apnea   ? wt loss->gone  ? Hypothyroidism 10/30/2018  ? IDA (iron deficiency anemia) 11/2021  ? malabs d/t gastric bipass  ? Lesion of plantar nerve 04/04/2013  ? Lumbar stenosis with neurogenic claudication 01/08/2021  ? Major depressive disorder, recurrent ([place])   ? ECT in the remote past.  Patient disabled due to recurrent depression and chronic pain.  ? Migraine syndrome   ? Mixed dyslipidemia 10/30/2018  ? Morbid obesity ([place]) 10/27/2011  ? Nondisplaced fracture of medial cuneiform of left foot, initial encounter for closed fracture 11/16/2018  ? Osteoarthritis 10/30/2018  ? Personal history of tobacco use, presenting hazards to health 04/04/2013  ? Presence of permanent cardiac pacemaker   ? Severe single current episode of major depressive disorder, without psychotic features ([place]) 11/17/2016  ? Status post bariatric surgery 04/05/2011  ? bipass surg --presurg wt 310 lbs  ? Syncope 02/14/2019  ? Vitamin D deficiency 10/30/2018  ? ? ?Past Surgical History:  ?Procedure Laterality Date  ? ANTERIOR CERVICAL DECOMP/DISCECTOMY FUSION N/A 11/17/2018  ? Procedure: CERVICAL THREE-CERVICAL FOUR, CERVICAL FOUR-CERVICAL FIVE, CERVICAL  FIVE-CERVICAL SIX ANTERIOR CERVICAL DECOMPRESSION/DISCECTOMY FUSION;  Surgeon: Earnie Larsson, MD;  Location: [place];  Service: Neurosurgery;  Laterality: N/A;  ? AORTIC VALVE REPLACEMENT  03/2017  ? APPLICATION O

## 2022-01-18 ENCOUNTER — Ambulatory Visit (INDEPENDENT_AMBULATORY_CARE_PROVIDER_SITE_OTHER): Payer: Medicare PPO

## 2022-01-18 DIAGNOSIS — I442 Atrioventricular block, complete: Secondary | ICD-10-CM | POA: Diagnosis not present

## 2022-01-19 ENCOUNTER — Ambulatory Visit: Payer: Medicare PPO | Admitting: Family Medicine

## 2022-01-19 LAB — CUP PACEART REMOTE DEVICE CHECK
Battery Remaining Longevity: 80 mo
Battery Remaining Percentage: 61 %
Battery Voltage: 3.02 V
Brady Statistic AP VP Percent: 1 %
Brady Statistic AP VS Percent: 5.4 %
Brady Statistic AS VP Percent: 1 %
Brady Statistic AS VS Percent: 94 %
Brady Statistic RA Percent Paced: 5.3 %
Brady Statistic RV Percent Paced: 1 %
Date Time Interrogation Session: 20230515020013
Implantable Lead Implant Date: 20180710
Implantable Lead Implant Date: 20180710
Implantable Lead Location: 753859
Implantable Lead Location: 753860
Implantable Pulse Generator Implant Date: 20180710
Lead Channel Impedance Value: 460 Ohm
Lead Channel Impedance Value: 480 Ohm
Lead Channel Pacing Threshold Amplitude: 0.5 V
Lead Channel Pacing Threshold Amplitude: 0.875 V
Lead Channel Pacing Threshold Pulse Width: 0.4 ms
Lead Channel Pacing Threshold Pulse Width: 0.4 ms
Lead Channel Sensing Intrinsic Amplitude: 0.9 mV
Lead Channel Sensing Intrinsic Amplitude: 2.4 mV
Lead Channel Setting Pacing Amplitude: 1.125
Lead Channel Setting Pacing Amplitude: 2 V
Lead Channel Setting Pacing Pulse Width: 0.4 ms
Lead Channel Setting Sensing Sensitivity: 0.5 mV
Pulse Gen Model: 2272
Pulse Gen Serial Number: 8912213

## 2022-01-20 ENCOUNTER — Ambulatory Visit: Payer: Medicare PPO | Admitting: Cardiology

## 2022-01-20 ENCOUNTER — Encounter: Payer: Self-pay | Admitting: Cardiology

## 2022-01-20 VITALS — BP 146/100 | HR 60 | Ht 75.0 in | Wt 204.0 lb

## 2022-01-20 DIAGNOSIS — M659 Synovitis and tenosynovitis, unspecified: Secondary | ICD-10-CM | POA: Diagnosis not present

## 2022-01-20 DIAGNOSIS — N3281 Overactive bladder: Secondary | ICD-10-CM | POA: Diagnosis not present

## 2022-01-20 DIAGNOSIS — M159 Polyosteoarthritis, unspecified: Secondary | ICD-10-CM | POA: Diagnosis not present

## 2022-01-20 DIAGNOSIS — E785 Hyperlipidemia, unspecified: Secondary | ICD-10-CM | POA: Diagnosis not present

## 2022-01-20 DIAGNOSIS — I10 Essential (primary) hypertension: Secondary | ICD-10-CM

## 2022-01-20 DIAGNOSIS — N400 Enlarged prostate without lower urinary tract symptoms: Secondary | ICD-10-CM | POA: Diagnosis not present

## 2022-01-20 DIAGNOSIS — Z794 Long term (current) use of insulin: Secondary | ICD-10-CM | POA: Diagnosis not present

## 2022-01-20 DIAGNOSIS — Z952 Presence of prosthetic heart valve: Secondary | ICD-10-CM | POA: Diagnosis not present

## 2022-01-20 DIAGNOSIS — E1121 Type 2 diabetes mellitus with diabetic nephropathy: Secondary | ICD-10-CM | POA: Diagnosis not present

## 2022-01-20 DIAGNOSIS — R002 Palpitations: Secondary | ICD-10-CM

## 2022-01-20 DIAGNOSIS — E782 Mixed hyperlipidemia: Secondary | ICD-10-CM

## 2022-01-20 DIAGNOSIS — E291 Testicular hypofunction: Secondary | ICD-10-CM | POA: Diagnosis not present

## 2022-01-20 NOTE — Patient Instructions (Signed)
?  Testing/Procedures: ? ?Your physician has requested that you have an abdominal aorta duplex. During this test, an ultrasound is used to evaluate the aorta. Allow 30 minutes for this exam. Do not eat after midnight the day before and avoid carbonated beverages [address] ? ? ?Follow-Up: ?At [facility], you and your health needs are our priority.  As part of our continuing mission to provide you with exceptional heart care, we have created designated Provider Care Teams.  These Care Teams include your primary Cardiologist (physician) and Advanced Practice Providers (APPs -  Physician Assistants and Nurse Practitioners) who all work together to provide you with the care you need, when you need it. ? ?We recommend signing up for the patient portal called "MyChart".  Sign up information is provided on this After Visit Summary.  MyChart is used to connect with patients for Virtual Visits (Telemedicine).  Patients are able to view lab/test results, encounter notes, upcoming appointments, etc.  Non-urgent messages can be sent to your provider as well.   ?To learn more about what you can do with MyChart, go to [email or web address].   ? ?Your next appointment:   ?6 month(s) ? ?The format for your next appointment:   ?In Person ? ?Provider:   ?Kirk Ruths, MD  ? ? ? ?Important Information About Sugar ? ? ? ? ?  ?

## 2022-01-26 ENCOUNTER — Telehealth: Payer: Self-pay | Admitting: Family Medicine

## 2022-01-26 NOTE — Telephone Encounter (Signed)
Left message for patient to schedule Annual Wellness Visit.  Please schedule (telephone/video call) with Nurse Health Advisor Tina Betterson, RN at  [facility]. Please call [phone] ask for Kathy 

## 2022-01-26 NOTE — Telephone Encounter (Signed)
Left message for patient to schedule Annual Wellness Visit.  Please schedule (telephone/video call) with Nurse Health Advisor Tina Betterson, RN at [place] [facility]. Please call [phone] ask for Kathy 

## 2022-02-02 DIAGNOSIS — G8929 Other chronic pain: Secondary | ICD-10-CM | POA: Diagnosis not present

## 2022-02-02 DIAGNOSIS — M5412 Radiculopathy, cervical region: Secondary | ICD-10-CM | POA: Diagnosis not present

## 2022-02-02 DIAGNOSIS — M961 Postlaminectomy syndrome, not elsewhere classified: Secondary | ICD-10-CM | POA: Diagnosis not present

## 2022-02-02 DIAGNOSIS — M5416 Radiculopathy, lumbar region: Secondary | ICD-10-CM | POA: Diagnosis not present

## 2022-02-02 DIAGNOSIS — M542 Cervicalgia: Secondary | ICD-10-CM | POA: Diagnosis not present

## 2022-02-02 DIAGNOSIS — M549 Dorsalgia, unspecified: Secondary | ICD-10-CM | POA: Diagnosis not present

## 2022-02-02 DIAGNOSIS — Z79899 Other long term (current) drug therapy: Secondary | ICD-10-CM | POA: Diagnosis not present

## 2022-02-03 ENCOUNTER — Ambulatory Visit (HOSPITAL_COMMUNITY)
Admission: RE | Admit: 2022-02-03 | Discharge: 2022-02-03 | Disposition: A | Discharge status: Home / Self Care | Payer: Medicare PPO | Source: Ambulatory Visit | Attending: Cardiology | Admitting: Cardiology

## 2022-02-03 DIAGNOSIS — I10 Essential (primary) hypertension: Secondary | ICD-10-CM | POA: Insufficient documentation

## 2022-02-04 ENCOUNTER — Encounter: Payer: Self-pay | Admitting: *Deleted

## 2022-02-04 DIAGNOSIS — R000 Tachycardia, unspecified: Secondary | ICD-10-CM | POA: Diagnosis not present

## 2022-02-04 DIAGNOSIS — N400 Enlarged prostate without lower urinary tract symptoms: Secondary | ICD-10-CM | POA: Diagnosis not present

## 2022-02-04 DIAGNOSIS — N3281 Overactive bladder: Secondary | ICD-10-CM | POA: Diagnosis not present

## 2022-02-04 DIAGNOSIS — E1121 Type 2 diabetes mellitus with diabetic nephropathy: Secondary | ICD-10-CM | POA: Diagnosis not present

## 2022-02-04 DIAGNOSIS — F3341 Major depressive disorder, recurrent, in partial remission: Secondary | ICD-10-CM | POA: Diagnosis not present

## 2022-02-04 DIAGNOSIS — E785 Hyperlipidemia, unspecified: Secondary | ICD-10-CM | POA: Diagnosis not present

## 2022-02-04 DIAGNOSIS — I10 Essential (primary) hypertension: Secondary | ICD-10-CM | POA: Diagnosis not present

## 2022-02-04 DIAGNOSIS — Z794 Long term (current) use of insulin: Secondary | ICD-10-CM | POA: Diagnosis not present

## 2022-02-04 DIAGNOSIS — M659 Synovitis and tenosynovitis, unspecified: Secondary | ICD-10-CM | POA: Diagnosis not present

## 2022-02-04 NOTE — Progress Notes (Signed)
Remote pacemaker transmission.   

## 2022-02-18 DIAGNOSIS — R7309 Other abnormal glucose: Secondary | ICD-10-CM | POA: Diagnosis not present

## 2022-02-18 DIAGNOSIS — N3281 Overactive bladder: Secondary | ICD-10-CM | POA: Diagnosis not present

## 2022-02-18 DIAGNOSIS — M159 Polyosteoarthritis, unspecified: Secondary | ICD-10-CM | POA: Diagnosis not present

## 2022-02-18 DIAGNOSIS — M5416 Radiculopathy, lumbar region: Secondary | ICD-10-CM | POA: Diagnosis not present

## 2022-02-18 DIAGNOSIS — Z794 Long term (current) use of insulin: Secondary | ICD-10-CM | POA: Diagnosis not present

## 2022-02-18 DIAGNOSIS — E1121 Type 2 diabetes mellitus with diabetic nephropathy: Secondary | ICD-10-CM | POA: Diagnosis not present

## 2022-02-18 DIAGNOSIS — I10 Essential (primary) hypertension: Secondary | ICD-10-CM | POA: Diagnosis not present

## 2022-02-18 DIAGNOSIS — Z6827 Body mass index (BMI) 27.0-27.9, adult: Secondary | ICD-10-CM | POA: Diagnosis not present

## 2022-02-18 DIAGNOSIS — E291 Testicular hypofunction: Secondary | ICD-10-CM | POA: Diagnosis not present

## 2022-02-18 DIAGNOSIS — E785 Hyperlipidemia, unspecified: Secondary | ICD-10-CM | POA: Diagnosis not present

## 2022-02-18 DIAGNOSIS — N400 Enlarged prostate without lower urinary tract symptoms: Secondary | ICD-10-CM | POA: Diagnosis not present

## 2022-03-19 DIAGNOSIS — E1121 Type 2 diabetes mellitus with diabetic nephropathy: Secondary | ICD-10-CM | POA: Diagnosis not present

## 2022-03-19 DIAGNOSIS — N3281 Overactive bladder: Secondary | ICD-10-CM | POA: Diagnosis not present

## 2022-03-19 DIAGNOSIS — N400 Enlarged prostate without lower urinary tract symptoms: Secondary | ICD-10-CM | POA: Diagnosis not present

## 2022-03-19 DIAGNOSIS — I10 Essential (primary) hypertension: Secondary | ICD-10-CM | POA: Diagnosis not present

## 2022-03-19 DIAGNOSIS — M159 Polyosteoarthritis, unspecified: Secondary | ICD-10-CM | POA: Diagnosis not present

## 2022-03-19 DIAGNOSIS — E291 Testicular hypofunction: Secondary | ICD-10-CM | POA: Diagnosis not present

## 2022-03-19 DIAGNOSIS — E785 Hyperlipidemia, unspecified: Secondary | ICD-10-CM | POA: Diagnosis not present

## 2022-03-19 DIAGNOSIS — Z794 Long term (current) use of insulin: Secondary | ICD-10-CM | POA: Diagnosis not present

## 2022-03-19 DIAGNOSIS — F3341 Major depressive disorder, recurrent, in partial remission: Secondary | ICD-10-CM | POA: Diagnosis not present

## 2022-03-24 ENCOUNTER — Telehealth: Payer: Self-pay | Admitting: Cardiology

## 2022-03-24 NOTE — Telephone Encounter (Signed)
Spoke with pt wife, she has already decreased the losartan to 1/2 tablet daily and is hesitant about removing it all together as his blood pressure spikes but she said she will try that. Follow up scheduled next week with the APP and they will bring his blood pressure log and cuff to that appointment.

## 2022-03-24 NOTE — Telephone Encounter (Signed)
Called pt's wife, when asked can you provide blood pressure readings, she states I can not I am about to get in the shower. She states "it's the same thing we have talked to Dr. [name] about before. One minute we are worried he is going to have a stroke and the next he is about to pass out. I was wondering if he should try Midodrine. I know it is an off label use but it can be used for orthostasis." Will get message to Dr. [name] for review.

## 2022-03-24 NOTE — Telephone Encounter (Signed)
Pt c/o BP issue: STAT if pt c/o blurred vision, one-sided weakness or slurred speech  1. What are your last 5 BP readings?   Highest BP reading was 187/118 and lowest diastolic was 48   2. Are you having any other symptoms (ex. Dizziness, headache, blurred vision, passed out)?   Yes    3. What is your BP issue?   Wife called stating patient is concerned about the fluctuating blood pressure.  Wife stated patient is have dizziness, headache and blurred vision when his blood pressure is very low but has not passed out.

## 2022-03-26 NOTE — Progress Notes (Addendum)
Cardiology Office Note:    Date:  03/31/2022   ID:  QAIS JOWERS, DOB 06-07-1956, MRN [id number]  PCP:  [name], MD  Cardiologist:  Jenean Lindau, MD  Electrophysiologist:  Constance Haw, MD   Referring MD: [name], MD   Chief Complaint: follow-up of hypertension  History of Present Illness:    Antonio Oliver is a 66 y.o. male with a history of normal coronaries on  cardiac catheterization in 01/2017, bicuspid aortic valve with severe aortic stenosis s/p AVR in 03/2017, complete heart block s/p St Jude PPM in 03/2017, hypertension, hyperlipidemia,  type 2 diabetes mellitus, hypothyroidism, GERD, iron deficiency anemia, and migraine who is followed by dr. [name] who is presents today for follow-up of hypertension.  Patient has a history of bicuspid aortic valve with severe aortic stenosis and thoracic aortic aneurysm. He underwent a AVR in 03/2017 at [facility]. Cardiac catheterization prior to this procedure showed normal coronaries. Post-op course was complicated by complete heart block and he required placement of St Jude PPM. Last ischemic evaluation was a Myoview in 04/2021 which was low risk and showed no evidence of ischemia. Last Echo in 12/2021 showed LVEF of 60-65% with moderate LVH of the basal septum but normal wall motion and diastolic function as well functioning bioprosthetic aortic valve with mean gradient of 11 mmHg and moderate posteriorly directed mitral regurgitation. CTA in 12/2021 showed a stable 4.3cm ascending thoracic aorta.  Patient was last seen by Dr. [name] in 01/2022 at which time he reported occasional dyspnea not related to activities and minimal pedal edema but no significant dyspnea on exertion or other CHF symptoms. BP was elevated in the office but has been known to be labile in the past. Therefore, renal dopplers were ordered and showed no significant renal artery stenosis.  Patient presents today for follow-up. Here with wife.   Patient continues to have labile blood pressures and symptoms consistent with orthostasis.  Systolic BP has ranged from 85 to the 160s.  BP currently well controlled in the office at 116/70.  He is tolerating this BP well; however, he starts having symptoms when systolic BP drops in the 50Y.  He reports dizziness and a sensation that "everything is twirling" as well as unsteadiness on his feet.  He does have a history of vertigo and has meclizine which helps some.  This has been an ongoing issue for over a year but wife feels like it is getting worse and more frequent.  He has fallen multiple times and has almost passed out at times.  However, no overt syncope.  He reports occasional fluttering sensation in his chest and a skipped beat but no sustained palpitations.  No chest pain.  He continues to have occasional dyspnea both at rest and with exertion.  No orthopnea or PND.  Has some mild lower extremity edema which is well controlled with compression stockings.   Past Medical History:  Diagnosis Date   Anxiety and depression    Ascending aorta dilation ([place]) 03/06/2019   4.3 cm 2022   Benign prostatic hyperplasia without lower urinary tract symptoms 11/17/2016   Last Assessment & Plan:  Stable PSA today.  Formatting of this note might be different from the original. Last Assessment & Plan:  Stable PSA today. Last Assessment & Plan:  Formatting of this note might be different from the original. Stable PSA today.   BPH (benign prostatic hyperplasia)    Bradycardia 02/01/2019   Cervical myelopathy (  [place]) 11/14/2018   Charcot's joint of foot 03/05/2019   Chronic back pain 04/03/2013   Chronic inflammatory arthritis 11/17/2016   History of positive rheumatoid factor in the past. Denies placement on immunosuppressive therapy   Chronic pain syndrome    Failed back surgical syndrome.  Chronic neck pain.  Chronic low back pain with sciatica bilateral   Colon cancer screening    2023 cologuard neg    Complete heart block (HCC)    Following AVR s/p St Jude PPM in 03/2017   DM2 (diabetes mellitus, type 2) ([place]) 04/03/2013   Last Assessment & Plan:  Formatting of this note might be different from the original. Diabetes is unchanged.  Continue current treatment regimen. Regular aerobic exercise. Diabetes will be reassessed in 3 months. Will check A1c today. Denies any problems with feet or sensation.   Elevated PSA 11/2021   Enthesopathy of ankle and tarsus 04/04/2013   Last Assessment & Plan:  Continues with swelling after prolonged standing.  Still  Wearing support hose, which helps.   Essential hypertension 10/30/2018   FHx: migraine headaches 04/04/2013   Fracture of capitate bone of wrist 02/26/2019   Fracture of tibia 03/05/2019   GERD (gastroesophageal reflux disease) 10/30/2018   Glaucoma 04/03/2013   Hemangioma of skin and subcutaneous tissue 07/07/2017   History of aortic valve replacement 2018   bicuspid AV   History of kidney stones 11/17/2016   History of sleep apnea    wt loss->gone   Hypothyroidism 10/30/2018   IDA (iron deficiency anemia) 11/2021   malabs d/t gastric bipass   Lesion of plantar nerve 04/04/2013   Lumbar stenosis with neurogenic claudication 01/08/2021   Major depressive disorder, recurrent ([place])    ECT in the remote past.  Patient disabled due to recurrent depression and chronic pain.   Migraine syndrome    Mixed dyslipidemia 10/30/2018   Morbid obesity ([place]) 10/27/2011   Nondisplaced fracture of medial cuneiform of left foot, initial encounter for closed fracture 11/16/2018   Osteoarthritis 10/30/2018   Personal history of tobacco use, presenting hazards to health 04/04/2013   Presence of permanent cardiac pacemaker    Severe single current episode of major depressive disorder, without psychotic features ([place]) 11/17/2016   Status post bariatric surgery 04/05/2011   bipass surg --presurg wt 310 lbs   Syncope 02/14/2019   Vitamin D deficiency  10/30/2018    Past Surgical History:  Procedure Laterality Date   ANTERIOR CERVICAL DECOMP/DISCECTOMY FUSION N/A 11/17/2018   Procedure: CERVICAL THREE-CERVICAL FOUR, CERVICAL FOUR-CERVICAL FIVE, CERVICAL FIVE-CERVICAL SIX ANTERIOR CERVICAL DECOMPRESSION/DISCECTOMY FUSION;  Surgeon: Earnie Larsson, MD;  Location: [facility];  Service: Neurosurgery;  Laterality: N/A;   AORTIC VALVE REPLACEMENT  88/2800   APPLICATION OF WOUND VAC Left 04/05/2019   Procedure: Application Of Wound Vac;  Surgeon: Erle Crocker, MD;  Location: [facility];  Service: Orthopedics;  Laterality: Left;   BACK SURGERY     x4   CARDIOVASCULAR STRESS TEST  05/05/2021   lexiscan neg   FASCIOTOMY Left 04/05/2019   Left leg 2 compartment fasciotomy   FASCIOTOMY Left 04/05/2019   Procedure: FASCIOTOMY LEFT LOWER LEG;  Surgeon: Erle Crocker, MD;  Location: [facility];  Service: Orthopedics;  Laterality: Left;   FOOT NEUROMA SURGERY     GASTRIC BYPASS  2012   HEMATOMA EVACUATION Left 04/05/2019   Procedure: Evacuation Hematoma Left Lower Leg;  Surgeon: Erle Crocker, MD;  Location: [facility];  Service: Orthopedics;  Laterality: Left;  HEMORRHOID SURGERY     over 30 years ago   [place] [facility]   LUMBAR LAMINECTOMY/DECOMPRESSION MICRODISCECTOMY N/A 01/08/2021   Procedure: CENTRAL LUMBAR LAMINECTOMY LUMBAR TWO-THREE, LUMBAR THREE-FOUR;  Surgeon: Jessy Oto, MD;  Location: [facility];  Service: Orthopedics;  Laterality: N/A;   NASAL SINUS SURGERY     x2   NECK SURGERY     TRANSTHORACIC ECHOCARDIOGRAM  12/2020   EF normal, mod MR, ascending AA (4.4 cm)    Current Medications: Current Meds  Medication Sig   aspirin EC 81 MG tablet Take 81 mg by mouth daily.   atorvastatin (LIPITOR) 20 MG tablet Take 20 mg by mouth daily at 6 PM.    brexpiprazole (REXULTI) 2 MG TABS tablet Take 2 mg by mouth daily.    buPROPion (WELLBUTRIN SR) 200 MG 12 hr tablet Take 200 mg by mouth 2 (two) times daily.   busPIRone (BUSPAR)  10 MG tablet Take 20 mg by mouth 2 (two) times daily.    calcium citrate (CALCITRATE - DOSED IN MG ELEMENTAL CALCIUM) 950 MG tablet Take 200 mg of elemental calcium by mouth 2 (two) times daily.   Cholecalciferol 125 MCG (5000 UT) TABS Take 125 mcg by mouth daily.   clonazePAM (KLONOPIN) 0.5 MG tablet Take 0.5 mg by mouth 2 (two) times daily as needed for anxiety.   diclofenac Sodium (VOLTAREN) 1 % GEL Apply 1 application topically 4 (four) times daily.   docusate sodium (COLACE) 100 MG capsule Take 1 capsule (100 mg total) by mouth 2 (two) times daily.   DULoxetine (CYMBALTA) 60 MG capsule Take 60 mg by mouth 2 (two) times daily.    fexofenadine (ALLEGRA) 180 MG tablet Take 180 mg by mouth daily as needed for allergies.   finasteride (PROSCAR) 5 MG tablet Take 5 mg by mouth daily.   fluticasone (FLONASE) 50 MCG/ACT nasal spray Place 2 sprays into the nose daily as needed for allergies or rhinitis.   folic acid (FOLVITE) 1 MG tablet Take 1 mg by mouth daily.   furosemide (LASIX) 20 MG tablet Take 1 tablet (20 mg total) by mouth as needed (for leg swelling).   Galcanezumab-gnlm (EMGALITY) 120 MG/ML SOAJ Inject 120 mg into the skin every 28 (twenty-eight) days.   hydrALAZINE (APRESOLINE) 25 MG tablet Take 0.5 tablets (12.5 mg total) by mouth 2 (two) times daily as needed.   levothyroxine (SYNTHROID) 125 MCG tablet Take 125 mcg by mouth daily.    Magnesium 500 MG CAPS Take 500 mg by mouth 2 (two) times a day.    metFORMIN (GLUCOPHAGE) 1000 MG tablet Take 1,000 mg by mouth 2 (two) times daily with a meal.   metoprolol succinate (TOPROL-XL) 50 MG 24 hr tablet Take 25 mg by mouth 2 (two) times daily.   mirtazapine (REMERON) 30 MG tablet Take 30 mg by mouth at bedtime.    Multiple Vitamins-Minerals (MULTIVITAMIN WITH MINERALS) tablet Take 1 tablet by mouth daily.   mupirocin ointment (BACTROBAN) 2 % Apply 1 application topically 3 (three) times daily as needed (infection).   naloxone (NARCAN) nasal  spray 4 mg/0.1 mL Instill or spray into the nose if you are experiencing shortness of breath or signs of opioid overdose.   omeprazole (PRILOSEC) 20 MG capsule Take 20 mg by mouth 2 (two) times daily before a meal.    oxyCODONE-acetaminophen (PERCOCET) 10-325 MG tablet Take 1 tablet by mouth 3 (three) times daily.   polyethylene glycol (MIRALAX / GLYCOLAX)  17 g packet Take 17 g by mouth daily.   potassium chloride SA (KLOR-CON) 20 MEQ tablet Take 20 mEq by mouth daily as needed (potassium defficiency). With lasix   pregabalin (LYRICA) 75 MG capsule Take 100 mg by mouth 3 (three) times daily.   Semaglutide (OZEMPIC, 0.25 OR 0.5 MG/DOSE, [place]) Inject 0.5 mg into the skin once a week.   silodosin (RAPAFLO) 8 MG CAPS capsule Take 8 mg by mouth daily.   solifenacin (VESICARE) 10 MG tablet Take 10 mg by mouth daily.   tiZANidine (ZANAFLEX) 4 MG capsule Take 4-8 mg by mouth 3 (three) times daily as needed for muscle spasms.   traMADol (ULTRAM) 50 MG tablet Take 50 mg by mouth 3 (three) times daily.   Ubrogepant (UBRELVY) 100 MG TABS Take 1 tablet by mouth as needed (May repeat in 2 hours.  Maximum 2 tablets in 24 hours.).     Allergies:   Morphine   Social History   Socioeconomic History   Marital status: Married    Spouse name: susan    Number of children: 1   Years of education: Not on file   Highest education level: Bachelor's degree (e.g., BA, AB, BS)  Occupational History   Not on file  Tobacco Use   Smoking status: Never   Smokeless tobacco: Never  Vaping Use   Vaping Use: Never used  Substance and Sexual Activity   Alcohol use: Yes    Alcohol/week: 1.0 standard drink of alcohol    Types: 1 Glasses of wine per week    Comment: occ.   Drug use: No   Sexual activity: Not on file  Other Topics Concern   Not on file  Social History Narrative   Patient lives at home with spouse [name]   Educ: BS   Occup: retired Art therapist.  Disabled due to depression and pain.   Tobacco:  None   Alc: none   Social Determinants of Radio broadcast assistant Strain: Not on file  Food Insecurity: Not on file  Transportation Needs: Not on file  Physical Activity: Not on file  Stress: Not on file  Social Connections: Not on file     Family History: The patient's family history includes Anxiety disorder in his daughter; Cancer in his mother; Depression in his daughter; Diabetes in his brother, brother, father, and mother; Heart disease in his brother and father; Hypertension in his brother and brother; Kidney disease in his father; Osteoarthritis in his brother; Rheum arthritis in his brother and mother.  ROS:   Please see the history of present illness.     EKGs/Labs/Other Studies Reviewed:    The following studies were reviewed today:  Myoview 05/05/2021:   The study is normal. The study is low risk. There is no evidence of ischemia.   No ST deviation was noted.   There is normal wall motion.   Left ventricular function is normal. _______________  Echocardiogram 12/08/2021: Impressions:  1. Left ventricular ejection fraction, by estimation, is 60 to 65%. The  left ventricle has normal function. The left ventricle has no regional  wall motion abnormalities. There is moderate hypertrophy of the basal  septum. The rest of the LV segments  demonstrate mild left ventricular hypertrophy. Left ventricular diastolic  parameters were normal.   2. Right ventricular systolic function is low normal. The right  ventricular size is normal. There is normal pulmonary artery systolic  pressure. The estimated right ventricular systolic pressure is 63.0 mmHg.  3. The mitral valve is abnormal. There is thickening of the mitral valve  leaflets, most notably of the anterior mitral valve leaflet that has been  noted on prior studies. There is moderate, posteriorly directed mitral  valve regurgitation.   4. The aortic valve has been repaired/replaced. There is a bioprosthetic  valve  present in the aortic position. Aortic valve mean gradient measures  11.0 mmHg. Aortic valve Vmax measures 2.32 m/s. DI 0.5. There is trivial  aortic regurgitation. No evidence   of aortic stenosis.   5. Aortic dilatation noted. There is borderline dilatation of the aortic  root, measuring 37 mm. There is mild dilatation of the ascending aorta,  measuring 43 mm.   6. The inferior vena cava is normal in size with greater than 50%  respiratory variability, suggesting right atrial pressure of 3 mmHg.   Comparison(s): Compared to prior TTE in 12/2020, there continues to be a normal functioning aortic valve bioprosthesis (prior mean gradient  8.77mHg). The ascending aorta is stable in size at 46m(previously 4479m  _______________  Chest CTA 12/07/2021: Impressions: Stable appearance of the ascending aorta, maximal diameter 4.3 cm. Recommend annual imaging followup by CTA or MRA. This recommendation follows 2010 ACCF/AHA/AATS/ACR/ASA/SCA/SCAI/SIR/STS/SVM Guidelines for the Diagnosis and Management of Patients with Thoracic Aortic Disease. Circulation. 2010; 121: E: [id number] aneurysm NOS (ICD10-I71.9)   No change in scattered small subpleural nodules no larger than 3 mm. No follow-up necessary. _______________  Renal Artery Ultrasound 02/03/2022: Summary:  - Largest Aortic Diameter: 2.7 cm  - Renal:  Right: Normal size right kidney. Normal right Resistive Index. Normal cortical thickness of right kidney. 1-59% stenosis of the right renal artery. RRV flow present.  Left:  Normal size of left kidney. Normal left Resistive Index. Normal cortical thickness of the left kidney. No evidence of left renal artery stenosis. LRV flow present. Cyst(s) noted. Avascular cystic lesion in the upper pole of the left kidney, measuring 2.5 cm in length and 1.9 cm in width. Probable left renal artery nephrocalcinosis vs nephrolithiasis with  comet-tail artifact noted.  - Mesenteric:  Normal Celiac artery  and Superior Mesenteric artery findings.     Patent IVC.  EKG:  EKG ordered today. EKG personally reviewed and demonstrates normal sinus rhythm, rate 81 bpm, with 1st degree AV block and new RBBB. Left axis deviation. QTc 478 ms (but 434 ms when you take into account bundle branch block).   Recent Labs: 11/23/2021: ALT 39; BUN 15; Creatinine, Ser 0.99; Hemoglobin 12.2; Platelets 273.0; Potassium 4.9; Sodium 137; TSH 1.42  Recent Lipid Panel    Component Value Date/Time   CHOL 154 11/23/2021 1437   CHOL 161 07/12/2019 0910   TRIG 129.0 11/23/2021 1437   HDL 56.50 11/23/2021 1437   HDL 62 07/12/2019 0910   CHOLHDL 3 11/23/2021 1437   VLDL 25.8 11/23/2021 1437   LDLCALC 71 11/23/2021 1437   LDLCALC 81 07/12/2019 0910    Physical Exam:    Vital Signs: BP 116/70   Ht '6\' 3"'$  (1.905 m)   Wt 204 lb 3.2 oz (92.6 kg)   SpO2 96%   BMI 25.52 kg/m     Wt Readings from Last 3 Encounters:  03/31/22 204 lb 3.2 oz (92.6 kg)  01/20/22 204 lb (92.5 kg)  11/23/21 204 lb (92.5 kg)   Orthostatic Vital Signs: Supine: BP 113/84, P 80 Sitting: BP 99/65, P 82 Standing: BP 93/66, P 73 Standing (3 mins): BP 111/72, BP 72  General: [age]. Caucasian  male in no acute distress. HEENT: Normocephalic and atraumatic. Sclera clear.  Neck: Supple. No carotid bruits. No JVD. Heart: RRR. Distinct S1 and S2. No murmurs, gallops, or rubs.  Lungs: No increased work of breathing. Clear to ausculation bilaterally. No wheezes, rhonchi, or rales.  Abdomen: Soft, obese, and non-tender to palpation.  Extremities: No lower extremity edema.    Skin: Warm and dry. Neuro: Alert and oriented x3. No focal deficits. Psych: Normal affect. Responds appropriately.  Assessment:    1. Primary hypertension   2. Orthostatic hypotension   3. Dizziness   4. Bicuspid aortic valve   5. Severe aortic stenosis s/p AVR   6. Complete heart block s/p PPM   7. Aneurysm of ascending aorta without rupture ([place])   8. Mitral valve  insufficiency, unspecified etiology   9. Hyperlipidemia, unspecified hyperlipidemia type   10. Type 2 diabetes mellitus without obesity (HCC)     Plan:    Hypertension Orthostatic Hypotension Dizziness Patient continues to have labile BP with systolic BP ranging from 85 to the 160s. He has orthostatic symptoms (dizziness, unsteadiness on feet, etc). when systolic BP is in the 56E. Recent renal artery dopplers showed 1-59% stenosis of the right renal artery and no stenosis of the left. - Orthostatics positive in the office today (see above). - Recommended stopping Losartan 12.'5mg'$  daily. - Continue Toprol-XL '25mg'$  twice daily. - Will provide patient PRN Hydralazine 12.'5mg'$  to take only if systolic BP >332 or diastolic BP >951. He can take this up to twice per day.  - Discussed importance of staying well hydrated and continuing to wear compressions stockings. - Have also asked patient to sent in a remote interrogation of his PPM when he gets home so we can make sure he is not having any significant bradycardia/high grade AV block and malfunction of his PPM.  I also wonder if vertigo may be contributing to dizziness given patient has also has symptoms laying down and describes it has "everything twirling." I also wonder if some of his medicines for chronic pain and anxiety/depression is contributing to his unsteadiness - he is on Klonopin, Cymbalta, Wellbutrin, Buspar, Percocet, Lyrica, and Tramadol. Recommended discussing this with PCP who he is scheduled to see soon.  Shortness of Breath Patient continues to have shortness of breath both at rest and with exertion. He also reports lower extremity edema but this is well controlled with compression stockings. Recent Echo in 12/2021 showed normal LV function and normal diastolic parameters. - He does not appear significant volume overloaded on exam. However, will check BNP and BMET.   New RBBB New RBBB noted on Echo today. Recent Echo in 12/2021  showed LVEF of 60-65% with moderate hypertrophy of basal septum but normal diastolic parameters as well as normal RV. - I don't think this is contributing to his symptoms above. No additional cardiac work-up necessary at this time. I will also discuss this with Dr. [name] to make sure he does not have any other recommendations.   Bicuspid Aortic Stenosis with Severe Aortic Stenosis s/p AVR S/p AVR in 03/2017 at [facility]. Last Echo in 12/2021 showed stable bioprosthetic aortic valve with only trivial AI and no evidence of AS. Mean gradient 11.0 mmHg. - Continue SBE prophylaxis prior to dental procedures.  Complete Heart Block s/p St Jude PPM Occurred as a complication after AVR requiring placement of PPM in 03/2017. - Followed by Dr. Curt Bears.  Thoracic Aortic Aneurysm Chest CTA in 12/2021 showed stable ascending thoracic aorta measuring  4.3 cm. - Plan is for repeat CTA in 12/2022.  Moderate Mitral Regurgitation Noted on Echo in 12/2021. - Plan is for repeat Echo in 12/2022.  Hyperlipidemia Lipid panel in 01/2022 Total Cholesterol 150, Triglycerides 143, HDL 49, LDL 76. - Continue Lipitor '20mg'$  daily.  Type 2 Diabetes Mellitus Hemoglobin A1c 6.1 in 01/2022. - Management per PCP.  Disposition: Follow up in 2-3 months.  ADDENDUM 04/09/2022: Reviewed with Dr. [name] who agreed no additional evaluation is needed for his new RBBB at this time.    Medication Adjustments/Labs and Tests Ordered: Current medicines are reviewed at length with the patient today.  Concerns regarding medicines are outlined above.  Orders Placed This Encounter  Procedures   Brain natriuretic peptide   Basic metabolic panel   EKG [id number]   Meds ordered this encounter  Medications   hydrALAZINE (APRESOLINE) 25 MG tablet    Sig: Take 0.5 tablets (12.5 mg total) by mouth 2 (two) times daily as needed.    Dispense:  90 tablet    Refill:  3    Patient Instructions  Medication Instructions:  STOP  Losartan Start Hydralazine 12.5 mg twice daily as needed only when systolic blood pressure (top number) is greater than 786 or diastolic blood pressure(bottom number) is greater than 100.     *If you need a refill on your cardiac medications before your next appointment, please call your pharmacy*   Lab Work: Your physician recommends that you complete labs today BMET  BNP  If you have labs (blood work) drawn today and your tests are completely normal, you will receive your results only by: MyChart Message (if you have MyChart) OR A paper copy in the mail If you have any lab test that is abnormal or we need to change your treatment, we will call you to review the results.   Testing/Procedures: NONE ordered at this time of appointment     Follow-Up: At [facility], you and your health needs are our priority.  As part of our continuing mission to provide you with exceptional heart care, we have created designated Provider Care Teams.  These Care Teams include your primary Cardiologist (physician) and Advanced Practice Providers (APPs -  Physician Assistants and Nurse Practitioners) who all work together to provide you with the care you need, when you need it.  We recommend signing up for the patient portal called "MyChart".  Sign up information is provided on this After Visit Summary.  MyChart is used to connect with patients for Virtual Visits (Telemedicine).  Patients are able to view lab/test results, encounter notes, upcoming appointments, etc.  Non-urgent messages can be sent to your provider as well.   To learn more about what you can do with MyChart, go to [email or web address].    Your next appointment:   2-3 month(s)  The format for your next appointment:   In Person  Provider:    Sande Rives, PA-C        Other Instructions   Important Information About Sugar         Signed, Darreld Mclean, PA-C  03/31/2022 6:15 PM    [place]

## 2022-03-30 ENCOUNTER — Encounter: Payer: Self-pay | Admitting: Student

## 2022-03-30 ENCOUNTER — Telehealth: Payer: Self-pay

## 2022-03-30 NOTE — Telephone Encounter (Signed)
Called pt to schedule AWV. Please schedule with health coach. 

## 2022-03-31 ENCOUNTER — Ambulatory Visit: Payer: Medicare PPO | Admitting: Student

## 2022-03-31 ENCOUNTER — Encounter: Payer: Self-pay | Admitting: Student

## 2022-03-31 VITALS — BP 116/70 | Ht 75.0 in | Wt 204.2 lb

## 2022-03-31 DIAGNOSIS — I10 Essential (primary) hypertension: Secondary | ICD-10-CM | POA: Diagnosis not present

## 2022-03-31 DIAGNOSIS — I340 Nonrheumatic mitral (valve) insufficiency: Secondary | ICD-10-CM | POA: Diagnosis not present

## 2022-03-31 DIAGNOSIS — E119 Type 2 diabetes mellitus without complications: Secondary | ICD-10-CM

## 2022-03-31 DIAGNOSIS — I7121 Aneurysm of the ascending aorta, without rupture: Secondary | ICD-10-CM

## 2022-03-31 DIAGNOSIS — I951 Orthostatic hypotension: Secondary | ICD-10-CM

## 2022-03-31 DIAGNOSIS — R42 Dizziness and giddiness: Secondary | ICD-10-CM

## 2022-03-31 DIAGNOSIS — I442 Atrioventricular block, complete: Secondary | ICD-10-CM

## 2022-03-31 DIAGNOSIS — I350 Nonrheumatic aortic (valve) stenosis: Secondary | ICD-10-CM | POA: Diagnosis not present

## 2022-03-31 DIAGNOSIS — Q231 Congenital insufficiency of aortic valve: Secondary | ICD-10-CM

## 2022-03-31 DIAGNOSIS — R0600 Dyspnea, unspecified: Secondary | ICD-10-CM | POA: Diagnosis not present

## 2022-03-31 DIAGNOSIS — E785 Hyperlipidemia, unspecified: Secondary | ICD-10-CM

## 2022-03-31 MED ORDER — HYDRALAZINE HCL 25 MG PO TABS: 12.5000 mg | ORAL_TABLET | Freq: Two times a day (BID) | ORAL | 3 refills | Status: DC | PRN

## 2022-03-31 NOTE — Patient Instructions (Addendum)
Medication Instructions:  STOP Losartan Start Hydralazine 12.5 mg twice daily as needed only when systolic blood pressure (top number) is greater than 675 or diastolic blood pressure(bottom number) is greater than 100.     *If you need a refill on your cardiac medications before your next appointment, please call your pharmacy*   Lab Work: Your physician recommends that you complete labs today BMET  BNP  If you have labs (blood work) drawn today and your tests are completely normal, you will receive your results only by: MyChart Message (if you have MyChart) OR A paper copy in the mail If you have any lab test that is abnormal or we need to change your treatment, we will call you to review the results.   Testing/Procedures: NONE ordered at this time of appointment     Follow-Up: At [facility], you and your health needs are our priority.  As part of our continuing mission to provide you with exceptional heart care, we have created designated Provider Care Teams.  These Care Teams include your primary Cardiologist (physician) and Advanced Practice Providers (APPs -  Physician Assistants and Nurse Practitioners) who all work together to provide you with the care you need, when you need it.  We recommend signing up for the patient portal called "MyChart".  Sign up information is provided on this After Visit Summary.  MyChart is used to connect with patients for Virtual Visits (Telemedicine).  Patients are able to view lab/test results, encounter notes, upcoming appointments, etc.  Non-urgent messages can be sent to your provider as well.   To learn more about what you can do with MyChart, go to [email or web address].    Your next appointment:   2-3 month(s)  The format for your next appointment:   In Person  Provider:    Sande Rives, PA-C        Other Instructions   Important Information About Sugar

## 2022-04-01 LAB — BASIC METABOLIC PANEL
BUN/Creatinine Ratio: 16 (ref 10–24)
BUN: 17 mg/dL (ref 8–27)
CO2: 23 mmol/L (ref 20–29)
Calcium: 9.5 mg/dL (ref 8.6–10.2)
Chloride: 104 mmol/L (ref 96–106)
Creatinine, Ser: 1.08 mg/dL (ref 0.76–1.27)
Glucose: 95 mg/dL (ref 70–99)
Potassium: 5.3 mmol/L — ABNORMAL HIGH (ref 3.5–5.2)
Sodium: 141 mmol/L (ref 134–144)
eGFR: 76 mL/min/{1.73_m2} (ref >59)

## 2022-04-01 LAB — BRAIN NATRIURETIC PEPTIDE: BNP: 78.4 pg/mL (ref 0.0–100.0)

## 2022-04-02 ENCOUNTER — Other Ambulatory Visit: Payer: Self-pay

## 2022-04-02 DIAGNOSIS — Z794 Long term (current) use of insulin: Secondary | ICD-10-CM | POA: Diagnosis not present

## 2022-04-02 DIAGNOSIS — E291 Testicular hypofunction: Secondary | ICD-10-CM | POA: Diagnosis not present

## 2022-04-02 DIAGNOSIS — I10 Essential (primary) hypertension: Secondary | ICD-10-CM | POA: Diagnosis not present

## 2022-04-02 DIAGNOSIS — R42 Dizziness and giddiness: Secondary | ICD-10-CM

## 2022-04-02 DIAGNOSIS — E1121 Type 2 diabetes mellitus with diabetic nephropathy: Secondary | ICD-10-CM | POA: Diagnosis not present

## 2022-04-02 DIAGNOSIS — E039 Hypothyroidism, unspecified: Secondary | ICD-10-CM | POA: Diagnosis not present

## 2022-04-02 DIAGNOSIS — N3281 Overactive bladder: Secondary | ICD-10-CM | POA: Diagnosis not present

## 2022-04-02 DIAGNOSIS — M159 Polyosteoarthritis, unspecified: Secondary | ICD-10-CM | POA: Diagnosis not present

## 2022-04-02 DIAGNOSIS — E538 Deficiency of other specified B group vitamins: Secondary | ICD-10-CM | POA: Diagnosis not present

## 2022-04-02 DIAGNOSIS — N400 Enlarged prostate without lower urinary tract symptoms: Secondary | ICD-10-CM | POA: Diagnosis not present

## 2022-04-02 DIAGNOSIS — E785 Hyperlipidemia, unspecified: Secondary | ICD-10-CM | POA: Diagnosis not present

## 2022-04-02 DIAGNOSIS — I951 Orthostatic hypotension: Secondary | ICD-10-CM

## 2022-04-10 ENCOUNTER — Telehealth: Payer: Self-pay

## 2022-04-10 NOTE — Telephone Encounter (Signed)
Contacted patient to schedule Medicare Wellness.  Not able to leave message. In review, we have tried to reach out to patient prior via phone and mychart.

## 2022-04-12 ENCOUNTER — Emergency Department (HOSPITAL_BASED_OUTPATIENT_CLINIC_OR_DEPARTMENT_OTHER): Payer: Medicare PPO

## 2022-04-12 ENCOUNTER — Other Ambulatory Visit: Payer: Self-pay

## 2022-04-12 ENCOUNTER — Encounter (HOSPITAL_BASED_OUTPATIENT_CLINIC_OR_DEPARTMENT_OTHER): Payer: Self-pay

## 2022-04-12 ENCOUNTER — Encounter (HOSPITAL_COMMUNITY): Payer: Self-pay

## 2022-04-12 ENCOUNTER — Emergency Department (HOSPITAL_BASED_OUTPATIENT_CLINIC_OR_DEPARTMENT_OTHER): Payer: Medicare PPO | Admitting: Radiology

## 2022-04-12 ENCOUNTER — Inpatient Hospital Stay (HOSPITAL_BASED_OUTPATIENT_CLINIC_OR_DEPARTMENT_OTHER)
Admission: EM | Admit: 2022-04-12 | Discharge: 2022-04-16 | DRG: 501 | Disposition: A | Discharge status: Home / Self Care | Payer: Medicare PPO | Attending: Family Medicine | Admitting: Family Medicine

## 2022-04-12 DIAGNOSIS — M25522 Pain in left elbow: Secondary | ICD-10-CM | POA: Diagnosis not present

## 2022-04-12 DIAGNOSIS — M71129 Other infective bursitis, unspecified elbow: Secondary | ICD-10-CM | POA: Diagnosis present

## 2022-04-12 DIAGNOSIS — Z043 Encounter for examination and observation following other accident: Secondary | ICD-10-CM | POA: Diagnosis not present

## 2022-04-12 DIAGNOSIS — E785 Hyperlipidemia, unspecified: Secondary | ICD-10-CM | POA: Diagnosis not present

## 2022-04-12 DIAGNOSIS — L03113 Cellulitis of right upper limb: Secondary | ICD-10-CM | POA: Diagnosis present

## 2022-04-12 DIAGNOSIS — E1159 Type 2 diabetes mellitus with other circulatory complications: Secondary | ICD-10-CM | POA: Diagnosis present

## 2022-04-12 DIAGNOSIS — K219 Gastro-esophageal reflux disease without esophagitis: Secondary | ICD-10-CM | POA: Diagnosis present

## 2022-04-12 DIAGNOSIS — F419 Anxiety disorder, unspecified: Secondary | ICD-10-CM | POA: Diagnosis present

## 2022-04-12 DIAGNOSIS — L02413 Cutaneous abscess of right upper limb: Secondary | ICD-10-CM | POA: Diagnosis present

## 2022-04-12 DIAGNOSIS — I350 Nonrheumatic aortic (valve) stenosis: Secondary | ICD-10-CM | POA: Diagnosis present

## 2022-04-12 DIAGNOSIS — I152 Hypertension secondary to endocrine disorders: Secondary | ICD-10-CM | POA: Diagnosis present

## 2022-04-12 DIAGNOSIS — M7022 Olecranon bursitis, left elbow: Secondary | ICD-10-CM | POA: Diagnosis not present

## 2022-04-12 DIAGNOSIS — N400 Enlarged prostate without lower urinary tract symptoms: Secondary | ICD-10-CM | POA: Diagnosis present

## 2022-04-12 DIAGNOSIS — M71122 Other infective bursitis, left elbow: Principal | ICD-10-CM | POA: Diagnosis present

## 2022-04-12 DIAGNOSIS — B9562 Methicillin resistant Staphylococcus aureus infection as the cause of diseases classified elsewhere: Secondary | ICD-10-CM | POA: Diagnosis present

## 2022-04-12 DIAGNOSIS — Z66 Do not resuscitate: Secondary | ICD-10-CM | POA: Diagnosis present

## 2022-04-12 DIAGNOSIS — Z833 Family history of diabetes mellitus: Secondary | ICD-10-CM

## 2022-04-12 DIAGNOSIS — M48062 Spinal stenosis, lumbar region with neurogenic claudication: Secondary | ICD-10-CM | POA: Diagnosis present

## 2022-04-12 DIAGNOSIS — M7110 Other infective bursitis, unspecified site: Secondary | ICD-10-CM | POA: Diagnosis present

## 2022-04-12 DIAGNOSIS — Z818 Family history of other mental and behavioral disorders: Secondary | ICD-10-CM

## 2022-04-12 DIAGNOSIS — I4510 Unspecified right bundle-branch block: Secondary | ICD-10-CM | POA: Insufficient documentation

## 2022-04-12 DIAGNOSIS — F418 Other specified anxiety disorders: Secondary | ICD-10-CM | POA: Diagnosis not present

## 2022-04-12 DIAGNOSIS — Z953 Presence of xenogenic heart valve: Secondary | ICD-10-CM

## 2022-04-12 DIAGNOSIS — Z803 Family history of malignant neoplasm of breast: Secondary | ICD-10-CM

## 2022-04-12 DIAGNOSIS — Z79899 Other long term (current) drug therapy: Secondary | ICD-10-CM | POA: Diagnosis not present

## 2022-04-12 DIAGNOSIS — I442 Atrioventricular block, complete: Secondary | ICD-10-CM | POA: Diagnosis present

## 2022-04-12 DIAGNOSIS — Z950 Presence of cardiac pacemaker: Secondary | ICD-10-CM | POA: Diagnosis present

## 2022-04-12 DIAGNOSIS — E119 Type 2 diabetes mellitus without complications: Secondary | ICD-10-CM | POA: Diagnosis not present

## 2022-04-12 DIAGNOSIS — E11610 Type 2 diabetes mellitus with diabetic neuropathic arthropathy: Secondary | ICD-10-CM | POA: Diagnosis present

## 2022-04-12 DIAGNOSIS — E782 Mixed hyperlipidemia: Secondary | ICD-10-CM | POA: Diagnosis present

## 2022-04-12 DIAGNOSIS — R000 Tachycardia, unspecified: Secondary | ICD-10-CM

## 2022-04-12 DIAGNOSIS — E559 Vitamin D deficiency, unspecified: Secondary | ICD-10-CM | POA: Diagnosis present

## 2022-04-12 DIAGNOSIS — Z952 Presence of prosthetic heart valve: Secondary | ICD-10-CM

## 2022-04-12 DIAGNOSIS — G8929 Other chronic pain: Secondary | ICD-10-CM | POA: Diagnosis present

## 2022-04-12 DIAGNOSIS — Z8249 Family history of ischemic heart disease and other diseases of the circulatory system: Secondary | ICD-10-CM

## 2022-04-12 DIAGNOSIS — Z87891 Personal history of nicotine dependence: Secondary | ICD-10-CM

## 2022-04-12 DIAGNOSIS — I77810 Thoracic aortic ectasia: Secondary | ICD-10-CM | POA: Diagnosis present

## 2022-04-12 DIAGNOSIS — G894 Chronic pain syndrome: Secondary | ICD-10-CM | POA: Diagnosis not present

## 2022-04-12 DIAGNOSIS — Z79890 Hormone replacement therapy (postmenopausal): Secondary | ICD-10-CM | POA: Diagnosis not present

## 2022-04-12 DIAGNOSIS — Z7982 Long term (current) use of aspirin: Secondary | ICD-10-CM

## 2022-04-12 DIAGNOSIS — E1169 Type 2 diabetes mellitus with other specified complication: Secondary | ICD-10-CM | POA: Diagnosis present

## 2022-04-12 DIAGNOSIS — Z8679 Personal history of other diseases of the circulatory system: Secondary | ICD-10-CM

## 2022-04-12 DIAGNOSIS — I10 Essential (primary) hypertension: Secondary | ICD-10-CM | POA: Diagnosis not present

## 2022-04-12 DIAGNOSIS — W19XXXA Unspecified fall, initial encounter: Secondary | ICD-10-CM

## 2022-04-12 DIAGNOSIS — Z7984 Long term (current) use of oral hypoglycemic drugs: Secondary | ICD-10-CM

## 2022-04-12 DIAGNOSIS — Z9884 Bariatric surgery status: Secondary | ICD-10-CM

## 2022-04-12 DIAGNOSIS — F32A Depression, unspecified: Secondary | ICD-10-CM | POA: Diagnosis present

## 2022-04-12 DIAGNOSIS — E039 Hypothyroidism, unspecified: Secondary | ICD-10-CM | POA: Diagnosis present

## 2022-04-12 DIAGNOSIS — I951 Orthostatic hypotension: Secondary | ICD-10-CM

## 2022-04-12 DIAGNOSIS — M7032 Other bursitis of elbow, left elbow: Secondary | ICD-10-CM | POA: Diagnosis not present

## 2022-04-12 DIAGNOSIS — Z8261 Family history of arthritis: Secondary | ICD-10-CM

## 2022-04-12 DIAGNOSIS — G9389 Other specified disorders of brain: Secondary | ICD-10-CM | POA: Diagnosis not present

## 2022-04-12 LAB — LACTIC ACID, PLASMA: Lactic Acid, Venous: 1.3 mmol/L (ref 0.5–1.9)

## 2022-04-12 LAB — CBC WITH DIFFERENTIAL/PLATELET
Abs Immature Granulocytes: 0.04 10*3/uL (ref 0.00–0.07)
Basophils Absolute: 0.1 10*3/uL (ref 0.0–0.1)
Basophils Relative: 1 %
Eosinophils Absolute: 0.4 10*3/uL (ref 0.0–0.5)
Eosinophils Relative: 4 %
HCT: 36.3 % — ABNORMAL LOW (ref 39.0–52.0)
Hemoglobin: 11.7 g/dL — ABNORMAL LOW (ref 13.0–17.0)
Immature Granulocytes: 0 %
Lymphocytes Relative: 17 %
Lymphs Abs: 1.7 10*3/uL (ref 0.7–4.0)
MCH: 26.7 pg (ref 26.0–34.0)
MCHC: 32.2 g/dL (ref 30.0–36.0)
MCV: 82.9 fL (ref 80.0–100.0)
Monocytes Absolute: 1 10*3/uL (ref 0.1–1.0)
Monocytes Relative: 10 %
Neutro Abs: 6.7 10*3/uL (ref 1.7–7.7)
Neutrophils Relative %: 68 %
Platelets: 200 10*3/uL (ref 150–400)
RBC: 4.38 MIL/uL (ref 4.22–5.81)
RDW: 14.3 % (ref 11.5–15.5)
WBC: 9.9 10*3/uL (ref 4.0–10.5)
nRBC: 0 % (ref 0.0–0.2)

## 2022-04-12 LAB — URINALYSIS, ROUTINE W REFLEX MICROSCOPIC
Bilirubin Urine: NEGATIVE
Glucose, UA: NEGATIVE mg/dL
Hgb urine dipstick: NEGATIVE
Ketones, ur: NEGATIVE mg/dL
Leukocytes,Ua: NEGATIVE
Nitrite: NEGATIVE
Protein, ur: NEGATIVE mg/dL
Specific Gravity, Urine: 1.015 (ref 1.005–1.030)
pH: 5.5 (ref 5.0–8.0)

## 2022-04-12 LAB — BASIC METABOLIC PANEL
Anion gap: 12 (ref 5–15)
BUN: 15 mg/dL (ref 8–23)
CO2: 26 mmol/L (ref 22–32)
Calcium: 9.8 mg/dL (ref 8.9–10.3)
Chloride: 100 mmol/L (ref 98–111)
Creatinine, Ser: 1.11 mg/dL (ref 0.61–1.24)
GFR, Estimated: >60 mL/min (ref >60)
Glucose, Bld: 102 mg/dL — ABNORMAL HIGH (ref 70–99)
Potassium: 4.6 mmol/L (ref 3.5–5.1)
Sodium: 138 mmol/L (ref 135–145)

## 2022-04-12 MED ORDER — LACTATED RINGERS IV BOLUS
1000.0000 mL | Freq: Once | INTRAVENOUS | Status: AC
  Administered 2022-04-12: 1000 mL via INTRAVENOUS

## 2022-04-12 MED ORDER — OXYCODONE-ACETAMINOPHEN 5-325 MG PO TABS
2.0000 | ORAL_TABLET | Freq: Once | ORAL | Status: AC
  Administered 2022-04-12: 2 via ORAL
  Filled 2022-04-12: qty 2

## 2022-04-12 NOTE — ED Notes (Signed)
Patient transported to CT 

## 2022-04-12 NOTE — ED Triage Notes (Signed)
Patient here POV from Home.  Endorses Losing Balance a Few days ago and falling onto [employer] and injuring his Left Elbow.  Since then, the Elbow has been progressively more Painful and Swollen. No LOC. No Other Known Injuries. No Anticoagulants.  NAD Noted during Triage. A&Ox4. GCS 15. BIB Wheelchair; ambulatory with Cane Generally.

## 2022-04-12 NOTE — H&P (Signed)
History and Physical    Antonio Oliver URK:[id number] DOB: 1956-04-18 DOA: 04/12/2022  PCP: [name], MD  Patient coming from: Home  I have personally briefly reviewed patient's old medical records in [place]  Chief Complaint: Left elbow injury  HPI: Antonio Oliver is a 66 y.o. male with medical history significant for CHB s/p PPM, severe AS s/p bioprosthetic AVR 03/2017, ascending aortic aneurysm, RBBB, T2DM, HTN, HLD, BPH, migraines, depression/anxiety, chronic pain who presented to the ED for evaluation of a left elbow injury.  Patient states he was walking with his cane 3 days prior to admission when he lost his balance and fell.  He landed on his left elbow.  He has had progressive swelling and pain since then.  He had small puncture wound at the elbow but has not seen any discharge from the area and has since scabbed over.  He has had decreased range of motion at the elbow and now progressing distally to the hand.  He denies any subjective fevers, chills, diaphoresis, chest pain, dyspnea, abdominal pain, nausea, vomiting, leg swelling.  [place] ED Course  Labs/Imaging on admission: I have personally reviewed following labs and imaging studies.  Initial vitals showed BP 140/94, pulse 81, RR 16, temp 98.1 F, SPO2 99% on room air.  Tmax 100.8 F.  Labs show WBC 9.9, hemoglobin 11.7, platelets 200,000, sodium 138, potassium 4.6, bicarb 26, BUN 15, creatinine 1.11, serum glucose 102, lactic acid 1.3.  Urinalysis negative for UTI.  Single blood culture obtained in process.  Left elbow x-ray showed moderate to high-grade posterior distal upper arm and elbow soft tissue swelling and edema with joint effusion.  No fracture seen.  CT left elbow showed posttraumatic changes involving the soft tissues of the LUE including dorsal aspects of left elbow, left forearm, extending to posterior and medial aspects of the distal left humerus.  Mild to moderate amount of  soft tissue air seen in this region.  Mild to moderate volar soft tissue swelling, edema, fat stranding also seen at the level of the left elbow.  CT head without contrast negative for acute intracranial abnormality.  CT C-spine without contrast shows interval anterior cervical fusion from C3-C6 and arthrodesis of C6-C7.  No acute cervical spine fracture.  Patient was given 1 L LR, Percocet.  EDP discussed with on-call orthopedic surgery, Dr. Alvan Dame, who recommended medical admission and orthopedics will see in consultation.  Review of Systems: All systems reviewed and are negative except as documented in history of present illness above.   Past Medical History:  Diagnosis Date   Anxiety and depression    Ascending aorta dilation ([place]) 03/06/2019   4.3 cm 2022   Benign prostatic hyperplasia without lower urinary tract symptoms 11/17/2016   Last Assessment & Plan:  Stable PSA today.  Formatting of this note might be different from the original. Last Assessment & Plan:  Stable PSA today. Last Assessment & Plan:  Formatting of this note might be different from the original. Stable PSA today.   BPH (benign prostatic hyperplasia)    Bradycardia 02/01/2019   Cervical myelopathy ([place]) 11/14/2018   Charcot's joint of foot 03/05/2019   Chronic back pain 04/03/2013   Chronic inflammatory arthritis 11/17/2016   History of positive rheumatoid factor in the past. Denies placement on immunosuppressive therapy   Chronic pain syndrome    Failed back surgical syndrome.  Chronic neck pain.  Chronic low back pain with sciatica bilateral   Colon cancer  screening    2023 cologuard neg   Complete heart block ([place])    Following AVR s/p [employer] PPM in 03/2017   DM2 (diabetes mellitus, type 2) ([place]) 04/03/2013   Last Assessment & Plan:  Formatting of this note might be different from the original. Diabetes is unchanged.  Continue current treatment regimen. Regular aerobic exercise. Diabetes will be reassessed in  3 months. Will check A1c today. Denies any problems with feet or sensation.   Elevated PSA 11/2021   Enthesopathy of ankle and tarsus 04/04/2013   Last Assessment & Plan:  Continues with swelling after prolonged standing.  Still  Wearing support hose, which helps.   Essential hypertension 10/30/2018   FHx: migraine headaches 04/04/2013   Fracture of capitate bone of wrist 02/26/2019   Fracture of tibia 03/05/2019   GERD (gastroesophageal reflux disease) 10/30/2018   Glaucoma 04/03/2013   Hemangioma of skin and subcutaneous tissue 07/07/2017   History of aortic valve replacement 2018   bicuspid AV   History of kidney stones 11/17/2016   History of sleep apnea    wt loss->gone   Hypothyroidism 10/30/2018   IDA (iron deficiency anemia) 11/2021   malabs d/t gastric bipass   Lesion of plantar nerve 04/04/2013   Lumbar stenosis with neurogenic claudication 01/08/2021   Major depressive disorder, recurrent ([place])    ECT in the remote past.  Patient disabled due to recurrent depression and chronic pain.   Migraine syndrome    Mixed dyslipidemia 10/30/2018   Morbid obesity ([place]) 10/27/2011   Nondisplaced fracture of medial cuneiform of left foot, initial encounter for closed fracture 11/16/2018   Osteoarthritis 10/30/2018   Personal history of tobacco use, presenting hazards to health 04/04/2013   Presence of permanent cardiac pacemaker    Severe single current episode of major depressive disorder, without psychotic features ([place]) 11/17/2016   Status post bariatric surgery 04/05/2011   bipass surg --presurg wt 310 lbs   Syncope 02/14/2019   Vitamin D deficiency 10/30/2018    Past Surgical History:  Procedure Laterality Date   ANTERIOR CERVICAL DECOMP/DISCECTOMY FUSION N/A 11/17/2018   Procedure: CERVICAL THREE-CERVICAL FOUR, CERVICAL FOUR-CERVICAL FIVE, CERVICAL FIVE-CERVICAL SIX ANTERIOR CERVICAL DECOMPRESSION/DISCECTOMY FUSION;  Surgeon: Earnie Larsson, MD;  Location: [facility];  Service:  Neurosurgery;  Laterality: N/A;   AORTIC VALVE REPLACEMENT  [id number]/[id number]   APPLICATION OF WOUND VAC Left 04/05/2019   Procedure: Application Of Wound Vac;  Surgeon: Erle Crocker, MD;  Location: [facility];  Service: Orthopedics;  Laterality: Left;   BACK SURGERY     x4   CARDIOVASCULAR STRESS TEST  05/05/2021   lexiscan neg   FASCIOTOMY Left 04/05/2019   Left leg 2 compartment fasciotomy   FASCIOTOMY Left 04/05/2019   Procedure: FASCIOTOMY LEFT LOWER LEG;  Surgeon: Erle Crocker, MD;  Location: [facility];  Service: Orthopedics;  Laterality: Left;   FOOT NEUROMA SURGERY     GASTRIC BYPASS  2012   HEMATOMA EVACUATION Left 04/05/2019   Procedure: Evacuation Hematoma Left Lower Leg;  Surgeon: Erle Crocker, MD;  Location: [facility];  Service: Orthopedics;  Laterality: Left;   HEMORRHOID SURGERY     over 30 years ago   [place] umb   LUMBAR LAMINECTOMY/DECOMPRESSION MICRODISCECTOMY N/A 01/08/2021   Procedure: CENTRAL LUMBAR LAMINECTOMY LUMBAR TWO-THREE, LUMBAR THREE-FOUR;  Surgeon: Jessy Oto, MD;  Location: [facility];  Service: Orthopedics;  Laterality: N/A;   NASAL SINUS SURGERY     x2  NECK SURGERY     TRANSTHORACIC ECHOCARDIOGRAM  12/2020   EF normal, mod MR, ascending AA (4.4 cm)    Social History:  reports that he has never smoked. He has never used smokeless tobacco. He reports current alcohol use of about 1.0 standard drink of alcohol per week. He reports that he does not use drugs.  Allergies  Allergen Reactions   Morphine Itching    Can take with benadry, facial and nose itching     Family History  Problem Relation Age of Onset   Cancer Mother        breast   Rheum arthritis Mother    Diabetes Mother    Heart disease Father    Kidney disease Father    Diabetes Father    Hypertension Brother    Diabetes Brother    Osteoarthritis Brother    Heart disease Brother    Hypertension Brother    Diabetes Brother    Rheum arthritis Brother     Depression Daughter    Anxiety disorder Daughter      Prior to Admission medications   Medication Sig Start Date End Date Taking? Authorizing Provider  aspirin EC 81 MG tablet Take 81 mg by mouth daily.    [provider]  atorvastatin (LIPITOR) 20 MG tablet Take 20 mg by mouth daily at 6 PM.     [provider]  brexpiprazole (REXULTI) 2 MG TABS tablet Take 2 mg by mouth daily.     [provider]  buPROPion (WELLBUTRIN SR) 200 MG 12 hr tablet Take 200 mg by mouth 2 (two) times daily.    [provider]  busPIRone (BUSPAR) 10 MG tablet Take 20 mg by mouth 2 (two) times daily.     [provider]  calcium citrate (CALCITRATE - DOSED IN MG ELEMENTAL CALCIUM) 950 MG tablet Take 200 mg of elemental calcium by mouth 2 (two) times daily.    [provider]  Cholecalciferol 125 MCG (5000 UT) TABS Take 125 mcg by mouth daily.    [provider]  clonazePAM (KLONOPIN) 0.5 MG tablet Take 0.5 mg by mouth 2 (two) times daily as needed for anxiety.    [provider]  diclofenac Sodium (VOLTAREN) 1 % GEL Apply 1 application topically 4 (four) times daily. 12/25/20   [provider]  docusate sodium (COLACE) 100 MG capsule Take 1 capsule (100 mg total) by mouth 2 (two) times daily. 01/13/21   Ezekiel Slocumb, DO  DULoxetine (CYMBALTA) 60 MG capsule Take 60 mg by mouth 2 (two) times daily.     [provider]  fexofenadine (ALLEGRA) 180 MG tablet Take 180 mg by mouth daily as needed for allergies.    [provider]  finasteride (PROSCAR) 5 MG tablet Take 5 mg by mouth daily.    [provider]  fluticasone (FLONASE) 50 MCG/ACT nasal spray Place 2 sprays into the nose daily as needed for allergies or rhinitis.    [provider]  folic acid (FOLVITE) 1 MG tablet Take 1 mg by mouth daily.    [provider]  furosemide (LASIX) 20 MG tablet Take 1 tablet (20 mg total) by mouth as needed  (for leg swelling). 07/12/19   Revankar, Reita Cliche, MD  Galcanezumab-gnlm (EMGALITY) 120 MG/ML SOAJ Inject 120 mg into the skin every 28 (twenty-eight) days. 12/15/21   Pieter Partridge, DO  hydrALAZINE (APRESOLINE) 25 MG tablet Take 0.5 tablets (12.5 mg total) by mouth 2 (  two) times daily as needed. 03/31/22   Darreld Mclean, PA-C  levothyroxine (SYNTHROID) 125 MCG tablet Take 125 mcg by mouth daily.     [provider]  Magnesium 500 MG CAPS Take 500 mg by mouth 2 (two) times a day.     [provider]  metFORMIN (GLUCOPHAGE) 1000 MG tablet Take 1,000 mg by mouth 2 (two) times daily with a meal.    [provider]  metoprolol succinate (TOPROL-XL) 50 MG 24 hr tablet Take 25 mg by mouth 2 (two) times daily.    [provider]  mirtazapine (REMERON) 30 MG tablet Take 30 mg by mouth at bedtime.     [provider]  Multiple Vitamins-Minerals (MULTIVITAMIN WITH MINERALS) tablet Take 1 tablet by mouth daily.    [provider]  mupirocin ointment (BACTROBAN) 2 % Apply 1 application topically 3 (three) times daily as needed (infection). 11/14/20   [provider]  naloxone [facility]) nasal spray 4 mg/0.1 mL Instill or spray into the nose if you are experiencing shortness of breath or signs of opioid overdose. 01/21/21   Jessy Oto, MD  omeprazole (PRILOSEC) 20 MG capsule Take 20 mg by mouth 2 (two) times daily before a meal.     [provider]  oxyCODONE-acetaminophen (PERCOCET) 10-325 MG tablet Take 1 tablet by mouth 3 (three) times daily.    [provider]  polyethylene glycol (MIRALAX / GLYCOLAX) 17 g packet Take 17 g by mouth daily. 01/14/21   Ezekiel Slocumb, DO  potassium chloride SA (KLOR-CON) 20 MEQ tablet Take 20 mEq by mouth daily as needed (potassium defficiency). With lasix    [provider]  pregabalin (LYRICA) 75 MG capsule Take 100 mg by mouth 3 (three) times daily. 11/26/21   [provider]   Semaglutide (OZEMPIC, 0.25 OR 0.5 MG/DOSE, [place]) Inject 0.5 mg into the skin once a week.    [provider]  silodosin (RAPAFLO) 8 MG CAPS capsule Take 8 mg by mouth daily. 09/22/21   [provider]  solifenacin (VESICARE) 10 MG tablet Take 10 mg by mouth daily.    [provider]  tiZANidine (ZANAFLEX) 4 MG capsule Take 4-8 mg by mouth 3 (three) times daily as needed for muscle spasms.    [provider]  traMADol (ULTRAM) 50 MG tablet Take 50 mg by mouth 3 (three) times daily.    [provider]  Ubrogepant (UBRELVY) 100 MG TABS Take 1 tablet by mouth as needed (May repeat in 2 hours.  Maximum 2 tablets in 24 hours.). 04/22/21   Pieter Partridge, DO    Physical Exam: Vitals:   04/12/22 2145 04/12/22 2230 04/12/22 2245 04/12/22 2347  BP: 133/89 (!) 142/86 137/87 (!) 158/93  Pulse: 81 80 80 79  Resp: '17 16 16 18  '$ Temp:   99.3 F (37.4 C) 98.2 F (36.8 C)  TempSrc:   Oral   SpO2: 92% 94% 94% 98%  Weight:      Height:       Constitutional: Resting in bed, NAD Eyes: EOMI, lids and conjunctivae normal ENMT: Mucous membranes are moist. Posterior pharynx clear of any exudate or lesions.Normal dentition.  Neck: normal, supple, no masses. Respiratory: clear to auscultation bilaterally, no wheezing, no crackles. Normal respiratory effort. No accessory muscle use.  Cardiovascular: Regular rate and rhythm, systolic murmur present.  Swelling to LUE.  No lower extremity edema. 2+ pedal pulses. Abdomen: no tenderness, no masses palpated. Musculoskeletal:  Significant swelling erythema of LUE from the elbow progressing distally.  ROM LUE diminished. Skin: Erythema of LUE with wound at the left elbow as pictured below.  No active discharge. Neurologic: Sensation intact. Strength diminished LUE otherwise 5/5 in all other extremities Psychiatric: Alert and oriented x 3.      EKG: Personally reviewed. Sinus rhythm, first-degree AV block, RBBB.  Similar to  prior.  Assessment/Plan Principal Problem:   Septic bursitis of elbow Active Problems:   DM2 (diabetes mellitus, type 2) ([place])   Hypertension associated with diabetes ([place])   Complete heart block ([place])   Hyperlipidemia associated with type 2 diabetes mellitus ([facility])   Hypothyroidism   Chronic pain   S/P AVR (aortic valve replacement)   Ascending aorta dilation ([facility])   Presence of permanent cardiac pacemaker   Anxiety and depression   Antonio Oliver is a 66 y.o. male with medical history significant for CHB s/p PPM, severe AS s/p bioprosthetic AVR 03/2017, ascending aortic aneurysm, RBBB, T2DM, HTN, HLD, BPH, migraines, depression/anxiety, chronic pain who is admitted with septic bursitis of the left elbow.  Assessment and Plan: * Septic bursitis of elbow Occurring after fall 3 days prior to admit.  CT left elbow shows posttraumatic changes and soft tissue swelling with edema. -Orthopedics to consult -Hold antibiotics for now unless develops signs of sepsis physiology -Follow blood culture -N.p.o.  DM2 (diabetes mellitus, type 2) ([place]) Hold home metformin.  Placed on SSI q4h.  Hypertension associated with diabetes ([place]) Continue Toprol-XL.  Complete heart block ([facility]) S/p PPM RBBB Severe AS s/p bioprosthetic AVR Thoracic ascending aortic aneurysm Follows with cardiology, Dr. [name].  CTA 12/07/2021 showed stable ascending aorta, maximal diameter 4.3 cm.  Anxiety and depression Resume home meds once medication reconciliation complete.  Chronic pain Continue home Percocet.  IV Dilaudid needed for severe pain from left elbow injury.  Hypothyroidism Continue Synthroid.  Hyperlipidemia associated with type 2 diabetes mellitus ([facility]) Continue atorvastatin.  DVT prophylaxis: SCDs Start: 04/13/22 0031 Code Status: DNR, confirmed with patient on admission Family Communication: Discussed with patient, he has discussed with family Disposition Plan: From home and likely  discharge to home pending orthopedic evaluation and intervention Consults called: Orthopedics Severity of Illness: The appropriate patient status for this patient is OBSERVATION. Observation status is judged to be reasonable and necessary in order to provide the required intensity of service to ensure the patient's safety. The patient's presenting symptoms, physical exam findings, and initial radiographic and laboratory data in the context of their medical condition is felt to place them at decreased risk for further clinical deterioration. Furthermore, it is anticipated that the patient will be medically stable for discharge from the hospital within 2 midnights of admission.   Zada Finders MD [facility]  If 7PM-7AM, please contact night-coverage [email or web address]  04/13/2022, 12:59 AM

## 2022-04-12 NOTE — ED Notes (Signed)
[facility] arrived to transport pt. Pt stable at time of departure ?

## 2022-04-12 NOTE — ED Provider Notes (Signed)
[facility] EMERGENCY DEPT Provider Note   CSN: [id number] Arrival date & time: 04/12/22  1511     History  Chief Complaint  Patient presents with   Antonio Oliver is a 66 y.o. male with complicated past medical history to include hypothyroidism, bicuspid aortic valve with severe aortic stenosis status post AVR in July 2018, complete heart block status post [facility] 03/25/2017, hypertension, hyperlipidemia, type 2 diabetes, hypothyroidism, iron deficiency anemia and GERD who presents to the emergency department for evaluation of a left elbow injury that occurred earlier this weekend.  Patient is accompanied by his wife who states that he has had several falls over this weekend.  He reportedly sometimes feels "numb" before his legs just completely give out on him or he becomes dizzy.  The fall that injured his elbow occurred 3 days ago and since then has progressively come more painful, red and swollen.  Denies fever, chills, nausea, vomiting, diarrhea.  He has no new injuries associated with his recent falls.  He does have chronic neck and back pain.   Last echo 12/24/2021 with LVEF of 60 to 65%  Fall Pertinent negatives include no chest pain, no abdominal pain and no shortness of breath.       Home Medications Prior to Admission medications   Medication Sig Start Date End Date Taking? Authorizing Provider  aspirin EC 81 MG tablet Take 81 mg by mouth daily.    [provider]  atorvastatin (LIPITOR) 20 MG tablet Take 20 mg by mouth daily at 6 PM.     [provider]  brexpiprazole (REXULTI) 2 MG TABS tablet Take 2 mg by mouth daily.     [provider]  buPROPion (WELLBUTRIN SR) 200 MG 12 hr tablet Take 200 mg by mouth 2 (two) times daily.    [provider]  busPIRone (BUSPAR) 10 MG tablet Take 20 mg by mouth 2 (two) times daily.     [provider]  calcium citrate (CALCITRATE - DOSED IN MG ELEMENTAL CALCIUM)  950 MG tablet Take 200 mg of elemental calcium by mouth 2 (two) times daily.    [provider]  Cholecalciferol 125 MCG (5000 UT) TABS Take 125 mcg by mouth daily.    [provider]  clonazePAM (KLONOPIN) 0.5 MG tablet Take 0.5 mg by mouth 2 (two) times daily as needed for anxiety.    [provider]  diclofenac Sodium (VOLTAREN) 1 % GEL Apply 1 application topically 4 (four) times daily. 12/25/20   [provider]  docusate sodium (COLACE) 100 MG capsule Take 1 capsule (100 mg total) by mouth 2 (two) times daily. 01/13/21   Ezekiel Slocumb, DO  DULoxetine (CYMBALTA) 60 MG capsule Take 60 mg by mouth 2 (two) times daily.     [provider]  fexofenadine (ALLEGRA) 180 MG tablet Take 180 mg by mouth daily as needed for allergies.    [provider]  finasteride (PROSCAR) 5 MG tablet Take 5 mg by mouth daily.    [provider]  fluticasone (FLONASE) 50 MCG/ACT nasal spray Place 2 sprays into the nose daily as needed for allergies or rhinitis.    [provider]  folic acid (FOLVITE) 1 MG tablet Take 1 mg by mouth daily.    [provider]  furosemide (LASIX) 20 MG tablet Take 1 tablet (20 mg total) by mouth as needed (for leg swelling). 07/12/19   Revankar, Reita Cliche, MD  Galcanezumab-gnlm (EMGALITY) 120 MG/ML SOAJ Inject 120 mg into the skin every 28 (twenty-eight) days. 12/15/21   Pieter Partridge, DO  hydrALAZINE (APRESOLINE) 25 MG tablet Take 0.5 tablets (12.5 mg total) by mouth 2 (two) times daily as needed. 03/31/22   Darreld Mclean, PA-C  levothyroxine (SYNTHROID) 125 MCG tablet Take 125 mcg by mouth daily.     [provider]  Magnesium 500 MG CAPS Take 500 mg by mouth 2 (two) times a day.     [provider]  metFORMIN (GLUCOPHAGE) 1000 MG tablet Take 1,000 mg by mouth 2 (two) times daily with a meal.    [provider]  metoprolol succinate (TOPROL-XL) 50 MG 24 hr tablet Take 25 mg by  mouth 2 (two) times daily.    [provider]  mirtazapine (REMERON) 30 MG tablet Take 30 mg by mouth at bedtime.     [provider]  Multiple Vitamins-Minerals (MULTIVITAMIN WITH MINERALS) tablet Take 1 tablet by mouth daily.    [provider]  mupirocin ointment (BACTROBAN) 2 % Apply 1 application topically 3 (three) times daily as needed (infection). 11/14/20   [provider]  naloxone [facility]) nasal spray 4 mg/0.1 mL Instill or spray into the nose if you are experiencing shortness of breath or signs of opioid overdose. 01/21/21   Jessy Oto, MD  omeprazole (PRILOSEC) 20 MG capsule Take 20 mg by mouth 2 (two) times daily before a meal.     [provider]  oxyCODONE-acetaminophen (PERCOCET) 10-325 MG tablet Take 1 tablet by mouth 3 (three) times daily.    [provider]  polyethylene glycol (MIRALAX / GLYCOLAX) 17 g packet Take 17 g by mouth daily. 01/14/21   Ezekiel Slocumb, DO  potassium chloride SA (KLOR-CON) 20 MEQ tablet Take 20 mEq by mouth daily as needed (potassium defficiency). With lasix    [provider]  pregabalin (LYRICA) 75 MG capsule Take 100 mg by mouth 3 (three) times daily. 11/26/21   [provider]  Semaglutide (OZEMPIC, 0.25 OR 0.5 MG/DOSE, [place]) Inject 0.5 mg into the skin once a week.    [provider]  silodosin (RAPAFLO) 8 MG CAPS capsule Take 8 mg by mouth daily. 09/22/21   [provider]  solifenacin (VESICARE) 10 MG tablet Take 10 mg by mouth daily.    [provider]  tiZANidine (ZANAFLEX) 4 MG capsule Take 4-8 mg by mouth 3 (three) times daily as needed for muscle spasms.    [provider]  traMADol (ULTRAM) 50 MG tablet Take 50 mg by mouth 3 (three) times daily.    [provider]  Ubrogepant (UBRELVY) 100 MG TABS Take 1 tablet by mouth as needed (May repeat in 2 hours.  Maximum 2 tablets in 24 hours.). 04/22/21   Pieter Partridge, DO       Allergies    Morphine    Review of Systems   Review of Systems  Constitutional:  Negative for fever.  Respiratory:  Negative for shortness of breath.   Cardiovascular:  Negative for chest pain.  Gastrointestinal:  Negative for abdominal pain, diarrhea, nausea and vomiting.  Genitourinary:  Negative for dysuria.  Musculoskeletal:  Positive for arthralgias and joint swelling.    Physical Exam Updated Vital Signs BP (!) 149/77   Pulse 85   Temp 98.8 F (37.1 C)   Resp 19   Ht '6\' 3"'$  (1.905 m)   Wt 92.6 kg   SpO2 98%  BMI 25.52 kg/m  Physical Exam Vitals and nursing note reviewed.  Constitutional:      General: He is not in acute distress.    Comments: Chronically ill-appearing  HENT:     Head: Atraumatic.  Eyes:     Conjunctiva/sclera: Conjunctivae normal.  Cardiovascular:     Rate and Rhythm: Normal rate and regular rhythm.     Pulses: Normal pulses.     Heart sounds: No murmur heard. Pulmonary:     Effort: Pulmonary effort is normal. No respiratory distress.     Breath sounds: Normal breath sounds.  Abdominal:     General: Abdomen is flat. There is no distension.     Palpations: Abdomen is soft.     Tenderness: There is no abdominal tenderness.  Musculoskeletal:     Cervical back: Normal range of motion.     Comments: Patient has nearly full active and passive range of motion of the left elbow.  Left elbow is significantly swollen with area of fluctuance over the olecranon.  Surrounding erythema.  Pictures below  Skin:    General: Skin is warm and dry.     Capillary Refill: Capillary refill takes less than 2 seconds.     Comments: Numerous skin tears and various bruises all in various stages of healing.  Patient notes this is normal for him  Neurological:     Mental Status: He is alert.     Comments: Weakness noted on straight leg raise against resistance.  Grip strength intact bilaterally.  Psychiatric:        Mood and Affect: Mood normal.         ED Results / Procedures / Treatments   Labs (all labs ordered are listed, but only abnormal results are displayed) Labs Reviewed  BASIC METABOLIC PANEL - Abnormal; Notable for the following components:      Result Value   Glucose, Bld 102 (*)    All other components within normal limits  CBC WITH DIFFERENTIAL/PLATELET - Abnormal; Notable for the following components:   Hemoglobin 11.7 (*)    HCT 36.3 (*)    All other components within normal limits  CULTURE, BLOOD (ROUTINE X 2)  URINALYSIS, ROUTINE W REFLEX MICROSCOPIC  LACTIC ACID, PLASMA  LACTIC ACID, PLASMA    EKG None  Radiology CT Cervical Spine Wo Contrast  Result Date: 04/12/2022 CLINICAL DATA:  Status post fall. EXAM: CT CERVICAL SPINE WITHOUT CONTRAST TECHNIQUE: Multidetector CT imaging of the cervical spine was performed without intravenous contrast. Multiplanar CT image reconstructions were also generated. RADIATION DOSE REDUCTION: This exam was performed according to the departmental dose-optimization program which includes automated exposure control, adjustment of the mA and/or kV according to patient size and/or use of iterative reconstruction technique. COMPARISON:  November 15, 2018 FINDINGS: Alignment: Normal. Skull base and vertebrae: No acute fracture. A metallic density fusion plate and screws are seen along the anterior aspects of the C3, C4, C5 and C6 vertebral bodies. Soft tissues and spinal canal: No prevertebral fluid or swelling. No visible canal hematoma. Disc levels: Moderate severity endplate sclerosis is seen at the level of C2-C3. There is marked severity narrowing of the anterior atlantoaxial articulation. Anterior surgical fusion is seen from the level of C3 through C6. Arthrodesis of the C6-C7 level is seen. This is noted on the prior study. Bilateral marked severity multilevel facet joint hypertrophy is noted. Upper chest: Negative. Other: None. IMPRESSION: 1. Interval anterior surgical fusion from  the level of C3 through C6 since the  prior study. 2. Arthrodesis of the C6-C7 level. 3. No acute cervical spine fracture. Electronically Signed   By: Virgina Norfolk M.D.   On: 04/12/2022 18:18   CT Elbow Left Wo Contrast  Result Date: 04/12/2022 CLINICAL DATA:  Status post fall. EXAM: CT OF THE UPPER LEFT EXTREMITY WITHOUT CONTRAST TECHNIQUE: Multidetector CT imaging of the upper left extremity was performed according to the standard protocol. RADIATION DOSE REDUCTION: This exam was performed according to the departmental dose-optimization program which includes automated exposure control, adjustment of the mA and/or kV according to patient size and/or use of iterative reconstruction technique. COMPARISON:  None Available. FINDINGS: Bones/Joint/Cartilage There is no evidence of acute fracture, dislocation or focal bone lesion. Ligaments Suboptimally assessed by CT. Muscles and Tendons Limited in evaluation in the absence of intravenous contrast and otherwise unremarkable. Soft tissues Moderate severity soft tissue swelling, with marked severity edema and inflammatory fat stranding, is seen involving the dorsal aspects of the left elbow and visualized portion of the left forearm. This extends to involve the soft tissues along the posterior and medial aspects of the distal left humerus. A mild to moderate amount of soft tissue air is also seen within this region. Mild to moderate severity volar soft tissue swelling, edema and inflammatory fat stranding is seen at the level of the left elbow and visualized portion of the left forearm. IMPRESSION: 1. Post-traumatic changes involving the soft tissues of the left upper extremity, as described above, without an acute osseous abnormality. Electronically Signed   By: Virgina Norfolk M.D.   On: 04/12/2022 18:11   CT Head Wo Contrast  Result Date: 04/12/2022 CLINICAL DATA:  Status post fall. EXAM: CT HEAD WITHOUT CONTRAST TECHNIQUE: Contiguous axial images were  obtained from the base of the skull through the vertex without intravenous contrast. RADIATION DOSE REDUCTION: This exam was performed according to the departmental dose-optimization program which includes automated exposure control, adjustment of the mA and/or kV according to patient size and/or use of iterative reconstruction technique. COMPARISON:  December 25, 2020 FINDINGS: Brain: There is mild cerebral atrophy with widening of the extra-axial spaces and ventricular dilatation. There are areas of decreased attenuation within the white matter tracts of the supratentorial brain, consistent with microvascular disease changes. Vascular: No hyperdense vessel or unexpected calcification. Skull: Normal. Negative for fracture or focal lesion. Sinuses/Orbits: No acute finding. Other: None. IMPRESSION: 1. No acute intracranial abnormality. 2. Generalized cerebral atrophy with chronic white matter small vessel ischemic changes. Electronically Signed   By: Virgina Norfolk M.D.   On: 04/12/2022 17:58   DG Elbow Complete Left  Result Date: 04/12/2022 CLINICAL DATA:  Left elbow pain and swelling from fall. EXAM: LEFT ELBOW - COMPLETE 3+ VIEW COMPARISON:  Lost balance few days ago. Fell indication floor injuring left elbow. FINDINGS: Normal bone mineralization. There is elevation of the distal anterior humeral fat pad an elbow joint effusion. Mild distal coronoid process degenerative spurring. Mild peripheral medial elbow trochlear and coronoid process degenerative osteophytosis. Minimal chronic enthesopathic change at the common extensor tendon origin at the lateral epicondyle. Moderate to high-grade distal posterior upper arm and elbow soft tissue swelling with low-density subcutaneous fat overlying likely edema. No definite acute fracture line is seen.  No dislocation. IMPRESSION: 1. Moderate to high-grade posterior distal upper arm and elbow soft tissue swelling and edema. 2. There is an elbow joint effusion. 3. No  definite acute fracture line is seen. Given the history of recent trauma, it is difficult to entirely exclude  a radiographically occult fracture. Recommend clinical correlation. Electronically Signed   By: Yvonne Kendall M.D.   On: 04/12/2022 15:43    Procedures Procedures    Medications Ordered in ED Medications  lactated ringers bolus 1,000 mL (1,000 mLs Intravenous New Bag/Given 04/12/22 1847)    ED Course/ Medical Decision Making/ A&P                           Medical Decision Making Amount and/or Complexity of Data Reviewed Labs: ordered. Radiology: ordered.   Social determinants of health:  Social History   Socioeconomic History   Marital status: Married    Spouse name: susan    Number of children: 1   Years of education: Not on file   Highest education level: Bachelor's degree (e.g., BA, AB, BS)  Occupational History   Not on file  Tobacco Use   Smoking status: Never   Smokeless tobacco: Never  Vaping Use   Vaping Use: Never used  Substance and Sexual Activity   Alcohol use: Yes    Alcohol/week: 1.0 standard drink of alcohol    Types: 1 Glasses of wine per week    Comment: occ.   Drug use: No   Sexual activity: Not on file  Other Topics Concern   Not on file  Social History Narrative   Patient lives at home with spouse [name]   Educ: BS   Occup: retired Art therapist.  Disabled due to depression and pain.   Tobacco: None   Alc: none   Social Determinants of Radio broadcast assistant Strain: Not on file  Food Insecurity: Not on file  Transportation Needs: Not on file  Physical Activity: Not on file  Stress: Not on file  Social Connections: Not on file  Intimate Partner Violence: Not At Risk (02/15/2019)   Humiliation, Afraid, Rape, and Kick questionnaire    Fear of Current or Ex-Partner: No    Emotionally Abused: No    Physically Abused: No    Sexually Abused: No     Initial impression:  This patient presents to the ED for concern of elbow  pain and swelling, this involves an extensive number of treatment options, and is a complaint that carries with it a high risk of complications and morbidity.   Differentials include septic joint, septic bursitis, cellulitis, abscess.   Comorbidities affecting care:  Numerous, refer to HPI  Additional history obtained: Wife, cardiology records  Lab Tests  I Ordered, reviewed, and interpreted labs and EKG.  The pertinent results include:  CBC without leukocytosis  Imaging Studies ordered:  I ordered imaging studies including  Elbow xray left with large amounts of soft tissue swelling, no evidence of fracture CT head and c-spine without acute findings  CT elbow left with posttraumatic changes involving the soft tissues without osseous abnormality.  Moderate severity soft tissue swelling with edema and inflammatory changes I independently visualized and interpreted imaging and I agree with the radiologist interpretation.   EKG: Possible afib, going in and out per attending Dr Wyvonnia Dusky  Cardiac Monitoring:  The patient was maintained on a cardiac monitor.  I personally viewed and interpreted the cardiac monitored which showed an underlying rhythm of: sinus rhythm  Medicines ordered and prescription drug management:  I ordered medication including: 1L LR bolus   Reevaluation of the patient after these medicines showed that the patient stayed the same I have reviewed the patients home medicines and have made  adjustments as needed   ED Course/Re-evaluation: Patient is chronically ill-appearing and in no acute distress.  Vitals without significant abnormality.  He has numerous skin tears and bruising on his upper and lower extremities consistent with frequent falls.  His left elbow is erythematous, swollen with fluctuance noted over the olecranon.  He does have intact active range of motion on extension and flexion although this increases pain.  Pictures provided above.  His labs were  overall reassuring with normal white blood cell count and no other acute findings. CT findings as described above.  Patient care handed off to [facility] who is pending consult from orthopedics to discuss whether needle aspiration is necessary.  Likely admission for frequent falls, possible new A-fib on EKG.  My attending, Dr. Wyvonnia Dusky is aware of patient and agrees with management.      Final Clinical Impression(s) / ED Diagnoses Final diagnoses:  None    Rx / DC Orders ED Discharge Orders     None         Rodena Piety 04/12/22 1917    Ezequiel Essex, MD 04/13/22 1154

## 2022-04-12 NOTE — Progress Notes (Signed)
Pt has arrived to [facility]. Admission notified of pt arrival. Pt stable, nos signs of acute distress at this time.

## 2022-04-12 NOTE — ED Notes (Signed)
Attempted verbal care handoff with primary nurse. Nurse unable to take verbal report and stated that she will read the chart and let this RN know if she has questions. Primary nurse updated on pt's eta via secured chat.

## 2022-04-12 NOTE — ED Provider Notes (Signed)
Pt's care assumed at 7pm.  I spoke with Dr. Alvan Dame who advised Admit to Medicine.  Pt has septic bursitis with multiple medical problems.   Hospitalist consulted   Antonio Oliver 04/12/22 2047    Antonio Essex, MD 04/13/22 1155

## 2022-04-13 ENCOUNTER — Observation Stay (HOSPITAL_BASED_OUTPATIENT_CLINIC_OR_DEPARTMENT_OTHER): Payer: Medicare PPO | Admitting: Certified Registered"

## 2022-04-13 ENCOUNTER — Other Ambulatory Visit: Payer: Self-pay

## 2022-04-13 ENCOUNTER — Encounter (HOSPITAL_COMMUNITY)
Admission: EM | Disposition: A | Discharge status: Home / Self Care | Payer: Self-pay | Source: Home / Self Care | Attending: Family Medicine

## 2022-04-13 ENCOUNTER — Observation Stay (HOSPITAL_COMMUNITY): Payer: Medicare PPO | Admitting: Certified Registered"

## 2022-04-13 ENCOUNTER — Encounter (HOSPITAL_COMMUNITY): Payer: Self-pay | Admitting: Internal Medicine

## 2022-04-13 DIAGNOSIS — K219 Gastro-esophageal reflux disease without esophagitis: Secondary | ICD-10-CM

## 2022-04-13 DIAGNOSIS — Z8679 Personal history of other diseases of the circulatory system: Secondary | ICD-10-CM

## 2022-04-13 DIAGNOSIS — Z79890 Hormone replacement therapy (postmenopausal): Secondary | ICD-10-CM | POA: Diagnosis not present

## 2022-04-13 DIAGNOSIS — I350 Nonrheumatic aortic (valve) stenosis: Secondary | ICD-10-CM | POA: Diagnosis present

## 2022-04-13 DIAGNOSIS — G894 Chronic pain syndrome: Secondary | ICD-10-CM

## 2022-04-13 DIAGNOSIS — E039 Hypothyroidism, unspecified: Secondary | ICD-10-CM

## 2022-04-13 DIAGNOSIS — E785 Hyperlipidemia, unspecified: Secondary | ICD-10-CM

## 2022-04-13 DIAGNOSIS — I10 Essential (primary) hypertension: Secondary | ICD-10-CM

## 2022-04-13 DIAGNOSIS — M71122 Other infective bursitis, left elbow: Secondary | ICD-10-CM | POA: Diagnosis present

## 2022-04-13 DIAGNOSIS — I152 Hypertension secondary to endocrine disorders: Secondary | ICD-10-CM | POA: Diagnosis present

## 2022-04-13 DIAGNOSIS — I442 Atrioventricular block, complete: Secondary | ICD-10-CM

## 2022-04-13 DIAGNOSIS — F419 Anxiety disorder, unspecified: Secondary | ICD-10-CM | POA: Diagnosis present

## 2022-04-13 DIAGNOSIS — F32A Depression, unspecified: Secondary | ICD-10-CM | POA: Diagnosis present

## 2022-04-13 DIAGNOSIS — E1169 Type 2 diabetes mellitus with other specified complication: Secondary | ICD-10-CM | POA: Diagnosis present

## 2022-04-13 DIAGNOSIS — Z7982 Long term (current) use of aspirin: Secondary | ICD-10-CM | POA: Diagnosis not present

## 2022-04-13 DIAGNOSIS — E1159 Type 2 diabetes mellitus with other circulatory complications: Secondary | ICD-10-CM

## 2022-04-13 DIAGNOSIS — Z818 Family history of other mental and behavioral disorders: Secondary | ICD-10-CM | POA: Diagnosis not present

## 2022-04-13 DIAGNOSIS — L03113 Cellulitis of right upper limb: Secondary | ICD-10-CM | POA: Diagnosis present

## 2022-04-13 DIAGNOSIS — Z66 Do not resuscitate: Secondary | ICD-10-CM | POA: Diagnosis present

## 2022-04-13 DIAGNOSIS — Z7984 Long term (current) use of oral hypoglycemic drugs: Secondary | ICD-10-CM | POA: Diagnosis not present

## 2022-04-13 DIAGNOSIS — Z803 Family history of malignant neoplasm of breast: Secondary | ICD-10-CM | POA: Diagnosis not present

## 2022-04-13 DIAGNOSIS — Z79899 Other long term (current) drug therapy: Secondary | ICD-10-CM | POA: Diagnosis not present

## 2022-04-13 DIAGNOSIS — E782 Mixed hyperlipidemia: Secondary | ICD-10-CM | POA: Diagnosis present

## 2022-04-13 DIAGNOSIS — F418 Other specified anxiety disorders: Secondary | ICD-10-CM

## 2022-04-13 DIAGNOSIS — L02413 Cutaneous abscess of right upper limb: Secondary | ICD-10-CM | POA: Diagnosis present

## 2022-04-13 DIAGNOSIS — N400 Enlarged prostate without lower urinary tract symptoms: Secondary | ICD-10-CM | POA: Diagnosis present

## 2022-04-13 DIAGNOSIS — E11610 Type 2 diabetes mellitus with diabetic neuropathic arthropathy: Secondary | ICD-10-CM | POA: Diagnosis present

## 2022-04-13 DIAGNOSIS — G8929 Other chronic pain: Secondary | ICD-10-CM | POA: Diagnosis present

## 2022-04-13 DIAGNOSIS — B9562 Methicillin resistant Staphylococcus aureus infection as the cause of diseases classified elsewhere: Secondary | ICD-10-CM | POA: Diagnosis present

## 2022-04-13 DIAGNOSIS — I77810 Thoracic aortic ectasia: Secondary | ICD-10-CM | POA: Diagnosis present

## 2022-04-13 DIAGNOSIS — M7022 Olecranon bursitis, left elbow: Secondary | ICD-10-CM | POA: Diagnosis not present

## 2022-04-13 HISTORY — PX: I & D EXTREMITY: SHX5045

## 2022-04-13 LAB — CBC
HCT: 35.5 % — ABNORMAL LOW (ref 39.0–52.0)
Hemoglobin: 11.6 g/dL — ABNORMAL LOW (ref 13.0–17.0)
MCH: 26.7 pg (ref 26.0–34.0)
MCHC: 32.7 g/dL (ref 30.0–36.0)
MCV: 81.8 fL (ref 80.0–100.0)
Platelets: 225 10*3/uL (ref 150–400)
RBC: 4.34 MIL/uL (ref 4.22–5.81)
RDW: 14.1 % (ref 11.5–15.5)
WBC: 9.1 10*3/uL (ref 4.0–10.5)
nRBC: 0 % (ref 0.0–0.2)

## 2022-04-13 LAB — BASIC METABOLIC PANEL
Anion gap: 11 (ref 5–15)
BUN: 12 mg/dL (ref 8–23)
CO2: 23 mmol/L (ref 22–32)
Calcium: 9.1 mg/dL (ref 8.9–10.3)
Chloride: 103 mmol/L (ref 98–111)
Creatinine, Ser: 0.9 mg/dL (ref 0.61–1.24)
GFR, Estimated: >60 mL/min (ref >60)
Glucose, Bld: 135 mg/dL — ABNORMAL HIGH (ref 70–99)
Potassium: 4.4 mmol/L (ref 3.5–5.1)
Sodium: 137 mmol/L (ref 135–145)

## 2022-04-13 LAB — GLUCOSE, CAPILLARY
Glucose-Capillary: 187 mg/dL — ABNORMAL HIGH (ref 70–99)
Glucose-Capillary: 133 mg/dL — ABNORMAL HIGH (ref 70–99)
Glucose-Capillary: 144 mg/dL — ABNORMAL HIGH (ref 70–99)
Glucose-Capillary: 102 mg/dL — ABNORMAL HIGH (ref 70–99)
Glucose-Capillary: 106 mg/dL — ABNORMAL HIGH (ref 70–99)
Glucose-Capillary: 130 mg/dL — ABNORMAL HIGH (ref 70–99)
Glucose-Capillary: 129 mg/dL — ABNORMAL HIGH (ref 70–99)

## 2022-04-13 LAB — HIV ANTIBODY (ROUTINE TESTING W REFLEX): HIV Screen 4th Generation wRfx: NONREACTIVE

## 2022-04-13 LAB — SURGICAL PCR SCREEN
MRSA, PCR: POSITIVE — AB
Staphylococcus aureus: POSITIVE — AB

## 2022-04-13 SURGERY — IRRIGATION AND DEBRIDEMENT EXTREMITY
Anesthesia: General | Site: Elbow | Laterality: Left

## 2022-04-13 MED ORDER — ONDANSETRON HCL 4 MG/2ML IJ SOLN: 4.0000 mg | Freq: Four times a day (QID) | INTRAMUSCULAR | Status: DC | PRN

## 2022-04-13 MED ORDER — 0.9 % SODIUM CHLORIDE (POUR BTL) OPTIME
TOPICAL | Status: DC | PRN
  Administered 2022-04-13: 1000 mL

## 2022-04-13 MED ORDER — ROCURONIUM BROMIDE 10 MG/ML (PF) SYRINGE
PREFILLED_SYRINGE | INTRAVENOUS | Status: AC
  Filled 2022-04-13: qty 10

## 2022-04-13 MED ORDER — SUCCINYLCHOLINE CHLORIDE 200 MG/10ML IV SOSY
PREFILLED_SYRINGE | INTRAVENOUS | Status: AC
  Filled 2022-04-13: qty 10

## 2022-04-13 MED ORDER — HYDROMORPHONE HCL 1 MG/ML IJ SOLN
1.0000 mg | INTRAMUSCULAR | Status: AC | PRN
  Administered 2022-04-13 (×2): 1 mg via INTRAVENOUS
  Filled 2022-04-13 (×2): qty 1

## 2022-04-13 MED ORDER — CEFAZOLIN SODIUM-DEXTROSE 2-4 GM/100ML-% IV SOLN
2.0000 g | INTRAVENOUS | Status: AC
  Administered 2022-04-13: 2 g via INTRAVENOUS
  Filled 2022-04-13: qty 100

## 2022-04-13 MED ORDER — ORAL CARE MOUTH RINSE: 15.0000 mL | OROMUCOSAL | Status: DC | PRN

## 2022-04-13 MED ORDER — BUPIVACAINE HCL (PF) 0.25 % IJ SOLN: INTRAMUSCULAR | Status: DC | PRN

## 2022-04-13 MED ORDER — OXYCODONE HCL 5 MG PO TABS: 5.0000 mg | ORAL_TABLET | Freq: Once | ORAL | Status: DC | PRN

## 2022-04-13 MED ORDER — HYDRALAZINE HCL 25 MG PO TABS
25.0000 mg | ORAL_TABLET | Freq: Four times a day (QID) | ORAL | Status: DC | PRN
  Administered 2022-04-13: 25 mg via ORAL
  Filled 2022-04-13: qty 1

## 2022-04-13 MED ORDER — OXYCODONE-ACETAMINOPHEN 5-325 MG PO TABS
1.0000 | ORAL_TABLET | Freq: Four times a day (QID) | ORAL | Status: DC | PRN
  Administered 2022-04-13 – 2022-04-14 (×4): 2 via ORAL
  Filled 2022-04-13 (×5): qty 2

## 2022-04-13 MED ORDER — LIDOCAINE 2% (20 MG/ML) 5 ML SYRINGE
INTRAMUSCULAR | Status: AC
  Filled 2022-04-13: qty 15

## 2022-04-13 MED ORDER — CLONAZEPAM 0.5 MG PO TABS
0.5000 mg | ORAL_TABLET | Freq: Two times a day (BID) | ORAL | Status: DC | PRN
  Administered 2022-04-13 – 2022-04-15 (×3): 0.5 mg via ORAL
  Filled 2022-04-13 (×3): qty 1

## 2022-04-13 MED ORDER — GLYCOPYRROLATE PF 0.2 MG/ML IJ SOSY
PREFILLED_SYRINGE | INTRAMUSCULAR | Status: AC
  Filled 2022-04-13: qty 2

## 2022-04-13 MED ORDER — MIDAZOLAM HCL 2 MG/2ML IJ SOLN
INTRAMUSCULAR | Status: AC
  Filled 2022-04-13: qty 2

## 2022-04-13 MED ORDER — HYDRALAZINE HCL 25 MG PO TABS: 25.0000 mg | ORAL_TABLET | Freq: Four times a day (QID) | ORAL | Status: DC | PRN

## 2022-04-13 MED ORDER — SENNOSIDES-DOCUSATE SODIUM 8.6-50 MG PO TABS: 1.0000 | ORAL_TABLET | Freq: Every evening | ORAL | Status: DC | PRN

## 2022-04-13 MED ORDER — FENTANYL CITRATE (PF) 100 MCG/2ML IJ SOLN
25.0000 ug | INTRAMUSCULAR | Status: DC | PRN
  Administered 2022-04-13 (×2): 50 ug via INTRAVENOUS

## 2022-04-13 MED ORDER — FENTANYL CITRATE (PF) 250 MCG/5ML IJ SOLN
INTRAMUSCULAR | Status: DC | PRN
  Administered 2022-04-13 (×4): 25 ug via INTRAVENOUS
  Administered 2022-04-13: 50 ug via INTRAVENOUS

## 2022-04-13 MED ORDER — ATORVASTATIN CALCIUM 10 MG PO TABS
20.0000 mg | ORAL_TABLET | Freq: Every day | ORAL | Status: DC
  Administered 2022-04-13 – 2022-04-15 (×3): 20 mg via ORAL
  Filled 2022-04-13 (×3): qty 2

## 2022-04-13 MED ORDER — ONDANSETRON HCL 4 MG PO TABS: 4.0000 mg | ORAL_TABLET | Freq: Four times a day (QID) | ORAL | Status: DC | PRN

## 2022-04-13 MED ORDER — LEVOTHYROXINE SODIUM 25 MCG PO TABS
125.0000 ug | ORAL_TABLET | Freq: Every day | ORAL | Status: DC
  Administered 2022-04-13 – 2022-04-16 (×4): 125 ug via ORAL
  Filled 2022-04-13 (×4): qty 1

## 2022-04-13 MED ORDER — INSULIN ASPART 100 UNIT/ML IJ SOLN
0.0000 [IU] | INTRAMUSCULAR | Status: DC
  Administered 2022-04-14: 1 [IU] via SUBCUTANEOUS
  Administered 2022-04-14: 2 [IU] via SUBCUTANEOUS
  Administered 2022-04-14: 3 [IU] via SUBCUTANEOUS
  Administered 2022-04-15: 1 [IU] via SUBCUTANEOUS
  Administered 2022-04-15: 2 [IU] via SUBCUTANEOUS

## 2022-04-13 MED ORDER — OXYCODONE HCL 5 MG/5ML PO SOLN: 5.0000 mg | Freq: Once | ORAL | Status: DC | PRN

## 2022-04-13 MED ORDER — LACTATED RINGERS IV SOLN: INTRAVENOUS | Status: DC

## 2022-04-13 MED ORDER — PANTOPRAZOLE SODIUM 40 MG PO TBEC
40.0000 mg | DELAYED_RELEASE_TABLET | Freq: Every day | ORAL | Status: DC
  Administered 2022-04-13 – 2022-04-16 (×4): 40 mg via ORAL
  Filled 2022-04-13 (×4): qty 1

## 2022-04-13 MED ORDER — ONDANSETRON HCL 4 MG/2ML IJ SOLN
INTRAMUSCULAR | Status: AC
  Filled 2022-04-13: qty 6

## 2022-04-13 MED ORDER — CHLORHEXIDINE GLUCONATE 4 % EX LIQD: 60.0000 mL | Freq: Once | CUTANEOUS | Status: DC

## 2022-04-13 MED ORDER — ACETAMINOPHEN 650 MG RE SUPP: 650.0000 mg | Freq: Four times a day (QID) | RECTAL | Status: DC | PRN

## 2022-04-13 MED ORDER — SODIUM CHLORIDE 0.9 % IV SOLN
2.0000 g | INTRAVENOUS | Status: DC
  Administered 2022-04-13 – 2022-04-16 (×4): 2 g via INTRAVENOUS
  Filled 2022-04-13 (×4): qty 20

## 2022-04-13 MED ORDER — LIDOCAINE 2% (20 MG/ML) 5 ML SYRINGE
INTRAMUSCULAR | Status: DC | PRN
  Administered 2022-04-13: 50 mg via INTRAVENOUS

## 2022-04-13 MED ORDER — BUPROPION HCL ER (SR) 100 MG PO TB12
200.0000 mg | ORAL_TABLET | Freq: Two times a day (BID) | ORAL | Status: DC
  Administered 2022-04-13 – 2022-04-16 (×8): 200 mg via ORAL
  Filled 2022-04-13 (×10): qty 2

## 2022-04-13 MED ORDER — MUPIROCIN 2 % EX OINT
1.0000 | TOPICAL_OINTMENT | Freq: Two times a day (BID) | CUTANEOUS | Status: DC
  Administered 2022-04-13 – 2022-04-16 (×7): 1 via NASAL
  Filled 2022-04-13 (×3): qty 22

## 2022-04-13 MED ORDER — CHLORHEXIDINE GLUCONATE 0.12 % MT SOLN
15.0000 mL | OROMUCOSAL | Status: AC
  Filled 2022-04-13: qty 15

## 2022-04-13 MED ORDER — FENTANYL CITRATE (PF) 100 MCG/2ML IJ SOLN
INTRAMUSCULAR | Status: AC
  Filled 2022-04-13: qty 2

## 2022-04-13 MED ORDER — DULOXETINE HCL 60 MG PO CPEP
60.0000 mg | ORAL_CAPSULE | Freq: Two times a day (BID) | ORAL | Status: DC
  Administered 2022-04-13 – 2022-04-16 (×7): 60 mg via ORAL
  Filled 2022-04-13 (×7): qty 1

## 2022-04-13 MED ORDER — BUPIVACAINE HCL (PF) 0.25 % IJ SOLN
INTRAMUSCULAR | Status: AC
  Filled 2022-04-13: qty 30

## 2022-04-13 MED ORDER — ONDANSETRON HCL 4 MG/2ML IJ SOLN
INTRAMUSCULAR | Status: DC | PRN
  Administered 2022-04-13: 4 mg via INTRAVENOUS

## 2022-04-13 MED ORDER — OXYCODONE-ACETAMINOPHEN 5-325 MG PO TABS
2.0000 | ORAL_TABLET | Freq: Three times a day (TID) | ORAL | Status: DC | PRN
  Administered 2022-04-13: 2 via ORAL
  Filled 2022-04-13: qty 2

## 2022-04-13 MED ORDER — BUSPIRONE HCL 10 MG PO TABS
20.0000 mg | ORAL_TABLET | Freq: Two times a day (BID) | ORAL | Status: DC
  Administered 2022-04-13 – 2022-04-16 (×7): 20 mg via ORAL
  Filled 2022-04-13 (×7): qty 2

## 2022-04-13 MED ORDER — ACETAMINOPHEN 325 MG PO TABS
650.0000 mg | ORAL_TABLET | Freq: Four times a day (QID) | ORAL | Status: DC | PRN
  Administered 2022-04-13 – 2022-04-14 (×2): 650 mg via ORAL
  Filled 2022-04-13 (×2): qty 2

## 2022-04-13 MED ORDER — MIDAZOLAM HCL 2 MG/2ML IJ SOLN
INTRAMUSCULAR | Status: DC | PRN
  Administered 2022-04-13: 2 mg via INTRAVENOUS

## 2022-04-13 MED ORDER — PREGABALIN 100 MG PO CAPS
100.0000 mg | ORAL_CAPSULE | Freq: Three times a day (TID) | ORAL | Status: DC
  Administered 2022-04-13 – 2022-04-16 (×11): 100 mg via ORAL
  Filled 2022-04-13 (×11): qty 1

## 2022-04-13 MED ORDER — CHLORHEXIDINE GLUCONATE 0.12 % MT SOLN
OROMUCOSAL | Status: AC
  Administered 2022-04-13: 15 mL via OROMUCOSAL
  Filled 2022-04-13: qty 15

## 2022-04-13 MED ORDER — PROPOFOL 10 MG/ML IV BOLUS
INTRAVENOUS | Status: DC | PRN
  Administered 2022-04-13: 50 mg via INTRAVENOUS
  Administered 2022-04-13: 150 mg via INTRAVENOUS

## 2022-04-13 MED ORDER — METOPROLOL SUCCINATE ER 25 MG PO TB24
25.0000 mg | ORAL_TABLET | Freq: Two times a day (BID) | ORAL | Status: DC
  Administered 2022-04-13 – 2022-04-16 (×7): 25 mg via ORAL
  Filled 2022-04-13 (×7): qty 1

## 2022-04-13 MED ORDER — PHENYLEPHRINE 80 MCG/ML (10ML) SYRINGE FOR IV PUSH (FOR BLOOD PRESSURE SUPPORT)
PREFILLED_SYRINGE | INTRAVENOUS | Status: AC
  Filled 2022-04-13: qty 10

## 2022-04-13 MED ORDER — FINASTERIDE 5 MG PO TABS
5.0000 mg | ORAL_TABLET | Freq: Every day | ORAL | Status: DC
  Administered 2022-04-13 – 2022-04-16 (×4): 5 mg via ORAL
  Filled 2022-04-13 (×5): qty 1

## 2022-04-13 MED ORDER — CEFAZOLIN SODIUM 1 G IJ SOLR
INTRAMUSCULAR | Status: AC
  Filled 2022-04-13: qty 20

## 2022-04-13 MED ORDER — FENTANYL CITRATE (PF) 250 MCG/5ML IJ SOLN
INTRAMUSCULAR | Status: AC
  Filled 2022-04-13: qty 5

## 2022-04-13 MED ORDER — SODIUM CHLORIDE 0.9% FLUSH
3.0000 mL | Freq: Two times a day (BID) | INTRAVENOUS | Status: DC
  Administered 2022-04-13 – 2022-04-16 (×8): 3 mL via INTRAVENOUS

## 2022-04-13 MED ORDER — POVIDONE-IODINE 10 % EX SWAB: 2.0000 | Freq: Once | CUTANEOUS | Status: DC

## 2022-04-13 MED ORDER — PROPOFOL 1000 MG/100ML IV EMUL
INTRAVENOUS | Status: AC
  Filled 2022-04-13: qty 100

## 2022-04-13 MED ORDER — DEXAMETHASONE SODIUM PHOSPHATE 10 MG/ML IJ SOLN
INTRAMUSCULAR | Status: AC
  Filled 2022-04-13: qty 2

## 2022-04-13 SURGICAL SUPPLY — 40 items
BAG COUNTER SPONGE SURGICOUNT (BAG) ×2 IMPLANT
BANDAGE ACE 4X5 VEL STRL LF (GAUZE/BANDAGES/DRESSINGS) ×1 IMPLANT
BNDG COHESIVE 1X5 TAN STRL LF (GAUZE/BANDAGES/DRESSINGS) ×1 IMPLANT
BNDG ESMARK 4X9 LF (GAUZE/BANDAGES/DRESSINGS) ×2 IMPLANT
BNDG GAUZE DERMACEA FLUFF (GAUZE/BANDAGES/DRESSINGS) ×4
BNDG GAUZE DERMACEA FLUFF 4 (GAUZE/BANDAGES/DRESSINGS) IMPLANT
BNDG GAUZE ELAST 4 BULKY (GAUZE/BANDAGES/DRESSINGS) ×2 IMPLANT
CAST PADDING STERILE 4X4 (CAST SUPPLIES) ×1 IMPLANT
CORD BIPOLAR FORCEPS 12FT (ELECTRODE) ×2 IMPLANT
COVER SURGICAL LIGHT HANDLE (MISCELLANEOUS) ×2 IMPLANT
CUFF TOURN SGL QUICK 24 (TOURNIQUET CUFF) ×2
CUFF TRNQT CYL 24X4X16.5-23 (TOURNIQUET CUFF) IMPLANT
DRAPE SURG 17X23 STRL (DRAPES) ×2 IMPLANT
ELECT REM PT RETURN 9FT ADLT (ELECTROSURGICAL) ×2
ELECTRODE REM PT RTRN 9FT ADLT (ELECTROSURGICAL) IMPLANT
GAUZE SPONGE 4X4 12PLY STRL (GAUZE/BANDAGES/DRESSINGS) ×2 IMPLANT
GAUZE XEROFORM 5X9 LF (GAUZE/BANDAGES/DRESSINGS) ×1 IMPLANT
GLOVE BIOGEL PI IND STRL 8.5 (GLOVE) ×1 IMPLANT
GLOVE BIOGEL PI INDICATOR 8.5 (GLOVE) ×2
GLOVE SURG ORTHO 8.0 STRL STRW (GLOVE) ×2 IMPLANT
GOWN STRL REUS W/ TWL LRG LVL3 (GOWN DISPOSABLE) ×3 IMPLANT
GOWN STRL REUS W/ TWL XL LVL3 (GOWN DISPOSABLE) ×1 IMPLANT
GOWN STRL REUS W/TWL LRG LVL3 (GOWN DISPOSABLE) ×2
GOWN STRL REUS W/TWL XL LVL3 (GOWN DISPOSABLE) ×2
KIT BASIN OR (CUSTOM PROCEDURE TRAY) ×2 IMPLANT
KIT TURNOVER KIT B (KITS) ×2 IMPLANT
NDL HYPO 25GX1X1/2 BEV (NEEDLE) IMPLANT
NEEDLE HYPO 25GX1X1/2 BEV (NEEDLE) ×1 IMPLANT
NS IRRIG 1000ML POUR BTL (IV SOLUTION) ×2 IMPLANT
PACK ORTHO EXTREMITY (CUSTOM PROCEDURE TRAY) ×2 IMPLANT
PAD ARMBOARD 7.5X6 YLW CONV (MISCELLANEOUS) ×4 IMPLANT
PADDING CAST COTTON 6X4 STRL (CAST SUPPLIES) ×1 IMPLANT
SUT PROLENE 3 0 PS 2 (SUTURE) ×2 IMPLANT
SWAB COLLECTION DEVICE MRSA (MISCELLANEOUS) ×2 IMPLANT
SYR CONTROL 10ML LL (SYRINGE) ×1 IMPLANT
TOWEL GREEN STERILE (TOWEL DISPOSABLE) ×2 IMPLANT
TOWEL GREEN STERILE FF (TOWEL DISPOSABLE) ×2 IMPLANT
TUBE CONNECTING 12X1/4 (SUCTIONS) ×2 IMPLANT
UNDERPAD 30X36 HEAVY ABSORB (UNDERPADS AND DIAPERS) ×2 IMPLANT
YANKAUER SUCT BULB TIP NO VENT (SUCTIONS) ×2 IMPLANT

## 2022-04-13 NOTE — Progress Notes (Signed)
Mobility Specialist Progress Note   04/13/22 1200  Mobility  Activity Ambulated with assistance in hallway  Level of Assistance Contact guard assist, steadying assist  Assistive Device Cane  Distance Ambulated (ft) 240 ft  Activity Response Tolerated fair  $Mobility charge 1 Mobility   Pre Mobility: 82 HR, 161/99 BP During Mobility: 94 HR Post Mobility: 84 HR  Received in bed c/o radiating arm pain(9/10) but agreeable to mobility, RN giving meds prior to starting. Standby assist for bed mobility and contact guard during ambulation for safety. Pt ambulates w a slow gait to manage pain as well as w/ this flexed neck position stating pain in neck doesn't allow them to look up, advised better body mechanics for safety. Returned back to bed w/o fault and placed call bell in reach.   Holland Falling Mobility Specialist MS [facility] #:  [phone] Acute Rehab Office:  [phone]

## 2022-04-13 NOTE — Anesthesia Postprocedure Evaluation (Signed)
Anesthesia Post Note  Patient: Antonio Oliver  Procedure(s) Performed: IRRIGATION AND DEBRIDEMENT OF ELBOW AND BURSECTOMY (Left: Elbow)     Patient location during evaluation: PACU Anesthesia Type: General Level of consciousness: awake and alert Pain management: pain level controlled Vital Signs Assessment: post-procedure vital signs reviewed and stable Respiratory status: spontaneous breathing, nonlabored ventilation and respiratory function stable Cardiovascular status: blood pressure returned to baseline and stable Postop Assessment: no apparent nausea or vomiting Anesthetic complications: no   No notable events documented.  Last Vitals:  Vitals:   04/13/22 1900 04/13/22 1915  BP: (!) 150/97 (!) 145/88  Pulse: 86 86  Resp: 19 19  Temp:    SpO2: 95% 97%    Last Pain:  Vitals:   04/13/22 1915  TempSrc:   PainSc: 4                  Noma Quijas,W. EDMOND

## 2022-04-13 NOTE — Assessment & Plan Note (Signed)
Patient is managed on Wellbutrin, Buspar, Cymbalta and Klonopin, although medication history is not completed. -Follow-up mediation history

## 2022-04-13 NOTE — Op Note (Addendum)
PREOPERATIVE DIAGNOSIS: Left elbow septic bursitis  POSTOPERATIVE DIAGNOSIS: Same  ATTENDING SURGEON: Dr. Iran Planas who was scrubbed and present for the entire procedure  ASSISTANT SURGEON: None  ANESTHESIA: General via LMA  OPERATIVE PROCEDURE: Left elbow olecranon bursectomy Left elbow incision and drainage of posterior elbow abscess region.  IMPLANTS: None  EBL: Minimal  RADIOGRAPHIC INTERPRETATION: None  SURGICAL INDICATIONS: Patient is a right-hand-dominant gentleman who sustained a fall on the left elbow.  Patient presented to the ER with a worsening soft tissue infection.  Patient was seen and evaluated and recommended undergo the above procedure.  Risks of surgery include but not limited to bleeding infection damage nearby nerves arteries or tendons loss of motion of the wrist and digits incomplete relief of symptoms and need for further surgical intervention.  SURGICAL TECHNIQUE: The patient was prepped identified in the preoperative holding area marked the part a marker mental left elbow and indicated the correct operative site.  Patient brought back the operating placed supine on the anesthesia table where the general anesthetic was administered.  Patient tolerates well.  Well-padded tourniquet placed on the left brachium and stable the appropriate drape.  The left upper extremities then prepped and draped normal sterile fashion.  A timeout was called the correct site was identified and procedure then began.  The tourniquet was insufflated curvilinear incision made directly with a posterior olecranon tip curving radially.  Dissection carried down through the skin and subcutaneous tissue where the abscess region and infection was noted.  Wound cultures were taken.  Aerobic and anaerobic cultures taken.  Incision and drainage of the abscess region was then carried out.  Following this circumferential bursectomy was then done.  The olecranon was then carefully debrided with  electrocautery and sharp scissors and rongeurs.  After bursectomy hemostasis obtained with bipolar and electrocautery.  The wound was then thoroughly irrigated with 3 L of saline solution.  Following this the wound was loosely reapproximated and closed with simple Prolene suture.  Xeroform dressing and sterile compressive bandaging applied.  The patient placed in a well-padded posterior splint extubated taken recovery in good condition.  POSTOPERATIVE PLAN: Patient be admitted back to the internal medicine service.  Continue with the IV antibiotics until the wound cultures come back.  I will need to look at the wound in 1 week.  He can follow-up in the office in 1 week go home on oral antibiotics based on the wound cultures.  Please contact me should any issues or concerns arise.

## 2022-04-13 NOTE — Progress Notes (Signed)
Anesthesia made aware of [employer] pacer. No new orders at this time.

## 2022-04-13 NOTE — Assessment & Plan Note (Signed)
S/p bioprosthetic AVR.

## 2022-04-13 NOTE — Assessment & Plan Note (Addendum)
Developed secondary to recent fall prior to admission. No evidence of osteomyelitis on CT imaging. Empiric antibiotics held on admission. Mild fever without leukocytosis CT L elbow with post traumatic changes involving the soft tissues of the LU extremity.  Blood cx x1 NG x4 S/p R elbow olecranon bursectomy, right elbow incision and drainage of posterior elbow abscess region 8/8 8/8 cx with rare gram positive cocci in pairs - MR staph aureus, sensitive to tetracycline.  Will discharge on doxycycline. Needs orthopedic follow up

## 2022-04-13 NOTE — Care Management Obs Status (Signed)
[place] NOTIFICATION   Patient Details  Name: Antonio Oliver MRN: [id number] Date of Birth: [birth year]   Medicare Observation Status Notification Given:  Yes    Tom-Johnson, Renea Ee, RN 04/13/2022, 4:10 PM

## 2022-04-13 NOTE — Hospital Course (Addendum)
CONNER MUEGGE is Jossalyn Forgione 66 y.o. male with medical history significant for CHB s/p PPM, severe AS s/p bioprosthetic AVR 03/2017, ascending aortic aneurysm, RBBB, T2DM, HTN, HLD, BPH, migraines, depression/anxiety, chronic pain who is admitted with septic bursitis of the left elbow.  Cultures grew MRSA, sensitive to tetracycline.  Discharging with 21 days doxycycline.  Needs to follow up with Dr. Caralyn Guile outpatient.  See below for additional details

## 2022-04-13 NOTE — Assessment & Plan Note (Addendum)
Patient is listed as taking Wellbutrin, Buspar, and Klonopin. Still awaiting medication history completion. Note sent to pharmacy.

## 2022-04-13 NOTE — Progress Notes (Signed)
PROGRESS NOTE    Antonio Oliver  NID:[id number] DOB: Sep 16, 1955 DOA: 04/12/2022 PCP: [name], MD   Brief Narrative: Antonio Oliver is a 66 y.o. male with medical history significant for CHB s/p PPM, severe AS s/p bioprosthetic AVR 03/2017, ascending aortic aneurysm, RBBB, T2DM, HTN, HLD, BPH, migraines, depression/anxiety, chronic pain who is admitted with septic bursitis of the left elbow.   Assessment and Plan: * Septic bursitis of elbow Developed secondary to recent fall prior to admission. No evidence of osteomyelitis on CT imaging. Empiric antibiotics held on admission. Mild fever without leukocytosis. Orthopedic surgery consulted and recommendations are pending. Blood cultures obtained on admission and are pending. -Follow-up orthopedic surgery recommendations (Dr. Alvan Dame consulted on admission) -Follow up blood cultures -Start empiric Ceftriaxone since there is associated cellulitis -NPO  DM2 (diabetes mellitus, type 2) ([place]) Patient is on metformin and Ozempic as an outpatient; both held on admission. Most recent hemoglobin A1C of 6.3%. Started on SSI on admission. -Continue SSI  Hypertension associated with diabetes ([place]) Patient is managed on Toprol XL 25 mg BID and hydralazine BID PRN -Continue Toprol XL -Hydralazine PO prn  Complete heart block ([place]) History of pacemaker placement.  History of aortic stenosis S/p bioprosthetic AVR.  Anxiety and depression Patient is listed as taking Wellbutrin, Buspar, and Klonopin. Still awaiting medication history completion. Note sent to pharmacy.  BPH (benign prostatic hyperplasia) Patient is on Rapaflo and Vesicare as an outpatient.  Ascending aorta dilation ([facility]) Stable. Diameter of 4.3 cm. Patient follows with cardiology.  Chronic pain -Continue Percocet and Dilaudid IV -Continue Lyrica -Resume home Cymbalta once medication history is complete  Hypothyroidism -Continue Synthroid  GERD (gastroesophageal  reflux disease) Patient is on Omeprazole as an outpatient. -Protonix while inpatient  Anxiety Patient is managed on Wellbutrin, Buspar, Cymbalta and Klonopin, although medication history is not completed. -Follow-up mediation history  Hyperlipidemia associated with type 2 diabetes mellitus ([place]) Patient is on Lipitor as an outpatient. -Continue Lipitor     DVT prophylaxis: SCDs Code Status:   Code Status: DNR Family Communication: None at bedside Disposition Plan: Discharge pending orthopedic surgery recommendations/management   Consultants:  Orthopedic surgery (Dr. Alvan Dame)  Procedures:  None  Antimicrobials: None    Subjective: Patient reports continued pain of left elbow with no significant improvement.  Objective: BP 134/78 (BP Location: Right Arm)   Pulse 79   Temp 98.2 F (36.8 C)   Resp 13   Ht '6\' 3"'$  (1.905 m)   Wt 92.6 kg   SpO2 90%   BMI 25.52 kg/m   Examination:  General exam: Appears calm and comfortable Respiratory system: Clear to auscultation. Respiratory effort normal. Cardiovascular system: S1 & S2 heard, RRR. No murmurs, rubs, gallops or clicks. Gastrointestinal system: Abdomen is nondistended, soft and nontender. No organomegaly or masses felt. Normal bowel sounds heard. Central nervous system: Alert and oriented. No focal neurological deficits. Musculoskeletal: No edema. No calf tenderness. Left elbow is swollen Skin: No cyanosis. Erythema of left elbow. Psychiatry: Judgement and insight appear normal. Mood & affect appropriate.    Data Reviewed: I have personally reviewed following labs and imaging studies  CBC Lab Results  Component Value Date   WBC 9.9 04/12/2022   RBC 4.38 04/12/2022   HGB 11.7 (L) 04/12/2022   HCT 36.3 (L) 04/12/2022   MCV 82.9 04/12/2022   MCH 26.7 04/12/2022   PLT 200 04/12/2022   MCHC 32.2 04/12/2022   RDW 14.3 04/12/2022   LYMPHSABS 1.7 04/12/2022  MONOABS 1.0 04/12/2022   EOSABS 0.4 04/12/2022    BASOSABS 0.1 65/78/4696     Last metabolic panel Lab Results  Component Value Date   NA 138 04/12/2022   K 4.6 04/12/2022   CL 100 04/12/2022   CO2 26 04/12/2022   BUN 15 04/12/2022   CREATININE 1.11 04/12/2022   GLUCOSE 102 (H) 04/12/2022   GFRNONAA >60 04/12/2022   GFRAA >60 05/11/2020   CALCIUM 9.8 04/12/2022   PROT 6.8 11/23/2021   ALBUMIN 4.5 11/23/2021   BILITOT 0.3 11/23/2021   ALKPHOS 88 11/23/2021   AST 26 11/23/2021   ALT 39 11/23/2021   ANIONGAP 12 04/12/2022    GFR: Estimated Creatinine Clearance: 79.3 mL/min (by C-G formula based on SCr of 1.11 mg/dL).  No results found for this or any previous visit (from the past 240 hour(s)).    Radiology Studies: CT Cervical Spine Wo Contrast  Result Date: 04/12/2022 CLINICAL DATA:  Status post fall. EXAM: CT CERVICAL SPINE WITHOUT CONTRAST TECHNIQUE: Multidetector CT imaging of the cervical spine was performed without intravenous contrast. Multiplanar CT image reconstructions were also generated. RADIATION DOSE REDUCTION: This exam was performed according to the departmental dose-optimization program which includes automated exposure control, adjustment of the mA and/or kV according to patient size and/or use of iterative reconstruction technique. COMPARISON:  November 15, 2018 FINDINGS: Alignment: Normal. Skull base and vertebrae: No acute fracture. A metallic density fusion plate and screws are seen along the anterior aspects of the C3, C4, C5 and C6 vertebral bodies. Soft tissues and spinal canal: No prevertebral fluid or swelling. No visible canal hematoma. Disc levels: Moderate severity endplate sclerosis is seen at the level of C2-C3. There is marked severity narrowing of the anterior atlantoaxial articulation. Anterior surgical fusion is seen from the level of C3 through C6. Arthrodesis of the C6-C7 level is seen. This is noted on the prior study. Bilateral marked severity multilevel facet joint hypertrophy is noted. Upper  chest: Negative. Other: None. IMPRESSION: 1. Interval anterior surgical fusion from the level of C3 through C6 since the prior study. 2. Arthrodesis of the C6-C7 level. 3. No acute cervical spine fracture. Electronically Signed   By: Virgina Norfolk M.D.   On: 04/12/2022 18:18   CT Elbow Left Wo Contrast  Result Date: 04/12/2022 CLINICAL DATA:  Status post fall. EXAM: CT OF THE UPPER LEFT EXTREMITY WITHOUT CONTRAST TECHNIQUE: Multidetector CT imaging of the upper left extremity was performed according to the standard protocol. RADIATION DOSE REDUCTION: This exam was performed according to the departmental dose-optimization program which includes automated exposure control, adjustment of the mA and/or kV according to patient size and/or use of iterative reconstruction technique. COMPARISON:  None Available. FINDINGS: Bones/Joint/Cartilage There is no evidence of acute fracture, dislocation or focal bone lesion. Ligaments Suboptimally assessed by CT. Muscles and Tendons Limited in evaluation in the absence of intravenous contrast and otherwise unremarkable. Soft tissues Moderate severity soft tissue swelling, with marked severity edema and inflammatory fat stranding, is seen involving the dorsal aspects of the left elbow and visualized portion of the left forearm. This extends to involve the soft tissues along the posterior and medial aspects of the distal left humerus. A mild to moderate amount of soft tissue air is also seen within this region. Mild to moderate severity volar soft tissue swelling, edema and inflammatory fat stranding is seen at the level of the left elbow and visualized portion of the left forearm. IMPRESSION: 1. Post-traumatic changes involving the soft tissues  of the left upper extremity, as described above, without an acute osseous abnormality. Electronically Signed   By: Virgina Norfolk M.D.   On: 04/12/2022 18:11   CT Head Wo Contrast  Result Date: 04/12/2022 CLINICAL DATA:  Status  post fall. EXAM: CT HEAD WITHOUT CONTRAST TECHNIQUE: Contiguous axial images were obtained from the base of the skull through the vertex without intravenous contrast. RADIATION DOSE REDUCTION: This exam was performed according to the departmental dose-optimization program which includes automated exposure control, adjustment of the mA and/or kV according to patient size and/or use of iterative reconstruction technique. COMPARISON:  December 25, 2020 FINDINGS: Brain: There is mild cerebral atrophy with widening of the extra-axial spaces and ventricular dilatation. There are areas of decreased attenuation within the white matter tracts of the supratentorial brain, consistent with microvascular disease changes. Vascular: No hyperdense vessel or unexpected calcification. Skull: Normal. Negative for fracture or focal lesion. Sinuses/Orbits: No acute finding. Other: None. IMPRESSION: 1. No acute intracranial abnormality. 2. Generalized cerebral atrophy with chronic white matter small vessel ischemic changes. Electronically Signed   By: Virgina Norfolk M.D.   On: 04/12/2022 17:58   DG Elbow Complete Left  Result Date: 04/12/2022 CLINICAL DATA:  Left elbow pain and swelling from fall. EXAM: LEFT ELBOW - COMPLETE 3+ VIEW COMPARISON:  Lost balance few days ago. Fell indication floor injuring left elbow. FINDINGS: Normal bone mineralization. There is elevation of the distal anterior humeral fat pad an elbow joint effusion. Mild distal coronoid process degenerative spurring. Mild peripheral medial elbow trochlear and coronoid process degenerative osteophytosis. Minimal chronic enthesopathic change at the common extensor tendon origin at the lateral epicondyle. Moderate to high-grade distal posterior upper arm and elbow soft tissue swelling with low-density subcutaneous fat overlying likely edema. No definite acute fracture line is seen.  No dislocation. IMPRESSION: 1. Moderate to high-grade posterior distal upper arm and  elbow soft tissue swelling and edema. 2. There is an elbow joint effusion. 3. No definite acute fracture line is seen. Given the history of recent trauma, it is difficult to entirely exclude a radiographically occult fracture. Recommend clinical correlation. Electronically Signed   By: Yvonne Kendall M.D.   On: 04/12/2022 15:43      LOS: 0 days    Cordelia Poche, MD [facility] 04/13/2022, 7:24 AM   If 7PM-7AM, please contact night-coverage [email or web address]

## 2022-04-13 NOTE — Transfer of Care (Signed)
Immediate Anesthesia Transfer of Care Note  Patient: Antonio Oliver  Procedure(s) Performed: IRRIGATION AND DEBRIDEMENT OF ELBOW AND BURSECTOMY (Left: Elbow)  Patient Location: PACU  Anesthesia Type:General  Level of Consciousness: awake, alert  and oriented  Airway & Oxygen Therapy: Patient Spontanous Breathing and Patient connected to face mask oxygen  Post-op Assessment: Report given to RN, Post -op Vital signs reviewed and stable, Patient moving all extremities X 4 and Patient able to stick tongue midline  Post vital signs: Reviewed  Last Vitals:  Vitals Value Taken Time  BP 140/92 04/13/22 1822  Temp 37.8   Pulse 79 04/13/22 1824  Resp 14 04/13/22 1824  SpO2 100 % 04/13/22 1824  Vitals shown include unvalidated device data.  Last Pain:  Vitals:   04/13/22 1305  TempSrc:   PainSc: 7       Patients Stated Pain Goal: 0 (44/01/02 7253)  Complications: No notable events documented.

## 2022-04-13 NOTE — Plan of Care (Signed)
  Problem: Education: Goal: Knowledge of General Education information will improve Description: Including pain rating scale, medication(s)/side effects and non-pharmacologic comfort measures Outcome: Progressing   Problem: Activity: Goal: Risk for activity intolerance will decrease Outcome: Progressing   Problem: Coping: Goal: Level of anxiety will decrease Outcome: Progressing   Problem: Elimination: Goal: Will not experience complications related to bowel motility Outcome: Progressing   Problem: Safety: Goal: Ability to remain free from injury will improve Outcome: Progressing   Problem: Skin Integrity: Goal: Risk for impaired skin integrity will decrease Outcome: Progressing

## 2022-04-13 NOTE — Assessment & Plan Note (Addendum)
History of pacemaker placement   

## 2022-04-13 NOTE — Anesthesia Procedure Notes (Signed)
Procedure Name: LMA Insertion Date/Time: 04/13/2022 5:35 PM  Performed by: Maude Leriche, CRNAPre-anesthesia Checklist: Patient identified, Emergency Drugs available, Suction available, Patient being monitored and Timeout performed Patient Re-evaluated:Patient Re-evaluated prior to induction Oxygen Delivery Method: Circle system utilized Preoxygenation: Pre-oxygenation with 100% oxygen Induction Type: IV induction LMA: LMA inserted LMA Size: 5.0 Number of attempts: 1 Placement Confirmation: breath sounds checked- equal and bilateral and positive ETCO2 Tube secured with: Tape Dental Injury: Teeth and Oropharynx as per pre-operative assessment

## 2022-04-13 NOTE — Assessment & Plan Note (Addendum)
Home percocet, tramadol prn lyrica cymbalta

## 2022-04-13 NOTE — Anesthesia Preprocedure Evaluation (Signed)
Anesthesia Evaluation  Patient identified by MRN, date of birth, ID band Patient awake    Reviewed: Allergy & Precautions, H&P , NPO status , Patient's Chart, lab work & pertinent test results  Airway Mallampati: II   Neck ROM: full    Dental   Pulmonary neg pulmonary ROS,    breath sounds clear to auscultation       Cardiovascular hypertension, + pacemaker + Valvular Problems/Murmurs  Rhythm:regular Rate:Normal  H/o AVR for bicuspid aortic valve.   Neuro/Psych  Headaches, PSYCHIATRIC DISORDERS Anxiety Depression  Neuromuscular disease    GI/Hepatic GERD  ,  Endo/Other  diabetes, Type 2Hypothyroidism   Renal/GU      Musculoskeletal  (+) Arthritis ,   Abdominal   Peds  Hematology   Anesthesia Other Findings   Reproductive/Obstetrics                             Anesthesia Physical Anesthesia Plan  ASA: 3  Anesthesia Plan: General   Post-op Pain Management:    Induction: Intravenous  PONV Risk Score and Plan: 2 and Ondansetron, Dexamethasone, Midazolam and Treatment may vary due to age or medical condition  Airway Management Planned: LMA  Additional Equipment:   Intra-op Plan:   Post-operative Plan: Extubation in OR  Informed Consent: I have reviewed the patients History and Physical, chart, labs and discussed the procedure including the risks, benefits and alternatives for the proposed anesthesia with the patient or authorized representative who has indicated his/her understanding and acceptance.     Dental advisory given  Plan Discussed with: CRNA, Anesthesiologist and Surgeon  Anesthesia Plan Comments:         Anesthesia Quick Evaluation

## 2022-04-13 NOTE — H&P (Incomplete)
History and Physical    Antonio Oliver LKJ:[id number] DOB: December 26, 1955 DOA: 04/12/2022  PCP: [name], MD  Patient coming from: Home  I have personally briefly reviewed patient's old medical records in [place]  Chief Complaint: Left elbow injury  HPI: Antonio Oliver is a 66 y.o. male with medical history significant for CHB s/p PPM, severe AS s/p bioprosthetic AVR 03/2017, ascending aortic aneurysm, RBBB, T2DM, HTN, HLD, BPH, migraines, depression/anxiety, chronic pain who presented to the ED for evaluation of a left elbow injury.  ***  [facility] ED Course  Labs/Imaging on admission: I have personally reviewed following labs and imaging studies.  ***  Review of Systems: All systems reviewed and are negative except as documented in history of present illness above.   Past Medical History:  Diagnosis Date  . Anxiety and depression   . Ascending aorta dilation (HCC) 03/06/2019   4.3 cm 2022  . Benign prostatic hyperplasia without lower urinary tract symptoms 11/17/2016   Last Assessment & Plan:  Stable PSA today.  Formatting of this note might be different from the original. Last Assessment & Plan:  Stable PSA today. Last Assessment & Plan:  Formatting of this note might be different from the original. Stable PSA today.  Marland Kitchen BPH (benign prostatic hyperplasia)   . Bradycardia 02/01/2019  . Cervical myelopathy ([place]) 11/14/2018  . Charcot's joint of foot 03/05/2019  . Chronic back pain 04/03/2013  . Chronic inflammatory arthritis 11/17/2016   History of positive rheumatoid factor in the past. Denies placement on immunosuppressive therapy  . Chronic pain syndrome    Failed back surgical syndrome.  Chronic neck pain.  Chronic low back pain with sciatica bilateral  . Colon cancer screening    2023 cologuard neg  . Complete heart block [facility])    Following AVR s/p [employer] PPM in 03/2017  . DM2 (diabetes mellitus, type 2) ([place]) 04/03/2013   Last Assessment &  Plan:  Formatting of this note might be different from the original. Diabetes is unchanged.  Continue current treatment regimen. Regular aerobic exercise. Diabetes will be reassessed in 3 months. Will check A1c today. Denies any problems with feet or sensation.  . Elevated PSA 11/2021  . Enthesopathy of ankle and tarsus 04/04/2013   Last Assessment & Plan:  Continues with swelling after prolonged standing.  Still  Wearing support hose, which helps.  . Essential hypertension 10/30/2018  . FHx: migraine headaches 04/04/2013  . Fracture of capitate bone of wrist 02/26/2019  . Fracture of tibia 03/05/2019  . GERD (gastroesophageal reflux disease) 10/30/2018  . Glaucoma 04/03/2013  . Hemangioma of skin and subcutaneous tissue 07/07/2017  . History of aortic valve replacement 2018   bicuspid AV  . History of kidney stones 11/17/2016  . History of sleep apnea    wt loss->gone  . Hypothyroidism 10/30/2018  . IDA (iron deficiency anemia) 11/2021   malabs d/t gastric bipass  . Lesion of plantar nerve 04/04/2013  . Lumbar stenosis with neurogenic claudication 01/08/2021  . Major depressive disorder, recurrent (HCC)    ECT in the remote past.  Patient disabled due to recurrent depression and chronic pain.  . Migraine syndrome   . Mixed dyslipidemia 10/30/2018  . Morbid obesity ([place]) 10/27/2011  . Nondisplaced fracture of medial cuneiform of left foot, initial encounter for closed fracture 11/16/2018  . Osteoarthritis 10/30/2018  . Personal history of tobacco use, presenting hazards to health 04/04/2013  . Presence of permanent cardiac pacemaker   .  Severe single current episode of major depressive disorder, without psychotic features ([place]) 11/17/2016  . Status post bariatric surgery 04/05/2011   bipass surg --presurg wt 310 lbs  . Syncope 02/14/2019  . Vitamin D deficiency 10/30/2018    Past Surgical History:  Procedure Laterality Date  . ANTERIOR CERVICAL DECOMP/DISCECTOMY FUSION N/A  11/17/2018   Procedure: CERVICAL THREE-CERVICAL FOUR, CERVICAL FOUR-CERVICAL FIVE, CERVICAL FIVE-CERVICAL SIX ANTERIOR CERVICAL DECOMPRESSION/DISCECTOMY FUSION;  Surgeon: Earnie Larsson, MD;  Location: [place];  Service: Neurosurgery;  Laterality: N/A;  . AORTIC VALVE REPLACEMENT  03/2017  . APPLICATION OF WOUND VAC Left 04/05/2019   Procedure: Application Of Wound Vac;  Surgeon: Erle Crocker, MD;  Location: [place];  Service: Orthopedics;  Laterality: Left;  . BACK SURGERY     x4  . CARDIOVASCULAR STRESS TEST  05/05/2021   lexiscan neg  . FASCIOTOMY Left 04/05/2019   Left leg 2 compartment fasciotomy  . FASCIOTOMY Left 04/05/2019   Procedure: FASCIOTOMY LEFT LOWER LEG;  Surgeon: Erle Crocker, MD;  Location: [place];  Service: Orthopedics;  Laterality: Left;  . FOOT NEUROMA SURGERY    . GASTRIC BYPASS  2012  . HEMATOMA EVACUATION Left 04/05/2019   Procedure: Evacuation Hematoma Left Lower Leg;  Surgeon: Erle Crocker, MD;  Location: [place];  Service: Orthopedics;  Laterality: Left;  . HEMORRHOID SURGERY     over 30 years ago  . HERNIA REPAIR     LIH umb  . LUMBAR LAMINECTOMY/DECOMPRESSION MICRODISCECTOMY N/A 01/08/2021   Procedure: CENTRAL LUMBAR LAMINECTOMY LUMBAR TWO-THREE, LUMBAR THREE-FOUR;  Surgeon: Jessy Oto, MD;  Location: [place];  Service: Orthopedics;  Laterality: N/A;  . NASAL SINUS SURGERY     x2  . NECK SURGERY    . TRANSTHORACIC ECHOCARDIOGRAM  12/2020   EF normal, mod MR, ascending AA (4.4 cm)    Social History:  reports that he has never smoked. He has never used smokeless tobacco. He reports current alcohol use of about 1.0 standard drink of alcohol per week. He reports that he does not use drugs.  Allergies  Allergen Reactions  . Morphine Itching    Can take with benadry, facial and nose itching     Family History  Problem Relation Age of Onset  . Cancer Mother        breast  . Rheum arthritis Mother   . Diabetes Mother   . Heart disease  Father   . Kidney disease Father   . Diabetes Father   . Hypertension Brother   . Diabetes Brother   . Osteoarthritis Brother   . Heart disease Brother   . Hypertension Brother   . Diabetes Brother   . Rheum arthritis Brother   . Depression Daughter   . Anxiety disorder Daughter      Prior to Admission medications   Medication Sig Start Date End Date Taking? Authorizing Provider  aspirin EC 81 MG tablet Take 81 mg by mouth daily.    [provider]  atorvastatin (LIPITOR) 20 MG tablet Take 20 mg by mouth daily at 6 PM.     [provider]  brexpiprazole (REXULTI) 2 MG TABS tablet Take 2 mg by mouth daily.     [provider]  buPROPion (WELLBUTRIN SR) 200 MG 12 hr tablet Take 200 mg by mouth 2 (two) times daily.    [provider]  busPIRone (BUSPAR) 10 MG tablet Take 20 mg by mouth 2 (two) times daily.     [provider]  calcium citrate (CALCITRATE - DOSED IN MG ELEMENTAL CALCIUM) 950 MG tablet Take 200 mg of elemental calcium by mouth 2 (two) times daily.    [provider]  Cholecalciferol 125 MCG (5000 UT) TABS Take 125 mcg by mouth daily.    [provider]  clonazePAM (KLONOPIN) 0.5 MG tablet Take 0.5 mg by mouth 2 (two) times daily as needed for anxiety.    [provider]  diclofenac Sodium (VOLTAREN) 1 % GEL Apply 1 application topically 4 (four) times daily. 12/25/20   [provider]  docusate sodium (COLACE) 100 MG capsule Take 1 capsule (100 mg total) by mouth 2 (two) times daily. 01/13/21   Ezekiel Slocumb, DO  DULoxetine (CYMBALTA) 60 MG capsule Take 60 mg by mouth 2 (two) times daily.     [provider]  fexofenadine (ALLEGRA) 180 MG tablet Take 180 mg by mouth daily as needed for allergies.    [provider]  finasteride (PROSCAR) 5 MG tablet Take 5 mg by mouth daily.    [provider]  fluticasone (FLONASE) 50 MCG/ACT nasal spray Place 2 sprays into the nose  daily as needed for allergies or rhinitis.    [provider]  folic acid (FOLVITE) 1 MG tablet Take 1 mg by mouth daily.    [provider]  furosemide (LASIX) 20 MG tablet Take 1 tablet (20 mg total) by mouth as needed (for leg swelling). 07/12/19   Revankar, Reita Cliche, MD  Galcanezumab-gnlm (EMGALITY) 120 MG/ML SOAJ Inject 120 mg into the skin every 28 (twenty-eight) days. 12/15/21   Pieter Partridge, DO  hydrALAZINE (APRESOLINE) 25 MG tablet Take 0.5 tablets (12.5 mg total) by mouth 2 (two) times daily as needed. 03/31/22   Darreld Mclean, PA-C  levothyroxine (SYNTHROID) 125 MCG tablet Take 125 mcg by mouth daily.     [provider]  Magnesium 500 MG CAPS Take 500 mg by mouth 2 (two) times a day.     [provider]  metFORMIN (GLUCOPHAGE) 1000 MG tablet Take 1,000 mg by mouth 2 (two) times daily with a meal.    [provider]  metoprolol succinate (TOPROL-XL) 50 MG 24 hr tablet Take 25 mg by mouth 2 (two) times daily.    [provider]  mirtazapine (REMERON) 30 MG tablet Take 30 mg by mouth at bedtime.     [provider]  Multiple Vitamins-Minerals (MULTIVITAMIN WITH MINERALS) tablet Take 1 tablet by mouth daily.    [provider]  mupirocin ointment (BACTROBAN) 2 % Apply 1 application topically 3 (three) times daily as needed (infection). 11/14/20   [provider]  naloxone [facility]) nasal spray 4 mg/0.1 mL Instill or spray into the nose if you are experiencing shortness of breath or signs of opioid overdose. 01/21/21   Jessy Oto, MD  omeprazole (PRILOSEC) 20 MG capsule Take 20 mg by mouth 2 (two) times daily before a meal.     [provider]  oxyCODONE-acetaminophen (PERCOCET) 10-325 MG tablet Take 1 tablet by mouth 3 (three) times daily.    [provider]  polyethylene glycol (MIRALAX / GLYCOLAX) 17 g packet Take 17 g by mouth daily. 01/14/21   Ezekiel Slocumb, DO  potassium chloride SA  (KLOR-CON) 20 MEQ tablet Take 20 mEq by mouth daily as needed (potassium defficiency). With lasix    [provider]  pregabalin (LYRICA) 75 MG capsule Take 100 mg by mouth 3 (three)  times daily. 11/26/21   [provider]  Semaglutide (OZEMPIC, 0.25 OR 0.5 MG/DOSE, [place]) Inject 0.5 mg into the skin once a week.    [provider]  silodosin (RAPAFLO) 8 MG CAPS capsule Take 8 mg by mouth daily. 09/22/21   [provider]  solifenacin (VESICARE) 10 MG tablet Take 10 mg by mouth daily.    [provider]  tiZANidine (ZANAFLEX) 4 MG capsule Take 4-8 mg by mouth 3 (three) times daily as needed for muscle spasms.    [provider]  traMADol (ULTRAM) 50 MG tablet Take 50 mg by mouth 3 (three) times daily.    [provider]  Ubrogepant (UBRELVY) 100 MG TABS Take 1 tablet by mouth as needed (May repeat in 2 hours.  Maximum 2 tablets in 24 hours.). 04/22/21   Pieter Partridge, DO    Physical Exam: Vitals:   04/12/22 2145 04/12/22 2230 04/12/22 2245 04/12/22 2347  BP: 133/89 (!) 142/86 137/87 (!) 158/93  Pulse: 81 80 80 79  Resp: '17 16 16 18  '$ Temp:   99.3 F (37.4 C) 98.2 F (36.8 C)  TempSrc:   Oral   SpO2: 92% 94% 94% 98%  Weight:      Height:       *** Constitutional: NAD, calm, comfortable Eyes: PERRL, lids and conjunctivae normal ENMT: Mucous membranes are moist. Posterior pharynx clear of any exudate or lesions.Normal dentition.  Neck: normal, supple, no masses. Respiratory: clear to auscultation bilaterally, no wheezing, no crackles. Normal respiratory effort. No accessory muscle use.  Cardiovascular: Regular rate and rhythm, no murmurs / rubs / gallops. No extremity edema. 2+ pedal pulses. Abdomen: no tenderness, no masses palpated. No hepatosplenomegaly. Bowel sounds positive.  Musculoskeletal: no clubbing / cyanosis. No joint deformity upper and lower extremities. Good ROM, no contractures. Normal muscle tone.  Skin: no rashes,  lesions, ulcers. No induration Neurologic: CN 2-12 grossly intact. Sensation intact. Strength 5/5 in all 4.  Psychiatric: Normal judgment and insight. Alert and oriented x 3. Normal mood.       EKG: Personally reviewed. ***  Assessment/Plan Principal Problem:   Septic bursitis of elbow   *** No notes on file *** Assessment and Plan: No notes have been filed under this hospital service. Service: Hospitalist DVT prophylaxis: ***  Code Status: ***  Family Communication: ***  Disposition Plan: ***  Consults called: ***  Severity of Illness: {Observation/Inpatient:21159}  Zada Finders MD [facility]  If 7PM-7AM, please contact night-coverage [email or web address]  04/12/2022, 11:55 PM

## 2022-04-13 NOTE — Assessment & Plan Note (Addendum)
Stable. Diameter of 4.3 cm on 12/2021 CT scan. Patient follows with cardiology.

## 2022-04-13 NOTE — Consult Note (Addendum)
Antonio for Consult:Left olecranon bursitis Referring Physician: Cordelia Poche Time called: 7209 Time at bedside: 1511   Antonio Oliver is an 66 y.o. male.  HPI: Kala fell about 3d ago while ambulating with his cane. He fell onto his left elbow and suffered a relatively minor abrasion. This healed but the elbow continued to hurt and swell. He came to the ED yesterday and was admitted last night and orthopedic surgery was consulted. He is RHD. He denies fevers, chills, sweats, N/V.  Past Medical History:  Diagnosis Date   Anxiety and depression    Ascending aorta dilation ([place]) 03/06/2019   4.3 cm 2022   Benign prostatic hyperplasia without lower urinary tract symptoms 11/17/2016   Last Assessment & Plan:  Stable PSA today.  Formatting of this note might be different from the original. Last Assessment & Plan:  Stable PSA today. Last Assessment & Plan:  Formatting of this note might be different from the original. Stable PSA today.   BPH (benign prostatic hyperplasia)    Bradycardia 02/01/2019   Cervical myelopathy ([place]) 11/14/2018   Charcot's joint of foot 03/05/2019   Chronic back pain 04/03/2013   Chronic inflammatory arthritis 11/17/2016   History of positive rheumatoid factor in the past. Denies placement on immunosuppressive therapy   Chronic pain syndrome    Failed back surgical syndrome.  Chronic neck pain.  Chronic low back pain with sciatica bilateral   Colon cancer screening    2023 cologuard neg   Complete heart block (HCC)    Following AVR s/p St Jude PPM in 03/2017   DM2 (diabetes mellitus, type 2) ([place]) 04/03/2013   Last Assessment & Plan:  Formatting of this note might be different from the original. Diabetes is unchanged.  Continue current treatment regimen. Regular aerobic exercise. Diabetes will be reassessed in 3 months. Will check A1c today. Denies any problems with feet or sensation.   Elevated PSA 11/2021   Enthesopathy of ankle and tarsus 04/04/2013   Last  Assessment & Plan:  Continues with swelling after prolonged standing.  Still  Wearing support hose, which helps.   Essential hypertension 10/30/2018   FHx: migraine headaches 04/04/2013   Fracture of capitate bone of wrist 02/26/2019   Fracture of tibia 03/05/2019   GERD (gastroesophageal reflux disease) 10/30/2018   Glaucoma 04/03/2013   Hemangioma of skin and subcutaneous tissue 07/07/2017   History of aortic valve replacement 2018   bicuspid AV   History of kidney stones 11/17/2016   History of sleep apnea    wt loss->gone   Hypothyroidism 10/30/2018   IDA (iron deficiency anemia) 11/2021   malabs d/t gastric bipass   Lesion of plantar nerve 04/04/2013   Lumbar stenosis with neurogenic claudication 01/08/2021   Major depressive disorder, recurrent ([place])    ECT in the remote past.  Patient disabled due to recurrent depression and chronic pain.   Migraine syndrome    Mixed dyslipidemia 10/30/2018   Morbid obesity ([place]) 10/27/2011   Nondisplaced fracture of medial cuneiform of left foot, initial encounter for closed fracture 11/16/2018   Osteoarthritis 10/30/2018   Personal history of tobacco use, presenting hazards to health 04/04/2013   Presence of permanent cardiac pacemaker    Severe single current episode of major depressive disorder, without psychotic features ([place]) 11/17/2016   Status post bariatric surgery 04/05/2011   bipass surg --presurg wt 310 lbs   Syncope 02/14/2019   Vitamin D deficiency 10/30/2018    Past Surgical History:  Procedure Laterality Date  ANTERIOR CERVICAL DECOMP/DISCECTOMY FUSION N/A 11/17/2018   Procedure: CERVICAL THREE-CERVICAL FOUR, CERVICAL FOUR-CERVICAL FIVE, CERVICAL FIVE-CERVICAL SIX ANTERIOR CERVICAL DECOMPRESSION/DISCECTOMY FUSION;  Surgeon: Earnie Larsson, MD;  Location: [facility];  Service: Neurosurgery;  Laterality: N/A;   AORTIC VALVE REPLACEMENT  03/6225   APPLICATION OF WOUND VAC Left 04/05/2019   Procedure: Application Of Wound Vac;   Surgeon: Erle Crocker, MD;  Location: [facility];  Service: Orthopedics;  Laterality: Left;   BACK SURGERY     x4   CARDIOVASCULAR STRESS TEST  05/05/2021   lexiscan neg   FASCIOTOMY Left 04/05/2019   Left leg 2 compartment fasciotomy   FASCIOTOMY Left 04/05/2019   Procedure: FASCIOTOMY LEFT LOWER LEG;  Surgeon: Erle Crocker, MD;  Location: [facility];  Service: Orthopedics;  Laterality: Left;   FOOT NEUROMA SURGERY     GASTRIC BYPASS  2012   HEMATOMA EVACUATION Left 04/05/2019   Procedure: Evacuation Hematoma Left Lower Leg;  Surgeon: Erle Crocker, MD;  Location: [facility];  Service: Orthopedics;  Laterality: Left;   HEMORRHOID SURGERY     over 30 years ago   [facility]   LUMBAR LAMINECTOMY/DECOMPRESSION MICRODISCECTOMY N/A 01/08/2021   Procedure: CENTRAL LUMBAR LAMINECTOMY LUMBAR TWO-THREE, LUMBAR THREE-FOUR;  Surgeon: Jessy Oto, MD;  Location: [facility];  Service: Orthopedics;  Laterality: N/A;   NASAL SINUS SURGERY     x2   NECK SURGERY     TRANSTHORACIC ECHOCARDIOGRAM  12/2020   EF normal, mod MR, ascending AA (4.4 cm)    Family History  Problem Relation Age of Onset   Cancer Mother        breast   Rheum arthritis Mother    Diabetes Mother    Heart disease Father    Kidney disease Father    Diabetes Father    Hypertension Brother    Diabetes Brother    Osteoarthritis Brother    Heart disease Brother    Hypertension Brother    Diabetes Brother    Rheum arthritis Brother    Depression Daughter    Anxiety disorder Daughter     Social History:  reports that he has never smoked. He has never used smokeless tobacco. He reports current alcohol use of about 1.0 standard drink of alcohol per week. He reports that he does not use drugs.  Allergies:  Allergies  Allergen Reactions   Calcium Carbonate Other (See Comments)    Pt had gastric bypass, can not absorb calcium carbonate - able to absorb calcium citrate   Ms Contin [Morphine] Itching     Can take with Benadryl, facial and nose itching     Medications: I have reviewed the patient's current medications.  Results for orders placed or performed during the hospital encounter of 04/12/22 (from the past 48 hour(s))  Lactic acid, plasma     Status: None   Collection Time: 04/12/22  6:00 PM  Result Value Ref Range   Lactic Acid, Venous 1.3 0.5 - 1.9 mmol/L    Comment: Performed at [facility], [address], [place],  [address]  Basic metabolic panel     Status: Abnormal   Collection Time: 04/12/22  6:04 PM  Result Value Ref Range   Sodium 138 135 - 145 mmol/L   Potassium 4.6 3.5 - 5.1 mmol/L   Chloride 100 98 - 111 mmol/L   CO2 26 22 - 32 mmol/L   Glucose, Bld 102 (H) 70 - 99 mg/dL  Comment: Glucose reference range applies only to samples taken after fasting for at least 8 hours.   BUN 15 8 - 23 mg/dL   Creatinine, Ser 1.11 0.61 - 1.24 mg/dL   Calcium 9.8 8.9 - 10.3 mg/dL   GFR, Estimated >60 >60 mL/min    Comment: (NOTE) Calculated using the CKD-EPI Creatinine Equation (2021)    Anion gap 12 5 - 15    Comment: Performed at [facility], [address], [place], [place] [address]  CBC with Differential     Status: Abnormal   Collection Time: 04/12/22  6:04 PM  Result Value Ref Range   WBC 9.9 4.0 - 10.5 K/uL   RBC 4.38 4.22 - 5.81 MIL/uL   Hemoglobin 11.7 (L) 13.0 - 17.0 g/dL   HCT 36.3 (L) 39.0 - 52.0 %   MCV 82.9 80.0 - 100.0 fL   MCH 26.7 26.0 - 34.0 pg   MCHC 32.2 30.0 - 36.0 g/dL   RDW 14.3 11.5 - 15.5 %   Platelets 200 150 - 400 K/uL   nRBC 0.0 0.0 - 0.2 %   Neutrophils Relative % 68 %   Neutro Abs 6.7 1.7 - 7.7 K/uL   Lymphocytes Relative 17 %   Lymphs Abs 1.7 0.7 - 4.0 K/uL   Monocytes Relative 10 %   Monocytes Absolute 1.0 0.1 - 1.0 K/uL   Eosinophils Relative 4 %   Eosinophils Absolute 0.4 0.0 - 0.5 K/uL   Basophils Relative 1 %   Basophils Absolute 0.1 0.0 - 0.1 K/uL   Immature Granulocytes 0 %    Abs Immature Granulocytes 0.04 0.00 - 0.07 K/uL    Comment: Performed at [facility], [place], [place] [address]  Urinalysis, Routine w reflex microscopic Urine, Clean Catch     Status: None   Collection Time: 04/12/22  6:04 PM  Result Value Ref Range   Color, Urine YELLOW YELLOW   APPearance CLEAR CLEAR   Specific Gravity, Urine 1.015 1.005 - 1.030   pH 5.5 5.0 - 8.0   Glucose, UA NEGATIVE NEGATIVE mg/dL   Hgb urine dipstick NEGATIVE NEGATIVE   Bilirubin Urine NEGATIVE NEGATIVE   Ketones, ur NEGATIVE NEGATIVE mg/dL   Protein, ur NEGATIVE NEGATIVE mg/dL   Nitrite NEGATIVE NEGATIVE   Leukocytes,Ua NEGATIVE NEGATIVE    Comment: Performed at [facility], [address], [place], [place] [address]  Blood culture (routine x 2)     Status: None (Preliminary result)   Collection Time: 04/12/22  6:17 PM   Specimen: BLOOD  Result Value Ref Range   Specimen Description      BLOOD BLOOD RIGHT FOREARM Performed at [facility], [address], [place], [place] [address]    Special Requests      Blood Culture adequate volume BOTTLES DRAWN AEROBIC AND ANAEROBIC Performed at [facility], [address], [place], [place] [address]    Culture      NO GROWTH < 12 HOURS Performed at [facility], [place] [address], [place], [place] [address]    Report Status PENDING   Glucose, capillary     Status: Abnormal   Collection Time: 04/13/22  1:42 AM  Result Value Ref Range   Glucose-Capillary 187 (H) 70 - 99 mg/dL    Comment: Glucose reference range applies only to samples taken after fasting for at least 8 hours.  Glucose, capillary     Status: Abnormal   Collection Time: 04/13/22  4:32 AM  Result Value Ref Range   Glucose-Capillary 133 (H) 70 - 99 mg/dL    Comment: Glucose reference range applies only to samples taken after fasting for at least 8 hours.  HIV Antibody (routine testing w rflx)      Status: None   Collection Time: 04/13/22  6:56 AM  Result Value Ref Range   HIV Screen 4th Generation wRfx Non Reactive Non Reactive    Comment: Performed at [facility], [place] [address] [place], [place] [address]  Basic metabolic panel     Status: Abnormal   Collection Time: 04/13/22  6:56 AM  Result Value Ref Range   Sodium 137 135 - 145 mmol/L   Potassium 4.4 3.5 - 5.1 mmol/L   Chloride 103 98 - 111 mmol/L   CO2 23 22 - 32 mmol/L   Glucose, Bld 135 (H) 70 - 99 mg/dL    Comment: Glucose reference range applies only to samples taken after fasting for at least 8 hours.   BUN 12 8 - 23 mg/dL   Creatinine, Ser 0.90 0.61 - 1.24 mg/dL   Calcium 9.1 8.9 - 10.3 mg/dL   GFR, Estimated >60 >60 mL/min    Comment: (NOTE) Calculated using the CKD-EPI Creatinine Equation (2021)    Anion gap 11 5 - 15    Comment: Performed at [facility] [address], [place], [place] [address]  CBC     Status: Abnormal   Collection Time: 04/13/22  6:56 AM  Result Value Ref Range   WBC 9.1 4.0 - 10.5 K/uL   RBC 4.34 4.22 - 5.81 MIL/uL   Hemoglobin 11.6 (L) 13.0 - 17.0 g/dL   HCT 35.5 (L) 39.0 - 52.0 %   MCV 81.8 80.0 - 100.0 fL   MCH 26.7 26.0 - 34.0 pg   MCHC 32.7 30.0 - 36.0 g/dL   RDW 14.1 11.5 - 15.5 %   Platelets 225 150 - 400 K/uL   nRBC 0.0 0.0 - 0.2 %    Comment: Performed at [facility], [place] [address], [place], [place] [address]  Glucose, capillary     Status: Abnormal   Collection Time: 04/13/22  7:59 AM  Result Value Ref Range   Glucose-Capillary 144 (H) 70 - 99 mg/dL    Comment: Glucose reference range applies only to samples taken after fasting for at least 8 hours.  Glucose, capillary     Status: Abnormal   Collection Time: 04/13/22 12:02 PM  Result Value Ref Range   Glucose-Capillary 102 (H) 70 - 99 mg/dL    Comment: Glucose reference range applies only to samples taken after fasting for at least 8 hours.    CT Cervical Spine Wo Contrast  Result Date:  04/12/2022 CLINICAL DATA:  Status post fall. EXAM: CT CERVICAL SPINE WITHOUT CONTRAST TECHNIQUE: Multidetector CT imaging of the cervical spine was performed without intravenous contrast. Multiplanar CT image reconstructions were also generated. RADIATION DOSE REDUCTION: This exam was performed according to the departmental dose-optimization program which includes automated exposure control, adjustment of the mA and/or kV according to patient size and/or use of iterative reconstruction technique. COMPARISON:  November 15, 2018 FINDINGS: Alignment: Normal. Skull base and vertebrae: No acute fracture. A metallic density fusion plate and screws are seen along the anterior aspects of the C3, C4, C5 and C6 vertebral bodies. Soft tissues and spinal canal: No prevertebral fluid or swelling. No visible canal hematoma. Disc levels: Moderate severity endplate sclerosis is  seen at the level of C2-C3. There is marked severity narrowing of the anterior atlantoaxial articulation. Anterior surgical fusion is seen from the level of C3 through C6. Arthrodesis of the C6-C7 level is seen. This is noted on the prior study. Bilateral marked severity multilevel facet joint hypertrophy is noted. Upper chest: Negative. Other: None. IMPRESSION: 1. Interval anterior surgical fusion from the level of C3 through C6 since the prior study. 2. Arthrodesis of the C6-C7 level. 3. No acute cervical spine fracture. Electronically Signed   By: Virgina Norfolk M.D.   On: 04/12/2022 18:18   CT Elbow Left Wo Contrast  Result Date: 04/12/2022 CLINICAL DATA:  Status post fall. EXAM: CT OF THE UPPER LEFT EXTREMITY WITHOUT CONTRAST TECHNIQUE: Multidetector CT imaging of the upper left extremity was performed according to the standard protocol. RADIATION DOSE REDUCTION: This exam was performed according to the departmental dose-optimization program which includes automated exposure control, adjustment of the mA and/or kV according to patient size and/or use  of iterative reconstruction technique. COMPARISON:  None Available. FINDINGS: Bones/Joint/Cartilage There is no evidence of acute fracture, dislocation or focal bone lesion. Ligaments Suboptimally assessed by CT. Muscles and Tendons Limited in evaluation in the absence of intravenous contrast and otherwise unremarkable. Soft tissues Moderate severity soft tissue swelling, with marked severity edema and inflammatory fat stranding, is seen involving the dorsal aspects of the left elbow and visualized portion of the left forearm. This extends to involve the soft tissues along the posterior and medial aspects of the distal left humerus. A mild to moderate amount of soft tissue air is also seen within this region. Mild to moderate severity volar soft tissue swelling, edema and inflammatory fat stranding is seen at the level of the left elbow and visualized portion of the left forearm. IMPRESSION: 1. Post-traumatic changes involving the soft tissues of the left upper extremity, as described above, without an acute osseous abnormality. Electronically Signed   By: Virgina Norfolk M.D.   On: 04/12/2022 18:11   CT Head Wo Contrast  Result Date: 04/12/2022 CLINICAL DATA:  Status post fall. EXAM: CT HEAD WITHOUT CONTRAST TECHNIQUE: Contiguous axial images were obtained from the base of the skull through the vertex without intravenous contrast. RADIATION DOSE REDUCTION: This exam was performed according to the departmental dose-optimization program which includes automated exposure control, adjustment of the mA and/or kV according to patient size and/or use of iterative reconstruction technique. COMPARISON:  December 25, 2020 FINDINGS: Brain: There is mild cerebral atrophy with widening of the extra-axial spaces and ventricular dilatation. There are areas of decreased attenuation within the white matter tracts of the supratentorial brain, consistent with microvascular disease changes. Vascular: No hyperdense vessel or  unexpected calcification. Skull: Normal. Negative for fracture or focal lesion. Sinuses/Orbits: No acute finding. Other: None. IMPRESSION: 1. No acute intracranial abnormality. 2. Generalized cerebral atrophy with chronic white matter small vessel ischemic changes. Electronically Signed   By: Virgina Norfolk M.D.   On: 04/12/2022 17:58   DG Elbow Complete Left  Result Date: 04/12/2022 CLINICAL DATA:  Left elbow pain and swelling from fall. EXAM: LEFT ELBOW - COMPLETE 3+ VIEW COMPARISON:  Lost balance few days ago. Fell indication floor injuring left elbow. FINDINGS: Normal bone mineralization. There is elevation of the distal anterior humeral fat pad an elbow joint effusion. Mild distal coronoid process degenerative spurring. Mild peripheral medial elbow trochlear and coronoid process degenerative osteophytosis. Minimal chronic enthesopathic change at the common extensor tendon origin at the lateral epicondyle. Moderate  to high-grade distal posterior upper arm and elbow soft tissue swelling with low-density subcutaneous fat overlying likely edema. No definite acute fracture line is seen.  No dislocation. IMPRESSION: 1. Moderate to high-grade posterior distal upper arm and elbow soft tissue swelling and edema. 2. There is an elbow joint effusion. 3. No definite acute fracture line is seen. Given the history of recent trauma, it is difficult to entirely exclude a radiographically occult fracture. Recommend clinical correlation. Electronically Signed   By: Yvonne Kendall M.D.   On: 04/12/2022 15:43    Review of Systems  Constitutional:  Negative for chills, diaphoresis and fever.  HENT:  Negative for ear discharge, ear pain, hearing loss and tinnitus.   Eyes:  Negative for photophobia and pain.  Respiratory:  Negative for cough and shortness of breath.   Cardiovascular:  Negative for chest pain.  Gastrointestinal:  Negative for abdominal pain, nausea and vomiting.  Genitourinary:  Negative for dysuria,  flank pain, frequency and urgency.  Musculoskeletal:  Positive for arthralgias (Left elbow) and myalgias (Left FA). Negative for back pain and neck pain.  Neurological:  Negative for dizziness and headaches.  Hematological:  Does not bruise/bleed easily.  Psychiatric/Behavioral:  The patient is not nervous/anxious.    Blood pressure (!) 163/96, pulse 79, temperature 98 F (36.7 C), temperature source Oral, resp. rate (!) 21, height '6\' 3"'$  (1.905 m), weight 92.6 kg, SpO2 90 %. Physical Exam Constitutional:      General: He is not in acute distress.    Appearance: He is well-developed. He is not diaphoretic.  HENT:     Head: Normocephalic and atraumatic.  Eyes:     General: No scleral icterus.       Right eye: No discharge.        Left eye: No discharge.     Conjunctiva/sclera: Conjunctivae normal.  Cardiovascular:     Rate and Rhythm: Normal rate and regular rhythm.  Pulmonary:     Effort: Pulmonary effort is normal. No respiratory distress.  Musculoskeletal:     Cervical back: Normal range of motion.     Comments: Left shoulder, elbow, wrist, digits- Healed abrasion olecranon, various old abrasions and ecchymoses, mod TTP olecranon and boggy, mild TTP FA with erythema , no instability, no blocks to motion  Sens  Ax/R/M/U intact  Mot   Ax/ R/ PIN/ M/ AIN/ U intact  Rad 2+  Skin:    General: Skin is warm and dry.  Neurological:     Mental Status: He is alert.  Psychiatric:        Mood and Affect: Mood normal.        Behavior: Behavior normal.     Assessment/Plan: Left olecranon bursitis -- Could be traumatic but given DM and concurrent cellulitis will perform operative I&D. Please keep NPO.  R/B/A DISCUSSED WITH PT IN HOSPITAL.  PT VOICED UNDERSTANDING OF PLAN CONSENT SIGNED DAY OF SURGERY PT SEEN AND EXAMINED PRIOR TO OPERATIVE PROCEDURE/DAY OF SURGERY SITE MARKED. QUESTIONS ANSWERED WILL REMAIN AN INPATIENT FOLLOWING SURGERY   WE ARE PLANNING SURGERY FOR YOUR UPPER  EXTREMITY. THE RISKS AND BENEFITS OF SURGERY INCLUDE BUT NOT LIMITED TO BLEEDING INFECTION, DAMAGE TO NEARBY NERVES ARTERIES TENDONS, FAILURE OF SURGERY TO ACCOMPLISH ITS INTENDED GOALS, PERSISTENT SYMPTOMS AND NEED FOR FURTHER SURGICAL INTERVENTION. WITH THIS IN MIND WE WILL PROCEED. I HAVE DISCUSSED WITH THE PATIENT THE PRE AND POSTOPERATIVE REGIMEN AND THE DOS AND DON'TS. PT VOICED UNDERSTANDING AND INFORMED CONSENT SIGNED.   Leeam Cedrone [facility]  MD  Lisette Abu, PA-C Orthopedic Surgery [phone] 04/13/2022, 3:21 PM

## 2022-04-13 NOTE — Assessment & Plan Note (Addendum)
Patient is on metformin and Ozempic as an outpatient; both held on admission. Most recent hemoglobin A1C of 6.3%. Started on SSI on admission. Resume home diabetes meds outpatient

## 2022-04-13 NOTE — Assessment & Plan Note (Addendum)
Patient is on Lipitor as an outpatient. -Continue Lipitor

## 2022-04-13 NOTE — Assessment & Plan Note (Addendum)
Continue Synthroid °

## 2022-04-13 NOTE — Assessment & Plan Note (Signed)
Patient is on Omeprazole as an outpatient. -Protonix while inpatient

## 2022-04-13 NOTE — Assessment & Plan Note (Addendum)
-  Continue Toprol XL -lasix prn at home

## 2022-04-13 NOTE — Assessment & Plan Note (Signed)
Patient is on Rapaflo and Vesicare as an outpatient.

## 2022-04-14 ENCOUNTER — Encounter (HOSPITAL_COMMUNITY): Payer: Self-pay | Admitting: Orthopedic Surgery

## 2022-04-14 DIAGNOSIS — M71122 Other infective bursitis, left elbow: Secondary | ICD-10-CM | POA: Diagnosis not present

## 2022-04-14 LAB — CBC
HCT: 35.9 % — ABNORMAL LOW (ref 39.0–52.0)
Hemoglobin: 11.5 g/dL — ABNORMAL LOW (ref 13.0–17.0)
MCH: 26.7 pg (ref 26.0–34.0)
MCHC: 32 g/dL (ref 30.0–36.0)
MCV: 83.3 fL (ref 80.0–100.0)
Platelets: 204 10*3/uL (ref 150–400)
RBC: 4.31 MIL/uL (ref 4.22–5.81)
RDW: 14.1 % (ref 11.5–15.5)
WBC: 10.4 10*3/uL (ref 4.0–10.5)
nRBC: 0 % (ref 0.0–0.2)

## 2022-04-14 LAB — GLUCOSE, CAPILLARY
Glucose-Capillary: 132 mg/dL — ABNORMAL HIGH (ref 70–99)
Glucose-Capillary: 143 mg/dL — ABNORMAL HIGH (ref 70–99)
Glucose-Capillary: 125 mg/dL — ABNORMAL HIGH (ref 70–99)
Glucose-Capillary: 232 mg/dL — ABNORMAL HIGH (ref 70–99)
Glucose-Capillary: 117 mg/dL — ABNORMAL HIGH (ref 70–99)

## 2022-04-14 LAB — BASIC METABOLIC PANEL
Anion gap: 9 (ref 5–15)
BUN: 8 mg/dL (ref 8–23)
CO2: 23 mmol/L (ref 22–32)
Calcium: 8.9 mg/dL (ref 8.9–10.3)
Chloride: 103 mmol/L (ref 98–111)
Creatinine, Ser: 0.89 mg/dL (ref 0.61–1.24)
GFR, Estimated: >60 mL/min (ref >60)
Glucose, Bld: 122 mg/dL — ABNORMAL HIGH (ref 70–99)
Potassium: 4.5 mmol/L (ref 3.5–5.1)
Sodium: 135 mmol/L (ref 135–145)

## 2022-04-14 MED ORDER — OXYCODONE-ACETAMINOPHEN 5-325 MG PO TABS
1.0000 | ORAL_TABLET | ORAL | Status: DC | PRN
  Administered 2022-04-14 – 2022-04-16 (×6): 1 via ORAL
  Filled 2022-04-14 (×6): qty 1

## 2022-04-14 MED ORDER — BREXPIPRAZOLE 2 MG PO TABS
2.0000 mg | ORAL_TABLET | Freq: Every day | ORAL | Status: DC
  Administered 2022-04-14 – 2022-04-16 (×3): 2 mg via ORAL
  Filled 2022-04-14 (×5): qty 1

## 2022-04-14 MED ORDER — TAMSULOSIN HCL 0.4 MG PO CAPS
0.4000 mg | ORAL_CAPSULE | Freq: Every day | ORAL | Status: DC
  Administered 2022-04-14 – 2022-04-15 (×2): 0.4 mg via ORAL
  Filled 2022-04-14 (×2): qty 1

## 2022-04-14 MED ORDER — TRAMADOL HCL 50 MG PO TABS
25.0000 mg | ORAL_TABLET | ORAL | Status: DC | PRN
  Administered 2022-04-14 – 2022-04-16 (×3): 25 mg via ORAL
  Filled 2022-04-14 (×3): qty 1

## 2022-04-14 MED ORDER — OXYCODONE-ACETAMINOPHEN 10-325 MG PO TABS
0.5000 | ORAL_TABLET | ORAL | Status: DC | PRN
Start: 2022-04-14 — End: 2022-04-14

## 2022-04-14 MED ORDER — PREGABALIN 100 MG PO CAPS: 100.0000 mg | ORAL_CAPSULE | Freq: Three times a day (TID) | ORAL | Status: DC

## 2022-04-14 MED ORDER — FERROUS SULFATE 325 (65 FE) MG PO TABS
325.0000 mg | ORAL_TABLET | Freq: Every day | ORAL | Status: DC
  Administered 2022-04-14 – 2022-04-16 (×3): 325 mg via ORAL
  Filled 2022-04-14 (×3): qty 1

## 2022-04-14 MED ORDER — ASPIRIN 81 MG PO TBEC
81.0000 mg | DELAYED_RELEASE_TABLET | Freq: Every day | ORAL | Status: DC
  Administered 2022-04-14 – 2022-04-16 (×3): 81 mg via ORAL
  Filled 2022-04-14 (×3): qty 1

## 2022-04-14 MED ORDER — FOLIC ACID 1 MG PO TABS
1.0000 mg | ORAL_TABLET | Freq: Every day | ORAL | Status: DC
  Administered 2022-04-14 – 2022-04-16 (×3): 1 mg via ORAL
  Filled 2022-04-14 (×3): qty 1

## 2022-04-14 MED ORDER — FESOTERODINE FUMARATE ER 4 MG PO TB24
4.0000 mg | ORAL_TABLET | Freq: Every day | ORAL | Status: DC
  Administered 2022-04-14 – 2022-04-16 (×3): 4 mg via ORAL
  Filled 2022-04-14 (×5): qty 1

## 2022-04-14 NOTE — Progress Notes (Signed)
Mobility Specialist Progress Note   04/14/22 1530  Mobility  Activity Ambulated with assistance in hallway  Level of Assistance Contact guard assist, steadying assist  Assistive Device Cane  Distance Ambulated (ft) 256 ft  Activity Response Tolerated well  $Mobility charge 1 Mobility   Pre Mobility: 87 HR, 102/64 BP During Mobility: 110 HR Post Mobility: 79 HR, 131/84 BP  Received in bed c/o of pain (7/10) in L elbow but agreeable. Slow gait during ambulation to manage pain but no incidents throughout. Left in chair w/ call bell in reach and needs met.  Holland Falling Mobility Specialist MS [facility] #:  [phone] Acute Rehab Office:  [phone]

## 2022-04-14 NOTE — Progress Notes (Signed)
Patient arrived to room from PACU at 2008 pm

## 2022-04-14 NOTE — Plan of Care (Signed)

## 2022-04-14 NOTE — Progress Notes (Signed)
PROGRESS NOTE    Antonio Oliver  ZOX:[id number] DOB: 1955/10/01 DOA: 04/12/2022 PCP: [name], MD  Chief Complaint  Patient presents with   Fall    Brief Narrative:  Antonio Oliver is Antonio Oliver 66 y.o. male with medical history significant for CHB s/p PPM, severe AS s/p bioprosthetic AVR 03/2017, ascending aortic aneurysm, RBBB, T2DM, HTN, HLD, BPH, migraines, depression/anxiety, chronic pain who is admitted with septic bursitis of the left elbow.    Assessment & Plan:   Principal Problem:   Septic bursitis of elbow Active Problems:   DM2 (diabetes mellitus, type 2) ([place])   Hypertension associated with diabetes ([place])   Complete heart block (HCC)   Anxiety and depression   Hyperlipidemia associated with type 2 diabetes mellitus (HCC)   GERD (gastroesophageal reflux disease)   Hypothyroidism   Chronic pain   Ascending aorta dilation (HCC)   BPH (benign prostatic hyperplasia)   History of aortic stenosis   Anxiety   S/P AVR (aortic valve replacement)   Presence of permanent cardiac pacemaker   Assessment and Plan: * Septic bursitis of elbow Developed secondary to recent fall prior to admission. No evidence of osteomyelitis on CT imaging. Empiric antibiotics held on admission. Mild fever without leukocytosis.  Blood cx x1 pending S/p R elbow olecranon bursectomy, right elbow incision and drainage of posterior elbow abscess region 8/8 8/8 cx with rare gram positive cocci in pairs - follow Ceftriaxone 2 g daily   DM2 (diabetes mellitus, type 2) ([place]) Patient is on metformin and Ozempic as an outpatient; both held on admission. Most recent hemoglobin A1C of 6.3%. Started on SSI on admission. -Continue SSI  Hypertension associated with diabetes ([place]) -Continue Toprol XL -Hydralazine PO prn -lasix prn at home, on hold  Anxiety and depression Patient is listed as taking Wellbutrin, Buspar, and Klonopin Also on cymbalta, rexulti  Complete heart block ([place]) History of  pacemaker placement.  Hyperlipidemia associated with type 2 diabetes mellitus ([place]) Patient is on Lipitor as an outpatient. -Continue Lipitor  GERD (gastroesophageal reflux disease) Patient is on Omeprazole as an outpatient. -Protonix while inpatient  Hypothyroidism -Continue Synthroid  Chronic pain Home percocet, tramadol prn lyrica cymbalta   History of aortic stenosis S/p bioprosthetic AVR.  BPH (benign prostatic hyperplasia) Patient is on Rapaflo and Vesicare as an outpatient.  Ascending aorta dilation (HCC) Stable. Diameter of 4.3 cm. Patient follows with cardiology.     DVT prophylaxis: SCD Code Status: DNR Family Communication: none at bedside Disposition:   Status is: Inpatient Remains inpatient appropriate because: pending    Consultants:  orthopedics  Procedures:  Right elbow olecranon bursectomy Right elbow incision and drainage of posterior elbow abscess region.  Antimicrobials:  Anti-infectives (From admission, onward)    Start     Dose/Rate Route Frequency Ordered Stop   04/14/22 0600  ceFAZolin (ANCEF) IVPB 2g/100 mL premix        2 g 200 mL/hr over 30 Minutes Intravenous On call to O.R. 04/13/22 1606 04/13/22 1737   04/13/22 1230  cefTRIAXone (ROCEPHIN) 2 g in sodium chloride 0.9 % 100 mL IVPB        2 g 200 mL/hr over 30 Minutes Intravenous Every 24 hours 04/13/22 1141 04/20/22 1229       Subjective: No complaints  Objective: Vitals:   04/14/22 0012 04/14/22 0338 04/14/22 0745 04/14/22 1322  BP: 118/80 (!) 128/94 138/77 (!) 137/90  Pulse:   83 72  Resp: 16 (!) '22 17 19  '$ Temp:  99 F (37.2 C) 98.8 F (37.1 C)  TempSrc:      SpO2: 99% 97% 97% 97%  Weight:      Height:        Intake/Output Summary (Last 24 hours) at 04/14/2022 1757 Last data filed at 04/14/2022 1500 Gross per 24 hour  Intake 1330 ml  Output 1615 ml  Net -285 ml   Filed Weights   04/12/22 1522  Weight: 92.6 kg    Examination:  General exam: Appears  calm and comfortable  Respiratory system: unlabored Cardiovascular system: RRR Gastrointestinal system: Abdomen is nondistended, soft and nontender Central nervous system: Alert and oriented. No focal neurological deficits. Extremities: LUE with intact dressing   Data Reviewed: I have personally reviewed following labs and imaging studies  CBC: Recent Labs  Lab 04/12/22 1804 04/13/22 0656 04/14/22 0217  WBC 9.9 9.1 10.4  NEUTROABS 6.7  --   --   HGB 11.7* 11.6* 11.5*  HCT 36.3* 35.5* 35.9*  MCV 82.9 81.8 83.3  PLT 200 225 025    Basic Metabolic Panel: Recent Labs  Lab 04/12/22 1804 04/13/22 0656 04/14/22 0217  NA 138 137 135  K 4.6 4.4 4.5  CL 100 103 103  CO2 '26 23 23  '$ GLUCOSE 102* 135* 122*  BUN '15 12 8  '$ CREATININE 1.11 0.90 0.89  CALCIUM 9.8 9.1 8.9    GFR: Estimated Creatinine Clearance: 98.9 mL/min (by C-G formula based on SCr of 0.89 mg/dL).  Liver Function Tests: No results for input(s): "AST", "ALT", "ALKPHOS", "BILITOT", "PROT", "ALBUMIN" in the last 168 hours.  CBG: Recent Labs  Lab 04/13/22 2242 04/14/22 0631 04/14/22 0743 04/14/22 1117 04/14/22 1606  GLUCAP 129* 132* 143* 125* 232*     Recent Results (from the past 240 hour(s))  Blood culture (routine x 2)     Status: None (Preliminary result)   Collection Time: 04/12/22  6:17 PM   Specimen: BLOOD  Result Value Ref Range Status   Specimen Description   Final    BLOOD BLOOD RIGHT FOREARM Performed at [facility], [address] [place], [place] [address]    Special Requests   Final    Blood Culture adequate volume BOTTLES DRAWN AEROBIC AND ANAEROBIC Performed at [facility], [address] [place], [place] [address]    Culture   Final    NO GROWTH 2 DAYS Performed at [facility], [place] [address] [place], [place] [address]    Report Status PENDING  Incomplete  Surgical PCR screen     Status: Abnormal   Collection Time: 04/13/22  11:56 AM   Specimen: Nasal Mucosa; Nasal Swab  Result Value Ref Range Status   MRSA, PCR POSITIVE (Venice Marcucci) NEGATIVE Final    Comment: RESULT CALLED TO, READ BACK BY AND VERIFIED WITH: RN JADE REIVES ON 04/13/22 @ 1815 BY DRT    Staphylococcus aureus POSITIVE (Caprice Mccaffrey) NEGATIVE Final    Comment: (NOTE) The Xpert SA Assay (FDA approved for NASAL specimens in patients 101 years of age and older), is one component of Carrissa Taitano comprehensive surveillance program. It is not intended to diagnose infection nor to guide or monitor treatment. Performed at [facility], [place] [address], [place], [place] [address]   Aerobic/Anaerobic Culture w Gram Stain (surgical/deep wound)     Status: None (Preliminary result)   Collection Time: 04/13/22  5:55 PM   Specimen: PATH Other; Tissue  Result Value Ref Range Status   Specimen Description FLUID  Final  Special Requests LEFT ELBOW SWAB  Final   Gram Stain   Final    RARE GRAM POSITIVE COCCI IN PAIRS RARE WBC PRESENT,BOTH PMN AND MONONUCLEAR    Culture   Final    TOO YOUNG TO READ Performed at [facility], [address] [place], [place] [address]    Report Status PENDING  Incomplete  Aerobic/Anaerobic Culture w Gram Stain (surgical/deep wound)     Status: None (Preliminary result)   Collection Time: 04/13/22  5:56 PM   Specimen: PATH Other; Body Fluid  Result Value Ref Range Status   Specimen Description TISSUE  Final   Special Requests LEFT ELBOW PT ON ANCEF  Final   Gram Stain   Final    FEW WBC PRESENT, PREDOMINANTLY MONONUCLEAR NO ORGANISMS SEEN    Culture   Final    NO GROWTH < 12 HOURS Performed at [facility], [address], [place], [place] [address]    Report Status PENDING  Incomplete         Radiology Studies: No results found.      Scheduled Meds:  aspirin EC  81 mg Oral Daily   atorvastatin  20 mg Oral q1800   brexpiprazole  2 mg Oral Daily   buPROPion  200 mg Oral BID   busPIRone  20 mg Oral BID    DULoxetine  60 mg Oral BID   ferrous sulfate  325 mg Oral Daily   fesoterodine  4 mg Oral Daily   finasteride  5 mg Oral Daily   folic acid  1 mg Oral Daily   insulin aspart  0-9 Units Subcutaneous Q4H   levothyroxine  125 mcg Oral Daily   metoprolol succinate  25 mg Oral BID   mupirocin ointment  1 Application Nasal BID   pantoprazole  40 mg Oral Daily   pregabalin  100 mg Oral TID   sodium chloride flush  3 mL Intravenous Q12H   tamsulosin  0.4 mg Oral QPC supper   Continuous Infusions:  cefTRIAXone (ROCEPHIN)  IV 2 g (04/14/22 1158)   lactated ringers Stopped (04/13/22 1808)     LOS: 1 day    Time spent: over 30 min    Fayrene Helper, MD [facility]   To contact the attending provider between 7A-7P or the covering provider during after hours 7P-7A, please log into the web site [email or web address] and access using universal [place] password for that web site. If you do not have the password, please call the hospital operator.  04/14/2022, 5:57 PM

## 2022-04-14 NOTE — Progress Notes (Signed)
Mobility Specialist Progress Note   04/14/22 1730  Mobility  Activity Transferred from bed to chair  Level of Assistance Minimal assist, patient does 75% or more  Assistive Device  (HHA)  Distance Ambulated (ft) 2 ft  Activity Response Tolerated well  $Mobility charge 1 Mobility   Family member requesting assistance to get pt in w/c from bed. VC on hand placement to get pt to EOB + Min HHA for stability on stand pivot turn to w/c. No faults during transfer, left w/ family in room prepped to wheel pt around unit.   Holland Falling Mobility Specialist MS [facility] #:  [phone] Acute Rehab Office:  [phone]

## 2022-04-15 DIAGNOSIS — I4510 Unspecified right bundle-branch block: Secondary | ICD-10-CM | POA: Insufficient documentation

## 2022-04-15 DIAGNOSIS — R000 Tachycardia, unspecified: Secondary | ICD-10-CM

## 2022-04-15 DIAGNOSIS — I951 Orthostatic hypotension: Secondary | ICD-10-CM

## 2022-04-15 DIAGNOSIS — M71122 Other infective bursitis, left elbow: Secondary | ICD-10-CM | POA: Diagnosis not present

## 2022-04-15 LAB — CBC WITH DIFFERENTIAL/PLATELET
Abs Immature Granulocytes: 0.04 10*3/uL (ref 0.00–0.07)
Basophils Absolute: 0.1 10*3/uL (ref 0.0–0.1)
Basophils Relative: 1 %
Eosinophils Absolute: 0.3 10*3/uL (ref 0.0–0.5)
Eosinophils Relative: 3 %
HCT: 33.1 % — ABNORMAL LOW (ref 39.0–52.0)
Hemoglobin: 10.7 g/dL — ABNORMAL LOW (ref 13.0–17.0)
Immature Granulocytes: 0 %
Lymphocytes Relative: 15 %
Lymphs Abs: 1.4 10*3/uL (ref 0.7–4.0)
MCH: 26.8 pg (ref 26.0–34.0)
MCHC: 32.3 g/dL (ref 30.0–36.0)
MCV: 83 fL (ref 80.0–100.0)
Monocytes Absolute: 0.9 10*3/uL (ref 0.1–1.0)
Monocytes Relative: 10 %
Neutro Abs: 6.4 10*3/uL (ref 1.7–7.7)
Neutrophils Relative %: 71 %
Platelets: 217 10*3/uL (ref 150–400)
RBC: 3.99 MIL/uL — ABNORMAL LOW (ref 4.22–5.81)
RDW: 14.1 % (ref 11.5–15.5)
WBC: 9.1 10*3/uL (ref 4.0–10.5)
nRBC: 0 % (ref 0.0–0.2)

## 2022-04-15 LAB — COMPREHENSIVE METABOLIC PANEL
ALT: 10 U/L (ref 0–44)
AST: 14 U/L — ABNORMAL LOW (ref 15–41)
Albumin: 2.7 g/dL — ABNORMAL LOW (ref 3.5–5.0)
Alkaline Phosphatase: 74 U/L (ref 38–126)
Anion gap: 9 (ref 5–15)
BUN: 7 mg/dL — ABNORMAL LOW (ref 8–23)
CO2: 23 mmol/L (ref 22–32)
Calcium: 8.6 mg/dL — ABNORMAL LOW (ref 8.9–10.3)
Chloride: 103 mmol/L (ref 98–111)
Creatinine, Ser: 0.91 mg/dL (ref 0.61–1.24)
GFR, Estimated: >60 mL/min (ref >60)
Glucose, Bld: 118 mg/dL — ABNORMAL HIGH (ref 70–99)
Potassium: 3.9 mmol/L (ref 3.5–5.1)
Sodium: 135 mmol/L (ref 135–145)
Total Bilirubin: 0.6 mg/dL (ref 0.3–1.2)
Total Protein: 5.7 g/dL — ABNORMAL LOW (ref 6.5–8.1)

## 2022-04-15 LAB — GLUCOSE, CAPILLARY
Glucose-Capillary: 133 mg/dL — ABNORMAL HIGH (ref 70–99)
Glucose-Capillary: 119 mg/dL — ABNORMAL HIGH (ref 70–99)
Glucose-Capillary: 151 mg/dL — ABNORMAL HIGH (ref 70–99)
Glucose-Capillary: 83 mg/dL (ref 70–99)
Glucose-Capillary: 152 mg/dL — ABNORMAL HIGH (ref 70–99)
Glucose-Capillary: 137 mg/dL — ABNORMAL HIGH (ref 70–99)

## 2022-04-15 LAB — PHOSPHORUS: Phosphorus: 3.9 mg/dL (ref 2.5–4.6)

## 2022-04-15 LAB — MAGNESIUM: Magnesium: 1.8 mg/dL (ref 1.7–2.4)

## 2022-04-15 MED ORDER — VANCOMYCIN HCL 1750 MG/350ML IV SOLN
1750.0000 mg | Freq: Once | INTRAVENOUS | Status: AC
  Administered 2022-04-15: 1750 mg via INTRAVENOUS
  Filled 2022-04-15: qty 350

## 2022-04-15 MED ORDER — VANCOMYCIN HCL 1250 MG/250ML IV SOLN
1250.0000 mg | Freq: Two times a day (BID) | INTRAVENOUS | Status: DC
  Administered 2022-04-15: 1250 mg via INTRAVENOUS
  Filled 2022-04-15: qty 250

## 2022-04-15 MED ORDER — MEDIHONEY WOUND/BURN DRESSING EX PSTE
1.0000 | PASTE | Freq: Every day | CUTANEOUS | Status: DC
  Administered 2022-04-15 – 2022-04-16 (×2): 1 via TOPICAL
  Filled 2022-04-15: qty 44

## 2022-04-15 MED ORDER — MAGNESIUM SULFATE 2 GM/50ML IV SOLN
2.0000 g | Freq: Once | INTRAVENOUS | Status: AC
  Administered 2022-04-15: 2 g via INTRAVENOUS
  Filled 2022-04-15: qty 50

## 2022-04-15 MED ORDER — SODIUM CHLORIDE 0.9 % IV BOLUS
1000.0000 mL | Freq: Once | INTRAVENOUS | Status: AC
Start: 2022-04-15 — End: 2022-04-15
  Administered 2022-04-15: 1000 mL via INTRAVENOUS

## 2022-04-15 MED ORDER — POTASSIUM CHLORIDE CRYS ER 20 MEQ PO TBCR
40.0000 meq | EXTENDED_RELEASE_TABLET | Freq: Once | ORAL | Status: AC
  Administered 2022-04-15: 40 meq via ORAL
  Filled 2022-04-15: qty 2

## 2022-04-15 NOTE — Progress Notes (Signed)
PROGRESS NOTE    Antonio Oliver  QMV:[id number] DOB: [birth year] DOA: 04/12/2022 PCP: [name], MD  Chief Complaint  Patient presents with   Fall    Brief Narrative:  Antonio Oliver is Antonio Oliver 66 y.o. male with medical history significant for CHB s/p PPM, severe AS s/p bioprosthetic AVR 03/2017, ascending aortic aneurysm, RBBB, T2DM, HTN, HLD, BPH, migraines, depression/anxiety, chronic pain who is admitted with septic bursitis of the left elbow.    Assessment & Plan:   Principal Problem:   Septic bursitis of elbow Active Problems:   DM2 (diabetes mellitus, type 2) ([place])   Hypertension associated with diabetes ([place])   Complete heart block (HCC)   Anxiety and depression   Tachycardia   Hyperlipidemia associated with type 2 diabetes mellitus (HCC)   Orthostasis   GERD (gastroesophageal reflux disease)   Hypothyroidism   Chronic pain   Ascending aorta dilation (HCC)   BPH (benign prostatic hyperplasia)   History of aortic stenosis   Anxiety   S/P AVR (aortic valve replacement)   Presence of permanent cardiac pacemaker   Assessment and Plan: * Septic bursitis of elbow Developed secondary to recent fall prior to admission. No evidence of osteomyelitis on CT imaging. Empiric antibiotics held on admission. Mild fever without leukocytosis.  Blood cx x1 pending S/p R elbow olecranon bursectomy, right elbow incision and drainage of posterior elbow abscess region 8/8 8/8 cx with rare gram positive cocci in pairs - staph aureus, pending succeptibilities Vancomycin per pharmacy  DM2 (diabetes mellitus, type 2) ([place]) Patient is on metformin and Ozempic as an outpatient; both held on admission. Most recent hemoglobin A1C of 6.3%. Started on SSI on admission. -Continue SSI  Hypertension associated with diabetes ([place]) -Continue Toprol XL -Hydralazine PO prn -lasix prn at home, on hold  Tachycardia Follow telemetry, EKG  Anxiety and depression Patient is listed as taking  Wellbutrin, Buspar, and Klonopin Also on cymbalta, rexulti  Complete heart block ([place]) History of pacemaker placement.  Orthostasis Follow orthostatics, bolus Noted while he was working with mobility tech  Hyperlipidemia associated with type 2 diabetes mellitus ([place]) Patient is on Lipitor as an outpatient. -Continue Lipitor  GERD (gastroesophageal reflux disease) Patient is on Omeprazole as an outpatient. -Protonix while inpatient  Hypothyroidism -Continue Synthroid  Chronic pain Home percocet, tramadol prn lyrica cymbalta   History of aortic stenosis S/p bioprosthetic AVR.  BPH (benign prostatic hyperplasia) Patient is on Rapaflo and Vesicare as an outpatient.  Ascending aorta dilation (HCC) Stable. Diameter of 4.3 cm. Patient follows with cardiology.     DVT prophylaxis: SCD Code Status: DNR Family Communication: none at bedside Disposition:   Status is: Inpatient Remains inpatient appropriate because: pending    Consultants:  orthopedics  Procedures:  Right elbow olecranon bursectomy Right elbow incision and drainage of posterior elbow abscess region.  Antimicrobials:  Anti-infectives (From admission, onward)    Start     Dose/Rate Route Frequency Ordered Stop   04/16/22 0000  vancomycin (VANCOREADY) IVPB 1250 mg/250 mL        1,250 mg 166.7 mL/hr over 90 Minutes Intravenous Every 12 hours 04/15/22 1158     04/15/22 1230  vancomycin (VANCOREADY) IVPB 1750 mg/350 mL        1,750 mg 175 mL/hr over 120 Minutes Intravenous  Once 04/15/22 1158 04/15/22 1611   04/14/22 0600  ceFAZolin (ANCEF) IVPB 2g/100 mL premix        2 g 200 mL/hr over 30 Minutes Intravenous On call  to O.R. 04/13/22 1606 04/13/22 1737   04/13/22 1230  cefTRIAXone (ROCEPHIN) 2 g in sodium chloride 0.9 % 100 mL IVPB        2 g 200 mL/hr over 30 Minutes Intravenous Every 24 hours 04/13/22 1141 04/20/22 1229       Subjective: Wife asking if he can go home - discussed need for  susceptibility data  Objective: Vitals:   04/14/22 0745 04/14/22 1322 04/15/22 0553 04/15/22 0851  BP: 138/77 (!) 137/90 (!) 126/92 130/79  Pulse: 83 72 85   Resp: '17 19 19 18  '$ Temp: 99 F (37.2 C) 98.8 F (37.1 C)  98.4 F (36.9 C)  TempSrc:    Oral  SpO2: 97% 97% 96% 95%  Weight:      Height:        Intake/Output Summary (Last 24 hours) at 04/15/2022 1832 Last data filed at 04/15/2022 0955 Gross per 24 hour  Intake 243 ml  Output 300 ml  Net -57 ml   Filed Weights   04/12/22 1522  Weight: 92.6 kg    Examination:  General: No acute distress. Cardiovascular: RRR Lungs: unlabored Abdomen: Soft, nontender, nondistended  Neurological: moving all extremities Extremities: LUE with splint  Data Reviewed: I have personally reviewed following labs and imaging studies  CBC: Recent Labs  Lab 04/12/22 1804 04/13/22 0656 04/14/22 0217 04/15/22 0223  WBC 9.9 9.1 10.4 9.1  NEUTROABS 6.7  --   --  6.4  HGB 11.7* 11.6* 11.5* 10.7*  HCT 36.3* 35.5* 35.9* 33.1*  MCV 82.9 81.8 83.3 83.0  PLT 200 225 204 295    Basic Metabolic Panel: Recent Labs  Lab 04/12/22 1804 04/13/22 0656 04/14/22 0217 04/15/22 0223  NA 138 137 135 135  K 4.6 4.4 4.5 3.9  CL 100 103 103 103  CO2 '26 23 23 23  '$ GLUCOSE 102* 135* 122* 118*  BUN '15 12 8 '$ 7*  CREATININE 1.11 0.90 0.89 0.91  CALCIUM 9.8 9.1 8.9 8.6*  MG  --   --   --  1.8  PHOS  --   --   --  3.9    GFR: Estimated Creatinine Clearance: 96.7 mL/min (by C-G formula based on SCr of 0.91 mg/dL).  Liver Function Tests: Recent Labs  Lab 04/15/22 0223  AST 14*  ALT 10  ALKPHOS 74  BILITOT 0.6  PROT 5.7*  ALBUMIN 2.7*    CBG: Recent Labs  Lab 04/14/22 2133 04/15/22 0553 04/15/22 0829 04/15/22 1139 04/15/22 1643  GLUCAP 117* 133* 119* 151* 83     Recent Results (from the past 240 hour(s))  Blood culture (routine x 2)     Status: None (Preliminary result)   Collection Time: 04/12/22  6:17 PM   Specimen: BLOOD   Result Value Ref Range Status   Specimen Description   Final    BLOOD BLOOD RIGHT FOREARM Performed at [facility], [address] [place], [place] [address]    Special Requests   Final    Blood Culture adequate volume BOTTLES DRAWN AEROBIC AND ANAEROBIC Performed at [facility], [address], [place], [place] [address]    Culture   Final    NO GROWTH 3 DAYS Performed at [facility], [place] [address], [place], [place] [address]    Report Status PENDING  Incomplete  Surgical PCR screen     Status: Abnormal   Collection Time: 04/13/22 11:56 AM   Specimen: Nasal Mucosa; Nasal Swab  Result Value  Ref Range Status   MRSA, PCR POSITIVE (Siler Mavis) NEGATIVE Final    Comment: RESULT CALLED TO, READ BACK BY AND VERIFIED WITH: RN JADE REIVES ON 04/13/22 @ 1815 BY DRT    Staphylococcus aureus POSITIVE (Dalya Maselli) NEGATIVE Final    Comment: (NOTE) The Xpert SA Assay (FDA approved for NASAL specimens in patients 34 years of age and older), is one component of Kinta Martis comprehensive surveillance program. It is not intended to diagnose infection nor to guide or monitor treatment. Performed at [facility], [place] [address], [place], [place] [address]   Aerobic/Anaerobic Culture w Gram Stain (surgical/deep wound)     Status: None (Preliminary result)   Collection Time: 04/13/22  5:55 PM   Specimen: PATH Other; Tissue  Result Value Ref Range Status   Specimen Description FLUID  Final   Special Requests LEFT ELBOW SWAB  Final   Gram Stain   Final    RARE GRAM POSITIVE COCCI IN PAIRS RARE WBC PRESENT,BOTH PMN AND MONONUCLEAR Performed at [facility], [place] [address] [name], [place] [address]    Culture   Final    FEW STAPHYLOCOCCUS AUREUS SUSCEPTIBILITIES TO FOLLOW NO ANAEROBES ISOLATED; CULTURE IN PROGRESS FOR 5 DAYS    Report Status PENDING  Incomplete  Aerobic/Anaerobic Culture w Gram Stain (surgical/deep wound)     Status: None (Preliminary  result)   Collection Time: 04/13/22  5:56 PM   Specimen: PATH Other; Body Fluid  Result Value Ref Range Status   Specimen Description TISSUE  Final   Special Requests LEFT ELBOW PT ON ANCEF  Final   Gram Stain   Final    FEW WBC PRESENT, PREDOMINANTLY MONONUCLEAR NO ORGANISMS SEEN Performed at [facility], [address], [place], [place] [address]    Culture   Final    RARE STAPHYLOCOCCUS AUREUS CULTURE REINCUBATED FOR BETTER GROWTH NO ANAEROBES ISOLATED; CULTURE IN PROGRESS FOR 5 DAYS    Report Status PENDING  Incomplete         Radiology Studies: No results found.      Scheduled Meds:  aspirin EC  81 mg Oral Daily   atorvastatin  20 mg Oral q1800   brexpiprazole  2 mg Oral Daily   buPROPion  200 mg Oral BID   busPIRone  20 mg Oral BID   DULoxetine  60 mg Oral BID   ferrous sulfate  325 mg Oral Daily   fesoterodine  4 mg Oral Daily   finasteride  5 mg Oral Daily   folic acid  1 mg Oral Daily   insulin aspart  0-9 Units Subcutaneous Q4H   leptospermum manuka honey  1 Application Topical Daily   levothyroxine  125 mcg Oral Daily   metoprolol succinate  25 mg Oral BID   mupirocin ointment  1 Application Nasal BID   pantoprazole  40 mg Oral Daily   potassium chloride  40 mEq Oral Once   pregabalin  100 mg Oral TID   sodium chloride flush  3 mL Intravenous Q12H   tamsulosin  0.4 mg Oral QPC supper   Continuous Infusions:  cefTRIAXone (ROCEPHIN)  IV 2 g (04/15/22 1308)   lactated ringers Stopped (04/13/22 1808)   magnesium sulfate bolus IVPB     sodium chloride     [START ON 04/16/2022] vancomycin       LOS: 2 days    Time spent: over 30 min    Fayrene Helper, MD [facility]   To  contact the attending provider between 7A-7P or the covering provider during after hours 7P-7A, please log into the web site [email or web address] and access using universal [place] password for that web site. If you do not have the password, please call the  hospital operator.  04/15/2022, 6:32 PM

## 2022-04-15 NOTE — Plan of Care (Signed)

## 2022-04-15 NOTE — Progress Notes (Signed)
Pharmacy Antibiotic Note  BURNETT SPRAY is a 66 y.o. male admitted on 04/12/2022 following fall PTA with subsequent septic bursitis of elbow now s/p I&D and bursectomy of R elbow olecranon. Cultures from I&D in process showing staph aureus. Pharmacy has been consulted for vancomycin dosing.  Plan: Vancomycin 1750 mg IV x1, followed by vancomycin 1250 mg IV Q12h (eAUC 443, goal AUC 400-550, Scr 0.91, Vd 0.72)  Trend WBC, fever, renal function F/u cultures, clinical progress, levels as indicated De-escalate when able   Height: '6\' 3"'$  (190.5 cm) Weight: 92.6 kg (204 lb 2.3 oz) IBW/kg (Calculated) : 84.5  Temp (24hrs), Avg:98.6 F (37 C), Min:98.4 F (36.9 C), Max:98.8 F (37.1 C)  Recent Labs  Lab 04/12/22 1800 04/12/22 1804 04/13/22 0656 04/14/22 0217 04/15/22 0223  WBC  --  9.9 9.1 10.4 9.1  CREATININE  --  1.11 0.90 0.89 0.91  LATICACIDVEN 1.3  --   --   --   --     Estimated Creatinine Clearance: 96.7 mL/min (by C-G formula based on SCr of 0.91 mg/dL).    Allergies  Allergen Reactions   Calcium Carbonate Other (See Comments)    Pt had gastric bypass, can not absorb calcium carbonate - able to absorb calcium citrate   Ms Contin [Morphine] Itching    Can take with Benadryl, facial and nose itching     Antimicrobials this admission: Ceftriaxone 8/08 >> 8/09 Cefazolin 8/08 x1 (surgical ppx)   Thank you for allowing pharmacy to be a part of this patient's care.  Ardyth Harps, PharmD Clinical Pharmacist

## 2022-04-15 NOTE — Assessment & Plan Note (Addendum)
Seems improved, s/p bolus

## 2022-04-15 NOTE — Progress Notes (Signed)
Mobility Specialist Progress Note   04/15/22 1500  Mobility  Activity Moved into chair position in bed;Ambulated with assistance in hallway  Level of Assistance Minimal assist, patient does 75% or more  Assistive Device Cane  Distance Ambulated (ft) 22 ft  Activity Response Tolerated fair  $Mobility charge 1 Mobility   Pre Mobility: 84 HR During Mobility: 94 HR, 76/57 BP Post Mobility: 87 HR, 107/68 BP  Received pt in bed stating to be feeling well. Performed BUE exercises while in chair position, no complaints or incidents. Upon ambulating right outside pt's room, pt states of being dizzy and unsteady, Checked BP and vitals dropped moderately. MinA to get pt back in recliner and wheeled back in room. Call bell placed by side w/ RN notified about vitals and symptoms.  Holland Falling Mobility Specialist MS [facility] #:  [phone] Acute Rehab Office:  [phone]

## 2022-04-15 NOTE — Assessment & Plan Note (Addendum)
Intermittent change in morphology in wide complex rhythm -> discussed with cards, suspects intermittent pacing

## 2022-04-16 ENCOUNTER — Other Ambulatory Visit (HOSPITAL_COMMUNITY): Payer: Self-pay

## 2022-04-16 DIAGNOSIS — M7110 Other infective bursitis, unspecified site: Secondary | ICD-10-CM | POA: Diagnosis present

## 2022-04-16 DIAGNOSIS — M71122 Other infective bursitis, left elbow: Secondary | ICD-10-CM | POA: Diagnosis not present

## 2022-04-16 LAB — GLUCOSE, CAPILLARY
Glucose-Capillary: 124 mg/dL — ABNORMAL HIGH (ref 70–99)
Glucose-Capillary: 131 mg/dL — ABNORMAL HIGH (ref 70–99)
Glucose-Capillary: 125 mg/dL — ABNORMAL HIGH (ref 70–99)
Glucose-Capillary: 135 mg/dL — ABNORMAL HIGH (ref 70–99)

## 2022-04-16 LAB — CBC WITH DIFFERENTIAL/PLATELET
Abs Immature Granulocytes: 0.07 10*3/uL (ref 0.00–0.07)
Basophils Absolute: 0.1 10*3/uL (ref 0.0–0.1)
Basophils Relative: 1 %
Eosinophils Absolute: 0.5 10*3/uL (ref 0.0–0.5)
Eosinophils Relative: 6 %
HCT: 32 % — ABNORMAL LOW (ref 39.0–52.0)
Hemoglobin: 10.2 g/dL — ABNORMAL LOW (ref 13.0–17.0)
Immature Granulocytes: 1 %
Lymphocytes Relative: 21 %
Lymphs Abs: 1.6 10*3/uL (ref 0.7–4.0)
MCH: 26.8 pg (ref 26.0–34.0)
MCHC: 31.9 g/dL (ref 30.0–36.0)
MCV: 84 fL (ref 80.0–100.0)
Monocytes Absolute: 0.8 10*3/uL (ref 0.1–1.0)
Monocytes Relative: 10 %
Neutro Abs: 4.8 10*3/uL (ref 1.7–7.7)
Neutrophils Relative %: 61 %
Platelets: 243 10*3/uL (ref 150–400)
RBC: 3.81 MIL/uL — ABNORMAL LOW (ref 4.22–5.81)
RDW: 14.1 % (ref 11.5–15.5)
WBC: 7.8 10*3/uL (ref 4.0–10.5)
nRBC: 0 % (ref 0.0–0.2)

## 2022-04-16 LAB — COMPREHENSIVE METABOLIC PANEL
ALT: 11 U/L (ref 0–44)
AST: 12 U/L — ABNORMAL LOW (ref 15–41)
Albumin: 2.6 g/dL — ABNORMAL LOW (ref 3.5–5.0)
Alkaline Phosphatase: 65 U/L (ref 38–126)
Anion gap: 6 (ref 5–15)
BUN: 8 mg/dL (ref 8–23)
CO2: 22 mmol/L (ref 22–32)
Calcium: 8.3 mg/dL — ABNORMAL LOW (ref 8.9–10.3)
Chloride: 109 mmol/L (ref 98–111)
Creatinine, Ser: 0.85 mg/dL (ref 0.61–1.24)
GFR, Estimated: >60 mL/min (ref >60)
Glucose, Bld: 118 mg/dL — ABNORMAL HIGH (ref 70–99)
Potassium: 4.2 mmol/L (ref 3.5–5.1)
Sodium: 137 mmol/L (ref 135–145)
Total Bilirubin: 0.7 mg/dL (ref 0.3–1.2)
Total Protein: 5.3 g/dL — ABNORMAL LOW (ref 6.5–8.1)

## 2022-04-16 LAB — PHOSPHORUS: Phosphorus: 4.2 mg/dL (ref 2.5–4.6)

## 2022-04-16 LAB — MAGNESIUM: Magnesium: 2.2 mg/dL (ref 1.7–2.4)

## 2022-04-16 MED ORDER — DOXYCYCLINE HYCLATE 100 MG PO TABS
100.0000 mg | ORAL_TABLET | Freq: Two times a day (BID) | ORAL | 0 refills | Status: DC
  Filled 2022-04-16: qty 42, 21d supply, fill #0

## 2022-04-16 MED ORDER — DOXYCYCLINE HYCLATE 100 MG PO TABS
100.0000 mg | ORAL_TABLET | Freq: Two times a day (BID) | ORAL | Status: DC
  Administered 2022-04-16: 100 mg via ORAL
  Filled 2022-04-16: qty 1

## 2022-04-16 MED ORDER — DOXYCYCLINE HYCLATE 100 MG PO TABS: 100.0000 mg | ORAL_TABLET | Freq: Two times a day (BID) | ORAL | 0 refills | Status: AC

## 2022-04-16 NOTE — Progress Notes (Signed)
Pt is being discharged. Discharge instructions are provided to the patients and pt's wife. All questions answered.

## 2022-04-16 NOTE — Progress Notes (Signed)
  Transition of Care [facility]) Screening Note   Patient Details  Name: Antonio Oliver Date of Birth: 05-09-1956   Transition of Care [facility]) CM/SW Contact:    Bartholomew Crews, RN Phone Number: [phone] 04/16/2022, 8:57 AM    Transition of Care Department [facility]) has reviewed patient and no TOC needs have been identified at this time. We will continue to monitor patient advancement through interdisciplinary progression rounds. If new patient transition needs arise, please place a TOC consult.

## 2022-04-16 NOTE — Evaluation (Signed)
Physical Therapy Evaluation Patient Details Name: Antonio Oliver MRN: [id number] DOB: November 08, 1955 Today's Date: 04/16/2022  History of Present Illness  Patient is a 66 y/o male who presents on 8/7 with left elbow injury s/p fall. Found to have septic bursitis now s/p bursectomy and I&D elbow abscess 04/13/22. PMH includes AVR, HTN, glaucome, complete heart block s/p PPM, back surgeries/neck surgeries, AAA, DM, migraines, chronic pain.  Clinical Impression  Patient presents with pain, post surgical deficits LUE, impaired balance and impaired mobility s/p above. Pt lives at home with his wife and uses [facility] for ambulation PTA. Reports being independent for ADLs at baseline. Does endorse hx of falls due to poor balance. Today, pt tolerated transfers and ambulation with Min guard assist and use of SPC with 2 instances of LOB needing Min A for support. Pt reports feeling at functional baseline. Noted to have swelling in left hand- elevated it on pillows/towel. Will follow acutely to maximize independence and mobility prior to return home.       Recommendations for follow up therapy are one component of a multi-disciplinary discharge planning process, led by the attending physician.  Recommendations may be updated based on patient status, additional functional criteria and insurance authorization.  Follow Up Recommendations No PT follow up (declines HH services)      Assistance Recommended at Discharge Intermittent Supervision/Assistance  Patient can return home with the following  A little help with walking and/or transfers;A little help with bathing/dressing/bathroom;Assistance with cooking/housework;Assist for transportation    Equipment Recommendations None recommended by PT  Recommendations for Other Services       Functional Status Assessment Patient has had a recent decline in their functional status and demonstrates the ability to make significant improvements in function in a reasonable and  predictable amount of time.     Precautions / Restrictions Precautions Precautions: Fall Precaution Comments: hx of falls Restrictions Weight Bearing Restrictions: No      Mobility  Bed Mobility Overal bed mobility: Needs Assistance Bed Mobility: Supine to Sit     Supine to sit: Supervision, HOB elevated     General bed mobility comments: Increased time to get to EOB, use of rails.    Transfers Overall transfer level: Needs assistance Equipment used: Straight cane Transfers: Bed to chair/wheelchair/BSC, Sit to/from Stand Sit to Stand: Supervision   Step pivot transfers: Supervision       General transfer comment: Supervision for safety. Stood from [employer], transferred to chair post ambulation.    Ambulation/Gait Ambulation/Gait assistance: Min assist, Min guard Gait Distance (Feet): 150 Feet Assistive device: Straight cane Gait Pattern/deviations: Step-through pattern, Decreased stride length, Decreased step length - right, Decreased step length - left, Staggering left Gait velocity: decreased Gait velocity interpretation: <1.31 ft/sec, indicative of household ambulator   General Gait Details: Slow, mildly unsteady gait initially with LOB x2 in room reaching for second UE support to steady self; balance improved with increased distance. Reports this is baseline  Financial trader Rankin (Stroke Patients Only)       Balance Overall balance assessment: Mild deficits observed, not formally tested                                           Pertinent Vitals/Pain Pain Assessment Pain Assessment: Faces Faces Pain Scale: Hurts a  little bit Pain Location: LUE Pain Descriptors / Indicators: Operative site guarding, Sore Pain Intervention(s): Monitored during session, Repositioned    Home Living Family/patient expects to be discharged to:: Private residence Living Arrangements: Spouse/significant  other Available Help at Discharge: Family;Available PRN/intermittently Type of Home: House Home Access: Ramped entrance       Home Layout: Two level;Able to live on main level with bedroom/bathroom Home Equipment: Rolling Walker (2 wheels);Cane - single point;Wheelchair - manual Additional Comments: Wife works at [employer] as a Restaurant manager, fast food Prior Level of Function : Independent/Modified Independent             Mobility Comments: Uses SPC for ambulation, reports falls ADLs Comments: independent     Hand Dominance   Dominant Hand: Right    Extremity/Trunk Assessment   Upper Extremity Assessment Upper Extremity Assessment: Defer to OT evaluation;LUE deficits/detail LUE Deficits / Details: Swelling present in hand- worsened today per wife/patient    Lower Extremity Assessment Lower Extremity Assessment: Generalized weakness;LLE deficits/detail LLE Deficits / Details: Charcot foot       Communication   Communication: No difficulties  Cognition Arousal/Alertness: Awake/alert Behavior During Therapy: WFL for tasks assessed/performed Overall Cognitive Status: Within Functional Limits for tasks assessed                                          General Comments General comments (skin integrity, edema, etc.): Wife present during session. Left hand swollen- worse than yesterday per wife/patient. Elevated on towels/pillow.    Exercises     Assessment/Plan    PT Assessment Patient needs continued PT services  PT Problem List Decreased strength;Decreased mobility;Pain;Decreased balance       PT Treatment Interventions Therapeutic activities;Gait training;Balance training;Therapeutic exercise;Patient/family education;Functional mobility training    PT Goals (Current goals can be found in the Care Plan section)  Acute Rehab PT Goals Patient Stated Goal: to go home PT Goal Formulation: With patient Time For Goal Achievement:  04/30/22 Potential to Achieve Goals: Good    Frequency Min 3X/week     Co-evaluation               AM-PAC PT "6 Clicks" Mobility  Outcome Measure Help needed turning from your back to your side while in a flat bed without using bedrails?: A Little Help needed moving from lying on your back to sitting on the side of a flat bed without using bedrails?: A Little Help needed moving to and from a bed to a chair (including a wheelchair)?: A Little Help needed standing up from a chair using your arms (e.g., wheelchair or bedside chair)?: A Little Help needed to walk in hospital room?: A Little Help needed climbing 3-5 steps with a railing? : A Little 6 Click Score: 18    End of Session Equipment Utilized During Treatment: Gait belt Activity Tolerance: Patient tolerated treatment well Patient left: in chair;with call bell/phone within reach;with family/visitor present Nurse Communication: Mobility status PT Visit Diagnosis: Pain;Difficulty in walking, not elsewhere classified (R26.2);Unsteadiness on feet (R26.81) Pain - Right/Left: Left Pain - part of body: Arm    Time: 8657-8469 PT Time Calculation (min) (ACUTE ONLY): 19 min   Charges:   PT Evaluation $PT Eval Moderate Complexity: 1 Mod          Marisa Severin, PT, DPT Acute Rehabilitation Services Secure chat preferred Office [phone]  Marguarite Arbour A Bambie Pizzolato 04/16/2022, 3:34 PM

## 2022-04-16 NOTE — Discharge Summary (Signed)
Physician Discharge Summary  [name] IRW:[id number] DOB: 11-27-1955 DOA: 04/12/2022  PCP: [name], MD  Admit date: 04/12/2022 Discharge date: 04/16/2022  Time spent: 40 minutes  Recommendations for Outpatient Follow-up:  Follow outpatient CBC/CMP  Follow with orthopedics outpatient, Dr. Caralyn Guile Continue abx course   Discharge Diagnoses:  Principal Problem:   Septic bursitis of elbow Active Problems:   DM2 (diabetes mellitus, type 2) ([place])   Hypertension associated with diabetes ([place])   Complete heart block ([place])   Anxiety and depression   Right bundle branch block   Hyperlipidemia associated with type 2 diabetes mellitus (HCC)   Orthostasis   GERD (gastroesophageal reflux disease)   Hypothyroidism   Chronic pain   Ascending aorta dilation (HCC)   BPH (benign prostatic hyperplasia)   History of aortic stenosis   Anxiety   S/P AVR (aortic valve replacement)   Presence of permanent cardiac pacemaker   Septic bursitis   Discharge Condition: stable  Diet recommendation: heart healthy, diabetic  Filed Weights   04/12/22 1522  Weight: 92.6 kg    History of present illness:  Antonio Oliver is Antonio Oliver 66 y.o. male with medical history significant for CHB s/p PPM, severe AS s/p bioprosthetic AVR 03/2017, ascending aortic aneurysm, RBBB, T2DM, HTN, HLD, BPH, migraines, depression/anxiety, chronic pain who is admitted with septic bursitis of the left elbow.  Cultures grew MRSA, sensitive to tetracycline.  Discharging with 21 days doxycycline.  Needs to follow up with Dr. Caralyn Guile outpatient.  See below for additional details  Hospital Course:  Assessment and Plan: * Septic bursitis of elbow Developed secondary to recent fall prior to admission. No evidence of osteomyelitis on CT imaging. Empiric antibiotics held on admission. Mild fever without leukocytosis CT L elbow with post traumatic changes involving the soft tissues of the LU extremity.  Blood cx x1 NG x4 S/p  R elbow olecranon bursectomy, right elbow incision and drainage of posterior elbow abscess region 8/8 8/8 cx with rare gram positive cocci in pairs - MR staph aureus, sensitive to tetracycline.  Will discharge on doxycycline. Needs orthopedic follow up  DM2 (diabetes mellitus, type 2) ([place]) Patient is on metformin and Ozempic as an outpatient; both held on admission. Most recent hemoglobin A1C of 6.3%. Started on SSI on admission. Resume home diabetes meds outpatient  Hypertension associated with diabetes ([place]) -Continue Toprol XL -lasix prn at home  Right bundle branch block Intermittent change in morphology in wide complex rhythm -> discussed with cards, suspects intermittent pacing  Anxiety and depression Patient is listed as taking Wellbutrin, Buspar, and Klonopin Also on cymbalta, rexulti  Complete heart block ([place]) History of pacemaker placement.  Orthostasis Seems improved, s/p bolus  Hyperlipidemia associated with type 2 diabetes mellitus ([place]) Patient is on Lipitor as an outpatient. -Continue Lipitor  GERD (gastroesophageal reflux disease) Patient is on Omeprazole as an outpatient. -Protonix while inpatient  Hypothyroidism -Continue Synthroid  Chronic pain Home percocet, tramadol prn lyrica cymbalta   History of aortic stenosis S/p bioprosthetic AVR.  BPH (benign prostatic hyperplasia) Patient is on Rapaflo and Vesicare as an outpatient.  Ascending aorta dilation (HCC) Stable. Diameter of 4.3 cm on 12/2021 CT scan. Patient follows with cardiology.       Procedures: Right elbow olecranon bursectomy Right elbow incision and drainage of posterior elbow abscess region.   Consultations: orthopedics  Discharge Exam: Vitals:   04/15/22 2140 04/16/22 0615  BP: (!) 130/96 (!) 130/90  Pulse:  77  Resp:  Temp:    SpO2:     No new complaints Discussed d/c plan with wife  General: No acute distress. Cardiovascular: RRR Lungs:  unlabored Abdomen: Soft, nontender, nondistended  Neurological: Alert and oriented 3. Moves all extremities 4 with equal strength. Cranial nerves II through XII grossly intact. Extremities: LUE in splint  Discharge Instructions   Discharge Instructions     Call MD for:  difficulty breathing, headache or visual disturbances   Complete by: As directed    Call MD for:  extreme fatigue   Complete by: As directed    Call MD for:  hives   Complete by: As directed    Call MD for:  persistant dizziness or light-headedness   Complete by: As directed    Call MD for:  persistant nausea and vomiting   Complete by: As directed    Call MD for:  redness, tenderness, or signs of infection (pain, swelling, redness, odor or green/yellow discharge around incision site)   Complete by: As directed    Call MD for:  severe uncontrolled pain   Complete by: As directed    Call MD for:  temperature >100.4   Complete by: As directed    Diet - low sodium heart healthy   Complete by: As directed    Discharge instructions   Complete by: As directed    You were seen for Praneel Haisley septic olecranon bursitis on your left elbow.   You've been treated surgically by orthopedics.  You're growing MRSA which is sensitive to tetracyclines.  We'll send you home with an extended course of doxycycline.    You should follow up with Dr. Caralyn Guile within 1 week.   Return for new, recurrent, or worsening symptoms.  Please ask your PCP to request records from this hospitalization so they know what was done and what the next steps will be.   Discharge wound care:   Complete by: As directed    Follow with Dr. Caralyn Guile   Increase activity slowly   Complete by: As directed       Allergies as of 04/16/2022       Reactions   Calcium Carbonate Other (See Comments)   Pt had gastric bypass, can not absorb calcium carbonate - able to absorb calcium citrate   Ms Contin [morphine] Itching   Can take with Benadryl, facial and nose  itching        Medication List     TAKE these medications    aspirin EC 81 MG tablet Take 81 mg by mouth daily.   atorvastatin 20 MG tablet Commonly known as: LIPITOR Take 20 mg by mouth daily at 6 PM.   brexpiprazole 2 MG Tabs tablet Commonly known as: REXULTI Take 2 mg by mouth daily.   buPROPion 200 MG 12 hr tablet Commonly known as: WELLBUTRIN SR Take 200 mg by mouth 2 (two) times daily.   busPIRone 10 MG tablet Commonly known as: BUSPAR Take 20 mg by mouth 2 (two) times daily.   CALCIUM CITRATE PO Take 500 mg by mouth 2 (two) times daily.   clonazePAM 0.5 MG tablet Commonly known as: KLONOPIN Take 0.5 mg by mouth 2 (two) times daily as needed for anxiety.   diclofenac Sodium 1 % Gel Commonly known as: VOLTAREN Apply 1 application topically 4 (four) times daily.   docusate sodium 100 MG capsule Commonly known as: COLACE Take 1 capsule (100 mg total) by mouth 2 (two) times daily.   doxycycline 100 MG tablet Commonly  known as: VIBRA-TABS Take 1 tablet (100 mg total) by mouth every 12 (twelve) hours for 21 days.   DULoxetine 60 MG capsule Commonly known as: CYMBALTA Take 60 mg by mouth 2 (two) times daily.   Emgality 120 MG/ML Soaj Generic drug: Galcanezumab-gnlm Inject 120 mg into the skin every 28 (twenty-eight) days.   ferrous sulfate 325 (65 FE) MG EC tablet Take 325 mg by mouth daily.   finasteride 5 MG tablet Commonly known as: PROSCAR Take 5 mg by mouth daily.   fluticasone 50 MCG/ACT nasal spray Commonly known as: FLONASE Place 2 sprays into the nose daily as needed for allergies or rhinitis.   folic acid 1 MG tablet Commonly known as: FOLVITE Take 1 mg by mouth daily.   furosemide 20 MG tablet Commonly known as: LASIX Take 1 tablet (20 mg total) by mouth as needed (for leg swelling).   hydrALAZINE 25 MG tablet Commonly known as: APRESOLINE Take 0.5 tablets (12.5 mg total) by mouth 2 (two) times daily as needed. What changed:  reasons to take this   levothyroxine 125 MCG tablet Commonly known as: SYNTHROID Take 125 mcg by mouth daily.   loratadine 10 MG tablet Commonly known as: CLARITIN Take 10 mg by mouth daily as needed for allergies.   MAGNESIUM PO Take 1 tablet by mouth daily.   meclizine 25 MG tablet Commonly known as: ANTIVERT Take 25 mg by mouth 3 (three) times daily as needed for dizziness.   metFORMIN 1000 MG tablet Commonly known as: GLUCOPHAGE Take 1,000 mg by mouth 2 (two) times daily with Raetta Agostinelli meal.   metoprolol succinate 50 MG 24 hr tablet Commonly known as: TOPROL-XL Take 25 mg by mouth 2 (two) times daily.   mirtazapine 30 MG tablet Commonly known as: REMERON Take 30 mg by mouth at bedtime.   multivitamin with minerals tablet Take 1 tablet by mouth daily.   PreserVision/Lutein Caps Take 1 capsule by mouth at bedtime.   mupirocin ointment 2 % Commonly known as: BACTROBAN Apply 1 application topically 3 (three) times daily as needed (infection).   naloxone 4 MG/0.1ML Liqd nasal spray kit Commonly known as: NARCAN Instill or spray into the nose if you are experiencing shortness of breath or signs of opioid overdose.   omeprazole 20 MG capsule Commonly known as: PRILOSEC Take 20 mg by mouth 2 (two) times daily before Aadyn Buchheit meal.   oxyCODONE-acetaminophen 10-325 MG tablet Commonly known as: PERCOCET Take 0.5 tablets by mouth See admin instructions. 1/2 tablet every 3 hours.   OZEMPIC (0.25 OR 0.5 MG/DOSE) [place] Inject 0.5 mg into the skin every Monday.   polyethylene glycol 17 g packet Commonly known as: MIRALAX / GLYCOLAX Take 17 g by mouth daily. What changed:  when to take this reasons to take this   potassium chloride SA 20 MEQ tablet Commonly known as: KLOR-CON M Take 20 mEq by mouth daily as needed (with lasix use).   pregabalin 100 MG capsule Commonly known as: LYRICA Take 100 mg by mouth 3 (three) times daily.   silodosin 8 MG Caps capsule Commonly known as:  RAPAFLO Take 8 mg by mouth daily.   solifenacin 10 MG tablet Commonly known as: VESICARE Take 10 mg by mouth at bedtime.   tiZANidine 4 MG tablet Commonly known as: ZANAFLEX Take 4 mg by mouth 2 (two) times daily.   traMADol 50 MG tablet Commonly known as: ULTRAM Take 25 mg by mouth See admin instructions. 25 mg every 3 hours  Ubrelvy 100 MG Tabs Generic drug: Ubrogepant Take 1 tablet by mouth as needed (May repeat in 2 hours.  Maximum 2 tablets in 24 hours.).   VITAMIN D-3 PO Take 1 capsule by mouth daily.               Discharge Care Instructions  (From admission, onward)           Start     Ordered   04/16/22 0000  Discharge wound care:       Comments: Follow with Dr. Caralyn Guile   04/16/22 1621           Allergies  Allergen Reactions   Calcium Carbonate Other (See Comments)    Pt had gastric bypass, can not absorb calcium carbonate - able to absorb calcium citrate   Ms Contin [Morphine] Itching    Can take with Benadryl, facial and nose itching     Follow-up Information     Iran Planas, MD Follow up.   Specialty: Orthopedic Surgery Why: follow up within 1 week Contact information: [address] [place] [address] [phone]         [name], MD Follow up.   Specialty: Family Medicine Contact information: [address] [place] [address] [phone]         Constance Haw, MD .   Specialty: Cardiology Contact information: [place] [address] [phone]         Jenean Lindau, MD .   Specialty: Cardiology Contact information: [place] [address] [phone]                  The results of significant diagnostics from this hospitalization (including imaging, microbiology, ancillary and laboratory) are listed below for reference.    Significant Diagnostic Studies: CT Cervical Spine Wo Contrast  Result Date: 04/12/2022 CLINICAL  DATA:  Status post fall. EXAM: CT CERVICAL SPINE WITHOUT CONTRAST TECHNIQUE: Multidetector CT imaging of the cervical spine was performed without intravenous contrast. Multiplanar CT image reconstructions were also generated. RADIATION DOSE REDUCTION: This exam was performed according to the departmental dose-optimization program which includes automated exposure control, adjustment of the mA and/or kV according to patient size and/or use of iterative reconstruction technique. COMPARISON:  November 15, 2018 FINDINGS: Alignment: Normal. Skull base and vertebrae: No acute fracture. Charne Mcbrien metallic density fusion plate and screws are seen along the anterior aspects of the C3, C4, C5 and C6 vertebral bodies. Soft tissues and spinal canal: No prevertebral fluid or swelling. No visible canal hematoma. Disc levels: Moderate severity endplate sclerosis is seen at the level of C2-C3. There is marked severity narrowing of the anterior atlantoaxial articulation. Anterior surgical fusion is seen from the level of C3 through C6. Arthrodesis of the C6-C7 level is seen. This is noted on the prior study. Bilateral marked severity multilevel facet joint hypertrophy is noted. Upper chest: Negative. Other: None. IMPRESSION: 1. Interval anterior surgical fusion from the level of C3 through C6 since the prior study. 2. Arthrodesis of the C6-C7 level. 3. No acute cervical spine fracture. Electronically Signed   By: Virgina Norfolk M.D.   On: 04/12/2022 18:18   CT Elbow Left Wo Contrast  Result Date: 04/12/2022 CLINICAL DATA:  Status post fall. EXAM: CT OF THE UPPER LEFT EXTREMITY WITHOUT CONTRAST TECHNIQUE: Multidetector CT imaging of the upper left extremity was performed according to the standard protocol. RADIATION DOSE REDUCTION: This exam was performed according to  the departmental dose-optimization program which includes automated exposure control, adjustment of the mA and/or kV according to patient size and/or use of iterative  reconstruction technique. COMPARISON:  None Available. FINDINGS: Bones/Joint/Cartilage There is no evidence of acute fracture, dislocation or focal bone lesion. Ligaments Suboptimally assessed by CT. Muscles and Tendons Limited in evaluation in the absence of intravenous contrast and otherwise unremarkable. Soft tissues Moderate severity soft tissue swelling, with marked severity edema and inflammatory fat stranding, is seen involving the dorsal aspects of the left elbow and visualized portion of the left forearm. This extends to involve the soft tissues along the posterior and medial aspects of the distal left humerus. Franshesca Chipman mild to moderate amount of soft tissue air is also seen within this region. Mild to moderate severity volar soft tissue swelling, edema and inflammatory fat stranding is seen at the level of the left elbow and visualized portion of the left forearm. IMPRESSION: 1. Post-traumatic changes involving the soft tissues of the left upper extremity, as described above, without an acute osseous abnormality. Electronically Signed   By: Virgina Norfolk M.D.   On: 04/12/2022 18:11   CT Head Wo Contrast  Result Date: 04/12/2022 CLINICAL DATA:  Status post fall. EXAM: CT HEAD WITHOUT CONTRAST TECHNIQUE: Contiguous axial images were obtained from the base of the skull through the vertex without intravenous contrast. RADIATION DOSE REDUCTION: This exam was performed according to the departmental dose-optimization program which includes automated exposure control, adjustment of the mA and/or kV according to patient size and/or use of iterative reconstruction technique. COMPARISON:  December 25, 2020 FINDINGS: Brain: There is mild cerebral atrophy with widening of the extra-axial spaces and ventricular dilatation. There are areas of decreased attenuation within the white matter tracts of the supratentorial brain, consistent with microvascular disease changes. Vascular: No hyperdense vessel or unexpected  calcification. Skull: Normal. Negative for fracture or focal lesion. Sinuses/Orbits: No acute finding. Other: None. IMPRESSION: 1. No acute intracranial abnormality. 2. Generalized cerebral atrophy with chronic white matter small vessel ischemic changes. Electronically Signed   By: Virgina Norfolk M.D.   On: 04/12/2022 17:58   DG Elbow Complete Left  Result Date: 04/12/2022 CLINICAL DATA:  Left elbow pain and swelling from fall. EXAM: LEFT ELBOW - COMPLETE 3+ VIEW COMPARISON:  Lost balance few days ago. Fell indication floor injuring left elbow. FINDINGS: Normal bone mineralization. There is elevation of the distal anterior humeral fat pad an elbow joint effusion. Mild distal coronoid process degenerative spurring. Mild peripheral medial elbow trochlear and coronoid process degenerative osteophytosis. Minimal chronic enthesopathic change at the common extensor tendon origin at the lateral epicondyle. Moderate to high-grade distal posterior upper arm and elbow soft tissue swelling with low-density subcutaneous fat overlying likely edema. No definite acute fracture line is seen.  No dislocation. IMPRESSION: 1. Moderate to high-grade posterior distal upper arm and elbow soft tissue swelling and edema. 2. There is an elbow joint effusion. 3. No definite acute fracture line is seen. Given the history of recent trauma, it is difficult to entirely exclude Reginna Sermeno radiographically occult fracture. Recommend clinical correlation. Electronically Signed   By: Yvonne Kendall M.D.   On: 04/12/2022 15:43    Microbiology: Recent Results (from the past 240 hour(s))  Blood culture (routine x 2)     Status: None (Preliminary result)   Collection Time: 04/12/22  6:17 PM   Specimen: BLOOD  Result Value Ref Range Status   Specimen Description   Final    BLOOD BLOOD RIGHT FOREARM Performed  at [facility], [address] [place], [place] [address]    Special Requests   Final    Blood Culture adequate  volume BOTTLES DRAWN AEROBIC AND ANAEROBIC Performed at [facility], [address] [place], [place] [address]    Culture   Final    NO GROWTH 4 DAYS Performed at [facility], [place] [address] [place], [place] [address]    Report Status PENDING  Incomplete  Surgical PCR screen     Status: Abnormal   Collection Time: 04/13/22 11:56 AM   Specimen: Nasal Mucosa; Nasal Swab  Result Value Ref Range Status   MRSA, PCR POSITIVE (Kass Herberger) NEGATIVE Final    Comment: RESULT CALLED TO, READ BACK BY AND VERIFIED WITH: RN JADE REIVES ON 04/13/22 @ 1815 BY DRT    Staphylococcus aureus POSITIVE (Narcisa Ganesh) NEGATIVE Final    Comment: (NOTE) The Xpert SA Assay (FDA approved for NASAL specimens in patients 8 years of age and older), is one component of Zeffie Bickert comprehensive surveillance program. It is not intended to diagnose infection nor to guide or monitor treatment. Performed at [facility], [place] [address], [place], [place] [address]   Aerobic/Anaerobic Culture w Gram Stain (surgical/deep wound)     Status: None (Preliminary result)   Collection Time: 04/13/22  5:55 PM   Specimen: PATH Other; Tissue  Result Value Ref Range Status   Specimen Description FLUID  Final   Special Requests LEFT ELBOW SWAB  Final   Gram Stain   Final    RARE GRAM POSITIVE COCCI IN PAIRS RARE WBC PRESENT,BOTH PMN AND MONONUCLEAR Performed at [facility], [place] [address], [place], [place] [address]    Culture   Final    FEW METHICILLIN RESISTANT STAPHYLOCOCCUS AUREUS NO ANAEROBES ISOLATED; CULTURE IN PROGRESS FOR 5 DAYS    Report Status PENDING  Incomplete   Organism ID, Bacteria METHICILLIN RESISTANT STAPHYLOCOCCUS AUREUS  Final      Susceptibility   Methicillin resistant staphylococcus aureus - MIC*    CIPROFLOXACIN >=8 RESISTANT Resistant     ERYTHROMYCIN >=8 RESISTANT Resistant     GENTAMICIN <=0.5 SENSITIVE Sensitive     OXACILLIN >=4 RESISTANT Resistant     TETRACYCLINE <=1  SENSITIVE Sensitive     VANCOMYCIN <=0.5 SENSITIVE Sensitive     TRIMETH/SULFA 160 RESISTANT Resistant     CLINDAMYCIN <=0.25 SENSITIVE Sensitive     RIFAMPIN <=0.5 SENSITIVE Sensitive     Inducible Clindamycin NEGATIVE Sensitive     * FEW METHICILLIN RESISTANT STAPHYLOCOCCUS AUREUS  Aerobic/Anaerobic Culture w Gram Stain (surgical/deep wound)     Status: None (Preliminary result)   Collection Time: 04/13/22  5:56 PM   Specimen: PATH Other; Body Fluid  Result Value Ref Range Status   Specimen Description TISSUE  Final   Special Requests LEFT ELBOW PT ON ANCEF  Final   Gram Stain   Final    FEW WBC PRESENT, PREDOMINANTLY MONONUCLEAR NO ORGANISMS SEEN Performed at [facility], [address], [place], [place] [address]    Culture   Final    RARE STAPHYLOCOCCUS AUREUS SUSCEPTIBILITIES PERFORMED ON PREVIOUS CULTURE WITHIN THE LAST 5 DAYS. NO ANAEROBES ISOLATED; CULTURE IN PROGRESS FOR 5 DAYS    Report Status PENDING  Incomplete     Labs: Basic Metabolic Panel: Recent Labs  Lab 04/12/22 1804 04/13/22 0656 04/14/22 0217 04/15/22 0223 04/16/22 0140  NA 138 137 135 135 137  K 4.6 4.4 4.5 3.9  4.2  CL 100 103 103 103 109  CO2 '26 23 23 23 22  ' GLUCOSE 102* 135* 122* 118* 118*  BUN '15 12 8 ' 7* 8  CREATININE 1.11 0.90 0.89 0.91 0.85  CALCIUM 9.8 9.1 8.9 8.6* 8.3*  MG  --   --   --  1.8 2.2  PHOS  --   --   --  3.9 4.2   Liver Function Tests: Recent Labs  Lab 04/15/22 0223 04/16/22 0140  AST 14* 12*  ALT 10 11  ALKPHOS 74 65  BILITOT 0.6 0.7  PROT 5.7* 5.3*  ALBUMIN 2.7* 2.6*   No results for input(s): "LIPASE", "AMYLASE" in the last 168 hours. No results for input(s): "AMMONIA" in the last 168 hours. CBC: Recent Labs  Lab 04/12/22 1804 04/13/22 0656 04/14/22 0217 04/15/22 0223 04/16/22 0140  WBC 9.9 9.1 10.4 9.1 7.8  NEUTROABS 6.7  --   --  6.4 4.8  HGB 11.7* 11.6* 11.5* 10.7* 10.2*  HCT 36.3* 35.5* 35.9* 33.1* 32.0*  MCV 82.9 81.8 83.3 83.0 84.0  PLT  200 225 204 217 243   Cardiac Enzymes: No results for input(s): "CKTOTAL", "CKMB", "CKMBINDEX", "TROPONINI" in the last 168 hours. BNP: BNP (last 3 results) Recent Labs    03/31/22 1559  BNP 78.4    ProBNP (last 3 results) No results for input(s): "PROBNP" in the last 8760 hours.  CBG: Recent Labs  Lab 04/15/22 2323 04/16/22 0413 04/16/22 0852 04/16/22 1141 04/16/22 1549  GLUCAP 137* 124* 131* 125* 135*       Signed:  Fayrene Helper MD.  [facility] 04/16/2022, 6:15 PM

## 2022-04-16 NOTE — Care Management Important Message (Signed)
Important Message  Patient Details  Name: Antonio Oliver MRN: [id number] Date of Birth: Feb 02, 1956   Medicare Important Message Given:  Yes     Hannah Beat 04/16/2022, 2:40 PM

## 2022-04-16 NOTE — Progress Notes (Signed)
Mobility Specialist Progress Note  Cancelled: Pt prepping for d/c home and requesting/requiring no further assistance. Mobility Specialist no longer required.  Holland Falling Mobility Specialist MS [facility] #:  [phone] Acute Rehab Office:  [phone]

## 2022-04-17 LAB — CULTURE, BLOOD (ROUTINE X 2)
Culture: NO GROWTH
Special Requests: ADEQUATE

## 2022-04-18 LAB — AEROBIC/ANAEROBIC CULTURE W GRAM STAIN (SURGICAL/DEEP WOUND)

## 2022-04-19 ENCOUNTER — Ambulatory Visit: Payer: Medicare PPO

## 2022-04-19 ENCOUNTER — Encounter (HOSPITAL_COMMUNITY): Payer: Self-pay | Admitting: Orthopedic Surgery

## 2022-04-20 LAB — CUP PACEART REMOTE DEVICE CHECK
Battery Remaining Longevity: 77 mo
Battery Remaining Percentage: 59 %
Battery Voltage: 3.02 V
Brady Statistic AP VP Percent: 1 %
Brady Statistic AP VS Percent: 6.8 %
Brady Statistic AS VP Percent: 1 %
Brady Statistic AS VS Percent: 92 %
Brady Statistic RA Percent Paced: 6.7 %
Brady Statistic RV Percent Paced: 1 %
Date Time Interrogation Session: 20230814050201
Implantable Lead Implant Date: 20180710
Implantable Lead Implant Date: 20180710
Implantable Lead Location: 753859
Implantable Lead Location: 753860
Implantable Pulse Generator Implant Date: 20180710
Lead Channel Impedance Value: 460 Ohm
Lead Channel Impedance Value: 480 Ohm
Lead Channel Pacing Threshold Amplitude: 0.5 V
Lead Channel Pacing Threshold Amplitude: 1 V
Lead Channel Pacing Threshold Pulse Width: 0.4 ms
Lead Channel Pacing Threshold Pulse Width: 0.4 ms
Lead Channel Sensing Intrinsic Amplitude: 1.5 mV
Lead Channel Sensing Intrinsic Amplitude: 2.1 mV
Lead Channel Setting Pacing Amplitude: 1.25 V
Lead Channel Setting Pacing Amplitude: 2 V
Lead Channel Setting Pacing Pulse Width: 0.4 ms
Lead Channel Setting Sensing Sensitivity: 0.5 mV
Pulse Gen Model: 2272
Pulse Gen Serial Number: 8912213

## 2022-04-21 ENCOUNTER — Encounter: Payer: Self-pay | Admitting: Family Medicine

## 2022-04-21 NOTE — Progress Notes (Unsigned)
NEUROLOGY FOLLOW UP OFFICE NOTE  Antonio Oliver [id number]  Assessment/Plan:   Migraine without aura, without status migrainosus, not intractable Hypertension   1.  Migraine prevention:  Emgality monthly 2.  Migraine rescue:  Ubrelvy '100mg'$  3.  Limit use of pain relievers to no more than 2 days out of week to prevent risk of rebound or medication-overuse headache. 4.  Keep headache diary 5.  Blood pressure elevated.  His protocol by his other provider is to take a lisinopril which he will do Follow up one year  Subjective:  Antonio Oliver is a 66 year old male with complete heart block s/p PPM, possible rheumatoid arthritis, diabetes, depression/anxiety, chronic back pain and history of cervical myelopathy follows up for migraines   UPDATE: Intensity:  moderate Duration:  30 minutes to 3 hours Frequency:  1 every 6 weeks to 2 every 4 weeks. Due to payment issues, he has not been able to get his Emgality for 20 days.  He has had one migraine in past 20 days.   Rescue therapy:  Ubrelvy Current NSAIDS:  ASA '81mg'$  Current analgesics:  tramadol (daily, for generalized pain); oxycodone (for general pain) Current triptans: none Current ergotamine:  none Current anti-emetic:  none Current muscle relaxants:  tizanidine Current anti-anxiolytic:  BuSpar; clonazepam Current sleep aide:  none Current Antihypertensive medications:  Toprol-XL, Lasix Current Antidepressant medications:  Cymbalta '60mg'$  twice daily, Wellbutrin SR '200mg'$  twice daily, Remeron '60mg'$  Current Anticonvulsant medications:  gabapentin '600mg'$  TID Current anti-CGRP:  Emgality; Ubrelvy '100mg'$  Current Vitamins/Herbal/Supplements:  Iron, folic acid, MVI Current Antihistamines/Decongestants:  Sudafed Other therapy:  none Other medications:  Mirapex   Caffeine:  1 cup coffee daily.   Diet:  Does not drink water or soda.   Exercise:  no Depression:  yes; Anxiety:  Yes.  Major depressive disorder.  Needed to retire in 2002.    Other pain:  Arthralgias, chronic neck pain, Charcot foot Sleep hygiene:  poor   HISTORY:  Onset:  1980s Location:  Usually bifrontal Quality:  Squeezing, throbbing, pounding Initial Intensity:  7-8/10.  He denies new headache, thunderclap headache or severe headache that wakes from sleep. Aura:  none Premonitory Phase:  none Postdrome:  none Associated symptoms: Photophobia, phonophobia, blurred vision.  He denies associated nausea, vomiting, unilateral numbness or weakness. Initial Duration:  Usually a day.  Has happened up to 3 days. Initial Frequency:  10 to 15 days a month, started Botox in 2018 and improved once a month. Initial Frequency of abortive medication: 1 a month Triggers:  Unknown Relieving factors:  none Activity:  aggravates   CT head 11/14/18 personally reviewed and was normal. CT cervical spine 11/06/18 personally reviewed and demonstrated multilevel severe facet arthropathy with moderate bilateral neural foraminal stenosis at C3-4 and C4-5 as well as prior interbody fusion at C6-7 without stenosis.   Past NSAIDS:  ibuprofen Past analgesics:  Tylenol, Excedrin, Fioricet, Midrin Past abortive triptans:   Zomig '5mg'$ , Sumatriptan '6mg'$  [place] Past abortive ergotamine: none Past muscle relaxants:  Zanaflex Past anti-emetic:  none Past antihypertensive medications:  Verapamil, propranolol, clonidine Past antidepressant medications:  amitriptyline Past anticonvulsant medications:  topiramate '200mg'$  Past anti-CGRP:  Aimovig '140mg'$  Past vitamins/Herbal/Supplements:  none Past antihistamines/decongestants:  none Other past therapies:  Botox (effective but did not like getting all those injections every 3 months)   Family history of headache:  2 brothers had migraines.    PAST MEDICAL HISTORY: Past Medical History:  Diagnosis Date   Anxiety and depression  Ascending aorta dilation (HCC) 03/06/2019   4.3 cm 2022   Benign prostatic hyperplasia without lower urinary tract  symptoms 11/17/2016   Last Assessment & Plan:  Stable PSA today.  Formatting of this note might be different from the original. Last Assessment & Plan:  Stable PSA today. Last Assessment & Plan:  Formatting of this note might be different from the original. Stable PSA today.   BPH (benign prostatic hyperplasia)    Bradycardia 02/01/2019   Cervical myelopathy ([place]) 11/14/2018   Charcot's joint of foot 03/05/2019   Chronic back pain 04/03/2013   Chronic inflammatory arthritis 11/17/2016   History of positive rheumatoid factor in the past. Denies placement on immunosuppressive therapy   Chronic pain syndrome    Failed back surgical syndrome.  Chronic neck pain.  Chronic low back pain with sciatica bilateral   Colon cancer screening    2023 cologuard neg   Complete heart block (HCC)    Following AVR s/p [employer] PPM in 03/2017   DM2 (diabetes mellitus, type 2) ([place]) 04/03/2013   Last Assessment & Plan:  Formatting of this note might be different from the original. Diabetes is unchanged.  Continue current treatment regimen. Regular aerobic exercise. Diabetes will be reassessed in 3 months. Will check A1c today. Denies any problems with feet or sensation.   Elevated PSA 11/2021   Enthesopathy of ankle and tarsus 04/04/2013   Last Assessment & Plan:  Continues with swelling after prolonged standing.  Still  Wearing support hose, which helps.   Essential hypertension 10/30/2018   FHx: migraine headaches 04/04/2013   Fracture of capitate bone of wrist 02/26/2019   Fracture of tibia 03/05/2019   GERD (gastroesophageal reflux disease) 10/30/2018   Glaucoma 04/03/2013   Hemangioma of skin and subcutaneous tissue 07/07/2017   History of aortic valve replacement 2018   bicuspid AV   History of kidney stones 11/17/2016   History of sleep apnea    wt loss->gone   Hypothyroidism 10/30/2018   IDA (iron deficiency anemia) 11/2021   malabs d/t gastric bipass   Lesion of plantar nerve 04/04/2013    Lumbar stenosis with neurogenic claudication 01/08/2021   Major depressive disorder, recurrent ([place])    ECT in the remote past.  Patient disabled due to recurrent depression and chronic pain.   Migraine syndrome    Mixed dyslipidemia 10/30/2018   Morbid obesity ([place]) 10/27/2011   Nondisplaced fracture of medial cuneiform of left foot, initial encounter for closed fracture 11/16/2018   Osteoarthritis 10/30/2018   Personal history of tobacco use, presenting hazards to health 04/04/2013   Presence of permanent cardiac pacemaker    Severe single current episode of major depressive disorder, without psychotic features ([place]) 11/17/2016   Status post bariatric surgery 04/05/2011   bipass surg --presurg wt 310 lbs   Syncope 02/14/2019   Vitamin D deficiency 10/30/2018    MEDICATIONS: Current Outpatient Medications on File Prior to Visit  Medication Sig Dispense Refill   aspirin EC 81 MG tablet Take 81 mg by mouth daily.     atorvastatin (LIPITOR) 20 MG tablet Take 20 mg by mouth daily at 6 PM.      brexpiprazole (REXULTI) 2 MG TABS tablet Take 2 mg by mouth daily.      buPROPion (WELLBUTRIN SR) 200 MG 12 hr tablet Take 200 mg by mouth 2 (two) times daily.     busPIRone (BUSPAR) 10 MG tablet Take 20 mg by mouth 2 (two) times daily.  CALCIUM CITRATE PO Take 500 mg by mouth 2 (two) times daily.     Cholecalciferol (VITAMIN D-3 PO) Take 1 capsule by mouth daily.     clonazePAM (KLONOPIN) 0.5 MG tablet Take 0.5 mg by mouth 2 (two) times daily as needed for anxiety.     diclofenac Sodium (VOLTAREN) 1 % GEL Apply 1 application topically 4 (four) times daily.     docusate sodium (COLACE) 100 MG capsule Take 1 capsule (100 mg total) by mouth 2 (two) times daily. 10 capsule 0   doxycycline (VIBRA-TABS) 100 MG tablet Take 1 tablet (100 mg total) by mouth every 12 (twelve) hours for 21 days. 42 tablet 0   DULoxetine (CYMBALTA) 60 MG capsule Take 60 mg by mouth 2 (two) times daily.      ferrous  sulfate 325 (65 FE) MG EC tablet Take 325 mg by mouth daily.     finasteride (PROSCAR) 5 MG tablet Take 5 mg by mouth daily.     fluticasone (FLONASE) 50 MCG/ACT nasal spray Place 2 sprays into the nose daily as needed for allergies or rhinitis.     folic acid (FOLVITE) 1 MG tablet Take 1 mg by mouth daily.     furosemide (LASIX) 20 MG tablet Take 1 tablet (20 mg total) by mouth as needed (for leg swelling). 30 tablet 4   Galcanezumab-gnlm (EMGALITY) 120 MG/ML SOAJ Inject 120 mg into the skin every 28 (twenty-eight) days. 1 mL 4   hydrALAZINE (APRESOLINE) 25 MG tablet Take 0.5 tablets (12.5 mg total) by mouth 2 (two) times daily as needed. (Patient taking differently: Take 12.5 mg by mouth 2 (two) times daily as needed (diastolic BP > 90).) 90 tablet 3   levothyroxine (SYNTHROID) 125 MCG tablet Take 125 mcg by mouth daily.      loratadine (CLARITIN) 10 MG tablet Take 10 mg by mouth daily as needed for allergies.     MAGNESIUM PO Take 1 tablet by mouth daily.     meclizine (ANTIVERT) 25 MG tablet Take 25 mg by mouth 3 (three) times daily as needed for dizziness.     metFORMIN (GLUCOPHAGE) 1000 MG tablet Take 1,000 mg by mouth 2 (two) times daily with a meal.     metoprolol succinate (TOPROL-XL) 50 MG 24 hr tablet Take 25 mg by mouth 2 (two) times daily.     mirtazapine (REMERON) 30 MG tablet Take 30 mg by mouth at bedtime.      Multiple Vitamins-Minerals (MULTIVITAMIN WITH MINERALS) tablet Take 1 tablet by mouth daily.     Multiple Vitamins-Minerals (PRESERVISION/LUTEIN) CAPS Take 1 capsule by mouth at bedtime.     mupirocin ointment (BACTROBAN) 2 % Apply 1 application topically 3 (three) times daily as needed (infection).     naloxone (NARCAN) nasal spray 4 mg/0.1 mL Instill or spray into the nose if you are experiencing shortness of breath or signs of opioid overdose. 1 each 0   omeprazole (PRILOSEC) 20 MG capsule Take 20 mg by mouth 2 (two) times daily before a meal.      oxyCODONE-acetaminophen  (PERCOCET) 10-325 MG tablet Take 0.5 tablets by mouth See admin instructions. 1/2 tablet every 3 hours.     polyethylene glycol (MIRALAX / GLYCOLAX) 17 g packet Take 17 g by mouth daily. (Patient taking differently: Take 17 g by mouth daily as needed for mild constipation.) 14 each 0   potassium chloride SA (KLOR-CON M) 20 MEQ tablet Take 20 mEq by mouth daily as needed (with lasix  use).     pregabalin (LYRICA) 100 MG capsule Take 100 mg by mouth 3 (three) times daily.     Semaglutide (OZEMPIC, 0.25 OR 0.5 MG/DOSE, [place]) Inject 0.5 mg into the skin every Monday.     silodosin (RAPAFLO) 8 MG CAPS capsule Take 8 mg by mouth daily.     solifenacin (VESICARE) 10 MG tablet Take 10 mg by mouth at bedtime.     tiZANidine (ZANAFLEX) 4 MG tablet Take 4 mg by mouth 2 (two) times daily.     traMADol (ULTRAM) 50 MG tablet Take 25 mg by mouth See admin instructions. 25 mg every 3 hours     Ubrogepant (UBRELVY) 100 MG TABS Take 1 tablet by mouth as needed (May repeat in 2 hours.  Maximum 2 tablets in 24 hours.). 16 tablet 5   No current facility-administered medications on file prior to visit.    ALLERGIES: Allergies  Allergen Reactions   Calcium Carbonate Other (See Comments)    Pt had gastric bypass, can not absorb calcium carbonate - able to absorb calcium citrate   Ms Contin [Morphine] Itching    Can take with Benadryl, facial and nose itching     FAMILY HISTORY: Family History  Problem Relation Age of Onset   Cancer Mother        breast   Rheum arthritis Mother    Diabetes Mother    Heart disease Father    Kidney disease Father    Diabetes Father    Hypertension Brother    Diabetes Brother    Osteoarthritis Brother    Heart disease Brother    Hypertension Brother    Diabetes Brother    Rheum arthritis Brother    Depression Daughter    Anxiety disorder Daughter       Objective:  *** General: No acute distress.  Patient appears well-groomed.   Head:   Normocephalic/atraumatic Eyes:  Fundi examined but not visualized Neck: supple, no paraspinal tenderness, full range of motion Heart:  Regular rate and rhythm Neurological Exam: alert and oriented to person, place, and time.  Speech fluent and not dysarthric, language intact.  CN II-XII intact. Bulk and tone normal, muscle strength 5/5 throughout.  Sensation to light touch intact.  Deep tendon reflexes 1+ throughout.  Finger to nose testing intact.  Gait cautious and requiring assistance with a cane.  Romberg with mild sway.   Metta Clines, DO  CC: Cher Nakai, MD

## 2022-04-22 ENCOUNTER — Ambulatory Visit: Payer: Medicare PPO | Admitting: Neurology

## 2022-04-22 ENCOUNTER — Encounter: Payer: Self-pay | Admitting: Neurology

## 2022-04-22 VITALS — BP 162/103 | HR 65 | Ht 75.0 in | Wt 198.0 lb

## 2022-04-22 DIAGNOSIS — I10 Essential (primary) hypertension: Secondary | ICD-10-CM

## 2022-04-22 DIAGNOSIS — M25522 Pain in left elbow: Secondary | ICD-10-CM | POA: Diagnosis not present

## 2022-04-22 DIAGNOSIS — G43009 Migraine without aura, not intractable, without status migrainosus: Secondary | ICD-10-CM

## 2022-04-22 MED ORDER — UBRELVY 100 MG PO TABS: 1.0000 | ORAL_TABLET | ORAL | 11 refills | Status: DC | PRN

## 2022-04-22 MED ORDER — EMGALITY 120 MG/ML ~~LOC~~ SOAJ: 120.0000 mg | SUBCUTANEOUS | 11 refills | Status: DC

## 2022-04-23 ENCOUNTER — Ambulatory Visit: Payer: Medicare PPO | Admitting: Student

## 2022-04-23 ENCOUNTER — Encounter: Payer: Self-pay | Admitting: Student

## 2022-04-23 VITALS — BP 142/90 | HR 74 | Ht 75.0 in | Wt 197.8 lb

## 2022-04-23 DIAGNOSIS — I442 Atrioventricular block, complete: Secondary | ICD-10-CM | POA: Diagnosis not present

## 2022-04-23 DIAGNOSIS — I10 Essential (primary) hypertension: Secondary | ICD-10-CM

## 2022-04-23 DIAGNOSIS — Q231 Congenital insufficiency of aortic valve: Secondary | ICD-10-CM

## 2022-04-23 LAB — CUP PACEART INCLINIC DEVICE CHECK
Battery Remaining Longevity: 82 mo
Battery Voltage: 3.02 V
Brady Statistic RA Percent Paced: 6.5 %
Brady Statistic RV Percent Paced: 0.63 %
Date Time Interrogation Session: 20230818083956
Implantable Lead Implant Date: 20180710
Implantable Lead Implant Date: 20180710
Implantable Lead Location: 753859
Implantable Lead Location: 753860
Implantable Pulse Generator Implant Date: 20180710
Lead Channel Impedance Value: 462.5 Ohm
Lead Channel Impedance Value: 475 Ohm
Lead Channel Pacing Threshold Amplitude: 0.5 V
Lead Channel Pacing Threshold Amplitude: 0.5 V
Lead Channel Pacing Threshold Amplitude: 0.875 V
Lead Channel Pacing Threshold Pulse Width: 0.4 ms
Lead Channel Pacing Threshold Pulse Width: 0.4 ms
Lead Channel Pacing Threshold Pulse Width: 0.4 ms
Lead Channel Sensing Intrinsic Amplitude: 2.2 mV
Lead Channel Sensing Intrinsic Amplitude: 3.3 mV
Lead Channel Setting Pacing Amplitude: 1.125
Lead Channel Setting Pacing Amplitude: 2 V
Lead Channel Setting Pacing Pulse Width: 0.4 ms
Lead Channel Setting Sensing Sensitivity: 0.5 mV
Pulse Gen Model: 2272
Pulse Gen Serial Number: 8912213

## 2022-04-23 NOTE — Patient Instructions (Signed)
Medication Instructions:  Your physician recommends that you continue on your current medications as directed. Please refer to the Current Medication list given to you today.  *If you need a refill on your cardiac medications before your next appointment, please call your pharmacy*   Lab Work: None If you have labs (blood work) drawn today and your tests are completely normal, you will receive your results only by: MyChart Message (if you have MyChart) OR A paper copy in the mail If you have any lab test that is abnormal or we need to change your treatment, we will call you to review the results.   Follow-Up: At [facility], you and your health needs are our priority.  As part of our continuing mission to provide you with exceptional heart care, we have created designated Provider Care Teams.  These Care Teams include your primary Cardiologist (physician) and Advanced Practice Providers (APPs -  Physician Assistants and Nurse Practitioners) who all work together to provide you with the care you need, when you need it.   Your next appointment:   1 year(s)  The format for your next appointment:   In Person  Provider:   Will Camnitz, MD{   

## 2022-04-23 NOTE — Progress Notes (Signed)
Electrophysiology Office Note Date: 04/23/2022  ID:  Antonio Oliver, DOB 1956-07-11, MRN [id number]  PCP: Cher Nakai, MD Primary Cardiologist: Jenean Lindau, MD Electrophysiologist: Constance Haw, MD   CC: Pacemaker follow-up  Antonio Oliver is a 66 y.o. male seen today for [name], MD for post hospital follow up.    Pt admitted 8/7 - 04/16/2022 for septic bursitis of the elbow. Underwent elbow olecranon bursectomy, right elbow incision, and drainage of posterior elbow abscess.   Pt had intermittent wide complex rhythm -> likely intermittent pacing vs abberancy with rate right around 100  Since discharge from hospital the patient reports doing about the same. Caregiver states he is not very active, mostly sedentary. His BP runs high most of the time, but increasing his meds in the past has .  he denies chest pain, palpitations, dyspnea, PND, orthopnea, nausea, vomiting, dizziness, syncope, edema, weight gain, or early satiety.  Device History: St. Jude Dual Chamber PPM implanted 03/2017 for CHB  Past Medical History:  Diagnosis Date   Anxiety and depression    Ascending aorta dilation ([place]) 03/06/2019   4.3 cm 2022   Benign prostatic hyperplasia without lower urinary tract symptoms 11/17/2016   Last Assessment & Plan:  Stable PSA today.  Formatting of this note might be different from the original. Last Assessment & Plan:  Stable PSA today. Last Assessment & Plan:  Formatting of this note might be different from the original. Stable PSA today.   BPH (benign prostatic hyperplasia)    Bradycardia 02/01/2019   Cervical myelopathy ([place]) 11/14/2018   Charcot's joint of foot 03/05/2019   Chronic back pain 04/03/2013   Chronic inflammatory arthritis 11/17/2016   History of positive rheumatoid factor in the past. Denies placement on immunosuppressive therapy   Chronic pain syndrome    Failed back surgical syndrome.  Chronic neck pain.  Chronic low back pain with  sciatica bilateral   Colon cancer screening    2023 cologuard neg   Complete heart block (HCC)    Following AVR s/p St Jude PPM in 03/2017   DM2 (diabetes mellitus, type 2) ([place]) 04/03/2013   Last Assessment & Plan:  Formatting of this note might be different from the original. Diabetes is unchanged.  Continue current treatment regimen. Regular aerobic exercise. Diabetes will be reassessed in 3 months. Will check A1c today. Denies any problems with feet or sensation.   Elevated PSA 11/2021   Enthesopathy of ankle and tarsus 04/04/2013   Last Assessment & Plan:  Continues with swelling after prolonged standing.  Still  Wearing support hose, which helps.   Essential hypertension 10/30/2018   FHx: migraine headaches 04/04/2013   Fracture of capitate bone of wrist 02/26/2019   Fracture of tibia 03/05/2019   GERD (gastroesophageal reflux disease) 10/30/2018   Glaucoma 04/03/2013   Hemangioma of skin and subcutaneous tissue 07/07/2017   History of aortic valve replacement 2018   bicuspid AV   History of kidney stones 11/17/2016   History of sleep apnea    wt loss->gone   Hypothyroidism 10/30/2018   IDA (iron deficiency anemia) 11/2021   malabs d/t gastric bipass   Lesion of plantar nerve 04/04/2013   Lumbar stenosis with neurogenic claudication 01/08/2021   Major depressive disorder, recurrent ([place])    ECT in the remote past.  Patient disabled due to recurrent depression and chronic pain.   Migraine syndrome    Mixed dyslipidemia 10/30/2018   Morbid obesity ([place]) 10/27/2011  Nondisplaced fracture of medial cuneiform of left foot, initial encounter for closed fracture 11/16/2018   Osteoarthritis 10/30/2018   Personal history of tobacco use, presenting hazards to health 04/04/2013   Presence of permanent cardiac pacemaker    Severe single current episode of major depressive disorder, without psychotic features ([place]) 11/17/2016   Status post bariatric surgery 04/05/2011   bipass surg  --presurg wt 310 lbs   Syncope 02/14/2019   Vitamin D deficiency 10/30/2018   Past Surgical History:  Procedure Laterality Date   ANTERIOR CERVICAL DECOMP/DISCECTOMY FUSION N/A 11/17/2018   Procedure: CERVICAL THREE-CERVICAL FOUR, CERVICAL FOUR-CERVICAL FIVE, CERVICAL FIVE-CERVICAL SIX ANTERIOR CERVICAL DECOMPRESSION/DISCECTOMY FUSION;  Surgeon: Earnie Larsson, MD;  Location: [facility];  Service: Neurosurgery;  Laterality: N/A;   AORTIC VALVE REPLACEMENT  45/6256   APPLICATION OF WOUND VAC Left 04/05/2019   Procedure: Application Of Wound Vac;  Surgeon: Erle Crocker, MD;  Location: [facility];  Service: Orthopedics;  Laterality: Left;   BACK SURGERY     x4   CARDIOVASCULAR STRESS TEST  05/05/2021   lexiscan neg   FASCIOTOMY Left 04/05/2019   Left leg 2 compartment fasciotomy   FASCIOTOMY Left 04/05/2019   Procedure: FASCIOTOMY LEFT LOWER LEG;  Surgeon: Erle Crocker, MD;  Location: [facility];  Service: Orthopedics;  Laterality: Left;   FOOT NEUROMA SURGERY     GASTRIC BYPASS  2012   HEMATOMA EVACUATION Left 04/05/2019   Procedure: Evacuation Hematoma Left Lower Leg;  Surgeon: Erle Crocker, MD;  Location: [facility];  Service: Orthopedics;  Laterality: Left;   HEMORRHOID SURGERY     over 30 years ago   [place] umb   I & D EXTREMITY Left 04/13/2022   Procedure: IRRIGATION AND DEBRIDEMENT OF ELBOW AND BURSECTOMY;  Surgeon: Iran Planas, MD;  Location: [facility];  Service: Orthopedics;  Laterality: Left;   LUMBAR LAMINECTOMY/DECOMPRESSION MICRODISCECTOMY N/A 01/08/2021   Procedure: CENTRAL LUMBAR LAMINECTOMY LUMBAR TWO-THREE, LUMBAR THREE-FOUR;  Surgeon: Jessy Oto, MD;  Location: [facility];  Service: Orthopedics;  Laterality: N/A;   NASAL SINUS SURGERY     x2   NECK SURGERY     OLECRANON BURSECTOMY  2023   d/t septic bursitis   TRANSTHORACIC ECHOCARDIOGRAM  12/2020   EF normal, mod MR, ascending AA (4.4 cm)    Current Outpatient Medications  Medication Sig Dispense  Refill   aspirin EC 81 MG tablet Take 81 mg by mouth daily.     atorvastatin (LIPITOR) 20 MG tablet Take 20 mg by mouth daily at 6 PM.      brexpiprazole (REXULTI) 2 MG TABS tablet Take 2 mg by mouth daily.      buPROPion (WELLBUTRIN SR) 200 MG 12 hr tablet Take 200 mg by mouth 2 (two) times daily.     busPIRone (BUSPAR) 10 MG tablet Take 20 mg by mouth 2 (two) times daily.      CALCIUM CITRATE PO Take 500 mg by mouth 2 (two) times daily.     Cholecalciferol (VITAMIN D-3 PO) Take 1 capsule by mouth daily.     clonazePAM (KLONOPIN) 0.5 MG tablet Take 0.5 mg by mouth 2 (two) times daily as needed for anxiety.     diclofenac Sodium (VOLTAREN) 1 % GEL Apply 1 application topically 4 (four) times daily.     docusate sodium (COLACE) 100 MG capsule Take 1 capsule (100 mg total) by mouth 2 (two) times daily. 10 capsule 0   doxycycline (VIBRA-TABS) 100  MG tablet Take 1 tablet (100 mg total) by mouth every 12 (twelve) hours for 21 days. 42 tablet 0   DULoxetine (CYMBALTA) 60 MG capsule Take 60 mg by mouth 2 (two) times daily.      ferrous sulfate 325 (65 FE) MG EC tablet Take 325 mg by mouth daily.     finasteride (PROSCAR) 5 MG tablet Take 5 mg by mouth daily.     fluticasone (FLONASE) 50 MCG/ACT nasal spray Place 2 sprays into the nose daily as needed for allergies or rhinitis.     folic acid (FOLVITE) 1 MG tablet Take 1 mg by mouth daily.     furosemide (LASIX) 20 MG tablet Take 1 tablet (20 mg total) by mouth as needed (for leg swelling). 30 tablet 4   Galcanezumab-gnlm (EMGALITY) 120 MG/ML SOAJ Inject 120 mg into the skin every 28 (twenty-eight) days. 1 mL 11   hydrALAZINE (APRESOLINE) 25 MG tablet Take 0.5 tablets (12.5 mg total) by mouth 2 (two) times daily as needed. (Patient taking differently: Take 12.5 mg by mouth 2 (two) times daily as needed (diastolic BP > 90).) 90 tablet 3   levothyroxine (SYNTHROID) 125 MCG tablet Take 125 mcg by mouth daily.      loratadine (CLARITIN) 10 MG tablet Take 10  mg by mouth daily as needed for allergies.     MAGNESIUM PO Take 1 tablet by mouth daily.     meclizine (ANTIVERT) 25 MG tablet Take 25 mg by mouth 3 (three) times daily as needed for dizziness.     metFORMIN (GLUCOPHAGE) 1000 MG tablet Take 1,000 mg by mouth 2 (two) times daily with a meal.     metoprolol succinate (TOPROL-XL) 50 MG 24 hr tablet Take 25 mg by mouth 2 (two) times daily.     mirtazapine (REMERON) 30 MG tablet Take 30 mg by mouth at bedtime.      Multiple Vitamins-Minerals (MULTIVITAMIN WITH MINERALS) tablet Take 1 tablet by mouth daily.     Multiple Vitamins-Minerals (PRESERVISION/LUTEIN) CAPS Take 1 capsule by mouth at bedtime.     mupirocin ointment (BACTROBAN) 2 % Apply 1 application topically 3 (three) times daily as needed (infection).     naloxone (NARCAN) nasal spray 4 mg/0.1 mL Instill or spray into the nose if you are experiencing shortness of breath or signs of opioid overdose. 1 each 0   omeprazole (PRILOSEC) 20 MG capsule Take 20 mg by mouth 2 (two) times daily before a meal.      oxyCODONE-acetaminophen (PERCOCET) 10-325 MG tablet Take 0.5 tablets by mouth See admin instructions. 1/2 tablet every 3 hours.     polyethylene glycol (MIRALAX / GLYCOLAX) 17 g packet Take 17 g by mouth daily. (Patient taking differently: Take 17 g by mouth daily as needed for mild constipation.) 14 each 0   potassium chloride SA (KLOR-CON M) 20 MEQ tablet Take 20 mEq by mouth daily as needed (with lasix use).     pregabalin (LYRICA) 100 MG capsule Take 100 mg by mouth 3 (three) times daily.     Semaglutide (OZEMPIC, 0.25 OR 0.5 MG/DOSE, [place]) Inject 0.5 mg into the skin every Monday.     silodosin (RAPAFLO) 8 MG CAPS capsule Take 8 mg by mouth daily.     solifenacin (VESICARE) 10 MG tablet Take 10 mg by mouth at bedtime.     tiZANidine (ZANAFLEX) 4 MG tablet Take 4 mg by mouth 2 (two) times daily.     traMADol (ULTRAM) 50 MG tablet  Take 25 mg by mouth See admin instructions. 25 mg every 3  hours     Ubrogepant (UBRELVY) 100 MG TABS Take 1 tablet by mouth as needed (May repeat in 2 hours.  Maximum 2 tablets in 24 hours.). 16 tablet 11   No current facility-administered medications for this visit.    Allergies:   Calcium carbonate and Ms contin [morphine]   Social History: Social History   Socioeconomic History   Marital status: Married    Spouse name: susan    Number of children: 1   Years of education: Not on file   Highest education level: Bachelor's degree (e.g., BA, AB, BS)  Occupational History   Not on file  Tobacco Use   Smoking status: Never   Smokeless tobacco: Never  Vaping Use   Vaping Use: Never used  Substance and Sexual Activity   Alcohol use: Yes    Alcohol/week: 1.0 standard drink of alcohol    Types: 1 Glasses of wine per week    Comment: occ.   Drug use: No   Sexual activity: Not on file  Other Topics Concern   Not on file  Social History Narrative   Patient lives at home with spouse [name]   Educ: BS   Occup: retired Art therapist.  Disabled due to depression and pain.   Tobacco: None   Alc: none   Right handed   Social Determinants of Health   Financial Resource Strain: Not on file  Food Insecurity: Not on file  Transportation Needs: Not on file  Physical Activity: Not on file  Stress: Not on file  Social Connections: Not on file  Intimate Partner Violence: Not At Risk (02/15/2019)   Humiliation, Afraid, Rape, and Kick questionnaire    Fear of Current or Ex-Partner: No    Emotionally Abused: No    Physically Abused: No    Sexually Abused: No    Family History: Family History  Problem Relation Age of Onset   Cancer Mother        breast   Rheum arthritis Mother    Diabetes Mother    Heart disease Father    Kidney disease Father    Diabetes Father    Hypertension Brother    Diabetes Brother    Osteoarthritis Brother    Heart disease Brother    Hypertension Brother    Diabetes Brother    Rheum arthritis Brother     Depression Daughter    Anxiety disorder Daughter      Review of Systems: All other systems reviewed and are otherwise negative except as noted above.  Physical Exam: There were no vitals filed for this visit.   GEN- The patient is well appearing, alert and oriented x 3 today.   HEENT: normocephalic, atraumatic; sclera clear, conjunctiva pink; hearing intact; oropharynx clear; neck supple  Lungs- Clear to ausculation bilaterally, normal work of breathing.  No wheezes, rales, rhonchi Heart- Regular rate and rhythm, no murmurs, rubs or gallops  GI- soft, non-tender, non-distended, bowel sounds present  Extremities- no clubbing or cyanosis. No edema MS- no significant deformity or atrophy Skin- warm and dry, no rash or lesion; PPM pocket well healed Psych- euthymic mood, full affect Neuro- strength and sensation are intact  PPM Interrogation- reviewed in detail today,  See PACEART report  EKG:  EKG is not ordered today.  Recent Labs: 11/23/2021: TSH 1.42 03/31/2022: BNP 78.4 04/16/2022: ALT 11; BUN 8; Creatinine, Ser 0.85; Hemoglobin 10.2; Magnesium 2.2; Platelets 243;  Potassium 4.2; Sodium 137   Wt Readings from Last 3 Encounters:  04/22/22 198 lb (89.8 kg)  04/12/22 204 lb 2.3 oz (92.6 kg)  03/31/22 204 lb 3.2 oz (92.6 kg)     Other studies Reviewed: Additional studies/ records that were reviewed today include: Previous EP office notes, Previous remote checks, Most recent labwork.   Assessment and Plan:  1. CHB s/p St. Jude PPM  Normal PPM function See Pace Art report No changes today  2. Bicuspid aortic valve with severe stenosis s/p aortic valve replacement Followed by Dr. [name].  Echo 12/2021 LVEF 60-65% Stable valve.   3. HTN Labile. Encouraged appropriate hydration, compression hose, and orthostatic precautions when quickly changing position.   Current medicines are reviewed at length with the patient today.    Disposition:   Follow up with Dr. Curt Bears in  12 months    Signed, Shirley Friar, PA-C  04/23/2022 8:09 AM  [facility] [address] [place] [address] [phone] (office) [phone] (fax)

## 2022-04-28 ENCOUNTER — Encounter (HOSPITAL_COMMUNITY): Payer: Self-pay | Admitting: Orthopedic Surgery

## 2022-04-28 NOTE — Addendum Note (Signed)
Addendum  created 04/28/22 1353 by Albertha Ghee, MD   Intraprocedure Event edited, Intraprocedure Staff edited

## 2022-04-29 DIAGNOSIS — E559 Vitamin D deficiency, unspecified: Secondary | ICD-10-CM | POA: Diagnosis not present

## 2022-04-29 DIAGNOSIS — N3281 Overactive bladder: Secondary | ICD-10-CM | POA: Diagnosis not present

## 2022-04-29 DIAGNOSIS — I10 Essential (primary) hypertension: Secondary | ICD-10-CM | POA: Diagnosis not present

## 2022-04-29 DIAGNOSIS — E1121 Type 2 diabetes mellitus with diabetic nephropathy: Secondary | ICD-10-CM | POA: Diagnosis not present

## 2022-04-29 DIAGNOSIS — D509 Iron deficiency anemia, unspecified: Secondary | ICD-10-CM | POA: Diagnosis not present

## 2022-04-29 DIAGNOSIS — M159 Polyosteoarthritis, unspecified: Secondary | ICD-10-CM | POA: Diagnosis not present

## 2022-04-29 DIAGNOSIS — D519 Vitamin B12 deficiency anemia, unspecified: Secondary | ICD-10-CM | POA: Diagnosis not present

## 2022-04-29 DIAGNOSIS — Z794 Long term (current) use of insulin: Secondary | ICD-10-CM | POA: Diagnosis not present

## 2022-04-29 DIAGNOSIS — E039 Hypothyroidism, unspecified: Secondary | ICD-10-CM | POA: Diagnosis not present

## 2022-04-29 DIAGNOSIS — E785 Hyperlipidemia, unspecified: Secondary | ICD-10-CM | POA: Diagnosis not present

## 2022-04-29 DIAGNOSIS — E291 Testicular hypofunction: Secondary | ICD-10-CM | POA: Diagnosis not present

## 2022-04-29 DIAGNOSIS — R5382 Chronic fatigue, unspecified: Secondary | ICD-10-CM | POA: Diagnosis not present

## 2022-04-29 DIAGNOSIS — N400 Enlarged prostate without lower urinary tract symptoms: Secondary | ICD-10-CM | POA: Diagnosis not present

## 2022-04-30 ENCOUNTER — Ambulatory Visit: Payer: Medicare PPO | Admitting: Neurology

## 2022-04-30 ENCOUNTER — Ambulatory Visit: Payer: Medicare PPO | Admitting: Specialist

## 2022-05-07 NOTE — Progress Notes (Deleted)
Cardiology Office Note:    Date:  05/07/2022   ID:  KOEHN SALEHI, DOB September 10, 1955, MRN [id number]  PCP:  Cher Nakai, MD  Cardiologist:  Jenean Lindau, MD  Electrophysiologist:  Constance Haw, MD   Referring MD: [name], MD   Chief Complaint: follow-up of labile BP  History of Present Illness:    Antonio Oliver is a 66 y.o. male with a history of  normal coronaries on  cardiac catheterization in 01/2017, bicuspid aortic valve with severe aortic stenosis s/p AVR in 03/2017, complete heart block s/p St Jude PPM in 03/2017, hypertension, hyperlipidemia,  type 2 diabetes mellitus, hypothyroidism, GERD, iron deficiency anemia, and migraine who is followed by dr. [name] who is presents today for follow-up of labile BP.   Patient has a history of bicuspid aortic valve with severe aortic stenosis and thoracic aortic aneurysm. He underwent a AVR in 03/2017 at [facility]. Cardiac catheterization prior to this procedure showed normal coronaries. Post-op course was complicated by complete heart block and he required placement of St Jude PPM. Last ischemic evaluation was a Myoview in 04/2021 which was low risk and showed no evidence of ischemia. Last Echo in 12/2021 showed LVEF of 60-65% with moderate LVH of the basal septum but normal wall motion and diastolic function as well functioning bioprosthetic aortic valve with mean gradient of 11 mmHg and moderate posteriorly directed mitral regurgitation. CTA in 12/2021 showed a stable 4.3cm ascending thoracic aorta. Renal dopplers in 01/2022 for further evaluation of labile BP showed no evidence of significant renal artery stenosis.   Patient was last seen by me in 03/2022 at which time he continued to report labile BP and orthostatic symptoms. Systolic BP was ranging from 85 to the 160s at home but BP was well controlled in the office. He reported dizziness and a sensation that "everything was twirling" as well as unsteadiness of his feet. He  described near syncope but no overt syncope. Orthostatics were positive in the office. Losartan was stopped and he was prescribed PRN Hydralazine to take if systolic BP > 017 or diastolic BP > 510. There was also concern that some of his symptoms of dizziness and unsteadiness of his feet could be due to vertigo as well as polypharmacy for his chronic pain and anxiety/ depression and he was asked to discuss this with his PCP. Of note, he was also noted to have a new RBBB block - this was discussed with Dr. [name] and no additional work-up was felt to be necessary.  Since last visit, he was recently admitted from 04/12/2022 to 04/16/2022 for septic bursitis of the elbow. He underwent elbow olecranon bursectomy with right elbow I&D of posterior elbow abscess. He was noted to have intermittent wide complex rhythm which was felt to likely be intermittent pacing vs abberancy with rate around 100 bpm.  Patient presents today for follow-up. ***  Labile Hypertension Orthostatic Hypotension Patient has a history of labile BP with orthostatic hypotension. *** renal artery dopplers in 01/2022 showed 1-59% stenosis of the right renal artery and no stenosis of the left. - Orthostatics *** in the office today.  - Continue Toprol-XL '25mg'$  twice daily. - Continue PRN Hydralazine 12.'5mg'$  to take only if systolic BP >258 or diastolic BP >527. He can take this up to twice per day.  - Discussed importance of staying well hydrated and continuing to wear compressions stockings.   Bicuspid Aortic Stenosis with Severe Aortic Stenosis s/p AVR S/p AVR  in 03/2017 at [facility]. Last Echo in 12/2021 showed stable bioprosthetic aortic valve with only trivial AI and no evidence of AS. Mean gradient 11.0 mmHg. - Continue SBE prophylaxis prior to dental procedures.   Complete Heart Block s/p St Jude PPM Occurred as a complication after AVR requiring placement of PPM in 03/2017. - Followed by Dr. Curt Bears.   Thoracic Aortic  Aneurysm Chest CTA in 12/2021 showed stable ascending thoracic aorta measuring 4.3 cm. - Plan is for repeat CTA in 12/2022.   Moderate Mitral Regurgitation Noted on Echo in 12/2021. - Plan is for repeat Echo in 12/2022.   Hyperlipidemia Lipid panel in 01/2022 Total Cholesterol 150, Triglycerides 143, HDL 49, LDL 76. - Continue Lipitor '20mg'$  daily.   Type 2 Diabetes Mellitus Hemoglobin A1c 6.1 in 01/2022. - Management per PCP.  Renal Artery Stenosis Renal artery ultrasound in 01/2022 showed 1-59% stenosis of the right renal artery and no stenosis of the left. - Continue aspirin and statin.  Past Medical History:  Diagnosis Date   Anxiety and depression    Ascending aorta dilation ([place]) 03/06/2019   4.3 cm 2022   Benign prostatic hyperplasia without lower urinary tract symptoms 11/17/2016   Last Assessment & Plan:  Stable PSA today.  Formatting of this note might be different from the original. Last Assessment & Plan:  Stable PSA today. Last Assessment & Plan:  Formatting of this note might be different from the original. Stable PSA today.   BPH (benign prostatic hyperplasia)    Bradycardia 02/01/2019   Cervical myelopathy ([place]) 11/14/2018   Charcot's joint of foot 03/05/2019   Chronic back pain 04/03/2013   Chronic inflammatory arthritis 11/17/2016   History of positive rheumatoid factor in the past. Denies placement on immunosuppressive therapy   Chronic pain syndrome    Failed back surgical syndrome.  Chronic neck pain.  Chronic low back pain with sciatica bilateral   Colon cancer screening    2023 cologuard neg   Complete heart block (HCC)    Following AVR s/p St Jude PPM in 03/2017   DM2 (diabetes mellitus, type 2) ([place]) 04/03/2013   Last Assessment & Plan:  Formatting of this note might be different from the original. Diabetes is unchanged.  Continue current treatment regimen. Regular aerobic exercise. Diabetes will be reassessed in 3 months. Will check A1c today. Denies any  problems with feet or sensation.   Elevated PSA 11/2021   Enthesopathy of ankle and tarsus 04/04/2013   Last Assessment & Plan:  Continues with swelling after prolonged standing.  Still  Wearing support hose, which helps.   Essential hypertension 10/30/2018   FHx: migraine headaches 04/04/2013   Fracture of capitate bone of wrist 02/26/2019   Fracture of tibia 03/05/2019   GERD (gastroesophageal reflux disease) 10/30/2018   Glaucoma 04/03/2013   Hemangioma of skin and subcutaneous tissue 07/07/2017   History of aortic valve replacement 2018   bicuspid AV   History of kidney stones 11/17/2016   History of sleep apnea    wt loss->gone   Hypothyroidism 10/30/2018   IDA (iron deficiency anemia) 11/2021   malabs d/t gastric bipass   Lesion of plantar nerve 04/04/2013   Lumbar stenosis with neurogenic claudication 01/08/2021   Major depressive disorder, recurrent ([place])    ECT in the remote past.  Patient disabled due to recurrent depression and chronic pain.   Migraine syndrome    Mixed dyslipidemia 10/30/2018   Morbid obesity ([place]) 10/27/2011   Nondisplaced fracture of medial  cuneiform of left foot, initial encounter for closed fracture 11/16/2018   Osteoarthritis 10/30/2018   Personal history of tobacco use, presenting hazards to health 04/04/2013   Presence of permanent cardiac pacemaker    Severe single current episode of major depressive disorder, without psychotic features ([place]) 11/17/2016   Status post bariatric surgery 04/05/2011   bipass surg --presurg wt 310 lbs   Syncope 02/14/2019   Vitamin D deficiency 10/30/2018    Past Surgical History:  Procedure Laterality Date   ANTERIOR CERVICAL DECOMP/DISCECTOMY FUSION N/A 11/17/2018   Procedure: CERVICAL THREE-CERVICAL FOUR, CERVICAL FOUR-CERVICAL FIVE, CERVICAL FIVE-CERVICAL SIX ANTERIOR CERVICAL DECOMPRESSION/DISCECTOMY FUSION;  Surgeon: Earnie Larsson, MD;  Location: [place];  Service: Neurosurgery;  Laterality: N/A;   AORTIC  VALVE REPLACEMENT  38/2505   APPLICATION OF WOUND VAC Left 04/05/2019   Procedure: Application Of Wound Vac;  Surgeon: Erle Crocker, MD;  Location: [place];  Service: Orthopedics;  Laterality: Left;   BACK SURGERY     x4   CARDIOVASCULAR STRESS TEST  05/05/2021   lexiscan neg   FASCIOTOMY Left 04/05/2019   Left leg 2 compartment fasciotomy   FASCIOTOMY Left 04/05/2019   Procedure: FASCIOTOMY LEFT LOWER LEG;  Surgeon: Erle Crocker, MD;  Location: [place];  Service: Orthopedics;  Laterality: Left;   FOOT NEUROMA SURGERY     GASTRIC BYPASS  2012   HEMATOMA EVACUATION Left 04/05/2019   Procedure: Evacuation Hematoma Left Lower Leg;  Surgeon: Erle Crocker, MD;  Location: [place];  Service: Orthopedics;  Laterality: Left;   HEMORRHOID SURGERY     over 30 years ago   [place] umb   I & D EXTREMITY Left 04/13/2022   Procedure: IRRIGATION AND DEBRIDEMENT OF ELBOW AND BURSECTOMY;  Surgeon: Iran Planas, MD;  Location: [place];  Service: Orthopedics;  Laterality: Left;   LUMBAR LAMINECTOMY/DECOMPRESSION MICRODISCECTOMY N/A 01/08/2021   Procedure: CENTRAL LUMBAR LAMINECTOMY LUMBAR TWO-THREE, LUMBAR THREE-FOUR;  Surgeon: Jessy Oto, MD;  Location: [place];  Service: Orthopedics;  Laterality: N/A;   NASAL SINUS SURGERY     x2   NECK SURGERY     OLECRANON BURSECTOMY  2023   d/t septic bursitis   TRANSTHORACIC ECHOCARDIOGRAM  12/2020   EF normal, mod MR, ascending AA (4.4 cm)    Current Medications: No outpatient medications have been marked as taking for the 05/19/22 encounter (Appointment) with Darreld Mclean, PA-C.     Allergies:   Calcium carbonate and Ms contin [morphine]   Social History   Socioeconomic History   Marital status: Married    Spouse name: susan    Number of children: 1   Years of education: Not on file   Highest education level: Bachelor's degree (e.g., BA, AB, BS)  Occupational History   Not on file  Tobacco Use   Smoking status:  Never   Smokeless tobacco: Never  Vaping Use   Vaping Use: Never used  Substance and Sexual Activity   Alcohol use: Yes    Alcohol/week: 1.0 standard drink of alcohol    Types: 1 Glasses of wine per week    Comment: occ.   Drug use: No   Sexual activity: Not on file  Other Topics Concern   Not on file  Social History Narrative   Patient lives at home with spouse [name]   Educ: BS   Occup: retired Art therapist.  Disabled due to depression and pain.   Tobacco: None  Alc: none   Right handed   Social Determinants of Health   Financial Resource Strain: Not on file  Food Insecurity: Not on file  Transportation Needs: Not on file  Physical Activity: Not on file  Stress: Not on file  Social Connections: Not on file     Family History: The patient's family history includes Anxiety disorder in his daughter; Cancer in his mother; Depression in his daughter; Diabetes in his brother, brother, father, and mother; Heart disease in his brother and father; Hypertension in his brother and brother; Kidney disease in his father; Osteoarthritis in his brother; Rheum arthritis in his brother and mother.  ROS:   Please see the history of present illness.     EKGs/Labs/Other Studies Reviewed:    The following studies were reviewed today:  Myoview 05/05/2021:   The study is normal. The study is low risk. There is no evidence of ischemia.   No ST deviation was noted.   There is normal wall motion.   Left ventricular function is normal. _______________   Echocardiogram 12/08/2021: Impressions:  1. Left ventricular ejection fraction, by estimation, is 60 to 65%. The  left ventricle has normal function. The left ventricle has no regional  wall motion abnormalities. There is moderate hypertrophy of the basal  septum. The rest of the LV segments  demonstrate mild left ventricular hypertrophy. Left ventricular diastolic  parameters were normal.   2. Right ventricular systolic function is  low normal. The right  ventricular size is normal. There is normal pulmonary artery systolic  pressure. The estimated right ventricular systolic pressure is 43.3 mmHg.   3. The mitral valve is abnormal. There is thickening of the mitral valve  leaflets, most notably of the anterior mitral valve leaflet that has been  noted on prior studies. There is moderate, posteriorly directed mitral  valve regurgitation.   4. The aortic valve has been repaired/replaced. There is a bioprosthetic  valve present in the aortic position. Aortic valve mean gradient measures  11.0 mmHg. Aortic valve Vmax measures 2.32 m/s. DI 0.5. There is trivial  aortic regurgitation. No evidence   of aortic stenosis.   5. Aortic dilatation noted. There is borderline dilatation of the aortic  root, measuring 37 mm. There is mild dilatation of the ascending aorta,  measuring 43 mm.   6. The inferior vena cava is normal in size with greater than 50%  respiratory variability, suggesting right atrial pressure of 3 mmHg.   Comparison(s): Compared to prior TTE in 12/2020, there continues to be a normal functioning aortic valve bioprosthesis (prior mean gradient  8.39mHg). The ascending aorta is stable in size at 454m(previously 4485m  _______________   Chest CTA 12/07/2021: Impressions: Stable appearance of the ascending aorta, maximal diameter 4.3 cm. Recommend annual imaging followup by CTA or MRA. This recommendation follows 2010 ACCF/AHA/AATS/ACR/ASA/SCA/SCAI/SIR/STS/SVM Guidelines for the Diagnosis and Management of Patients with Thoracic Aortic Disease. Circulation. 2010; 121: E: I951-O841ortic aneurysm NOS (ICD10-I71.9)   No change in scattered small subpleural nodules no larger than 3 mm. No follow-up necessary. _______________   Renal Artery Ultrasound 02/03/2022: Summary:  - Largest Aortic Diameter: 2.7 cm  - Renal:  Right: Normal size right kidney. Normal right Resistive Index. Normal cortical thickness of  right kidney. 1-59% stenosis of the right renal artery. RRV flow present.  Left:  Normal size of left kidney. Normal left Resistive Index. Normal cortical thickness of the left kidney. No evidence of left renal artery stenosis. LRV flow present. Cyst(s)  noted. Avascular cystic lesion in the upper pole of the left kidney, measuring 2.5 cm in length and 1.9 cm in width. Probable left renal artery nephrocalcinosis vs nephrolithiasis with  comet-tail artifact noted.  - Mesenteric:  Normal Celiac artery and Superior Mesenteric artery findings.     Patent IVC.  EKG:  EKG not ordered today.   Recent Labs: 11/23/2021: TSH 1.42 03/31/2022: BNP 78.4 04/16/2022: ALT 11; BUN 8; Creatinine, Ser 0.85; Hemoglobin 10.2; Magnesium 2.2; Platelets 243; Potassium 4.2; Sodium 137  Recent Lipid Panel    Component Value Date/Time   CHOL 154 11/23/2021 1437   CHOL 161 07/12/2019 0910   TRIG 129.0 11/23/2021 1437   HDL 56.50 11/23/2021 1437   HDL 62 07/12/2019 0910   CHOLHDL 3 11/23/2021 1437   VLDL 25.8 11/23/2021 1437   LDLCALC 71 11/23/2021 1437   LDLCALC 81 07/12/2019 0910    Physical Exam:    Vital Signs: There were no vitals taken for this visit.    Wt Readings from Last 3 Encounters:  04/23/22 197 lb 12.8 oz (89.7 kg)  04/22/22 198 lb (89.8 kg)  04/12/22 204 lb 2.3 oz (92.6 kg)     General: 66 y.o. male in no acute distress. HEENT: Normocephalic and atraumatic. Sclera clear. EOMs intact. Neck: Supple. No carotid bruits. No JVD. Heart: *** RRR. Distinct S1 and S2. No murmurs, gallops, or rubs. Radial and distal pedal pulses 2+ and equal bilaterally. Lungs: No increased work of breathing. Clear to ausculation bilaterally. No wheezes, rhonchi, or rales.  Abdomen: Soft, non-distended, and non-tender to palpation. Bowel sounds present in all 4 quadrants.  MSK: Normal strength and tone for age. *** Extremities: No lower extremity edema.    Skin: Warm and dry. Neuro: Alert and oriented x3. No  focal deficits. Psych: Normal affect. Responds appropriately.   Assessment:    No diagnosis found.  Plan:     Disposition: Follow up in ***   Medication Adjustments/Labs and Tests Ordered: Current medicines are reviewed at length with the patient today.  Concerns regarding medicines are outlined above.  No orders of the defined types were placed in this encounter.  No orders of the defined types were placed in this encounter.   There are no Patient Instructions on file for this visit.   Signed, Darreld Mclean, PA-C  05/07/2022 10:48 PM    [facility]

## 2022-05-13 ENCOUNTER — Telehealth: Payer: Self-pay | Admitting: Neurology

## 2022-05-13 NOTE — Telephone Encounter (Signed)
Patient was seen 04/2022, his wife stated he is having spells to where he feels he is going to black out. He has fell due to this. His legs are giving out on him. He went to his heart doctor and they said it was not due to his heart. They want to see jaffe to be cleared by him

## 2022-05-19 ENCOUNTER — Ambulatory Visit: Payer: Medicare PPO | Admitting: Student

## 2022-05-25 NOTE — Progress Notes (Unsigned)
NEUROLOGY FOLLOW UP OFFICE NOTE  ECTOR LAUREL [id number]  Assessment/Plan:     Migraine without aura, without status migrainosus, not intractable Hypertension - didn't take medication this morning   1.  Migraine prevention:  Emgality monthly 2.  Migraine rescue:  Ubrelvy '100mg'$  3.  Limit use of pain relievers to no more than 2 days out of week to prevent risk of rebound or medication-overuse headache. 4.  Keep headache diary 5.  Advised to take his blood pressure medication 6.  Follow up one year   Subjective:  YUKI PURVES is a 66 year old male with complete heart block s/p PPM, possible rheumatoid arthritis, diabetes, depression/anxiety, chronic back pain and history of cervical myelopathy follows up for black out spells.  He is accompanied by his wife who supplements history.   UPDATE: ***  He reports ***  Rescue therapy:  Ubrelvy Current NSAIDS:  ASA '81mg'$  Current analgesics:  tramadol (daily, for generalized pain); oxycodone (for general pain) Current triptans: none Current ergotamine:  none Current anti-emetic:  none Current muscle relaxants:  tizanidine Current anti-anxiolytic:  BuSpar; clonazepam Current sleep aide:  none Current Antihypertensive medications:  Toprol-XL, Lasix Current Antidepressant medications:  Cymbalta '60mg'$  twice daily, Wellbutrin SR '200mg'$  twice daily, Remeron '60mg'$  Current Anticonvulsant medications:  gabapentin '600mg'$  TID Current anti-CGRP:  Emgality; Ubrelvy '100mg'$  Current Vitamins/Herbal/Supplements:  Iron, folic acid, MVI Current Antihistamines/Decongestants:  Sudafed Other therapy:  none Other medications:  Mirapex   Caffeine:  1 cup coffee daily.   Diet:  Does not drink water or soda.   Exercise:  no Depression:  yes; Anxiety:  Yes.  Major depressive disorder.  Needed to retire in 2002.   Other pain:  Arthralgias, chronic neck pain, Charcot foot Sleep hygiene:  poor   HISTORY:  Onset:  1980s Location:  Usually  bifrontal Quality:  Squeezing, throbbing, pounding Initial Intensity:  7-8/10.  He denies new headache, thunderclap headache or severe headache that wakes from sleep. Aura:  none Premonitory Phase:  none Postdrome:  none Associated symptoms: Photophobia, phonophobia, blurred vision.  He denies associated nausea, vomiting, unilateral numbness or weakness. Initial Duration:  Usually a day.  Has happened up to 3 days. Initial Frequency:  10 to 15 days a month, started Botox in 2018 and improved once a month. Initial Frequency of abortive medication: 1 a month Triggers:  Unknown Relieving factors:  none Activity:  aggravates   CT head 11/14/18 personally reviewed and was normal. CT cervical spine 11/06/18 personally reviewed and demonstrated multilevel severe facet arthropathy with moderate bilateral neural foraminal stenosis at C3-4 and C4-5 as well as prior interbody fusion at C6-7 without stenosis.   Past NSAIDS:  ibuprofen Past analgesics:  Tylenol, Excedrin, Fioricet, Midrin Past abortive triptans:   Zomig '5mg'$ , Sumatriptan '6mg'$  [place] Past abortive ergotamine: none Past muscle relaxants:  Zanaflex Past anti-emetic:  none Past antihypertensive medications:  Verapamil, propranolol, clonidine Past antidepressant medications:  amitriptyline Past anticonvulsant medications:  topiramate '200mg'$  Past anti-CGRP:  Aimovig '140mg'$  Past vitamins/Herbal/Supplements:  none Past antihistamines/decongestants:  none Other past therapies:  Botox (effective but did not like getting all those injections every 3 months)   Family history of headache:  2 brothers had migraines.    PAST MEDICAL HISTORY: Past Medical History:  Diagnosis Date   Anxiety and depression    Ascending aorta dilation ([place]) 03/06/2019   4.3 cm 2022   Benign prostatic hyperplasia without lower urinary tract symptoms 11/17/2016   Last Assessment & Plan:  Stable PSA  today.  Formatting of this note might be different from the original.  Last Assessment & Plan:  Stable PSA today. Last Assessment & Plan:  Formatting of this note might be different from the original. Stable PSA today.   BPH (benign prostatic hyperplasia)    Bradycardia 02/01/2019   Cervical myelopathy ([place]) 11/14/2018   Charcot's joint of foot 03/05/2019   Chronic back pain 04/03/2013   Chronic inflammatory arthritis 11/17/2016   History of positive rheumatoid factor in the past. Denies placement on immunosuppressive therapy   Chronic pain syndrome    Failed back surgical syndrome.  Chronic neck pain.  Chronic low back pain with sciatica bilateral   Colon cancer screening    2023 cologuard neg   Complete heart block (HCC)    Following AVR s/p [employer] PPM in 03/2017   DM2 (diabetes mellitus, type 2) ([place]) 04/03/2013   Last Assessment & Plan:  Formatting of this note might be different from the original. Diabetes is unchanged.  Continue current treatment regimen. Regular aerobic exercise. Diabetes will be reassessed in 3 months. Will check A1c today. Denies any problems with feet or sensation.   Elevated PSA 11/2021   Enthesopathy of ankle and tarsus 04/04/2013   Last Assessment & Plan:  Continues with swelling after prolonged standing.  Still  Wearing support hose, which helps.   Essential hypertension 10/30/2018   FHx: migraine headaches 04/04/2013   Fracture of capitate bone of wrist 02/26/2019   Fracture of tibia 03/05/2019   GERD (gastroesophageal reflux disease) 10/30/2018   Glaucoma 04/03/2013   Hemangioma of skin and subcutaneous tissue 07/07/2017   History of aortic valve replacement 2018   bicuspid AV   History of kidney stones 11/17/2016   History of sleep apnea    wt loss->gone   Hypothyroidism 10/30/2018   IDA (iron deficiency anemia) 11/2021   malabs d/t gastric bipass   Lesion of plantar nerve 04/04/2013   Lumbar stenosis with neurogenic claudication 01/08/2021   Major depressive disorder, recurrent ([place])    ECT in the remote past.   Patient disabled due to recurrent depression and chronic pain.   Migraine syndrome    Mixed dyslipidemia 10/30/2018   Morbid obesity ([place]) 10/27/2011   Nondisplaced fracture of medial cuneiform of left foot, initial encounter for closed fracture 11/16/2018   Osteoarthritis 10/30/2018   Personal history of tobacco use, presenting hazards to health 04/04/2013   Presence of permanent cardiac pacemaker    Severe single current episode of major depressive disorder, without psychotic features ([place]) 11/17/2016   Status post bariatric surgery 04/05/2011   bipass surg --presurg wt 310 lbs   Syncope 02/14/2019   Vitamin D deficiency 10/30/2018    MEDICATIONS: Current Outpatient Medications on File Prior to Visit  Medication Sig Dispense Refill   aspirin EC 81 MG tablet Take 81 mg by mouth daily.     atorvastatin (LIPITOR) 20 MG tablet Take 20 mg by mouth daily at 6 PM.      brexpiprazole (REXULTI) 2 MG TABS tablet Take 2 mg by mouth daily.      buPROPion (WELLBUTRIN SR) 200 MG 12 hr tablet Take 200 mg by mouth 2 (two) times daily.     busPIRone (BUSPAR) 10 MG tablet Take 20 mg by mouth 2 (two) times daily.      CALCIUM CITRATE PO Take 500 mg by mouth 2 (two) times daily.     Cholecalciferol (VITAMIN D-3 PO) Take 1 capsule by mouth daily.  clonazePAM (KLONOPIN) 0.5 MG tablet Take 0.5 mg by mouth 2 (two) times daily as needed for anxiety.     diclofenac Sodium (VOLTAREN) 1 % GEL Apply 1 application topically 4 (four) times daily.     docusate sodium (COLACE) 100 MG capsule Take 1 capsule (100 mg total) by mouth 2 (two) times daily. 10 capsule 0   DULoxetine (CYMBALTA) 60 MG capsule Take 60 mg by mouth 2 (two) times daily.      ferrous sulfate 325 (65 FE) MG EC tablet Take 325 mg by mouth daily.     finasteride (PROSCAR) 5 MG tablet Take 5 mg by mouth daily.     fluticasone (FLONASE) 50 MCG/ACT nasal spray Place 2 sprays into the nose daily as needed for allergies or rhinitis.     folic acid  (FOLVITE) 1 MG tablet Take 1 mg by mouth daily.     furosemide (LASIX) 20 MG tablet Take 1 tablet (20 mg total) by mouth as needed (for leg swelling). 30 tablet 4   Galcanezumab-gnlm (EMGALITY) 120 MG/ML SOAJ Inject 120 mg into the skin every 28 (twenty-eight) days. 1 mL 11   hydrALAZINE (APRESOLINE) 25 MG tablet Take 0.5 tablets (12.5 mg total) by mouth 2 (two) times daily as needed. 90 tablet 3   levothyroxine (SYNTHROID) 125 MCG tablet Take 125 mcg by mouth daily.      loratadine (CLARITIN) 10 MG tablet Take 10 mg by mouth daily as needed for allergies.     MAGNESIUM PO Take 1 tablet by mouth daily.     meclizine (ANTIVERT) 25 MG tablet Take 25 mg by mouth 3 (three) times daily as needed for dizziness.     metFORMIN (GLUCOPHAGE) 1000 MG tablet Take 1,000 mg by mouth 2 (two) times daily with a meal.     metoprolol succinate (TOPROL-XL) 50 MG 24 hr tablet Take 25 mg by mouth 2 (two) times daily.     mirtazapine (REMERON) 30 MG tablet Take 30 mg by mouth at bedtime.      Multiple Vitamins-Minerals (MULTIVITAMIN WITH MINERALS) tablet Take 1 tablet by mouth daily.     Multiple Vitamins-Minerals (PRESERVISION/LUTEIN) CAPS Take 1 capsule by mouth at bedtime.     mupirocin ointment (BACTROBAN) 2 % Apply 1 application topically 3 (three) times daily as needed (infection).     naloxone (NARCAN) nasal spray 4 mg/0.1 mL Instill or spray into the nose if you are experiencing shortness of breath or signs of opioid overdose. 1 each 0   omeprazole (PRILOSEC) 20 MG capsule Take 20 mg by mouth 2 (two) times daily before a meal.      oxyCODONE-acetaminophen (PERCOCET) 10-325 MG tablet Take 0.5 tablets by mouth See admin instructions. 1/2 tablet every 3 hours.     polyethylene glycol (MIRALAX / GLYCOLAX) 17 g packet Take 17 g by mouth daily. 14 each 0   potassium chloride SA (KLOR-CON M) 20 MEQ tablet Take 20 mEq by mouth daily as needed (with lasix use).     pregabalin (LYRICA) 100 MG capsule Take 100 mg by mouth  3 (three) times daily.     Semaglutide (OZEMPIC, 0.25 OR 0.5 MG/DOSE, [place]) Inject 0.5 mg into the skin every Monday.     silodosin (RAPAFLO) 8 MG CAPS capsule Take 8 mg by mouth daily.     solifenacin (VESICARE) 10 MG tablet Take 10 mg by mouth at bedtime.     tiZANidine (ZANAFLEX) 4 MG tablet Take 4 mg by mouth 2 (two) times  daily.     traMADol (ULTRAM) 50 MG tablet Take 25 mg by mouth See admin instructions. 25 mg every 3 hours     Ubrogepant (UBRELVY) 100 MG TABS Take 1 tablet by mouth as needed (May repeat in 2 hours.  Maximum 2 tablets in 24 hours.). 16 tablet 11   No current facility-administered medications on file prior to visit.    ALLERGIES: Allergies  Allergen Reactions   Calcium Carbonate Other (See Comments)    Pt had gastric bypass, can not absorb calcium carbonate - able to absorb calcium citrate   Ms Contin [Morphine] Itching    Can take with Benadryl, facial and nose itching     FAMILY HISTORY: Family History  Problem Relation Age of Onset   Cancer Mother        breast   Rheum arthritis Mother    Diabetes Mother    Heart disease Father    Kidney disease Father    Diabetes Father    Hypertension Brother    Diabetes Brother    Osteoarthritis Brother    Heart disease Brother    Hypertension Brother    Diabetes Brother    Rheum arthritis Brother    Depression Daughter    Anxiety disorder Daughter       Objective:  *** General: No acute distress.  Patient appears ***-groomed.   Head:  Normocephalic/atraumatic Eyes:  Fundi examined but not visualized Neck: supple, no paraspinal tenderness, full range of motion Heart:  Regular rate and rhythm Lungs:  Clear to auscultation bilaterally Back: No paraspinal tenderness Neurological Exam: alert and oriented to person, place, and time.  Speech fluent and not dysarthric, language intact.  CN II-XII intact. Bulk and tone normal, muscle strength 5/5 throughout.  Sensation to light touch intact.  Deep tendon reflexes  2+ throughout, toes downgoing.  Finger to nose testing intact.  Gait normal, Romberg negative.   Metta Clines, DO  CC: ***

## 2022-05-25 NOTE — Telephone Encounter (Signed)
Sent up front for scheduling  

## 2022-05-27 ENCOUNTER — Ambulatory Visit: Payer: Medicare PPO | Admitting: Neurology

## 2022-05-27 ENCOUNTER — Encounter: Payer: Self-pay | Admitting: Neurology

## 2022-05-27 ENCOUNTER — Ambulatory Visit: Payer: Medicare PPO | Admitting: Specialist

## 2022-05-27 VITALS — BP 127/85 | HR 77 | Ht 75.0 in | Wt 197.0 lb

## 2022-05-27 DIAGNOSIS — R251 Tremor, unspecified: Secondary | ICD-10-CM

## 2022-05-27 DIAGNOSIS — G901 Familial dysautonomia [Riley-Day]: Secondary | ICD-10-CM

## 2022-05-27 DIAGNOSIS — G20 Parkinson's disease: Secondary | ICD-10-CM

## 2022-05-27 DIAGNOSIS — G20C Parkinsonism, unspecified: Secondary | ICD-10-CM

## 2022-05-27 NOTE — Patient Instructions (Addendum)
We will check a DaT scan Further recommendations pending results.  Otherwise follow up 6 months.

## 2022-06-11 ENCOUNTER — Ambulatory Visit (INDEPENDENT_AMBULATORY_CARE_PROVIDER_SITE_OTHER): Payer: Medicare PPO | Admitting: Specialist

## 2022-06-11 ENCOUNTER — Ambulatory Visit (INDEPENDENT_AMBULATORY_CARE_PROVIDER_SITE_OTHER): Payer: Medicare PPO

## 2022-06-11 ENCOUNTER — Encounter: Payer: Self-pay | Admitting: Specialist

## 2022-06-11 VITALS — BP 177/106 | HR 60 | Ht 75.0 in | Wt 197.0 lb

## 2022-06-11 DIAGNOSIS — M4326 Fusion of spine, lumbar region: Secondary | ICD-10-CM

## 2022-06-11 DIAGNOSIS — M4316 Spondylolisthesis, lumbar region: Secondary | ICD-10-CM

## 2022-06-11 DIAGNOSIS — M5412 Radiculopathy, cervical region: Secondary | ICD-10-CM

## 2022-06-11 DIAGNOSIS — Z9889 Other specified postprocedural states: Secondary | ICD-10-CM | POA: Diagnosis not present

## 2022-06-11 DIAGNOSIS — M5416 Radiculopathy, lumbar region: Secondary | ICD-10-CM

## 2022-06-11 DIAGNOSIS — Z981 Arthrodesis status: Secondary | ICD-10-CM

## 2022-06-11 DIAGNOSIS — M5136 Other intervertebral disc degeneration, lumbar region: Secondary | ICD-10-CM

## 2022-06-11 DIAGNOSIS — M4322 Fusion of spine, cervical region: Secondary | ICD-10-CM

## 2022-06-11 NOTE — Progress Notes (Signed)
Office Visit Note   Patient: Antonio Oliver           Date of Birth: [birth year]           MRN: [id number] Visit Date: 06/11/2022              Requested by: [name], MD [address] [place],  [place] [address] PCP: Cher Nakai, MD   Assessment & Plan: Visit Diagnoses:  1. Fusion of lumbar spine   2. Status post lumbar laminectomy   3. Lumbar radiculopathy   4. Spondylolisthesis, lumbar region   5. DDD (degenerative disc disease), lumbar   6. Radiculopathy, cervical region   7. Cervical vertebral fusion   8. History of lumbar fusion     Plan: Avoid overhead lifting and overhead use of the arms. Do not lift greater than 5 lbs. Adjust head rest in vehicle to prevent hyperextension if rear ended. Take extra precautions to avoid falling, including use of a cane if you feel weak. Avoid bending, stooping and avoid lifting weights greater than 10 lbs. Avoid prolong standing and walking You will likely need cervical spine surgery with extension of the cervical fusion to C2-3 and down to C7-T1.  Also likely to need surgical treatment of the 2 levels above the lumbar fusion with extension of the fusion and restoration of alignment. Surgery is a two level lumbar fusion L3-4and L2-3 this would be done with rods, screws and cages with local bone graft and allograft (donor bone graft). Take medications for for pain. MRI of the lumbar spine and cervical spine are ordered Will request consult for treatment with Dr. Deri Fuelling.  Expect improved walking and standing tolerance. Expect relief of leg pain but numbness may persist depending on the length and degree of pressure that has been present.    Follow-Up Instructions: Return in about 3 weeks (around 07/02/2022).   Orders:  Orders Placed This Encounter  Procedures   XR Lumbar Spine 2-3 Views   MR CERVICAL SPINE WO CONTRAST   MR Lumbar Spine W Wo Contrast   Ambulatory referral to Neurosurgery   No orders of the defined  types were placed in this encounter.     Procedures: No procedures performed   Clinical Data: No additional findings.   Subjective: Chief Complaint  Patient presents with   Lower Back - Pain    66 year old male with history of myelomalacia cervical spine post cervical fusions C6-7 in the late 90s early 2000 and a more recent 3 level ACDF by Dr. Trenton Gammon. He underwent L2-3 and L3-4 central laminectomy for stenosis in 5/22, has had decline in function unable to stand and walk other than short distance. Increasing arm and leg weakness, especially the left leg with need to lift the left leg to get it into the car. No Bowel or bladder difficulty. Recent fall and he is falling often. Last with septic left olecranon bursitis treated by Dr. Caralyn Guile in 8/23, this is healed, he is off antibiotics. He has diabetes, non insulin dependent, metformin and ozempic.    Review of Systems  Constitutional: Negative.   HENT: Negative.    Eyes: Negative.   Respiratory: Negative.    Cardiovascular: Negative.   Gastrointestinal: Negative.   Endocrine: Negative.   Genitourinary: Negative.   Musculoskeletal: Negative.   Skin: Negative.   Allergic/Immunologic: Negative.   Neurological: Negative.   Hematological: Negative.   Psychiatric/Behavioral: Negative.       Objective: Vital  Signs: BP (!) 177/106   Pulse 60   Ht '6\' 3"'$  (1.905 m)   Wt 197 lb (89.4 kg)   BMI 24.62 kg/m   Physical Exam Constitutional:      Appearance: He is well-developed.  HENT:     Head: Normocephalic and atraumatic.  Eyes:     Pupils: Pupils are equal, round, and reactive to light.  Pulmonary:     Effort: Pulmonary effort is normal.     Breath sounds: Normal breath sounds.  Abdominal:     General: Bowel sounds are normal.     Palpations: Abdomen is soft.  Musculoskeletal:     Cervical back: Normal range of motion and neck supple.     Lumbar back: Negative right straight leg raise test and negative left straight  leg raise test.  Skin:    General: Skin is warm and dry.  Neurological:     Mental Status: He is alert and oriented to person, place, and time.  Psychiatric:        Behavior: Behavior normal.        Thought Content: Thought content normal.        Judgment: Judgment normal.     Back Exam   Tenderness  The patient is experiencing tenderness in the cervical and lumbar.  Range of Motion  Extension:  abnormal  Flexion:  abnormal  Lateral bend right:  abnormal  Lateral bend left:  abnormal  Rotation right:  abnormal  Rotation left:  abnormal   Muscle Strength  Right Quadriceps:  5/5  Left Quadriceps:  4/5  Right Hamstrings:  5/5  Left Hamstrings:  4/5   Tests  Straight leg raise right: negative Straight leg raise left: negative  Reflexes  Patellar:  0/4 Achilles:  2/4  Other  Toe walk: abnormal Heel walk: abnormal Sensation: decreased Gait: Trendelenburg  Erythema: no back redness Scars: present  Comments:  Cervical spine ROM dcreased by 60%, right lateral bend and rotation more than left. [place] to the right . Tender C2-3 left lateral massess posteriorly Positive spurling sign. Weak left C7 distribution triceps wrist VF and finger extension. Intrinsic motor is weak both hands right more than left.  CT Cervical spine suggestion of ST density posterior to C2-3 disc above C3 to C7 fusion. Severe C8 neuroforamenal narrowing.  MRI from 2/23 with HNP central and left L3-4 with Grade 2 anterolisthesis L3-4 , DDD with bulging disc and disc endplate reactive changes L2-3  Weakness bilateral quad left more than right. Left more than right hip flexion weakness 4/5 and left foot DF 4/5.       Specialty Comments:  No specialty comments available.  Imaging: XR Lumbar Spine 2-3 Views  Result Date: 06/11/2022 Lumbar fusion L4 to S1 with retained pedicle screws and rods, loss of lordosis over these segments. Grade 2 anterolisthesis L3-4 , DDD with bulging disc and disc  endplate reactive changes L2-3. MRI from 2/23 with left and central HNP L3-4, spondylolisthesis at this segment. Disc bulge with DDD L2-3.     PMFS History: Patient Active Problem List   Diagnosis Date Noted   Septic bursitis 04/16/2022   Right bundle branch block 04/15/2022   Orthostasis 04/15/2022   History of aortic stenosis 04/13/2022   Septic bursitis of elbow 04/12/2022   Diabetes mellitus type 2 in nonobese ([place]) 04/16/2021   Lumbar stenosis with neurogenic claudication 01/08/2021   Back pain 01/05/2021   BPH (benign prostatic hyperplasia)    Anxiety and depression  Hypertension associated with diabetes ([place])    Presence of permanent cardiac pacemaker 12/28/2019   Ascending aorta dilation ([place]) 03/06/2019   Cervical myelopathy ([place]) 11/14/2018   Hyperlipidemia associated with type 2 diabetes mellitus ([place]) 10/30/2018   Vitamin D deficiency 10/30/2018   Osteoarthritis 10/30/2018   GERD (gastroesophageal reflux disease) 10/30/2018   Hypothyroidism 10/30/2018   Essential hypertension 10/30/2018   Mixed dyslipidemia 10/30/2018   Hemangioma of skin and subcutaneous tissue 07/07/2017   Lentigo 07/07/2017   Multiple benign melanocytic nevi 07/07/2017   Complete heart block (HCC) 05/04/2017   S/P AVR (aortic valve replacement) 03/10/2017   Bicuspid aortic valve 12/22/2016   Benign prostatic hyperplasia without lower urinary tract symptoms 11/17/2016   Chronic inflammatory arthritis 11/17/2016   Chronic bilateral low back pain 11/17/2016   Heart murmur 11/17/2016   History of kidney stones 11/17/2016   Anxiety 06/20/2013   Personal history of tobacco use, presenting hazards to health 04/04/2013   Neck pain 04/04/2013   Status post bariatric surgery 04/04/2013   FHx: migraine headaches 04/04/2013   DM2 (diabetes mellitus, type 2) ([place]) 04/03/2013   Chronic pain 04/03/2013   Glaucoma 04/03/2013   Morbid obesity ([place]) 10/27/2011   Past Medical History:  Diagnosis Date    Anxiety and depression    Ascending aorta dilation ([place]) 03/06/2019   4.3 cm 2022   Benign prostatic hyperplasia without lower urinary tract symptoms 11/17/2016   Last Assessment & Plan:  Stable PSA today.  Formatting of this note might be different from the original. Last Assessment & Plan:  Stable PSA today. Last Assessment & Plan:  Formatting of this note might be different from the original. Stable PSA today.   BPH (benign prostatic hyperplasia)    Bradycardia 02/01/2019   Cervical myelopathy ([place]) 11/14/2018   Charcot's joint of foot 03/05/2019   Chronic back pain 04/03/2013   Chronic inflammatory arthritis 11/17/2016   History of positive rheumatoid factor in the past. Denies placement on immunosuppressive therapy   Chronic pain syndrome    Failed back surgical syndrome.  Chronic neck pain.  Chronic low back pain with sciatica bilateral   Colon cancer screening    2023 cologuard neg   Complete heart block (HCC)    Following AVR s/p St Jude PPM in 03/2017   DM2 (diabetes mellitus, type 2) ([place]) 04/03/2013   Last Assessment & Plan:  Formatting of this note might be different from the original. Diabetes is unchanged.  Continue current treatment regimen. Regular aerobic exercise. Diabetes will be reassessed in 3 months. Will check A1c today. Denies any problems with feet or sensation.   Elevated PSA 11/2021   Enthesopathy of ankle and tarsus 04/04/2013   Last Assessment & Plan:  Continues with swelling after prolonged standing.  Still  Wearing support hose, which helps.   Essential hypertension 10/30/2018   FHx: migraine headaches 04/04/2013   Fracture of capitate bone of wrist 02/26/2019   Fracture of tibia 03/05/2019   GERD (gastroesophageal reflux disease) 10/30/2018   Glaucoma 04/03/2013   Hemangioma of skin and subcutaneous tissue 07/07/2017   History of aortic valve replacement 2018   bicuspid AV   History of kidney stones 11/17/2016   History of sleep apnea    wt loss->gone    Hypothyroidism 10/30/2018   IDA (iron deficiency anemia) 11/2021   malabs d/t gastric bipass   Lesion of plantar nerve 04/04/2013   Lumbar stenosis with neurogenic claudication 01/08/2021   Major depressive disorder, recurrent ([place])  ECT in the remote past.  Patient disabled due to recurrent depression and chronic pain.   Migraine syndrome    Mixed dyslipidemia 10/30/2018   Morbid obesity ([place]) 10/27/2011   Nondisplaced fracture of medial cuneiform of left foot, initial encounter for closed fracture 11/16/2018   Osteoarthritis 10/30/2018   Personal history of tobacco use, presenting hazards to health 04/04/2013   Presence of permanent cardiac pacemaker    Severe single current episode of major depressive disorder, without psychotic features ([place]) 11/17/2016   Status post bariatric surgery 04/05/2011   bipass surg --presurg wt 310 lbs   Syncope 02/14/2019   Vitamin D deficiency 10/30/2018    Family History  Problem Relation Age of Onset   Cancer Mother        breast   Rheum arthritis Mother    Diabetes Mother    Heart disease Father    Kidney disease Father    Diabetes Father    Hypertension Brother    Diabetes Brother    Osteoarthritis Brother    Heart disease Brother    Hypertension Brother    Diabetes Brother    Rheum arthritis Brother    Depression Daughter    Anxiety disorder Daughter     Past Surgical History:  Procedure Laterality Date   ANTERIOR CERVICAL DECOMP/DISCECTOMY FUSION N/A 11/17/2018   Procedure: CERVICAL THREE-CERVICAL FOUR, CERVICAL FOUR-CERVICAL FIVE, CERVICAL FIVE-CERVICAL SIX ANTERIOR CERVICAL DECOMPRESSION/DISCECTOMY FUSION;  Surgeon: Earnie Larsson, MD;  Location: [facility];  Service: Neurosurgery;  Laterality: N/A;   AORTIC VALVE REPLACEMENT  [id number]   APPLICATION OF WOUND VAC Left 04/05/2019   Procedure: Application Of Wound Vac;  Surgeon: Erle Crocker, MD;  Location: [facility];  Service: Orthopedics;  Laterality: Left;   BACK SURGERY     x4    CARDIOVASCULAR STRESS TEST  05/05/2021   lexiscan neg   FASCIOTOMY Left 04/05/2019   Left leg 2 compartment fasciotomy   FASCIOTOMY Left 04/05/2019   Procedure: FASCIOTOMY LEFT LOWER LEG;  Surgeon: Erle Crocker, MD;  Location: [facility];  Service: Orthopedics;  Laterality: Left;   FOOT NEUROMA SURGERY     GASTRIC BYPASS  2012   HEMATOMA EVACUATION Left 04/05/2019   Procedure: Evacuation Hematoma Left Lower Leg;  Surgeon: Erle Crocker, MD;  Location: [facility];  Service: Orthopedics;  Laterality: Left;   HEMORRHOID SURGERY     over 30 years ago   [facility] umb   I & D EXTREMITY Left 04/13/2022   Procedure: IRRIGATION AND DEBRIDEMENT OF ELBOW AND BURSECTOMY;  Surgeon: Iran Planas, MD;  Location: [facility];  Service: Orthopedics;  Laterality: Left;   LUMBAR LAMINECTOMY/DECOMPRESSION MICRODISCECTOMY N/A 01/08/2021   Procedure: CENTRAL LUMBAR LAMINECTOMY LUMBAR TWO-THREE, LUMBAR THREE-FOUR;  Surgeon: Jessy Oto, MD;  Location: [facility];  Service: Orthopedics;  Laterality: N/A;   NASAL SINUS SURGERY     x2   NECK SURGERY     OLECRANON BURSECTOMY  2023   d/t septic bursitis   TRANSTHORACIC ECHOCARDIOGRAM  12/2020   EF normal, mod MR, ascending AA (4.4 cm)   Social History   Occupational History   Not on file  Tobacco Use   Smoking status: Never   Smokeless tobacco: Never  Vaping Use   Vaping Use: Never used  Substance and Sexual Activity   Alcohol use: Yes    Alcohol/week: 1.0 standard drink of alcohol    Types: 1 Glasses of wine per week    Comment:  occ.   Drug use: No   Sexual activity: Not on file

## 2022-06-11 NOTE — Patient Instructions (Signed)
Avoid overhead lifting and overhead use of the arms. Do not lift greater than 5 lbs. Adjust head rest in vehicle to prevent hyperextension if rear ended. Take extra precautions to avoid falling, including use of a cane if you feel weak. Avoid bending, stooping and avoid lifting weights greater than 10 lbs. Avoid prolong standing and walking You will likely need cervical spine surgery with extension of the cervical fusion to C2-3 and down to C7-T1.  Also likely to need surgical treatment of the 2 levels above the lumbar fusion with extension of the fusion and restoration of alignment. Surgery is a two level lumbar fusion L3-4and L2-3 this would be done with rods, screws and cages with local bone graft and allograft (donor bone graft). Take medications for for pain. MRI of the lumbar spine and cervical spine are ordered Will request consult for treatment with Dr. Deri Fuelling.  Expect improved walking and standing tolerance. Expect relief of leg pain but numbness may persist depending on the length and degree of pressure that has been present.

## 2022-06-22 ENCOUNTER — Ambulatory Visit (HOSPITAL_COMMUNITY)
Admission: RE | Admit: 2022-06-22 | Discharge: 2022-06-22 | Disposition: A | Discharge status: Home / Self Care | Payer: Medicare PPO | Source: Ambulatory Visit | Attending: Specialist | Admitting: Specialist

## 2022-06-22 DIAGNOSIS — M4316 Spondylolisthesis, lumbar region: Secondary | ICD-10-CM | POA: Insufficient documentation

## 2022-06-22 DIAGNOSIS — M5136 Other intervertebral disc degeneration, lumbar region: Secondary | ICD-10-CM | POA: Diagnosis present

## 2022-06-22 DIAGNOSIS — M4322 Fusion of spine, cervical region: Secondary | ICD-10-CM | POA: Diagnosis present

## 2022-06-22 DIAGNOSIS — M5412 Radiculopathy, cervical region: Secondary | ICD-10-CM | POA: Insufficient documentation

## 2022-06-22 DIAGNOSIS — M5416 Radiculopathy, lumbar region: Secondary | ICD-10-CM

## 2022-06-22 DIAGNOSIS — M4326 Fusion of spine, lumbar region: Secondary | ICD-10-CM

## 2022-06-22 MED ORDER — GADOBUTROL 1 MMOL/ML IV SOLN
9.0000 mL | Freq: Once | INTRAVENOUS | Status: AC | PRN
  Administered 2022-06-22: 9 mL via INTRAVENOUS

## 2022-06-22 MED ORDER — GADOBENATE DIMEGLUMINE 529 MG/ML IV SOLN
9.0000 mL | Freq: Once | INTRAVENOUS | Status: DC | PRN
Start: 2022-06-22 — End: 2022-06-23

## 2022-06-22 NOTE — Progress Notes (Signed)
Informed of MRI for today.   Device system confirmed to be MRI conditional, with implant date > 6 weeks ago, and no evidence of abandoned or epicardial leads in review of most recent CXR  Interrogation from today reviewed, pt is currently AS-VS. ~ 20% AP and <1% VP.   [employer] rep recommends no changes for MRI  Annamaria Helling  06/22/2022 2:43 PM

## 2022-06-22 NOTE — Progress Notes (Signed)
Patient here today at [facility] for dual study. Lumbar w wo and cervical spine wo contrast. Patient has St.Jude device. Transmission sent. No changes necessary for device per industry rep.

## 2022-07-15 ENCOUNTER — Ambulatory Visit (HOSPITAL_COMMUNITY): Payer: Medicare PPO

## 2022-07-15 ENCOUNTER — Ambulatory Visit (HOSPITAL_COMMUNITY)
Admission: RE | Admit: 2022-07-15 | Discharge: 2022-07-15 | Disposition: A | Discharge status: Home / Self Care | Payer: Medicare PPO | Source: Ambulatory Visit | Attending: Neurology | Admitting: Neurology

## 2022-07-15 DIAGNOSIS — R251 Tremor, unspecified: Secondary | ICD-10-CM | POA: Insufficient documentation

## 2022-07-16 ENCOUNTER — Telehealth: Payer: Self-pay | Admitting: Neurology

## 2022-07-16 NOTE — Telephone Encounter (Signed)
Pt's wife called in stating the pt tried to have the [place], but he couldn't get into the correct position due to his neck deformity. She is wondering what the possible next step should be?

## 2022-07-19 ENCOUNTER — Ambulatory Visit (INDEPENDENT_AMBULATORY_CARE_PROVIDER_SITE_OTHER): Payer: Medicare PPO

## 2022-07-19 ENCOUNTER — Encounter: Payer: Self-pay | Admitting: Physical Medicine & Rehabilitation

## 2022-07-19 DIAGNOSIS — I442 Atrioventricular block, complete: Secondary | ICD-10-CM

## 2022-07-19 NOTE — Progress Notes (Signed)
HPI: Follow-up history of aortic valve replacement and thoracic aortic aneurysm. Patient has a history of bicuspid aortic valve and underwent aortic valve replacement March 10, 2017.  Procedure was complicated by complete heart block requiring pacemaker placement.  Note preoperative cardiac catheterization revealed no coronary disease. Nuclear study August 2022 showed no ischemia.  Echocardiogram repeated April 2023 and showed normal LV function, moderate left ventricular hypertrophy, moderate posteriorly directed mitral regurgitation, previous aortic valve replacement with mean gradient 11 mmHg and trace aortic insufficiency, mild dilatation of the ascending aorta at 43 mm.  CTA April 2023 showed 4.3 cm ascending aorta.  Renal Dopplers May 2023 showed 1 to 59% right and no stenosis on the left.  Since last seen there is no dyspnea, chest pain or syncope.  He does continue to have "spells".  These are dizzy spells/near syncope followed by weakness.  The dizzy spells last 2 hours and he feels weak for 2 days afterwards.  He has been seen by neurology.  He has not had frank syncope.  Current Outpatient Medications  Medication Sig Dispense Refill   aspirin EC 81 MG tablet Take 81 mg by mouth daily.     atorvastatin (LIPITOR) 20 MG tablet Take 20 mg by mouth daily at 6 PM.      brexpiprazole (REXULTI) 2 MG TABS tablet Take 2 mg by mouth daily.      buPROPion (WELLBUTRIN SR) 200 MG 12 hr tablet Take 200 mg by mouth 2 (two) times daily.     busPIRone (BUSPAR) 10 MG tablet Take 20 mg by mouth 2 (two) times daily.      CALCIUM CITRATE PO Take 500 mg by mouth 2 (two) times daily.     Cholecalciferol (VITAMIN D-3 PO) Take 1 capsule by mouth daily.     clonazePAM (KLONOPIN) 0.5 MG tablet Take 0.5 mg by mouth 2 (two) times daily as needed for anxiety.     diclofenac Sodium (VOLTAREN) 1 % GEL Apply 1 application topically 4 (four) times daily.     docusate sodium (COLACE) 100 MG capsule Take 1 capsule (100 mg  total) by mouth 2 (two) times daily. 10 capsule 0   DULoxetine (CYMBALTA) 60 MG capsule Take 60 mg by mouth 2 (two) times daily.      ferrous sulfate 325 (65 FE) MG EC tablet Take 325 mg by mouth daily.     finasteride (PROSCAR) 5 MG tablet Take 5 mg by mouth daily.     fluticasone (FLONASE) 50 MCG/ACT nasal spray Place 2 sprays into the nose daily as needed for allergies or rhinitis.     folic acid (FOLVITE) 1 MG tablet Take 1 mg by mouth daily.     furosemide (LASIX) 20 MG tablet Take 1 tablet (20 mg total) by mouth as needed (for leg swelling). 30 tablet 4   Galcanezumab-gnlm (EMGALITY) 120 MG/ML SOAJ Inject 120 mg into the skin every 28 (twenty-eight) days. 1 mL 11   hydrALAZINE (APRESOLINE) 25 MG tablet Take 0.5 tablets (12.5 mg total) by mouth 2 (two) times daily as needed. 90 tablet 3   levothyroxine (SYNTHROID) 125 MCG tablet Take 125 mcg by mouth daily.      loratadine (CLARITIN) 10 MG tablet Take 10 mg by mouth daily as needed for allergies.     MAGNESIUM PO Take 1 tablet by mouth daily.     meclizine (ANTIVERT) 25 MG tablet Take 25 mg by mouth 3 (three) times daily as needed for dizziness.  metFORMIN (GLUCOPHAGE) 1000 MG tablet Take 1,000 mg by mouth 2 (two) times daily with a meal.     metoprolol succinate (TOPROL-XL) 50 MG 24 hr tablet Take 25 mg by mouth 2 (two) times daily.     mirtazapine (REMERON) 30 MG tablet Take 30 mg by mouth at bedtime.      Multiple Vitamins-Minerals (MULTIVITAMIN WITH MINERALS) tablet Take 1 tablet by mouth daily.     Multiple Vitamins-Minerals (PRESERVISION/LUTEIN) CAPS Take 1 capsule by mouth at bedtime.     mupirocin ointment (BACTROBAN) 2 % Apply 1 application topically 3 (three) times daily as needed (infection).     naloxone (NARCAN) nasal spray 4 mg/0.1 mL Instill or spray into the nose if you are experiencing shortness of breath or signs of opioid overdose. 1 each 0   omeprazole (PRILOSEC) 20 MG capsule Take 20 mg by mouth 2 (two) times daily  before a meal.      oxyCODONE-acetaminophen (PERCOCET) 10-325 MG tablet Take 0.5 tablets by mouth See admin instructions. 1/2 tablet every 3 hours.     polyethylene glycol (MIRALAX / GLYCOLAX) 17 g packet Take 17 g by mouth daily. 14 each 0   potassium chloride SA (KLOR-CON M) 20 MEQ tablet Take 20 mEq by mouth daily as needed (with lasix use).     pregabalin (LYRICA) 100 MG capsule Take 100 mg by mouth 3 (three) times daily.     Semaglutide (OZEMPIC, 0.25 OR 0.5 MG/DOSE, [place]) Inject 0.5 mg into the skin every Monday.     silodosin (RAPAFLO) 8 MG CAPS capsule Take 8 mg by mouth daily.     solifenacin (VESICARE) 10 MG tablet Take 10 mg by mouth at bedtime.     tiZANidine (ZANAFLEX) 4 MG tablet Take 4 mg by mouth 3 (three) times daily.     traMADol (ULTRAM) 50 MG tablet Take 50 mg by mouth See admin instructions. 25 mg every 3 hours     Ubrogepant (UBRELVY) 100 MG TABS Take 1 tablet by mouth as needed (May repeat in 2 hours.  Maximum 2 tablets in 24 hours.). 16 tablet 11   No current facility-administered medications for this visit.     Past Medical History:  Diagnosis Date   Anxiety and depression    Ascending aorta dilation ([place]) 03/06/2019   4.3 cm 2022   Benign prostatic hyperplasia without lower urinary tract symptoms 11/17/2016   Last Assessment & Plan:  Stable PSA today.  Formatting of this note might be different from the original. Last Assessment & Plan:  Stable PSA today. Last Assessment & Plan:  Formatting of this note might be different from the original. Stable PSA today.   BPH (benign prostatic hyperplasia)    Bradycardia 02/01/2019   Cervical myelopathy ([place]) 11/14/2018   Charcot's joint of foot 03/05/2019   Chronic back pain 04/03/2013   Chronic inflammatory arthritis 11/17/2016   History of positive rheumatoid factor in the past. Denies placement on immunosuppressive therapy   Chronic pain syndrome    Failed back surgical syndrome.  Chronic neck pain.  Chronic low back  pain with sciatica bilateral   Colon cancer screening    2023 cologuard neg   Complete heart block (HCC)    Following AVR s/p [employer] PPM in 03/2017   DM2 (diabetes mellitus, type 2) ([place]) 04/03/2013   Last Assessment & Plan:  Formatting of this note might be different from the original. Diabetes is unchanged.  Continue current treatment regimen. Regular aerobic exercise. Diabetes  will be reassessed in 3 months. Will check A1c today. Denies any problems with feet or sensation.   Elevated PSA 11/2021   Enthesopathy of ankle and tarsus 04/04/2013   Last Assessment & Plan:  Continues with swelling after prolonged standing.  Still  Wearing support hose, which helps.   Essential hypertension 10/30/2018   FHx: migraine headaches 04/04/2013   Fracture of capitate bone of wrist 02/26/2019   Fracture of tibia 03/05/2019   GERD (gastroesophageal reflux disease) 10/30/2018   Glaucoma 04/03/2013   Hemangioma of skin and subcutaneous tissue 07/07/2017   History of aortic valve replacement 2018   bicuspid AV   History of kidney stones 11/17/2016   History of sleep apnea    wt loss->gone   Hypothyroidism 10/30/2018   IDA (iron deficiency anemia) 11/2021   malabs d/t gastric bipass   Lesion of plantar nerve 04/04/2013   Lumbar stenosis with neurogenic claudication 01/08/2021   Major depressive disorder, recurrent ([place])    ECT in the remote past.  Patient disabled due to recurrent depression and chronic pain.   Migraine syndrome    Mixed dyslipidemia 10/30/2018   Morbid obesity ([place]) 10/27/2011   Nondisplaced fracture of medial cuneiform of left foot, initial encounter for closed fracture 11/16/2018   Osteoarthritis 10/30/2018   Personal history of tobacco use, presenting hazards to health 04/04/2013   Presence of permanent cardiac pacemaker    Severe single current episode of major depressive disorder, without psychotic features ([place]) 11/17/2016   Status post bariatric surgery 04/05/2011    bipass surg --presurg wt 310 lbs   Syncope 02/14/2019   Vitamin D deficiency 10/30/2018    Past Surgical History:  Procedure Laterality Date   ANTERIOR CERVICAL DECOMP/DISCECTOMY FUSION N/A 11/17/2018   Procedure: CERVICAL THREE-CERVICAL FOUR, CERVICAL FOUR-CERVICAL FIVE, CERVICAL FIVE-CERVICAL SIX ANTERIOR CERVICAL DECOMPRESSION/DISCECTOMY FUSION;  Surgeon: Earnie Larsson, MD;  Location: [facility];  Service: Neurosurgery;  Laterality: N/A;   AORTIC VALVE REPLACEMENT  [id number]/6712   APPLICATION OF WOUND VAC Left 04/05/2019   Procedure: Application Of Wound Vac;  Surgeon: Erle Crocker, MD;  Location: [facility];  Service: Orthopedics;  Laterality: Left;   BACK SURGERY     x4   CARDIOVASCULAR STRESS TEST  05/05/2021   lexiscan neg   FASCIOTOMY Left 04/05/2019   Left leg 2 compartment fasciotomy   FASCIOTOMY Left 04/05/2019   Procedure: FASCIOTOMY LEFT LOWER LEG;  Surgeon: Erle Crocker, MD;  Location: [facility];  Service: Orthopedics;  Laterality: Left;   FOOT NEUROMA SURGERY     GASTRIC BYPASS  2012   HEMATOMA EVACUATION Left 04/05/2019   Procedure: Evacuation Hematoma Left Lower Leg;  Surgeon: Erle Crocker, MD;  Location: [facility];  Service: Orthopedics;  Laterality: Left;   HEMORRHOID SURGERY     over 30 years ago   [place] umb   I & D EXTREMITY Left 04/13/2022   Procedure: IRRIGATION AND DEBRIDEMENT OF ELBOW AND BURSECTOMY;  Surgeon: Iran Planas, MD;  Location: [facility];  Service: Orthopedics;  Laterality: Left;   LUMBAR LAMINECTOMY/DECOMPRESSION MICRODISCECTOMY N/A 01/08/2021   Procedure: CENTRAL LUMBAR LAMINECTOMY LUMBAR TWO-THREE, LUMBAR THREE-FOUR;  Surgeon: Jessy Oto, MD;  Location: [facility];  Service: Orthopedics;  Laterality: N/A;   NASAL SINUS SURGERY     x2   NECK SURGERY     OLECRANON BURSECTOMY  2023   d/t septic bursitis   TRANSTHORACIC ECHOCARDIOGRAM  12/2020   EF normal, mod MR, ascending AA (  4.4 cm)    Social History   Socioeconomic History    Marital status: Married    Spouse name: susan    Number of children: 1   Years of education: Not on file   Highest education level: Bachelor's degree (e.g., BA, AB, BS)  Occupational History   Not on file  Tobacco Use   Smoking status: Never   Smokeless tobacco: Never  Vaping Use   Vaping Use: Never used  Substance and Sexual Activity   Alcohol use: Yes    Alcohol/week: 1.0 standard drink of alcohol    Types: 1 Glasses of wine per week    Comment: occ.   Drug use: No   Sexual activity: Not on file  Other Topics Concern   Not on file  Social History Narrative   Patient lives at home with spouse [name]   Educ: BS   Occup: retired Art therapist.  Disabled due to depression and pain.   Tobacco: None   Alc: none   Right handed   Social Determinants of Health   Financial Resource Strain: Not on file  Food Insecurity: Not on file  Transportation Needs: Not on file  Physical Activity: Not on file  Stress: Not on file  Social Connections: Not on file  Intimate Partner Violence: Not At Risk (02/15/2019)   Humiliation, Afraid, Rape, and Kick questionnaire    Fear of Current or Ex-Partner: No    Emotionally Abused: No    Physically Abused: No    Sexually Abused: No    Family History  Problem Relation Age of Onset   Cancer Mother        breast   Rheum arthritis Mother    Diabetes Mother    Heart disease Father    Kidney disease Father    Diabetes Father    Hypertension Brother    Diabetes Brother    Osteoarthritis Brother    Heart disease Brother    Hypertension Brother    Diabetes Brother    Rheum arthritis Brother    Depression Daughter    Anxiety disorder Daughter     ROS: no fevers or chills, productive cough, hemoptysis, dysphasia, odynophagia, melena, hematochezia, dysuria, hematuria, rash, seizure activity, orthopnea, PND, pedal edema, claudication. Remaining systems are negative.  Physical Exam: Well-developed well-nourished in no acute distress.   Skin is warm and dry.  HEENT is normal.  Neck is supple.  Chest is clear to auscultation with normal expansion.  Cardiovascular exam is regular rate and rhythm.  1/6 systolic murmur right sternal border.  No diastolic murmur. Abdominal exam nontender or distended. No masses palpated. Extremities show no edema. neuro grossly intact   A/P  1 status post aortic valve replacement-continue SBE prophylaxis.  2 mitral regurgitation-moderate on most recent echocardiogram.  Plan repeat echocardiogram April 2024.  3 thoracic aortic aneurysm-plan follow-up CTA April 2024.  4 hyperlipidemia-continue statin.  5 hypertension-blood pressure elevated; however they check his blood pressure routinely at home and it is typically low with a systolic of 90.  If it gets elevated significantly he takes 12 and half of hydralazine which improves his blood pressure.  I am hesitant to add additional medications as this will likely cause hypotension when his blood pressure is running low at other times.  6 pacemaker-Per EP.  7 palpitations-symptoms are controlled.  Continue beta-blocker at present dose.  8 dizzy spells-these do not sound cardiac in nature.  He is being evaluated by neurology.  Kirk Ruths, MD

## 2022-07-19 NOTE — Telephone Encounter (Signed)
They would like to do the skin Biopsy, Pt wife was advised it takes about 2 weeks for [place] to get PA back from insurance,

## 2022-07-20 LAB — CUP PACEART REMOTE DEVICE CHECK
Battery Remaining Longevity: 73 mo
Battery Remaining Percentage: 56 %
Battery Voltage: 3.02 V
Brady Statistic AP VP Percent: 1 %
Brady Statistic AP VS Percent: 26 %
Brady Statistic AS VP Percent: 1 %
Brady Statistic AS VS Percent: 73 %
Brady Statistic RA Percent Paced: 26 %
Brady Statistic RV Percent Paced: 1 %
Date Time Interrogation Session: 20231113020015
Implantable Lead Connection Status: 753985
Implantable Lead Connection Status: 753985
Implantable Lead Implant Date: 20180710
Implantable Lead Implant Date: 20180710
Implantable Lead Location: 753859
Implantable Lead Location: 753860
Implantable Pulse Generator Implant Date: 20180710
Lead Channel Impedance Value: 460 Ohm
Lead Channel Impedance Value: 480 Ohm
Lead Channel Pacing Threshold Amplitude: 0.5 V
Lead Channel Pacing Threshold Amplitude: 0.875 V
Lead Channel Pacing Threshold Pulse Width: 0.4 ms
Lead Channel Pacing Threshold Pulse Width: 0.4 ms
Lead Channel Sensing Intrinsic Amplitude: 1.2 mV
Lead Channel Sensing Intrinsic Amplitude: 2.1 mV
Lead Channel Setting Pacing Amplitude: 1.125
Lead Channel Setting Pacing Amplitude: 2 V
Lead Channel Setting Pacing Pulse Width: 0.4 ms
Lead Channel Setting Sensing Sensitivity: 0.5 mV
Pulse Gen Model: 2272
Pulse Gen Serial Number: 8912213

## 2022-07-27 ENCOUNTER — Telehealth: Payer: Self-pay

## 2022-07-27 NOTE — Telephone Encounter (Signed)
Patient is sch for Skin biopsy with Tat

## 2022-07-27 NOTE — Telephone Encounter (Signed)
Advised Patient wife of Benefits for CND Biopsy.  Patient has a $40 copay due to CND.  Wife transferred to the front desk to schedule appt.

## 2022-08-02 ENCOUNTER — Encounter: Payer: Self-pay | Admitting: Cardiology

## 2022-08-02 ENCOUNTER — Ambulatory Visit: Payer: Medicare PPO | Admitting: Cardiology

## 2022-08-02 VITALS — BP 166/96 | HR 55 | Ht 75.0 in | Wt 196.8 lb

## 2022-08-02 DIAGNOSIS — E785 Hyperlipidemia, unspecified: Secondary | ICD-10-CM

## 2022-08-02 DIAGNOSIS — I10 Essential (primary) hypertension: Secondary | ICD-10-CM | POA: Diagnosis not present

## 2022-08-02 DIAGNOSIS — I7121 Aneurysm of the ascending aorta, without rupture: Secondary | ICD-10-CM

## 2022-08-02 DIAGNOSIS — R42 Dizziness and giddiness: Secondary | ICD-10-CM

## 2022-08-02 DIAGNOSIS — Z952 Presence of prosthetic heart valve: Secondary | ICD-10-CM

## 2022-08-02 NOTE — Patient Instructions (Signed)
  Follow-Up: At [facility], you and your health needs are our priority.  As part of our continuing mission to provide you with exceptional heart care, we have created designated Provider Care Teams.  These Care Teams include your primary Cardiologist (physician) and Advanced Practice Providers (APPs -  Physician Assistants and Nurse Practitioners) who all work together to provide you with the care you need, when you need it.  We recommend signing up for the patient portal called "MyChart".  Sign up information is provided on this After Visit Summary.  MyChart is used to connect with patients for Virtual Visits (Telemedicine).  Patients are able to view lab/test results, encounter notes, upcoming appointments, etc.  Non-urgent messages can be sent to your provider as well.   To learn more about what you can do with MyChart, go to [email or web address].    Your next appointment:   6 month(s)  The format for your next appointment:   In Person  Provider:   Brian Crenshaw, MD    

## 2022-08-04 ENCOUNTER — Other Ambulatory Visit (HOSPITAL_COMMUNITY): Payer: Self-pay

## 2022-08-06 ENCOUNTER — Other Ambulatory Visit (HOSPITAL_COMMUNITY): Payer: Self-pay

## 2022-08-06 MED ORDER — OZEMPIC (0.25 OR 0.5 MG/DOSE) 2 MG/3ML ~~LOC~~ SOPN
0.5000 mg | PEN_INJECTOR | SUBCUTANEOUS | 3 refills | Status: DC
  Filled 2022-08-06: qty 9, 84d supply, fill #0
  Filled 2022-08-12: qty 3, 28d supply, fill #0
  Filled 2022-09-14: qty 3, 28d supply, fill #1
  Filled 2022-10-06 – 2022-10-13 (×2): qty 3, 28d supply, fill #2
  Filled 2022-11-16: qty 3, 28d supply, fill #3
  Filled 2022-12-03 – 2022-12-07 (×2): qty 3, 28d supply, fill #4
  Filled 2023-01-14: qty 3, 28d supply, fill #5
  Filled 2023-01-14 (×2): qty 9, 84d supply, fill #5
  Filled 2023-01-28: qty 3, 28d supply, fill #5
  Filled 2023-02-24: qty 9, 84d supply, fill #6

## 2022-08-12 ENCOUNTER — Other Ambulatory Visit: Payer: Self-pay

## 2022-08-12 ENCOUNTER — Other Ambulatory Visit (HOSPITAL_COMMUNITY): Payer: Self-pay

## 2022-08-20 ENCOUNTER — Ambulatory Visit: Payer: Medicare PPO | Admitting: Neurology

## 2022-08-20 DIAGNOSIS — G20C Parkinsonism, unspecified: Secondary | ICD-10-CM | POA: Diagnosis not present

## 2022-08-20 DIAGNOSIS — R258 Other abnormal involuntary movements: Secondary | ICD-10-CM

## 2022-08-20 NOTE — Procedures (Addendum)
Punch Biopsy Procedure Note  Preprocedure Diagnosis: Bradykinesia;  Postprocedure Diagnosis: same  Locations: Site 1: left  trapezius ;  Site 2: above, left knee;  Site 3: above, right foot  Indications: r/o alpha synucleinopathy  Anesthesia: 3.5 mL Lidocaine 1% with epinephrine without added sodium bicarbonate  Procedure Details Patient informed of the risks (including but not limited to bleeding, pain, infection, scar and infection) and benefits of the procedure.  Informed consent obtained.  The areas which were chosen for biopsy, as above, and surrounding areas were given a sterile prep using betadyne and draped in the usual sterile fashion. The skin was then stretched perpendicular to the skin tension lines and sample removed using the 3 mm punch. Pressure applied, hemostasis achieved.   Dressing applied. The specimen(s) was sent for pathologic examination. The patient tolerated the procedure well.  Estimated Blood Loss: 0 ml  Condition: Stable  Complications: none.  Plan: 1. Instructed to keep the wound dry and covered for 24-48h and clean thereafter. 2. Warning signs of infection were reviewed.

## 2022-08-25 ENCOUNTER — Encounter: Payer: Self-pay | Admitting: Physical Medicine & Rehabilitation

## 2022-08-25 ENCOUNTER — Encounter: Payer: Medicare PPO | Attending: Physical Medicine & Rehabilitation | Admitting: Physical Medicine & Rehabilitation

## 2022-08-25 VITALS — BP 101/67 | HR 81 | Ht 75.0 in

## 2022-08-25 DIAGNOSIS — M961 Postlaminectomy syndrome, not elsewhere classified: Secondary | ICD-10-CM | POA: Diagnosis present

## 2022-08-25 DIAGNOSIS — G243 Spasmodic torticollis: Secondary | ICD-10-CM | POA: Diagnosis present

## 2022-08-25 NOTE — Progress Notes (Signed)
Subjective:    Patient ID: Antonio Oliver, male    DOB: [birth year], 66 y.o.   MRN: [id number]  HPI  This is an initial evaluation for Antonio Oliver who is here for evaluation of spastic torticollis. He was kindly referred to me by Dr. Earnie Larsson.   Per his wife, his first cervical surgery was in the late 90's and the most recent was in March of 2020. He's had at least one fusion surgery from C3-6. His pain and spasticity in his neck has gradually worsened. He has gotten to the point where he can no longer keep his head rotated or tilted in neutral position.   Most recent MRI is from 06/22/22 and reveals the following:   1. Stable postoperative changes from C3-C6 with anterior and interbody fusion hardware. No complicating features are identified. 2. Stable degenerative subluxation at C6-7 with a bulging uncovered disc. No significant spinal or foraminal stenosis. 3. Small foci of increased T2 signal intensity in the spinal cord at C3-4 suggesting chronic ischemic changes/myelomalacia. 4. Stable small central disc protrusion, osteophytic ridging and facet disease at C2-3 contributing to mild spinal stenosis and moderate right foraminal stenosis.  Over the years for the dystonia he's had physical therapy, dry needling, medications. He is currently on tizanidine '4mg'$  TID and lyrica '100mg'$  tid. He typically uses half of a 10/'325mg'$  percocet 4 x daily. He will use tramadol if percocet doesn't help. He is on magnesium for migraine prevention. He's used several topical rx'es as well without much benefit. He does report tingling in both of his hands, R>L.  He sleeps on his back. He says his sleep is poor. Mood has been a struggle as well given the progression of his symptoms.    Pain Inventory Average Pain 7 Pain Right Now 8 My pain is sharp, dull, stabbing, tingling, and aching  In the last 24 hours, has pain interfered with the following? General activity 9 Relation with others  10 Enjoyment of life 10 What TIME of day is your pain at its worst? morning , daytime, evening, and night Sleep (in general) Poor  Pain is worse with: walking, bending, sitting, inactivity, standing, and some activites Pain improves with: rest and medication Relief from Meds: 3  walk with assistance use a cane how many minutes can you walk? Very few ability to climb steps?  no do you drive?  yes Do you have any goals in this area?  yes  disabled: date disabled 2002  bladder control problems bowel control problems weakness numbness tremor trouble walking confusion depression anxiety  Any changes since last visit?  no  Any changes since last visit?  no    Family History  Problem Relation Age of Onset   Cancer Mother        breast   Rheum arthritis Mother    Diabetes Mother    Heart disease Father    Kidney disease Father    Diabetes Father    Hypertension Brother    Diabetes Brother    Osteoarthritis Brother    Heart disease Brother    Hypertension Brother    Diabetes Brother    Rheum arthritis Brother    Depression Daughter    Anxiety disorder Daughter    Social History   Socioeconomic History   Marital status: Married    Spouse name: Antonio Oliver    Number of children: 1   Years of education: Not on file   Highest education level: Bachelor's degree (e.g.,  BA, AB, BS)  Occupational History   Not on file  Tobacco Use   Smoking status: Never   Smokeless tobacco: Never  Vaping Use   Vaping Use: Never used  Substance and Sexual Activity   Alcohol use: Yes    Alcohol/week: 1.0 standard drink of alcohol    Types: 1 Glasses of wine per week    Comment: occ.   Drug use: No   Sexual activity: Not on file  Other Topics Concern   Not on file  Social History Narrative   Patient lives at home with spouse Antonio Oliver   Educ: BS   Occup: retired Art therapist.  Disabled due to depression and pain.   Tobacco: None   Alc: none   Right handed   Social  Determinants of Health   Financial Resource Strain: Not on file  Food Insecurity: Not on file  Transportation Needs: Not on file  Physical Activity: Not on file  Stress: Not on file  Social Connections: Not on file   Past Surgical History:  Procedure Laterality Date   ANTERIOR CERVICAL DECOMP/DISCECTOMY FUSION N/A 11/17/2018   Procedure: CERVICAL THREE-CERVICAL FOUR, CERVICAL FOUR-CERVICAL FIVE, CERVICAL FIVE-CERVICAL [place];  Surgeon: Earnie Larsson, MD;  Location: [facility];  Service: Neurosurgery;  Laterality: N/A;   AORTIC VALVE REPLACEMENT  [id number]/[id number]   APPLICATION OF WOUND VAC Left 04/05/2019   Procedure: Application Of Wound Vac;  Surgeon: Erle Crocker, MD;  Location: [facility];  Service: Orthopedics;  Laterality: Left;   BACK SURGERY     x4   CARDIOVASCULAR STRESS TEST  05/05/2021   lexiscan neg   FASCIOTOMY Left 04/05/2019   Left leg 2 compartment fasciotomy   FASCIOTOMY Left 04/05/2019   Procedure: FASCIOTOMY LEFT LOWER LEG;  Surgeon: Erle Crocker, MD;  Location: [facility];  Service: Orthopedics;  Laterality: Left;   FOOT NEUROMA SURGERY     GASTRIC BYPASS  2012   HEMATOMA EVACUATION Left 04/05/2019   Procedure: Evacuation Hematoma Left Lower Leg;  Surgeon: Erle Crocker, MD;  Location: [facility];  Service: Orthopedics;  Laterality: Left;   HEMORRHOID SURGERY     over 30 years ago   Hooks umb   I & D EXTREMITY Left 04/13/2022   Procedure: IRRIGATION AND DEBRIDEMENT OF ELBOW AND BURSECTOMY;  Surgeon: Iran Planas, MD;  Location: [facility];  Service: Orthopedics;  Laterality: Left;   LUMBAR LAMINECTOMY/DECOMPRESSION MICRODISCECTOMY N/A 01/08/2021   Procedure: CENTRAL LUMBAR LAMINECTOMY LUMBAR TWO-THREE, LUMBAR THREE-FOUR;  Surgeon: Jessy Oto, MD;  Location: [facility];  Service: Orthopedics;  Laterality: N/A;   NASAL SINUS SURGERY     x2   NECK SURGERY     OLECRANON BURSECTOMY  2023   d/t septic bursitis    TRANSTHORACIC ECHOCARDIOGRAM  12/2020   EF normal, mod MR, ascending AA (4.4 cm)   Past Medical History:  Diagnosis Date   Anxiety and depression    Ascending aorta dilation ([place]) 03/06/2019   4.3 cm 2022   Benign prostatic hyperplasia without lower urinary tract symptoms 11/17/2016   Last Assessment & Plan:  Stable PSA today.  Formatting of this note might be different from the original. Last Assessment & Plan:  Stable PSA today. Last Assessment & Plan:  Formatting of this note might be different from the original. Stable PSA today.   BPH (benign prostatic hyperplasia)    Bradycardia 02/01/2019   Cervical myelopathy ([place]) 11/14/2018   Charcot's  joint of foot 03/05/2019   Chronic back pain 04/03/2013   Chronic inflammatory arthritis 11/17/2016   History of positive rheumatoid factor in the past. Denies placement on immunosuppressive therapy   Chronic pain syndrome    Failed back surgical syndrome.  Chronic neck pain.  Chronic low back pain with sciatica bilateral   Colon cancer screening    2023 cologuard neg   Complete heart block (HCC)    Following AVR s/p St Jude PPM in 03/2017   DM2 (diabetes mellitus, type 2) ([place]) 04/03/2013   Last Assessment & Plan:  Formatting of this note might be different from the original. Diabetes is unchanged.  Continue current treatment regimen. Regular aerobic exercise. Diabetes will be reassessed in 3 months. Will check A1c today. Denies any problems with feet or sensation.   Elevated PSA 11/2021   Enthesopathy of ankle and tarsus 04/04/2013   Last Assessment & Plan:  Continues with swelling after prolonged standing.  Still  Wearing support hose, which helps.   Essential hypertension 10/30/2018   FHx: migraine headaches 04/04/2013   Fracture of capitate bone of wrist 02/26/2019   Fracture of tibia 03/05/2019   GERD (gastroesophageal reflux disease) 10/30/2018   Glaucoma 04/03/2013   Hemangioma of skin and subcutaneous tissue 07/07/2017   History of  aortic valve replacement 2018   bicuspid AV   History of kidney stones 11/17/2016   History of sleep apnea    wt loss->gone   Hypothyroidism 10/30/2018   IDA (iron deficiency anemia) 11/2021   malabs d/t gastric bipass   Lesion of plantar nerve 04/04/2013   Lumbar stenosis with neurogenic claudication 01/08/2021   Major depressive disorder, recurrent ([place])    ECT in the remote past.  Patient disabled due to recurrent depression and chronic pain.   Migraine syndrome    Mixed dyslipidemia 10/30/2018   Morbid obesity ([place]) 10/27/2011   Nondisplaced fracture of medial cuneiform of left foot, initial encounter for closed fracture 11/16/2018   Osteoarthritis 10/30/2018   Personal history of tobacco use, presenting hazards to health 04/04/2013   Presence of permanent cardiac pacemaker    Severe single current episode of major depressive disorder, without psychotic features ([place]) 11/17/2016   Status post bariatric surgery 04/05/2011   bipass surg --presurg wt 310 lbs   Syncope 02/14/2019   Vitamin D deficiency 10/30/2018   Ht '6\' 3"'$  (1.905 m)   BMI 24.60 kg/m   Opioid Risk Score:   Fall Risk Score:  `1  Depression screen PHQ 2/9      No data to display            Review of Systems  Musculoskeletal:  Positive for back pain and neck pain.       B/L shoulder pain  All other systems reviewed and are negative.     Objective:   Physical Exam  General: Alert and oriented x 3, No apparent distress HEENT: Head is normocephalic, atraumatic, PERRLA, EOMI, sclera anicteric, oral mucosa pink and moist, dentition intact, ext ear canals clear,  Neck: Supple without JVD or lymphadenopathy Heart: Reg rate and rhythm. No murmurs rubs or gallops Chest: CTA bilaterally without wheezes, rales, or rhonchi; no distress Abdomen: Soft, non-tender, non-distended, bowel sounds positive. Extremities: No clubbing, cyanosis, or edema. Pulses are 2+ Psych: Pt's affect is appropriate. Pt is  cooperative Skin: Clean and intact without signs of breakdown Neuro:  Alert and oriented x 3. Fair insight and awareness.Some STM deficits. Normal language and speech. Cranial nerve exam  unremarkable. RUE 4-/5 prox to 3+ to 4-/5 distally. LUE 4 to 4+/5 prox to distal. A bit weaker with grip in both uppers. Decreased LT in right hand > right. LE's 4/5. DTR's 1+.  Musculoskeletal: Head leans to right and is rotated to left, flexed slightly forward. Can come back somewhat to neutral with passive stretching. Right SCM is quite taut and to a lesser extent the right upper trapezius and levator scapulae. Left SCM has some tension but this seems to be from pull of neck. Neck is generally tender to palpation and PROM. General cervical kyphosis noted.        Assessment & Plan:  Cervical dystonia Hx of cervical spondylosis/ post-laminectomy syndrome with most recent surgery in March of 2020. He is fused from C3-6. Pt with chronic pain as a result. This is managed by Dr. Truman Hayward Chronic migraines managed by Dr. Tomi Likens   Plan: Will set patient up for 500 units of dysport to right sternocleidomastoid, trapezius, and ? Levator scapulae Will need oiutpt physical therapy after injections to stretch these injected areas, for any appropriate modalities, and to address posture and HEP.  I think we can help him quite a bit, but given his cervical issues/surgery and the chronicity of this problem, he will likely have lingering posture deficits and pain.   Forty-five minutes of face to face patient care time were spent during this visit. All questions were encouraged and answered. Follow up with me in about 4-6 weeks for dysport injections.

## 2022-08-25 NOTE — Patient Instructions (Signed)
ALWAYS FEEL FREE TO CALL OUR OFFICE WITH ANY PROBLEMS OR QUESTIONS ([phone])  **PLEASE NOTE** ALL MEDICATION REFILL REQUESTS (INCLUDING CONTROLLED SUBSTANCES) NEED TO BE MADE AT LEAST 7 DAYS PRIOR TO REFILL BEING DUE. ANY REFILL REQUESTS INSIDE THAT TIME FRAME MAY RESULT IN DELAYS IN RECEIVING YOUR PRESCRIPTION.                    

## 2022-09-02 NOTE — Progress Notes (Signed)
Remote pacemaker transmission.   

## 2022-09-13 NOTE — Progress Notes (Addendum)
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  Testing is unremarkable for idiopathic/neurodegenerative parkinsonism   1.  advised patient to consider discussing with his prescribing provider about tapering off of Rexulti.  2.  Follow up in August for migraine.   Total time spent face to face with patient and reviewing lab result:  11 minutes.   Subjective:  Antonio Oliver is a 67 year old male with complete heart block s/p PPM, possible rheumatoid arthritis, diabetes, depression/anxiety, chronic back pain and history of cervical myelopathy follows up for black out spells.  He is accompanied by his wife who supplements history.   UPDATE: Due to pain, he was unable to tolerate lying down for DaT scan.  Skin biopsy negative for P-SYN  HISTORY: He reports multiple episodes of his legs just giving out.  Chronic dizziness for about a year.  He describes spinning.  Sometimes he feels like he is going to pass out when he stands up.  He has orthostatic hypotension at times.  His cardiologist said it is not his heart.  He is wearing compression stockings.  Denies double vision.  Notes occasional tremor in hands.  No history of REM sleep behavior.  PAST MEDICAL HISTORY: Past Medical History:  Diagnosis Date   Anxiety and depression    Ascending aorta dilation (HCC) 03/06/2019   4.3 cm 2022   Benign prostatic hyperplasia without lower urinary tract symptoms 11/17/2016   Last Assessment & Plan:  Stable PSA today.  Formatting of this note might be different from the original. Last Assessment & Plan:  Stable PSA today. Last Assessment & Plan:  Formatting of this note might be different from the original. Stable PSA today.   BPH (benign prostatic hyperplasia)    Bradycardia 02/01/2019   Cervical myelopathy (HCC) 11/14/2018   Charcot's joint of foot 03/05/2019   Chronic  back pain 04/03/2013   Chronic inflammatory arthritis 11/17/2016   History of positive rheumatoid factor in the past. Denies placement on immunosuppressive therapy   Chronic pain syndrome    Failed back surgical syndrome.  Chronic neck pain.  Chronic low back pain with sciatica bilateral   Colon cancer screening    2023 cologuard neg   Complete heart block (HCC)    Following AVR s/p St Jude PPM in 03/2017   DM2 (diabetes mellitus, type 2) (HCC) 04/03/2013   Last Assessment & Plan:  Formatting of this note might be different from the original. Diabetes is unchanged.  Continue current treatment regimen. Regular aerobic exercise. Diabetes will be reassessed in 3 months. Will check A1c today. Denies any problems with feet or sensation.   Elevated PSA 11/2021   Enthesopathy of ankle and tarsus 04/04/2013   Last Assessment & Plan:  Continues with swelling after prolonged standing.  Still  Wearing support hose, which helps.   Essential hypertension 10/30/2018   FHx: migraine headaches 04/04/2013   Fracture of capitate bone of wrist 02/26/2019   Fracture of tibia 03/05/2019   GERD (gastroesophageal reflux disease) 10/30/2018   Glaucoma 04/03/2013   Hemangioma of skin and subcutaneous tissue 07/07/2017   History of aortic valve replacement 2018   bicuspid AV   History of kidney stones 11/17/2016   History of sleep apnea    wt loss->gone   Hypothyroidism 10/30/2018   IDA (iron deficiency anemia) 11/2021   malabs d/t gastric bipass   Lesion of  plantar nerve 04/04/2013   Lumbar stenosis with neurogenic claudication 01/08/2021   Major depressive disorder, recurrent (HCC)    ECT in the remote past.  Patient disabled due to recurrent depression and chronic pain.   Migraine syndrome    Mixed dyslipidemia 10/30/2018   Morbid obesity (HCC) 10/27/2011   Nondisplaced fracture of medial cuneiform of left foot, initial encounter for closed fracture 11/16/2018   Osteoarthritis 10/30/2018   Personal  history of tobacco use, presenting hazards to health 04/04/2013   Presence of permanent cardiac pacemaker    Severe single current episode of major depressive disorder, without psychotic features ([facility]CC) 11/17/2016   Status post bariatric surgery 04/05/2011   bipass surg --presurg wt 310 lbs   Syncope 02/14/2019   Vitamin D deficiency 10/30/2018    MEDICATIONS: Current Outpatient Medications on File Prior to Visit  Medication Sig Dispense Refill   aspirin EC 81 MG tablet Take 81 mg by mouth daily.     atorvastatin (LIPITOR) 20 MG tablet Take 20 mg by mouth daily at 6 PM.      brexpiprazole (REXULTI) 2 MG TABS tablet Take 2 mg by mouth daily.      buPROPion (WELLBUTRIN SR) 200 MG 12 hr tablet Take 200 mg by mouth 2 (two) times daily.     busPIRone (BUSPAR) 10 MG tablet Take 20 mg by mouth 2 (two) times daily.      CALCIUM CITRATE PO Take 500 mg by mouth 2 (two) times daily.     Cholecalciferol (VITAMIN D-3 PO) Take 1 capsule by mouth daily.     clonazePAM (KLONOPIN) 0.5 MG tablet Take 0.5 mg by mouth 2 (two) times daily as needed for anxiety.     diclofenac Sodium (VOLTAREN) 1 % GEL Apply 1 application topically 4 (four) times daily.     docusate sodium (COLACE) 100 MG capsule Take 1 capsule (100 mg total) by mouth 2 (two) times daily. 10 capsule 0   DULoxetine (CYMBALTA) 60 MG capsule Take 60 mg by mouth 2 (two) times daily.      ferrous sulfate 325 (65 FE) MG EC tablet Take 325 mg by mouth daily.     finasteride (PROSCAR) 5 MG tablet Take 5 mg by mouth daily.     fluticasone (FLONASE) 50 MCG/ACT nasal spray Place 2 sprays into the nose daily as needed for allergies or rhinitis.     folic acid (FOLVITE) 1 MG tablet Take 1 mg by mouth daily.     furosemide (LASIX) 20 MG tablet Take 1 tablet (20 mg total) by mouth as needed (for leg swelling). 30 tablet 4   Galcanezumab-gnlm (EMGALITY) 120 MG/ML SOAJ Inject 120 mg into the skin every 28 (twenty-eight) days. 1 mL 11   hydrALAZINE (APRESOLINE)  25 MG tablet Take 0.5 tablets (12.5 mg total) by mouth 2 (two) times daily as needed. 90 tablet 3   levothyroxine (SYNTHROID) 125 MCG tablet Take 125 mcg by mouth daily.      loratadine (CLARITIN) 10 MG tablet Take 10 mg by mouth daily as needed for allergies.     MAGNESIUM PO Take 1 tablet by mouth daily.     meclizine (ANTIVERT) 25 MG tablet Take 25 mg by mouth 3 (three) times daily as needed for dizziness.     metFORMIN (GLUCOPHAGE) 1000 MG tablet Take 1,000 mg by mouth 2 (two) times daily with a meal.     metoprolol succinate (TOPROL-XL) 50 MG 24 hr tablet Take 25 mg by mouth 2 (two)  times daily.     mirtazapine (REMERON) 30 MG tablet Take 30 mg by mouth at bedtime.      Multiple Vitamins-Minerals (MULTIVITAMIN WITH MINERALS) tablet Take 1 tablet by mouth daily.     Multiple Vitamins-Minerals (PRESERVISION/LUTEIN) CAPS Take 1 capsule by mouth at bedtime.     mupirocin ointment (BACTROBAN) 2 % Apply 1 application topically 3 (three) times daily as needed (infection).     naloxone (NARCAN) nasal spray 4 mg/0.1 mL Instill or spray into the nose if you are experiencing shortness of breath or signs of opioid overdose. 1 each 0   omeprazole (PRILOSEC) 20 MG capsule Take 20 mg by mouth 2 (two) times daily before a meal.      oxyCODONE-acetaminophen (PERCOCET) 10-325 MG tablet Take 0.5 tablets by mouth See admin instructions. 1/2 tablet every 3 hours.     polyethylene glycol (MIRALAX / GLYCOLAX) 17 g packet Take 17 g by mouth daily. 14 each 0   potassium chloride SA (KLOR-CON M) 20 MEQ tablet Take 20 mEq by mouth daily as needed (with lasix use).     pregabalin (LYRICA) 100 MG capsule Take 100 mg by mouth 3 (three) times daily.     Semaglutide (OZEMPIC, 0.25 OR 0.5 MG/DOSE, [place]) Inject 0.5 mg into the skin every Monday.     Semaglutide,0.25 or 0.5MG /DOS, (OZEMPIC, 0.25 OR 0.5 MG/DOSE,) 2 MG/3ML SOPN Inject 0.5 mg into the skin once a week. 27 mL 3   silodosin (RAPAFLO) 8 MG CAPS capsule Take 8 mg by  mouth daily.     solifenacin (VESICARE) 10 MG tablet Take 10 mg by mouth at bedtime.     tiZANidine (ZANAFLEX) 4 MG tablet Take 4 mg by mouth 3 (three) times daily.     traMADol (ULTRAM) 50 MG tablet Take 50 mg by mouth See admin instructions. 25 mg every 3 hours     Ubrogepant (UBRELVY) 100 MG TABS Take 1 tablet by mouth as needed (May repeat in 2 hours.  Maximum 2 tablets in 24 hours.). 16 tablet 11   No current facility-administered medications on file prior to visit.    ALLERGIES: Allergies  Allergen Reactions   Calcium Carbonate Other (See Comments)    Pt had gastric bypass, can not absorb calcium carbonate - able to absorb calcium citrate   Ms Contin [Morphine] Itching    Can take with Benadryl, facial and nose itching     FAMILY HISTORY: Family History  Problem Relation Age of Onset   Cancer Mother        breast   Rheum arthritis Mother    Diabetes Mother    Heart disease Father    Kidney disease Father    Diabetes Father    Hypertension Brother    Diabetes Brother    Osteoarthritis Brother    Heart disease Brother    Hypertension Brother    Diabetes Brother    Rheum arthritis Brother    Depression Daughter    Anxiety disorder Daughter       Objective:  Blood pressure (!) 154/93, height 6\' 3"  (1.905 m), weight 200 lb (90.7 kg). General: No acute distress.  Patient appears well-groomed.      Shon Millet, DO  CC: Simone Curia, MD

## 2022-09-14 ENCOUNTER — Other Ambulatory Visit (HOSPITAL_COMMUNITY): Payer: Self-pay

## 2022-09-14 ENCOUNTER — Ambulatory Visit: Payer: Medicare PPO | Admitting: Neurology

## 2022-09-14 ENCOUNTER — Encounter: Payer: Self-pay | Admitting: Neurology

## 2022-09-14 VITALS — BP 154/93 | Ht 75.0 in | Wt 200.0 lb

## 2022-09-14 DIAGNOSIS — I951 Orthostatic hypotension: Secondary | ICD-10-CM

## 2022-09-14 DIAGNOSIS — G2111 Neuroleptic induced parkinsonism: Secondary | ICD-10-CM | POA: Diagnosis not present

## 2022-09-14 DIAGNOSIS — T43505A Adverse effect of unspecified antipsychotics and neuroleptics, initial encounter: Secondary | ICD-10-CM

## 2022-09-21 ENCOUNTER — Other Ambulatory Visit (HOSPITAL_COMMUNITY): Payer: Self-pay

## 2022-09-22 ENCOUNTER — Encounter: Payer: Self-pay | Admitting: Physical Medicine & Rehabilitation

## 2022-09-22 ENCOUNTER — Encounter: Payer: Medicare PPO | Attending: Physical Medicine & Rehabilitation | Admitting: Physical Medicine & Rehabilitation

## 2022-09-22 VITALS — BP 131/82 | HR 62 | Ht 75.0 in | Wt 193.0 lb

## 2022-09-22 DIAGNOSIS — G243 Spasmodic torticollis: Secondary | ICD-10-CM | POA: Diagnosis not present

## 2022-09-22 MED ORDER — ABOBOTULINUMTOXINA 500 UNITS IM SOLR
500.0000 [IU] | Freq: Once | INTRAMUSCULAR | Status: AC
  Administered 2022-09-22: 500 [IU] via INTRAMUSCULAR

## 2022-09-22 NOTE — Progress Notes (Signed)
Dysport Injection for spasticity using needle EMG guidance Indication:  Cervical dystonia     Dilution: 500 Units/23m        Total Units Injected: 500 Indication: Severe spasticity which interferes with ADL,mobility and/or  hygiene and is unresponsive to medication management and other conservative care Informed consent was obtained after describing risks and benefits of the procedure with the patient. This includes bleeding, bruising, infection, excessive weakness, or medication side effects. A REMS form is on file and signed.   Side: right  Needle: 547minjectable monopolar needle electrode   Number of units per muscle Trapezius 75 units 2 access points Levator scapulae 25 units  1  access points Sternocleidomastoid 400 units 4 access points Scalenes 0 units 0 access points     All injections were done after obtaining appropriate EMG activity and after negative drawback for blood. The patient tolerated the procedure well. Post procedure instructions were given.

## 2022-09-22 NOTE — Patient Instructions (Signed)
ALWAYS FEEL FREE TO CALL OUR OFFICE WITH ANY PROBLEMS OR QUESTIONS ([phone])  **PLEASE NOTE** ALL MEDICATION REFILL REQUESTS (INCLUDING CONTROLLED SUBSTANCES) NEED TO BE MADE AT LEAST 7 DAYS PRIOR TO REFILL BEING DUE. ANY REFILL REQUESTS INSIDE THAT TIME FRAME MAY RESULT IN DELAYS IN RECEIVING YOUR PRESCRIPTION.                    

## 2022-09-25 IMAGING — CT CT ANGIO CHEST
3 of 11 series · 18 of 46 positions shown · IV contrast (agent unspecified)
Comparison: 12/10/2020.  02/14/2019.

CLINICAL DATA: Aortic aneurysm, known or suspected.

EXAM:
CT ANGIOGRAPHY CHEST WITH CONTRAST
TECHNIQUE: Multidetector CT imaging of the chest was performed using the
standard protocol during bolus administration of intravenous
contrast. Multiplanar CT image reconstructions and MIPs were
obtained to evaluate the vascular anatomy.

[Series 7: arterial · axial · arterial · 0.85mm/px · z∈[-348,-50]mm · 11 of 179 slices shown]
[im 15/179  lung]
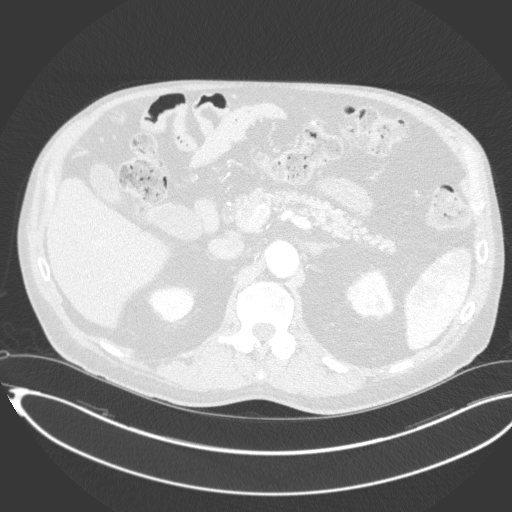
[im 30/179  soft-tissue]
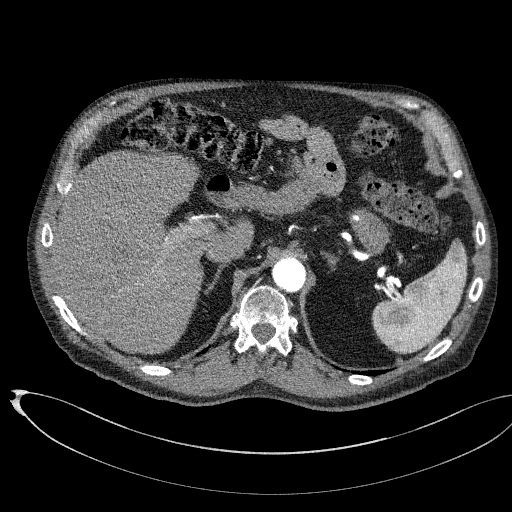
[im 45/179  lung]
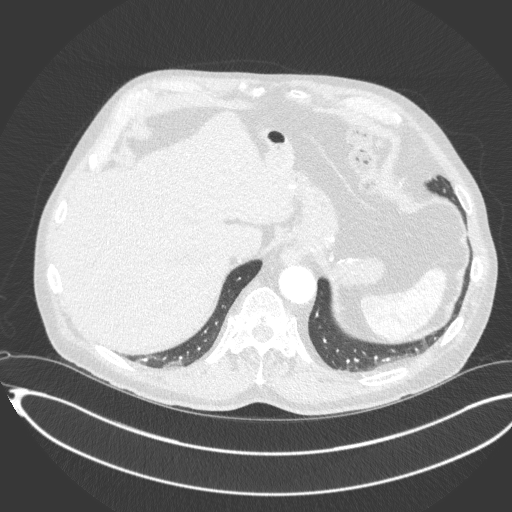
[im 60/179  soft-tissue]
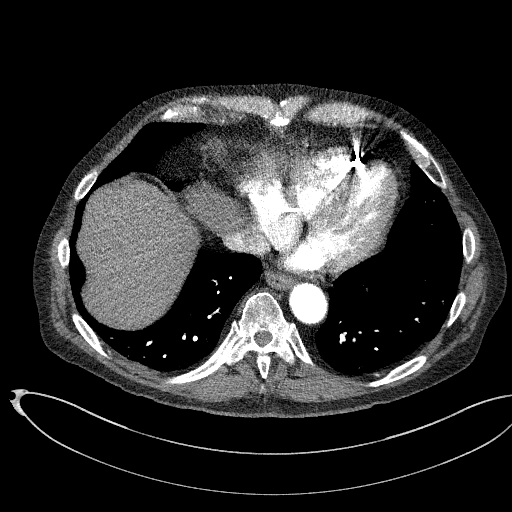
[im 75/179  lung]
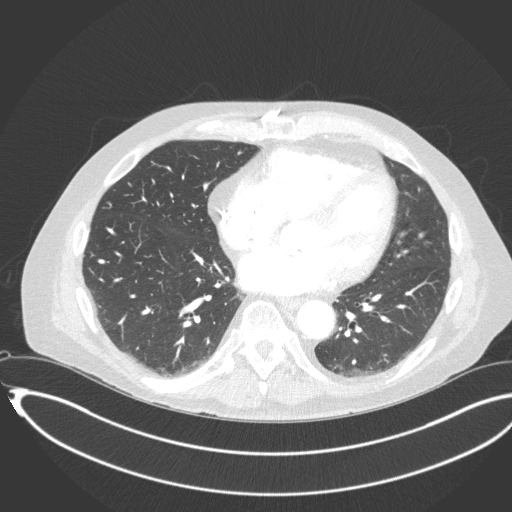
[im 90/179  soft-tissue]
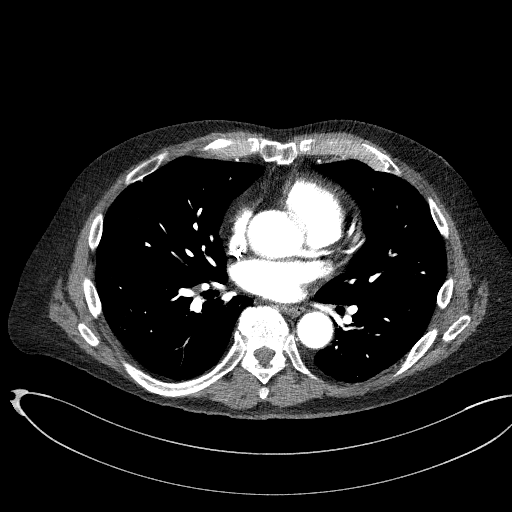
[im 104/179  lung]
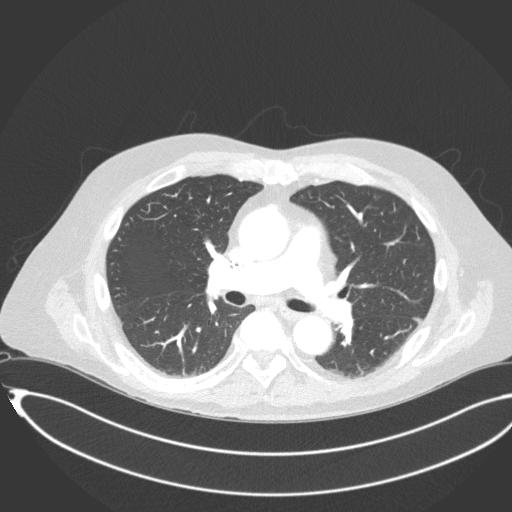
[im 119/179  soft-tissue]
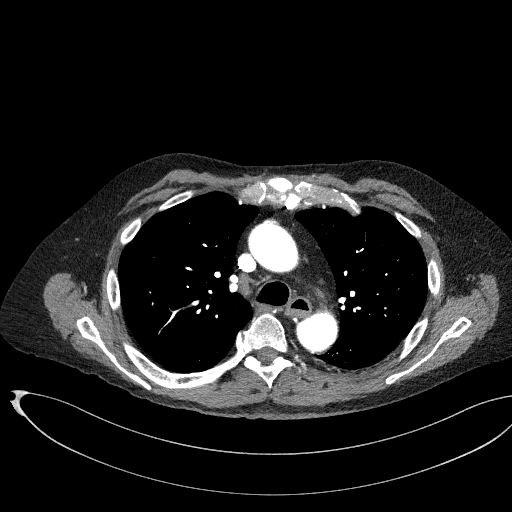
[im 134/179  lung]
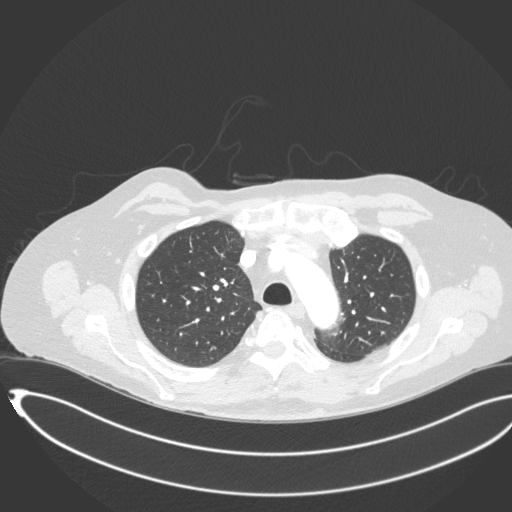
[im 149/179  soft-tissue]
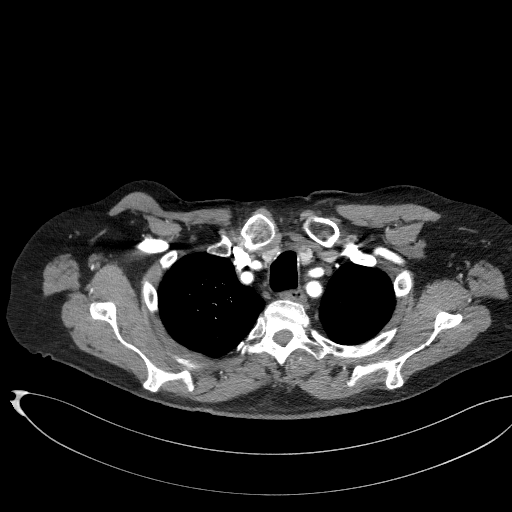
[im 164/179  lung]
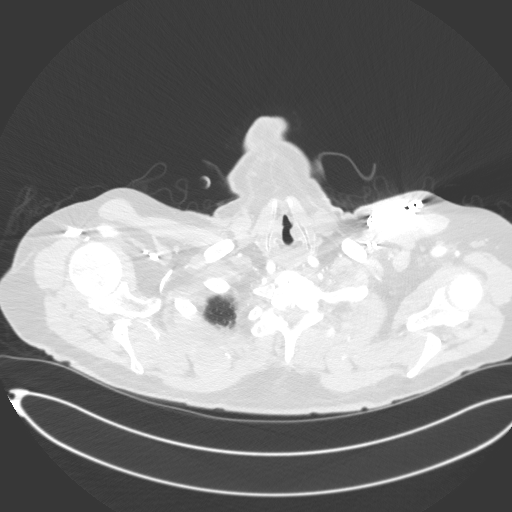

[Series 8: lung · axial · 0.85mm/px · z∈[-350,-212]mm · 4 of 179 slices shown]
[im 14/179  soft-tissue]
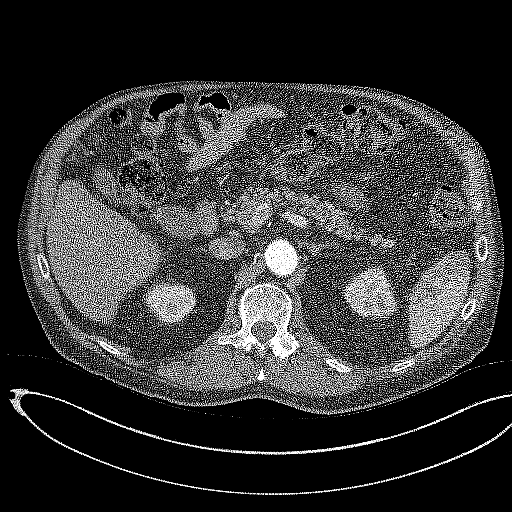
[im 42/179  soft-tissue]
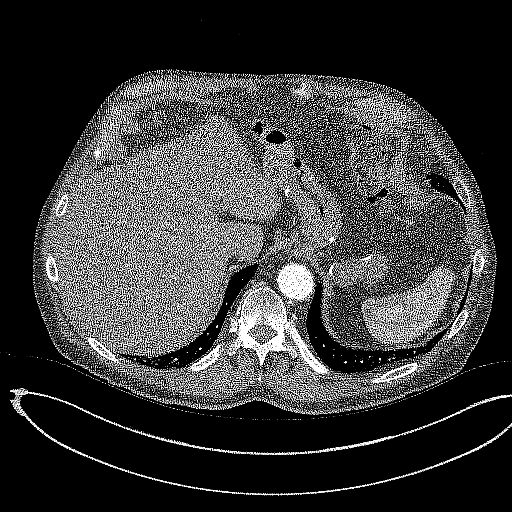
[im 55/179  soft-tissue]
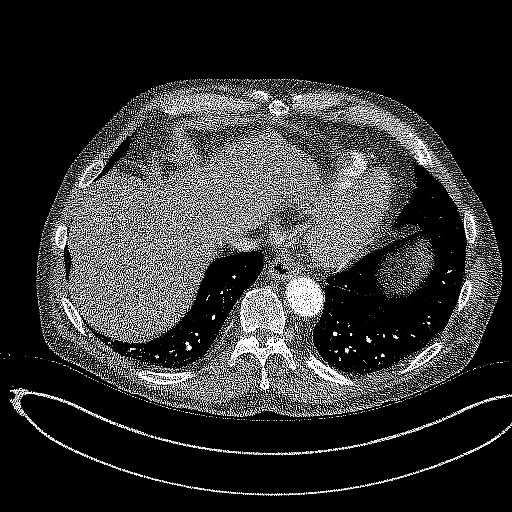
[im 83/179  soft-tissue]
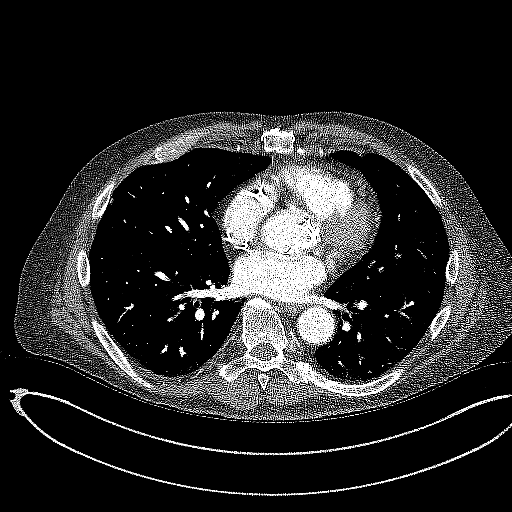

[Series 9: coronals · coronal · 0.72mm/px · 3 of 131 slices shown]
[im 33/131  soft-tissue]
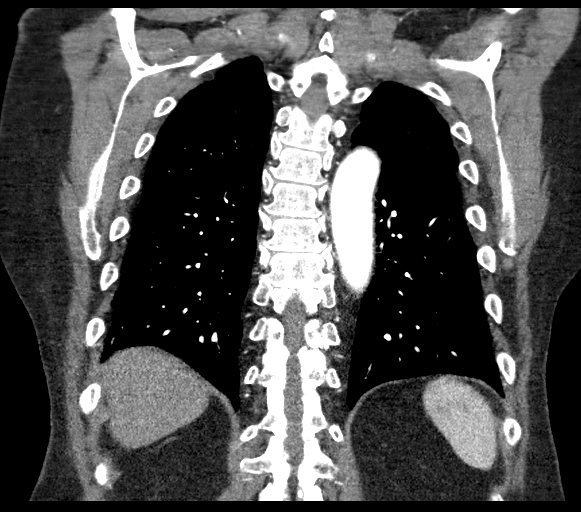
[im 66/131  soft-tissue]
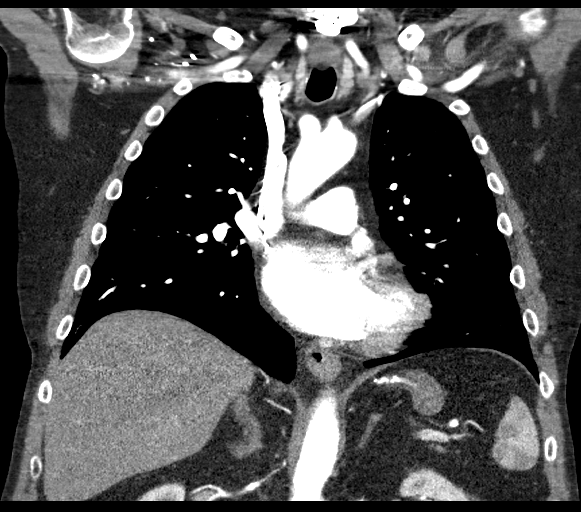
[im 98/131  soft-tissue]
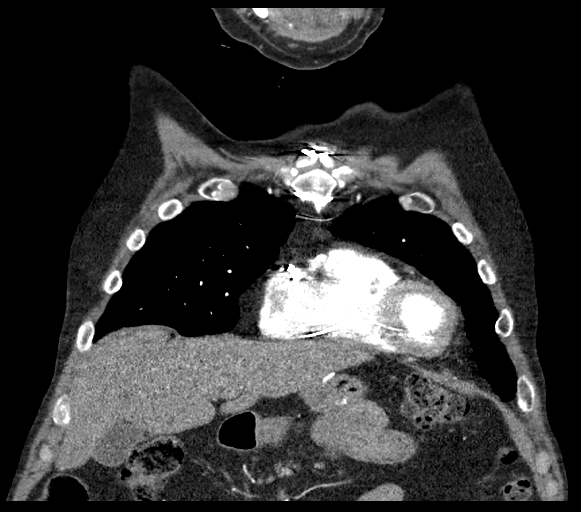

[18 of 46 positions shown; findings below may reference images not displayed]

RADIATION DOSE REDUCTION: This exam was performed according to the
departmental dose-optimization program which includes automated
exposure control, adjustment of the mA and/or kV according to
patient size and/or use of iterative reconstruction technique.

CONTRAST:  100mL OMNIPAQUE IOHEXOL 350 MG/ML SOLN
FINDINGS: Cardiovascular: Previous aortic valve replacement. Pacemaker in
place. No pericardial effusion. Maximal diameter of the ascending
aorta remains 4.3 cm. Pulmonary artery branches appear normal.

Mediastinum/Nodes: No mass or lymphadenopathy.

Lungs/Pleura: Lungs are clear except for mild scarring and a few
tiny subpleural nodules no larger than 3 mm, not requiring further
follow-up. No pleural effusion.

Upper Abdomen: Previous gastric surgery. No acute upper abdominal
finding.

Musculoskeletal: Ordinary thoracic degenerative changes are noted.
Benign appearing sclerotic foci as seen previously. Scoliotic
curvature.

Review of the MIP images confirms the above findings.
IMPRESSION: Stable appearance of the ascending aorta, maximal diameter 4.3 cm.
Recommend annual imaging followup by CTA or MRA. This recommendation
follows 4666 ACCF/AHA/AATS/ACR/ASA/SCA/TONAH/YUGOV/PARDO ARIZA/EBADAT Guidelines
for the Diagnosis and Management of Patients with Thoracic Aortic
Disease. Circulation. 4666; 121: E266-e369. Aortic aneurysm NOS
([id number].5)

No change in scattered small subpleural nodules no larger than 3 mm.
No follow-up necessary.

## 2022-09-29 ENCOUNTER — Encounter: Payer: Medicare PPO | Admitting: Physical Medicine & Rehabilitation

## 2022-10-05 NOTE — Therapy (Signed)
OUTPATIENT PHYSICAL THERAPY NEURO EVALUATION   Patient Name: Antonio Oliver MRN: [id number] DOB:10/07/1955, 67 y.o., male Today's Date: 10/06/2022   PCP: Cher Nakai, MD  REFERRING PROVIDER: Meredith Staggers, MD  END OF SESSION:  PT End of Session - 10/06/22 1106     Visit Number 1    Number of Visits 13    Date for PT Re-Evaluation 11/17/22    Authorization Type [employer]    PT Start Time [phone]    PT Stop Time 1018    PT Time Calculation (min) 44 min    Activity Tolerance Patient tolerated treatment well    Behavior During Therapy [facility] for tasks assessed/performed             Past Medical History:  Diagnosis Date   Anxiety and depression    Ascending aorta dilation ([place]) 03/06/2019   4.3 cm 2022   Benign prostatic hyperplasia without lower urinary tract symptoms 11/17/2016   Last Assessment & Plan:  Stable PSA today.  Formatting of this note might be different from the original. Last Assessment & Plan:  Stable PSA today. Last Assessment & Plan:  Formatting of this note might be different from the original. Stable PSA today.   BPH (benign prostatic hyperplasia)    Bradycardia 02/01/2019   Cervical myelopathy ([place]) 11/14/2018   Charcot's joint of foot 03/05/2019   Chronic back pain 04/03/2013   Chronic inflammatory arthritis 11/17/2016   History of positive rheumatoid factor in the past. Denies placement on immunosuppressive therapy   Chronic pain syndrome    Failed back surgical syndrome.  Chronic neck pain.  Chronic low back pain with sciatica bilateral   Colon cancer screening    2023 cologuard neg   Complete heart block (HCC)    Following AVR s/p St Jude PPM in 03/2017   DM2 (diabetes mellitus, type 2) ([place]) 04/03/2013   Last Assessment & Plan:  Formatting of this note might be different from the original. Diabetes is unchanged.  Continue current treatment regimen. Regular aerobic exercise. Diabetes will be reassessed in 3 months. Will check A1c today. Denies  any problems with feet or sensation.   Elevated PSA 11/2021   Enthesopathy of ankle and tarsus 04/04/2013   Last Assessment & Plan:  Continues with swelling after prolonged standing.  Still  Wearing support hose, which helps.   Essential hypertension 10/30/2018   FHx: migraine headaches 04/04/2013   Fracture of capitate bone of wrist 02/26/2019   Fracture of tibia 03/05/2019   GERD (gastroesophageal reflux disease) 10/30/2018   Glaucoma 04/03/2013   Hemangioma of skin and subcutaneous tissue 07/07/2017   History of aortic valve replacement 2018   bicuspid AV   History of kidney stones 11/17/2016   History of sleep apnea    wt loss->gone   Hypothyroidism 10/30/2018   IDA (iron deficiency anemia) 11/2021   malabs d/t gastric bipass   Lesion of plantar nerve 04/04/2013   Lumbar stenosis with neurogenic claudication 01/08/2021   Major depressive disorder, recurrent ([place])    ECT in the remote past.  Patient disabled due to recurrent depression and chronic pain.   Migraine syndrome    Mixed dyslipidemia 10/30/2018   Morbid obesity ([place]) 10/27/2011   Nondisplaced fracture of medial cuneiform of left foot, initial encounter for closed fracture 11/16/2018   Osteoarthritis 10/30/2018   Personal history of tobacco use, presenting hazards to health 04/04/2013   Presence of permanent cardiac pacemaker    Severe single current episode of  major depressive disorder, without psychotic features ([place]) 11/17/2016   Status post bariatric surgery 04/05/2011   bipass surg --presurg wt 310 lbs   Syncope 02/14/2019   Vitamin D deficiency 10/30/2018   Past Surgical History:  Procedure Laterality Date   ANTERIOR CERVICAL DECOMP/DISCECTOMY FUSION N/A 11/17/2018   Procedure: CERVICAL THREE-CERVICAL FOUR, CERVICAL FOUR-CERVICAL FIVE, CERVICAL FIVE-CERVICAL SIX ANTERIOR CERVICAL DECOMPRESSION/DISCECTOMY FUSION;  Surgeon: Earnie Larsson, MD;  Location: [place];  Service: Neurosurgery;  Laterality: N/A;   AORTIC  VALVE REPLACEMENT  [id number]/6948   APPLICATION OF WOUND VAC Left 04/05/2019   Procedure: Application Of Wound Vac;  Surgeon: Erle Crocker, MD;  Location: [place];  Service: Orthopedics;  Laterality: Left;   BACK SURGERY     x4   CARDIOVASCULAR STRESS TEST  05/05/2021   lexiscan neg   FASCIOTOMY Left 04/05/2019   Left leg 2 compartment fasciotomy   FASCIOTOMY Left 04/05/2019   Procedure: FASCIOTOMY LEFT LOWER LEG;  Surgeon: Erle Crocker, MD;  Location: [place];  Service: Orthopedics;  Laterality: Left;   FOOT NEUROMA SURGERY     GASTRIC BYPASS  2012   HEMATOMA EVACUATION Left 04/05/2019   Procedure: Evacuation Hematoma Left Lower Leg;  Surgeon: Erle Crocker, MD;  Location: [place];  Service: Orthopedics;  Laterality: Left;   HEMORRHOID SURGERY     over 30 years ago   [place] umb   I & D EXTREMITY Left 04/13/2022   Procedure: IRRIGATION AND DEBRIDEMENT OF ELBOW AND BURSECTOMY;  Surgeon: Iran Planas, MD;  Location: [place];  Service: Orthopedics;  Laterality: Left;   LUMBAR LAMINECTOMY/DECOMPRESSION MICRODISCECTOMY N/A 01/08/2021   Procedure: CENTRAL LUMBAR LAMINECTOMY LUMBAR TWO-THREE, LUMBAR THREE-FOUR;  Surgeon: Jessy Oto, MD;  Location: [place];  Service: Orthopedics;  Laterality: N/A;   NASAL SINUS SURGERY     x2   NECK SURGERY     OLECRANON BURSECTOMY  2023   d/t septic bursitis   TRANSTHORACIC ECHOCARDIOGRAM  12/2020   EF normal, mod MR, ascending AA (4.4 cm)   Patient Active Problem List   Diagnosis Date Noted   Cervical dystonia 08/25/2022   Postlaminectomy syndrome, cervical region 08/25/2022   Septic bursitis 04/16/2022   Right bundle branch block 04/15/2022   Orthostasis 04/15/2022   History of aortic stenosis 04/13/2022   Septic bursitis of elbow 04/12/2022   Diabetes mellitus type 2 in nonobese ([place]) 04/16/2021   Lumbar stenosis with neurogenic claudication 01/08/2021   Back pain 01/05/2021   BPH (benign prostatic hyperplasia)     Anxiety and depression    Hypertension associated with diabetes ([place])    Presence of permanent cardiac pacemaker 12/28/2019   Ascending aorta dilation ([place]) 03/06/2019   Cervical myelopathy ([place]) 11/14/2018   Hyperlipidemia associated with type 2 diabetes mellitus ([place]) 10/30/2018   Vitamin D deficiency 10/30/2018   Osteoarthritis 10/30/2018   GERD (gastroesophageal reflux disease) 10/30/2018   Hypothyroidism 10/30/2018   Essential hypertension 10/30/2018   Mixed dyslipidemia 10/30/2018   Hemangioma of skin and subcutaneous tissue 07/07/2017   Lentigo 07/07/2017   Multiple benign melanocytic nevi 07/07/2017   Complete heart block ([place]) 05/04/2017   S/P AVR (aortic valve replacement) 03/10/2017   Bicuspid aortic valve 12/22/2016   Benign prostatic hyperplasia without lower urinary tract symptoms 11/17/2016   Chronic inflammatory arthritis 11/17/2016   Chronic bilateral low back pain 11/17/2016   Heart murmur 11/17/2016   History of kidney stones 11/17/2016   Anxiety 06/20/2013   Personal  history of tobacco use, presenting hazards to health 04/04/2013   Neck pain 04/04/2013   Status post bariatric surgery 04/04/2013   FHx: migraine headaches 04/04/2013   DM2 (diabetes mellitus, type 2) ([place]) 04/03/2013   Chronic pain 04/03/2013   Glaucoma 04/03/2013   Morbid obesity ([place]) 10/27/2011    ONSET DATE: several years   REFERRING DIAG: G24.3 (ICD-10-CM) - Cervical dystonia  THERAPY DIAG:  Abnormal posture  Cervicalgia  Muscle weakness (generalized)  Dizziness and giddiness  Unsteadiness on feet  Rationale for Evaluation and Treatment: Rehabilitation  SUBJECTIVE:                                                                                                                                                                                             SUBJECTIVE STATEMENT: Patient reports that he has had PT for the full body but is not interested in that- wants PT for his neck  only. Had his last neck surgery in 2020, and things with his neck have been worse since then. Pain meds are no longer keeping pain tolerable anymore. Pain occurs over the top of the B shoulders radiating down the shoulder blade and into the mid arm. Denies N/T. Worse with ROM past a certain point. Painful to shave, wash hair. R hand dominant but reports that he has a bad wrist on this side. Has had 1 Dysport injection- next one will be in 3 months because of busy MD schedule. Wife reports hx of good benefit from DN. Admits to multiple falls; turns will cause him to fall. Reports dizzy spells which also contribute- occur 3x a week. Getting migraines- on meds for this. Wife reports significant BP fluctuations.   Pt accompanied by: significant other  PERTINENT HISTORY: Pacemaker, anxiety and depression, bradycardia, cervical myelopathy, charcot's foot, DMII, HTN, migraines, HLD, C3-6, ACDF 2020, L LE fasciotomy 2020, L2-4 laminectomy 2022; pt reports "bad wrist" on R side from cyst that has been removed and has come back  PAIN:  Are you having pain? Yes: NPRS scale: 8/10 Pain location: R shoulders and down the arm Pain description: throbbing Aggravating factors: movement Relieving factors: pain meds, lidoderm patches  PRECAUTIONS: ICD/Pacemaker  WEIGHT BEARING RESTRICTIONS: No  FALLS: Has patient fallen in last 6 months? Yes. Number of falls wife reports multiple falls  LIVING ENVIRONMENT: Lives with: lives with their spouse Lives in: House/apartment; condo Stairs:  1 step to enter Has following equipment at home: Single point cane, Environmental consultant - 2 wheeled, Wheelchair (manual), Electronics engineer, and Grab bars  PLOF: Independent with basic ADLs  PATIENT GOALS: improve neck pain  OBJECTIVE:   DIAGNOSTIC  FINDINGS: 06/22/22 cervical MRI: Stable postoperative changes from C3-C6 with anterior and interbody fusion hardware.  Stable degenerative subluxation at C6-7 with a bulging uncovered Disc. Small  foci of increased T2 signal intensity in the spinal cord at C3-4 suggesting chronic ischemic changes/myelomalacia  COGNITION: Overall cognitive status: Within functional limits for tasks assessed   SENSATION: Diminished sensation in R arm and 5th digit, R thigh and foot  COORDINATION: Alternating pronation/supination: slow B Alternating toe tap: WNL B Finger to nose: WNL B   POSTURE: resting in R cervical sidebending and neck flexion; rounded shoulders and slight thoracic kyphosis  Resting in 17 deg flexion, 15 deg R sidebending   CERVICAL ROM:   Active ROM A/PROM (deg) eval  Flexion 45 *pain  Extension 4 *pain  Right lateral flexion 28 *pain  Left lateral flexion -10 *pain  Right rotation 37 * pain  Left rotation 40 *pain   (Blank rows = not tested)   CERVICAL MMT:   MMT /5  Flexion 3-  Extension 3-  Right lateral flexion 3-  Left lateral flexion 3-  Right rotation 3-  Left rotation 3-   (Blank rows = not tested)  UPPER EXTREMITY MMT:  MMT Right eval Left eval  Shoulder flexion 4- 4-  Shoulder extension    Shoulder abduction 3+ 4  Shoulder adduction    Shoulder extension    Shoulder internal rotation 3+ 3+  Shoulder external rotation 3+ 3+  Middle trapezius    Lower trapezius    Elbow flexion 3+ 3+  Elbow extension 3- 3-  Wrist flexion 3- 3-  Wrist extension 3- 3-  Wrist ulnar deviation    Wrist radial deviation    Wrist pronation    Wrist supination    Grip strength 10 (avg of 3) 63.3 (avg of 3)   (Blank rows = not tested)    GAIT: Gait pattern:  slow and unsteady; wife assists in getting pt out of chair and guards as he takes off jacket d/t unsteadiness Assistive device utilized: Single point cane Level of assistance: CGA    TODAY'S TREATMENT:                                                                                                                              DATE: 10/06/22    PATIENT EDUCATION: Education details: prognosis,  POC, HEP; edu on benefits of working on addressing dizziness and balance in conjunction with cervical spine; provided yellow putty Person educated: Patient and Spouse Education method: Explanation, Demonstration, Tactile cues, Verbal cues, and Handouts Education comprehension: verbalized understanding  HOME EXERCISE PROGRAM: Access Code: [email or web address] URL: [email or web address] Date: 10/06/2022 Prepared by: [facility]  Exercises - Seated Cervical Rotation AROM  - 1 x daily - 5 x weekly - 2 sets - 10 reps - Seated Cervical Extension AROM  - 1 x daily - 5 x weekly - 2 sets - 10 reps -  Seated Cervical Sidebending AROM  - 1 x daily - 5 x weekly - 2 sets - 10 reps - Seated Scapular Retraction  - 1 x daily - 5 x weekly - 2 sets - 10 reps - 3 sec hold - Putty Squeezes  - 2 x daily - 7 x weekly    GOALS: Goals reviewed with patient? Yes  SHORT TERM GOALS: Target date: 10/27/2022  Patient to be independent with initial HEP. Baseline: HEP initiated Goal status: INITIAL    LONG TERM GOALS: Target date: 11/17/2022  Patient to be independent with advanced HEP. Baseline: Not yet initiated  Goal status: INITIAL  Patient to demonstrate B UE and cervical strength >/=4/5.  Baseline: See above Goal status: INITIAL  Patient to demonstrate resting posture of 7 degrees cervical flexion, 5 degrees R side bending for improved positioning and decreased pain.  Baseline: Resting in 17 deg flexion, 15 deg R sidebending Goal status: INITIAL  Patient to demonstrate cervical AROM WFL and without pain limiting.  Baseline: see above Goal status: INITIAL  Patient to report/demonstrate knowledge of self-management of neck pain at home.  Baseline: Unable Goal status: INITIAL  Patient to score at least 40/56 on Berg in order to decrease risk of falls.  Baseline: NT Goal status: INITIAL   ASSESSMENT:  CLINICAL IMPRESSION:  Patient is a 67 y/o M  presenting to [facility] with wife with c/o chronic neck pain and postural changes for the past several years. Patient has had 1 round of Dysport injections for cervical dystonia; next one in April. Reports that pain levels are worsening and difficulty completing ADLs such as shaving and washing hair. Admits to multiple previous falls and dizziness which he is apprehensive about addressing in PT. Patient today presenting with abnormal resting posture, limited and painful cervical AROM,  decreased cervical strength, decreased R>L UE and grip strength, imbalance, and gait deviations. Patient was educated on gentle stretching and strengthening HEP and reported understanding. Would benefit from skilled PT services 2 x/week for 6 weeks to address aforementioned impairments in order to optimize level of function.    OBJECTIVE IMPAIRMENTS: Abnormal gait, decreased activity tolerance, decreased balance, difficulty walking, decreased ROM, decreased strength, dizziness, hypomobility, increased fascial restrictions, increased muscle spasms, impaired flexibility, impaired sensation, impaired UE functional use, improper body mechanics, postural dysfunction, and pain.   ACTIVITY LIMITATIONS: carrying, lifting, bending, standing, squatting, stairs, transfers, bathing, toileting, dressing, self feeding, reach over head, hygiene/grooming, and locomotion level  PARTICIPATION LIMITATIONS: meal prep, cleaning, laundry, shopping, community activity, and church  PERSONAL FACTORS: Age, Behavior pattern, Past/current experiences, Time since onset of injury/illness/exacerbation, and 3+ comorbidities: Pacemaker, anxiety and depression, bradycardia, cervical myelopathy, charcot's foot, DMII, HTN, migraines, HLD, C3-6, ACDF 2020, L LE fasciotomy 2020, L2-4 laminectomy 2022  are also affecting patient's functional outcome.   REHAB POTENTIAL: Good  CLINICAL DECISION MAKING: Evolving/moderate complexity  EVALUATION COMPLEXITY:  Moderate  PLAN:  PT FREQUENCY: 2x/week  PT DURATION: 6 weeks  PLANNED INTERVENTIONS: Therapeutic exercises, Therapeutic activity, Neuromuscular re-education, Balance training, Gait training, Patient/Family education, Self Care, Joint mobilization, Stair training, Vestibular training, Canalith repositioning, Aquatic Therapy, Dry Needling, Cryotherapy, Moist heat, Taping, Manual therapy, and Re-evaluation  PLAN FOR NEXT SESSION: palpate cervical spine to assess for tension/TTP; review HEP, Berg if patient agreeable; STM and passive stretching to cervical musculature    Janene Harvey, PT, DPT 10/06/22 11:31 AM  [facility] at [facility] [address], [place], [place] [address] Phone # [phone]  Fax # [phone]

## 2022-10-06 ENCOUNTER — Encounter: Payer: Self-pay | Admitting: Physical Therapy

## 2022-10-06 ENCOUNTER — Other Ambulatory Visit (HOSPITAL_COMMUNITY): Payer: Self-pay

## 2022-10-06 ENCOUNTER — Ambulatory Visit: Payer: Medicare PPO | Attending: Physical Medicine & Rehabilitation | Admitting: Physical Therapy

## 2022-10-06 ENCOUNTER — Other Ambulatory Visit: Payer: Self-pay

## 2022-10-06 DIAGNOSIS — R293 Abnormal posture: Secondary | ICD-10-CM | POA: Insufficient documentation

## 2022-10-06 DIAGNOSIS — G243 Spasmodic torticollis: Secondary | ICD-10-CM | POA: Insufficient documentation

## 2022-10-06 DIAGNOSIS — M542 Cervicalgia: Secondary | ICD-10-CM

## 2022-10-06 DIAGNOSIS — R42 Dizziness and giddiness: Secondary | ICD-10-CM | POA: Diagnosis present

## 2022-10-06 DIAGNOSIS — M6281 Muscle weakness (generalized): Secondary | ICD-10-CM | POA: Insufficient documentation

## 2022-10-06 DIAGNOSIS — R2681 Unsteadiness on feet: Secondary | ICD-10-CM | POA: Diagnosis present

## 2022-10-07 NOTE — Therapy (Signed)
OUTPATIENT PHYSICAL THERAPY NEURO TREATMENT   Patient Name: Antonio Oliver MRN: [id number] DOB:07-Dec-1955, 67 y.o., male Today's Date: 10/08/2022   PCP: Cher Nakai, MD  REFERRING PROVIDER: Meredith Staggers, MD  END OF SESSION:  PT End of Session - 10/08/22 1016     Visit Number 2    Number of Visits 13    Date for PT Re-Evaluation 11/17/22    Authorization Type [employer]    Authorization Time Period approved 13 PT visits from 10/06/2022-11/17/2022    Authorization - Visit Number 2    Authorization - Number of Visits 13    PT Start Time [phone]    PT Stop Time 0930    PT Time Calculation (min) 43 min    Activity Tolerance Patient tolerated treatment well    Behavior During Therapy [facility] for tasks assessed/performed              Past Medical History:  Diagnosis Date   Anxiety and depression    Ascending aorta dilation ([place]) 03/06/2019   4.3 cm 2022   Benign prostatic hyperplasia without lower urinary tract symptoms 11/17/2016   Last Assessment & Plan:  Stable PSA today.  Formatting of this note might be different from the original. Last Assessment & Plan:  Stable PSA today. Last Assessment & Plan:  Formatting of this note might be different from the original. Stable PSA today.   BPH (benign prostatic hyperplasia)    Bradycardia 02/01/2019   Cervical myelopathy ([place]) 11/14/2018   Charcot's joint of foot 03/05/2019   Chronic back pain 04/03/2013   Chronic inflammatory arthritis 11/17/2016   History of positive rheumatoid factor in the past. Denies placement on immunosuppressive therapy   Chronic pain syndrome    Failed back surgical syndrome.  Chronic neck pain.  Chronic low back pain with sciatica bilateral   Colon cancer screening    2023 cologuard neg   Complete heart block (HCC)    Following AVR s/p [employer] PPM in 03/2017   DM2 (diabetes mellitus, type 2) ([place]) 04/03/2013   Last Assessment & Plan:  Formatting of this note might be different from the original.  Diabetes is unchanged.  Continue current treatment regimen. Regular aerobic exercise. Diabetes will be reassessed in 3 months. Will check A1c today. Denies any problems with feet or sensation.   Elevated PSA 11/2021   Enthesopathy of ankle and tarsus 04/04/2013   Last Assessment & Plan:  Continues with swelling after prolonged standing.  Still  Wearing support hose, which helps.   Essential hypertension 10/30/2018   FHx: migraine headaches 04/04/2013   Fracture of capitate bone of wrist 02/26/2019   Fracture of tibia 03/05/2019   GERD (gastroesophageal reflux disease) 10/30/2018   Glaucoma 04/03/2013   Hemangioma of skin and subcutaneous tissue 07/07/2017   History of aortic valve replacement 2018   bicuspid AV   History of kidney stones 11/17/2016   History of sleep apnea    wt loss->gone   Hypothyroidism 10/30/2018   IDA (iron deficiency anemia) 11/2021   malabs d/t gastric bipass   Lesion of plantar nerve 04/04/2013   Lumbar stenosis with neurogenic claudication 01/08/2021   Major depressive disorder, recurrent ([place])    ECT in the remote past.  Patient disabled due to recurrent depression and chronic pain.   Migraine syndrome    Mixed dyslipidemia 10/30/2018   Morbid obesity ([place]) 10/27/2011   Nondisplaced fracture of medial cuneiform of left foot, initial encounter for closed fracture 11/16/2018  Osteoarthritis 10/30/2018   Personal history of tobacco use, presenting hazards to health 04/04/2013   Presence of permanent cardiac pacemaker    Severe single current episode of major depressive disorder, without psychotic features ([place]) 11/17/2016   Status post bariatric surgery 04/05/2011   bipass surg --presurg wt 310 lbs   Syncope 02/14/2019   Vitamin D deficiency 10/30/2018   Past Surgical History:  Procedure Laterality Date   ANTERIOR CERVICAL DECOMP/DISCECTOMY FUSION N/A 11/17/2018   Procedure: CERVICAL THREE-CERVICAL FOUR, CERVICAL FOUR-CERVICAL FIVE, CERVICAL  FIVE-CERVICAL SIX ANTERIOR CERVICAL DECOMPRESSION/DISCECTOMY FUSION;  Surgeon: Earnie Larsson, MD;  Location: [place];  Service: Neurosurgery;  Laterality: N/A;   AORTIC VALVE REPLACEMENT  25/9563   APPLICATION OF WOUND VAC Left 04/05/2019   Procedure: Application Of Wound Vac;  Surgeon: Erle Crocker, MD;  Location: [place];  Service: Orthopedics;  Laterality: Left;   BACK SURGERY     x4   CARDIOVASCULAR STRESS TEST  05/05/2021   lexiscan neg   FASCIOTOMY Left 04/05/2019   Left leg 2 compartment fasciotomy   FASCIOTOMY Left 04/05/2019   Procedure: FASCIOTOMY LEFT LOWER LEG;  Surgeon: Erle Crocker, MD;  Location: [place];  Service: Orthopedics;  Laterality: Left;   FOOT NEUROMA SURGERY     GASTRIC BYPASS  2012   HEMATOMA EVACUATION Left 04/05/2019   Procedure: Evacuation Hematoma Left Lower Leg;  Surgeon: Erle Crocker, MD;  Location: [place];  Service: Orthopedics;  Laterality: Left;   HEMORRHOID SURGERY     over 30 years ago   [place] umb   I & D EXTREMITY Left 04/13/2022   Procedure: IRRIGATION AND DEBRIDEMENT OF ELBOW AND BURSECTOMY;  Surgeon: Iran Planas, MD;  Location: [place];  Service: Orthopedics;  Laterality: Left;   LUMBAR LAMINECTOMY/DECOMPRESSION MICRODISCECTOMY N/A 01/08/2021   Procedure: CENTRAL LUMBAR LAMINECTOMY LUMBAR TWO-THREE, LUMBAR THREE-FOUR;  Surgeon: Jessy Oto, MD;  Location: [place];  Service: Orthopedics;  Laterality: N/A;   NASAL SINUS SURGERY     x2   NECK SURGERY     OLECRANON BURSECTOMY  2023   d/t septic bursitis   TRANSTHORACIC ECHOCARDIOGRAM  12/2020   EF normal, mod MR, ascending AA (4.4 cm)   Patient Active Problem List   Diagnosis Date Noted   Cervical dystonia 08/25/2022   Postlaminectomy syndrome, cervical region 08/25/2022   Septic bursitis 04/16/2022   Right bundle branch block 04/15/2022   Orthostasis 04/15/2022   History of aortic stenosis 04/13/2022   Septic bursitis of elbow 04/12/2022   Diabetes mellitus  type 2 in nonobese ([place]) 04/16/2021   Lumbar stenosis with neurogenic claudication 01/08/2021   Back pain 01/05/2021   BPH (benign prostatic hyperplasia)    Anxiety and depression    Hypertension associated with diabetes ([place])    Presence of permanent cardiac pacemaker 12/28/2019   Ascending aorta dilation ([place]) 03/06/2019   Cervical myelopathy ([place]) 11/14/2018   Hyperlipidemia associated with type 2 diabetes mellitus ([place]) 10/30/2018   Vitamin D deficiency 10/30/2018   Osteoarthritis 10/30/2018   GERD (gastroesophageal reflux disease) 10/30/2018   Hypothyroidism 10/30/2018   Essential hypertension 10/30/2018   Mixed dyslipidemia 10/30/2018   Hemangioma of skin and subcutaneous tissue 07/07/2017   Lentigo 07/07/2017   Multiple benign melanocytic nevi 07/07/2017   Complete heart block ([place]) 05/04/2017   S/P AVR (aortic valve replacement) 03/10/2017   Bicuspid aortic valve 12/22/2016   Benign prostatic hyperplasia without lower urinary tract symptoms 11/17/2016   Chronic inflammatory  arthritis 11/17/2016   Chronic bilateral low back pain 11/17/2016   Heart murmur 11/17/2016   History of kidney stones 11/17/2016   Anxiety 06/20/2013   Personal history of tobacco use, presenting hazards to health 04/04/2013   Neck pain 04/04/2013   Status post bariatric surgery 04/04/2013   FHx: migraine headaches 04/04/2013   DM2 (diabetes mellitus, type 2) ([place]) 04/03/2013   Chronic pain 04/03/2013   Glaucoma 04/03/2013   Morbid obesity ([place]) 10/27/2011    ONSET DATE: several years   REFERRING DIAG: G24.3 (ICD-10-CM) - Cervical dystonia  THERAPY DIAG:  Abnormal posture  Cervicalgia  Muscle weakness (generalized)  Dizziness and giddiness  Unsteadiness on feet  Rationale for Evaluation and Treatment: Rehabilitation  SUBJECTIVE:                                                                                                                                                                                              SUBJECTIVE STATEMENT: I hurt and I just don't feel good- I feel weak. Denies other symptoms. Has done HEP but not tried putty d/t his hand hurting too much.   Pt accompanied by: self  PERTINENT HISTORY: Pacemaker, anxiety and depression, bradycardia, cervical myelopathy, charcot's foot, DMII, HTN, migraines, HLD, C3-6, ACDF 2020, L LE fasciotomy 2020, L2-4 laminectomy 2022; pt reports "bad wrist" on R side from cyst that has been removed and has come back  PAIN:  Are you having pain? Yes: NPRS scale: 8-9/10 Pain location: R shoulders and down the arm and into shoulderblade Pain description: throbbing Aggravating factors: movement Relieving factors: pain meds, lidoderm patches  PRECAUTIONS: ICD/Pacemaker  WEIGHT BEARING RESTRICTIONS: No  FALLS: Has patient fallen in last 6 months? Yes. Number of falls wife reports multiple falls  LIVING ENVIRONMENT: Lives with: lives with their spouse Lives in: House/apartment; condo Stairs:  1 step to enter Has following equipment at home: Single point cane, Environmental consultant - 2 wheeled, Wheelchair (manual), Electronics engineer, and Grab bars  PLOF: Independent with basic ADLs  PATIENT GOALS: improve neck pain  OBJECTIVE:     TODAY'S TREATMENT: 10/08/22 Activity Comments  Vitals  141/100 mmHg, 67 bpm,   Diaphragmatic breathing instruction Self-feedback with hands on belly  Palpation  TTP and tight in R rhomboids and thoracic paraspinals, UT, LS, lateral triceps   Vitals recheck  124/94 mmHg, 71 bpm  Manual therapy STM and manual TPR to R rhomboids, thoracic paraspinals, lats, UT, LS, gentle cervical stretching to neutral  Self-STM ball on wall to R rhomboids Reported good benefit  review HEP: sitting cervical rotation 5x sitting cervical extension  5x sitting cervical sidebending  5x sitting scapular retraction 5x3" yellow putty squeezes x 1 min  To tolerance; used 1/2 putty for decreased challenge      PATIENT  EDUCATION: Education details: edu on muscular pain in rhomboids and how it can respond to heat, massage, DN; edu on DN Person educated: Patient Education method: Explanation, Demonstration, Tactile cues, Verbal cues, and Handouts Education comprehension: verbalized understanding and returned demonstration    Below measures were taken at time of initial evaluation unless otherwise specified:   DIAGNOSTIC FINDINGS: 06/22/22 cervical MRI: Stable postoperative changes from C3-C6 with anterior and interbody fusion hardware.  Stable degenerative subluxation at C6-7 with a bulging uncovered Disc. Small foci of increased T2 signal intensity in the spinal cord at C3-4 suggesting chronic ischemic changes/myelomalacia  COGNITION: Overall cognitive status: Within functional limits for tasks assessed   SENSATION: Diminished sensation in R arm and 5th digit, R thigh and foot  COORDINATION: Alternating pronation/supination: slow B Alternating toe tap: WNL B Finger to nose: WNL B   POSTURE: resting in R cervical sidebending and neck flexion; rounded shoulders and slight thoracic kyphosis  Resting in 17 deg flexion, 15 deg R sidebending   CERVICAL ROM:   Active ROM A/PROM (deg) eval  Flexion 45 *pain  Extension 4 *pain  Right lateral flexion 28 *pain  Left lateral flexion -10 *pain  Right rotation 37 * pain  Left rotation 40 *pain   (Blank rows = not tested)   CERVICAL MMT:   MMT /5  Flexion 3-  Extension 3-  Right lateral flexion 3-  Left lateral flexion 3-  Right rotation 3-  Left rotation 3-   (Blank rows = not tested)  UPPER EXTREMITY MMT:  MMT Right eval Left eval  Shoulder flexion 4- 4-  Shoulder extension    Shoulder abduction 3+ 4  Shoulder adduction    Shoulder extension    Shoulder internal rotation 3+ 3+  Shoulder external rotation 3+ 3+  Middle trapezius    Lower trapezius    Elbow flexion 3+ 3+  Elbow extension 3- 3-  Wrist flexion 3- 3-  Wrist  extension 3- 3-  Wrist ulnar deviation    Wrist radial deviation    Wrist pronation    Wrist supination    Grip strength 10 (avg of 3) 63.3 (avg of 3)   (Blank rows = not tested)    GAIT: Gait pattern:  slow and unsteady; wife assists in getting pt out of chair and guards as he takes off jacket d/t unsteadiness Assistive device utilized: Single point cane Level of assistance: CGA    TODAY'S TREATMENT:                                                                                                                              DATE: 10/06/22    PATIENT EDUCATION: Education details: prognosis, POC, HEP; edu on benefits of working on addressing dizziness and balance in conjunction with cervical spine; provided yellow  putty Person educated: Patient and Spouse Education method: Explanation, Demonstration, Tactile cues, Verbal cues, and Handouts Education comprehension: verbalized understanding  HOME EXERCISE PROGRAM: Access Code: [email or web address] URL: [email or web address] Date: 10/06/2022 Prepared by: [facility]  Exercises - Seated Cervical Rotation AROM  - 1 x daily - 5 x weekly - 2 sets - 10 reps - Seated Cervical Extension AROM  - 1 x daily - 5 x weekly - 2 sets - 10 reps - Seated Cervical Sidebending AROM  - 1 x daily - 5 x weekly - 2 sets - 10 reps - Seated Scapular Retraction  - 1 x daily - 5 x weekly - 2 sets - 10 reps - 3 sec hold - Putty Squeezes  - 2 x daily - 7 x weekly    GOALS: Goals reviewed with patient? Yes  SHORT TERM GOALS: Target date: 10/27/2022  Patient to be independent with initial HEP. Baseline: HEP initiated Goal status: MET 10/08/22    LONG TERM GOALS: Target date: 11/17/2022  Patient to be independent with advanced HEP. Baseline: Not yet initiated  Goal status: IN PROGRESS  Patient to demonstrate B UE and cervical strength >/=4/5.  Baseline: See above Goal status: IN PROGRESS  Patient to  demonstrate resting posture of 7 degrees cervical flexion, 5 degrees R side bending for improved positioning and decreased pain.  Baseline: Resting in 17 deg flexion, 15 deg R sidebending Goal status: IN PROGRESS  Patient to demonstrate cervical AROM WFL and without pain limiting.  Baseline: see above Goal status: IN PROGRESS  Patient to report/demonstrate knowledge of self-management of neck pain at home.  Baseline: Unable Goal status: IN PROGRESS  Patient to score at least 40/56 on Berg in order to decrease risk of falls.  Baseline: NT Goal status: IN PROGRESS   ASSESSMENT:  CLINICAL IMPRESSION: Patient arrived to session with report of increased pain and fatigue; no other symptoms. BP was elevated, thus trialed diaphragmatic breathing to improve. Patient tolerated MT to R cervical and periscapular musculature with good tolerance. Very TTP in R LS, rhomboids, and thoracic paraspinals. Reviewed HEP which was tolerated well. Also educated on self-STM to help manage pain at home. Patient received edu on DN and reported understanding. Patient reported feeling slightly better at end of session.    OBJECTIVE IMPAIRMENTS: Abnormal gait, decreased activity tolerance, decreased balance, difficulty walking, decreased ROM, decreased strength, dizziness, hypomobility, increased fascial restrictions, increased muscle spasms, impaired flexibility, impaired sensation, impaired UE functional use, improper body mechanics, postural dysfunction, and pain.   ACTIVITY LIMITATIONS: carrying, lifting, bending, standing, squatting, stairs, transfers, bathing, toileting, dressing, self feeding, reach over head, hygiene/grooming, and locomotion level  PARTICIPATION LIMITATIONS: meal prep, cleaning, laundry, shopping, community activity, and church  PERSONAL FACTORS: Age, Behavior pattern, Past/current experiences, Time since onset of injury/illness/exacerbation, and 3+ comorbidities: Pacemaker, anxiety and  depression, bradycardia, cervical myelopathy, charcot's foot, DMII, HTN, migraines, HLD, C3-6, ACDF 2020, L LE fasciotomy 2020, L2-4 laminectomy 2022  are also affecting patient's functional outcome.   REHAB POTENTIAL: Good  CLINICAL DECISION MAKING: Evolving/moderate complexity  EVALUATION COMPLEXITY: Moderate  PLAN:  PT FREQUENCY: 2x/week  PT DURATION: 6 weeks  PLANNED INTERVENTIONS: Therapeutic exercises, Therapeutic activity, Neuromuscular re-education, Balance training, Gait training, Patient/Family education, Self Care, Joint mobilization, Stair training, Vestibular training, Canalith repositioning, Aquatic Therapy, Dry Needling, Cryotherapy, Moist heat, Taping, Manual therapy, and Re-evaluation  PLAN FOR NEXT SESSION: DN;  Berg if patient agreeable; STM and passive stretching to cervical musculature  Janene Harvey, PT, DPT 10/08/22 10:20 AM  [facility] at [facility] [address] [place], [place], [place] [address] Phone # [phone] Fax # [phone]

## 2022-10-08 ENCOUNTER — Ambulatory Visit: Payer: Medicare PPO | Attending: Physical Medicine & Rehabilitation | Admitting: Physical Therapy

## 2022-10-08 ENCOUNTER — Encounter: Payer: Self-pay | Admitting: Physical Therapy

## 2022-10-08 DIAGNOSIS — R293 Abnormal posture: Secondary | ICD-10-CM | POA: Diagnosis present

## 2022-10-08 DIAGNOSIS — M542 Cervicalgia: Secondary | ICD-10-CM

## 2022-10-08 DIAGNOSIS — R2681 Unsteadiness on feet: Secondary | ICD-10-CM | POA: Diagnosis present

## 2022-10-08 DIAGNOSIS — M6281 Muscle weakness (generalized): Secondary | ICD-10-CM

## 2022-10-08 DIAGNOSIS — R42 Dizziness and giddiness: Secondary | ICD-10-CM | POA: Diagnosis present

## 2022-10-08 NOTE — Patient Instructions (Signed)

## 2022-10-11 NOTE — Therapy (Signed)
OUTPATIENT PHYSICAL THERAPY NEURO TREATMENT   Patient Name: Antonio Oliver MRN: [id number] DOB:03/05/1956, 67 y.o., male Today's Date: 10/12/2022   PCP: Cher Nakai, MD  REFERRING PROVIDER: Meredith Staggers, MD  END OF SESSION:  PT End of Session - 10/12/22 1103     Visit Number 3    Number of Visits 13    Date for PT Re-Evaluation 11/17/22    Authorization Type [employer]    Authorization Time Period approved 13 PT visits from 10/06/2022-11/17/2022    Authorization - Visit Number 3    Authorization - Number of Visits 13    PT Start Time 1020    PT Stop Time 1059    PT Time Calculation (min) 39 min    Equipment Utilized During Treatment Gait belt    Activity Tolerance Patient tolerated treatment well    Behavior During Therapy [facility] for tasks assessed/performed               Past Medical History:  Diagnosis Date   Anxiety and depression    Ascending aorta dilation ([place]) 03/06/2019   4.3 cm 2022   Benign prostatic hyperplasia without lower urinary tract symptoms 11/17/2016   Last Assessment & Plan:  Stable PSA today.  Formatting of this note might be different from the original. Last Assessment & Plan:  Stable PSA today. Last Assessment & Plan:  Formatting of this note might be different from the original. Stable PSA today.   BPH (benign prostatic hyperplasia)    Bradycardia 02/01/2019   Cervical myelopathy ([place]) 11/14/2018   Charcot's joint of foot 03/05/2019   Chronic back pain 04/03/2013   Chronic inflammatory arthritis 11/17/2016   History of positive rheumatoid factor in the past. Denies placement on immunosuppressive therapy   Chronic pain syndrome    Failed back surgical syndrome.  Chronic neck pain.  Chronic low back pain with sciatica bilateral   Colon cancer screening    2023 cologuard neg   Complete heart block (HCC)    Following AVR s/p [employer] PPM in 03/2017   DM2 (diabetes mellitus, type 2) ([place]) 04/03/2013   Last Assessment & Plan:  Formatting  of this note might be different from the original. Diabetes is unchanged.  Continue current treatment regimen. Regular aerobic exercise. Diabetes will be reassessed in 3 months. Will check A1c today. Denies any problems with feet or sensation.   Elevated PSA 11/2021   Enthesopathy of ankle and tarsus 04/04/2013   Last Assessment & Plan:  Continues with swelling after prolonged standing.  Still  Wearing support hose, which helps.   Essential hypertension 10/30/2018   FHx: migraine headaches 04/04/2013   Fracture of capitate bone of wrist 02/26/2019   Fracture of tibia 03/05/2019   GERD (gastroesophageal reflux disease) 10/30/2018   Glaucoma 04/03/2013   Hemangioma of skin and subcutaneous tissue 07/07/2017   History of aortic valve replacement 2018   bicuspid AV   History of kidney stones 11/17/2016   History of sleep apnea    wt loss->gone   Hypothyroidism 10/30/2018   IDA (iron deficiency anemia) 11/2021   malabs d/t gastric bipass   Lesion of plantar nerve 04/04/2013   Lumbar stenosis with neurogenic claudication 01/08/2021   Major depressive disorder, recurrent ([place])    ECT in the remote past.  Patient disabled due to recurrent depression and chronic pain.   Migraine syndrome    Mixed dyslipidemia 10/30/2018   Morbid obesity ([place]) 10/27/2011   Nondisplaced fracture of medial cuneiform  of left foot, initial encounter for closed fracture 11/16/2018   Osteoarthritis 10/30/2018   Personal history of tobacco use, presenting hazards to health 04/04/2013   Presence of permanent cardiac pacemaker    Severe single current episode of major depressive disorder, without psychotic features ([place]) 11/17/2016   Status post bariatric surgery 04/05/2011   bipass surg --presurg wt 310 lbs   Syncope 02/14/2019   Vitamin D deficiency 10/30/2018   Past Surgical History:  Procedure Laterality Date   ANTERIOR CERVICAL DECOMP/DISCECTOMY FUSION N/A 11/17/2018   Procedure: CERVICAL THREE-CERVICAL  FOUR, CERVICAL FOUR-CERVICAL FIVE, CERVICAL FIVE-CERVICAL SIX ANTERIOR CERVICAL DECOMPRESSION/DISCECTOMY FUSION;  Surgeon: Earnie Larsson, MD;  Location: [place];  Service: Neurosurgery;  Laterality: N/A;   AORTIC VALVE REPLACEMENT  [id number]/7867   APPLICATION OF WOUND VAC Left 04/05/2019   Procedure: Application Of Wound Vac;  Surgeon: Erle Crocker, MD;  Location: [place];  Service: Orthopedics;  Laterality: Left;   BACK SURGERY     x4   CARDIOVASCULAR STRESS TEST  05/05/2021   lexiscan neg   FASCIOTOMY Left 04/05/2019   Left leg 2 compartment fasciotomy   FASCIOTOMY Left 04/05/2019   Procedure: FASCIOTOMY LEFT LOWER LEG;  Surgeon: Erle Crocker, MD;  Location: [place];  Service: Orthopedics;  Laterality: Left;   FOOT NEUROMA SURGERY     GASTRIC BYPASS  2012   HEMATOMA EVACUATION Left 04/05/2019   Procedure: Evacuation Hematoma Left Lower Leg;  Surgeon: Erle Crocker, MD;  Location: [place];  Service: Orthopedics;  Laterality: Left;   HEMORRHOID SURGERY     over 30 years ago   [place] umb   I & D EXTREMITY Left 04/13/2022   Procedure: IRRIGATION AND DEBRIDEMENT OF ELBOW AND BURSECTOMY;  Surgeon: Iran Planas, MD;  Location: [place];  Service: Orthopedics;  Laterality: Left;   LUMBAR LAMINECTOMY/DECOMPRESSION MICRODISCECTOMY N/A 01/08/2021   Procedure: CENTRAL LUMBAR LAMINECTOMY LUMBAR TWO-THREE, LUMBAR THREE-FOUR;  Surgeon: Jessy Oto, MD;  Location: [place];  Service: Orthopedics;  Laterality: N/A;   NASAL SINUS SURGERY     x2   NECK SURGERY     OLECRANON BURSECTOMY  2023   d/t septic bursitis   TRANSTHORACIC ECHOCARDIOGRAM  12/2020   EF normal, mod MR, ascending AA (4.4 cm)   Patient Active Problem List   Diagnosis Date Noted   Cervical dystonia 08/25/2022   Postlaminectomy syndrome, cervical region 08/25/2022   Septic bursitis 04/16/2022   Right bundle branch block 04/15/2022   Orthostasis 04/15/2022   History of aortic stenosis 04/13/2022   Septic  bursitis of elbow 04/12/2022   Diabetes mellitus type 2 in nonobese ([place]) 04/16/2021   Lumbar stenosis with neurogenic claudication 01/08/2021   Back pain 01/05/2021   BPH (benign prostatic hyperplasia)    Anxiety and depression    Hypertension associated with diabetes ([place])    Presence of permanent cardiac pacemaker 12/28/2019   Ascending aorta dilation ([place]) 03/06/2019   Cervical myelopathy ([place]) 11/14/2018   Hyperlipidemia associated with type 2 diabetes mellitus ([place]) 10/30/2018   Vitamin D deficiency 10/30/2018   Osteoarthritis 10/30/2018   GERD (gastroesophageal reflux disease) 10/30/2018   Hypothyroidism 10/30/2018   Essential hypertension 10/30/2018   Mixed dyslipidemia 10/30/2018   Hemangioma of skin and subcutaneous tissue 07/07/2017   Lentigo 07/07/2017   Multiple benign melanocytic nevi 07/07/2017   Complete heart block ([place]) 05/04/2017   S/P AVR (aortic valve replacement) 03/10/2017   Bicuspid aortic valve 12/22/2016   Benign prostatic  hyperplasia without lower urinary tract symptoms 11/17/2016   Chronic inflammatory arthritis 11/17/2016   Chronic bilateral low back pain 11/17/2016   Heart murmur 11/17/2016   History of kidney stones 11/17/2016   Anxiety 06/20/2013   Personal history of tobacco use, presenting hazards to health 04/04/2013   Neck pain 04/04/2013   Status post bariatric surgery 04/04/2013   FHx: migraine headaches 04/04/2013   DM2 (diabetes mellitus, type 2) ([place]) 04/03/2013   Chronic pain 04/03/2013   Glaucoma 04/03/2013   Morbid obesity ([place]) 10/27/2011    ONSET DATE: several years   REFERRING DIAG: G24.3 (ICD-10-CM) - Cervical dystonia  THERAPY DIAG:  Abnormal posture  Cervicalgia  Muscle weakness (generalized)  Dizziness and giddiness  Unsteadiness on feet  Rationale for Evaluation and Treatment: Rehabilitation  SUBJECTIVE:                                                                                                                                                                                              SUBJECTIVE STATEMENT: Wife reports that pt does not feel good-lightheaded/dizzy. Wife reports BP fluctuates considerably and  patient has orthostasis "no one has been able to figure it out."   Pt accompanied by: wife  PERTINENT HISTORY: Pacemaker, anxiety and depression, bradycardia, cervical myelopathy, charcot's foot, DMII, HTN, migraines, HLD, C3-6, ACDF 2020, L LE fasciotomy 2020, L2-4 laminectomy 2022; pt reports "bad wrist" on R side from cyst that has been removed and has come back  PAIN:  Are you having pain? Yes: NPRS scale: 8-9/10 Pain location: R shoulders and down the arm and into shoulderblade Pain description: throbbing Aggravating factors: movement Relieving factors: pain meds, lidoderm patches  PRECAUTIONS: ICD/Pacemaker  WEIGHT BEARING RESTRICTIONS: No  FALLS: Has patient fallen in last 6 months? Yes. Number of falls wife reports multiple falls  LIVING ENVIRONMENT: Lives with: lives with their spouse Lives in: House/apartment; condo Stairs:  1 step to enter Has following equipment at home: Single point cane, Environmental consultant - 2 wheeled, Wheelchair (manual), Electronics engineer, and Grab bars  PLOF: Independent with basic ADLs  PATIENT GOALS: improve neck pain  OBJECTIVE:     TODAY'S TREATMENT: 10/12/22 Activity Comments  vitals 97/69 mmHg, 63 bpm; 98% spO2   Sitting ankle pumps, marches, LAQ In order to increase BP   vitals 98/71 mmHg   Scapular retractions 10x3"   sitting red TB row 2x10 Good form; cues to hold head in midline  sitting red TB extension 2x10 Good form; cues to hold head in midline  L SBing cervical isometric ball with PT holding ball 2 sets 5x5"  Cueing to avoid compensation  with trunk   extension cervical isometric with PT holding ball 2 sets 5x5" Cueing to avoid compensation with trunk   Vitals  98/74 mmHg; 60bpm   Marching before STS and upon standing before ambulating  into waiting room with RW Pt chose to take his [place] out to the car; reported "I feel better now" upon leaving     [place] Last updated: 10/12/22 Access Code: [email or web address] URL: [email or web address] Date: 10/12/2022 Prepared by: [facility]  Exercises - Seated Cervical Rotation AROM  - 1 x daily - 5 x weekly - 2 sets - 10 reps - Seated Cervical Extension AROM  - 1 x daily - 5 x weekly - 2 sets - 10 reps - Seated Cervical Sidebending AROM  - 1 x daily - 5 x weekly - 2 sets - 10 reps - Seated Scapular Retraction  - 1 x daily - 5 x weekly - 2 sets - 10 reps - 3 sec hold - Putty Squeezes  - 2 x daily - 7 x weekly - Seated Shoulder Row with Anchored Resistance  - 1 x daily - 5 x weekly - 2 sets - 10 reps - Seated Shoulder Extension and Scapular Retraction with Resistance  - 1 x daily - 5 x weekly - 2 sets - 10 reps   PATIENT EDUCATION: Education details: edu on use of compression stockings, abdominal binder, hydrating with electrolyte beverage, ankle pumps; HEP update Person educated: Patient and Spouse Education method: Explanation, Demonstration, Tactile cues, Verbal cues, and Handouts Education comprehension: verbalized understanding and returned demonstration    Below measures were taken at time of initial evaluation unless otherwise specified:   DIAGNOSTIC FINDINGS: 06/22/22 cervical MRI: Stable postoperative changes from C3-C6 with anterior and interbody fusion hardware.  Stable degenerative subluxation at C6-7 with a bulging uncovered Disc. Small foci of increased T2 signal intensity in the spinal cord at C3-4 suggesting chronic ischemic changes/myelomalacia  COGNITION: Overall cognitive status: Within functional limits for tasks assessed   SENSATION: Diminished sensation in R arm and 5th digit, R thigh and foot  COORDINATION: Alternating pronation/supination: slow B Alternating toe tap: WNL B Finger to nose: WNL  B   POSTURE: resting in R cervical sidebending and neck flexion; rounded shoulders and slight thoracic kyphosis  Resting in 17 deg flexion, 15 deg R sidebending   CERVICAL ROM:   Active ROM A/PROM (deg) eval  Flexion 45 *pain  Extension 4 *pain  Right lateral flexion 28 *pain  Left lateral flexion -10 *pain  Right rotation 37 * pain  Left rotation 40 *pain   (Blank rows = not tested)   CERVICAL MMT:   MMT /5  Flexion 3-  Extension 3-  Right lateral flexion 3-  Left lateral flexion 3-  Right rotation 3-  Left rotation 3-   (Blank rows = not tested)  UPPER EXTREMITY MMT:  MMT Right eval Left eval  Shoulder flexion 4- 4-  Shoulder extension    Shoulder abduction 3+ 4  Shoulder adduction    Shoulder extension    Shoulder internal rotation 3+ 3+  Shoulder external rotation 3+ 3+  Middle trapezius    Lower trapezius    Elbow flexion 3+ 3+  Elbow extension 3- 3-  Wrist flexion 3- 3-  Wrist extension 3- 3-  Wrist ulnar deviation    Wrist radial deviation    Wrist pronation    Wrist supination    Grip strength 10 (avg of 3)  63.3 (avg of 3)   (Blank rows = not tested)    GAIT: Gait pattern:  slow and unsteady; wife assists in getting pt out of chair and guards as he takes off jacket d/t unsteadiness Assistive device utilized: Single point cane Level of assistance: CGA    TODAY'S TREATMENT:                                                                                                                              DATE: 10/06/22    PATIENT EDUCATION: Education details: prognosis, POC, HEP; edu on benefits of working on addressing dizziness and balance in conjunction with cervical spine; provided yellow putty Person educated: Patient and Spouse Education method: Explanation, Demonstration, Tactile cues, Verbal cues, and Handouts Education comprehension: verbalized understanding  HOME EXERCISE PROGRAM: Access Code: [email or web address] URL:  [email or web address] Date: 10/06/2022 Prepared by: [facility]  Exercises - Seated Cervical Rotation AROM  - 1 x daily - 5 x weekly - 2 sets - 10 reps - Seated Cervical Extension AROM  - 1 x daily - 5 x weekly - 2 sets - 10 reps - Seated Cervical Sidebending AROM  - 1 x daily - 5 x weekly - 2 sets - 10 reps - Seated Scapular Retraction  - 1 x daily - 5 x weekly - 2 sets - 10 reps - 3 sec hold - Putty Squeezes  - 2 x daily - 7 x weekly    GOALS: Goals reviewed with patient? Yes  SHORT TERM GOALS: Target date: 10/27/2022  Patient to be independent with initial HEP. Baseline: HEP initiated Goal status: MET 10/08/22    LONG TERM GOALS: Target date: 11/17/2022  Patient to be independent with advanced HEP. Baseline: Not yet initiated  Goal status: IN PROGRESS  Patient to demonstrate B UE and cervical strength >/=4/5.  Baseline: See above Goal status: IN PROGRESS  Patient to demonstrate resting posture of 7 degrees cervical flexion, 5 degrees R side bending for improved positioning and decreased pain.  Baseline: Resting in 17 deg flexion, 15 deg R sidebending Goal status: IN PROGRESS  Patient to demonstrate cervical AROM WFL and without pain limiting.  Baseline: see above Goal status: IN PROGRESS  Patient to report/demonstrate knowledge of self-management of neck pain at home.  Baseline: Unable Goal status: IN PROGRESS  Patient to score at least 40/56 on Berg in order to decrease risk of falls.  Baseline: NT Goal status: IN PROGRESS   ASSESSMENT:  CLINICAL IMPRESSION: Patient arrived to session with wife who reports that patient is not feeling well- feeling lightheaded and dizzy. Wife reports that patient has orthostasis and "no one has been able to figure it out." Vitals revealed hypotension- address with water break and seated exercise; only slightly improved however patient reported feeling slightly better. Able to  tolerate seated postural strengthening with good form. Encouraged use of RW upon exiting clinic to ensure stability- patient  reported feeling better upon leaving.    OBJECTIVE IMPAIRMENTS: Abnormal gait, decreased activity tolerance, decreased balance, difficulty walking, decreased ROM, decreased strength, dizziness, hypomobility, increased fascial restrictions, increased muscle spasms, impaired flexibility, impaired sensation, impaired UE functional use, improper body mechanics, postural dysfunction, and pain.   ACTIVITY LIMITATIONS: carrying, lifting, bending, standing, squatting, stairs, transfers, bathing, toileting, dressing, self feeding, reach over head, hygiene/grooming, and locomotion level  PARTICIPATION LIMITATIONS: meal prep, cleaning, laundry, shopping, community activity, and church  PERSONAL FACTORS: Age, Behavior pattern, Past/current experiences, Time since onset of injury/illness/exacerbation, and 3+ comorbidities: Pacemaker, anxiety and depression, bradycardia, cervical myelopathy, charcot's foot, DMII, HTN, migraines, HLD, C3-6, ACDF 2020, L LE fasciotomy 2020, L2-4 laminectomy 2022  are also affecting patient's functional outcome.   REHAB POTENTIAL: Good  CLINICAL DECISION MAKING: Evolving/moderate complexity  EVALUATION COMPLEXITY: Moderate  PLAN:  PT FREQUENCY: 2x/week  PT DURATION: 6 weeks  PLANNED INTERVENTIONS: Therapeutic exercises, Therapeutic activity, Neuromuscular re-education, Balance training, Gait training, Patient/Family education, Self Care, Joint mobilization, Stair training, Vestibular training, Canalith repositioning, Aquatic Therapy, Dry Needling, Cryotherapy, Moist heat, Taping, Manual therapy, and Re-evaluation  PLAN FOR NEXT SESSION: recheck BP; DN;  Berg if patient agreeable; STM and passive stretching to cervical musculature    Janene Harvey, PT, DPT 10/12/22 12:37 PM  [facility] at [facility] [address] [place], [place] [address] Phone # [phone] Fax # [phone]

## 2022-10-12 ENCOUNTER — Ambulatory Visit: Payer: Medicare PPO | Admitting: Physical Therapy

## 2022-10-12 ENCOUNTER — Encounter: Payer: Self-pay | Admitting: Physical Therapy

## 2022-10-12 DIAGNOSIS — R293 Abnormal posture: Secondary | ICD-10-CM | POA: Diagnosis not present

## 2022-10-12 DIAGNOSIS — R42 Dizziness and giddiness: Secondary | ICD-10-CM

## 2022-10-12 DIAGNOSIS — M6281 Muscle weakness (generalized): Secondary | ICD-10-CM

## 2022-10-12 DIAGNOSIS — M542 Cervicalgia: Secondary | ICD-10-CM

## 2022-10-12 DIAGNOSIS — R2681 Unsteadiness on feet: Secondary | ICD-10-CM

## 2022-10-13 NOTE — Therapy (Signed)
OUTPATIENT PHYSICAL THERAPY NEURO TREATMENT   Patient Name: Antonio Oliver MRN: [id number] DOB:1956-01-13, 67 y.o., male Today's Date: 10/15/2022   PCP: Cher Nakai, MD  REFERRING PROVIDER: Meredith Staggers, MD  END OF SESSION:  PT End of Session - 10/15/22 0933     Visit Number 4    Number of Visits 13    Date for PT Re-Evaluation 11/17/22    Authorization Type [employer]    Authorization Time Period approved 13 PT visits from 10/06/2022-11/17/2022    Authorization - Visit Number 4    Authorization - Number of Visits 13    PT Start Time [phone]    PT Stop Time 0931    PT Time Calculation (min) 48 min    Equipment Utilized During Treatment Gait belt    Activity Tolerance Patient tolerated treatment well    Behavior During Therapy [facility] for tasks assessed/performed                Past Medical History:  Diagnosis Date   Anxiety and depression    Ascending aorta dilation ([place]) 03/06/2019   4.3 cm 2022   Benign prostatic hyperplasia without lower urinary tract symptoms 11/17/2016   Last Assessment & Plan:  Stable PSA today.  Formatting of this note might be different from the original. Last Assessment & Plan:  Stable PSA today. Last Assessment & Plan:  Formatting of this note might be different from the original. Stable PSA today.   BPH (benign prostatic hyperplasia)    Bradycardia 02/01/2019   Cervical myelopathy ([place]) 11/14/2018   Charcot's joint of foot 03/05/2019   Chronic back pain 04/03/2013   Chronic inflammatory arthritis 11/17/2016   History of positive rheumatoid factor in the past. Denies placement on immunosuppressive therapy   Chronic pain syndrome    Failed back surgical syndrome.  Chronic neck pain.  Chronic low back pain with sciatica bilateral   Colon cancer screening    2023 cologuard neg   Complete heart block (HCC)    Following AVR s/p St Jude PPM in 03/2017   DM2 (diabetes mellitus, type 2) ([place]) 04/03/2013   Last Assessment & Plan:   Formatting of this note might be different from the original. Diabetes is unchanged.  Continue current treatment regimen. Regular aerobic exercise. Diabetes will be reassessed in 3 months. Will check A1c today. Denies any problems with feet or sensation.   Elevated PSA 11/2021   Enthesopathy of ankle and tarsus 04/04/2013   Last Assessment & Plan:  Continues with swelling after prolonged standing.  Still  Wearing support hose, which helps.   Essential hypertension 10/30/2018   FHx: migraine headaches 04/04/2013   Fracture of capitate bone of wrist 02/26/2019   Fracture of tibia 03/05/2019   GERD (gastroesophageal reflux disease) 10/30/2018   Glaucoma 04/03/2013   Hemangioma of skin and subcutaneous tissue 07/07/2017   History of aortic valve replacement 2018   bicuspid AV   History of kidney stones 11/17/2016   History of sleep apnea    wt loss->gone   Hypothyroidism 10/30/2018   IDA (iron deficiency anemia) 11/2021   malabs d/t gastric bipass   Lesion of plantar nerve 04/04/2013   Lumbar stenosis with neurogenic claudication 01/08/2021   Major depressive disorder, recurrent ([place])    ECT in the remote past.  Patient disabled due to recurrent depression and chronic pain.   Migraine syndrome    Mixed dyslipidemia 10/30/2018   Morbid obesity ([place]) 10/27/2011   Nondisplaced fracture of medial  cuneiform of left foot, initial encounter for closed fracture 11/16/2018   Osteoarthritis 10/30/2018   Personal history of tobacco use, presenting hazards to health 04/04/2013   Presence of permanent cardiac pacemaker    Severe single current episode of major depressive disorder, without psychotic features ([place]) 11/17/2016   Status post bariatric surgery 04/05/2011   bipass surg --presurg wt 310 lbs   Syncope 02/14/2019   Vitamin D deficiency 10/30/2018   Past Surgical History:  Procedure Laterality Date   ANTERIOR CERVICAL DECOMP/DISCECTOMY FUSION N/A 11/17/2018   Procedure: CERVICAL  THREE-CERVICAL FOUR, CERVICAL FOUR-CERVICAL FIVE, CERVICAL FIVE-CERVICAL SIX ANTERIOR CERVICAL DECOMPRESSION/DISCECTOMY FUSION;  Surgeon: Earnie Larsson, MD;  Location: [place];  Service: Neurosurgery;  Laterality: N/A;   AORTIC VALVE REPLACEMENT  [id number]   APPLICATION OF WOUND VAC Left 04/05/2019   Procedure: Application Of Wound Vac;  Surgeon: Erle [place], MD;  Location: [place];  Service: Orthopedics;  Laterality: Left;   BACK SURGERY     x4   CARDIOVASCULAR STRESS TEST  05/05/2021   lexiscan neg   FASCIOTOMY Left 04/05/2019   Left leg 2 compartment fasciotomy   FASCIOTOMY Left 04/05/2019   Procedure: FASCIOTOMY LEFT LOWER LEG;  Surgeon: Erle [place], MD;  Location: [place];  Service: Orthopedics;  Laterality: Left;   FOOT NEUROMA SURGERY     GASTRIC BYPASS  2012   HEMATOMA EVACUATION Left 04/05/2019   Procedure: Evacuation Hematoma Left Lower Leg;  Surgeon: Erle [place], MD;  Location: [place];  Service: Orthopedics;  Laterality: Left;   HEMORRHOID SURGERY     over 30 years ago   [place] umb   I & D EXTREMITY Left 04/13/2022   Procedure: IRRIGATION AND DEBRIDEMENT OF ELBOW AND BURSECTOMY;  Surgeon: Iran Planas, MD;  Location: [place];  Service: Orthopedics;  Laterality: Left;   LUMBAR LAMINECTOMY/DECOMPRESSION MICRODISCECTOMY N/A 01/08/2021   Procedure: CENTRAL LUMBAR LAMINECTOMY LUMBAR TWO-THREE, LUMBAR THREE-FOUR;  Surgeon: Jessy Oto, MD;  Location: [place];  Service: Orthopedics;  Laterality: N/A;   NASAL SINUS SURGERY     x2   NECK SURGERY     OLECRANON BURSECTOMY  2023   d/t septic bursitis   TRANSTHORACIC ECHOCARDIOGRAM  12/2020   EF normal, mod MR, ascending AA (4.4 cm)   Patient Active Problem List   Diagnosis Date Noted   Cervical dystonia 08/25/2022   Postlaminectomy syndrome, cervical region 08/25/2022   Septic bursitis 04/16/2022   Right bundle branch block 04/15/2022   Orthostasis 04/15/2022   History of aortic stenosis 04/13/2022    Septic bursitis of elbow 04/12/2022   Diabetes mellitus type 2 in nonobese ([place]) 04/16/2021   Lumbar stenosis with neurogenic claudication 01/08/2021   Back pain 01/05/2021   BPH (benign prostatic hyperplasia)    Anxiety and depression    Hypertension associated with diabetes ([place])    Presence of permanent cardiac pacemaker 12/28/2019   Ascending aorta dilation ([place]) 03/06/2019   Cervical myelopathy ([place]) 11/14/2018   Hyperlipidemia associated with type 2 diabetes mellitus ([place]) 10/30/2018   Vitamin D deficiency 10/30/2018   Osteoarthritis 10/30/2018   GERD (gastroesophageal reflux disease) 10/30/2018   Hypothyroidism 10/30/2018   Essential hypertension 10/30/2018   Mixed dyslipidemia 10/30/2018   Hemangioma of skin and subcutaneous tissue 07/07/2017   Lentigo 07/07/2017   Multiple benign melanocytic nevi 07/07/2017   Complete heart block ([place]) 05/04/2017   S/P AVR (aortic valve replacement) 03/10/2017   Bicuspid aortic valve 12/22/2016   Benign  prostatic hyperplasia without lower urinary tract symptoms 11/17/2016   Chronic inflammatory arthritis 11/17/2016   Chronic bilateral low back pain 11/17/2016   Heart murmur 11/17/2016   History of kidney stones 11/17/2016   Anxiety 06/20/2013   Personal history of tobacco use, presenting hazards to health 04/04/2013   Neck pain 04/04/2013   Status post bariatric surgery 04/04/2013   FHx: migraine headaches 04/04/2013   DM2 (diabetes mellitus, type 2) ([place]) 04/03/2013   Chronic pain 04/03/2013   Glaucoma 04/03/2013   Morbid obesity ([place]) 10/27/2011    ONSET DATE: several years   REFERRING DIAG: G24.3 (ICD-10-CM) - Cervical dystonia  THERAPY DIAG:  Abnormal posture  Cervicalgia  Muscle weakness (generalized)  Dizziness and giddiness  Unsteadiness on feet  Rationale for Evaluation and Treatment: Rehabilitation  SUBJECTIVE:                                                                                                                                                                                              SUBJECTIVE STATEMENT: Wife reports BP is much better today. Pt reports feeling a little better.   Pt accompanied by: wife  PERTINENT HISTORY: Pacemaker, anxiety and depression, bradycardia, cervical myelopathy, charcot's foot, DMII, HTN, migraines, HLD, C3-6, ACDF 2020, L LE fasciotomy 2020, L2-4 laminectomy 2022; pt reports "bad wrist" on R side from cyst that has been removed and has come back  PAIN:  Are you having pain? Yes: NPRS scale: 7/10 Pain location: R shoulders and down the arm and into shoulderblade Pain description: throbbing Aggravating factors: movement Relieving factors: pain meds, lidoderm patches  PRECAUTIONS: ICD/Pacemaker  WEIGHT BEARING RESTRICTIONS: No  FALLS: Has patient fallen in last 6 months? Yes. Number of falls wife reports multiple falls  LIVING ENVIRONMENT: Lives with: lives with their spouse Lives in: House/apartment; condo Stairs:  1 step to enter Has following equipment at home: Single point cane, Environmental consultant - 2 wheeled, Wheelchair (manual), Electronics engineer, and Grab bars  PLOF: Independent with basic ADLs  PATIENT GOALS: improve neck pain  OBJECTIVE:      TODAY'S TREATMENT: 10/15/22 Activity Comments  Vitals at start of session  142/96 mmHg, 60 bpm   Berg  37/56  vitals 111/49mHg, 60 bpm   Skilled palpation and monitoring of tissues during TPDN STM and manual TPR to R UT, LS, rhomboids, and thoracic paraspinals; significant soft tissue restriction particularly in rhomboids and thoracic paraspinals   Sitting scap retraction 10x3"   L cervical sidebending AROM 10x3" To tolerance; cueing to avoid compensating with trunk       Trigger Point Dry-Needling  Treatment instructions: Expect mild to  moderate muscle soreness. S/S of pneumothorax if dry needled over a lung field, and to seek immediate medical attention should they occur. Patient verbalized  understanding of these instructions and education.  Patient Consent Given: Yes Education handout provided: Previously provided Muscles treated: R UT and LS; using standard technique  Electrical stimulation performed: No Parameters: N/A Treatment response/outcome: pt reported improvement   [facility] PT Assessment - 10/15/22 0001       Standardized Balance Assessment   Standardized Balance Assessment Berg Balance Test      Berg Balance Test   Sit to Stand Able to stand  independently using hands    Standing Unsupported Able to stand safely 2 minutes    Sitting with Back Unsupported but Feet Supported on Floor or Stool Able to sit safely and securely 2 minutes    Stand to Sit Controls descent by using hands    Transfers Able to transfer safely, definite need of hands    Standing Unsupported with Eyes Closed Able to stand 10 seconds with supervision    Standing Unsupported with Feet Together Able to place feet together independently and stand for 1 minute with supervision    From Standing, Reach Forward with Outstretched Arm Can reach forward >12 cm safely (5")    From Standing Position, Pick up Object from [place] to pick up shoe safely and easily   c/o lightheadedness   From Standing Position, Turn to Look Behind Over each Shoulder Turn sideways only but maintains balance    Turn 360 Degrees Needs close supervision or verbal cueing    Standing Unsupported, Alternately Place Feet on Step/Stool Able to complete >2 steps/needs minimal assist   completed 6 reps before mod A required to prevent LOB   Standing Unsupported, One Foot in Front Able to take small step independently and hold 30 seconds    Standing on One Leg Tries to lift leg/unable to hold 3 seconds but remains standing independently    Total Score 37               HOME EXERCISE PROGRAM Last updated: 10/15/22 Access Code: [email or web address] URL: [email or web address] Date: 10/15/2022 Prepared by: [facility]  Exercises - Seated Cervical Rotation AROM  - 1 x daily - 5 x weekly - 2 sets - 10 reps - Seated Cervical Extension AROM  - 1 x daily - 5 x weekly - 2 sets - 10 reps - Seated Cervical Sidebending AROM  - 1 x daily - 5 x weekly - 2 sets - 10 reps - Seated Scapular Retraction  - 1 x daily - 5 x weekly - 2 sets - 10 reps - 3 sec hold - Putty Squeezes  - 2 x daily - 7 x weekly - Seated Shoulder Row with Anchored Resistance  - 1 x daily - 5 x weekly - 2 sets - 10 reps - Seated Shoulder Extension and Scapular Retraction with Resistance  - 1 x daily - 5 x weekly - 2 sets - 10 reps - Narrow Stance with Counter Support  - 1 x daily - 5 x weekly - 2-3 sets - 30 sec hold - Standing Toe Taps  - 1 x daily - 5 x weekly - 2 sets - 10 reps    PATIENT EDUCATION: Education details: edu on dry needling benefits, precautions, and possible side effects; encouraged use of MHP to neck and move as normal; HEP update- at counter Person educated: Patient and Spouse  Education method: Explanation, Demonstration, Tactile cues, Verbal cues, and Handouts Education comprehension: verbalized understanding and returned demonstration     Below measures were taken at time of initial evaluation unless otherwise specified:   DIAGNOSTIC FINDINGS: 06/22/22 cervical MRI: Stable postoperative changes from C3-C6 with anterior and interbody fusion hardware.  Stable degenerative subluxation at C6-7 with a bulging uncovered Disc. Small foci of increased T2 signal intensity in the spinal cord at C3-4 suggesting chronic ischemic changes/myelomalacia  COGNITION: Overall cognitive status: Within functional limits for tasks assessed   SENSATION: Diminished sensation in R arm and 5th digit, R thigh and foot  COORDINATION: Alternating pronation/supination: slow B Alternating toe tap: WNL B Finger to nose: WNL B   POSTURE: resting in R cervical sidebending and neck flexion; rounded shoulders and  slight thoracic kyphosis  Resting in 17 deg flexion, 15 deg R sidebending   CERVICAL ROM:   Active ROM A/PROM (deg) eval  Flexion 45 *pain  Extension 4 *pain  Right lateral flexion 28 *pain  Left lateral flexion -10 *pain  Right rotation 37 * pain  Left rotation 40 *pain   (Blank rows = not tested)   CERVICAL MMT:   MMT /5  Flexion 3-  Extension 3-  Right lateral flexion 3-  Left lateral flexion 3-  Right rotation 3-  Left rotation 3-   (Blank rows = not tested)  UPPER EXTREMITY MMT:  MMT Right eval Left eval  Shoulder flexion 4- 4-  Shoulder extension    Shoulder abduction 3+ 4  Shoulder adduction    Shoulder extension    Shoulder internal rotation 3+ 3+  Shoulder external rotation 3+ 3+  Middle trapezius    Lower trapezius    Elbow flexion 3+ 3+  Elbow extension 3- 3-  Wrist flexion 3- 3-  Wrist extension 3- 3-  Wrist ulnar deviation    Wrist radial deviation    Wrist pronation    Wrist supination    Grip strength 10 (avg of 3) 63.3 (avg of 3)   (Blank rows = not tested)    GAIT: Gait pattern:  slow and unsteady; wife assists in getting pt out of chair and guards as he takes off jacket d/t unsteadiness Assistive device utilized: Single point cane Level of assistance: CGA    TODAY'S TREATMENT:                                                                                                                              DATE: 10/06/22    PATIENT EDUCATION: Education details: prognosis, POC, HEP; edu on benefits of working on addressing dizziness and balance in conjunction with cervical spine; provided yellow putty Person educated: Patient and Spouse Education method: Explanation, Demonstration, Tactile cues, Verbal cues, and Handouts Education comprehension: verbalized understanding  HOME EXERCISE PROGRAM: Access Code: [email or web address] URL: [email or web address] Date: 10/06/2022 Prepared by: [facility]  Exercises - Seated Cervical Rotation AROM  -  1 x daily - 5 x weekly - 2 sets - 10 reps - Seated Cervical Extension AROM  - 1 x daily - 5 x weekly - 2 sets - 10 reps - Seated Cervical Sidebending AROM  - 1 x daily - 5 x weekly - 2 sets - 10 reps - Seated Scapular Retraction  - 1 x daily - 5 x weekly - 2 sets - 10 reps - 3 sec hold - Putty Squeezes  - 2 x daily - 7 x weekly    GOALS: Goals reviewed with patient? Yes  SHORT TERM GOALS: Target date: 10/27/2022  Patient to be independent with initial HEP. Baseline: HEP initiated Goal status: MET 10/08/22    LONG TERM GOALS: Target date: 11/17/2022  Patient to be independent with advanced HEP. Baseline: Not yet initiated  Goal status: IN PROGRESS  Patient to demonstrate B UE and cervical strength >/=4/5.  Baseline: See above Goal status: IN PROGRESS  Patient to demonstrate resting posture of 7 degrees cervical flexion, 5 degrees R side bending for improved positioning and decreased pain.  Baseline: Resting in 17 deg flexion, 15 deg R sidebending Goal status: IN PROGRESS  Patient to demonstrate cervical AROM WFL and without pain limiting.  Baseline: see above Goal status: IN PROGRESS  Patient to report/demonstrate knowledge of self-management of neck pain at home.  Baseline: Unable Goal status: IN PROGRESS  Patient to score at least 40/56 on Berg in order to decrease risk of falls.  Baseline: NT Goal status: IN PROGRESS   ASSESSMENT:  CLINICAL IMPRESSION: Patient arrived to session with slightly decreased pain levels compared to last session. Vitals revealed elevated BP however patient symptomatic and agreeable to proceed. Patient scored 37/56 on Berg, indicating and increased risk of falls. Provided HEP update to safety work on balance at home at counter top. Patient reported some lightheadedness during balance testing- vitals revealed significant drop in BP. After educating patient on dry needling benefits,  precautions, and possible side effects and receiving verbal consent, patient tolerated DN to R UT and LS. Reported good benefit. Ended session with gentle cervical and periscapular stretching/strengthening. NO complaints at end of session.    OBJECTIVE IMPAIRMENTS: Abnormal gait, decreased activity tolerance, decreased balance, difficulty walking, decreased ROM, decreased strength, dizziness, hypomobility, increased fascial restrictions, increased muscle spasms, impaired flexibility, impaired sensation, impaired UE functional use, improper body mechanics, postural dysfunction, and pain.   ACTIVITY LIMITATIONS: carrying, lifting, bending, standing, squatting, stairs, transfers, bathing, toileting, dressing, self feeding, reach over head, hygiene/grooming, and locomotion level  PARTICIPATION LIMITATIONS: meal prep, cleaning, laundry, shopping, community activity, and church  PERSONAL FACTORS: Age, Behavior pattern, Past/current experiences, Time since onset of injury/illness/exacerbation, and 3+ comorbidities: Pacemaker, anxiety and depression, bradycardia, cervical myelopathy, charcot's foot, DMII, HTN, migraines, HLD, C3-6, ACDF 2020, L LE fasciotomy 2020, L2-4 laminectomy 2022  are also affecting patient's functional outcome.   REHAB POTENTIAL: Good  CLINICAL DECISION MAKING: Evolving/moderate complexity  EVALUATION COMPLEXITY: Moderate  PLAN:  PT FREQUENCY: 2x/week  PT DURATION: 6 weeks  PLANNED INTERVENTIONS: Therapeutic exercises, Therapeutic activity, Neuromuscular re-education, Balance training, Gait training, Patient/Family education, Self Care, Joint mobilization, Stair training, Vestibular training, Canalith repositioning, Aquatic Therapy, Dry Needling, Cryotherapy, Moist heat, Taping, Manual therapy, and Re-evaluation  PLAN FOR NEXT SESSION: recheck BP; DN to R UT, LS, and possibly rhomboids and thoracic multifidi (could not get a good grip on rhomboids today) ;balance, STM and  passive stretching to cervical musculature    Janene Harvey, PT, DPT 10/15/22 9:39  AM  [facility] at [facility], [address], [place] [address] Phone # [phone] Fax # [phone]

## 2022-10-15 ENCOUNTER — Encounter: Payer: Self-pay | Admitting: Physical Therapy

## 2022-10-15 ENCOUNTER — Ambulatory Visit: Payer: Medicare PPO | Admitting: Physical Therapy

## 2022-10-15 DIAGNOSIS — R293 Abnormal posture: Secondary | ICD-10-CM

## 2022-10-15 DIAGNOSIS — M6281 Muscle weakness (generalized): Secondary | ICD-10-CM

## 2022-10-15 DIAGNOSIS — R2681 Unsteadiness on feet: Secondary | ICD-10-CM

## 2022-10-15 DIAGNOSIS — M542 Cervicalgia: Secondary | ICD-10-CM

## 2022-10-15 DIAGNOSIS — R42 Dizziness and giddiness: Secondary | ICD-10-CM

## 2022-10-18 ENCOUNTER — Ambulatory Visit: Payer: Medicare PPO

## 2022-10-18 DIAGNOSIS — I442 Atrioventricular block, complete: Secondary | ICD-10-CM

## 2022-10-19 ENCOUNTER — Ambulatory Visit: Payer: Medicare PPO | Admitting: Rehabilitative and Restorative Service Providers"

## 2022-10-19 LAB — CUP PACEART REMOTE DEVICE CHECK
Battery Remaining Longevity: 70 mo
Battery Remaining Percentage: 54 %
Battery Voltage: 3.01 V
Brady Statistic AP VP Percent: 1 %
Brady Statistic AP VS Percent: 30 %
Brady Statistic AS VP Percent: 1 %
Brady Statistic AS VS Percent: 69 %
Brady Statistic RA Percent Paced: 30 %
Brady Statistic RV Percent Paced: 1 %
Date Time Interrogation Session: 20240212020014
Implantable Lead Connection Status: 753985
Implantable Lead Connection Status: 753985
Implantable Lead Implant Date: 20180710
Implantable Lead Implant Date: 20180710
Implantable Lead Location: 753859
Implantable Lead Location: 753860
Implantable Pulse Generator Implant Date: 20180710
Lead Channel Impedance Value: 430 Ohm
Lead Channel Impedance Value: 460 Ohm
Lead Channel Pacing Threshold Amplitude: 0.5 V
Lead Channel Pacing Threshold Amplitude: 0.75 V
Lead Channel Pacing Threshold Pulse Width: 0.4 ms
Lead Channel Pacing Threshold Pulse Width: 0.4 ms
Lead Channel Sensing Intrinsic Amplitude: 0.9 mV
Lead Channel Sensing Intrinsic Amplitude: 2 mV
Lead Channel Setting Pacing Amplitude: 1 V
Lead Channel Setting Pacing Amplitude: 2 V
Lead Channel Setting Pacing Pulse Width: 0.4 ms
Lead Channel Setting Sensing Sensitivity: 0.5 mV
Pulse Gen Model: 2272
Pulse Gen Serial Number: 8912213

## 2022-10-20 ENCOUNTER — Telehealth: Payer: Self-pay | Admitting: Cardiology

## 2022-10-20 NOTE — Telephone Encounter (Signed)
Spoke to [place], pt's wife. Over the last few weeks to 2-3 months pt's blood pressure has been extremely high. He has been taking Hydralazine if DBP  was over 95 and it was controlled. Today at 12:40 161/109 25 mg hydralazine at 3:46pm bp was 161/106 . DBP was has 106 twice daily for 4-5 days. At the end of the week he was very weak blood pressure was SBP 70/49. Pt was not able to do his physical therapy ever after some exercise his blood pressure was not high enough for him to complete treatment that day. She states "I do not know what to do at this point." Last week his HR was in the 90s. Normally he runs in the 39s. She wants to know what to do.

## 2022-10-20 NOTE — Telephone Encounter (Signed)
Pt c/o BP issue: STAT if pt c/o blurred vision, one-sided weakness or slurred speech  1. What are your last 5 BP readings? 161/109; 161/106 (treated with hydrALAZINE (APRESOLINE) 25 MG tablet )  2. Are you having any other symptoms (ex. Dizziness, headache, blurred vision, passed out)? Dizziness, headache, sensation of falling; one-sided weakness)  3. What is your BP issue? Experiencing high BP with symptoms listed

## 2022-10-21 NOTE — Telephone Encounter (Signed)
Spoke with pt wife, Follow up scheduled

## 2022-10-22 ENCOUNTER — Other Ambulatory Visit (HOSPITAL_COMMUNITY): Payer: Self-pay

## 2022-10-22 ENCOUNTER — Ambulatory Visit: Payer: Medicare PPO | Admitting: Physical Therapy

## 2022-10-22 ENCOUNTER — Encounter: Payer: Self-pay | Admitting: Physical Therapy

## 2022-10-22 DIAGNOSIS — R293 Abnormal posture: Secondary | ICD-10-CM

## 2022-10-22 DIAGNOSIS — M542 Cervicalgia: Secondary | ICD-10-CM

## 2022-10-22 DIAGNOSIS — R42 Dizziness and giddiness: Secondary | ICD-10-CM

## 2022-10-22 DIAGNOSIS — R2681 Unsteadiness on feet: Secondary | ICD-10-CM

## 2022-10-22 DIAGNOSIS — M6281 Muscle weakness (generalized): Secondary | ICD-10-CM

## 2022-10-22 NOTE — Therapy (Signed)
OUTPATIENT PHYSICAL THERAPY NEURO TREATMENT   Patient Name: Antonio Oliver MRN: [id number] DOB:02-17-1956, 67 y.o., male Today's Date: 10/22/2022   PCP: Cher Nakai, MD  REFERRING PROVIDER: Meredith Staggers, MD  END OF SESSION:  PT End of Session - 10/22/22 1013     Visit Number 5    Number of Visits 13    Date for PT Re-Evaluation 11/17/22    Authorization Type [employer]    Authorization Time Period approved 13 PT visits from 10/06/2022-11/17/2022    Authorization - Visit Number 5    Authorization - Number of Visits 13    PT Start Time 0935    PT Stop Time 1013    PT Time Calculation (min) 38 min    Activity Tolerance Patient tolerated treatment well    Behavior During Therapy [facility] for tasks assessed/performed                 Past Medical History:  Diagnosis Date   Anxiety and depression    Ascending aorta dilation ([place]) 03/06/2019   4.3 cm 2022   Benign prostatic hyperplasia without lower urinary tract symptoms 11/17/2016   Last Assessment & Plan:  Stable PSA today.  Formatting of this note might be different from the original. Last Assessment & Plan:  Stable PSA today. Last Assessment & Plan:  Formatting of this note might be different from the original. Stable PSA today.   BPH (benign prostatic hyperplasia)    Bradycardia 02/01/2019   Cervical myelopathy ([place]) 11/14/2018   Charcot's joint of foot 03/05/2019   Chronic back pain 04/03/2013   Chronic inflammatory arthritis 11/17/2016   History of positive rheumatoid factor in the past. Denies placement on immunosuppressive therapy   Chronic pain syndrome    Failed back surgical syndrome.  Chronic neck pain.  Chronic low back pain with sciatica bilateral   Colon cancer screening    2023 cologuard neg   Complete heart block (HCC)    Following AVR s/p St Jude PPM in 03/2017   DM2 (diabetes mellitus, type 2) ([place]) 04/03/2013   Last Assessment & Plan:  Formatting of this note might be different from the  original. Diabetes is unchanged.  Continue current treatment regimen. Regular aerobic exercise. Diabetes will be reassessed in 3 months. Will check A1c today. Denies any problems with feet or sensation.   Elevated PSA 11/2021   Enthesopathy of ankle and tarsus 04/04/2013   Last Assessment & Plan:  Continues with swelling after prolonged standing.  Still  Wearing support hose, which helps.   Essential hypertension 10/30/2018   FHx: migraine headaches 04/04/2013   Fracture of capitate bone of wrist 02/26/2019   Fracture of tibia 03/05/2019   GERD (gastroesophageal reflux disease) 10/30/2018   Glaucoma 04/03/2013   Hemangioma of skin and subcutaneous tissue 07/07/2017   History of aortic valve replacement 2018   bicuspid AV   History of kidney stones 11/17/2016   History of sleep apnea    wt loss->gone   Hypothyroidism 10/30/2018   IDA (iron deficiency anemia) 11/2021   malabs d/t gastric bipass   Lesion of plantar nerve 04/04/2013   Lumbar stenosis with neurogenic claudication 01/08/2021   Major depressive disorder, recurrent ([place])    ECT in the remote past.  Patient disabled due to recurrent depression and chronic pain.   Migraine syndrome    Mixed dyslipidemia 10/30/2018   Morbid obesity ([place]) 10/27/2011   Nondisplaced fracture of medial cuneiform of left foot, initial encounter for closed  fracture 11/16/2018   Osteoarthritis 10/30/2018   Personal history of tobacco use, presenting hazards to health 04/04/2013   Presence of permanent cardiac pacemaker    Severe single current episode of major depressive disorder, without psychotic features ([place]) 11/17/2016   Status post bariatric surgery 04/05/2011   bipass surg --presurg wt 310 lbs   Syncope 02/14/2019   Vitamin D deficiency 10/30/2018   Past Surgical History:  Procedure Laterality Date   ANTERIOR CERVICAL DECOMP/DISCECTOMY FUSION N/A 11/17/2018   Procedure: CERVICAL THREE-CERVICAL FOUR, CERVICAL FOUR-CERVICAL FIVE, CERVICAL  FIVE-CERVICAL SIX ANTERIOR CERVICAL DECOMPRESSION/DISCECTOMY FUSION;  Surgeon: Earnie Larsson, MD;  Location: [place];  Service: Neurosurgery;  Laterality: N/A;   AORTIC VALVE REPLACEMENT  [id number]   APPLICATION OF WOUND VAC Left 04/05/2019   Procedure: Application Of Wound Vac;  Surgeon: Erle Crocker, MD;  Location: [place];  Service: Orthopedics;  Laterality: Left;   BACK SURGERY     x4   CARDIOVASCULAR STRESS TEST  05/05/2021   lexiscan neg   FASCIOTOMY Left 04/05/2019   Left leg 2 compartment fasciotomy   FASCIOTOMY Left 04/05/2019   Procedure: FASCIOTOMY LEFT LOWER LEG;  Surgeon: Erle Crocker, MD;  Location: [place];  Service: Orthopedics;  Laterality: Left;   FOOT NEUROMA SURGERY     GASTRIC BYPASS  2012   HEMATOMA EVACUATION Left 04/05/2019   Procedure: Evacuation Hematoma Left Lower Leg;  Surgeon: Erle Crocker, MD;  Location: [place];  Service: Orthopedics;  Laterality: Left;   HEMORRHOID SURGERY     over 30 years ago   [place] umb   I & D EXTREMITY Left 04/13/2022   Procedure: IRRIGATION AND DEBRIDEMENT OF ELBOW AND BURSECTOMY;  Surgeon: Iran Planas, MD;  Location: [place];  Service: Orthopedics;  Laterality: Left;   LUMBAR LAMINECTOMY/DECOMPRESSION MICRODISCECTOMY N/A 01/08/2021   Procedure: CENTRAL LUMBAR LAMINECTOMY LUMBAR TWO-THREE, LUMBAR THREE-FOUR;  Surgeon: Jessy Oto, MD;  Location: [place];  Service: Orthopedics;  Laterality: N/A;   NASAL SINUS SURGERY     x2   NECK SURGERY     OLECRANON BURSECTOMY  2023   d/t septic bursitis   TRANSTHORACIC ECHOCARDIOGRAM  12/2020   EF normal, mod MR, ascending AA (4.4 cm)   Patient Active Problem List   Diagnosis Date Noted   Cervical dystonia 08/25/2022   Postlaminectomy syndrome, cervical region 08/25/2022   Septic bursitis 04/16/2022   Right bundle branch block 04/15/2022   Orthostasis 04/15/2022   History of aortic stenosis 04/13/2022   Septic bursitis of elbow 04/12/2022   Diabetes mellitus  type 2 in nonobese ([place]) 04/16/2021   Lumbar stenosis with neurogenic claudication 01/08/2021   Back pain 01/05/2021   BPH (benign prostatic hyperplasia)    Anxiety and depression    Hypertension associated with diabetes ([place])    Presence of permanent cardiac pacemaker 12/28/2019   Ascending aorta dilation ([place]) 03/06/2019   Cervical myelopathy ([place]) 11/14/2018   Hyperlipidemia associated with type 2 diabetes mellitus ([place]) 10/30/2018   Vitamin D deficiency 10/30/2018   Osteoarthritis 10/30/2018   GERD (gastroesophageal reflux disease) 10/30/2018   Hypothyroidism 10/30/2018   Essential hypertension 10/30/2018   Mixed dyslipidemia 10/30/2018   Hemangioma of skin and subcutaneous tissue 07/07/2017   Lentigo 07/07/2017   Multiple benign melanocytic nevi 07/07/2017   Complete heart block ([place]) 05/04/2017   S/P AVR (aortic valve replacement) 03/10/2017   Bicuspid aortic valve 12/22/2016   Benign prostatic hyperplasia without lower urinary tract symptoms 11/17/2016  Chronic inflammatory arthritis 11/17/2016   Chronic bilateral low back pain 11/17/2016   Heart murmur 11/17/2016   History of kidney stones 11/17/2016   Anxiety 06/20/2013   Personal history of tobacco use, presenting hazards to health 04/04/2013   Neck pain 04/04/2013   Status post bariatric surgery 04/04/2013   FHx: migraine headaches 04/04/2013   DM2 (diabetes mellitus, type 2) ([place]) 04/03/2013   Chronic pain 04/03/2013   Glaucoma 04/03/2013   Morbid obesity ([place]) 10/27/2011    ONSET DATE: several years   REFERRING DIAG: G24.3 (ICD-10-CM) - Cervical dystonia  THERAPY DIAG:  Abnormal posture  Cervicalgia  Muscle weakness (generalized)  Dizziness and giddiness  Unsteadiness on feet  Rationale for Evaluation and Treatment: Rehabilitation  SUBJECTIVE:                                                                                                                                                                                              SUBJECTIVE STATEMENT:  Feeling rough, BP is back to extreme fluctuations; there is no normal. This this morning was 181/118 and we treated with meds from MD. The medical team can't figure out what's going on, cardiology and neurology can't find anything so far. Neck is hurting today. Not moving helps neck the most, but it throbs when I sit still, I really don't know if I should move or sit still.   Pt accompanied by: wife  PERTINENT HISTORY: Pacemaker, anxiety and depression, bradycardia, cervical myelopathy, charcot's foot, DMII, HTN, migraines, HLD, C3-6, ACDF 2020, L LE fasciotomy 2020, L2-4 laminectomy 2022; pt reports "bad wrist" on R side from cyst that has been removed and has come back  PAIN:  Are you having pain? Yes: NPRS scale: 7/10 Pain location: R shoulders and down the arm and into shoulder blade, R side of neck as well, down back, having low back pain as well  Pain description: neck and R shoulder throbbing, lower back constant pain/discomfort  Aggravating factors: movement Relieving factors: pain meds, lidoderm patches  PRECAUTIONS: ICD/Pacemaker  WEIGHT BEARING RESTRICTIONS: No  FALLS: Has patient fallen in last 6 months? Yes. Number of falls wife reports multiple falls  LIVING ENVIRONMENT: Lives with: lives with their spouse Lives in: House/apartment; condo Stairs:  1 step to enter Has following equipment at home: Single point cane, Environmental consultant - 2 wheeled, Wheelchair (manual), Electronics engineer, and Grab bars  PLOF: Independent with basic ADLs  PATIENT GOALS: improve neck pain  OBJECTIVE:      TODAY'S TREATMENT:    10/22/22  BP 120/79 HR 59  Manual  MFR R scalenes, R upper trap, R levator,  R rhomboids, R rotator cuff, R pec, R subscap   TherEx  Cervical flexor stretches supine 3x30 seconds R UT stretch with OP as tolerated from PT 3x30 seconds  Pec stretch in supine 3x30 seconds R Pec stretch with ER 3x30 seconds R      10/15/22 Activity Comments  Vitals at start of session  142/96 mmHg, 60 bpm   Berg  37/56  vitals 111/61mHg, 60 bpm   Skilled palpation and monitoring of tissues during TPDN STM and manual TPR to R UT, LS, rhomboids, and thoracic paraspinals; significant soft tissue restriction particularly in rhomboids and thoracic paraspinals   Sitting scap retraction 10x3"   L cervical sidebending AROM 10x3" To tolerance; cueing to avoid compensating with trunk       Trigger Point Dry-Needling  Treatment instructions: Expect mild to moderate muscle soreness. S/S of pneumothorax if dry needled over a lung field, and to seek immediate medical attention should they occur. Patient verbalized understanding of these instructions and education.  Patient Consent Given: Yes Education handout provided: Previously provided Muscles treated: R UT and LS; using standard technique  Electrical stimulation performed: No Parameters: N/A Treatment response/outcome: pt reported improvement       HOME EXERCISE PROGRAM Last updated: 10/15/22 Access Code: [email or web address] URL: [email or web address] Date: 10/15/2022 Prepared by: [facility]  Exercises - Seated Cervical Rotation AROM  - 1 x daily - 5 x weekly - 2 sets - 10 reps - Seated Cervical Extension AROM  - 1 x daily - 5 x weekly - 2 sets - 10 reps - Seated Cervical Sidebending AROM  - 1 x daily - 5 x weekly - 2 sets - 10 reps - Seated Scapular Retraction  - 1 x daily - 5 x weekly - 2 sets - 10 reps - 3 sec hold - Putty Squeezes  - 2 x daily - 7 x weekly - Seated Shoulder Row with Anchored Resistance  - 1 x daily - 5 x weekly - 2 sets - 10 reps - Seated Shoulder Extension and Scapular Retraction with Resistance  - 1 x daily - 5 x weekly - 2 sets - 10 reps - Narrow Stance with Counter Support  - 1 x daily - 5 x weekly - 2-3 sets - 30 sec hold - Standing Toe Taps  - 1 x daily - 5 x weekly - 2 sets - 10  reps    PATIENT EDUCATION: Education details: edu on dry needling benefits, precautions, and possible side effects; encouraged use of MHP to neck and move as normal; HEP update- at counter Person educated: Patient and Spouse Education method: Explanation, Demonstration, Tactile cues, Verbal cues, and Handouts Education comprehension: verbalized understanding and returned demonstration     Below measures were taken at time of initial evaluation unless otherwise specified:   DIAGNOSTIC FINDINGS: 06/22/22 cervical MRI: Stable postoperative changes from C3-C6 with anterior and interbody fusion hardware.  Stable degenerative subluxation at C6-7 with a bulging uncovered Disc. Small foci of increased T2 signal intensity in the spinal cord at C3-4 suggesting chronic ischemic changes/myelomalacia  COGNITION: Overall cognitive status: Within functional limits for tasks assessed   SENSATION: Diminished sensation in R arm and 5th digit, R thigh and foot  COORDINATION: Alternating pronation/supination: slow B Alternating toe tap: WNL B Finger to nose: WNL B   POSTURE: resting in R cervical sidebending and neck flexion; rounded shoulders and slight thoracic kyphosis  Resting in 17 deg flexion, 15 deg  R sidebending   CERVICAL ROM:   Active ROM A/PROM (deg) eval  Flexion 45 *pain  Extension 4 *pain  Right lateral flexion 28 *pain  Left lateral flexion -10 *pain  Right rotation 37 * pain  Left rotation 40 *pain   (Blank rows = not tested)   CERVICAL MMT:   MMT /5  Flexion 3-  Extension 3-  Right lateral flexion 3-  Left lateral flexion 3-  Right rotation 3-  Left rotation 3-   (Blank rows = not tested)  UPPER EXTREMITY MMT:  MMT Right eval Left eval  Shoulder flexion 4- 4-  Shoulder extension    Shoulder abduction 3+ 4  Shoulder adduction    Shoulder extension    Shoulder internal rotation 3+ 3+  Shoulder external rotation 3+ 3+  Middle trapezius    Lower  trapezius    Elbow flexion 3+ 3+  Elbow extension 3- 3-  Wrist flexion 3- 3-  Wrist extension 3- 3-  Wrist ulnar deviation    Wrist radial deviation    Wrist pronation    Wrist supination    Grip strength 10 (avg of 3) 63.3 (avg of 3)   (Blank rows = not tested)    GAIT: Gait pattern:  slow and unsteady; wife assists in getting pt out of chair and guards as he takes off jacket d/t unsteadiness Assistive device utilized: Single point cane Level of assistance: CGA    TODAY'S TREATMENT:                                                                                                                              DATE: 10/06/22    PATIENT EDUCATION: Education details: prognosis, POC, HEP; edu on benefits of working on addressing dizziness and balance in conjunction with cervical spine; provided yellow putty Person educated: Patient and Spouse Education method: Explanation, Demonstration, Tactile cues, Verbal cues, and Handouts Education comprehension: verbalized understanding  HOME EXERCISE PROGRAM: Access Code: [email or web address] URL: [email or web address] Date: 10/06/2022 Prepared by: [facility]  Exercises - Seated Cervical Rotation AROM  - 1 x daily - 5 x weekly - 2 sets - 10 reps - Seated Cervical Extension AROM  - 1 x daily - 5 x weekly - 2 sets - 10 reps - Seated Cervical Sidebending AROM  - 1 x daily - 5 x weekly - 2 sets - 10 reps - Seated Scapular Retraction  - 1 x daily - 5 x weekly - 2 sets - 10 reps - 3 sec hold - Putty Squeezes  - 2 x daily - 7 x weekly    GOALS: Goals reviewed with patient? Yes  SHORT TERM GOALS: Target date: 10/27/2022  Patient to be independent with initial HEP. Baseline: HEP initiated Goal status: MET 10/08/22    LONG TERM GOALS: Target date: 11/17/2022  Patient to be independent with advanced HEP. Baseline: Not yet initiated  Goal status: IN PROGRESS  Patient to demonstrate B UE and cervical  strength >/=4/5.  Baseline: See above Goal status: IN PROGRESS  Patient to demonstrate resting posture of 7 degrees cervical flexion, 5 degrees R side bending for improved positioning and decreased pain.  Baseline: Resting in 17 deg flexion, 15 deg R sidebending Goal status: IN PROGRESS  Patient to demonstrate cervical AROM WFL and without pain limiting.  Baseline: see above Goal status: IN PROGRESS  Patient to report/demonstrate knowledge of self-management of neck pain at home.  Baseline: Unable Goal status: IN PROGRESS  Patient to score at least 40/56 on Berg in order to decrease risk of falls.  Baseline: NT Goal status: IN PROGRESS   ASSESSMENT:  CLINICAL IMPRESSION:  Rya arrives doing OK, unfortunately BP is back to having extreme fluctuations in general but was WNL in sitting during today's session. Continued working on soft tissue interventions (MFR today as this therapist is not trained in DN) as well as general techniques for cervical mobility. Will continue efforts.    OBJECTIVE IMPAIRMENTS: Abnormal gait, decreased activity tolerance, decreased balance, difficulty walking, decreased ROM, decreased strength, dizziness, hypomobility, increased fascial restrictions, increased muscle spasms, impaired flexibility, impaired sensation, impaired UE functional use, improper body mechanics, postural dysfunction, and pain.   ACTIVITY LIMITATIONS: carrying, lifting, bending, standing, squatting, stairs, transfers, bathing, toileting, dressing, self feeding, reach over head, hygiene/grooming, and locomotion level  PARTICIPATION LIMITATIONS: meal prep, cleaning, laundry, shopping, community activity, and church  PERSONAL FACTORS: Age, Behavior pattern, Past/current experiences, Time since onset of injury/illness/exacerbation, and 3+ comorbidities: Pacemaker, anxiety and depression, bradycardia, cervical myelopathy, charcot's foot, DMII, HTN, migraines, HLD, C3-6, ACDF 2020, L LE  fasciotomy 2020, L2-4 laminectomy 2022  are also affecting patient's functional outcome.   REHAB POTENTIAL: Good  CLINICAL DECISION MAKING: Evolving/moderate complexity  EVALUATION COMPLEXITY: Moderate  PLAN:  PT FREQUENCY: 2x/week  PT DURATION: 6 weeks  PLANNED INTERVENTIONS: Therapeutic exercises, Therapeutic activity, Neuromuscular re-education, Balance training, Gait training, Patient/Family education, Self Care, Joint mobilization, Stair training, Vestibular training, Canalith repositioning, Aquatic Therapy, Dry Needling, Cryotherapy, Moist heat, Taping, Manual therapy, and Re-evaluation  PLAN FOR NEXT SESSION: recheck BP; DN to R UT, LS, and possibly rhomboids and thoracic multifidi (could not get a good grip on rhomboids today) ;balance, STM and passive stretching to cervical musculature    Deniece Ree PT DPT PN2   [facility] at [facility] [address], [place], [place] [address] Phone # [phone] Fax # [phone]

## 2022-10-24 NOTE — Progress Notes (Unsigned)
Cardiology Office Note:    Date:  10/25/2022   ID:  Antonio Oliver, DOB 12-19-1955, MRN [id number]  PCP:  Cher Nakai, [place] Prov[facility] Cardiologist:  Kirk Ruths, MD Electrophysiologist:  [name], MD     Referring MD: Cher Nakai, MD   Chief Complaint: labile BP  History of Present Illness:    Antonio Oliver is a 67 y.o. male with a hx of hypertension, bicuspid aortic valve (s/p AVR 2018), complete heart block following his AVR in 2018 (PPM), thoracic aortic aneurysm, ascending aorta dilatation, right bundle branch block, GERD, DM2, HLD, hypothyroidism, BPH, anxiety, depression, dyslipidemia, septic bursitis, cervical myelopathy, chronic pain.  Patient has a history of bicuspid aortic valve, underwent AVR in [id number], procedure complicated by CHB requiring PPM.  He had a nuclear study in 2022, that showed no ischemia.  Repeat echocardiogram in 2023 showed EF 60 to 65%, moderate hypertrophy of the basal septum, the remainder of the LV segment demonstrated mild LVH.  Mitral valve is abnormal, thickening of the MV leaflets, moderate, posteriorly directed mitral valve regurgitation.  Trivial AI.  Aortic dilatation noted, borderline dilatation of the aortic root measuring 37 mm, mild dilatation of the ascending aorta measuring 43 mm.  Renal Dopplers in 2023 showed 1 to 59% stenosis on the right kidney, no stenosis on the left.  Most recently evaluated in our office with Dr. [name] on 08/02/2022, at that time he continued to have episodes of dizziness, near syncope followed by weakness.  His blood pressure was elevated in the office 166/96, however he reported that at home his blood pressure was much lower.  He was being evaluated by neurology for his dizzy spells, and they were not felt to be cardiac in nature.  He has been evaluated by neurology for ongoing dizziness and occasional syncopal episodes, it was felt to be related to dysautonomia, orthostatic  hypotension, parkinsonism possibly secondary to [place].   Antonio Oliver wife called our triage line to report elevated blood pressure readings over the last few months.  He has been taking hydralazine if his diastolic has been greater than 95, he has had several instances where his diastolic has been greater than 100.  At the end of the week he was found with complaints of weakness and his blood pressure was 70/40.  Instructed to follow-up in the office and bring the blood pressure cuff from home.  He presents today for follow up in wheelchair due to profound weakness. BP today in clinic 54/40. Will need evaluation in the Emergency Department.   Past Medical History:  Diagnosis Date   Anxiety and depression    Ascending aorta dilation ([place]) 03/06/2019   4.3 cm 2022   Benign prostatic hyperplasia without lower urinary tract symptoms 11/17/2016   Last Assessment & Plan:  Stable PSA today.  Formatting of this note might be different from the original. Last Assessment & Plan:  Stable PSA today. Last Assessment & Plan:  Formatting of this note might be different from the original. Stable PSA today.   BPH (benign prostatic hyperplasia)    Bradycardia 02/01/2019   Cervical myelopathy (Antonio Oliver) 11/14/2018   Charcot's joint of foot 03/05/2019   Chronic back pain 04/03/2013   Chronic inflammatory arthritis 11/17/2016   History of positive rheumatoid factor in the past. Denies placement on immunosuppressive therapy   Chronic pain syndrome    Failed back surgical syndrome.  Chronic neck pain.  Chronic low back pain with sciatica bilateral  Colon cancer screening    2023 cologuard neg   Complete heart block (Antonio Oliver)    Following AVR s/p St Jude PPM in 03/2017   DM2 (diabetes mellitus, type 2) ([place]) 04/03/2013   Last Assessment & Plan:  Formatting of this note might be different from the original. Diabetes is unchanged.  Continue current treatment regimen. Regular aerobic exercise. Diabetes will be reassessed  in 3 months. Will check A1c today. Denies any problems with feet or sensation.   Elevated PSA 11/2021   Enthesopathy of ankle and tarsus 04/04/2013   Last Assessment & Plan:  Continues with swelling after prolonged standing.  Still  Wearing support hose, which helps.   Essential hypertension 10/30/2018   FHx: migraine headaches 04/04/2013   Fracture of capitate bone of wrist 02/26/2019   Fracture of tibia 03/05/2019   GERD (gastroesophageal reflux disease) 10/30/2018   Glaucoma 04/03/2013   Hemangioma of skin and subcutaneous tissue 07/07/2017   History of aortic valve replacement 2018   bicuspid AV   History of kidney stones 11/17/2016   History of sleep apnea    wt loss->gone   Hypothyroidism 10/30/2018   IDA (iron deficiency anemia) 11/2021   malabs d/t gastric bipass   Lesion of plantar nerve 04/04/2013   Lumbar stenosis with neurogenic claudication 01/08/2021   Major depressive disorder, recurrent ([place])    ECT in the remote past.  Patient disabled due to recurrent depression and chronic pain.   Migraine syndrome    Mixed dyslipidemia 10/30/2018   Morbid obesity ([place]) 10/27/2011   Nondisplaced fracture of medial cuneiform of left foot, initial encounter for closed fracture 11/16/2018   Osteoarthritis 10/30/2018   Personal history of tobacco use, presenting hazards to health 04/04/2013   Presence of permanent cardiac pacemaker    Severe single current episode of major depressive disorder, without psychotic features (Antonio Oliver) 11/17/2016   Status post bariatric surgery 04/05/2011   bipass surg --presurg wt 310 lbs   Syncope 02/14/2019   Vitamin D deficiency 10/30/2018    Past Surgical History:  Procedure Laterality Date   ANTERIOR CERVICAL DECOMP/DISCECTOMY FUSION N/A 11/17/2018   Procedure: CERVICAL THREE-CERVICAL FOUR, CERVICAL FOUR-CERVICAL FIVE, CERVICAL FIVE-CERVICAL SIX ANTERIOR CERVICAL DECOMPRESSION/DISCECTOMY FUSION;  Surgeon: Earnie Larsson, MD;  Location: [facility];  Service:  Neurosurgery;  Laterality: N/A;   AORTIC VALVE REPLACEMENT  99991111   APPLICATION OF WOUND VAC Left 04/05/2019   Procedure: Application Of Wound Vac;  Surgeon: Erle Crocker, MD;  Location: [facility];  Service: Orthopedics;  Laterality: Left;   BACK SURGERY     x4   CARDIOVASCULAR STRESS TEST  05/05/2021   lexiscan neg   FASCIOTOMY Left 04/05/2019   Left leg 2 compartment fasciotomy   FASCIOTOMY Left 04/05/2019   Procedure: FASCIOTOMY LEFT LOWER LEG;  Surgeon: Erle Crocker, MD;  Location: [facility];  Service: Orthopedics;  Laterality: Left;   FOOT NEUROMA SURGERY     GASTRIC BYPASS  2012   HEMATOMA EVACUATION Left 04/05/2019   Procedure: Evacuation Hematoma Left Lower Leg;  Surgeon: Erle Crocker, MD;  Location: [facility];  Service: Orthopedics;  Laterality: Left;   HEMORRHOID SURGERY     over 30 years ago   [place] umb   I & D EXTREMITY Left 04/13/2022   Procedure: IRRIGATION AND DEBRIDEMENT OF ELBOW AND BURSECTOMY;  Surgeon: Iran Planas, MD;  Location: [place];  Service: Orthopedics;  Laterality: Left;   LUMBAR LAMINECTOMY/DECOMPRESSION MICRODISCECTOMY N/A 01/08/2021  Procedure: CENTRAL LUMBAR LAMINECTOMY LUMBAR TWO-THREE, LUMBAR THREE-FOUR;  Surgeon: Jessy Oto, MD;  Location: [place] [facility];  Service: Orthopedics;  Laterality: N/A;   NASAL SINUS SURGERY     x2   NECK SURGERY     OLECRANON BURSECTOMY  2023   d/t septic bursitis   TRANSTHORACIC ECHOCARDIOGRAM  12/2020   EF normal, mod MR, ascending AA (4.4 cm)    Current Medications: Current Meds  Medication Sig   aspirin EC 81 MG tablet Take 81 mg by mouth daily.   atorvastatin (LIPITOR) 20 MG tablet Take 20 mg by mouth daily at 6 PM.    brexpiprazole (REXULTI) 2 MG TABS tablet Take 2 mg by mouth daily.   CALCIUM CITRATE PO Take 500 mg by mouth 2 (two) times daily.   Cholecalciferol (VITAMIN D-3 PO) Take 1 capsule by mouth daily.   clobetasol (TEMOVATE) 0.05 % external solution Apply topically.    clonazePAM (KLONOPIN) 0.5 MG tablet Take 0.5 mg by mouth 2 (two) times daily as needed for anxiety.   diclofenac Sodium (VOLTAREN) 1 % GEL Apply 1 application topically 4 (four) times daily.   docusate sodium (COLACE) 100 MG capsule Take 1 capsule (100 mg total) by mouth 2 (two) times daily.   DULoxetine (CYMBALTA) 60 MG capsule Take 60 mg by mouth 2 (two) times daily.    ferrous sulfate 325 (65 FE) MG EC tablet Take 325 mg by mouth daily.   finasteride (PROSCAR) 5 MG tablet Take 5 mg by mouth daily.   fluticasone (FLONASE) 50 MCG/ACT nasal spray Place 2 sprays into the nose daily as needed for allergies or rhinitis.   folic acid (FOLVITE) 1 MG tablet Take 1 mg by mouth daily.   furosemide (LASIX) 20 MG tablet Take 1 tablet (20 mg total) by mouth as needed (for leg swelling).   Galcanezumab-gnlm (EMGALITY) 120 MG/ML SOAJ Inject 120 mg into the skin every 28 (twenty-eight) days.   hydrALAZINE (APRESOLINE) 25 MG tablet Take 0.5 tablets (12.5 mg total) by mouth 2 (two) times daily as needed.   levothyroxine (SYNTHROID) 125 MCG tablet Take 125 mcg by mouth daily.    lidocaine (LIDODERM) 5 % SMARTSIG:Topical   loratadine (CLARITIN) 10 MG tablet Take 10 mg by mouth daily as needed for allergies.   MAGNESIUM PO Take 1 tablet by mouth daily.   meclizine (ANTIVERT) 25 MG tablet Take 25 mg by mouth 3 (three) times daily as needed for dizziness.   metFORMIN (GLUCOPHAGE) 1000 MG tablet Take 1,000 mg by mouth 2 (two) times daily with a meal.   metoprolol tartrate (LOPRESSOR) 25 MG tablet Take 25 mg by mouth 2 (two) times daily.   mirtazapine (REMERON) 30 MG tablet Take 30 mg by mouth at bedtime.    Multiple Vitamins-Minerals (MULTIVITAMIN WITH MINERALS) tablet Take 1 tablet by mouth daily.   Multiple Vitamins-Minerals (PRESERVISION/LUTEIN) CAPS Take 1 capsule by mouth at bedtime.   mupirocin ointment (BACTROBAN) 2 % Apply 1 application topically 3 (three) times daily as needed (infection).   naloxone  (NARCAN) nasal spray 4 mg/0.1 mL Instill or spray into the nose if you are experiencing shortness of breath or signs of opioid overdose.   omeprazole (PRILOSEC) 20 MG capsule Take 20 mg by mouth 2 (two) times daily before a meal.    oxyCODONE-acetaminophen (PERCOCET) 10-325 MG tablet Take 0.5 tablets by mouth See admin instructions. 1/2 tablet every 3 hours.   polyethylene glycol (MIRALAX / GLYCOLAX) 17 g packet Take 17 g by mouth  daily.   potassium chloride (MICRO-K) 10 MEQ CR capsule Take by mouth 2 (two) times daily.   potassium chloride SA (KLOR-CON M) 20 MEQ tablet Take 20 mEq by mouth daily as needed (with lasix use).   pregabalin (LYRICA) 100 MG capsule Take 100 mg by mouth 3 (three) times daily.   Semaglutide,0.25 or 0.5MG/DOS, (OZEMPIC, 0.25 OR 0.5 MG/DOSE,) 2 MG/3ML SOPN Inject 0.5 mg into the skin once a week.   silodosin (RAPAFLO) 8 MG CAPS capsule Take 8 mg by mouth daily.   solifenacin (VESICARE) 10 MG tablet Take 10 mg by mouth at bedtime.   tiZANidine (ZANAFLEX) 4 MG tablet Take 4 mg by mouth 3 (three) times daily.   Ubrogepant (UBRELVY) 100 MG TABS Take 1 tablet by mouth as needed (May repeat in 2 hours.  Maximum 2 tablets in 24 hours.).     Allergies:   Calcium carbonate and Ms contin [morphine]   Social History   Socioeconomic History   Marital status: Married    Spouse name: susan    Number of children: 1   Years of education: Not on file   Highest education level: Bachelor's degree (e.g., BA, AB, BS)  Occupational History   Not on file  Tobacco Use   Smoking status: Never   Smokeless tobacco: Never  Vaping Use   Vaping Use: Never used  Substance and Sexual Activity   Alcohol use: Yes    Alcohol/week: 1.0 standard drink of alcohol    Types: 1 Glasses of wine per week    Comment: occ.   Drug use: No   Sexual activity: Not on file  Other Topics Concern   Not on file  Social History Narrative   Patient lives at home with spouse [name]   Educ: BS   Occup:  retired Art therapist.  Disabled due to depression and pain.   Tobacco: None   Alc: none   Right handed   Social Determinants of Health   Financial Resource Strain: Not on file  Food Insecurity: Not on file  Transportation Needs: Not on file  Physical Activity: Not on file  Stress: Not on file  Social Connections: Not on file     Family History: The patient's family history includes Anxiety disorder in his daughter; Cancer in his mother; Depression in his daughter; Diabetes in his brother, brother, father, and mother; Heart disease in his brother and father; Hypertension in his brother and brother; Kidney disease in his father; Osteoarthritis in his brother; Rheum arthritis in his brother and mother.  ROS:   Please see the history of present illness.     All other systems reviewed and are negative.  EKGs/Labs/Other Studies Reviewed:    The following studies were reviewed today:   EKG:  EKG is not ordered today.  Recent Labs: 03/31/2022: BNP 78.4 10/25/2022: ALT 14; BUN 20; Creatinine, Ser 1.20; Hemoglobin 10.6; Magnesium 2.3; Platelets 233; Potassium 5.0; Sodium 140; TSH 1.833  Recent Lipid Panel    Component Value Date/Time   CHOL 154 11/23/2021 1437   CHOL 161 07/12/2019 0910   TRIG 129.0 11/23/2021 1437   HDL 56.50 11/23/2021 1437   HDL 62 07/12/2019 0910   CHOLHDL 3 11/23/2021 1437   VLDL 25.8 11/23/2021 1437   LDLCALC 71 11/23/2021 1437   LDLCALC 81 07/12/2019 0910     Risk Assessment/Calculations:                Physical Exam:    VS:  BP Marland Kitchen)  54/40   Pulse 60   Ht 6' 3"$  (1.905 m)   Wt 193 lb (87.5 kg)   BMI 24.12 kg/m     Wt Readings from Last 3 Encounters:  10/25/22 193 lb (87.5 kg)  09/22/22 193 lb (87.5 kg)  09/14/22 200 lb (90.7 kg)     GEN:  Well nourished, well developed in no acute distress HEENT: Normal NECK: No JVD; No carotid bruits LYMPHATICS: No lymphadenopathy CARDIAC: RRR, no murmurs, rubs, gallops RESPIRATORY:  Clear to  auscultation without rales, wheezing or rhonchi  ABDOMEN: Soft, non-tender, non-distended MUSCULOSKELETAL:  No edema; No deformity  SKIN: Warm and dry NEUROLOGIC:  Alert and oriented x 3 PSYCHIATRIC:  Normal affect   ASSESSMENT:    1. Primary hypertension   2. Dizziness   3. H/O aortic valve replacement   4. Complete heart block s/p PPM   5. Ascending aorta dilation (HCC)   6. Moderate mitral regurgitation by prior echocardiogram   7. Hyperlipidemia, unspecified hyperlipidemia type    PLAN:    In order of problems listed above:  Hypertension - blood pressure today markedly hypotensive 54/50 and repeat systolic 48. He did take Metoprolol 35m today. Yesterday BP was 176/106 and took Hydralazine 12.547mPRN around 10AM.   Will transport him to the ED for further workup. If BP remains labile, consider referral to [facility].   Dizziness - Likely related to hypotension. Workup, as above.   History of AVR -continue SBE prophylaxis. Not addressed at this clinic visit due to need for ED eval.   Complete heart block s/p PPM -followed by EP. Not addressed at this clinic visit due to need for ED eval.   Ascending aorta dilation -follow-up CTA due April 2024. Not addressed at this clinic visit due to need for ED eval.   Moderate mitral valve regurgitation -repeat echocardiogram due April 2024. Not addressed at this clinic visit due to need for ED eval.   Hyperlipidemia -LDL 71 on 11/25/2021, managed by his PCP   Disposition - Follow up pending workup in the Emergency Department       Medication Adjustments/Labs and Tests Ordered: Current medicines are reviewed at length with the patient today.  Concerns regarding medicines are outlined above.  No orders of the defined types were placed in this encounter.  No orders of the defined types were placed in this encounter.   Patient Instructions  Medication Instructions:  No changes in clinic today.   Further changes  to be determined based on Emergency Department workup.   *If you need a refill on your cardiac medications before your next appointment, please call your pharmacy*   Lab Work/Testing/Procedures: None ordered today.   Further testing at Emergency Department today.    Follow-Up: [facility] our [facility].  As part of our continuing mission to provide you with exceptional heart care, we have created designated Provider Care Teams.  These Care Teams include your primary Cardiologist (physician) and Advanced Practice Providers (APPs -  Physician Assistants and Nurse Practitioners) who all work together to provide you with the care you need, when you need it.  We recommend signing up for the patient portal called "MyChart".  Sign up information is provided on this After Visit Summary.  MyChart is used to connect with patients for Virtual Visits (Telemedicine).  Patients are able to view lab/test results, encounter notes, upcoming appointments, etc.  Non-urgent messages can be sent to your provider as  well.   To learn more about what you can do with MyChart, go to [email or web address].    Your next appointment:   To be determined after discharge from the Emergency Room    Signed, Trudi Ida, NP  10/25/2022 4:23 PM    [place]

## 2022-10-25 ENCOUNTER — Emergency Department (HOSPITAL_BASED_OUTPATIENT_CLINIC_OR_DEPARTMENT_OTHER)
Admission: EM | Admit: 2022-10-25 | Discharge: 2022-10-25 | Disposition: A | Discharge status: Home / Self Care | Payer: Medicare PPO | Attending: Emergency Medicine | Admitting: Emergency Medicine

## 2022-10-25 ENCOUNTER — Other Ambulatory Visit: Payer: Self-pay

## 2022-10-25 ENCOUNTER — Encounter (HOSPITAL_BASED_OUTPATIENT_CLINIC_OR_DEPARTMENT_OTHER): Payer: Self-pay | Admitting: Emergency Medicine

## 2022-10-25 ENCOUNTER — Emergency Department (HOSPITAL_BASED_OUTPATIENT_CLINIC_OR_DEPARTMENT_OTHER): Payer: Medicare PPO | Admitting: Radiology

## 2022-10-25 ENCOUNTER — Ambulatory Visit (HOSPITAL_BASED_OUTPATIENT_CLINIC_OR_DEPARTMENT_OTHER): Payer: Medicare PPO | Admitting: Cardiology

## 2022-10-25 ENCOUNTER — Emergency Department (HOSPITAL_BASED_OUTPATIENT_CLINIC_OR_DEPARTMENT_OTHER): Payer: Medicare PPO

## 2022-10-25 ENCOUNTER — Encounter (HOSPITAL_BASED_OUTPATIENT_CLINIC_OR_DEPARTMENT_OTHER): Payer: Self-pay | Admitting: Cardiology

## 2022-10-25 VITALS — BP 54/40 | HR 60 | Ht 75.0 in | Wt 193.0 lb

## 2022-10-25 DIAGNOSIS — E039 Hypothyroidism, unspecified: Secondary | ICD-10-CM | POA: Insufficient documentation

## 2022-10-25 DIAGNOSIS — Z7984 Long term (current) use of oral hypoglycemic drugs: Secondary | ICD-10-CM | POA: Insufficient documentation

## 2022-10-25 DIAGNOSIS — E785 Hyperlipidemia, unspecified: Secondary | ICD-10-CM

## 2022-10-25 DIAGNOSIS — I959 Hypotension, unspecified: Secondary | ICD-10-CM | POA: Diagnosis not present

## 2022-10-25 DIAGNOSIS — Z7982 Long term (current) use of aspirin: Secondary | ICD-10-CM | POA: Diagnosis not present

## 2022-10-25 DIAGNOSIS — R531 Weakness: Secondary | ICD-10-CM | POA: Diagnosis not present

## 2022-10-25 DIAGNOSIS — I10 Essential (primary) hypertension: Secondary | ICD-10-CM | POA: Diagnosis not present

## 2022-10-25 DIAGNOSIS — Z950 Presence of cardiac pacemaker: Secondary | ICD-10-CM | POA: Insufficient documentation

## 2022-10-25 DIAGNOSIS — Z952 Presence of prosthetic heart valve: Secondary | ICD-10-CM | POA: Diagnosis not present

## 2022-10-25 DIAGNOSIS — I442 Atrioventricular block, complete: Secondary | ICD-10-CM

## 2022-10-25 DIAGNOSIS — R42 Dizziness and giddiness: Secondary | ICD-10-CM

## 2022-10-25 DIAGNOSIS — Z79899 Other long term (current) drug therapy: Secondary | ICD-10-CM | POA: Insufficient documentation

## 2022-10-25 DIAGNOSIS — E119 Type 2 diabetes mellitus without complications: Secondary | ICD-10-CM | POA: Diagnosis not present

## 2022-10-25 DIAGNOSIS — I340 Nonrheumatic mitral (valve) insufficiency: Secondary | ICD-10-CM

## 2022-10-25 DIAGNOSIS — I77810 Thoracic aortic ectasia: Secondary | ICD-10-CM

## 2022-10-25 LAB — URINALYSIS, ROUTINE W REFLEX MICROSCOPIC
Bacteria, UA: NONE SEEN
Bilirubin Urine: NEGATIVE
Glucose, UA: NEGATIVE mg/dL
Ketones, ur: NEGATIVE mg/dL
Leukocytes,Ua: NEGATIVE
Nitrite: NEGATIVE
Protein, ur: NEGATIVE mg/dL
Specific Gravity, Urine: 1.011 (ref 1.005–1.030)
pH: 5.5 (ref 5.0–8.0)

## 2022-10-25 LAB — COMPREHENSIVE METABOLIC PANEL
ALT: 14 U/L (ref 0–44)
AST: 15 U/L (ref 15–41)
Albumin: 4 g/dL (ref 3.5–5.0)
Alkaline Phosphatase: 59 U/L (ref 38–126)
Anion gap: 10 (ref 5–15)
BUN: 20 mg/dL (ref 8–23)
CO2: 24 mmol/L (ref 22–32)
Calcium: 9.4 mg/dL (ref 8.9–10.3)
Chloride: 106 mmol/L (ref 98–111)
Creatinine, Ser: 1.2 mg/dL (ref 0.61–1.24)
GFR, Estimated: >60 mL/min (ref >60)
Glucose, Bld: 88 mg/dL (ref 70–99)
Potassium: 5 mmol/L (ref 3.5–5.1)
Sodium: 140 mmol/L (ref 135–145)
Total Bilirubin: 0.3 mg/dL (ref 0.3–1.2)
Total Protein: 6.2 g/dL — ABNORMAL LOW (ref 6.5–8.1)

## 2022-10-25 LAB — CBC WITH DIFFERENTIAL/PLATELET
Abs Immature Granulocytes: 0.01 10*3/uL (ref 0.00–0.07)
Basophils Absolute: 0.1 10*3/uL (ref 0.0–0.1)
Basophils Relative: 2 %
Eosinophils Absolute: 0.4 10*3/uL (ref 0.0–0.5)
Eosinophils Relative: 7 %
HCT: 33.6 % — ABNORMAL LOW (ref 39.0–52.0)
Hemoglobin: 10.6 g/dL — ABNORMAL LOW (ref 13.0–17.0)
Immature Granulocytes: 0 %
Lymphocytes Relative: 30 %
Lymphs Abs: 1.7 10*3/uL (ref 0.7–4.0)
MCH: 25.2 pg — ABNORMAL LOW (ref 26.0–34.0)
MCHC: 31.5 g/dL (ref 30.0–36.0)
MCV: 80 fL (ref 80.0–100.0)
Monocytes Absolute: 0.5 10*3/uL (ref 0.1–1.0)
Monocytes Relative: 9 %
Neutro Abs: 2.9 10*3/uL (ref 1.7–7.7)
Neutrophils Relative %: 52 %
Platelets: 233 10*3/uL (ref 150–400)
RBC: 4.2 MIL/uL — ABNORMAL LOW (ref 4.22–5.81)
RDW: 17.4 % — ABNORMAL HIGH (ref 11.5–15.5)
WBC: 5.6 10*3/uL (ref 4.0–10.5)
nRBC: 0 % (ref 0.0–0.2)

## 2022-10-25 LAB — TSH: TSH: 1.833 u[IU]/mL (ref 0.350–4.500)

## 2022-10-25 LAB — TROPONIN I (HIGH SENSITIVITY)
Troponin I (High Sensitivity): 3 ng/L (ref <18)
Troponin I (High Sensitivity): 2 ng/L (ref <18)

## 2022-10-25 LAB — MAGNESIUM: Magnesium: 2.3 mg/dL (ref 1.7–2.4)

## 2022-10-25 LAB — CBG MONITORING, ED: Glucose-Capillary: 67 mg/dL — ABNORMAL LOW (ref 70–99)

## 2022-10-25 LAB — T4, FREE: Free T4: 0.96 ng/dL (ref 0.61–1.12)

## 2022-10-25 MED ORDER — SODIUM CHLORIDE 0.9 % IV BOLUS (SEPSIS)
1000.0000 mL | Freq: Once | INTRAVENOUS | Status: AC
  Administered 2022-10-25: 1000 mL via INTRAVENOUS

## 2022-10-25 NOTE — Patient Instructions (Signed)
Medication Instructions:  No changes in clinic today.   Further changes to be determined based on Emergency Department workup.   *If you need a refill on your cardiac medications before your next appointment, please call your pharmacy*   Lab Work/Testing/Procedures: None ordered today.   Further testing at Emergency Department today.    Follow-Up: At [facility], you and your health needs are our priority.  As part of our continuing mission to provide you with exceptional heart care, we have created designated Provider Care Teams.  These Care Teams include your primary Cardiologist (physician) and Advanced Practice Providers (APPs -  Physician Assistants and Nurse Practitioners) who all work together to provide you with the care you need, when you need it.  We recommend signing up for the patient portal called "MyChart".  Sign up information is provided on this After Visit Summary.  MyChart is used to connect with patients for Virtual Visits (Telemedicine).  Patients are able to view lab/test results, encounter notes, upcoming appointments, etc.  Non-urgent messages can be sent to your provider as well.   To learn more about what you can do with MyChart, go to [email or web address].    Your next appointment:   To be determined after discharge from the Emergency Room

## 2022-10-25 NOTE — ED Notes (Signed)
Extensive troubleshooting has taken place to interrogate PT's pacemaker. Attempted with [facility] interrogation equipment with no success. I have requested the secretary contact [facility] to reach out to the care team to further resolve the issue.

## 2022-10-25 NOTE — Discharge Instructions (Addendum)
You were seen for an episode of low blood pressures.  It is unclear what is causing your low blood pressure however your workup including blood work, CT scan, chest x-ray and urine were reassuring.  There is no obvious source to what was causing low blood pressures.  There is no clear requirement for admission to the hospital at this time.  Follow-up with your cardiologist and neurologist regarding your visit to the ER today.  Come back if any fainting episodes, severe chest pain, shortness of breath, inability to walk, or any other symptoms concerning to you.   For reaching out to your pacemaker company, please reach out to [employer]. Pacemaker Device Info: St Jude Medical Implantable Lead Model: [id number] Tendril MRI     Syncope Clinic Info at [facility]:  [facility] at [facility] at [facility] [address] [place], [place] [address] Get Driving Directions  Main Phone:[phone]

## 2022-10-25 NOTE — ED Notes (Signed)
Pt aware of the need for a urine sample... Unable to currently provide.Marland KitchenMarland Kitchen

## 2022-10-25 NOTE — ED Notes (Signed)
Still working on trouble shooting Pt's pacemaker interp.Antonio KitchenMarland Oliver

## 2022-10-25 NOTE — ED Provider Notes (Signed)
[facility] Provider Note   CSN: [id number] Arrival date & time: 10/25/22  1018     History  Chief Complaint  Patient presents with   Hypotension    Antonio Oliver is a 67 y.o. male. With pmh HTN, bicuspid aortic valve (s/p AVR 2018), complete heart block following his AVR in 2018 (PPM), thoracic aortic aneurysm, ascending aorta dilatation,  DM2, HLD, hypothyroidism, BPH, anxiety, depression, dyslipidemia,  chronic pain presenting from cardiology office with generalized weakness and hypotension.  On arrival to ED, BP 86/54.  Patient has longstanding history of labile blood pressures of unclear etiology.  They treat numbers as AC whenever high he will get a as needed hydralazine.  He has been getting more as needed hydralazine this past week than usual.  The only blood pressure medication he took today was metoprolol this morning.  He last took hydralazine yesterday morning.  He had only had a small breakfast manage this morning has not drank much.  When he got out of the car he was to generalized fatigue and weak so his wife put him in a wheelchair to take him into the appointment.  He has had no local weakness.  He has had no recent fevers or illness.  No vomiting, no diarrhea, no melena or hematochezia.  Denies any chest pain or abdominal pain.  Denies any issues with pacemaker.  Has had no syncopal episodes just felt lightheaded upon standing out of the car and like he was going to faint hence why he was placed in the wheelchair.  Usually they will wait it out and his blood pressures will increase and improve.  HPI     Home Medications Prior to Admission medications   Medication Sig Start Date End Date Taking? Authorizing Provider  aspirin EC 81 MG tablet Take 81 mg by mouth daily.    [provider]  atorvastatin (LIPITOR) 20 MG tablet Take 20 mg by mouth daily at 6 PM.     [provider]  brexpiprazole (REXULTI) 2 MG TABS  tablet Take 2 mg by mouth daily.    [provider]  CALCIUM CITRATE PO Take 500 mg by mouth 2 (two) times daily.    [provider]  Cholecalciferol (VITAMIN D-3 PO) Take 1 capsule by mouth daily.    [provider]  clobetasol (TEMOVATE) 0.05 % external solution Apply topically. 08/06/22   [provider]  clonazePAM (KLONOPIN) 0.5 MG tablet Take 0.5 mg by mouth 2 (two) times daily as needed for anxiety.    [provider]  diclofenac Sodium (VOLTAREN) 1 % GEL Apply 1 application topically 4 (four) times daily. 12/25/20   [provider]  docusate sodium (COLACE) 100 MG capsule Take 1 capsule (100 mg total) by mouth 2 (two) times daily. 01/13/21   Ezekiel Slocumb, DO  DULoxetine (CYMBALTA) 60 MG capsule Take 60 mg by mouth 2 (two) times daily.     [provider]  ferrous sulfate 325 (65 FE) MG EC tablet Take 325 mg by mouth daily.    [provider]  finasteride (PROSCAR) 5 MG tablet Take 5 mg by mouth daily.    [provider]  fluticasone (FLONASE) 50 MCG/ACT nasal spray Place 2 sprays into the nose daily as needed for allergies or rhinitis.    [provider]  folic acid (FOLVITE) 1 MG tablet Take 1 mg by mouth daily.    [provider]  furosemide (  LASIX) 20 MG tablet Take 1 tablet (20 mg total) by mouth as needed (for leg swelling). 07/12/19   Revankar, Reita Cliche, MD  Galcanezumab-gnlm (EMGALITY) 120 MG/ML SOAJ Inject 120 mg into the skin every 28 (twenty-eight) days. 04/22/22   Pieter Partridge, DO  hydrALAZINE (APRESOLINE) 25 MG tablet Take 0.5 tablets (12.5 mg total) by mouth 2 (two) times daily as needed. 03/31/22   Darreld Mclean, PA-C  levothyroxine (SYNTHROID) 125 MCG tablet Take 125 mcg by mouth daily.     [provider]  lidocaine (LIDODERM) 5 % SMARTSIG:Topical 06/07/22   [provider]  loratadine (CLARITIN) 10 MG tablet Take 10 mg by mouth daily as needed for  allergies.    [provider]  MAGNESIUM PO Take 1 tablet by mouth daily.    [provider]  meclizine (ANTIVERT) 25 MG tablet Take 25 mg by mouth 3 (three) times daily as needed for dizziness. 04/02/22   [provider]  metFORMIN (GLUCOPHAGE) 1000 MG tablet Take 1,000 mg by mouth 2 (two) times daily with a meal.    [provider]  metoprolol tartrate (LOPRESSOR) 25 MG tablet Take 25 mg by mouth 2 (two) times daily. 09/20/22   [provider]  mirtazapine (REMERON) 30 MG tablet Take 30 mg by mouth at bedtime.     [provider]  Multiple Vitamins-Minerals (MULTIVITAMIN WITH MINERALS) tablet Take 1 tablet by mouth daily.    [provider]  Multiple Vitamins-Minerals (PRESERVISION/LUTEIN) CAPS Take 1 capsule by mouth at bedtime.    [provider]  mupirocin ointment (BACTROBAN) 2 % Apply 1 application topically 3 (three) times daily as needed (infection). 11/14/20   [provider]  naloxone [facility]) nasal spray 4 mg/0.1 mL Instill or spray into the nose if you are experiencing shortness of breath or signs of opioid overdose. 01/21/21   Jessy Oto, MD  omeprazole (PRILOSEC) 20 MG capsule Take 20 mg by mouth 2 (two) times daily before a meal.     [provider]  oxyCODONE-acetaminophen (PERCOCET) 10-325 MG tablet Take 0.5 tablets by mouth See admin instructions. 1/2 tablet every 3 hours.    [provider]  polyethylene glycol (MIRALAX / GLYCOLAX) 17 g packet Take 17 g by mouth daily. 01/14/21   Ezekiel Slocumb, DO  potassium chloride (MICRO-K) 10 MEQ CR capsule Take by mouth 2 (two) times daily. 04/29/22   [provider]  potassium chloride SA (KLOR-CON M) 20 MEQ tablet Take 20 mEq by mouth daily as needed (with lasix use).    [provider]  pregabalin (LYRICA) 100 MG capsule Take 100 mg by mouth 3 (three) times daily. 04/05/22   [provider]  Semaglutide,0.25 or  0.5MG/DOS, (OZEMPIC, 0.25 OR 0.5 MG/DOSE,) 2 MG/3ML SOPN Inject 0.5 mg into the skin once a week. 03/11/22     silodosin (RAPAFLO) 8 MG CAPS capsule Take 8 mg by mouth daily. 09/22/21   [provider]  solifenacin (VESICARE) 10 MG tablet Take 10 mg by mouth at bedtime.    [provider]  tiZANidine (ZANAFLEX) 4 MG tablet Take 4 mg by mouth 3 (three) times daily. 04/05/22   [provider]  Ubrogepant (UBRELVY) 100 MG TABS Take 1 tablet by mouth as needed (May repeat in 2 hours.  Maximum 2 tablets in 24 hours.). 04/22/22   Pieter Partridge, DO      Allergies    Calcium carbonate and Ms contin [morphine]  Review of Systems   Review of Systems  Physical Exam Updated Vital Signs BP (!) 143/83   Pulse (!) 59   Temp 98.2 F (36.8 C) (Oral)   Resp 15   SpO2 100%  Physical Exam Constitutional: Alert and oriented.  Chronically ill appearing and slightly fatigued but no acute distress Eyes: Conjunctivae are normal. ENT      Head: Normocephalic and atraumatic. Cardiovascular: S1, S2, regular rate, normal and symmetric distal pulses are present in all extremities.Warm and well perfused. Respiratory: Normal respiratory effort. Breath sounds are normal.  O2 sat 100 on RA Gastrointestinal: Soft and nontender.  Musculoskeletal:       Right lower leg: No tenderness or edema.      Left lower leg: No tenderness or edema. Neurologic: Normal speech and language.  No drift of bilateral upper extremities.  No drift of bilateral lower extremities.  Globally weak 4+ out of 5 strength.  Sensation grossly intact.  No facial droop.  No gross focal neurologic deficits are appreciated. Skin: Skin is warm, dry  Psychiatric: Mood and affect are normal. Speech and behavior are normal.  ED Results / Procedures / Treatments   Labs (all labs ordered are listed, but only abnormal results are displayed) Labs Reviewed  CBC WITH DIFFERENTIAL/PLATELET - Abnormal; Notable for the following  components:      Result Value   RBC 4.20 (*)    Hemoglobin 10.6 (*)    HCT 33.6 (*)    MCH 25.2 (*)    RDW 17.4 (*)    All other components within normal limits  COMPREHENSIVE METABOLIC PANEL - Abnormal; Notable for the following components:   Total Protein 6.2 (*)    All other components within normal limits  URINALYSIS, ROUTINE W REFLEX MICROSCOPIC - Abnormal; Notable for the following components:   Hgb urine dipstick TRACE (*)    All other components within normal limits  CBG MONITORING, ED - Abnormal; Notable for the following components:   Glucose-Capillary 67 (*)    All other components within normal limits  TSH  MAGNESIUM  T4, FREE  TROPONIN I (HIGH SENSITIVITY)  TROPONIN I (HIGH SENSITIVITY)    EKG None  Radiology CT Head Wo Contrast  Result Date: 10/25/2022 CLINICAL DATA:  Weakness EXAM: CT HEAD WITHOUT CONTRAST TECHNIQUE: Contiguous axial images were obtained from the base of the skull through the vertex without intravenous contrast. RADIATION DOSE REDUCTION: This exam was performed according to the departmental dose-optimization program which includes automated exposure control, adjustment of the mA and/or kV according to patient size and/or use of iterative reconstruction technique. COMPARISON:  CT Head 04/12/22 FINDINGS: Brain: No evidence of acute infarction, hemorrhage, hydrocephalus, extra-axial collection or mass lesion/mass effect. Unchanged arachnoid cyst at the vertex. Sequela of moderate chronic microvascular ischemic change. Vascular: No hyperdense vessel or unexpected calcification. Skull: Normal. Negative for fracture or focal lesion. Sinuses/Orbits: No middle ear or mastoid effusion. Mucous retention cyst in the left maxillary sinus. Orbits are unremarkable. Other: None. IMPRESSION: No acute intracranial process. Electronically Signed   By: Marin Roberts M.D.   On: 10/25/2022 11:30   DG Chest 1 View  Result Date: 10/25/2022 CLINICAL DATA:  Syncope EXAM: CHEST   1 VIEW COMPARISON:  10/19/2021 x-ray.  CT angiogram 12/07/2021 FINDINGS: Kyphotic x-ray obscuring the right lung apex with rotation. Postop chest with sternal wires and prosthetic aortic valve. Left upper chest pacemaker with leads overlying the right side of the heart. No consolidation, pneumothorax or effusion. No edema.  IMPRESSION: Postop chest with prosthetic aortic valve. Left upper chest pacemaker. No acute findings. Electronically Signed   By: Jill Side M.D.   On: 10/25/2022 11:00    Procedures Procedures  Remain on constant cardiac monitoring, sinus rhythm with normal rates.  Medications Ordered in ED Medications  sodium chloride 0.9 % bolus 1,000 mL (0 mLs Intravenous Stopped 10/25/22 1153)    ED Course/ Medical Decision Making/ A&P Clinical Course as of 10/25/22 1828  Mon Oct 25, 2022  1423 Patient's blood pressures have improved and has had no further hypotension during 4 hours in the ED since presenting.  I did discuss workup with family and they feel reassured to follow-up outpatient with specialist.  They have cardiology and neurology following.  Recommended syncope clinic at [facility].  We are unable to interrogate patient's pacemaker today however most recent remote pacemaker check on the 13th was reassuring in care everywhere.  They are going to send interrogation remotely to their pacemaker provider.  Did discuss that it was not absolutely necessary since no syncopal event today no chest pain or shortness of breath or palpitations.  Troponins have been reassuring.  Patient's family in agreement with discharge at this time feels safe to go home. [VB]    Clinical Course User Index [VB] Elgie Congo, MD                            Medical Decision Making AADITYA SCHACHT is a 67 y.o. male. With pmh HTN, bicuspid aortic valve (s/p AVR 2018), complete heart block following his AVR in 2018 (PPM), thoracic aortic aneurysm, ascending aorta dilatation,  DM2, HLD, hypothyroidism, BPH,  anxiety, depression, dyslipidemia,  chronic pain presenting from cardiology office with generalized weakness and hypotension.  On arrival to ED, BP 86/54.   Patient's presentation could have multiple underlying etiologies including but not limited to, dysautonomia, medication side effects and polypharmacy, dehydration, anemia.  Doubt sepsis or cardiogenic shock picture with no prolonged period of hypotension during ED stay, no infectious symptoms, no chest pain.  EKG unchanged from prior, high sensitive troponin 2 and unremarkable, doubt cardiogenic etiology.  No cardiopulmonary symptoms.  No pitting edema or signs of fluid overload or heart failure.  Hemoglobin 10.6 at baseline.  Creatinine 1.2 within normal limits.  UA with trace hemoglobin, no signs of UTI.  Chest x-ray reviewed by me no pneumonia.  CT head unremarkable.  TSH within normal limits.  Patient given 1L IVF with improvement.  Suspect patient's hypotension likely related to medication, possible dehydration or dysautonomia.  Patient's blood pressures have improved and has had no further hypotension during 4 hours in the ED since presenting.  I did discuss workup with family and they feel reassured to follow-up outpatient with specialist.  They have cardiology and neurology following.  Recommended syncope clinic at [facility].  We are unable to interrogate patient's pacemaker today however most recent remote pacemaker check on the 13th was reassuring in care everywhere.  They are going to send interrogation remotely to their pacemaker provider.  Did discuss that it was not absolutely necessary since no syncopal event today no chest pain or shortness of breath or palpitations.  Troponins have been reassuring.  Patient's family in agreement with discharge at this time feels safe to go home.     Amount and/or Complexity of Data Reviewed Labs: ordered. Radiology: ordered.    Final Clinical Impression(s) / ED Diagnoses Final diagnoses:   Hypotension,  unspecified hypotension type    Rx / DC Orders ED Discharge Orders     None         Elgie Congo, MD 10/25/22 [phone]

## 2022-10-25 NOTE — ED Notes (Addendum)
Pacemaker interrogation being attempted... Calling Interrogation rep for assistance.Marland KitchenMarland Kitchen

## 2022-10-25 NOTE — ED Triage Notes (Signed)
Pt arrives to ED with c/o hypotension. Pt reports labile at home blood pressures either very high or low. Pt at cardiology appointment this morning with blood pressures [id number] systolic. Pt notes feeling weak, fatigued, and lightheaded since this morning. Family reports multiple falls over the past year.

## 2022-10-26 ENCOUNTER — Ambulatory Visit: Payer: Medicare PPO

## 2022-10-26 DIAGNOSIS — R293 Abnormal posture: Secondary | ICD-10-CM | POA: Diagnosis not present

## 2022-10-26 DIAGNOSIS — M542 Cervicalgia: Secondary | ICD-10-CM

## 2022-10-26 DIAGNOSIS — R42 Dizziness and giddiness: Secondary | ICD-10-CM

## 2022-10-26 DIAGNOSIS — R2681 Unsteadiness on feet: Secondary | ICD-10-CM

## 2022-10-26 DIAGNOSIS — M6281 Muscle weakness (generalized): Secondary | ICD-10-CM

## 2022-10-26 NOTE — Therapy (Signed)
OUTPATIENT PHYSICAL THERAPY NEURO TREATMENT   Patient Name: Antonio Oliver MRN: [id number] DOB:09/29/1955, 67 y.o., male Today's Date: 10/26/2022   PCP: Cher Nakai, MD  REFERRING PROVIDER: Meredith Staggers, MD  END OF SESSION:  PT End of Session - 10/26/22 0934     Visit Number 6    Number of Visits 13    Date for PT Re-Evaluation 11/17/22    Authorization Type [employer]    Authorization Time Period approved 13 PT visits from 10/06/2022-11/17/2022    Authorization - Visit Number 6    Authorization - Number of Visits 13    PT Start Time 0930    PT Stop Time 1015    PT Time Calculation (min) 45 min    Activity Tolerance Patient tolerated treatment well    Behavior During Therapy [facility] for tasks assessed/performed                 Past Medical History:  Diagnosis Date   Anxiety and depression    Ascending aorta dilation ([place]) 03/06/2019   4.3 cm 2022   Benign prostatic hyperplasia without lower urinary tract symptoms 11/17/2016   Last Assessment & Plan:  Stable PSA today.  Formatting of this note might be different from the original. Last Assessment & Plan:  Stable PSA today. Last Assessment & Plan:  Formatting of this note might be different from the original. Stable PSA today.   BPH (benign prostatic hyperplasia)    Bradycardia 02/01/2019   Cervical myelopathy ([place]) 11/14/2018   Charcot's joint of foot 03/05/2019   Chronic back pain 04/03/2013   Chronic inflammatory arthritis 11/17/2016   History of positive rheumatoid factor in the past. Denies placement on immunosuppressive therapy   Chronic pain syndrome    Failed back surgical syndrome.  Chronic neck pain.  Chronic low back pain with sciatica bilateral   Colon cancer screening    2023 cologuard neg   Complete heart block (HCC)    Following AVR s/p [employer] Jude PPM in 03/2017   DM2 (diabetes mellitus, type 2) ([place]) 04/03/2013   Last Assessment & Plan:  Formatting of this note might be different from the  original. Diabetes is unchanged.  Continue current treatment regimen. Regular aerobic exercise. Diabetes will be reassessed in 3 months. Will check A1c today. Denies any problems with feet or sensation.   Elevated PSA 11/2021   Enthesopathy of ankle and tarsus 04/04/2013   Last Assessment & Plan:  Continues with swelling after prolonged standing.  Still  Wearing support hose, which helps.   Essential hypertension 10/30/2018   FHx: migraine headaches 04/04/2013   Fracture of capitate bone of wrist 02/26/2019   Fracture of tibia 03/05/2019   GERD (gastroesophageal reflux disease) 10/30/2018   Glaucoma 04/03/2013   Hemangioma of skin and subcutaneous tissue 07/07/2017   History of aortic valve replacement 2018   bicuspid AV   History of kidney stones 11/17/2016   History of sleep apnea    wt loss->gone   Hypothyroidism 10/30/2018   IDA (iron deficiency anemia) 11/2021   malabs d/t gastric bipass   Lesion of plantar nerve 04/04/2013   Lumbar stenosis with neurogenic claudication 01/08/2021   Major depressive disorder, recurrent ([place])    ECT in the remote past.  Patient disabled due to recurrent depression and chronic pain.   Migraine syndrome    Mixed dyslipidemia 10/30/2018   Morbid obesity ([place]) 10/27/2011   Nondisplaced fracture of medial cuneiform of left foot, initial encounter for closed  fracture 11/16/2018   Osteoarthritis 10/30/2018   Personal history of tobacco use, presenting hazards to health 04/04/2013   Presence of permanent cardiac pacemaker    Severe single current episode of major depressive disorder, without psychotic features ([place]) 11/17/2016   Status post bariatric surgery 04/05/2011   bipass surg --presurg wt 310 lbs   Syncope 02/14/2019   Vitamin D deficiency 10/30/2018   Past Surgical History:  Procedure Laterality Date   ANTERIOR CERVICAL DECOMP/DISCECTOMY FUSION N/A 11/17/2018   Procedure: CERVICAL THREE-CERVICAL FOUR, CERVICAL FOUR-CERVICAL FIVE, CERVICAL  FIVE-CERVICAL SIX ANTERIOR CERVICAL DECOMPRESSION/DISCECTOMY FUSION;  Surgeon: Earnie Larsson, MD;  Location: [place];  Service: Neurosurgery;  Laterality: N/A;   AORTIC VALVE REPLACEMENT  [id number]   APPLICATION OF WOUND VAC Left 04/05/2019   Procedure: Application Of Wound Vac;  Surgeon: Erle Crocker, MD;  Location: [place];  Service: Orthopedics;  Laterality: Left;   BACK SURGERY     x4   CARDIOVASCULAR STRESS TEST  05/05/2021   lexiscan neg   FASCIOTOMY Left 04/05/2019   Left leg 2 compartment fasciotomy   FASCIOTOMY Left 04/05/2019   Procedure: FASCIOTOMY LEFT LOWER LEG;  Surgeon: Erle Crocker, MD;  Location: [place];  Service: Orthopedics;  Laterality: Left;   FOOT NEUROMA SURGERY     GASTRIC BYPASS  2012   HEMATOMA EVACUATION Left 04/05/2019   Procedure: Evacuation Hematoma Left Lower Leg;  Surgeon: Erle Crocker, MD;  Location: [place];  Service: Orthopedics;  Laterality: Left;   HEMORRHOID SURGERY     over 30 years ago   [place] umb   I & D EXTREMITY Left 04/13/2022   Procedure: IRRIGATION AND DEBRIDEMENT OF ELBOW AND BURSECTOMY;  Surgeon: Iran Planas, MD;  Location: [place];  Service: Orthopedics;  Laterality: Left;   LUMBAR LAMINECTOMY/DECOMPRESSION MICRODISCECTOMY N/A 01/08/2021   Procedure: CENTRAL LUMBAR LAMINECTOMY LUMBAR TWO-THREE, LUMBAR THREE-FOUR;  Surgeon: Jessy Oto, MD;  Location: [place];  Service: Orthopedics;  Laterality: N/A;   NASAL SINUS SURGERY     x2   NECK SURGERY     OLECRANON BURSECTOMY  2023   d/t septic bursitis   TRANSTHORACIC ECHOCARDIOGRAM  12/2020   EF normal, mod MR, ascending AA (4.4 cm)   Patient Active Problem List   Diagnosis Date Noted   Cervical dystonia 08/25/2022   Postlaminectomy syndrome, cervical region 08/25/2022   Septic bursitis 04/16/2022   Right bundle branch block 04/15/2022   Orthostasis 04/15/2022   History of aortic stenosis 04/13/2022   Septic bursitis of elbow 04/12/2022   Diabetes mellitus  type 2 in nonobese ([place]) 04/16/2021   Lumbar stenosis with neurogenic claudication 01/08/2021   Back pain 01/05/2021   BPH (benign prostatic hyperplasia)    Anxiety and depression    Hypertension associated with diabetes ([place])    Presence of permanent cardiac pacemaker 12/28/2019   Ascending aorta dilation ([place]) 03/06/2019   Cervical myelopathy ([place]) 11/14/2018   Hyperlipidemia associated with type 2 diabetes mellitus ([place]) 10/30/2018   Vitamin D deficiency 10/30/2018   Osteoarthritis 10/30/2018   GERD (gastroesophageal reflux disease) 10/30/2018   Hypothyroidism 10/30/2018   Essential hypertension 10/30/2018   Mixed dyslipidemia 10/30/2018   Hemangioma of skin and subcutaneous tissue 07/07/2017   Lentigo 07/07/2017   Multiple benign melanocytic nevi 07/07/2017   Complete heart block ([place]) 05/04/2017   S/P AVR (aortic valve replacement) 03/10/2017   Bicuspid aortic valve 12/22/2016   Benign prostatic hyperplasia without lower urinary tract symptoms 11/17/2016  Chronic inflammatory arthritis 11/17/2016   Chronic bilateral low back pain 11/17/2016   Heart murmur 11/17/2016   History of kidney stones 11/17/2016   Anxiety 06/20/2013   Personal history of tobacco use, presenting hazards to health 04/04/2013   Neck pain 04/04/2013   Status post bariatric surgery 04/04/2013   FHx: migraine headaches 04/04/2013   DM2 (diabetes mellitus, type 2) ([place]) 04/03/2013   Chronic pain 04/03/2013   Glaucoma 04/03/2013   Morbid obesity ([place]) 10/27/2011    ONSET DATE: several years   REFERRING DIAG: G24.3 (ICD-10-CM) - Cervical dystonia  THERAPY DIAG:  Abnormal posture  Cervicalgia  Muscle weakness (generalized)  Dizziness and giddiness  Unsteadiness on feet  Rationale for Evaluation and Treatment: Rehabilitation  SUBJECTIVE:                                                                                                                                                                                              SUBJECTIVE STATEMENT:  Ended up in ER yesterday due to very low BP. Put on fluids with appropriate rise. BP this morning is high again  Pt accompanied by: wife  PERTINENT HISTORY: Pacemaker, anxiety and depression, bradycardia, cervical myelopathy, charcot's foot, DMII, HTN, migraines, HLD, C3-6, ACDF 2020, L LE fasciotomy 2020, L2-4 laminectomy 2022; pt reports "bad wrist" on R side from cyst that has been removed and has come back  PAIN:  Are you having pain? Yes: NPRS scale: 7/10 Pain location: R shoulders and down the arm and into shoulder blade, R side of neck as well, down back, having low back pain as well  Pain description: neck and R shoulder throbbing, lower back constant pain/discomfort  Aggravating factors: movement Relieving factors: pain meds, lidoderm patches  PRECAUTIONS: ICD/Pacemaker  WEIGHT BEARING RESTRICTIONS: No  FALLS: Has patient fallen in last 6 months? Yes. Number of falls wife reports multiple falls  LIVING ENVIRONMENT: Lives with: lives with their spouse Lives in: House/apartment; condo Stairs:  1 step to enter Has following equipment at home: Single point cane, Environmental consultant - 2 wheeled, Wheelchair (manual), Electronics engineer, and Grab bars  PLOF: Independent with basic ADLs  PATIENT GOALS: improve neck pain  OBJECTIVE:   TODAY'S TREATMENT: 10/26/22 Activity Comments  Vitals at start 73/48 mmHg, 68 bpm--spouse reports that his BP was elevated this AM and gave hydralazine  Positioned in supine for manual therapy -joint mobilization bilateral cervical column for left lateral flexion and rotation -contract-relax to improve left lateral flexion and rotation and attention to right sternocleidomastoid -STM to left cervical column and right SCM  Cervical strengthening -supine AAROM for cervical retraction 3x10 -supine  neck extension isometric 3x10 w/ handhold guidance for alignment  Vitals after supine x 10 min 94/62 mmHg, 66 bpm   Vitals at sitting EOB 68/49 mmHg, 67 bpm (x 1 min) 87/60 mmHg, 69 bpm (x 3 min)  Assisted/demonstration of cervical SNAG For lateral flexion and extension  Vitals in standing 74/48 mmHg, 65 bpm  Education in "sensory tricks" for CD         Trigger Point Dry-Needling  Treatment instructions: Expect mild to moderate muscle soreness. S/S of pneumothorax if dry needled over a lung field, and to seek immediate medical attention should they occur. Patient verbalized understanding of these instructions and education.  Patient Consent Given: Yes Education handout provided: Previously provided Muscles treated: R UT and LS; using standard technique  Electrical stimulation performed: No Parameters: N/A Treatment response/outcome: pt reported improvement       HOME EXERCISE PROGRAM Last updated: 10/15/22 Access Code: [email or web address] URL: [email or web address] Date: 10/15/2022 Prepared by: [facility]  Exercises - Seated Cervical Rotation AROM  - 1 x daily - 5 x weekly - 2 sets - 10 reps - Seated Cervical Extension AROM  - 1 x daily - 5 x weekly - 2 sets - 10 reps - Seated Cervical Sidebending AROM  - 1 x daily - 5 x weekly - 2 sets - 10 reps - Seated Scapular Retraction  - 1 x daily - 5 x weekly - 2 sets - 10 reps - 3 sec hold - Putty Squeezes  - 2 x daily - 7 x weekly - Seated Shoulder Row with Anchored Resistance  - 1 x daily - 5 x weekly - 2 sets - 10 reps - Seated Shoulder Extension and Scapular Retraction with Resistance  - 1 x daily - 5 x weekly - 2 sets - 10 reps - Narrow Stance with Counter Support  - 1 x daily - 5 x weekly - 2-3 sets - 30 sec hold - Standing Toe Taps  - 1 x daily - 5 x weekly - 2 sets - 10 reps - Cervical Extension AROM with Strap  - 1 x daily - 7 x weekly - 3 sets - 10 reps   PATIENT EDUCATION: Education details: edu on Veterinary surgeon Person educated: Patient and Spouse Education method: Explanation,  Demonstration, Tactile cues, Verbal cues, and Handouts Education comprehension: verbalized understanding and returned demonstration     Below measures were taken at time of initial evaluation unless otherwise specified:   DIAGNOSTIC FINDINGS: 06/22/22 cervical MRI: Stable postoperative changes from C3-C6 with anterior and interbody fusion hardware.  Stable degenerative subluxation at C6-7 with a bulging uncovered Disc. Small foci of increased T2 signal intensity in the spinal cord at C3-4 suggesting chronic ischemic changes/myelomalacia  COGNITION: Overall cognitive status: Within functional limits for tasks assessed   SENSATION: Diminished sensation in R arm and 5th digit, R thigh and foot  COORDINATION: Alternating pronation/supination: slow B Alternating toe tap: WNL B Finger to nose: WNL B   POSTURE: resting in R cervical sidebending and neck flexion; rounded shoulders and slight thoracic kyphosis  Resting in 17 deg flexion, 15 deg R sidebending   CERVICAL ROM:   Active ROM A/PROM (deg) eval  Flexion 45 *pain  Extension 4 *pain  Right lateral flexion 28 *pain  Left lateral flexion -10 *pain  Right rotation 37 * pain  Left rotation 40 *pain   (Blank rows = not tested)   CERVICAL MMT:   MMT /5  Flexion 3-  Extension 3-  Right lateral flexion 3-  Left lateral flexion 3-  Right rotation 3-  Left rotation 3-   (Blank rows = not tested)  UPPER EXTREMITY MMT:  MMT Right eval Left eval  Shoulder flexion 4- 4-  Shoulder extension    Shoulder abduction 3+ 4  Shoulder adduction    Shoulder extension    Shoulder internal rotation 3+ 3+  Shoulder external rotation 3+ 3+  Middle trapezius    Lower trapezius    Elbow flexion 3+ 3+  Elbow extension 3- 3-  Wrist flexion 3- 3-  Wrist extension 3- 3-  Wrist ulnar deviation    Wrist radial deviation    Wrist pronation    Wrist supination    Grip strength 10 (avg of 3) 63.3 (avg of 3)   (Blank rows = not  tested)    GAIT: Gait pattern:  slow and unsteady; wife assists in getting pt out of chair and guards as he takes off jacket d/t unsteadiness Assistive device utilized: Single point cane Level of assistance: CGA       GOALS: Goals reviewed with patient? Yes  SHORT TERM GOALS: Target date: 10/27/2022  Patient to be independent with initial HEP. Baseline: HEP initiated Goal status: MET 10/08/22    LONG TERM GOALS: Target date: 11/17/2022  Patient to be independent with advanced HEP. Baseline: Not yet initiated  Goal status: IN PROGRESS  Patient to demonstrate B UE and cervical strength >/=4/5.  Baseline: See above Goal status: IN PROGRESS  Patient to demonstrate resting posture of 7 degrees cervical flexion, 5 degrees R side bending for improved positioning and decreased pain.  Baseline: Resting in 17 deg flexion, 15 deg R sidebending Goal status: IN PROGRESS  Patient to demonstrate cervical AROM WFL and without pain limiting.  Baseline: see above Goal status: IN PROGRESS  Patient to report/demonstrate knowledge of self-management of neck pain at home.  Baseline: Unable Goal status: IN PROGRESS  Patient to score at least 40/56 on [place] in order to decrease risk of falls.  Baseline: NT Goal status: IN PROGRESS   ASSESSMENT:  CLINICAL IMPRESSION:  Activities perfromed primarily in supine due to low BP and focus on manual contact to guide position and facilitate cervical movement in isolation and progress with strength and ROM. Activity tolerance and standing limited due to BP readings and feeling of lightheadedness   OBJECTIVE IMPAIRMENTS: Abnormal gait, decreased activity tolerance, decreased balance, difficulty walking, decreased ROM, decreased strength, dizziness, hypomobility, increased fascial restrictions, increased muscle spasms, impaired flexibility, impaired sensation, impaired UE functional use, improper body mechanics, postural dysfunction, and pain.    ACTIVITY LIMITATIONS: carrying, lifting, bending, standing, squatting, stairs, transfers, bathing, toileting, dressing, self feeding, reach over head, hygiene/grooming, and locomotion level  PARTICIPATION LIMITATIONS: meal prep, cleaning, laundry, shopping, community activity, and church  PERSONAL FACTORS: Age, Behavior pattern, Past/current experiences, Time since onset of injury/illness/exacerbation, and 3+ comorbidities: Pacemaker, anxiety and depression, bradycardia, cervical myelopathy, charcot's foot, DMII, HTN, migraines, HLD, C3-6, ACDF 2020, L LE fasciotomy 2020, L2-4 laminectomy 2022  are also affecting patient's functional outcome.   REHAB POTENTIAL: Good  CLINICAL DECISION MAKING: Evolving/moderate complexity  EVALUATION COMPLEXITY: Moderate  PLAN:  PT FREQUENCY: 2x/week  PT DURATION: 6 weeks  PLANNED INTERVENTIONS: Therapeutic exercises, Therapeutic activity, Neuromuscular re-education, Balance training, Gait training, Patient/Family education, Self Care, Joint mobilization, Stair training, Vestibular training, Canalith repositioning, Aquatic Therapy, Dry Needling, Cryotherapy, Moist heat, Taping, Manual therapy, and Re-evaluation  PLAN FOR NEXT SESSION:  recheck BP; DN to R UT, LS, and possibly rhomboids and thoracic multifidi (could not get a good grip on rhomboids today) ;balance, STM and passive stretching to cervical musculature    10:21 AM, 10/26/22 M. Sherlyn Lees, PT, DPT Physical Therapist- [place] Office Number: [phone]

## 2022-10-28 NOTE — Therapy (Signed)
OUTPATIENT PHYSICAL THERAPY NEURO TREATMENT   Patient Name: Antonio Oliver MRN: [id number] DOB:1955-11-12, 67 y.o., male Today's Date: 10/29/2022   PCP: Cher Nakai, MD  REFERRING PROVIDER: Meredith Staggers, MD  END OF SESSION:  PT End of Session - 10/29/22 1014     Visit Number 7    Number of Visits 13    Date for PT Re-Evaluation 11/17/22    Authorization Type [employer]    Authorization Time Period approved 13 PT visits from 10/06/2022-11/17/2022    Authorization - Visit Number 7    Authorization - Number of Visits 13    PT Start Time 0930    PT Stop Time [id number]    PT Time Calculation (min) 44 min    Activity Tolerance Patient tolerated treatment well    Behavior During Therapy [facility] for tasks assessed/performed                  Past Medical History:  Diagnosis Date   Anxiety and depression    Ascending aorta dilation ([place]) 03/06/2019   4.3 cm 2022   Benign prostatic hyperplasia without lower urinary tract symptoms 11/17/2016   Last Assessment & Plan:  Stable PSA today.  Formatting of this note might be different from the original. Last Assessment & Plan:  Stable PSA today. Last Assessment & Plan:  Formatting of this note might be different from the original. Stable PSA today.   BPH (benign prostatic hyperplasia)    Bradycardia 02/01/2019   Cervical myelopathy ([place]) 11/14/2018   Charcot's joint of foot 03/05/2019   Chronic back pain 04/03/2013   Chronic inflammatory arthritis 11/17/2016   History of positive rheumatoid factor in the past. Denies placement on immunosuppressive therapy   Chronic pain syndrome    Failed back surgical syndrome.  Chronic neck pain.  Chronic low back pain with sciatica bilateral   Colon cancer screening    2023 cologuard neg   Complete heart block (HCC)    Following AVR s/p [employer] Jude PPM in 03/2017   DM2 (diabetes mellitus, type 2) ([place]) 04/03/2013   Last Assessment & Plan:  Formatting of this note might be different from the  original. Diabetes is unchanged.  Continue current treatment regimen. Regular aerobic exercise. Diabetes will be reassessed in 3 months. Will check A1c today. Denies any problems with feet or sensation.   Elevated PSA 11/2021   Enthesopathy of ankle and tarsus 04/04/2013   Last Assessment & Plan:  Continues with swelling after prolonged standing.  Still  Wearing support hose, which helps.   Essential hypertension 10/30/2018   FHx: migraine headaches 04/04/2013   Fracture of capitate bone of wrist 02/26/2019   Fracture of tibia 03/05/2019   GERD (gastroesophageal reflux disease) 10/30/2018   Glaucoma 04/03/2013   Hemangioma of skin and subcutaneous tissue 07/07/2017   History of aortic valve replacement 2018   bicuspid AV   History of kidney stones 11/17/2016   History of sleep apnea    wt loss->gone   Hypothyroidism 10/30/2018   IDA (iron deficiency anemia) 11/2021   malabs d/t gastric bipass   Lesion of plantar nerve 04/04/2013   Lumbar stenosis with neurogenic claudication 01/08/2021   Major depressive disorder, recurrent ([place])    ECT in the remote past.  Patient disabled due to recurrent depression and chronic pain.   Migraine syndrome    Mixed dyslipidemia 10/30/2018   Morbid obesity ([place]) 10/27/2011   Nondisplaced fracture of medial cuneiform of left foot, initial encounter for  closed fracture 11/16/2018   Osteoarthritis 10/30/2018   Personal history of tobacco use, presenting hazards to health 04/04/2013   Presence of permanent cardiac pacemaker    Severe single current episode of major depressive disorder, without psychotic features ([place]) 11/17/2016   Status post bariatric surgery 04/05/2011   bipass surg --presurg wt 310 lbs   Syncope 02/14/2019   Vitamin D deficiency 10/30/2018   Past Surgical History:  Procedure Laterality Date   ANTERIOR CERVICAL DECOMP/DISCECTOMY FUSION N/A 11/17/2018   Procedure: CERVICAL THREE-CERVICAL FOUR, CERVICAL FOUR-CERVICAL FIVE, CERVICAL  FIVE-CERVICAL SIX ANTERIOR CERVICAL DECOMPRESSION/DISCECTOMY FUSION;  Surgeon: Earnie Larsson, MD;  Location: [place];  Service: Neurosurgery;  Laterality: N/A;   AORTIC VALVE REPLACEMENT  [id number]   APPLICATION OF WOUND VAC Left 04/05/2019   Procedure: Application Of Wound Vac;  Surgeon: Erle Crocker, MD;  Location: [place];  Service: Orthopedics;  Laterality: Left;   BACK SURGERY     x4   CARDIOVASCULAR STRESS TEST  05/05/2021   lexiscan neg   FASCIOTOMY Left 04/05/2019   Left leg 2 compartment fasciotomy   FASCIOTOMY Left 04/05/2019   Procedure: FASCIOTOMY LEFT LOWER LEG;  Surgeon: Erle Crocker, MD;  Location: [place];  Service: Orthopedics;  Laterality: Left;   FOOT NEUROMA SURGERY     GASTRIC BYPASS  2012   HEMATOMA EVACUATION Left 04/05/2019   Procedure: Evacuation Hematoma Left Lower Leg;  Surgeon: Erle Crocker, MD;  Location: [place];  Service: Orthopedics;  Laterality: Left;   HEMORRHOID SURGERY     over 30 years ago   [place] [facility]   I & D EXTREMITY Left 04/13/2022   Procedure: IRRIGATION AND DEBRIDEMENT OF ELBOW AND BURSECTOMY;  Surgeon: Iran Planas, MD;  Location: [place];  Service: Orthopedics;  Laterality: Left;   LUMBAR LAMINECTOMY/DECOMPRESSION MICRODISCECTOMY N/A 01/08/2021   Procedure: CENTRAL LUMBAR LAMINECTOMY LUMBAR TWO-THREE, LUMBAR THREE-FOUR;  Surgeon: Jessy Oto, MD;  Location: [place];  Service: Orthopedics;  Laterality: N/A;   NASAL SINUS SURGERY     x2   NECK SURGERY     OLECRANON BURSECTOMY  2023   d/t septic bursitis   TRANSTHORACIC ECHOCARDIOGRAM  12/2020   EF normal, mod MR, ascending AA (4.4 cm)   Patient Active Problem List   Diagnosis Date Noted   Cervical dystonia 08/25/2022   Postlaminectomy syndrome, cervical region 08/25/2022   Septic bursitis 04/16/2022   Right bundle branch block 04/15/2022   Orthostasis 04/15/2022   History of aortic stenosis 04/13/2022   Septic bursitis of elbow 04/12/2022   Diabetes mellitus  type 2 in nonobese ([place]) 04/16/2021   Lumbar stenosis with neurogenic claudication 01/08/2021   Back pain 01/05/2021   BPH (benign prostatic hyperplasia)    Anxiety and depression    Hypertension associated with diabetes ([place])    Presence of permanent cardiac pacemaker 12/28/2019   Ascending aorta dilation ([place]) 03/06/2019   Cervical myelopathy ([place]) 11/14/2018   Hyperlipidemia associated with type 2 diabetes mellitus ([place]) 10/30/2018   Vitamin D deficiency 10/30/2018   Osteoarthritis 10/30/2018   GERD (gastroesophageal reflux disease) 10/30/2018   Hypothyroidism 10/30/2018   Essential hypertension 10/30/2018   Mixed dyslipidemia 10/30/2018   Hemangioma of skin and subcutaneous tissue 07/07/2017   Lentigo 07/07/2017   Multiple benign melanocytic nevi 07/07/2017   Complete heart block ([place]) 05/04/2017   S/P AVR (aortic valve replacement) 03/10/2017   Bicuspid aortic valve 12/22/2016   Benign prostatic hyperplasia without lower urinary tract symptoms  11/17/2016   Chronic inflammatory arthritis 11/17/2016   Chronic bilateral low back pain 11/17/2016   Heart murmur 11/17/2016   History of kidney stones 11/17/2016   Anxiety 06/20/2013   Personal history of tobacco use, presenting hazards to health 04/04/2013   Neck pain 04/04/2013   Status post bariatric surgery 04/04/2013   FHx: migraine headaches 04/04/2013   DM2 (diabetes mellitus, type 2) ([place]) 04/03/2013   Chronic pain 04/03/2013   Glaucoma 04/03/2013   Morbid obesity ([place]) 10/27/2011    ONSET DATE: several years   REFERRING DIAG: G24.3 (ICD-10-CM) - Cervical dystonia  THERAPY DIAG:  Abnormal posture  Cervicalgia  Muscle weakness (generalized)  Dizziness and giddiness  Unsteadiness on feet  Rationale for Evaluation and Treatment: Rehabilitation  SUBJECTIVE:                                                                                                                                                                                              SUBJECTIVE STATEMENT: Feeling okay. No lightheadedness. Denies chest pain or SOB.   Pt accompanied by: wife  PERTINENT HISTORY: Pacemaker, anxiety and depression, bradycardia, cervical myelopathy, charcot's foot, DMII, HTN, migraines, HLD, C3-6, ACDF 2020, L LE fasciotomy 2020, L2-4 laminectomy 2022; pt reports "bad wrist" on R side from cyst that has been removed and has come back  PAIN:  Are you having pain? Yes: NPRS scale: 7-8/10 Pain location: R shoulders and down the arm and into shoulder blade, R side of neck as well, down back, having low back pain as well  Pain description: neck and R shoulder throbbing, lower back constant pain/discomfort  Aggravating factors: movement Relieving factors: pain meds, lidoderm patches  PRECAUTIONS: ICD/Pacemaker  WEIGHT BEARING RESTRICTIONS: No  FALLS: Has patient fallen in last 6 months? Yes. Number of falls wife reports multiple falls  LIVING ENVIRONMENT: Lives with: lives with their spouse Lives in: House/apartment; condo Stairs:  1 step to enter Has following equipment at home: Single point cane, Environmental consultant - 2 wheeled, Wheelchair (manual), Electronics engineer, and Grab bars  PLOF: Independent with basic ADLs  PATIENT GOALS: improve neck pain  OBJECTIVE:     TODAY'S TREATMENT: 10/29/22 Activity Comments  Vitals at start of session  130/18mHg, 86-92% spO2, 73 bpm   Skilled palpation and monitoring of tissues during TPDN Soft tissue restriction and palpation trigger points evident in R thoacic paraspina   Supine pec stretch 2x30" each Manual assist to get into proper position  Supine B horizontal ABD yellow TB 2x10 Slow speed but good form  Supine B shoulder ER yellow TB 2x10 Slow speed but good form  Supine  march and ankle pumps before supine> sit transfer to avoid orthostatic drop  Pt tolerated well and asymptomatic           Trigger Point Dry-Needling  Treatment instructions: Expect mild to moderate  muscle soreness. S/S of pneumothorax if dry needled over a lung field, and to seek immediate medical attention should they occur. Patient verbalized understanding of these instructions and education.  Patient Consent Given: Yes Education handout provided: Previously provided Muscles treated: R rhomboids, R thoracic multifidi T4-6, R UT using standard technique  Electrical stimulation performed: No Parameters: N/A Treatment response/outcome: pt tolerated well   PATIENT EDUCATION: Education details: post DN sx/side effects, discussion on HEP and how to perform rows with minimal space Person educated: Patient Education method: Explanation Education comprehension: verbalized understanding    HOME EXERCISE PROGRAM Last updated: 10/15/22 Access Code: [email or web address] URL: [email or web address] Date: 10/15/2022 Prepared by: [facility]  Exercises - Seated Cervical Rotation AROM  - 1 x daily - 5 x weekly - 2 sets - 10 reps - Seated Cervical Extension AROM  - 1 x daily - 5 x weekly - 2 sets - 10 reps - Seated Cervical Sidebending AROM  - 1 x daily - 5 x weekly - 2 sets - 10 reps - Seated Scapular Retraction  - 1 x daily - 5 x weekly - 2 sets - 10 reps - 3 sec hold - Putty Squeezes  - 2 x daily - 7 x weekly - Seated Shoulder Row with Anchored Resistance  - 1 x daily - 5 x weekly - 2 sets - 10 reps - Seated Shoulder Extension and Scapular Retraction with Resistance  - 1 x daily - 5 x weekly - 2 sets - 10 reps - Narrow Stance with Counter Support  - 1 x daily - 5 x weekly - 2-3 sets - 30 sec hold - Standing Toe Taps  - 1 x daily - 5 x weekly - 2 sets - 10 reps - Cervical Extension AROM with Strap  - 1 x daily - 7 x weekly - 3 sets - 10 reps     Below measures were taken at time of initial evaluation unless otherwise specified:   DIAGNOSTIC FINDINGS: 06/22/22 cervical MRI: Stable postoperative changes from C3-C6 with anterior and interbody  fusion hardware.  Stable degenerative subluxation at C6-7 with a bulging uncovered Disc. Small foci of increased T2 signal intensity in the spinal cord at C3-4 suggesting chronic ischemic changes/myelomalacia  COGNITION: Overall cognitive status: Within functional limits for tasks assessed   SENSATION: Diminished sensation in R arm and 5th digit, R thigh and foot  COORDINATION: Alternating pronation/supination: slow B Alternating toe tap: WNL B Finger to nose: WNL B   POSTURE: resting in R cervical sidebending and neck flexion; rounded shoulders and slight thoracic kyphosis  Resting in 17 deg flexion, 15 deg R sidebending   CERVICAL ROM:   Active ROM A/PROM (deg) eval  Flexion 45 *pain  Extension 4 *pain  Right lateral flexion 28 *pain  Left lateral flexion -10 *pain  Right rotation 37 * pain  Left rotation 40 *pain   (Blank rows = not tested)   CERVICAL MMT:   MMT /5  Flexion 3-  Extension 3-  Right lateral flexion 3-  Left lateral flexion 3-  Right rotation 3-  Left rotation 3-   (Blank rows = not tested)  UPPER EXTREMITY MMT:  MMT Right eval Left eval  Shoulder flexion 4-  4-  Shoulder extension    Shoulder abduction 3+ 4  Shoulder adduction    Shoulder extension    Shoulder internal rotation 3+ 3+  Shoulder external rotation 3+ 3+  Middle trapezius    Lower trapezius    Elbow flexion 3+ 3+  Elbow extension 3- 3-  Wrist flexion 3- 3-  Wrist extension 3- 3-  Wrist ulnar deviation    Wrist radial deviation    Wrist pronation    Wrist supination    Grip strength 10 (avg of 3) 63.3 (avg of 3)   (Blank rows = not tested)    GAIT: Gait pattern:  slow and unsteady; wife assists in getting pt out of chair and guards as he takes off jacket d/t unsteadiness Assistive device utilized: Single point cane Level of assistance: CGA       GOALS: Goals reviewed with patient? Yes  SHORT TERM GOALS: Target date: 10/27/2022  Patient to be independent  with initial HEP. Baseline: HEP initiated Goal status: MET 10/08/22    LONG TERM GOALS: Target date: 11/17/2022  Patient to be independent with advanced HEP. Baseline: Not yet initiated  Goal status: IN PROGRESS  Patient to demonstrate B UE and cervical strength >/=4/5.  Baseline: See above Goal status: IN PROGRESS  Patient to demonstrate resting posture of 7 degrees cervical flexion, 5 degrees R side bending for improved positioning and decreased pain.  Baseline: Resting in 17 deg flexion, 15 deg R sidebending Goal status: IN PROGRESS  Patient to demonstrate cervical AROM WFL and without pain limiting.  Baseline: see above Goal status: IN PROGRESS  Patient to report/demonstrate knowledge of self-management of neck pain at home.  Baseline: Unable Goal status: IN PROGRESS  Patient to score at least 40/56 on [place] in order to decrease risk of falls.  Baseline: NT Goal status: IN PROGRESS   ASSESSMENT:  CLINICAL IMPRESSION: Patient arrived to session without new complaints. BP WFL for exercise and patient asymptomatic, thus proceeded with session. SpO2 slightly low, thus encouraged pt to keep track of this at home and notify MD if <90 %. After educating patient on dry needling benefits, precautions, and possible side effects including needling over lung field, and receiving verbal consent, patient tolerated DN to R periscapular and neck musculature. Patient tolerated this well. Proceeded with gentle periscapular strengthening and chest stretching. Patient was able to transfer from mat without sx of lightheadedness and reported improvement in neck pain upon leaving.    OBJECTIVE IMPAIRMENTS: Abnormal gait, decreased activity tolerance, decreased balance, difficulty walking, decreased ROM, decreased strength, dizziness, hypomobility, increased fascial restrictions, increased muscle spasms, impaired flexibility, impaired sensation, impaired UE functional use, improper body mechanics,  postural dysfunction, and pain.   ACTIVITY LIMITATIONS: carrying, lifting, bending, standing, squatting, stairs, transfers, bathing, toileting, dressing, self feeding, reach over head, hygiene/grooming, and locomotion level  PARTICIPATION LIMITATIONS: meal prep, cleaning, laundry, shopping, community activity, and church  PERSONAL FACTORS: Age, Behavior pattern, Past/current experiences, Time since onset of injury/illness/exacerbation, and 3+ comorbidities: Pacemaker, anxiety and depression, bradycardia, cervical myelopathy, charcot's foot, DMII, HTN, migraines, HLD, C3-6, ACDF 2020, L LE fasciotomy 2020, L2-4 laminectomy 2022  are also affecting patient's functional outcome.   REHAB POTENTIAL: Good  CLINICAL DECISION MAKING: Evolving/moderate complexity  EVALUATION COMPLEXITY: Moderate  PLAN:  PT FREQUENCY: 2x/week  PT DURATION: 6 weeks  PLANNED INTERVENTIONS: Therapeutic exercises, Therapeutic activity, Neuromuscular re-education, Balance training, Gait training, Patient/Family education, Self Care, Joint mobilization, Stair training, Vestibular training, Canalith repositioning, Aquatic Therapy, Dry Needling, Cryotherapy,  Moist heat, Taping, Manual therapy, and Re-evaluation  PLAN FOR NEXT SESSION: recheck BP; DN to R UT, LS, and possibly rhomboids and thoracic multifidi ;balance, STM and passive stretching to cervical musculature    Janene Harvey, PT, DPT 10/29/22 10:17 AM  [facility] at [facility] [address], [place], [place] [address] Phone # [phone] Fax # [phone]

## 2022-10-29 ENCOUNTER — Ambulatory Visit (INDEPENDENT_AMBULATORY_CARE_PROVIDER_SITE_OTHER): Payer: Medicare PPO | Admitting: Family

## 2022-10-29 ENCOUNTER — Encounter (HOSPITAL_BASED_OUTPATIENT_CLINIC_OR_DEPARTMENT_OTHER): Payer: Self-pay | Admitting: Family

## 2022-10-29 ENCOUNTER — Ambulatory Visit: Payer: Medicare PPO | Admitting: Physical Therapy

## 2022-10-29 ENCOUNTER — Encounter: Payer: Self-pay | Admitting: Physical Therapy

## 2022-10-29 VITALS — BP 172/90 | HR 80 | Ht 75.0 in | Wt 194.0 lb

## 2022-10-29 DIAGNOSIS — Z952 Presence of prosthetic heart valve: Secondary | ICD-10-CM

## 2022-10-29 DIAGNOSIS — R2681 Unsteadiness on feet: Secondary | ICD-10-CM

## 2022-10-29 DIAGNOSIS — R293 Abnormal posture: Secondary | ICD-10-CM | POA: Diagnosis not present

## 2022-10-29 DIAGNOSIS — R42 Dizziness and giddiness: Secondary | ICD-10-CM | POA: Diagnosis not present

## 2022-10-29 DIAGNOSIS — M6281 Muscle weakness (generalized): Secondary | ICD-10-CM

## 2022-10-29 DIAGNOSIS — M542 Cervicalgia: Secondary | ICD-10-CM

## 2022-10-29 DIAGNOSIS — R030 Elevated blood-pressure reading, without diagnosis of hypertension: Secondary | ICD-10-CM

## 2022-10-29 DIAGNOSIS — I7121 Aneurysm of the ascending aorta, without rupture: Secondary | ICD-10-CM | POA: Diagnosis not present

## 2022-10-29 NOTE — Progress Notes (Signed)
Office Visit    Patient Name: Antonio Oliver Date of Encounter: 10/29/2022  PCP:  Cher Nakai, MD   [facility]  Cardiologist:  Kirk Ruths, MD  Advanced Practice Provider:  No care team member to display Electrophysiologist:  [name], MD      Chief Complaint    Antonio Oliver is a 67 y.o. male presents today for ED follow up   Past Medical History    Past Medical History:  Diagnosis Date   Anxiety and depression    Ascending aorta dilation ([place]) 03/06/2019   4.3 cm 2022   Benign prostatic hyperplasia without lower urinary tract symptoms 11/17/2016   Last Assessment & Plan:  Stable PSA today.  Formatting of this note might be different from the original. Last Assessment & Plan:  Stable PSA today. Last Assessment & Plan:  Formatting of this note might be different from the original. Stable PSA today.   BPH (benign prostatic hyperplasia)    Bradycardia 02/01/2019   Cervical myelopathy ([place]) 11/14/2018   Charcot's joint of foot 03/05/2019   Chronic back pain 04/03/2013   Chronic inflammatory arthritis 11/17/2016   History of positive rheumatoid factor in the past. Denies placement on immunosuppressive therapy   Chronic pain syndrome    Failed back surgical syndrome.  Chronic neck pain.  Chronic low back pain with sciatica bilateral   Colon cancer screening    2023 cologuard neg   Complete heart block (HCC)    Following AVR s/p [employer] PPM in 03/2017   DM2 (diabetes mellitus, type 2) ([place]) 04/03/2013   Last Assessment & Plan:  Formatting of this note might be different from the original. Diabetes is unchanged.  Continue current treatment regimen. Regular aerobic exercise. Diabetes will be reassessed in 3 months. Will check A1c today. Denies any problems with feet or sensation.   Elevated PSA 11/2021   Enthesopathy of ankle and tarsus 04/04/2013   Last Assessment & Plan:  Continues with swelling after prolonged standing.  Still  Wearing  support hose, which helps.   Essential hypertension 10/30/2018   FHx: migraine headaches 04/04/2013   Fracture of capitate bone of wrist 02/26/2019   Fracture of tibia 03/05/2019   GERD (gastroesophageal reflux disease) 10/30/2018   Glaucoma 04/03/2013   Hemangioma of skin and subcutaneous tissue 07/07/2017   History of aortic valve replacement 2018   bicuspid AV   History of kidney stones 11/17/2016   History of sleep apnea    wt loss->gone   Hypothyroidism 10/30/2018   IDA (iron deficiency anemia) 11/2021   malabs d/t gastric bipass   Lesion of plantar nerve 04/04/2013   Lumbar stenosis with neurogenic claudication 01/08/2021   Major depressive disorder, recurrent ([place])    ECT in the remote past.  Patient disabled due to recurrent depression and chronic pain.   Migraine syndrome    Mixed dyslipidemia 10/30/2018   Morbid obesity ([place]) 10/27/2011   Nondisplaced fracture of medial cuneiform of left foot, initial encounter for closed fracture 11/16/2018   Osteoarthritis 10/30/2018   Personal history of tobacco use, presenting hazards to health 04/04/2013   Presence of permanent cardiac pacemaker    Severe single current episode of major depressive disorder, without psychotic features ([place]) 11/17/2016   Status post bariatric surgery 04/05/2011   bipass surg --presurg wt 310 lbs   Syncope 02/14/2019   Vitamin D deficiency 10/30/2018   Past Surgical History:  Procedure Laterality Date   ANTERIOR CERVICAL  DECOMP/DISCECTOMY FUSION N/A 11/17/2018   Procedure: CERVICAL THREE-CERVICAL FOUR, CERVICAL FOUR-CERVICAL FIVE, CERVICAL FIVE-CERVICAL SIX ANTERIOR CERVICAL DECOMPRESSION/DISCECTOMY FUSION;  Surgeon: Earnie Larsson, MD;  Location: [facility];  Service: Neurosurgery;  Laterality: N/A;   AORTIC VALVE REPLACEMENT  [id number]   APPLICATION OF WOUND VAC Left 04/05/2019   Procedure: Application Of Wound Vac;  Surgeon: Erle Crocker, MD;  Location: [place] [place];  Service: Orthopedics;  Laterality:  Left;   BACK SURGERY     x4   CARDIOVASCULAR STRESS TEST  05/05/2021   lexiscan neg   FASCIOTOMY Left 04/05/2019   Left leg 2 compartment fasciotomy   FASCIOTOMY Left 04/05/2019   Procedure: FASCIOTOMY LEFT LOWER LEG;  Surgeon: Erle Crocker, MD;  Location: [place];  Service: Orthopedics;  Laterality: Left;   FOOT NEUROMA SURGERY     GASTRIC BYPASS  2012   HEMATOMA EVACUATION Left 04/05/2019   Procedure: Evacuation Hematoma Left Lower Leg;  Surgeon: Erle Crocker, MD;  Location: [facility];  Service: Orthopedics;  Laterality: Left;   HEMORRHOID SURGERY     over 30 years ago   [place] umb   I & D EXTREMITY Left 04/13/2022   Procedure: IRRIGATION AND DEBRIDEMENT OF ELBOW AND BURSECTOMY;  Surgeon: Iran Planas, MD;  Location: [place];  Service: Orthopedics;  Laterality: Left;   LUMBAR LAMINECTOMY/DECOMPRESSION MICRODISCECTOMY N/A 01/08/2021   Procedure: CENTRAL LUMBAR LAMINECTOMY LUMBAR TWO-THREE, LUMBAR THREE-FOUR;  Surgeon: Jessy Oto, MD;  Location: [facility];  Service: Orthopedics;  Laterality: N/A;   NASAL SINUS SURGERY     x2   NECK SURGERY     OLECRANON BURSECTOMY  2023   d/t septic bursitis   TRANSTHORACIC ECHOCARDIOGRAM  12/2020   EF normal, mod MR, ascending AA (4.4 cm)    Allergies  Allergies  Allergen Reactions   Calcium Carbonate Other (See Comments)    Pt had gastric bypass, can not absorb calcium carbonate - able to absorb calcium citrate   Ms Contin [Morphine] Itching    Can take with Benadryl, facial and nose itching     History of Present Illness    Antonio Oliver is a 67 y.o. male with a hx of s/p AVR 2018, CHB following AVR with placement of PPM, thoracic aortic aneurysm, ascending aortic dilation, RBBB, GERD, DM2, HLD, hypothyroidism, BPH, anxiety, depression, dyslipidemia, septic bursitis, cervical myelopathy, chronic pain last seen 10/25/22.  History of bicuspid aortic valve and underwent AVR with subsequent PPM due to CHB in 2018.   Nuclear study 2022 with no ischemia.  Repeat echo 12/2021 LVEF 60 to 65%, moderate hypertrophy of basal septum, remainder of LV segment demonstrated mild LVH, thickening of MV leaflets with moderate posteriorly directed MR, trivial AI.  Aortic root 37 mm, ascending aorta 43 mm.  Renal Doppler 2023 1-59% stenosis of the right kidney and no stenosis to the left.  Saw Dr. [name] 07/2022 noting continued episodes of dizziness, near syncope followed by weakness.  He was being evaluated by neurology for dizzy spells and not felt to be cardiac in nature.  He saw neurology Dr. Tomi Likens 09/14/2022 for workup of dysautonomia, orthostatic hypotension, parkinsonism.  It was suspected to be due to [place] and was recommended to talk with prescribing provider about tapering off.  Seen in clinic 10/25/2022.  Blood pressure was markedly hypotensive with systolic 54 despite only taking metoprolol that morning.  He was transported to the emergency department for further evaluation. ED visit 10/25/22.  They  were unable to interrogate his pacer.  Workup included troponin, CBG, magnesium, TSH, T4, CMP which were unremarkable.  CT head and chest x-ray with no acute findings.  Was recommended for evaluation in syncope clinic at [facility] and provided their information.  Presents today for follow-up with his wife.Notes blood pressures has still been up and down. He has only had to take his Hydralazine once. Bp as low as 80s/50s at home. Drinks three 30 oz Yeti cups of tea. He does drink caffeinated tea and discussed to switch to decaf. Trialed off the Rexulti and did not note much difference in dizziness. Trialed off mutliple seratragenic agents for 2-3 weeks without improvement in symptoms. Since last seen has been diagnosed with chronic seratonin syndrome. Notes vision changes with lightheadedness such as a fog and curtain closing. Lightheadedness described as needing to sit down but also as spinning. No chest pain, worsening dyspnea. LE edema  will controlled with compression stockings. Using PRN Lasix only about once per month.  EKGs/Labs/Other Studies Reviewed:   The following studies were reviewed today:  Renal duplex 02/03/22  Right: Normal size right kidney. Normal right Resistive Index.         Normal cortical thickness of right kidney. 1-59% stenosis of         the right renal artery. RRV flow present.  Left:  Normal size of left kidney. Normal left Resistive Index.         Normal cortical thickness of the left kidney. No evidence of         left renal artery stenosis. LRV flow present. Cyst(s) noted.         Avascular cystic lesion in the upper pole of the left kidney,         measuring 2.5 cm in length and 1.9 cm in width. Probable left         renal artery nephrocalcinosis vs nephrolithiasis with         comet-tail artifact noted.  Echo 12/08/21   1. Left ventricular ejection fraction, by estimation, is 60 to 65%. The  left ventricle has normal function. The left ventricle has no regional  wall motion abnormalities. There is moderate hypertrophy of the basal  septum. The rest of the LV segments  demonstrate mild left ventricular hypertrophy. Left ventricular diastolic  parameters were normal.   2. Right ventricular systolic function is low normal. The right  ventricular size is normal. There is normal pulmonary artery systolic  pressure. The estimated right ventricular systolic pressure is 0000000 mmHg.   3. The mitral valve is abnormal. There is thickening of the mitral valve  leaflets, most notably of the anterior mitral valve leaflet that has been  noted on prior studies. There is moderate, posteriorly directed mitral  valve regurgitation.   4. The aortic valve has been repaired/replaced. There is a bioprosthetic  valve present in the aortic position. Aortic valve mean gradient measures  11.0 mmHg. Aortic valve Vmax measures 2.32 m/s. DI 0.5. There is trivial  aortic regurgitation. No evidence   of aortic  stenosis.   5. Aortic dilatation noted. There is borderline dilatation of the aortic  root, measuring 37 mm. There is mild dilatation of the ascending aorta,  measuring 43 mm.   6. The inferior vena cava is normal in size with greater than 50%  respiratory variability, suggesting right atrial pressure of 3 mmHg.   Comparison(s): Compared to prior TTE in 12/2020, there continues to be a  normal functioning aortic  valve bioprosthesis (prior mean gradient  8.67mHg). The ascending aorta is stable in size at 413m(previously 4449m   CT aorta 12/2021 Stable appearance of the ascending aorta, maximal diameter 4.3 cm. Recommend annual imaging followup by CTA or MRA. This recommendation follows 2010 ACCF/AHA/AATS/ACR/ASA/SCA/SCAI/SIR/STS/SVM Guidelines for the Diagnosis and Management of Patients with Thoracic Aortic Disease. Circulation. 2010; 121: [id number] aneurysm NOS (ICD10-I71.9)    EKG:  EKG is not ordered today.    Recent Labs: 03/31/2022: BNP 78.4 10/25/2022: ALT 14; BUN 20; Creatinine, Ser 1.20; Hemoglobin 10.6; Magnesium 2.3; Platelets 233; Potassium 5.0; Sodium 140; TSH 1.833  Recent Lipid Panel    Component Value Date/Time   CHOL 154 11/23/2021 1437   CHOL 161 07/12/2019 0910   TRIG 129.0 11/23/2021 1437   HDL 56.50 11/23/2021 1437   HDL 62 07/12/2019 0910   CHOLHDL 3 11/23/2021 1437   VLDL 25.8 11/23/2021 1437   LDLCALC 71 11/23/2021 1437   LDLCALC 81 07/12/2019 0910     Home Medications   Current Meds  Medication Sig   aspirin EC 81 MG tablet Take 81 mg by mouth daily.   atorvastatin (LIPITOR) 20 MG tablet Take 20 mg by mouth daily at 6 PM.    brexpiprazole (REXULTI) 2 MG TABS tablet Take 2 mg by mouth daily.   CALCIUM CITRATE PO Take 500 mg by mouth 2 (two) times daily.   Cholecalciferol (VITAMIN D-3 PO) Take 1 capsule by mouth daily.   clobetasol (TEMOVATE) 0.05 % external solution Apply topically.   clonazePAM (KLONOPIN) 0.5 MG tablet Take 0.5 mg by  mouth 2 (two) times daily as needed for anxiety.   diclofenac Sodium (VOLTAREN) 1 % GEL Apply 1 application topically 4 (four) times daily.   docusate sodium (COLACE) 100 MG capsule Take 1 capsule (100 mg total) by mouth 2 (two) times daily.   DULoxetine (CYMBALTA) 60 MG capsule Take 60 mg by mouth 2 (two) times daily.    ferrous sulfate 325 (65 FE) MG EC tablet Take 325 mg by mouth daily.   finasteride (PROSCAR) 5 MG tablet Take 5 mg by mouth daily.   fluticasone (FLONASE) 50 MCG/ACT nasal spray Place 2 sprays into the nose daily as needed for allergies or rhinitis.   folic acid (FOLVITE) 1 MG tablet Take 1 mg by mouth daily.   furosemide (LASIX) 20 MG tablet Take 1 tablet (20 mg total) by mouth as needed (for leg swelling).   Galcanezumab-gnlm (EMGALITY) 120 MG/ML SOAJ Inject 120 mg into the skin every 28 (twenty-eight) days.   hydrALAZINE (APRESOLINE) 25 MG tablet Take 0.5 tablets (12.5 mg total) by mouth 2 (two) times daily as needed.   levothyroxine (SYNTHROID) 125 MCG tablet Take 125 mcg by mouth daily.    lidocaine (LIDODERM) 5 % SMARTSIG:Topical   loratadine (CLARITIN) 10 MG tablet Take 10 mg by mouth daily as needed for allergies.   MAGNESIUM PO Take 1 tablet by mouth daily.   meclizine (ANTIVERT) 25 MG tablet Take 25 mg by mouth 3 (three) times daily as needed for dizziness.   metFORMIN (GLUCOPHAGE) 1000 MG tablet Take 1,000 mg by mouth 2 (two) times daily with a meal.   metoprolol succinate (TOPROL-XL) 25 MG 24 hr tablet Take 25 mg by mouth in the morning and at bedtime.   Multiple Vitamins-Minerals (MULTIVITAMIN WITH MINERALS) tablet Take 1 tablet by mouth daily.   Multiple Vitamins-Minerals (PRESERVISION/LUTEIN) CAPS Take 1 capsule by mouth at bedtime.   mupirocin ointment (BACTROBAN) 2 % Apply  1 application topically 3 (three) times daily as needed (infection).   naloxone (NARCAN) nasal spray 4 mg/0.1 mL Instill or spray into the nose if you are experiencing shortness of breath or  signs of opioid overdose.   omeprazole (PRILOSEC) 20 MG capsule Take 20 mg by mouth 2 (two) times daily before a meal.    oxyCODONE-acetaminophen (PERCOCET) 10-325 MG tablet Take 0.5 tablets by mouth See admin instructions. 1/2 tablet every 3 hours.   polyethylene glycol (MIRALAX / GLYCOLAX) 17 g packet Take 17 g by mouth daily as needed.   potassium chloride (KLOR-CON) 10 MEQ tablet Take 10 mEq by mouth as needed (with lasix dose).   pregabalin (LYRICA) 100 MG capsule Take 100 mg by mouth 3 (three) times daily.   Semaglutide,0.25 or 0.'5MG'$ /DOS, (OZEMPIC, 0.25 OR 0.5 MG/DOSE,) 2 MG/3ML SOPN Inject 0.5 mg into the skin once a week.   silodosin (RAPAFLO) 8 MG CAPS capsule Take 8 mg by mouth daily.   solifenacin (VESICARE) 10 MG tablet Take 10 mg by mouth at bedtime.   tiZANidine (ZANAFLEX) 4 MG tablet Take 4 mg by mouth 3 (three) times daily.   Ubrogepant (UBRELVY) 100 MG TABS Take 1 tablet by mouth as needed (May repeat in 2 hours.  Maximum 2 tablets in 24 hours.).     Review of Systems      All other systems reviewed and are otherwise negative except as noted above.  Physical Exam    VS:  BP (!) 172/90   Pulse 80   Ht '6\' 3"'$  (1.905 m)   Wt 194 lb (88 kg)   BMI 24.25 kg/m  , BMI Body mass index is 24.25 kg/m.  Wt Readings from Last 3 Encounters:  10/29/22 194 lb (88 kg)  10/25/22 193 lb (87.5 kg)  09/22/22 193 lb (87.5 kg)     GEN: Well nourished, overweight, well developed, in no acute distress. HEENT: normal. Neck: Supple, no JVD, carotid bruits, or masses. Cardiac: RRR, no murmurs, rubs, or gallops. No clubbing, cyanosis, edema.  Radials/PT 2+ and equal bilaterally.  Respiratory:  Respirations regular and unlabored, clear to auscultation bilaterally. GI: Soft, nontender, nondistended. MS: No deformity or atrophy. Skin: Warm and dry, no rash. Neuro:  Strength and sensation are intact. Psych: Normal affect.  Assessment & Plan    Lightheadedness/Dizziness - Longstanding  issue. Update echo, as below. Has followed with neurology - no acute findings. Reports some vision changes and amaurosis fugax, carotid duplex ordered.   Bicuspid AV s/p AVR- Update echo for monitoring.   Labile blood pressure-continue metoprolol succinate 25 mg twice daily. Continue Hydralazine 12.'5mg'$  PRN for elevated BP. Hesitant to add daily agent due to concomitant hypotension. Likely multifactorial poor PO intake (discussed reducing caffeine intake), parkinsonism. Update echo to reassess AV. 24h BP montior ordered. Wife notes she needs FLMA for job projection related to these episodes - she will fax request and also encouraged to send to PCP.  DM2- Continue to follow with PCP.   CHB s/p PPM-follows with EP.  Ascending aortic dilation-4.3 cm by CT 12/2021.  Repeat CT 12/2022 ordered today.  Continue metoprolol succinate 25 mg twice daily.  HLD-continue atorvastatin 20 mg daily.  Migraine-follows with neurology.        Disposition: Follow up in 3 month(s) with Kirk Ruths, MD or APP.  Signed, Loel Dubonnet, NP 10/29/2022, 2:38 PM [place]

## 2022-10-29 NOTE — Patient Instructions (Signed)
Medication Instructions:  Continue your current medications.   *If you need a refill on your cardiac medications before your next appointment, please call your pharmacy*   Lab Work: None ordered today. NONE If you have labs (blood work) drawn today and your tests are completely normal, you will receive your results only by: Page (if you have MyChart) OR A paper copy in the mail If you have any lab test that is abnormal or we need to change your treatment, we will call you to review the results.   Testing/Procedures: Your physician has requested that you have an echocardiogram. Echocardiography is a painless test that uses sound waves to create images of your heart. It provides your doctor with information about the size and shape of your heart and how well your heart's chambers and valves are working. This procedure takes approximately one hour. There are no restrictions for this procedure. Please do NOT wear cologne, perfume, aftershave, or lotions (deodorant is allowed). Please arrive 15 minutes prior to your appointment time.   Your physician has requested that you have a carotid duplex. This test is an ultrasound of the carotid arteries in your neck. It looks at blood flow through these arteries that supply the brain with blood. Allow one hour for this exam. There are no restrictions or special instructions.   Your provider has ordered a 24h blood pressure monitor.   Your provider has ordered a CT Aorta for 12/2022.  Follow-Up: At [facility], you and your health needs are our priority.  As part of our continuing mission to provide you with exceptional heart care, we have created designated Provider Care Teams.  These Care Teams include your primary Cardiologist (physician) and Advanced Practice Providers (APPs -  Physician Assistants and Nurse Practitioners) who all work together to provide you with the care you need, when you need it.  We recommend signing up  for the patient portal called "MyChart".  Sign up information is provided on this After Visit Summary.  MyChart is used to connect with patients for Virtual Visits (Telemedicine).  Patients are able to view lab/test results, encounter notes, upcoming appointments, etc.  Non-urgent messages can be sent to your provider as well.   To learn more about what you can do with MyChart, go to [email or web address].    Your next appointment:   3 month(s)  Provider:   Kirk Ruths, MD in [place]   Other Instructions   Recommend having FMLA paperwork faxed to [phone]

## 2022-11-02 ENCOUNTER — Ambulatory Visit: Payer: Medicare PPO | Admitting: Physical Therapy

## 2022-11-02 ENCOUNTER — Telehealth: Payer: Self-pay | Admitting: Cardiology

## 2022-11-02 ENCOUNTER — Encounter: Payer: Self-pay | Admitting: Physical Therapy

## 2022-11-02 DIAGNOSIS — R293 Abnormal posture: Secondary | ICD-10-CM | POA: Diagnosis not present

## 2022-11-02 DIAGNOSIS — M6281 Muscle weakness (generalized): Secondary | ICD-10-CM

## 2022-11-02 DIAGNOSIS — M542 Cervicalgia: Secondary | ICD-10-CM

## 2022-11-02 DIAGNOSIS — I10 Essential (primary) hypertension: Secondary | ICD-10-CM

## 2022-11-02 NOTE — Therapy (Signed)
OUTPATIENT PHYSICAL THERAPY NEURO TREATMENT   Patient Name: Antonio Oliver MRN: [id number] DOB:1955-11-17, 67 y.o., male Today's Date: 11/02/2022   PCP: Cher Nakai, MD  REFERRING PROVIDER: Meredith Staggers, MD  END OF SESSION:  PT End of Session - 11/02/22 0934     Visit Number 8    Number of Visits 13    Date for PT Re-Evaluation 11/17/22    Authorization Type [employer]    Authorization Time Period approved 13 PT visits from 10/06/2022-11/17/2022    Authorization - Visit Number 8    Authorization - Number of Visits 13    PT Start Time 0935    PT Stop Time 1015    PT Time Calculation (min) 40 min    Activity Tolerance Patient tolerated treatment well    Behavior During Therapy [facility] for tasks assessed/performed                  Past Medical History:  Diagnosis Date   Anxiety and depression    Ascending aorta dilation ([place]) 03/06/2019   4.3 cm 2022   Benign prostatic hyperplasia without lower urinary tract symptoms 11/17/2016   Last Assessment & Plan:  Stable PSA today.  Formatting of this note might be different from the original. Last Assessment & Plan:  Stable PSA today. Last Assessment & Plan:  Formatting of this note might be different from the original. Stable PSA today.   BPH (benign prostatic hyperplasia)    Bradycardia 02/01/2019   Cervical myelopathy ([place]) 11/14/2018   Charcot's joint of foot 03/05/2019   Chronic back pain 04/03/2013   Chronic inflammatory arthritis 11/17/2016   History of positive rheumatoid factor in the past. Denies placement on immunosuppressive therapy   Chronic pain syndrome    Failed back surgical syndrome.  Chronic neck pain.  Chronic low back pain with sciatica bilateral   Colon cancer screening    2023 cologuard neg   Complete heart block (HCC)    Following AVR s/p St Jude PPM in 03/2017   DM2 (diabetes mellitus, type 2) ([place]) 04/03/2013   Last Assessment & Plan:  Formatting of this note might be different from the  original. Diabetes is unchanged.  Continue current treatment regimen. Regular aerobic exercise. Diabetes will be reassessed in 3 months. Will check A1c today. Denies any problems with feet or sensation.   Elevated PSA 11/2021   Enthesopathy of ankle and tarsus 04/04/2013   Last Assessment & Plan:  Continues with swelling after prolonged standing.  Still  Wearing support hose, which helps.   Essential hypertension 10/30/2018   FHx: migraine headaches 04/04/2013   Fracture of capitate bone of wrist 02/26/2019   Fracture of tibia 03/05/2019   GERD (gastroesophageal reflux disease) 10/30/2018   Glaucoma 04/03/2013   Hemangioma of skin and subcutaneous tissue 07/07/2017   History of aortic valve replacement 2018   bicuspid AV   History of kidney stones 11/17/2016   History of sleep apnea    wt loss->gone   Hypothyroidism 10/30/2018   IDA (iron deficiency anemia) 11/2021   malabs d/t gastric bipass   Lesion of plantar nerve 04/04/2013   Lumbar stenosis with neurogenic claudication 01/08/2021   Major depressive disorder, recurrent ([place])    ECT in the remote past.  Patient disabled due to recurrent depression and chronic pain.   Migraine syndrome    Mixed dyslipidemia 10/30/2018   Morbid obesity ([place]) 10/27/2011   Nondisplaced fracture of medial cuneiform of left foot, initial encounter for  closed fracture 11/16/2018   Osteoarthritis 10/30/2018   Personal history of tobacco use, presenting hazards to health 04/04/2013   Presence of permanent cardiac pacemaker    Severe single current episode of major depressive disorder, without psychotic features ([place]) 11/17/2016   Status post bariatric surgery 04/05/2011   bipass surg --presurg wt 310 lbs   Syncope 02/14/2019   Vitamin D deficiency 10/30/2018   Past Surgical History:  Procedure Laterality Date   ANTERIOR CERVICAL DECOMP/DISCECTOMY FUSION N/A 11/17/2018   Procedure: CERVICAL THREE-CERVICAL FOUR, CERVICAL FOUR-CERVICAL FIVE, CERVICAL  FIVE-CERVICAL SIX ANTERIOR CERVICAL DECOMPRESSION/DISCECTOMY FUSION;  Surgeon: Earnie Larsson, MD;  Location: [place];  Service: Neurosurgery;  Laterality: N/A;   AORTIC VALVE REPLACEMENT  [id number]   APPLICATION OF WOUND VAC Left 04/05/2019   Procedure: Application Of Wound Vac;  Surgeon: Erle Crocker, MD;  Location: [place];  Service: Orthopedics;  Laterality: Left;   BACK SURGERY     x4   CARDIOVASCULAR STRESS TEST  05/05/2021   lexiscan neg   FASCIOTOMY Left 04/05/2019   Left leg 2 compartment fasciotomy   FASCIOTOMY Left 04/05/2019   Procedure: FASCIOTOMY LEFT LOWER LEG;  Surgeon: Erle Crocker, MD;  Location: [place];  Service: Orthopedics;  Laterality: Left;   FOOT NEUROMA SURGERY     GASTRIC BYPASS  2012   HEMATOMA EVACUATION Left 04/05/2019   Procedure: Evacuation Hematoma Left Lower Leg;  Surgeon: Erle Crocker, MD;  Location: [place];  Service: Orthopedics;  Laterality: Left;   HEMORRHOID SURGERY     over 30 years ago   [place] umb   I & D EXTREMITY Left 04/13/2022   Procedure: IRRIGATION AND DEBRIDEMENT OF ELBOW AND BURSECTOMY;  Surgeon: Iran Planas, MD;  Location: [place];  Service: Orthopedics;  Laterality: Left;   LUMBAR LAMINECTOMY/DECOMPRESSION MICRODISCECTOMY N/A 01/08/2021   Procedure: CENTRAL LUMBAR LAMINECTOMY LUMBAR TWO-THREE, LUMBAR THREE-FOUR;  Surgeon: Jessy Oto, MD;  Location: [place];  Service: Orthopedics;  Laterality: N/A;   NASAL SINUS SURGERY     x2   NECK SURGERY     OLECRANON BURSECTOMY  2023   d/t septic bursitis   TRANSTHORACIC ECHOCARDIOGRAM  12/2020   EF normal, mod MR, ascending AA (4.4 cm)   Patient Active Problem List   Diagnosis Date Noted   Cervical dystonia 08/25/2022   Postlaminectomy syndrome, cervical region 08/25/2022   Septic bursitis 04/16/2022   Right bundle branch block 04/15/2022   Orthostasis 04/15/2022   History of aortic stenosis 04/13/2022   Septic bursitis of elbow 04/12/2022   Diabetes mellitus  type 2 in nonobese ([place]) 04/16/2021   Lumbar stenosis with neurogenic claudication 01/08/2021   Back pain 01/05/2021   BPH (benign prostatic hyperplasia)    Anxiety and depression    Hypertension associated with diabetes ([place])    Presence of permanent cardiac pacemaker 12/28/2019   Ascending aorta dilation ([place]) 03/06/2019   Cervical myelopathy ([place]) 11/14/2018   Hyperlipidemia associated with type 2 diabetes mellitus ([place]) 10/30/2018   Vitamin D deficiency 10/30/2018   Osteoarthritis 10/30/2018   GERD (gastroesophageal reflux disease) 10/30/2018   Hypothyroidism 10/30/2018   Essential hypertension 10/30/2018   Mixed dyslipidemia 10/30/2018   Hemangioma of skin and subcutaneous tissue 07/07/2017   Lentigo 07/07/2017   Multiple benign melanocytic nevi 07/07/2017   Complete heart block ([place]) 05/04/2017   S/P AVR (aortic valve replacement) 03/10/2017   Bicuspid aortic valve 12/22/2016   Benign prostatic hyperplasia without lower urinary tract symptoms  11/17/2016   Chronic inflammatory arthritis 11/17/2016   Chronic bilateral low back pain 11/17/2016   Heart murmur 11/17/2016   History of kidney stones 11/17/2016   Anxiety 06/20/2013   Personal history of tobacco use, presenting hazards to health 04/04/2013   Neck pain 04/04/2013   Status post bariatric surgery 04/04/2013   FHx: migraine headaches 04/04/2013   DM2 (diabetes mellitus, type 2) ([place]) 04/03/2013   Chronic pain 04/03/2013   Glaucoma 04/03/2013   Morbid obesity ([place]) 10/27/2011    ONSET DATE: several years   REFERRING DIAG: G24.3 (ICD-10-CM) - Cervical dystonia  THERAPY DIAG:  Abnormal posture  Cervicalgia  Muscle weakness (generalized)  Rationale for Evaluation and Treatment: Rehabilitation  SUBJECTIVE:                                                                                                                                                                                             SUBJECTIVE  STATEMENT: Wife reports continues to have BP issues.  If I wasn't with him to the car today, he would have fallen.  Felt like legs would give way.  No falls.  Pt accompanied by: wife  PERTINENT HISTORY: Pacemaker, anxiety and depression, bradycardia, cervical myelopathy, charcot's foot, DMII, HTN, migraines, HLD, C3-6, ACDF 2020, L LE fasciotomy 2020, L2-4 laminectomy 2022; pt reports "bad wrist" on R side from cyst that has been removed and has come back  PAIN:  Are you having pain? Yes: NPRS scale: 8/10 Pain location: R shoulders and down the arm and into shoulder blade, R side of neck as well, down back, having low back pain as well  Pain description: neck and R shoulder throbbing, lower back constant pain/discomfort  Aggravating factors: movement Relieving factors: pain meds, lidoderm patches  PRECAUTIONS: ICD/Pacemaker  WEIGHT BEARING RESTRICTIONS: No  FALLS: Has patient fallen in last 6 months? Yes. Number of falls wife reports multiple falls  LIVING ENVIRONMENT: Lives with: lives with their spouse Lives in: House/apartment; condo Stairs:  1 step to enter Has following equipment at home: Single point cane, Environmental consultant - 2 wheeled, Wheelchair (manual), Electronics engineer, and Grab bars  PLOF: Independent with basic ADLs  PATIENT GOALS: improve neck pain  OBJECTIVE:   Pt requests to review stretch/postural strengthening portion of HEP  TODAY'S TREATMENT: 11/02/2022 Activity Comments  Vitals at start of session 109/79, HR 63; O2 sats 93%  Reviewed HEP A/ROM, stretches, and strengthening Good return demo, min cues for neutral posture to start  STM to R upper traps, at trigger points, also demo, use of tennis wall/at wall and double tennis ball, trying to get more superior aspect  of tight area at R shoulder Provided with 2 tennis balls in sleeve for home use  Vitals 101/76   Seated march, 3# BLEs x 10, then yellow band x 10 Seated LAQ, 3# BLEs x 5 reps, then with yellow band, 5 reps  Decreased motion with weight; able to self-correct posture  Seated hip abduction, yellow band, 2 x 10; hip adduction 2 x 5 reps       PATIENT EDUCATION: Education details: Use of tennis balls for massage at home Person educated: Patient and Spouse Education method: Explanation and Demonstration Education comprehension: verbalized understanding    HOME EXERCISE PROGRAM Last updated: 10/15/22 Access Code: [email or web address] URL: [email or web address] Date: 10/15/2022 Prepared by: [facility]  Exercises - Seated Cervical Rotation AROM  - 1 x daily - 5 x weekly - 2 sets - 10 reps - Seated Cervical Extension AROM  - 1 x daily - 5 x weekly - 2 sets - 10 reps - Seated Cervical Sidebending AROM  - 1 x daily - 5 x weekly - 2 sets - 10 reps - Seated Scapular Retraction  - 1 x daily - 5 x weekly - 2 sets - 10 reps - 3 sec hold - Putty Squeezes  - 2 x daily - 7 x weekly - Seated Shoulder Row with Anchored Resistance  - 1 x daily - 5 x weekly - 2 sets - 10 reps - Seated Shoulder Extension and Scapular Retraction with Resistance  - 1 x daily - 5 x weekly - 2 sets - 10 reps - Narrow Stance with Counter Support  - 1 x daily - 5 x weekly - 2-3 sets - 30 sec hold - Standing Toe Taps  - 1 x daily - 5 x weekly - 2 sets - 10 reps - Cervical Extension AROM with Strap  - 1 x daily - 7 x weekly - 3 sets - 10 reps     Below measures were taken at time of initial evaluation unless otherwise specified:   DIAGNOSTIC FINDINGS: 06/22/22 cervical MRI: Stable postoperative changes from C3-C6 with anterior and interbody fusion hardware.  Stable degenerative subluxation at C6-7 with a bulging uncovered Disc. Small foci of increased T2 signal intensity in the spinal cord at C3-4 suggesting chronic ischemic changes/myelomalacia  COGNITION: Overall cognitive status: Within functional limits for tasks assessed   SENSATION: Diminished sensation in R arm and 5th digit, R  thigh and foot  COORDINATION: Alternating pronation/supination: slow B Alternating toe tap: WNL B Finger to nose: WNL B   POSTURE: resting in R cervical sidebending and neck flexion; rounded shoulders and slight thoracic kyphosis  Resting in 17 deg flexion, 15 deg R sidebending   CERVICAL ROM:   Active ROM A/PROM (deg) eval  Flexion 45 *pain  Extension 4 *pain  Right lateral flexion 28 *pain  Left lateral flexion -10 *pain  Right rotation 37 * pain  Left rotation 40 *pain   (Blank rows = not tested)   CERVICAL MMT:   MMT /5  Flexion 3-  Extension 3-  Right lateral flexion 3-  Left lateral flexion 3-  Right rotation 3-  Left rotation 3-   (Blank rows = not tested)  UPPER EXTREMITY MMT:  MMT Right eval Left eval  Shoulder flexion 4- 4-  Shoulder extension    Shoulder abduction 3+ 4  Shoulder adduction    Shoulder extension    Shoulder internal rotation 3+ 3+  Shoulder external rotation 3+ 3+  Middle trapezius    Lower trapezius    Elbow flexion 3+ 3+  Elbow extension 3- 3-  Wrist flexion 3- 3-  Wrist extension 3- 3-  Wrist ulnar deviation    Wrist radial deviation    Wrist pronation    Wrist supination    Grip strength 10 (avg of 3) 63.3 (avg of 3)   (Blank rows = not tested)    GAIT: Gait pattern:  slow and unsteady; wife assists in getting pt out of chair and guards as he takes off jacket d/t unsteadiness Assistive device utilized: Single point cane Level of assistance: CGA       GOALS: Goals reviewed with patient? Yes  SHORT TERM GOALS: Target date: 10/27/2022  Patient to be independent with initial HEP. Baseline: HEP initiated Goal status: MET 10/08/22    LONG TERM GOALS: Target date: 11/17/2022  Patient to be independent with advanced HEP. Baseline: Not yet initiated  Goal status: IN PROGRESS  Patient to demonstrate B UE and cervical strength >/=4/5.  Baseline: See above Goal status: IN PROGRESS  Patient to demonstrate  resting posture of 7 degrees cervical flexion, 5 degrees R side bending for improved positioning and decreased pain.  Baseline: Resting in 17 deg flexion, 15 deg R sidebending Goal status: IN PROGRESS  Patient to demonstrate cervical AROM WFL and without pain limiting.  Baseline: see above Goal status: IN PROGRESS  Patient to report/demonstrate knowledge of self-management of neck pain at home.  Baseline: Unable Goal status: IN PROGRESS  Patient to score at least 40/56 on [place] in order to decrease risk of falls.  Baseline: NT Goal status: IN PROGRESS   ASSESSMENT:  CLINICAL IMPRESSION: BP WFL throughout session today, but wife reports pt almost fell due to likely decereased BP issue when trying to get into the truck to come to therapy.  He requests review of postural exercises; he performs well with minimal cues and reports he does need assist of wife for resistance bands due to positioning difficulties in a doorway in his home.  He does report some benefit from dry needling last visit.  No c/o upon leaving session today.   OBJECTIVE IMPAIRMENTS: Abnormal gait, decreased activity tolerance, decreased balance, difficulty walking, decreased ROM, decreased strength, dizziness, hypomobility, increased fascial restrictions, increased muscle spasms, impaired flexibility, impaired sensation, impaired UE functional use, improper body mechanics, postural dysfunction, and pain.   ACTIVITY LIMITATIONS: carrying, lifting, bending, standing, squatting, stairs, transfers, bathing, toileting, dressing, self feeding, reach over head, hygiene/grooming, and locomotion level  PARTICIPATION LIMITATIONS: meal prep, cleaning, laundry, shopping, community activity, and church  PERSONAL FACTORS: Age, Behavior pattern, Past/current experiences, Time since onset of injury/illness/exacerbation, and 3+ comorbidities: Pacemaker, anxiety and depression, bradycardia, cervical myelopathy, charcot's foot, DMII, HTN,  migraines, HLD, C3-6, ACDF 2020, L LE fasciotomy 2020, L2-4 laminectomy 2022  are also affecting patient's functional outcome.   REHAB POTENTIAL: Good  CLINICAL DECISION MAKING: Evolving/moderate complexity  EVALUATION COMPLEXITY: Moderate  PLAN:  PT FREQUENCY: 2x/week  PT DURATION: 6 weeks  PLANNED INTERVENTIONS: Therapeutic exercises, Therapeutic activity, Neuromuscular re-education, Balance training, Gait training, Patient/Family education, Self Care, Joint mobilization, Stair training, Vestibular training, Canalith repositioning, Aquatic Therapy, Dry Needling, Cryotherapy, Moist heat, Taping, Manual therapy, and Re-evaluation  PLAN FOR NEXT SESSION: recheck BP; DN to R UT, LS, and possibly rhomboids and thoracic multifidi ;balance, STM and passive stretching to cervical musculature    Mady Haagensen, PT 11/02/22 8:41 PM Phone: [phone] Fax: [place] at  [facility] [address] [place], [place] [address] Phone # [phone] Fax # [phone]

## 2022-11-02 NOTE — Telephone Encounter (Signed)
Pt c/o BP issue: STAT if pt c/o blurred vision, one-sided weakness or slurred speech  1. What are your last 5 BP readings?  2/27: 171/113 before meds          102/70 after Hydralazine (about 45 minutes)   2. Are you having any other symptoms (ex. Dizziness, headache, blurred vision, passed out)?  Headaches, lightheaded/dizzy, becomes faint when BP drops  3. What is your BP issue?   Patient's wife states BP has been fluctuating and she is unable to control it when it becomes elevated. She mentions that she has also given the patient extra medication.

## 2022-11-02 NOTE — Telephone Encounter (Signed)
Spoke with pt wife, she is wanting to get guidance regarding the patients blood pressure and his medications. She is checking her blood pressure 5 to 6 times a day because he will complain of a headache. In the last 3 out of 5 days she has given the patient 75 mg of hydralazine. She will give him hydralazine when his diastolic is 95 or greater. He has not had to take furosemide in the last couple months, he has had no swelling. He is taking the metoprolol twice daily. When I suggested a continuous hydralazine dose she told me he had done that before and it made his pressure too low. When I said we may need to let his blood pressure run a little higher she explained she is most worried that his aneurysm will burst. She would like for dr [name] to give her specific instructions regarding his blood pressure and what to do when it is elevated. Will forward for dr [name] review

## 2022-11-03 MED ORDER — HYDRALAZINE HCL 10 MG PO TABS: ORAL_TABLET | ORAL | 3 refills | Status: DC

## 2022-11-03 MED ORDER — LOSARTAN POTASSIUM 25 MG PO TABS: 25.0000 mg | ORAL_TABLET | Freq: Every day | ORAL | 3 refills | Status: DC

## 2022-11-03 NOTE — Telephone Encounter (Signed)
Spoke with pt, Aware of dr Jacalyn Lefevre recommendations. They have losartan. New script sent to the pharmacy for hydralazine Lab orders mailed to the pt

## 2022-11-04 NOTE — Therapy (Incomplete)
OUTPATIENT PHYSICAL THERAPY NEURO TREATMENT   Patient Name: Antonio Oliver MRN: [id number] DOB:07/17/1956, 67 y.o., male Today's Date: 11/04/2022   PCP: Cher Nakai, MD  REFERRING PROVIDER: Meredith Staggers, MD  END OF SESSION:         Past Medical History:  Diagnosis Date   Anxiety and depression    Ascending aorta dilation ([place]) 03/06/2019   4.3 cm 2022   Benign prostatic hyperplasia without lower urinary tract symptoms 11/17/2016   Last Assessment & Plan:  Stable PSA today.  Formatting of this note might be different from the original. Last Assessment & Plan:  Stable PSA today. Last Assessment & Plan:  Formatting of this note might be different from the original. Stable PSA today.   BPH (benign prostatic hyperplasia)    Bradycardia 02/01/2019   Cervical myelopathy ([place]) 11/14/2018   Charcot's joint of foot 03/05/2019   Chronic back pain 04/03/2013   Chronic inflammatory arthritis 11/17/2016   History of positive rheumatoid factor in the past. Denies placement on immunosuppressive therapy   Chronic pain syndrome    Failed back surgical syndrome.  Chronic neck pain.  Chronic low back pain with sciatica bilateral   Colon cancer screening    2023 cologuard neg   Complete heart block (HCC)    Following AVR s/p [employer] Jude PPM in 03/2017   DM2 (diabetes mellitus, type 2) ([place]) 04/03/2013   Last Assessment & Plan:  Formatting of this note might be different from the original. Diabetes is unchanged.  Continue current treatment regimen. Regular aerobic exercise. Diabetes will be reassessed in 3 months. Will check A1c today. Denies any problems with feet or sensation.   Elevated PSA 11/2021   Enthesopathy of ankle and tarsus 04/04/2013   Last Assessment & Plan:  Continues with swelling after prolonged standing.  Still  Wearing support hose, which helps.   Essential hypertension 10/30/2018   FHx: migraine headaches 04/04/2013   Fracture of capitate bone of wrist 02/26/2019    Fracture of tibia 03/05/2019   GERD (gastroesophageal reflux disease) 10/30/2018   Glaucoma 04/03/2013   Hemangioma of skin and subcutaneous tissue 07/07/2017   History of aortic valve replacement 2018   bicuspid AV   History of kidney stones 11/17/2016   History of sleep apnea    wt loss->gone   Hypothyroidism 10/30/2018   IDA (iron deficiency anemia) 11/2021   malabs d/t gastric bipass   Lesion of plantar nerve 04/04/2013   Lumbar stenosis with neurogenic claudication 01/08/2021   Major depressive disorder, recurrent ([place])    ECT in the remote past.  Patient disabled due to recurrent depression and chronic pain.   Migraine syndrome    Mixed dyslipidemia 10/30/2018   Morbid obesity ([place]) 10/27/2011   Nondisplaced fracture of medial cuneiform of left foot, initial encounter for closed fracture 11/16/2018   Osteoarthritis 10/30/2018   Personal history of tobacco use, presenting hazards to health 04/04/2013   Presence of permanent cardiac pacemaker    Severe single current episode of major depressive disorder, without psychotic features ([place]) 11/17/2016   Status post bariatric surgery 04/05/2011   bipass surg --presurg wt 310 lbs   Syncope 02/14/2019   Vitamin D deficiency 10/30/2018   Past Surgical History:  Procedure Laterality Date   ANTERIOR CERVICAL DECOMP/DISCECTOMY FUSION N/A 11/17/2018   Procedure: CERVICAL THREE-CERVICAL FOUR, CERVICAL FOUR-CERVICAL FIVE, CERVICAL FIVE-CERVICAL SIX ANTERIOR CERVICAL DECOMPRESSION/DISCECTOMY FUSION;  Surgeon: Earnie Larsson, MD;  Location: [facility];  Service: Neurosurgery;  Laterality: N/A;  AORTIC VALVE REPLACEMENT  [id number]   APPLICATION OF WOUND VAC Left 04/05/2019   Procedure: Application Of Wound Vac;  Surgeon: Erle Crocker, MD;  Location: [facility];  Service: Orthopedics;  Laterality: Left;   BACK SURGERY     x4   CARDIOVASCULAR STRESS TEST  05/05/2021   lexiscan neg   FASCIOTOMY Left 04/05/2019   Left leg 2 compartment fasciotomy    FASCIOTOMY Left 04/05/2019   Procedure: FASCIOTOMY LEFT LOWER LEG;  Surgeon: Erle Crocker, MD;  Location: [facility];  Service: Orthopedics;  Laterality: Left;   FOOT NEUROMA SURGERY     GASTRIC BYPASS  2012   HEMATOMA EVACUATION Left 04/05/2019   Procedure: Evacuation Hematoma Left Lower Leg;  Surgeon: Erle Crocker, MD;  Location: [facility];  Service: Orthopedics;  Laterality: Left;   HEMORRHOID SURGERY     over 30 years ago   [facility]   I & D EXTREMITY Left 04/13/2022   Procedure: IRRIGATION AND DEBRIDEMENT OF ELBOW AND BURSECTOMY;  Surgeon: Iran Planas, MD;  Location: [facility];  Service: Orthopedics;  Laterality: Left;   LUMBAR LAMINECTOMY/DECOMPRESSION MICRODISCECTOMY N/A 01/08/2021   Procedure: CENTRAL LUMBAR LAMINECTOMY LUMBAR TWO-THREE, LUMBAR THREE-FOUR;  Surgeon: Jessy Oto, MD;  Location: [facility];  Service: Orthopedics;  Laterality: N/A;   NASAL SINUS SURGERY     x2   NECK SURGERY     OLECRANON BURSECTOMY  2023   d/t septic bursitis   TRANSTHORACIC ECHOCARDIOGRAM  12/2020   EF normal, mod MR, ascending AA (4.4 cm)   Patient Active Problem List   Diagnosis Date Noted   Cervical dystonia 08/25/2022   Postlaminectomy syndrome, cervical region 08/25/2022   Septic bursitis 04/16/2022   Right bundle branch block 04/15/2022   Orthostasis 04/15/2022   History of aortic stenosis 04/13/2022   Septic bursitis of elbow 04/12/2022   Diabetes mellitus type 2 in nonobese ([place]) 04/16/2021   Lumbar stenosis with neurogenic claudication 01/08/2021   Back pain 01/05/2021   BPH (benign prostatic hyperplasia)    Anxiety and depression    Hypertension associated with diabetes ([place])    Presence of permanent cardiac pacemaker 12/28/2019   Ascending aorta dilation ([place]) 03/06/2019   Cervical myelopathy ([place]) 11/14/2018   Hyperlipidemia associated with type 2 diabetes mellitus ([place]) 10/30/2018   Vitamin D deficiency 10/30/2018   Osteoarthritis 10/30/2018   GERD  (gastroesophageal reflux disease) 10/30/2018   Hypothyroidism 10/30/2018   Essential hypertension 10/30/2018   Mixed dyslipidemia 10/30/2018   Hemangioma of skin and subcutaneous tissue 07/07/2017   Lentigo 07/07/2017   Multiple benign melanocytic nevi 07/07/2017   Complete heart block ([place]) 05/04/2017   S/P AVR (aortic valve replacement) 03/10/2017   Bicuspid aortic valve 12/22/2016   Benign prostatic hyperplasia without lower urinary tract symptoms 11/17/2016   Chronic inflammatory arthritis 11/17/2016   Chronic bilateral low back pain 11/17/2016   Heart murmur 11/17/2016   History of kidney stones 11/17/2016   Anxiety 06/20/2013   Personal history of tobacco use, presenting hazards to health 04/04/2013   Neck pain 04/04/2013   Status post bariatric surgery 04/04/2013   FHx: migraine headaches 04/04/2013   DM2 (diabetes mellitus, type 2) ([place]) 04/03/2013   Chronic pain 04/03/2013   Glaucoma 04/03/2013   Morbid obesity ([place]) 10/27/2011    ONSET DATE: several years   REFERRING DIAG: G24.3 (ICD-10-CM) - Cervical dystonia  THERAPY DIAG:  No diagnosis found.  Rationale for Evaluation and Treatment: Rehabilitation  SUBJECTIVE:  SUBJECTIVE STATEMENT: Wife reports continues to have BP issues.  If I wasn't with him to the car today, he would have fallen.  Felt like legs would give way.  No falls.  Pt accompanied by: wife  PERTINENT HISTORY: Pacemaker, anxiety and depression, bradycardia, cervical myelopathy, charcot's foot, DMII, HTN, migraines, HLD, C3-6, ACDF 2020, L LE fasciotomy 2020, L2-4 laminectomy 2022; pt reports "bad wrist" on R side from cyst that has been removed and has come back  PAIN:  Are you having pain? Yes: NPRS scale: 8/10 Pain location: R shoulders and down the arm and into  shoulder blade, R side of neck as well, down back, having low back pain as well  Pain description: neck and R shoulder throbbing, lower back constant pain/discomfort  Aggravating factors: movement Relieving factors: pain meds, lidoderm patches  PRECAUTIONS: ICD/Pacemaker  WEIGHT BEARING RESTRICTIONS: No  FALLS: Has patient fallen in last 6 months? Yes. Number of falls wife reports multiple falls  LIVING ENVIRONMENT: Lives with: lives with their spouse Lives in: House/apartment; condo Stairs:  1 step to enter Has following equipment at home: Single point cane, Environmental consultant - 2 wheeled, Wheelchair (manual), Electronics engineer, and Grab bars  PLOF: Independent with basic ADLs  PATIENT GOALS: improve neck pain  OBJECTIVE:       TODAY'S TREATMENT: 11/05/22 Activity Comments                       HOME EXERCISE PROGRAM Last updated: 10/15/22 Access Code: [email or web address] URL: [email or web address] Date: 10/15/2022 Prepared by: [facility]  Exercises - Seated Cervical Rotation AROM  - 1 x daily - 5 x weekly - 2 sets - 10 reps - Seated Cervical Extension AROM  - 1 x daily - 5 x weekly - 2 sets - 10 reps - Seated Cervical Sidebending AROM  - 1 x daily - 5 x weekly - 2 sets - 10 reps - Seated Scapular Retraction  - 1 x daily - 5 x weekly - 2 sets - 10 reps - 3 sec hold - Putty Squeezes  - 2 x daily - 7 x weekly - Seated Shoulder Row with Anchored Resistance  - 1 x daily - 5 x weekly - 2 sets - 10 reps - Seated Shoulder Extension and Scapular Retraction with Resistance  - 1 x daily - 5 x weekly - 2 sets - 10 reps - Narrow Stance with Counter Support  - 1 x daily - 5 x weekly - 2-3 sets - 30 sec hold - Standing Toe Taps  - 1 x daily - 5 x weekly - 2 sets - 10 reps - Cervical Extension AROM with Strap  - 1 x daily - 7 x weekly - 3 sets - 10 reps     Below measures were taken at time of initial evaluation unless otherwise  specified:   DIAGNOSTIC FINDINGS: 06/22/22 cervical MRI: Stable postoperative changes from C3-C6 with anterior and interbody fusion hardware.  Stable degenerative subluxation at C6-7 with a bulging uncovered Disc. Small foci of increased T2 signal intensity in the spinal cord at C3-4 suggesting chronic ischemic changes/myelomalacia  COGNITION: Overall cognitive status: Within functional limits for tasks assessed   SENSATION: Diminished sensation in R arm and 5th digit, R thigh and foot  COORDINATION: Alternating pronation/supination: slow B Alternating toe tap: WNL B Finger to nose: WNL B   POSTURE: resting in R cervical sidebending and neck flexion; rounded shoulders and slight  thoracic kyphosis  Resting in 17 deg flexion, 15 deg R sidebending   CERVICAL ROM:   Active ROM A/PROM (deg) eval  Flexion 45 *pain  Extension 4 *pain  Right lateral flexion 28 *pain  Left lateral flexion -10 *pain  Right rotation 37 * pain  Left rotation 40 *pain   (Blank rows = not tested)   CERVICAL MMT:   MMT /5  Flexion 3-  Extension 3-  Right lateral flexion 3-  Left lateral flexion 3-  Right rotation 3-  Left rotation 3-   (Blank rows = not tested)  UPPER EXTREMITY MMT:  MMT Right eval Left eval  Shoulder flexion 4- 4-  Shoulder extension    Shoulder abduction 3+ 4  Shoulder adduction    Shoulder extension    Shoulder internal rotation 3+ 3+  Shoulder external rotation 3+ 3+  Middle trapezius    Lower trapezius    Elbow flexion 3+ 3+  Elbow extension 3- 3-  Wrist flexion 3- 3-  Wrist extension 3- 3-  Wrist ulnar deviation    Wrist radial deviation    Wrist pronation    Wrist supination    Grip strength 10 (avg of 3) 63.3 (avg of 3)   (Blank rows = not tested)    GAIT: Gait pattern:  slow and unsteady; wife assists in getting pt out of chair and guards as he takes off jacket d/t unsteadiness Assistive device utilized: Single point cane Level of assistance:  CGA       GOALS: Goals reviewed with patient? Yes  SHORT TERM GOALS: Target date: 10/27/2022  Patient to be independent with initial HEP. Baseline: HEP initiated Goal status: MET 10/08/22    LONG TERM GOALS: Target date: 11/17/2022  Patient to be independent with advanced HEP. Baseline: Not yet initiated  Goal status: IN PROGRESS  Patient to demonstrate B UE and cervical strength >/=4/5.  Baseline: See above Goal status: IN PROGRESS  Patient to demonstrate resting posture of 7 degrees cervical flexion, 5 degrees R side bending for improved positioning and decreased pain.  Baseline: Resting in 17 deg flexion, 15 deg R sidebending Goal status: IN PROGRESS  Patient to demonstrate cervical AROM WFL and without pain limiting.  Baseline: see above Goal status: IN PROGRESS  Patient to report/demonstrate knowledge of self-management of neck pain at home.  Baseline: Unable Goal status: IN PROGRESS  Patient to score at least 40/56 on [place] in order to decrease risk of falls.  Baseline: NT Goal status: IN PROGRESS   ASSESSMENT:  CLINICAL IMPRESSION: BP WFL throughout session today, but wife reports pt almost fell due to likely decereased BP issue when trying to get into the truck to come to therapy.  He requests review of postural exercises; he performs well with minimal cues and reports he does need assist of wife for resistance bands due to positioning difficulties in a doorway in his home.  He does report some benefit from dry needling last visit.  No c/o upon leaving session today.   OBJECTIVE IMPAIRMENTS: Abnormal gait, decreased activity tolerance, decreased balance, difficulty walking, decreased ROM, decreased strength, dizziness, hypomobility, increased fascial restrictions, increased muscle spasms, impaired flexibility, impaired sensation, impaired UE functional use, improper body mechanics, postural dysfunction, and pain.   ACTIVITY LIMITATIONS: carrying, lifting,  bending, standing, squatting, stairs, transfers, bathing, toileting, dressing, self feeding, reach over head, hygiene/grooming, and locomotion level  PARTICIPATION LIMITATIONS: meal prep, cleaning, laundry, shopping, community activity, and church  PERSONAL FACTORS: Age, Behavior pattern, Past/current  experiences, Time since onset of injury/illness/exacerbation, and 3+ comorbidities: Pacemaker, anxiety and depression, bradycardia, cervical myelopathy, charcot's foot, DMII, HTN, migraines, HLD, C3-6, ACDF 2020, L LE fasciotomy 2020, L2-4 laminectomy 2022  are also affecting patient's functional outcome.   REHAB POTENTIAL: Good  CLINICAL DECISION MAKING: Evolving/moderate complexity  EVALUATION COMPLEXITY: Moderate  PLAN:  PT FREQUENCY: 2x/week  PT DURATION: 6 weeks  PLANNED INTERVENTIONS: Therapeutic exercises, Therapeutic activity, Neuromuscular re-education, Balance training, Gait training, Patient/Family education, Self Care, Joint mobilization, Stair training, Vestibular training, Canalith repositioning, Aquatic Therapy, Dry Needling, Cryotherapy, Moist heat, Taping, Manual therapy, and Re-evaluation  PLAN FOR NEXT SESSION: recheck BP; DN to R UT, LS, and possibly rhomboids and thoracic multifidi ;balance, STM and passive stretching to cervical musculature

## 2022-11-05 ENCOUNTER — Ambulatory Visit: Payer: Medicare PPO | Admitting: Physical Therapy

## 2022-11-08 NOTE — Therapy (Signed)
OUTPATIENT PHYSICAL THERAPY NEURO TREATMENT   Patient Name: Antonio [facility] MRN: [id number] DOB:02/27/1956, 67 y.o., male Today's Date: 11/09/2022   PCP: Cher Nakai, MD  REFERRING PROVIDER: Meredith Staggers, MD  END OF SESSION:  PT End of Session - 11/09/22 1024     Visit Number 9    Number of Visits 13    Date for PT Re-Evaluation 11/17/22    Authorization Type [employer]    Authorization Time Period approved 13 PT visits from 10/06/2022-11/17/2022    Authorization - Visit Number 9    Authorization - Number of Visits 13    PT Start Time 0934    PT Stop Time 1019    PT Time Calculation (min) 45 min    Equipment Utilized During Treatment Gait belt    Activity Tolerance Patient tolerated treatment well    Behavior During Therapy [facility] for tasks assessed/performed                   Past Medical History:  Diagnosis Date   Anxiety and depression    Ascending aorta dilation ([place]) 03/06/2019   4.3 cm 2022   Benign prostatic hyperplasia without lower urinary tract symptoms 11/17/2016   Last Assessment & Plan:  Stable PSA today.  Formatting of this note might be different from the original. Last Assessment & Plan:  Stable PSA today. Last Assessment & Plan:  Formatting of this note might be different from the original. Stable PSA today.   BPH (benign prostatic hyperplasia)    Bradycardia 02/01/2019   Cervical myelopathy ([place]) 11/14/2018   Charcot's joint of foot 03/05/2019   Chronic back pain 04/03/2013   Chronic inflammatory arthritis 11/17/2016   History of positive rheumatoid factor in the past. Denies placement on immunosuppressive therapy   Chronic pain syndrome    Failed back surgical syndrome.  Chronic neck pain.  Chronic low back pain with sciatica bilateral   Colon cancer screening    2023 cologuard neg   Complete heart block (HCC)    Following AVR s/p [employer] Jude PPM in 03/2017   DM2 (diabetes mellitus, type 2) ([place]) 04/03/2013   Last Assessment & Plan:   Formatting of this note might be different from the original. Diabetes is unchanged.  Continue current treatment regimen. Regular aerobic exercise. Diabetes will be reassessed in 3 months. Will check A1c today. Denies any problems with feet or sensation.   Elevated PSA 11/2021   Enthesopathy of ankle and tarsus 04/04/2013   Last Assessment & Plan:  Continues with swelling after prolonged standing.  Still  Wearing support hose, which helps.   Essential hypertension 10/30/2018   FHx: migraine headaches 04/04/2013   Fracture of capitate bone of wrist 02/26/2019   Fracture of tibia 03/05/2019   GERD (gastroesophageal reflux disease) 10/30/2018   Glaucoma 04/03/2013   Hemangioma of skin and subcutaneous tissue 07/07/2017   History of aortic valve replacement 2018   bicuspid AV   History of kidney stones 11/17/2016   History of sleep apnea    wt loss->gone   Hypothyroidism 10/30/2018   IDA (iron deficiency anemia) 11/2021   malabs d/t gastric bipass   Lesion of plantar nerve 04/04/2013   Lumbar stenosis with neurogenic claudication 01/08/2021   Major depressive disorder, recurrent ([place])    ECT in the remote past.  Patient disabled due to recurrent depression and chronic pain.   Migraine syndrome    Mixed dyslipidemia 10/30/2018   Morbid obesity ([place]) 10/27/2011   Nondisplaced  fracture of medial cuneiform of left foot, initial encounter for closed fracture 11/16/2018   Osteoarthritis 10/30/2018   Personal history of tobacco use, presenting hazards to health 04/04/2013   Presence of permanent cardiac pacemaker    Severe single current episode of major depressive disorder, without psychotic features ([place]) 11/17/2016   Status post bariatric surgery 04/05/2011   bipass surg --presurg wt 310 lbs   Syncope 02/14/2019   Vitamin D deficiency 10/30/2018   Past Surgical History:  Procedure Laterality Date   ANTERIOR CERVICAL DECOMP/DISCECTOMY FUSION N/A 11/17/2018   Procedure: CERVICAL  THREE-CERVICAL FOUR, CERVICAL FOUR-CERVICAL FIVE, CERVICAL FIVE-CERVICAL SIX ANTERIOR CERVICAL DECOMPRESSION/DISCECTOMY FUSION;  Surgeon: Earnie Larsson, MD;  Location: [facility];  Service: Neurosurgery;  Laterality: N/A;   AORTIC VALVE REPLACEMENT  [id number]   APPLICATION OF WOUND VAC Left 04/05/2019   Procedure: Application Of Wound Vac;  Surgeon: Erle Crocker, MD;  Location: [facility];  Service: Orthopedics;  Laterality: Left;   BACK SURGERY     x4   CARDIOVASCULAR STRESS TEST  05/05/2021   lexiscan neg   FASCIOTOMY Left 04/05/2019   Left leg 2 compartment fasciotomy   FASCIOTOMY Left 04/05/2019   Procedure: FASCIOTOMY LEFT LOWER LEG;  Surgeon: Erle Crocker, MD;  Location: [facility];  Service: Orthopedics;  Laterality: Left;   FOOT NEUROMA SURGERY     GASTRIC BYPASS  2012   HEMATOMA EVACUATION Left 04/05/2019   Procedure: Evacuation Hematoma Left Lower Leg;  Surgeon: Erle Crocker, MD;  Location: [facility];  Service: Orthopedics;  Laterality: Left;   HEMORRHOID SURGERY     over 30 years ago   [place] umb   I & D EXTREMITY Left 04/13/2022   Procedure: IRRIGATION AND DEBRIDEMENT OF ELBOW AND BURSECTOMY;  Surgeon: Iran Planas, MD;  Location: [facility];  Service: Orthopedics;  Laterality: Left;   LUMBAR LAMINECTOMY/DECOMPRESSION MICRODISCECTOMY N/A 01/08/2021   Procedure: CENTRAL LUMBAR LAMINECTOMY LUMBAR TWO-THREE, LUMBAR THREE-FOUR;  Surgeon: Jessy Oto, MD;  Location: [facility];  Service: Orthopedics;  Laterality: N/A;   NASAL SINUS SURGERY     x2   NECK SURGERY     OLECRANON BURSECTOMY  2023   d/t septic bursitis   TRANSTHORACIC ECHOCARDIOGRAM  12/2020   EF normal, mod MR, ascending AA (4.4 cm)   Patient Active Problem List   Diagnosis Date Noted   Cervical dystonia 08/25/2022   Postlaminectomy syndrome, cervical region 08/25/2022   Septic bursitis 04/16/2022   Right bundle branch block 04/15/2022   Orthostasis 04/15/2022   History of aortic stenosis 04/13/2022    Septic bursitis of elbow 04/12/2022   Diabetes mellitus type 2 in nonobese ([place]) 04/16/2021   Lumbar stenosis with neurogenic claudication 01/08/2021   Back pain 01/05/2021   BPH (benign prostatic hyperplasia)    Anxiety and depression    Hypertension associated with diabetes ([place])    Presence of permanent cardiac pacemaker 12/28/2019   Ascending aorta dilation ([place]) 03/06/2019   Cervical myelopathy ([place]) 11/14/2018   Hyperlipidemia associated with type 2 diabetes mellitus ([place]) 10/30/2018   Vitamin D deficiency 10/30/2018   Osteoarthritis 10/30/2018   GERD (gastroesophageal reflux disease) 10/30/2018   Hypothyroidism 10/30/2018   Essential hypertension 10/30/2018   Mixed dyslipidemia 10/30/2018   Hemangioma of skin and subcutaneous tissue 07/07/2017   Lentigo 07/07/2017   Multiple benign melanocytic nevi 07/07/2017   Complete heart block ([place]) 05/04/2017   S/P AVR (aortic valve replacement) 03/10/2017   Bicuspid aortic valve 12/22/2016  Benign prostatic hyperplasia without lower urinary tract symptoms 11/17/2016   Chronic inflammatory arthritis 11/17/2016   Chronic bilateral low back pain 11/17/2016   Heart murmur 11/17/2016   History of kidney stones 11/17/2016   Anxiety 06/20/2013   Personal history of tobacco use, presenting hazards to health 04/04/2013   Neck pain 04/04/2013   Status post bariatric surgery 04/04/2013   FHx: migraine headaches 04/04/2013   DM2 (diabetes mellitus, type 2) ([place]) 04/03/2013   Chronic pain 04/03/2013   Glaucoma 04/03/2013   Morbid obesity ([place]) 10/27/2011    ONSET DATE: several years   REFERRING DIAG: G24.3 (ICD-10-CM) - Cervical dystonia  THERAPY DIAG:  Abnormal posture  Cervicalgia  Muscle weakness (generalized)  Dizziness and giddiness  Unsteadiness on feet  Rationale for Evaluation and Treatment: Rehabilitation  SUBJECTIVE:                                                                                                                                                                                              SUBJECTIVE STATEMENT:  Had to cancel last week d/t lack of strength and balance. Also having a few balance problems today.   Pt accompanied by: wife  PERTINENT HISTORY: Pacemaker, anxiety and depression, bradycardia, cervical myelopathy, charcot's foot, DMII, HTN, migraines, HLD, C3-6, ACDF 2020, L LE fasciotomy 2020, L2-4 laminectomy 2022; pt reports "bad wrist" on R side from cyst that has been removed and has come back  PAIN:  Are you having pain? Yes: NPRS scale: 8/10 Pain location: R shoulders and down the arm and into shoulder blade, R side of neck as well, down back, having low back pain as well  Pain description: neck and R shoulder throbbing, lower back constant pain/discomfort  Aggravating factors: movement Relieving factors: pain meds, lidoderm patches  PRECAUTIONS: ICD/Pacemaker  WEIGHT BEARING RESTRICTIONS: No  FALLS: Has patient fallen in last 6 months? Yes. Number of falls wife reports multiple falls  LIVING ENVIRONMENT: Lives with: lives with their spouse Lives in: House/apartment; condo Stairs:  1 step to enter Has following equipment at home: Single point cane, Environmental consultant - 2 wheeled, Wheelchair (manual), Electronics engineer, and Grab bars  PLOF: Independent with basic ADLs  PATIENT GOALS: improve neck pain  OBJECTIVE:       TODAY'S TREATMENT: 11/09/22 Activity Comments  Vitals at start of session  117/84 mmHg, 60 BPM  open book stretch 10x each Heavy manual cueing for proper alignment and avoiding compensations   Passive L cervical sidebending PROM, cervical L translation jt mobs grade II Tolerated well; c/o lightheadedness upon sitting up from mat- vitals 119/84 mmHg, 60 bpm  Marching and ankle pumps before standing up  Pt did not report lightheadedness  standing R/L and ant/pos wt shifts Able to perform without UE support; cueing to extend knees  alt toe tap on step 2x10  With/without UE support; cueing to correct crouched posture   Stair navigation with spc and 1 handrail x4 Imbalance descending; edu on proper SPC sequencing    Step ups with 1 handrail x5 Pt paused during exercise and reported lightheadedness; vitals 106/75 mmHg, 60 bpm    Walked patient out to car for safety and practiced curb navigation with SPC and 1 HHA Some imbalance on curb    PATIENT EDUCATION: Education details: edu on hydrating with water or electrolyte beverage rather than tea Person educated: Patient and Spouse Education method: Explanation Education comprehension: verbalized understanding    HOME EXERCISE PROGRAM Last updated: 10/15/22 Access Code: [email or web address] URL: [email or web address] Date: 10/15/2022 Prepared by: [facility]  Exercises - Seated Cervical Rotation AROM  - 1 x daily - 5 x weekly - 2 sets - 10 reps - Seated Cervical Extension AROM  - 1 x daily - 5 x weekly - 2 sets - 10 reps - Seated Cervical Sidebending AROM  - 1 x daily - 5 x weekly - 2 sets - 10 reps - Seated Scapular Retraction  - 1 x daily - 5 x weekly - 2 sets - 10 reps - 3 sec hold - Putty Squeezes  - 2 x daily - 7 x weekly - Seated Shoulder Row with Anchored Resistance  - 1 x daily - 5 x weekly - 2 sets - 10 reps - Seated Shoulder Extension and Scapular Retraction with Resistance  - 1 x daily - 5 x weekly - 2 sets - 10 reps - Narrow Stance with Counter Support  - 1 x daily - 5 x weekly - 2-3 sets - 30 sec hold - Standing Toe Taps  - 1 x daily - 5 x weekly - 2 sets - 10 reps - Cervical Extension AROM with Strap  - 1 x daily - 7 x weekly - 3 sets - 10 reps     Below measures were taken at time of initial evaluation unless otherwise specified:   DIAGNOSTIC FINDINGS: 06/22/22 cervical MRI: Stable postoperative changes from C3-C6 with anterior and interbody fusion hardware.  Stable degenerative subluxation at C6-7 with a bulging uncovered Disc. Small  foci of increased T2 signal intensity in the spinal cord at C3-4 suggesting chronic ischemic changes/myelomalacia  COGNITION: Overall cognitive status: Within functional limits for tasks assessed   SENSATION: Diminished sensation in R arm and 5th digit, R thigh and foot  COORDINATION: Alternating pronation/supination: slow B Alternating toe tap: WNL B Finger to nose: WNL B   POSTURE: resting in R cervical sidebending and neck flexion; rounded shoulders and slight thoracic kyphosis  Resting in 17 deg flexion, 15 deg R sidebending   CERVICAL ROM:   Active ROM A/PROM (deg) eval  Flexion 45 *pain  Extension 4 *pain  Right lateral flexion 28 *pain  Left lateral flexion -10 *pain  Right rotation 37 * pain  Left rotation 40 *pain   (Blank rows = not tested)   CERVICAL MMT:   MMT /5  Flexion 3-  Extension 3-  Right lateral flexion 3-  Left lateral flexion 3-  Right rotation 3-  Left rotation 3-   (Blank rows = not tested)  UPPER EXTREMITY MMT:  MMT Right eval Left eval  Shoulder flexion 4- 4-  Shoulder extension    Shoulder abduction 3+ 4  Shoulder adduction    Shoulder extension    Shoulder internal rotation 3+ 3+  Shoulder external rotation 3+ 3+  Middle trapezius    Lower trapezius    Elbow flexion 3+ 3+  Elbow extension 3- 3-  Wrist flexion 3- 3-  Wrist extension 3- 3-  Wrist ulnar deviation    Wrist radial deviation    Wrist pronation    Wrist supination    Grip strength 10 (avg of 3) 63.3 (avg of 3)   (Blank rows = not tested)    GAIT: Gait pattern:  slow and unsteady; wife assists in getting pt out of chair and guards as he takes off jacket d/t unsteadiness Assistive device utilized: Single point cane Level of assistance: CGA       GOALS: Goals reviewed with patient? Yes  SHORT TERM GOALS: Target date: 10/27/2022  Patient to be independent with initial HEP. Baseline: HEP initiated Goal status: MET 10/08/22    LONG TERM GOALS: Target  date: 11/17/2022  Patient to be independent with advanced HEP. Baseline: Not yet initiated  Goal status: IN PROGRESS  Patient to demonstrate B UE and cervical strength >/=4/5.  Baseline: See above Goal status: IN PROGRESS  Patient to demonstrate resting posture of 7 degrees cervical flexion, 5 degrees R side bending for improved positioning and decreased pain.  Baseline: Resting in 17 deg flexion, 15 deg R sidebending Goal status: IN PROGRESS  Patient to demonstrate cervical AROM WFL and without pain limiting.  Baseline: see above Goal status: IN PROGRESS  Patient to report/demonstrate knowledge of self-management of neck pain at home.  Baseline: Unable Goal status: IN PROGRESS  Patient to score at least 40/56 on [place] in order to decrease risk of falls.  Baseline: NT Goal status: IN PROGRESS   ASSESSMENT:  CLINICAL IMPRESSION: Patient arrived to session with looking pale and requiring wife's assist for transfers and ambulation. Notes trouble with balance this AM. Vitals normal and patient's complexion improved upon sitting down. Patient tolerated active and passive stretching activities well. Worked on standing balance activities with focus on weight shifting, SLS, and stairs. Imbalance evident when descending stairs, even with use of SPC and handrail. Patient noted lightheadedness again during step ups which were discontinued- vitals revealed a drop in BP. Educated patient and wife on proper hydration and walked patient out of clinic for safety as fluctuating BP is an ongoing issue and MD's are well aware.     OBJECTIVE IMPAIRMENTS: Abnormal gait, decreased activity tolerance, decreased balance, difficulty walking, decreased ROM, decreased strength, dizziness, hypomobility, increased fascial restrictions, increased muscle spasms, impaired flexibility, impaired sensation, impaired UE functional use, improper body mechanics, postural dysfunction, and pain.   ACTIVITY LIMITATIONS:  carrying, lifting, bending, standing, squatting, stairs, transfers, bathing, toileting, dressing, self feeding, reach over head, hygiene/grooming, and locomotion level  PARTICIPATION LIMITATIONS: meal prep, cleaning, laundry, shopping, community activity, and church  PERSONAL FACTORS: Age, Behavior pattern, Past/current experiences, Time since onset of injury/illness/exacerbation, and 3+ comorbidities: Pacemaker, anxiety and depression, bradycardia, cervical myelopathy, charcot's foot, DMII, HTN, migraines, HLD, C3-6, ACDF 2020, L LE fasciotomy 2020, L2-4 laminectomy 2022  are also affecting patient's functional outcome.   REHAB POTENTIAL: Good  CLINICAL DECISION MAKING: Evolving/moderate complexity  EVALUATION COMPLEXITY: Moderate  PLAN:  PT FREQUENCY: 2x/week  PT DURATION: 6 weeks  PLANNED INTERVENTIONS: Therapeutic exercises, Therapeutic activity, Neuromuscular re-education, Balance training, Gait training, Patient/Family education, Self Care, Joint  mobilization, Stair training, Vestibular training, Canalith repositioning, Aquatic Therapy, Dry Needling, Cryotherapy, Moist heat, Taping, Manual therapy, and Re-evaluation  PLAN FOR NEXT SESSION: recheck BP; DN to R UT, LS, and possibly rhomboids and thoracic multifidi ;balance, STM and passive stretching to cervical musculature    Janene Harvey, PT, DPT 11/09/22 10:28 AM  [facility] [address], [place], [place] [address] Phone # [phone] Fax # [phone]

## 2022-11-09 ENCOUNTER — Ambulatory Visit: Payer: Medicare PPO | Attending: Physical Medicine & Rehabilitation | Admitting: Physical Therapy

## 2022-11-09 ENCOUNTER — Encounter: Payer: Self-pay | Admitting: Physical Therapy

## 2022-11-09 DIAGNOSIS — R2681 Unsteadiness on feet: Secondary | ICD-10-CM | POA: Insufficient documentation

## 2022-11-09 DIAGNOSIS — R293 Abnormal posture: Secondary | ICD-10-CM | POA: Insufficient documentation

## 2022-11-09 DIAGNOSIS — R42 Dizziness and giddiness: Secondary | ICD-10-CM | POA: Insufficient documentation

## 2022-11-09 DIAGNOSIS — M542 Cervicalgia: Secondary | ICD-10-CM | POA: Diagnosis present

## 2022-11-09 DIAGNOSIS — M6281 Muscle weakness (generalized): Secondary | ICD-10-CM | POA: Insufficient documentation

## 2022-11-11 NOTE — Therapy (Addendum)
OUTPATIENT PHYSICAL THERAPY NEURO PROGRESS NOTE/RE-CERT   Patient Name: Antonio Oliver MRN: [id number] DOB:[birth year], 67 y.o., male Today's Date: 11/12/2022   PCP: Cher Nakai, MD  REFERRING PROVIDER: Meredith Staggers, MD  Progress Note Reporting Period 10/06/22 to 11/12/22  See note below for Objective Data and Assessment of Progress/Goals.     END OF SESSION:  PT End of Session - 11/12/22 1015     Visit Number 10    Number of Visits 22    Date for PT Re-Evaluation 01/21/23    Authorization Type [employer]    Authorization Time Period approved 13 PT visits from 10/06/2022-11/17/2022    Authorization - Visit Number 10    Authorization - Number of Visits 13    PT Start Time 0931    PT Stop Time 1015    PT Time Calculation (min) 44 min    Activity Tolerance Patient tolerated treatment well    Behavior During Therapy [facility] for tasks assessed/performed                   Past Medical History:  Diagnosis Date   Anxiety and depression    Ascending aorta dilation ([place]) 03/06/2019   4.3 cm 2022   Benign prostatic hyperplasia without lower urinary tract symptoms 11/17/2016   Last Assessment & Plan:  Stable PSA today.  Formatting of this note might be different from the original. Last Assessment & Plan:  Stable PSA today. Last Assessment & Plan:  Formatting of this note might be different from the original. Stable PSA today.   BPH (benign prostatic hyperplasia)    Bradycardia 02/01/2019   Cervical myelopathy ([place]) 11/14/2018   Charcot's joint of foot 03/05/2019   Chronic back pain 04/03/2013   Chronic inflammatory arthritis 11/17/2016   History of positive rheumatoid factor in the past. Denies placement on immunosuppressive therapy   Chronic pain syndrome    Failed back surgical syndrome.  Chronic neck pain.  Chronic low back pain with sciatica bilateral   Colon cancer screening    2023 cologuard neg   Complete heart block (HCC)    Following AVR s/p [employer] PPM  in 03/2017   DM2 (diabetes mellitus, type 2) ([place]) 04/03/2013   Last Assessment & Plan:  Formatting of this note might be different from the original. Diabetes is unchanged.  Continue current treatment regimen. Regular aerobic exercise. Diabetes will be reassessed in 3 months. Will check A1c today. Denies any problems with feet or sensation.   Elevated PSA 11/2021   Enthesopathy of ankle and tarsus 04/04/2013   Last Assessment & Plan:  Continues with swelling after prolonged standing.  Still  Wearing support hose, which helps.   Essential hypertension 10/30/2018   FHx: migraine headaches 04/04/2013   Fracture of capitate bone of wrist 02/26/2019   Fracture of tibia 03/05/2019   GERD (gastroesophageal reflux disease) 10/30/2018   Glaucoma 04/03/2013   Hemangioma of skin and subcutaneous tissue 07/07/2017   History of aortic valve replacement 2018   bicuspid AV   History of kidney stones 11/17/2016   History of sleep apnea    wt loss->gone   Hypothyroidism 10/30/2018   IDA (iron deficiency anemia) 11/2021   malabs d/t gastric bipass   Lesion of plantar nerve 04/04/2013   Lumbar stenosis with neurogenic claudication 01/08/2021   Major depressive disorder, recurrent ([place])    ECT in the remote past.  Patient disabled due to recurrent depression and chronic pain.   Migraine syndrome  Mixed dyslipidemia 10/30/2018   Morbid obesity ([place]) 10/27/2011   Nondisplaced fracture of medial cuneiform of left foot, initial encounter for closed fracture 11/16/2018   Osteoarthritis 10/30/2018   Personal history of tobacco use, presenting hazards to health 04/04/2013   Presence of permanent cardiac pacemaker    Severe single current episode of major depressive disorder, without psychotic features ([place]) 11/17/2016   Status post bariatric surgery 04/05/2011   bipass surg --presurg wt 310 lbs   Syncope 02/14/2019   Vitamin D deficiency 10/30/2018   Past Surgical History:  Procedure Laterality Date    ANTERIOR CERVICAL DECOMP/DISCECTOMY FUSION N/A 11/17/2018   Procedure: CERVICAL THREE-CERVICAL FOUR, CERVICAL FOUR-CERVICAL FIVE, CERVICAL FIVE-CERVICAL SIX ANTERIOR CERVICAL DECOMPRESSION/DISCECTOMY FUSION;  Surgeon: Earnie Larsson, MD;  Location: [place];  Service: Neurosurgery;  Laterality: N/A;   AORTIC VALVE REPLACEMENT  [id number]   APPLICATION OF WOUND VAC Left 04/05/2019   Procedure: Application Of Wound Vac;  Surgeon: Erle Crocker, MD;  Location: [place];  Service: Orthopedics;  Laterality: Left;   BACK SURGERY     x4   CARDIOVASCULAR STRESS TEST  05/05/2021   lexiscan neg   FASCIOTOMY Left 04/05/2019   Left leg 2 compartment fasciotomy   FASCIOTOMY Left 04/05/2019   Procedure: FASCIOTOMY LEFT LOWER LEG;  Surgeon: Erle Crocker, MD;  Location: [place];  Service: Orthopedics;  Laterality: Left;   FOOT NEUROMA SURGERY     GASTRIC BYPASS  2012   HEMATOMA EVACUATION Left 04/05/2019   Procedure: Evacuation Hematoma Left Lower Leg;  Surgeon: Erle Crocker, MD;  Location: [place];  Service: Orthopedics;  Laterality: Left;   HEMORRHOID SURGERY     over 30 years ago   [place] umb   I & D EXTREMITY Left 04/13/2022   Procedure: IRRIGATION AND DEBRIDEMENT OF ELBOW AND BURSECTOMY;  Surgeon: Iran Planas, MD;  Location: [place];  Service: Orthopedics;  Laterality: Left;   LUMBAR LAMINECTOMY/DECOMPRESSION MICRODISCECTOMY N/A 01/08/2021   Procedure: CENTRAL LUMBAR LAMINECTOMY LUMBAR TWO-THREE, LUMBAR THREE-FOUR;  Surgeon: Jessy Oto, MD;  Location: [place];  Service: Orthopedics;  Laterality: N/A;   NASAL SINUS SURGERY     x2   NECK SURGERY     OLECRANON BURSECTOMY  2023   d/t septic bursitis   TRANSTHORACIC ECHOCARDIOGRAM  12/2020   EF normal, mod MR, ascending AA (4.4 cm)   Patient Active Problem List   Diagnosis Date Noted   Cervical dystonia 08/25/2022   Postlaminectomy syndrome, cervical region 08/25/2022   Septic bursitis 04/16/2022   Right bundle branch  block 04/15/2022   Orthostasis 04/15/2022   History of aortic stenosis 04/13/2022   Septic bursitis of elbow 04/12/2022   Diabetes mellitus type 2 in nonobese ([place]) 04/16/2021   Lumbar stenosis with neurogenic claudication 01/08/2021   Back pain 01/05/2021   BPH (benign prostatic hyperplasia)    Anxiety and depression    Hypertension associated with diabetes ([place])    Presence of permanent cardiac pacemaker 12/28/2019   Ascending aorta dilation ([place]) 03/06/2019   Cervical myelopathy ([place]) 11/14/2018   Hyperlipidemia associated with type 2 diabetes mellitus ([place]) 10/30/2018   Vitamin D deficiency 10/30/2018   Osteoarthritis 10/30/2018   GERD (gastroesophageal reflux disease) 10/30/2018   Hypothyroidism 10/30/2018   Essential hypertension 10/30/2018   Mixed dyslipidemia 10/30/2018   Hemangioma of skin and subcutaneous tissue 07/07/2017   Lentigo 07/07/2017   Multiple benign melanocytic nevi 07/07/2017   Complete heart block ([place]) 05/04/2017  S/P AVR (aortic valve replacement) 03/10/2017   Bicuspid aortic valve 12/22/2016   Benign prostatic hyperplasia without lower urinary tract symptoms 11/17/2016   Chronic inflammatory arthritis 11/17/2016   Chronic bilateral low back pain 11/17/2016   Heart murmur 11/17/2016   History of kidney stones 11/17/2016   Anxiety 06/20/2013   Personal history of tobacco use, presenting hazards to health 04/04/2013   Neck pain 04/04/2013   Status post bariatric surgery 04/04/2013   FHx: migraine headaches 04/04/2013   DM2 (diabetes mellitus, type 2) ([place]) 04/03/2013   Chronic pain 04/03/2013   Glaucoma 04/03/2013   Morbid obesity ([place]) 10/27/2011    ONSET DATE: several years   REFERRING DIAG: G24.3 (ICD-10-CM) - Cervical dystonia  THERAPY DIAG:  Abnormal posture  Cervicalgia  Muscle weakness (generalized)  Dizziness and giddiness  Unsteadiness on feet  Rationale for Evaluation and Treatment: Rehabilitation  SUBJECTIVE:                                                                                                                                                                                              SUBJECTIVE STATEMENT: Wife reports that BP was checked in the L arm this AM and was 160/138, then checked on R arm with diastolic in the 0000000. Wife reports "something weird with the L arm." Notes that he took hydralazine after checking BP. Pt reports feeling foggy and reports he didn't sleep well last night d/t pain and worrying about his BP.   Pt accompanied by: wife  PERTINENT HISTORY: Pacemaker, anxiety and depression, bradycardia, cervical myelopathy, charcot's foot, DMII, HTN, migraines, HLD, C3-6, ACDF 2020, L LE fasciotomy 2020, L2-4 laminectomy 2022; pt reports "bad wrist" on R side from cyst that has been removed and has come back  PAIN:  Are you having pain? Yes: NPRS scale: 8/10 Pain location: R shoulders and down the arm and into shoulder blade, R side of neck as well, down back, having low back pain as well  Pain description: neck and R shoulder throbbing, lower back constant pain/discomfort  Aggravating factors: movement Relieving factors: pain meds, lidoderm patches  PRECAUTIONS: ICD/Pacemaker  WEIGHT BEARING RESTRICTIONS: No  FALLS: Has patient fallen in last 6 months? Yes. Number of falls wife reports multiple falls  LIVING ENVIRONMENT: Lives with: lives with their spouse Lives in: House/apartment; condo Stairs:  1 step to enter Has following equipment at home: Single point cane, Environmental consultant - 2 wheeled, Wheelchair (manual), Electronics engineer, and Grab bars  PLOF: Independent with basic ADLs  PATIENT GOALS: improve neck pain  OBJECTIVE:     TODAY'S TREATMENT: 11/12/22 Activity Comments  Vitals at  start of session L UE 90/68 mmHg, 64 bpm, 83-98% spO2 *spO2 improved with deep breaths     Resting posture Resting in 10 deg flexion, 18 deg R sidebending  Vitals mid-session 86/66 mmHg, 67 bpm, 84-91% spO2  *spO2 improved with deep breaths     CERVICAL ROM:   Active ROM A/PROM (deg) eval AROM (deg) 11/12/22  Flexion 45 *pain 45  Extension 4 *pain 12  Right lateral flexion 28 *pain 25  Left lateral flexion -10 *pain -10  Right rotation 37 * pain 39  Left rotation 40 *pain 39   (Blank rows = not tested)   CERVICAL MMT:   MMT eval /5 11/12/22 /5  Flexion 3- 3+  Extension 3- 4  Right lateral flexion 3- 3+  Left lateral flexion 3- 3+  Right rotation 3- 3+  Left rotation 3- 3+   (Blank rows = not tested)  UPPER EXTREMITY MMT:  MMT Right eval Left eval Right 11/12/22 Left 11/12/22  Shoulder flexion 4- 4- 4 4-  Shoulder extension      Shoulder abduction 3+ 4 4 4-  Shoulder adduction      Shoulder extension      Shoulder internal rotation 3+ 3+ 3+ 4-  Shoulder external rotation 3+ 3+ 3+ 4-  Middle trapezius      Lower trapezius      Elbow flexion 3+ 3+ 4 4  Elbow extension 3- 3- 4- 4-  Wrist flexion 3- 3- 4 4-  Wrist extension 3- 3- 4 4-  Wrist ulnar deviation      Wrist radial deviation      Wrist pronation      Wrist supination      Grip strength 10 (avg of 3) 63.3 (avg of 3) 16.6 (avg of 3) 61 (avg of 3)   (Blank rows = not tested)     PATIENT EDUCATION: Education details: edu on exam findings and remaining impairments; Spoke with patient and wife about dropping SpO2 values and upcoming dysport injection in April. Plan to make PCP aware of O2 values and wait until after next injection to proceed with cervical stretching/PT to address remaining goals Person educated: Patient and Spouse Education method: Explanation, Demonstration, Tactile cues, and Verbal cues Education comprehension: verbalized understanding    HOME EXERCISE PROGRAM Last updated: 10/15/22 Access Code: [email or web address] URL: [email or web address] Date: 10/15/2022 Prepared by: [facility]  Exercises - Seated Cervical Rotation AROM  - 1 x daily - 5 x  weekly - 2 sets - 10 reps - Seated Cervical Extension AROM  - 1 x daily - 5 x weekly - 2 sets - 10 reps - Seated Cervical Sidebending AROM  - 1 x daily - 5 x weekly - 2 sets - 10 reps - Seated Scapular Retraction  - 1 x daily - 5 x weekly - 2 sets - 10 reps - 3 sec hold - Putty Squeezes  - 2 x daily - 7 x weekly - Seated Shoulder Row with Anchored Resistance  - 1 x daily - 5 x weekly - 2 sets - 10 reps - Seated Shoulder Extension and Scapular Retraction with Resistance  - 1 x daily - 5 x weekly - 2 sets - 10 reps - Narrow Stance with Counter Support  - 1 x daily - 5 x weekly - 2-3 sets - 30 sec hold - Standing Toe Taps  - 1 x daily - 5 x weekly - 2 sets - 10 reps -  Cervical Extension AROM with Strap  - 1 x daily - 7 x weekly - 3 sets - 10 reps     Below measures were taken at time of initial evaluation unless otherwise specified:   DIAGNOSTIC FINDINGS: 06/22/22 cervical MRI: Stable postoperative changes from C3-C6 with anterior and interbody fusion hardware.  Stable degenerative subluxation at C6-7 with a bulging uncovered Disc. Small foci of increased T2 signal intensity in the spinal cord at C3-4 suggesting chronic ischemic changes/myelomalacia  COGNITION: Overall cognitive status: Within functional limits for tasks assessed   SENSATION: Diminished sensation in R arm and 5th digit, R thigh and foot  COORDINATION: Alternating pronation/supination: slow B Alternating toe tap: WNL B Finger to nose: WNL B   POSTURE: resting in R cervical sidebending and neck flexion; rounded shoulders and slight thoracic kyphosis  Resting in 17 deg flexion, 15 deg R sidebending   CERVICAL ROM:   Active ROM A/PROM (deg) eval  Flexion 45 *pain  Extension 4 *pain  Right lateral flexion 28 *pain  Left lateral flexion -10 *pain  Right rotation 37 * pain  Left rotation 40 *pain   (Blank rows = not tested)   CERVICAL MMT:   MMT /5  Flexion 3-  Extension 3-  Right lateral flexion 3-  Left  lateral flexion 3-  Right rotation 3-  Left rotation 3-   (Blank rows = not tested)  UPPER EXTREMITY MMT:  MMT Right eval Left eval  Shoulder flexion 4- 4-  Shoulder extension    Shoulder abduction 3+ 4  Shoulder adduction    Shoulder extension    Shoulder internal rotation 3+ 3+  Shoulder external rotation 3+ 3+  Middle trapezius    Lower trapezius    Elbow flexion 3+ 3+  Elbow extension 3- 3-  Wrist flexion 3- 3-  Wrist extension 3- 3-  Wrist ulnar deviation    Wrist radial deviation    Wrist pronation    Wrist supination    Grip strength 10 (avg of 3) 63.3 (avg of 3)   (Blank rows = not tested)    GAIT: Gait pattern:  slow and unsteady; wife assists in getting pt out of chair and guards as he takes off jacket d/t unsteadiness Assistive device utilized: Single point cane Level of assistance: CGA       GOALS: Goals reviewed with patient? Yes  SHORT TERM GOALS: Target date: 10/27/2022  Patient to be independent with initial HEP. Baseline: HEP initiated Goal status: MET 10/08/22    LONG TERM GOALS: Target date: 01/21/2023  Patient to be independent with advanced HEP. Baseline: Not yet initiated; denies questions 11/12/22 Goal status: IN PROGRESS 11/12/22  Patient to demonstrate B UE and cervical strength >/=4/5.  Baseline: See above; improving- see above  11/12/22 Goal status: IN PROGRESS  11/12/22  Patient to demonstrate resting posture of 7 degrees cervical flexion, 5 degrees R side bending for improved positioning and decreased pain.  Baseline: Resting in 17 deg flexion, 15 deg R sidebending; Resting in 10 deg flexion, 18 deg R sidebending 11/12/22 Goal status: IN PROGRESS 11/12/22  Patient to demonstrate cervical AROM WFL and without pain limiting.  Baseline: see above; improved in extension but no increase in pain 11/12/22 Goal status: IN PROGRESS 11/12/22  Patient to report/demonstrate knowledge of self-management of neck pain at home.  Baseline: Unable;  reports using medication, heat, ice. Goal status: IN PROGRESS 11/12/22  Patient to score at least 40/56 on [place] in order to decrease risk of  falls.  Baseline: 37 10/15/22; NT  11/12/22 Goal status: IN PROGRESS  11/12/22   ASSESSMENT:  CLINICAL IMPRESSION: Patient arrived to session with wife who reported that patient's BP was significantly elevated this AM on the L arm but was closer to normal in the R arm; wife reports administering Hydralazine. Vitals at start of session were WNL with exception of spO2 which ranged from 83-98%. Strength testing revealed improved cervical strength, R shoulder flexion and abduction, L shoulder IR/ER, B elbow, wrist, and R grip strength. Patient's resting head posture has improved in flexion; still demonstrating considerable R SBing. Cervical AROM has improved in extension and no longer increases pain levels. Patient reports that he uses the following for pain management at home: medication, heat, ice. Spoke with patient and wife about dropping SpO2 values and upcoming dysport injection in April. Plan to make PCP aware of O2 values and wait until after next injection to proceed with PT to address remaining goals. 30 day hold at this time.      OBJECTIVE IMPAIRMENTS: Abnormal gait, decreased activity tolerance, decreased balance, difficulty walking, decreased ROM, decreased strength, dizziness, hypomobility, increased fascial restrictions, increased muscle spasms, impaired flexibility, impaired sensation, impaired UE functional use, improper body mechanics, postural dysfunction, and pain.   ACTIVITY LIMITATIONS: carrying, lifting, bending, standing, squatting, stairs, transfers, bathing, toileting, dressing, self feeding, reach over head, hygiene/grooming, and locomotion level  PARTICIPATION LIMITATIONS: meal prep, cleaning, laundry, shopping, community activity, and church  PERSONAL FACTORS: Age, Behavior pattern, Past/current experiences, Time since onset of  injury/illness/exacerbation, and 3+ comorbidities: Pacemaker, anxiety and depression, bradycardia, cervical myelopathy, charcot's foot, DMII, HTN, migraines, HLD, C3-6, ACDF 2020, L LE fasciotomy 2020, L2-4 laminectomy 2022  are also affecting patient's functional outcome.   REHAB POTENTIAL: Good  CLINICAL DECISION MAKING: Evolving/moderate complexity  EVALUATION COMPLEXITY: Moderate  PLAN:  PT FREQUENCY: 1-2x/week  PT DURATION: 6 weeks  PLANNED INTERVENTIONS: Therapeutic exercises, Therapeutic activity, Neuromuscular re-education, Balance training, Gait training, Patient/Family education, Self Care, Joint mobilization, Stair training, Vestibular training, Canalith repositioning, Aquatic Therapy, Dry Needling, Cryotherapy, Moist heat, Taping, Manual therapy, and Re-evaluation  PLAN FOR NEXT SESSION: 30 day hold; recheck BP; DN to R UT, LS, and possibly rhomboids and thoracic multifidi ;balance, STM and passive stretching to cervical musculature    Anette Guarneri, PT, DPT 11/12/22 11:52 AM  [facility] at [facility] [address] [place], [place] [address] Phone # [phone] Fax # [phone]    PHYSICAL THERAPY DISCHARGE SUMMARY  Visits from Start of Care: 10  Current functional level related to goals / functional outcomes: See above patient did not return   Remaining deficits: Imbalance, limited cervical AROM, weakness   Education / Equipment: HEP  Plan: Patient agrees to discharge.  Patient goals were not met. Patient is being discharged due to not returning.       Anette Guarneri, PT, DPT 02/28/23 12:45 PM  [facility] at [facility] [address] [place], [address] [place], [place] [address] Phone # [phone] Fax # [phone]

## 2022-11-12 ENCOUNTER — Ambulatory Visit: Payer: Medicare PPO | Admitting: Physical Therapy

## 2022-11-12 ENCOUNTER — Encounter: Payer: Self-pay | Admitting: Physical Therapy

## 2022-11-12 ENCOUNTER — Telehealth: Payer: Self-pay | Admitting: Physical Therapy

## 2022-11-12 DIAGNOSIS — M542 Cervicalgia: Secondary | ICD-10-CM

## 2022-11-12 DIAGNOSIS — R293 Abnormal posture: Secondary | ICD-10-CM | POA: Diagnosis not present

## 2022-11-12 DIAGNOSIS — R2681 Unsteadiness on feet: Secondary | ICD-10-CM

## 2022-11-12 DIAGNOSIS — M6281 Muscle weakness (generalized): Secondary | ICD-10-CM

## 2022-11-12 DIAGNOSIS — R42 Dizziness and giddiness: Secondary | ICD-10-CM

## 2022-11-12 NOTE — Telephone Encounter (Signed)
Hello,   I am seeing Antonio Oliver in [place] for cervical dystonia and imbalance. Patient has ongoing severe BP fluctuations which he struggles with, however today we also noticed that his spO2 was decreasing even at rest. See below. Please advise if any changes to POC.   TODAY'S TREATMENT: 11/12/22 Activity Comments  Vitals at start of session L UE 90/68 mmHg, 64 bpm, 83-98% spO2 *spO2 improved with deep breaths  Vitals mid-session 86/66 mmHg, 67 bpm, 84-91% spO2 *spO2 improved with deep breaths     Thanks,  Janene Harvey, PT, DPT 11/12/22 12:00 PM  [facility] at [facility] [address], [place], [place] [address] Phone # [phone] Fax # [phone]

## 2022-11-15 ENCOUNTER — Telehealth: Payer: Self-pay | Admitting: Physical Medicine & Rehabilitation

## 2022-11-15 NOTE — Telephone Encounter (Signed)
Patient's wife called requesting to know if patient needs to come in for botox injection before scheduled appointment 4/3. I informed appt is for a re- evaluation but she would like to make sure

## 2022-11-16 ENCOUNTER — Other Ambulatory Visit (HOSPITAL_COMMUNITY): Payer: Self-pay

## 2022-11-16 ENCOUNTER — Ambulatory Visit: Payer: Medicare PPO | Admitting: Physical Therapy

## 2022-11-17 ENCOUNTER — Ambulatory Visit: Payer: Medicare PPO | Admitting: Physical Medicine & Rehabilitation

## 2022-11-19 ENCOUNTER — Ambulatory Visit: Payer: Medicare PPO

## 2022-11-19 NOTE — Progress Notes (Unsigned)
NEUROLOGY FOLLOW UP OFFICE NOTE  Antonio Oliver [id number]  Assessment/Plan:   Dysautonomia/orthostatic hypotension and parkinsonism - suspect secondary to [place].  Testing is unremarkable for idiopathic/neurodegenerative parkinsonism Migraine without aura, without status migrainosus, not intractable   Migraine prevention:  Emgality every 4 weeks Migraine rescue:  Ubrelvy 100mg  Limit use of pain relievers to no more than 2 days out of week to prevent risk of rebound or medication-overuse headache. Keep headache diary Follow up ***    Subjective:  Antonio Oliver is a 67 year old male with complete heart block s/p PPM, s/p aortic valve repair, possible rheumatoid arthritis, diabetes, depression/anxiety, chronic back pain and cervical dystonia with history of cervical myelopathy.  He is accompanied by his wife who supplements history.   UPDATE: Intensity:  moderate Duration:  30 minutes to 3 hours Frequency:  More frequent over past couple of months - may have 2 a week but also may have 2 weeks without one.  Rescue therapy:  Ubrelvy Current NSAIDS:  ASA 81mg  Current analgesics:  tramadol (daily, for generalized pain); oxycodone (for general pain) Current triptans: none Current ergotamine:  none Current anti-emetic:  none Current muscle relaxants:  tizanidine Current anti-anxiolytic:  BuSpar; clonazepam Current sleep aide:  none Current Antihypertensive medications:  Toprol-XL, Lasix Current Antidepressant medications:  Cymbalta 60mg  twice daily, Wellbutrin SR 200mg  twice daily, Remeron 60mg  Current Anticonvulsant medications:  gabapentin 600mg  TID Current anti-CGRP:  Emgality; Ubrelvy 100mg  Current Vitamins/Herbal/Supplements:  Iron, folic acid, MVI Current Antihistamines/Decongestants:  Sudafed Other therapy:  none Other medications:  Mirapex   Caffeine:  1 cup coffee daily.   Diet:  Does not drink water or soda.   Exercise:  no Depression:  yes; Anxiety:  Yes.  Major  depressive disorder.  Needed to retire in 2002.   Other pain:  Arthralgias, chronic neck pain, Charcot foot Sleep hygiene:  poor   HISTORY: Migraines: Onset:  1980s Location:  Usually bifrontal Quality:  Squeezing, throbbing, pounding Initial Intensity:  7-8/10.  He denies new headache, thunderclap headache or severe headache that wakes from sleep. Aura:  none Premonitory Phase:  none Postdrome:  none Associated symptoms: Photophobia, phonophobia, blurred vision.  He denies associated nausea, vomiting, unilateral numbness or weakness. Initial Duration:  Usually a day.  Has happened up to 3 days. Initial Frequency:  10 to 15 days a month, started Botox in 2018 and improved once a month. Initial Frequency of abortive medication: 1 a month Triggers:  Unknown Relieving factors:  none Activity:  aggravates   CT head 11/14/18 personally reviewed and was normal. CT cervical spine 11/06/18 personally reviewed and demonstrated multilevel severe facet arthropathy with moderate bilateral neural foraminal stenosis at C3-4 and C4-5 as well as prior interbody fusion at C6-7 without stenosis.   Past NSAIDS:  ibuprofen Past analgesics:  Tylenol, Excedrin, Fioricet, Midrin Past abortive triptans:   Zomig 5mg , Sumatriptan 6mg  [place] Past abortive ergotamine: none Past muscle relaxants:  Zanaflex Past anti-emetic:  none Past antihypertensive medications:  Verapamil, propranolol, clonidine Past antidepressant medications:  amitriptyline Past anticonvulsant medications:  topiramate 200mg  Past anti-CGRP:  Aimovig 140mg  Past vitamins/Herbal/Supplements:  none Past antihistamines/decongestants:  none Other past therapies:  Botox (effective but did not like getting all those injections every 3 months)   Family history of headache:  2 brothers had migraines.    Orthostatic hypotension/secondary parkinsonism: He reports multiple episodes of his legs just giving out.  Chronic dizziness for about a year.  He  describes spinning.  Sometimes he  feels like he is going to pass out when he stands up.  He has orthostatic hypotension at times.  His cardiologist said it is not his heart.  He is wearing compression stockings.  Denies double vision.  Notes occasional tremor in hands.  No history of REM sleep behavior.  Skin biopsy negative for P-SYN.  PAST MEDICAL HISTORY: Past Medical History:  Diagnosis Date   Anxiety and depression    Ascending aorta dilation ([place]) 03/06/2019   4.3 cm 2022   Benign prostatic hyperplasia without lower urinary tract symptoms 11/17/2016   Last Assessment & Plan:  Stable PSA today.  Formatting of this note might be different from the original. Last Assessment & Plan:  Stable PSA today. Last Assessment & Plan:  Formatting of this note might be different from the original. Stable PSA today.   BPH (benign prostatic hyperplasia)    Bradycardia 02/01/2019   Cervical myelopathy ([place]) 11/14/2018   Charcot's joint of foot 03/05/2019   Chronic back pain 04/03/2013   Chronic inflammatory arthritis 11/17/2016   History of positive rheumatoid factor in the past. Denies placement on immunosuppressive therapy   Chronic pain syndrome    Failed back surgical syndrome.  Chronic neck pain.  Chronic low back pain with sciatica bilateral   Colon cancer screening    2023 cologuard neg   Complete heart block (HCC)    Following AVR s/p St Jude PPM in 03/2017   DM2 (diabetes mellitus, type 2) ([place]) 04/03/2013   Last Assessment & Plan:  Formatting of this note might be different from the original. Diabetes is unchanged.  Continue current treatment regimen. Regular aerobic exercise. Diabetes will be reassessed in 3 months. Will check A1c today. Denies any problems with feet or sensation.   Elevated PSA 11/2021   Enthesopathy of ankle and tarsus 04/04/2013   Last Assessment & Plan:  Continues with swelling after prolonged standing.  Still  Wearing support hose, which helps.   Essential hypertension  10/30/2018   FHx: migraine headaches 04/04/2013   Fracture of capitate bone of wrist 02/26/2019   Fracture of tibia 03/05/2019   GERD (gastroesophageal reflux disease) 10/30/2018   Glaucoma 04/03/2013   Hemangioma of skin and subcutaneous tissue 07/07/2017   History of aortic valve replacement 2018   bicuspid AV   History of kidney stones 11/17/2016   History of sleep apnea    wt loss->gone   Hypothyroidism 10/30/2018   IDA (iron deficiency anemia) 11/2021   malabs d/t gastric bipass   Lesion of plantar nerve 04/04/2013   Lumbar stenosis with neurogenic claudication 01/08/2021   Major depressive disorder, recurrent ([place])    ECT in the remote past.  Patient disabled due to recurrent depression and chronic pain.   Migraine syndrome    Mixed dyslipidemia 10/30/2018   Morbid obesity ([place]) 10/27/2011   Nondisplaced fracture of medial cuneiform of left foot, initial encounter for closed fracture 11/16/2018   Osteoarthritis 10/30/2018   Personal history of tobacco use, presenting hazards to health 04/04/2013   Presence of permanent cardiac pacemaker    Severe single current episode of major depressive disorder, without psychotic features ([place]) 11/17/2016   Status post bariatric surgery 04/05/2011   bipass surg --presurg wt 310 lbs   Syncope 02/14/2019   Vitamin D deficiency 10/30/2018    MEDICATIONS: Current Outpatient Medications on File Prior to Visit  Medication Sig Dispense Refill   aspirin EC 81 MG tablet Take 81 mg by mouth daily.     atorvastatin (  LIPITOR) 20 MG tablet Take 20 mg by mouth daily at 6 PM.      brexpiprazole (REXULTI) 2 MG TABS tablet Take 2 mg by mouth daily.     CALCIUM CITRATE PO Take 500 mg by mouth 2 (two) times daily.     Cholecalciferol (VITAMIN D-3 PO) Take 1 capsule by mouth daily.     clobetasol (TEMOVATE) 0.05 % external solution Apply topically.     clonazePAM (KLONOPIN) 0.5 MG tablet Take 0.5 mg by mouth 2 (two) times daily as needed for anxiety.      diclofenac Sodium (VOLTAREN) 1 % GEL Apply 1 application topically 4 (four) times daily.     docusate sodium (COLACE) 100 MG capsule Take 1 capsule (100 mg total) by mouth 2 (two) times daily. 10 capsule 0   DULoxetine (CYMBALTA) 60 MG capsule Take 60 mg by mouth 2 (two) times daily.      ferrous sulfate 325 (65 FE) MG EC tablet Take 325 mg by mouth daily.     finasteride (PROSCAR) 5 MG tablet Take 5 mg by mouth daily.     fluticasone (FLONASE) 50 MCG/ACT nasal spray Place 2 sprays into the nose daily as needed for allergies or rhinitis.     folic acid (FOLVITE) 1 MG tablet Take 1 mg by mouth daily.     furosemide (LASIX) 20 MG tablet Take 1 tablet (20 mg total) by mouth as needed (for leg swelling). 30 tablet 4   Galcanezumab-gnlm (EMGALITY) 120 MG/ML SOAJ Inject 120 mg into the skin every 28 (twenty-eight) days. 1 mL 11   hydrALAZINE (APRESOLINE) 10 MG tablet Take 1 tablet three times daily for systolic bp > [id number] or diastolic bp > 90 [id number] tablet 3   levothyroxine (SYNTHROID) 125 MCG tablet Take 125 mcg by mouth daily.      lidocaine (LIDODERM) 5 % SMARTSIG:Topical     loratadine (CLARITIN) 10 MG tablet Take 10 mg by mouth daily as needed for allergies.     losartan (COZAAR) 25 MG tablet Take 1 tablet (25 mg total) by mouth daily. 90 tablet 3   MAGNESIUM PO Take 1 tablet by mouth daily.     meclizine (ANTIVERT) 25 MG tablet Take 25 mg by mouth 3 (three) times daily as needed for dizziness.     metFORMIN (GLUCOPHAGE) 1000 MG tablet Take 1,000 mg by mouth 2 (two) times daily with a meal.     metoprolol succinate (TOPROL-XL) 25 MG 24 hr tablet Take 25 mg by mouth in the morning and at bedtime.     Multiple Vitamins-Minerals (MULTIVITAMIN WITH MINERALS) tablet Take 1 tablet by mouth daily.     Multiple Vitamins-Minerals (PRESERVISION/LUTEIN) CAPS Take 1 capsule by mouth at bedtime.     mupirocin ointment (BACTROBAN) 2 % Apply 1 application topically 3 (three) times daily as needed (infection).      naloxone (NARCAN) nasal spray 4 mg/0.1 mL Instill or spray into the nose if you are experiencing shortness of breath or signs of opioid overdose. 1 each 0   omeprazole (PRILOSEC) 20 MG capsule Take 20 mg by mouth 2 (two) times daily before a meal.      oxyCODONE-acetaminophen (PERCOCET) 10-325 MG tablet Take 0.5 tablets by mouth See admin instructions. 1/2 tablet every 3 hours.     polyethylene glycol (MIRALAX / GLYCOLAX) 17 g packet Take 17 g by mouth daily as needed.     potassium chloride (KLOR-CON) 10 MEQ tablet Take 10 mEq by mouth as needed (  with lasix dose).     pregabalin (LYRICA) 100 MG capsule Take 100 mg by mouth 3 (three) times daily.     Semaglutide,0.25 or 0.5MG /DOS, (OZEMPIC, 0.25 OR 0.5 MG/DOSE,) 2 MG/3ML SOPN Inject 0.5 mg into the skin once a week. 27 mL 3   silodosin (RAPAFLO) 8 MG CAPS capsule Take 8 mg by mouth daily.     solifenacin (VESICARE) 10 MG tablet Take 10 mg by mouth at bedtime.     tiZANidine (ZANAFLEX) 4 MG tablet Take 4 mg by mouth 3 (three) times daily.     Ubrogepant (UBRELVY) 100 MG TABS Take 1 tablet by mouth as needed (May repeat in 2 hours.  Maximum 2 tablets in 24 hours.). 16 tablet 11   No current facility-administered medications on file prior to visit.    ALLERGIES: Allergies  Allergen Reactions   Calcium Carbonate Other (See Comments)    Pt had gastric bypass, can not absorb calcium carbonate - able to absorb calcium citrate   Ms Contin [Morphine] Itching    Can take with Benadryl, facial and nose itching     FAMILY HISTORY: Family History  Problem Relation Age of Onset   Cancer Mother        breast   Rheum arthritis Mother    Diabetes Mother    Heart disease Father    Kidney disease Father    Diabetes Father    Hypertension Brother    Diabetes Brother    Osteoarthritis Brother    Heart disease Brother    Hypertension Brother    Diabetes Brother    Rheum arthritis Brother    Depression Daughter    Anxiety disorder Daughter        Objective:  *** General: No acute distress.  Patient appears ***-groomed.   Head:  Normocephalic/atraumatic Eyes:  Fundi examined but not visualized Neck: supple, no paraspinal tenderness, full range of motion Heart:  Regular rate and rhythm Lungs:  Clear to auscultation bilaterally Back: No paraspinal tenderness Neurological Exam: alert and oriented to person, place, and time.  Speech fluent and not dysarthric, language intact.  CN II-XII intact. Bulk and tone normal, muscle strength 5/5 throughout.  Sensation to light touch intact.  Deep tendon reflexes 2+ throughout, toes downgoing.  Finger to nose testing intact.  Gait normal, Romberg negative.   Metta Clines, DO  CC: ***

## 2022-11-23 ENCOUNTER — Ambulatory Visit: Payer: Medicare PPO | Admitting: Neurology

## 2022-11-23 ENCOUNTER — Encounter: Payer: Self-pay | Admitting: Neurology

## 2022-11-23 VITALS — BP 95/63 | HR 86 | Resp 20 | Ht 75.0 in | Wt 202.0 lb

## 2022-11-23 DIAGNOSIS — G2111 Neuroleptic induced parkinsonism: Secondary | ICD-10-CM

## 2022-11-23 DIAGNOSIS — I951 Orthostatic hypotension: Secondary | ICD-10-CM | POA: Diagnosis not present

## 2022-11-23 DIAGNOSIS — R531 Weakness: Secondary | ICD-10-CM

## 2022-11-23 DIAGNOSIS — G629 Polyneuropathy, unspecified: Secondary | ICD-10-CM | POA: Diagnosis not present

## 2022-11-23 DIAGNOSIS — T43505A Adverse effect of unspecified antipsychotics and neuroleptics, initial encounter: Secondary | ICD-10-CM

## 2022-11-23 DIAGNOSIS — R200 Anesthesia of skin: Secondary | ICD-10-CM

## 2022-11-23 NOTE — Patient Instructions (Addendum)
Check MRI of brain with and without contrast Check nerve conduction study of legs Discontinue Rexulti - it may take months before symptoms may improve.  Will contact you about when we need to re-evaluate Further recommendations pending results   We have sent a referral to [facility] for your MRI and they will call you directly to schedule your appointment. They are located at [place]. If you need to contact them directly please call [phone].

## 2022-11-24 ENCOUNTER — Telehealth: Payer: Self-pay | Admitting: *Deleted

## 2022-11-24 NOTE — Telephone Encounter (Signed)
LMVM- Calling to schedule 24 hour ambulatory blood pressure monitor. Gave brief description of test and [place] address and phone #.

## 2022-11-25 ENCOUNTER — Ambulatory Visit: Payer: Medicare PPO | Admitting: Neurology

## 2022-11-26 ENCOUNTER — Ambulatory Visit (INDEPENDENT_AMBULATORY_CARE_PROVIDER_SITE_OTHER): Payer: Medicare PPO

## 2022-11-26 DIAGNOSIS — I7121 Aneurysm of the ascending aorta, without rupture: Secondary | ICD-10-CM | POA: Diagnosis not present

## 2022-11-26 DIAGNOSIS — Z952 Presence of prosthetic heart valve: Secondary | ICD-10-CM

## 2022-11-26 DIAGNOSIS — R42 Dizziness and giddiness: Secondary | ICD-10-CM

## 2022-11-26 LAB — ECHOCARDIOGRAM COMPLETE
AR max vel: 1.28 cm2
AV Area VTI: 1.14 cm2
AV Area mean vel: 1.21 cm2
AV Mean grad: 9 mmHg
AV Peak grad: 18.5 mmHg
Ao pk vel: 2.15 m/s
Area-P 1/2: 4.89 cm2
MV M vel: 5.44 m/s
MV Peak grad: 118.2 mmHg
S' Lateral: 2.55 cm

## 2022-11-30 ENCOUNTER — Telehealth: Payer: Self-pay | Admitting: Cardiology

## 2022-11-30 NOTE — Telephone Encounter (Signed)
Patient's wife is returning a phone call

## 2022-12-02 NOTE — Progress Notes (Signed)
Remote pacemaker transmission.   

## 2022-12-03 ENCOUNTER — Other Ambulatory Visit (HOSPITAL_COMMUNITY): Payer: Self-pay

## 2022-12-08 ENCOUNTER — Encounter: Payer: Medicare PPO | Attending: Physical Medicine & Rehabilitation | Admitting: Physical Medicine & Rehabilitation

## 2022-12-08 ENCOUNTER — Encounter: Payer: Self-pay | Admitting: Physical Medicine & Rehabilitation

## 2022-12-08 VITALS — BP 137/85 | HR 70 | Ht 75.0 in | Wt 204.0 lb

## 2022-12-08 DIAGNOSIS — G243 Spasmodic torticollis: Secondary | ICD-10-CM | POA: Insufficient documentation

## 2022-12-08 NOTE — Progress Notes (Signed)
Subjective:    Patient ID: Antonio Oliver, male    DOB: [birth year], 67 y.o.   MRN: [id number]  HPI  Antonio Oliver is here in follow-up of his cervical dystonia.  In January I injected his right trapezius levator scapula and sternocleidomastoid muscles with 500 units total of Dysport.  He experienced improvement with the Dysport injections.  Additionally he has been participating in outpatient physical therapy to work on range of motion and posture.  He denies any additional unwanted weakness after the injections or any problems with swallowing.  He is doing some stretching on his own at home but it does not sound like it is consistent.  He does have a session or 2 of therapy remaining but they are waiting for him to come see me today before continuing on with anything else.  On a side note he reports increasing numbness in his left leg, particularly the foot with some associated weakness.  He has a history of a remote fracture as well as Charcot deformity.  He also has chronic diabetic peripheral neuropathy.  He is followed by neurology.  Apparently MRI of the brain was ordered.  Pain Inventory Average Pain 8 Pain Right Now 8 My pain is constant, sharp, burning, dull, tingling, and aching  LOCATION OF PAIN  neck shoulder wrist hand back   BOWEL Number of stools per week: 2 Oral laxative use Yes  Type of laxative Miralax, Docusate Enema or suppository use No  History of colostomy No  Incontinent No   BLADDER Normal In and out cath, frequency n/a Able to self cath  n/a Bladder incontinence Yes  Frequent urination Yes  Leakage with coughing No  Difficulty starting stream Yes  Incomplete bladder emptying Yes    Mobility walk with assistance use a cane ability to climb steps?  no do you drive?  yes Do you have any goals in this area?  yes  Function disabled: date disabled 10/2000  Neuro/Psych bladder control problems weakness numbness tremor trouble  walking spasms dizziness confusion anxiety  Prior Studies Any changes since last visit?  yes  Physicians involved in your care PCP Cher Nakai    Family History  Problem Relation Age of Onset   Cancer Mother        breast   Rheum arthritis Mother    Diabetes Mother    Heart disease Father    Kidney disease Father    Diabetes Father    Hypertension Brother    Diabetes Brother    Osteoarthritis Brother    Heart disease Brother    Hypertension Brother    Diabetes Brother    Rheum arthritis Brother    Depression Daughter    Anxiety disorder Daughter    Social History   Socioeconomic History   Marital status: Married    Spouse name: susan    Number of children: 1   Years of education: Not on file   Highest education level: Bachelor's degree (e.g., BA, AB, BS)  Occupational History   Not on file  Tobacco Use   Smoking status: Never   Smokeless tobacco: Never  Vaping Use   Vaping Use: Never used  Substance and Sexual Activity   Alcohol use: Yes    Alcohol/week: 1.0 standard drink of alcohol    Types: 1 Glasses of wine per week    Comment: occ.   Drug use: No   Sexual activity: Not on file  Other Topics Concern   Not on file  Social History Narrative   Patient lives at home with spouse [name]   Educ: BS   Occup: retired Art therapist.  Disabled due to depression and pain.   Tobacco: None   Alc: none   Right handed   Social Determinants of Health   Financial Resource Strain: Not on file  Food Insecurity: Not on file  Transportation Needs: Not on file  Physical Activity: Not on file  Stress: Not on file  Social Connections: Not on file   Past Surgical History:  Procedure Laterality Date   ANTERIOR CERVICAL DECOMP/DISCECTOMY FUSION N/A 11/17/2018   Procedure: CERVICAL THREE-CERVICAL FOUR, CERVICAL FOUR-CERVICAL FIVE, CERVICAL FIVE-CERVICAL [place];  Surgeon: Earnie Larsson, MD;  Location: [place];  Service:  Neurosurgery;  Laterality: N/A;   AORTIC VALVE REPLACEMENT  [id number]   APPLICATION OF WOUND VAC Left 04/05/2019   Procedure: Application Of Wound Vac;  Surgeon: Erle Crocker, MD;  Location: [place];  Service: Orthopedics;  Laterality: Left;   BACK SURGERY     x4   CARDIOVASCULAR STRESS TEST  05/05/2021   lexiscan neg   FASCIOTOMY Left 04/05/2019   Left leg 2 compartment fasciotomy   FASCIOTOMY Left 04/05/2019   Procedure: FASCIOTOMY LEFT LOWER LEG;  Surgeon: Erle Crocker, MD;  Location: [facility];  Service: Orthopedics;  Laterality: Left;   FOOT NEUROMA SURGERY     GASTRIC BYPASS  2012   HEMATOMA EVACUATION Left 04/05/2019   Procedure: Evacuation Hematoma Left Lower Leg;  Surgeon: Erle Crocker, MD;  Location: [place];  Service: Orthopedics;  Laterality: Left;   HEMORRHOID SURGERY     over 30 years ago   [place] umb   I & D EXTREMITY Left 04/13/2022   Procedure: IRRIGATION AND DEBRIDEMENT OF ELBOW AND BURSECTOMY;  Surgeon: Iran Planas, MD;  Location: [place];  Service: Orthopedics;  Laterality: Left;   LUMBAR LAMINECTOMY/DECOMPRESSION MICRODISCECTOMY N/A 01/08/2021   Procedure: CENTRAL LUMBAR LAMINECTOMY LUMBAR TWO-THREE, LUMBAR THREE-FOUR;  Surgeon: Jessy Oto, MD;  Location: [place];  Service: Orthopedics;  Laterality: N/A;   NASAL SINUS SURGERY     x2   NECK SURGERY     OLECRANON BURSECTOMY  2023   d/t septic bursitis   TRANSTHORACIC ECHOCARDIOGRAM  12/2020   EF normal, mod MR, ascending AA (4.4 cm)   Past Medical History:  Diagnosis Date   Anxiety and depression    Ascending aorta dilation ([place]) 03/06/2019   4.3 cm 2022   Benign prostatic hyperplasia without lower urinary tract symptoms 11/17/2016   Last Assessment & Plan:  Stable PSA today.  Formatting of this note might be different from the original. Last Assessment & Plan:  Stable PSA today. Last Assessment & Plan:  Formatting of this note might be different from the original. Stable PSA  today.   BPH (benign prostatic hyperplasia)    Bradycardia 02/01/2019   Cervical myelopathy ([place]) 11/14/2018   Charcot's joint of foot 03/05/2019   Chronic back pain 04/03/2013   Chronic inflammatory arthritis 11/17/2016   History of positive rheumatoid factor in the past. Denies placement on immunosuppressive therapy   Chronic pain syndrome    Failed back surgical syndrome.  Chronic neck pain.  Chronic low back pain with sciatica bilateral   Colon cancer screening    2023 cologuard neg   Complete heart block ([place])    Following AVR s/p St Jude PPM in 03/2017   DM2 (diabetes mellitus, type  2) ([place]) 04/03/2013   Last Assessment & Plan:  Formatting of this note might be different from the original. Diabetes is unchanged.  Continue current treatment regimen. Regular aerobic exercise. Diabetes will be reassessed in 3 months. Will check A1c today. Denies any problems with feet or sensation.   Elevated PSA 11/2021   Enthesopathy of ankle and tarsus 04/04/2013   Last Assessment & Plan:  Continues with swelling after prolonged standing.  Still  Wearing support hose, which helps.   Essential hypertension 10/30/2018   FHx: migraine headaches 04/04/2013   Fracture of capitate bone of wrist 02/26/2019   Fracture of tibia 03/05/2019   GERD (gastroesophageal reflux disease) 10/30/2018   Glaucoma 04/03/2013   Hemangioma of skin and subcutaneous tissue 07/07/2017   History of aortic valve replacement 2018   bicuspid AV   History of kidney stones 11/17/2016   History of sleep apnea    wt loss->gone   Hypothyroidism 10/30/2018   IDA (iron deficiency anemia) 11/2021   malabs d/t gastric bipass   Lesion of plantar nerve 04/04/2013   Lumbar stenosis with neurogenic claudication 01/08/2021   Major depressive disorder, recurrent ([place])    ECT in the remote past.  Patient disabled due to recurrent depression and chronic pain.   Migraine syndrome    Mixed dyslipidemia 10/30/2018   Morbid obesity ([place])  10/27/2011   Nondisplaced fracture of medial cuneiform of left foot, initial encounter for closed fracture 11/16/2018   Osteoarthritis 10/30/2018   Personal history of tobacco use, presenting hazards to health 04/04/2013   Presence of permanent cardiac pacemaker    Severe single current episode of major depressive disorder, without psychotic features ([place]) 11/17/2016   Status post bariatric surgery 04/05/2011   bipass surg --presurg wt 310 lbs   Syncope 02/14/2019   Vitamin D deficiency 10/30/2018   Ht 6\' 3"  (1.905 m)   Wt 204 lb (92.5 kg)   BMI 25.50 kg/m   Opioid Risk Score:   Fall Risk Score:  `1  Depression screen [facility] 2/9     12/08/2022   11:23 AM 08/25/2022   11:30 AM  Depression screen PHQ 2/9  Decreased Interest 1 2  Down, Depressed, Hopeless 0 3  PHQ - 2 Score 1 5  Altered sleeping  3  Tired, decreased energy  3  Change in appetite  2  Feeling bad or failure about yourself   2  Trouble concentrating  2  Moving slowly or fidgety/restless  2  Suicidal thoughts  1  PHQ-9 Score  20  Difficult doing work/chores  Very difficult      Review of Systems  Musculoskeletal:  Positive for back pain and neck pain.       Shoulder wrist rt hand rt  neck pain  All other systems reviewed and are negative.      Objective:   Physical Exam  General: No acute distress HEENT: NCAT, EOMI, oral membranes moist Cards: reg rate  Chest: normal effort Abdomen: Soft, NT, ND Skin: dry, intact Extremities: no edema Psych: pleasant and appropriate  Skin: Clean and intact without signs of breakdown Neuro:  Alert and oriented x 3. Fair insight and awareness.Some STM deficits. Normal language and speech. Cranial nerve exam unremarkable. RUE 4-/5 prox to 3+ to 4-/5 distally. LUE 4 to 4+/5 prox to distal. A bit weaker with grip in both uppers. Decreased LT in right hand > right. LE's 4/5. DTR's 1+.  Musculoskeletal: Head leans to right and is rotated to left, flexed  slightly forward.  Less severe and easier to reduce today.  Can come back almost to neutral with passive stretching. Right SCM is taut as well as  the right upper trapezius and levator scapulae. Left SCM has some tension but this seems to be from pull of neck. Neck is generally tender to palpation and PROM. General cervical kyphosis noted.            Assessment & Plan:  Cervical dystonia Hx of cervical spondylosis/ post-laminectomy syndrome with most recent surgery in March of 2020. He is fused from C3-6. Pt with chronic pain as a result. This is managed by Dr. Truman Hayward Chronic migraines managed by Dr. Tomi Likens Diabetic peripheral neuropathy with Charcot foot left lower extremity     Plan: Will repeat dysport but at 800 units of dysport to right sternocleidomastoid, trapezius, and Levator scapulae Will hold oiutpt physical therapy after injections to stretch these injected areas, for any appropriate modalities, and to address posture and HEP.  Aggressive HEP  Left lower extremity workup per neurology.  This may be just a manifestation of his peripheral neuropathy however as left leg has historically been more involved.     Twenty minutes of face to face patient care time were spent during this visit. All questions were encouraged and answered.  Follow up with me in about a month .

## 2022-12-08 NOTE — Patient Instructions (Signed)
ALWAYS FEEL FREE TO CALL OUR OFFICE WITH ANY PROBLEMS OR QUESTIONS ([phone])  **PLEASE NOTE** ALL MEDICATION REFILL REQUESTS (INCLUDING CONTROLLED SUBSTANCES) NEED TO BE MADE AT LEAST 7 DAYS PRIOR TO REFILL BEING DUE. ANY REFILL REQUESTS INSIDE THAT TIME FRAME MAY RESULT IN DELAYS IN RECEIVING YOUR PRESCRIPTION.                    

## 2022-12-09 ENCOUNTER — Telehealth (HOSPITAL_BASED_OUTPATIENT_CLINIC_OR_DEPARTMENT_OTHER): Payer: Self-pay

## 2022-12-09 ENCOUNTER — Ambulatory Visit (HOSPITAL_BASED_OUTPATIENT_CLINIC_OR_DEPARTMENT_OTHER)
Admission: RE | Admit: 2022-12-09 | Discharge: 2022-12-09 | Disposition: A | Discharge status: Home / Self Care | Payer: Medicare PPO | Source: Ambulatory Visit | Attending: Family | Admitting: Family

## 2022-12-09 ENCOUNTER — Encounter (HOSPITAL_BASED_OUTPATIENT_CLINIC_OR_DEPARTMENT_OTHER): Payer: Self-pay

## 2022-12-09 DIAGNOSIS — I7121 Aneurysm of the ascending aorta, without rupture: Secondary | ICD-10-CM

## 2022-12-09 LAB — POCT I-STAT CREATININE: Creatinine, Ser: 1 mg/dL (ref 0.61–1.24)

## 2022-12-09 MED ORDER — IOHEXOL 350 MG/ML SOLN
100.0000 mL | Freq: Once | INTRAVENOUS | Status: AC | PRN
  Administered 2022-12-09: 75 mL via INTRAVENOUS

## 2022-12-09 NOTE — Telephone Encounter (Addendum)
Results called to patient who verbalizes understanding!      ----- Message from Loel Dubonnet, NP sent at 12/09/2022  4:30 PM EDT ----- Ascending thoracic aorta 4.1 cm which is stable from previous. Repeat CT aorta 12/2023.  Small amount of fluid around the lungs and some atelectasis. If he is noting any dyspnea, cough, fever recommend follow up with primary care provider.

## 2022-12-10 ENCOUNTER — Ambulatory Visit: Payer: Medicare PPO | Admitting: Physical Therapy

## 2022-12-21 ENCOUNTER — Telehealth: Payer: Self-pay

## 2022-12-21 NOTE — Telephone Encounter (Signed)
Patients wife called and LVM asking for [place] to switch the order for his MRI to another location.

## 2022-12-21 NOTE — Telephone Encounter (Signed)
Will get the location changed.

## 2022-12-27 NOTE — Telephone Encounter (Signed)
Advised patient wife PA had to be restarted, Insurance will not allow Location change.  Fax received to send a copy of the last ov notes.    Records faxed [phone].

## 2023-01-03 ENCOUNTER — Ambulatory Visit: Payer: Medicare PPO | Admitting: Neurology

## 2023-01-03 DIAGNOSIS — G629 Polyneuropathy, unspecified: Secondary | ICD-10-CM | POA: Diagnosis not present

## 2023-01-03 NOTE — Procedures (Addendum)
[facility]  [address] [place], [address]  [place], [place] [address] Tel: [phone] Fax: [phone] Test Date:  01/03/2023  Patient: Antonio Oliver DOB: 11-Apr-1956 Physician: Jacquelyne Balint, MD  Sex: Male Height: 6\' 3"  Ref Phys: Shon Millet, DO  ID#: [id number]   Technician:    History: This is a 67 year old male with leg weakness and numbness.  NCV & EMG Findings: Extensive electrodiagnostic evaluation of the left upper and lower limbs shows: Left sural and superficial peroneal/fibular sensory responses are absent. Left median, ulnar, and radial sensory responses show prolonged distal peak latency (median 4.4, ulnar 4.0, radial 3.1 ms). This is complicated by difficulty maintaining temperature in left upper limb (~28-30 C despite warming). Left peroneal/fibular (EDB) and tibial (AH) motor responses are absent. Left median (APB) motor response shows a prolonged distal onset latency (4.4 ms) and mildly decreased conduction velocity (45 m/s). Left ulnar (ADM) motor response shows decreased conduction velocity below and above the elbow (below 41 m/s, above 37 m/s). As above, temperature in left upper limb could not be maintained above 32 C as per protocol. Left H reflex shows prolonged latency (39.7 ms). Chronic motor axon loss changes without accompanying active denervation changes are seen in the left tibialis anterior, medial head of gastrocnemius, first dorsal interosseous, flexor digitorum profundus to digits 4,5, and triceps.  Impression: This is an abnormal study. As mentioned above, temperature could not be maintained above 32 C in left upper limb (~28-30C) despite multiple attempts at warming. This may result in falsely elevated amplitudes and falsely prolonged distal latency and conduction velocities. With those caveats mentioned, the findings are most consistent with the following: Evidence of a large fiber sensorimotor neuropathy, axon loss in type, mild to moderate in  degree electrically. Possible evidence of a left ulnar mononeuropathy, proximal to the take off to the left flexor digitorum profundus to digits 4,5. Neuromuscular ultrasound could further evaluate if clinically indicated and would not be affected by temperature. Possible evidence of a left median mononeuropathy at or distal to the wrist, consistent with carpal tunnel syndrome. This may also be temperature artifact. Neuromuscular ultrasound could further evaluate if clinically indicated and would not be affected by temperature. Minimal chronic neurogenic changes in the left triceps muscle are seen on needle examination, with no active or ongoing changes, that is of unclear significance. This finding is too limited in degree and distribution for diagnostic purposes. No electrodiagnostic evidence of a left lumbosacral (L3-S1) motor radiculopathy. No definite electrodiagnostic evidence of a left cervical (C5-C8) motor radiculopathy.    ___________________________ Jacquelyne Balint, MD    Nerve Conduction Studies - Temperature in left upper limb was difficult to maintain, with temp ~28-30C despite warming  Motor Nerve Results    Latency Amplitude F-Lat Segment Distance CV Comment  Site (ms) Norm (mV) Norm (ms)  (cm) (m/s) Norm   Left Fibular (EDB) Motor  Ankle *NR  < 6.0 *NR  > 2.5        Bel fib head 1.58 - - -  Bel fib head-Ankle - -  > 40   Left Fibular (TA) Motor  Fib head 2.3  < 4.5 4.4  > 3.0        Pop fossa 4.7  < 6.7 2.5 -  Pop fossa-Fib head 10 42  > 40   Left Median (APB) Motor  Wrist *4.4  < 4.0 7.4  > 5.0        Elbow 11.9 - 7.1 -  Elbow-Wrist 33.5 *45  > 50   Left Tibial (AH) Motor  Ankle *NR  < 6.0 *NR  > 4.0        Knee *NR - *NR -  Knee-Ankle - *NR  > 40   Left Ulnar (ADM) Motor  Wrist 3.0  < 3.1 11.3  > 7.0        Bel elbow 9.1 - 9.3 -  Bel elbow-Wrist 25 *41  > 50   Ab elbow 11.8 - 9.0 -  Ab elbow-Bel elbow 10 *37 -    Sensory Sites    Neg Peak Lat Amplitude (O-P)  Segment Distance Velocity Comment  Site (ms) Norm (V) Norm  (cm) (ms)   Left Median Sensory  Wrist-Dig II *4.4  < 3.8 18  > 10 Wrist-Dig II 13    Left Radial Sensory  Forearm-Wrist *3.1  < 2.8 12  > 10 Forearm-Wrist 10    Left Superficial Fibular Sensory  14 cm-Ankle *NR  < 4.6 *NR  > 3 14 cm-Ankle 14    Left Sural Sensory  Calf-Lat mall *NR  < 4.6 *NR  > 3 Calf-Lat mall 14    Left Ulnar Sensory  Wrist *4.0  < 3.2 12  > 5 Wrist-Dig V 11     H-Reflex Results    M-Lat H Lat H Neg Amp H-M Lat  Site (ms) (ms) Norm (mV) (ms)  Left Tibial H-Reflex  Pop fossa 6.7 *39.7  < 35.0 0.58 33.0   Electromyography   Side Muscle Ins.Act Fibs Fasc Recrt Amp Dur Poly Activation Comment  Left Tib ant Nml Nml Nml *1- *1+ *1+ *1+ Nml N/A  Left Gastroc MH Nml Nml Nml *2- *1+ *2+ *2+ Nml N/A  Left Rectus fem Nml Nml Nml Nml Nml Nml Nml Nml N/A  Left Biceps fem SH Nml Nml Nml Nml Nml Nml Nml Nml N/A  Left Gluteus med Nml Nml Nml Nml Nml Nml Nml Nml N/A  Left FDI Nml Nml Nml *1- *1+ *1+ *1+ Nml N/A  Left EIP Nml Nml Nml Nml Nml Nml Nml Nml N/A  Left Pronator teres Nml Nml Nml Nml Nml Nml Nml Nml N/A  Left FDP Nml Nml Nml *1- *1+ *1+ *1+ Nml N/A  Left Biceps Nml Nml Nml Nml Nml Nml Nml Nml N/A  Left Triceps Nml Nml Nml *1- *1+ *1+ *1+ Nml N/A  Left Deltoid Nml Nml Nml Nml Nml Nml Nml Nml N/A      Waveforms:  Motor             Sensory             H-Reflex

## 2023-01-04 ENCOUNTER — Telehealth: Payer: Self-pay

## 2023-01-04 ENCOUNTER — Telehealth: Payer: Self-pay | Admitting: Cardiology

## 2023-01-04 ENCOUNTER — Telehealth: Payer: Self-pay | Admitting: Neurology

## 2023-01-04 NOTE — Telephone Encounter (Signed)
Caller stated she will be faxing over a cardiology order for patient who has a pacemaker to have MRI.

## 2023-01-04 NOTE — Progress Notes (Signed)
LM VM

## 2023-01-04 NOTE — Telephone Encounter (Signed)
Pa and notes faxed to [facility]

## 2023-01-04 NOTE — Telephone Encounter (Signed)
New message    Wife calling regarding MRI prior authorization

## 2023-01-04 NOTE — Telephone Encounter (Signed)
Have not received fax yet. Will continue to check today.

## 2023-01-04 NOTE — Telephone Encounter (Signed)
-----   Message from Drema Dallas, DO sent at 01/03/2023  4:08 PM EDT ----- Testing confirms neuropathy.  Would like to check labs looking for other causes of neuropathy - SSA/SSB antibodies, B12, ACE, IFE.  There is evidence of the ulnar nerve being pinched at the elbow that can affect.  There is also evidence of possible mild carpal tunnel and pinching of the nerve at the elbow, which may be artifact from low extremity temperature.

## 2023-01-05 ENCOUNTER — Encounter: Payer: Self-pay | Admitting: Physical Medicine & Rehabilitation

## 2023-01-05 ENCOUNTER — Encounter: Payer: Medicare PPO | Attending: Physical Medicine & Rehabilitation | Admitting: Physical Medicine & Rehabilitation

## 2023-01-05 VITALS — BP 113/74 | HR 61 | Temp 98.2°F | Ht 75.0 in | Wt 196.0 lb

## 2023-01-05 DIAGNOSIS — G243 Spasmodic torticollis: Secondary | ICD-10-CM | POA: Diagnosis not present

## 2023-01-05 MED ORDER — SODIUM CHLORIDE (PF) 0.9 % IJ SOLN
1.6000 mL | Freq: Once | INTRAMUSCULAR | Status: AC
Start: 2023-01-05 — End: 2023-01-05
  Administered 2023-01-05: 1.6 mL via INTRAVENOUS

## 2023-01-05 MED ORDER — ABOBOTULINUMTOXINA 500 UNITS IM SOLR
500.0000 [IU] | Freq: Once | INTRAMUSCULAR | Status: AC
Start: 2023-01-05 — End: 2023-01-05
  Administered 2023-01-05: 500 [IU] via INTRAMUSCULAR

## 2023-01-05 MED ORDER — ABOBOTULINUMTOXINA 300 UNITS IM SOLR
300.0000 [IU] | Freq: Once | INTRAMUSCULAR | Status: AC
Start: 2023-01-05 — End: 2023-01-05
  Administered 2023-01-05: 300 [IU] via INTRAMUSCULAR

## 2023-01-05 NOTE — Progress Notes (Signed)
Dysport Injection for spasticity using needle EMG guidance Indication:  Cervical dystonia G24.3     Dilution: 800 Units/41ml        Total Units Injected: 500 Indication: Severe spasticity which interferes with ADL,mobility and/or  hygiene and is unresponsive to medication management and other conservative care Informed consent was obtained after describing risks and benefits of the procedure with the patient. This includes bleeding, bruising, infection, excessive weakness, or medication side effects. A REMS form is on file and signed.   Side: right  Needle: 50mm injectable monopolar needle electrode   Number of units per muscle Trapezius 200 units 4 access points Levator scapulae 100 units  1  access points Sternocleidomastoid 500 units 4 access points Scalenes 0 units 0 access points     All injections were done after obtaining appropriate EMG activity and after negative drawback for blood. The patient tolerated the procedure well. Post procedure instructions were given.

## 2023-01-05 NOTE — Patient Instructions (Signed)
ALWAYS FEEL FREE TO CALL OUR OFFICE WITH ANY PROBLEMS OR QUESTIONS ([phone])  **PLEASE NOTE** ALL MEDICATION REFILL REQUESTS (INCLUDING CONTROLLED SUBSTANCES) NEED TO BE MADE AT LEAST 7 DAYS PRIOR TO REFILL BEING DUE. ANY REFILL REQUESTS INSIDE THAT TIME FRAME MAY RESULT IN DELAYS IN RECEIVING YOUR PRESCRIPTION.                    

## 2023-01-10 NOTE — Progress Notes (Signed)
HPI: Follow-up history of aortic valve replacement and thoracic aortic aneurysm. Patient has a history of bicuspid aortic valve and underwent aortic valve replacement March 10, 2017.  Procedure was complicated by complete heart block requiring pacemaker placement.  Note preoperative cardiac catheterization revealed no coronary disease. Nuclear study August 2022 showed no ischemia. CTA April 2023 showed 4.3 cm ascending aorta.  Renal Dopplers May 2023 showed 1 to 59% right and no stenosis on the left.  Carotid Dopplers March 2024 near normal on the right and left.  Echocardiogram March 2024 showed normal LV function, mild left ventricular hypertrophy, grade 2 diastolic dysfunction, severe biatrial enlargement, moderate to severe mitral regurgitation, status post AVR with mean gradient 9 mmHg, no aortic insufficiency and mildly dilated ascending aorta at 44 mm.  CTA April 2024 showed 4.1 cm ascending thoracic aortic aneurysm and small bilateral pleural effusions.  Since last seen patient continues to do poorly.  He has had problems with low blood pressure particularly over the past 8 weeks.  His amlodipine and losartan were discontinued but he still has occasions where his systolic blood pressure will drop into the 70s.  He feels weak with these episodes and associated dizziness.  He denies dyspnea, chest pain, palpitations or syncope.  He occasionally has blood pressure spikes but predominately has been running low.  He has seen neurology and his peripheral neuropathy has progressed significantly and they are concerned that he may have a neurological syndrome.  I do not have those details available.  He is scheduled for an MRI tomorrow.  Current Outpatient Medications  Medication Sig Dispense Refill   aspirin EC 81 MG tablet Take 81 mg by mouth daily.     atorvastatin (LIPITOR) 20 MG tablet Take 20 mg by mouth daily at 6 PM.      brexpiprazole (REXULTI) 2 MG TABS tablet Take 2 mg by mouth daily.      CALCIUM CITRATE PO Take 500 mg by mouth 2 (two) times daily.     Cholecalciferol (VITAMIN D-3 PO) Take 1 capsule by mouth daily.     clobetasol (TEMOVATE) 0.05 % external solution Apply topically.     clonazePAM (KLONOPIN) 0.5 MG tablet Take 0.5 mg by mouth 2 (two) times daily as needed for anxiety.     diclofenac Sodium (VOLTAREN) 1 % GEL Apply 1 application topically 4 (four) times daily.     docusate sodium (COLACE) 100 MG capsule Take 1 capsule (100 mg total) by mouth 2 (two) times daily. 10 capsule 0   DULoxetine (CYMBALTA) 60 MG capsule Take 60 mg by mouth 2 (two) times daily.      ferrous sulfate 325 (65 FE) MG EC tablet Take 325 mg by mouth daily.     finasteride (PROSCAR) 5 MG tablet Take 5 mg by mouth daily.     fluticasone (FLONASE) 50 MCG/ACT nasal spray Place 2 sprays into the nose daily as needed for allergies or rhinitis.     folic acid (FOLVITE) 1 MG tablet Take 1 mg by mouth daily.     furosemide (LASIX) 20 MG tablet Take 1 tablet (20 mg total) by mouth as needed (for leg swelling). 30 tablet 4   Galcanezumab-gnlm (EMGALITY) 120 MG/ML SOAJ Inject 120 mg into the skin every 28 (twenty-eight) days. 1 mL 11   hydrALAZINE (APRESOLINE) 10 MG tablet Take 1 tablet three times daily for systolic bp > 140 or diastolic bp > 90 161 tablet 3   levothyroxine (SYNTHROID) 125 MCG  tablet Take 125 mcg by mouth daily.      lidocaine (LIDODERM) 5 % SMARTSIG:Topical     loratadine (CLARITIN) 10 MG tablet Take 10 mg by mouth daily as needed for allergies.     MAGNESIUM PO Take 1 tablet by mouth daily.     meclizine (ANTIVERT) 25 MG tablet Take 25 mg by mouth 3 (three) times daily as needed for dizziness.     metFORMIN (GLUCOPHAGE) 1000 MG tablet Take 1,000 mg by mouth 2 (two) times daily with a meal.     Multiple Vitamins-Minerals (MULTIVITAMIN WITH MINERALS) tablet Take 1 tablet by mouth daily.     Multiple Vitamins-Minerals (PRESERVISION/LUTEIN) CAPS Take 1 capsule by mouth at bedtime.      mupirocin ointment (BACTROBAN) 2 % Apply 1 application topically 3 (three) times daily as needed (infection).     naloxone (NARCAN) nasal spray 4 mg/0.1 mL Instill or spray into the nose if you are experiencing shortness of breath or signs of opioid overdose. 1 each 0   omeprazole (PRILOSEC) 20 MG capsule Take 20 mg by mouth 2 (two) times daily before a meal.      oxyCODONE-acetaminophen (PERCOCET) 10-325 MG tablet Take 0.5 tablets by mouth See admin instructions. 1/2 tablet every 3 hours.     polyethylene glycol (MIRALAX / GLYCOLAX) 17 g packet Take 17 g by mouth daily as needed.     potassium chloride (KLOR-CON) 10 MEQ tablet Take 10 mEq by mouth as needed (with lasix dose).     pregabalin (LYRICA) 100 MG capsule Take 100 mg by mouth 3 (three) times daily.     Semaglutide,0.25 or 0.5MG /DOS, (OZEMPIC, 0.25 OR 0.5 MG/DOSE,) 2 MG/3ML SOPN Inject 0.5 mg into the skin once a week. 27 mL 3   silodosin (RAPAFLO) 8 MG CAPS capsule Take 8 mg by mouth daily.     solifenacin (VESICARE) 10 MG tablet Take 10 mg by mouth at bedtime.     tiZANidine (ZANAFLEX) 4 MG tablet Take 4 mg by mouth 3 (three) times daily.     Ubrogepant (UBRELVY) 100 MG TABS Take 1 tablet by mouth as needed (May repeat in 2 hours.  Maximum 2 tablets in 24 hours.). 16 tablet 11   No current facility-administered medications for this visit.     Past Medical History:  Diagnosis Date   Anxiety and depression    Ascending aorta dilation (HCC) 03/06/2019   4.3 cm 2022   Benign prostatic hyperplasia without lower urinary tract symptoms 11/17/2016   Last Assessment & Plan:  Stable PSA today.  Formatting of this note might be different from the original. Last Assessment & Plan:  Stable PSA today. Last Assessment & Plan:  Formatting of this note might be different from the original. Stable PSA today.   BPH (benign prostatic hyperplasia)    Bradycardia 02/01/2019   Cervical myelopathy (HCC) 11/14/2018   Charcot's joint of foot 03/05/2019    Chronic back pain 04/03/2013   Chronic inflammatory arthritis 11/17/2016   History of positive rheumatoid factor in the past. Denies placement on immunosuppressive therapy   Chronic pain syndrome    Failed back surgical syndrome.  Chronic neck pain.  Chronic low back pain with sciatica bilateral   Colon cancer screening    2023 cologuard neg   Complete heart block (HCC)    Following AVR s/p [employer] Jude PPM in 03/2017   DM2 (diabetes mellitus, type 2) (HCC) 04/03/2013   Last Assessment & Plan:  Formatting of this note  might be different from the original. Diabetes is unchanged.  Continue current treatment regimen. Regular aerobic exercise. Diabetes will be reassessed in 3 months. Will check A1c today. Denies any problems with feet or sensation.   Elevated PSA 11/2021   Enthesopathy of ankle and tarsus 04/04/2013   Last Assessment & Plan:  Continues with swelling after prolonged standing.  Still  Wearing support hose, which helps.   Essential hypertension 10/30/2018   FHx: migraine headaches 04/04/2013   Fracture of capitate bone of wrist 02/26/2019   Fracture of tibia 03/05/2019   GERD (gastroesophageal reflux disease) 10/30/2018   Glaucoma 04/03/2013   Hemangioma of skin and subcutaneous tissue 07/07/2017   History of aortic valve replacement 2018   bicuspid AV   History of kidney stones 11/17/2016   History of sleep apnea    wt loss->gone   Hypothyroidism 10/30/2018   IDA (iron deficiency anemia) 11/2021   malabs d/t gastric bipass   Lesion of plantar nerve 04/04/2013   Lumbar stenosis with neurogenic claudication 01/08/2021   Major depressive disorder, recurrent (HCC)    ECT in the remote past.  Patient disabled due to recurrent depression and chronic pain.   Migraine syndrome    Mixed dyslipidemia 10/30/2018   Morbid obesity (HCC) 10/27/2011   Nondisplaced fracture of medial cuneiform of left foot, initial encounter for closed fracture 11/16/2018   Osteoarthritis 10/30/2018    Personal history of tobacco use, presenting hazards to health 04/04/2013   Presence of permanent cardiac pacemaker    Severe single current episode of major depressive disorder, without psychotic features (HCC) 11/17/2016   Status post bariatric surgery 04/05/2011   bipass surg --presurg wt 310 lbs   Syncope 02/14/2019   Vitamin D deficiency 10/30/2018    Past Surgical History:  Procedure Laterality Date   ANTERIOR CERVICAL DECOMP/DISCECTOMY FUSION N/A 11/17/2018   Procedure: CERVICAL THREE-CERVICAL FOUR, CERVICAL FOUR-CERVICAL FIVE, CERVICAL FIVE-CERVICAL SIX ANTERIOR CERVICAL DECOMPRESSION/DISCECTOMY FUSION;  Surgeon: Julio Sicks, MD;  Location: [facility] OR;  Service: Neurosurgery;  Laterality: N/A;   AORTIC VALVE REPLACEMENT  03/2017   APPLICATION OF WOUND VAC Left 04/05/2019   Procedure: Application Of Wound Vac;  Surgeon: Terance Hart, MD;  Location: [facility] OR;  Service: Orthopedics;  Laterality: Left;   BACK SURGERY     x4   CARDIOVASCULAR STRESS TEST  05/05/2021   lexiscan neg   FASCIOTOMY Left 04/05/2019   Left leg 2 compartment fasciotomy   FASCIOTOMY Left 04/05/2019   Procedure: FASCIOTOMY LEFT LOWER LEG;  Surgeon: Terance Hart, MD;  Location: [facility] OR;  Service: Orthopedics;  Laterality: Left;   FOOT NEUROMA SURGERY     GASTRIC BYPASS  2012   HEMATOMA EVACUATION Left 04/05/2019   Procedure: Evacuation Hematoma Left Lower Leg;  Surgeon: Terance Hart, MD;  Location: [facility] OR;  Service: Orthopedics;  Laterality: Left;   HEMORRHOID SURGERY     over 30 years ago   HERNIA REPAIR     LIH umb   I & D EXTREMITY Left 04/13/2022   Procedure: IRRIGATION AND DEBRIDEMENT OF ELBOW AND BURSECTOMY;  Surgeon: Bradly Bienenstock, MD;  Location: [facility] OR;  Service: Orthopedics;  Laterality: Left;   LUMBAR LAMINECTOMY/DECOMPRESSION MICRODISCECTOMY N/A 01/08/2021   Procedure: CENTRAL LUMBAR LAMINECTOMY LUMBAR TWO-THREE, LUMBAR THREE-FOUR;  Surgeon: Kerrin Champagne, MD;  Location: [facility] OR;   Service: Orthopedics;  Laterality: N/A;   NASAL SINUS SURGERY     x2   NECK SURGERY     OLECRANON BURSECTOMY  2023  d/t septic bursitis   TRANSTHORACIC ECHOCARDIOGRAM  12/2020   EF normal, mod MR, ascending AA (4.4 cm)    Social History   Socioeconomic History   Marital status: Married    Spouse name: susan    Number of children: 1   Years of education: Not on file   Highest education level: Bachelor's degree (e.g., BA, AB, BS)  Occupational History   Not on file  Tobacco Use   Smoking status: Never   Smokeless tobacco: Never  Vaping Use   Vaping Use: Never used  Substance and Sexual Activity   Alcohol use: Yes    Alcohol/week: 1.0 standard drink of alcohol    Types: 1 Glasses of wine per week    Comment: occ.   Drug use: No   Sexual activity: Not on file  Other Topics Concern   Not on file  Social History Narrative   Patient lives at home with spouse Darl Pikes   Educ: BS   Occup: retired Banker.  Disabled due to depression and pain.   Tobacco: None   Alc: none   Right handed   Social Determinants of Health   Financial Resource Strain: Not on file  Food Insecurity: Not on file  Transportation Needs: Not on file  Physical Activity: Not on file  Stress: Not on file  Social Connections: Not on file  Intimate Partner Violence: Not At Risk (02/15/2019)   Humiliation, Afraid, Rape, and Kick questionnaire    Fear of Current or Ex-Partner: No    Emotionally Abused: No    Physically Abused: No    Sexually Abused: No    Family History  Problem Relation Age of Onset   Cancer Mother        breast   Rheum arthritis Mother    Diabetes Mother    Heart disease Father    Kidney disease Father    Diabetes Father    Hypertension Brother    Diabetes Brother    Osteoarthritis Brother    Heart disease Brother    Hypertension Brother    Diabetes Brother    Rheum arthritis Brother    Depression Daughter    Anxiety disorder Daughter     ROS: no fevers or  chills, productive cough, hemoptysis, dysphasia, odynophagia, melena, hematochezia, dysuria, hematuria, rash, seizure activity, orthopnea, PND, pedal edema, claudication. Remaining systems are negative.  Physical Exam: Well-developed well-nourished in no acute distress.  Skin is warm and dry.  HEENT is normal.  Neck is supple.  Chest is clear to auscultation with normal expansion.  Cardiovascular exam is regular rate and rhythm.  Abdominal exam nontender or distended. No masses palpated. Extremities show no edema. neuro grossly intact  ECG- personally reviewed  A/P  1 status post aortic valve replacement-continue SBE prophylaxis.  2 mitral regurgitation-moderate to severe on recent echocardiogram.  Will plan repeat study March 2025.  3 hypertension-blood pressure has been low and occasionally 70.  I have asked him to decrease his Toprol from 25 mg twice daily to 25 mg daily for 2 days then discontinue after that.  He will continue to take hydralazine as needed for blood pressure spikes.  I am unclear as to the etiology of his hypotension and overall weakness.  Previous renal Dopplers were negative for significant renal artery stenosis and his blood pressure is now running low.  He is being seen by neurology and apparently his peripheral neuropathy has progressed significantly and an MRI has been performed with results pending.  There is concern he may have a neurological syndrome.  Will continue to assess.  Very difficult situation.  4 thoracic aortic aneurysm-plan follow-up CTA April 2025.  5 hyperlipidemia-continue statin.  6 pacemaker-followed by electrophysiology.  7 palpitations-continue beta-blocker.  Symptoms are controlled at present.  Olga Millers, MD

## 2023-01-10 NOTE — Telephone Encounter (Signed)
Form completed and faxed. 

## 2023-01-13 ENCOUNTER — Telehealth: Payer: Self-pay

## 2023-01-13 NOTE — Telephone Encounter (Signed)
PA request received via CMM for Ubrelvy 100MG  tablets  PA submitted to [facility] and is pending determination  Key: [id number]

## 2023-01-14 ENCOUNTER — Other Ambulatory Visit (HOSPITAL_COMMUNITY): Payer: Self-pay

## 2023-01-17 ENCOUNTER — Ambulatory Visit (INDEPENDENT_AMBULATORY_CARE_PROVIDER_SITE_OTHER): Payer: Medicare PPO

## 2023-01-17 DIAGNOSIS — I442 Atrioventricular block, complete: Secondary | ICD-10-CM | POA: Diagnosis not present

## 2023-01-17 LAB — CUP PACEART REMOTE DEVICE CHECK
Battery Remaining Longevity: 67 mo
Battery Remaining Percentage: 52 %
Battery Voltage: 3.01 V
Brady Statistic AP VP Percent: 1 %
Brady Statistic AP VS Percent: 28 %
Brady Statistic AS VP Percent: 1.2 %
Brady Statistic AS VS Percent: 70 %
Brady Statistic RA Percent Paced: 28 %
Brady Statistic RV Percent Paced: 2.1 %
Date Time Interrogation Session: 20240513051249
Implantable Lead Connection Status: 753985
Implantable Lead Connection Status: 753985
Implantable Lead Implant Date: 20180710
Implantable Lead Implant Date: 20180710
Implantable Lead Location: 753859
Implantable Lead Location: 753860
Implantable Pulse Generator Implant Date: 20180710
Lead Channel Impedance Value: 460 Ohm
Lead Channel Impedance Value: 490 Ohm
Lead Channel Pacing Threshold Amplitude: 0.5 V
Lead Channel Pacing Threshold Amplitude: 0.875 V
Lead Channel Pacing Threshold Pulse Width: 0.4 ms
Lead Channel Pacing Threshold Pulse Width: 0.4 ms
Lead Channel Sensing Intrinsic Amplitude: 1.6 mV
Lead Channel Sensing Intrinsic Amplitude: 2.2 mV
Lead Channel Setting Pacing Amplitude: 1.125
Lead Channel Setting Pacing Amplitude: 2 V
Lead Channel Setting Pacing Pulse Width: 0.4 ms
Lead Channel Setting Sensing Sensitivity: 0.5 mV
Pulse Gen Model: 2272
Pulse Gen Serial Number: 8912213

## 2023-01-18 ENCOUNTER — Telehealth: Payer: Self-pay

## 2023-01-18 NOTE — Progress Notes (Addendum)
NEUROLOGY FOLLOW UP OFFICE NOTE  [name] [id number]  Assessment/Plan:   Dysautonomia/orthostatic hypotension.  Testing points against parkinsonian syndrome such as Multiple System Atrophy.  Would evaluate for an autoimmune autonomic ganglionopathy. 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   Testing is unremarkable for idiopathic/neurodegenerative parkinsonism (Parkinson's disease or Multiple system atrophy).  I explained that he must be off of Rixulti for 6 months before determining if symptoms may be related to another etiology.   Sensorimotor axonal polyneuropathy - he does have diabetes, which can explain etiology.  Would still evaluate for alternative etiologies. Migraine without aura, without status migrainosus, not intractable - increased frequency likely aggravated by his other medical comorbidities.     To evaluate for autoimmune autonomic ganglionopathy, will check serum for anti-ganglionic AChR antibodies, B12, ACE, IFE, SSA/SSB antibodies . Continue Bernita Raisin and Emgality for migraines Off Rixulti.  Would need to re-evaluate in several months.. Follow up in 4 months.  90 minutes spent reviewing notes, reviewing MRI of brain, and face to face with patient and his wife discussing results, differential diagnosis and plan in this complex patient.    Subjective:  Antonio Oliver is a 67 year old male with complete heart block s/p PPM, s/p aortic valve repair, positive RF (rheumatoid arthritis not suspected), diabetes, depression/anxiety, chronic back pain and cervical dystonia with history of cervical myelopathy who follows up for continued physical decline.  He is accompanied by his wife who supplements history.   UPDATE: He has been off of Rixulti since last month.  NCV-EMG of left upper extremity and lower limbs on 01/03/2023 revealed evidence of mild to moderate large fiber sensorimotor axonal neuropathy.  Also noted were possible evidence  of left ulnar mononeuropathy and left CTS which may be affected by low temperature.  Wanted to check B12, ACE, IFE and SSA/SSB antibodies.  Last visit, he endorsed left facial numbness.  MRI of brain with and without contrast on 01/21/2023 personally reviewed revealed chronic small vessel ischemic changes, incidental small left vertex arachnoid cyst but no acute intracranial abnormalities.   Migraine have increased.  They respond quickly to [place] but are now occurring 3 to 4 times a week.  HISTORY: Orthostatic hypotension/secondary parkinsonism: He reports multiple episodes of his legs just giving out.  Chronic dizziness for about a year.  He describes spinning.  Sometimes he feels like he is going to pass out when he stands up.  He has orthostatic hypotension at times.  His cardiologist said it is not his heart.  He is wearing compression stockings.  Denies double vision.  Notes occasional tremor in hands.  No history of REM sleep behavior.  Due to pain, he was unable to lay down for DaT scan.  Skin biopsy negative for P-SYN.  He progressively declined.  He is extremely weak and cannot move his legs.  He is experiencing more tremors.  Blood pressure profoundly fluctuates.  Seen in ED on 10/25/2022.  CT head personally reviewed revealed stable arachnoid cyst at the vertex and moderate chronic small vessel ischemic changes but no acute abnormality.  Cardiology does not have an answer.  He has also had progressive weakness.  He has history of cervical stenosis with myelopathy and lumbar laminectomy.  MRI of cervical spine on 06/22/2022 showed stable postoperative changes from C3-6 with chronic myelomalacia at C3-4 level of spinal cord but no acute myelopathy or compressive spinal stenosis.   MRI of lumbar spine showed postoperative changes of decompressive laminectomy at L3-4 and interbody  fusion at L4-5 and L5-S1 without any significant spinal canal or neural foraminal stenosis.    On exam, he exhibits  neuropathy in the feet up to mid-shin, which his wife says has progressed.  In December, his podiatrist checked him and he only exhibited sensory loss in the feet.    Migraines: Onset:  1980s Location:  Usually bifrontal Quality:  Squeezing, throbbing, pounding Initial Intensity:  7-8/10.  He denies new headache, thunderclap headache or severe headache that wakes from sleep. Aura:  none Premonitory Phase:  none Postdrome:  none Associated symptoms: Photophobia, phonophobia, blurred vision.  He denies associated nausea, vomiting, unilateral numbness or weakness. Initial Duration:  Usually a day.  Has happened up to 3 days. Initial Frequency:  10 to 15 days a month, started Botox in 2018 and improved once a month. Initial Frequency of abortive medication: 1 a month Triggers:  Unknown Relieving factors:  none Activity:  aggravates   CT head 11/14/18 personally reviewed and was normal. CT cervical spine 11/06/18 personally reviewed and demonstrated multilevel severe facet arthropathy with moderate bilateral neural foraminal stenosis at C3-4 and C4-5 as well as prior interbody fusion at C6-7 without stenosis.   Past NSAIDS:  ibuprofen Past analgesics:  Tylenol, Excedrin, Fioricet, Midrin Past abortive triptans:   Zomig 5mg , Sumatriptan 6mg  [place] Past abortive ergotamine: none Past muscle relaxants:  Zanaflex Past anti-emetic:  none Past antihypertensive medications:  Verapamil, propranolol, clonidine Past antidepressant medications:  amitriptyline Past anticonvulsant medications:  topiramate 200mg  Past anti-CGRP:  Aimovig 140mg  Past vitamins/Herbal/Supplements:  none Past antihistamines/decongestants:  none Other past therapies:  Botox (effective but did not like getting all those injections every 3 months)   Family history of headache:  2 brothers had migraines.      PAST MEDICAL HISTORY: Past Medical History:  Diagnosis Date   Anxiety and depression    Ascending aorta dilation (HCC)  03/06/2019   4.3 cm 2022   Benign prostatic hyperplasia without lower urinary tract symptoms 11/17/2016   Last Assessment & Plan:  Stable PSA today.  Formatting of this note might be different from the original. Last Assessment & Plan:  Stable PSA today. Last Assessment & Plan:  Formatting of this note might be different from the original. Stable PSA today.   BPH (benign prostatic hyperplasia)    Bradycardia 02/01/2019   Cervical myelopathy (HCC) 11/14/2018   Charcot's joint of foot 03/05/2019   Chronic back pain 04/03/2013   Chronic inflammatory arthritis 11/17/2016   History of positive rheumatoid factor in the past. Denies placement on immunosuppressive therapy   Chronic pain syndrome    Failed back surgical syndrome.  Chronic neck pain.  Chronic low back pain with sciatica bilateral   Colon cancer screening    2023 cologuard neg   Complete heart block (HCC)    Following AVR s/p [employer] Jude PPM in 03/2017   DM2 (diabetes mellitus, type 2) (HCC) 04/03/2013   Last Assessment & Plan:  Formatting of this note might be different from the original. Diabetes is unchanged.  Continue current treatment regimen. Regular aerobic exercise. Diabetes will be reassessed in 3 months. Will check A1c today. Denies any problems with feet or sensation.   Elevated PSA 11/2021   Enthesopathy of ankle and tarsus 04/04/2013   Last Assessment & Plan:  Continues with swelling after prolonged standing.  Still  Wearing support hose, which helps.   Essential hypertension 10/30/2018   FHx: migraine headaches 04/04/2013   Fracture of capitate bone of wrist 02/26/2019  Fracture of tibia 03/05/2019   GERD (gastroesophageal reflux disease) 10/30/2018   Glaucoma 04/03/2013   Hemangioma of skin and subcutaneous tissue 07/07/2017   History of aortic valve replacement 2018   bicuspid AV   History of kidney stones 11/17/2016   History of sleep apnea    wt loss->gone   Hypothyroidism 10/30/2018   IDA (iron deficiency  anemia) 11/2021   malabs d/t gastric bipass   Lesion of plantar nerve 04/04/2013   Lumbar stenosis with neurogenic claudication 01/08/2021   Major depressive disorder, recurrent (HCC)    ECT in the remote past.  Patient disabled due to recurrent depression and chronic pain.   Migraine syndrome    Mixed dyslipidemia 10/30/2018   Morbid obesity (HCC) 10/27/2011   Nondisplaced fracture of medial cuneiform of left foot, initial encounter for closed fracture 11/16/2018   Osteoarthritis 10/30/2018   Personal history of tobacco use, presenting hazards to health 04/04/2013   Presence of permanent cardiac pacemaker    Severe single current episode of major depressive disorder, without psychotic features (HCC) 11/17/2016   Status post bariatric surgery 04/05/2011   bipass surg --presurg wt 310 lbs   Syncope 02/14/2019   Vitamin D deficiency 10/30/2018    MEDICATIONS: Current Outpatient Medications on File Prior to Visit  Medication Sig Dispense Refill   aspirin EC 81 MG tablet Take 81 mg by mouth daily.     atorvastatin (LIPITOR) 20 MG tablet Take 20 mg by mouth daily at 6 PM.      brexpiprazole (REXULTI) 2 MG TABS tablet Take 2 mg by mouth daily.     CALCIUM CITRATE PO Take 500 mg by mouth 2 (two) times daily.     Cholecalciferol (VITAMIN D-3 PO) Take 1 capsule by mouth daily.     clobetasol (TEMOVATE) 0.05 % external solution Apply topically.     clonazePAM (KLONOPIN) 0.5 MG tablet Take 0.5 mg by mouth 2 (two) times daily as needed for anxiety.     diclofenac Sodium (VOLTAREN) 1 % GEL Apply 1 application topically 4 (four) times daily.     docusate sodium (COLACE) 100 MG capsule Take 1 capsule (100 mg total) by mouth 2 (two) times daily. 10 capsule 0   DULoxetine (CYMBALTA) 60 MG capsule Take 60 mg by mouth 2 (two) times daily.      ferrous sulfate 325 (65 FE) MG EC tablet Take 325 mg by mouth daily.     finasteride (PROSCAR) 5 MG tablet Take 5 mg by mouth daily.     fluticasone (FLONASE)  50 MCG/ACT nasal spray Place 2 sprays into the nose daily as needed for allergies or rhinitis.     folic acid (FOLVITE) 1 MG tablet Take 1 mg by mouth daily.     furosemide (LASIX) 20 MG tablet Take 1 tablet (20 mg total) by mouth as needed (for leg swelling). 30 tablet 4   Galcanezumab-gnlm (EMGALITY) 120 MG/ML SOAJ Inject 120 mg into the skin every 28 (twenty-eight) days. 1 mL 11   hydrALAZINE (APRESOLINE) 10 MG tablet Take 1 tablet three times daily for systolic bp > 140 or diastolic bp > 90 161 tablet 3   levothyroxine (SYNTHROID) 125 MCG tablet Take 125 mcg by mouth daily.      lidocaine (LIDODERM) 5 % SMARTSIG:Topical     loratadine (CLARITIN) 10 MG tablet Take 10 mg by mouth daily as needed for allergies.     losartan (COZAAR) 25 MG tablet Take 1 tablet (25 mg total) by mouth  daily. 90 tablet 3   MAGNESIUM PO Take 1 tablet by mouth daily.     meclizine (ANTIVERT) 25 MG tablet Take 25 mg by mouth 3 (three) times daily as needed for dizziness.     metFORMIN (GLUCOPHAGE) 1000 MG tablet Take 1,000 mg by mouth 2 (two) times daily with a meal.     metoprolol succinate (TOPROL-XL) 25 MG 24 hr tablet Take 25 mg by mouth in the morning and at bedtime.     Multiple Vitamins-Minerals (MULTIVITAMIN WITH MINERALS) tablet Take 1 tablet by mouth daily.     Multiple Vitamins-Minerals (PRESERVISION/LUTEIN) CAPS Take 1 capsule by mouth at bedtime.     mupirocin ointment (BACTROBAN) 2 % Apply 1 application topically 3 (three) times daily as needed (infection).     naloxone (NARCAN) nasal spray 4 mg/0.1 mL Instill or spray into the nose if you are experiencing shortness of breath or signs of opioid overdose. 1 each 0   omeprazole (PRILOSEC) 20 MG capsule Take 20 mg by mouth 2 (two) times daily before a meal.      oxyCODONE-acetaminophen (PERCOCET) 10-325 MG tablet Take 0.5 tablets by mouth See admin instructions. 1/2 tablet every 3 hours.     polyethylene glycol (MIRALAX / GLYCOLAX) 17 g packet Take 17 g by  mouth daily as needed.     potassium chloride (KLOR-CON) 10 MEQ tablet Take 10 mEq by mouth as needed (with lasix dose).     pregabalin (LYRICA) 100 MG capsule Take 100 mg by mouth 3 (three) times daily.     Semaglutide,0.25 or 0.5MG /DOS, (OZEMPIC, 0.25 OR 0.5 MG/DOSE,) 2 MG/3ML SOPN Inject 0.5 mg into the skin once a week. 27 mL 3   silodosin (RAPAFLO) 8 MG CAPS capsule Take 8 mg by mouth daily.     solifenacin (VESICARE) 10 MG tablet Take 10 mg by mouth at bedtime.     tiZANidine (ZANAFLEX) 4 MG tablet Take 4 mg by mouth 3 (three) times daily.     Ubrogepant (UBRELVY) 100 MG TABS Take 1 tablet by mouth as needed (May repeat in 2 hours.  Maximum 2 tablets in 24 hours.). 16 tablet 11   No current facility-administered medications on file prior to visit.    ALLERGIES: Allergies  Allergen Reactions   Calcium Carbonate Other (See Comments)    Pt had gastric bypass, can not absorb calcium carbonate - able to absorb calcium citrate   Ms Contin [Morphine] Itching    Can take with Benadryl, facial and nose itching     FAMILY HISTORY: Family History  Problem Relation Age of Onset   Cancer Mother        breast   Rheum arthritis Mother    Diabetes Mother    Heart disease Father    Kidney disease Father    Diabetes Father    Hypertension Brother    Diabetes Brother    Osteoarthritis Brother    Heart disease Brother    Hypertension Brother    Diabetes Brother    Rheum arthritis Brother    Depression Daughter    Anxiety disorder Daughter       Objective:  Blood pressure 126/80, pulse 60, height 6\' 3"  (1.905 m), SpO2 99 %.. General: No acute distress.  Patient appears well-groomed.   Head:  Normocephalic/atraumatic Eyes:  Fundi examined but not visualized Neck: supple, no paraspinal tenderness, full range of motion Heart:  Regular rate and rhythm Neurological Exam: Alert and oriented.  Speech fluent and not dysarthric.  Language intact.  CN II-XII intact.  Tone normal.  Muscle  strength 4/5 throughout.  Hypomimia.  Bradykinetic.  Mild postural and kinetic tremor in hands.  Sensation to light touch intact.  Finger to nose intact.  Shuffling gait but cannot ambulate far.  Shon Millet, DO  CC:  Simone Curia, MD

## 2023-01-18 NOTE — Telephone Encounter (Signed)
Spoke to the patient's wife, MRI scheduled for today at 11 am.  She will make sure she has the report as well as the CD at the visit on Thursday with Dr.Jaffe.

## 2023-01-19 ENCOUNTER — Ambulatory Visit: Payer: Medicare PPO | Admitting: Cardiology

## 2023-01-19 ENCOUNTER — Encounter: Payer: Self-pay | Admitting: Cardiology

## 2023-01-19 ENCOUNTER — Ambulatory Visit: Payer: Medicare PPO | Admitting: Physical Medicine & Rehabilitation

## 2023-01-19 VITALS — BP 84/54 | HR 63 | Ht 75.0 in | Wt 198.0 lb

## 2023-01-19 DIAGNOSIS — I340 Nonrheumatic mitral (valve) insufficiency: Secondary | ICD-10-CM | POA: Diagnosis not present

## 2023-01-19 DIAGNOSIS — I7121 Aneurysm of the ascending aorta, without rupture: Secondary | ICD-10-CM | POA: Diagnosis not present

## 2023-01-19 DIAGNOSIS — I10 Essential (primary) hypertension: Secondary | ICD-10-CM

## 2023-01-19 DIAGNOSIS — Z952 Presence of prosthetic heart valve: Secondary | ICD-10-CM

## 2023-01-19 NOTE — Patient Instructions (Signed)
Medication Instructions:   DECREASE METOPROLOL TO 25 MG ONCE DAILY FOR 2 DAYS AND THEN STOP  *If you need a refill on your cardiac medications before your next appointment, please call your pharmacy*   Follow-Up: At [facility], you and your health needs are our priority.  As part of our continuing mission to provide you with exceptional heart care, we have created designated Provider Care Teams.  These Care Teams include your primary Cardiologist (physician) and Advanced Practice Providers (APPs -  Physician Assistants and Nurse Practitioners) who all work together to provide you with the care you need, when you need it.  We recommend signing up for the patient portal called "MyChart".  Sign up information is provided on this After Visit Summary.  MyChart is used to connect with patients for Virtual Visits (Telemedicine).  Patients are able to view lab/test results, encounter notes, upcoming appointments, etc.  Non-urgent messages can be sent to your provider as well.   To learn more about what you can do with MyChart, go to [email or web address].    Your next appointment:   3 month(s)  Provider:   Olga Millers, MD

## 2023-01-20 ENCOUNTER — Other Ambulatory Visit (INDEPENDENT_AMBULATORY_CARE_PROVIDER_SITE_OTHER): Payer: Medicare PPO

## 2023-01-20 ENCOUNTER — Encounter: Payer: Self-pay | Admitting: Neurology

## 2023-01-20 ENCOUNTER — Ambulatory Visit: Payer: Medicare PPO | Admitting: Neurology

## 2023-01-20 VITALS — BP 126/80 | HR 60 | Ht 75.0 in

## 2023-01-20 DIAGNOSIS — G609 Hereditary and idiopathic neuropathy, unspecified: Secondary | ICD-10-CM

## 2023-01-20 DIAGNOSIS — G901 Familial dysautonomia [Riley-Day]: Secondary | ICD-10-CM

## 2023-01-20 DIAGNOSIS — G43009 Migraine without aura, not intractable, without status migrainosus: Secondary | ICD-10-CM

## 2023-01-20 DIAGNOSIS — G2111 Neuroleptic induced parkinsonism: Secondary | ICD-10-CM | POA: Diagnosis not present

## 2023-01-20 DIAGNOSIS — T43505A Adverse effect of unspecified antipsychotics and neuroleptics, initial encounter: Secondary | ICD-10-CM

## 2023-01-20 LAB — VITAMIN B12: Vitamin B-12: 999 pg/mL — ABNORMAL HIGH (ref 211–911)

## 2023-01-20 NOTE — Patient Instructions (Addendum)
Check ganglionic AChR antibody, B12, ACE, IFE, SSA/SSB antibodies Emgality Ubrelvy as needed

## 2023-01-21 LAB — SJOGREN'S SYNDROME ANTIBODS(SSA + SSB)
SSA (Ro) (ENA) Antibody, IgG: <1 AI
SSB (La) (ENA) Antibody, IgG: <1 AI

## 2023-01-21 LAB — ANGIOTENSIN CONVERTING ENZYME: Angiotensin-Converting Enzyme: 39 U/L (ref 9–67)

## 2023-01-21 LAB — IMMUNOFIXATION, SERUM
IgG (Immunoglobin G), Serum: 812 mg/dL (ref 603–1613)
IgM (Immunoglobulin M), Srm: 90 mg/dL (ref 20–172)

## 2023-01-24 ENCOUNTER — Other Ambulatory Visit: Payer: Self-pay | Admitting: Neurology

## 2023-01-24 DIAGNOSIS — G43009 Migraine without aura, not intractable, without status migrainosus: Secondary | ICD-10-CM

## 2023-01-24 NOTE — Telephone Encounter (Signed)
PA has been APPROVED from 01/13/2023-09/05/2023

## 2023-01-27 ENCOUNTER — Other Ambulatory Visit (HOSPITAL_COMMUNITY): Payer: Self-pay

## 2023-01-28 ENCOUNTER — Other Ambulatory Visit (HOSPITAL_COMMUNITY): Payer: Self-pay

## 2023-01-28 LAB — IMMUNOFIXATION, SERUM: IgA/Immunoglobulin A, Serum: 187 mg/dL (ref 61–437)

## 2023-02-03 NOTE — Progress Notes (Signed)
Labs, will ask [facility]

## 2023-02-04 ENCOUNTER — Telehealth: Payer: Self-pay | Admitting: Pharmacy Technician

## 2023-02-04 ENCOUNTER — Other Ambulatory Visit (HOSPITAL_COMMUNITY): Payer: Self-pay

## 2023-02-04 NOTE — Telephone Encounter (Signed)
Patient Advocate Encounter  Received notification from [facility] that prior authorization for UBRELVY 100MG  is required.   PA submitted on 5.31.24 Key [id number] Status is pending

## 2023-02-07 ENCOUNTER — Telehealth: Payer: Self-pay | Admitting: Neurology

## 2023-02-07 NOTE — Telephone Encounter (Signed)
Patient wife, His symptoms are not consistent with myasthenia gravis.  I would like to see what the neuropathy test shows.  If he is having an acute decline, he may need to be seen in the ED or contact his PCP as well.      Wife verbalized understanding.

## 2023-02-07 NOTE — Telephone Encounter (Signed)
LMOVM pleae call the office.

## 2023-02-07 NOTE — Telephone Encounter (Signed)
Pt's wife called in and left a message with the access nurse on 02/05/23 at 3:30 PM. Caller States her husband is very weak even with his walker and cane. He can barely get to the bathroom alone. States he's also having memory issue and would like to know what should they do. Has been seeing a provider for these issues but has not been dx with anything. Last 2 days his symptoms have worsened neurologic wise. Blood pressure has been fluctuating a lot, has not checked BP today has a pacemaker, has been interrogated and everything is fine. Currently he has fell twice today no injuries, sitting in a chair at this time. Having trouble swallowing, has been getting botox to help with swallowing issues. Patient stated his legs just feel weak and want to give out.   Full access nurse report is in Dr. Moises Blood box

## 2023-02-07 NOTE — Telephone Encounter (Signed)
Per patient wife, Patient has gotten weaker since Saturday, Patient seeing double vision,Speech is off, Patient is confusion. Patient wife wanted to know if it will be okay to start the patient on medication for Myasthenia gravis while waiting on the labs to come. Bp Is high. 150's over 100-110's. Having to take his PRN Hydralazine more.  Advised wife to please advised the Cardiologist of the increase in PRN meds taken due to increase BP.

## 2023-02-07 NOTE — Telephone Encounter (Signed)
Pt needs to speak with the Nurse about her husband   He is falling and having back pain. He is also having trouble getting around and shaking. He is really declining in the last 72 hours

## 2023-02-11 ENCOUNTER — Telehealth: Payer: Self-pay

## 2023-02-11 NOTE — Telephone Encounter (Signed)
Patient wife advised  

## 2023-02-11 NOTE — Telephone Encounter (Signed)
Patient Advocate Encounter  Prior Authorization for Bernita Raisin 100MG  tablets has been approved through [place].    Key: [id number]  Effective: 02-11-2023 to 09-06-2023

## 2023-02-11 NOTE — Telephone Encounter (Signed)
Per Patient wife the patient will have his labs drawn for [facility] Monday.

## 2023-02-14 NOTE — Progress Notes (Signed)
Remote pacemaker transmission.   

## 2023-02-23 ENCOUNTER — Other Ambulatory Visit (HOSPITAL_COMMUNITY): Payer: Self-pay

## 2023-02-23 ENCOUNTER — Other Ambulatory Visit: Payer: Self-pay | Admitting: Neurology

## 2023-02-23 ENCOUNTER — Other Ambulatory Visit: Payer: Self-pay

## 2023-02-23 DIAGNOSIS — G43009 Migraine without aura, not intractable, without status migrainosus: Secondary | ICD-10-CM

## 2023-02-23 MED ORDER — UBRELVY 100 MG PO TABS
1.0000 | ORAL_TABLET | ORAL | 11 refills | Status: DC | PRN
  Filled 2023-02-23: qty 16, 30d supply, fill #0
  Filled 2023-03-31: qty 16, 30d supply, fill #1
  Filled 2023-04-21: qty 16, 30d supply, fill #2
  Filled 2023-06-14: qty 16, 30d supply, fill #3
  Filled 2023-06-22 – 2023-07-13 (×2): qty 16, 30d supply, fill #4
  Filled 2023-07-28 – 2023-08-05 (×2): qty 16, 30d supply, fill #5
  Filled 2023-08-25: qty 16, 30d supply, fill #6

## 2023-02-24 ENCOUNTER — Other Ambulatory Visit (HOSPITAL_COMMUNITY): Payer: Self-pay

## 2023-02-28 ENCOUNTER — Telehealth: Payer: Self-pay | Admitting: Neurology

## 2023-02-28 DIAGNOSIS — R531 Weakness: Secondary | ICD-10-CM

## 2023-02-28 DIAGNOSIS — G2111 Neuroleptic induced parkinsonism: Secondary | ICD-10-CM

## 2023-02-28 DIAGNOSIS — I951 Orthostatic hypotension: Secondary | ICD-10-CM

## 2023-02-28 DIAGNOSIS — G20C Parkinsonism, unspecified: Secondary | ICD-10-CM

## 2023-02-28 DIAGNOSIS — R200 Anesthesia of skin: Secondary | ICD-10-CM

## 2023-02-28 DIAGNOSIS — R251 Tremor, unspecified: Secondary | ICD-10-CM

## 2023-02-28 NOTE — Telephone Encounter (Signed)
Called patients wife and informed her that Dr. Everlena Cooper would like to refer patient to the [facility] at [facility]. Patiens wife is agreeable and aware I will fax the referral for patient.

## 2023-02-28 NOTE — Telephone Encounter (Signed)
Pts spouse is calling in to see if they could get the lab results from last week she is aware that someone from the office will give her a call once they have been results from the provider.

## 2023-02-28 NOTE — Addendum Note (Signed)
Addended by: Karl Luke A on: 02/28/2023 02:34 PM   Modules accepted: Orders

## 2023-02-28 NOTE — Telephone Encounter (Signed)
Referral created and sent 

## 2023-02-28 NOTE — Telephone Encounter (Signed)
Called and spoke to patients wife and informed her that per Dr. Everlena Cooper the send out lab results came back Friday.  It is negative.  Unfortunately, I do not have an answer.   Patients wife wanted to know if she can get a referral sent out for a second opinion? I informed her I would ask Dr. Everlena Cooper is that is something he can do and let her know.

## 2023-03-04 ENCOUNTER — Telehealth: Payer: Self-pay | Admitting: Neurology

## 2023-03-04 NOTE — Telephone Encounter (Signed)
Patient wife given Movement Disorder Clinic at [facility]  [phone])

## 2023-03-04 NOTE — Telephone Encounter (Signed)
Pt wife left message on the VM needing to speak with Antonio Oliver about a referral

## 2023-03-07 ENCOUNTER — Telehealth: Payer: Self-pay | Admitting: Neurology

## 2023-03-07 DIAGNOSIS — G20C Parkinsonism, unspecified: Secondary | ICD-10-CM

## 2023-03-07 DIAGNOSIS — G2111 Neuroleptic induced parkinsonism: Secondary | ICD-10-CM

## 2023-03-07 DIAGNOSIS — R531 Weakness: Secondary | ICD-10-CM

## 2023-03-07 DIAGNOSIS — R251 Tremor, unspecified: Secondary | ICD-10-CM

## 2023-03-07 NOTE — Telephone Encounter (Signed)
Pt wife called back and states that [facility]  can't get them in for a couple of months and they wanted to to know we will still call [facility] to get the referral started over there and also send one to [facility] at  fax number [phone] to see where he can get in sooner. She states that patient is going down hill really fast and really needs to be seen ASAP

## 2023-03-07 NOTE — Telephone Encounter (Signed)
Pt wife called and states that she found the phone number and called [facility] and they are a week behind on Faxed referrals and she would like [place] to call the referral in so that they can get him in as soon as possible. If we call in the referral it will be entered while on the phone with [place]

## 2023-03-07 NOTE — Telephone Encounter (Signed)
Needs the telephone to the movement disorder clinic in [place]

## 2023-03-14 ENCOUNTER — Telehealth: Payer: Self-pay | Admitting: Neurology

## 2023-03-14 NOTE — Telephone Encounter (Signed)
Patients wife barely missed call. Would like call back /KB

## 2023-03-14 NOTE — Telephone Encounter (Signed)
See other encounter 03/14/23

## 2023-03-14 NOTE — Telephone Encounter (Signed)
Patient's wife is calling to get the date of the referral that was suppose to be sent to [facility]

## 2023-03-14 NOTE — Telephone Encounter (Signed)
New Referral sent Via Epic and Fax machine. URGENT.

## 2023-03-31 ENCOUNTER — Other Ambulatory Visit (HOSPITAL_COMMUNITY): Payer: Self-pay

## 2023-04-01 NOTE — Progress Notes (Signed)
HPI: Follow-up history of aortic valve replacement and thoracic aortic aneurysm. Patient has a history of bicuspid aortic valve and underwent aortic valve replacement March 10, 2017.  Procedure was complicated by complete heart block requiring pacemaker placement.  Note preoperative cardiac catheterization revealed no coronary disease. Nuclear study August 2022 showed no ischemia. CTA April 2023 showed 4.3 cm ascending aorta.  Renal Dopplers May 2023 showed 1 to 59% right and no stenosis on the left.  Carotid Dopplers March 2024 near normal on the right and left.  Echocardiogram March 2024 showed normal LV function, mild left ventricular hypertrophy, grade 2 diastolic dysfunction, severe biatrial enlargement, moderate to severe mitral regurgitation, status post AVR with mean gradient 9 mmHg, no aortic insufficiency and mildly dilated ascending aorta at 44 mm.  CTA April 2024 showed 4.1 cm ascending thoracic aortic aneurysm and small bilateral pleural effusions.  Since last seen he denies dyspnea, chest pain, palpitations or syncope.  He continues to have occasional blood pressure spikes but does not require hydralazine as needed as often.  He continues to have difficulties with muscular weakness and is scheduled to see neurology at [facility] on August 30.  Current Outpatient Medications  Medication Sig Dispense Refill   aspirin EC 81 MG tablet Take 81 mg by mouth daily.     atorvastatin (LIPITOR) 20 MG tablet Take 20 mg by mouth daily at 6 PM.      CALCIUM CITRATE PO Take 500 mg by mouth 2 (two) times daily.     Cholecalciferol (VITAMIN D-3 PO) Take 1 capsule by mouth daily.     clobetasol (TEMOVATE) 0.05 % external solution Apply topically.     clonazePAM (KLONOPIN) 0.5 MG tablet Take 0.5 mg by mouth 2 (two) times daily as needed for anxiety.     diclofenac Sodium (VOLTAREN) 1 % GEL Apply 1 application topically 4 (four) times daily.     docusate sodium (COLACE) 100 MG capsule Take 1 capsule (100 mg  total) by mouth 2 (two) times daily. 10 capsule 0   DULoxetine (CYMBALTA) 60 MG capsule Take 60 mg by mouth 2 (two) times daily.      ferrous sulfate 325 (65 FE) MG EC tablet Take 325 mg by mouth daily.     finasteride (PROSCAR) 5 MG tablet Take 5 mg by mouth daily.     fluticasone (FLONASE) 50 MCG/ACT nasal spray Place 2 sprays into the nose daily as needed for allergies or rhinitis.     folic acid (FOLVITE) 1 MG tablet Take 1 mg by mouth daily.     furosemide (LASIX) 20 MG tablet Take 1 tablet (20 mg total) by mouth as needed (for leg swelling). 30 tablet 4   Galcanezumab-gnlm (EMGALITY) 120 MG/ML SOAJ INJECT 120MG (1 ML) SUBCUTANEOUSLY EVERY MONTH 1 mL 11   hydrALAZINE (APRESOLINE) 10 MG tablet Take 1 tablet three times daily for systolic bp > 140 or diastolic bp > 90 (Patient taking differently: 25 mg. Take 1 tablet three times daily for systolic bp > 140 or diastolic bp > 90) 270 tablet 3   levothyroxine (SYNTHROID) 125 MCG tablet Take 125 mcg by mouth daily.      lidocaine (LIDODERM) 5 % SMARTSIG:Topical     loratadine (CLARITIN) 10 MG tablet Take 10 mg by mouth daily as needed for allergies.     MAGNESIUM PO Take 1 tablet by mouth daily.     meclizine (ANTIVERT) 25 MG tablet Take 25 mg by mouth 3 (three) times daily  as needed for dizziness.     metFORMIN (GLUCOPHAGE) 1000 MG tablet Take 1,000 mg by mouth 2 (two) times daily with a meal.     Multiple Vitamins-Minerals (MULTIVITAMIN WITH MINERALS) tablet Take 1 tablet by mouth daily.     Multiple Vitamins-Minerals (PRESERVISION/LUTEIN) CAPS Take 1 capsule by mouth at bedtime.     mupirocin ointment (BACTROBAN) 2 % Apply 1 application topically 3 (three) times daily as needed (infection).     naloxone (NARCAN) nasal spray 4 mg/0.1 mL Instill or spray into the nose if you are experiencing shortness of breath or signs of opioid overdose. 1 each 0   omeprazole (PRILOSEC) 20 MG capsule Take 20 mg by mouth 2 (two) times daily before a meal.       oxyCODONE-acetaminophen (PERCOCET) 10-325 MG tablet Take 0.5 tablets by mouth See admin instructions. 1/2 tablet every 3 hours.     polyethylene glycol (MIRALAX / GLYCOLAX) 17 g packet Take 17 g by mouth daily as needed.     potassium chloride (KLOR-CON) 10 MEQ tablet Take 10 mEq by mouth as needed (with lasix dose).     pregabalin (LYRICA) 100 MG capsule Take 100 mg by mouth 3 (three) times daily.     Semaglutide,0.25 or 0.5MG /DOS, (OZEMPIC, 0.25 OR 0.5 MG/DOSE,) 2 MG/3ML SOPN Inject 0.5 mg into the skin once a week. 27 mL 3   silodosin (RAPAFLO) 8 MG CAPS capsule Take 8 mg by mouth daily.     solifenacin (VESICARE) 10 MG tablet Take 10 mg by mouth at bedtime.     tiZANidine (ZANAFLEX) 4 MG tablet Take 4 mg by mouth 3 (three) times daily.     Ubrogepant (UBRELVY) 100 MG TABS Take 1 tablet (100 mg total) by mouth as needed (May repeat in 2 hours.  Maximum 2 tablets in 24 hours.). 16 tablet 11   No current facility-administered medications for this visit.     Past Medical History:  Diagnosis Date   Anxiety and depression    Ascending aorta dilation (HCC) 03/06/2019   4.3 cm 2022   Benign prostatic hyperplasia without lower urinary tract symptoms 11/17/2016   Last Assessment & Plan:  Stable PSA today.  Formatting of this note might be different from the original. Last Assessment & Plan:  Stable PSA today. Last Assessment & Plan:  Formatting of this note might be different from the original. Stable PSA today.   BPH (benign prostatic hyperplasia)    Bradycardia 02/01/2019   Cervical myelopathy (HCC) 11/14/2018   Charcot's joint of foot 03/05/2019   Chronic back pain 04/03/2013   Chronic inflammatory arthritis 11/17/2016   History of positive rheumatoid factor in the past. Denies placement on immunosuppressive therapy   Chronic pain syndrome    Failed back surgical syndrome.  Chronic neck pain.  Chronic low back pain with sciatica bilateral   Colon cancer screening    2023 cologuard neg    Complete heart block (HCC)    Following AVR s/p [employer] PPM in 03/2017   DM2 (diabetes mellitus, type 2) (HCC) 04/03/2013   Last Assessment & Plan:  Formatting of this note might be different from the original. Diabetes is unchanged.  Continue current treatment regimen. Regular aerobic exercise. Diabetes will be reassessed in 3 months. Will check A1c today. Denies any problems with feet or sensation.   Elevated PSA 11/2021   Enthesopathy of ankle and tarsus 04/04/2013   Last Assessment & Plan:  Continues with swelling after prolonged standing.  Still  Wearing support hose, which helps.   Essential hypertension 10/30/2018   FHx: migraine headaches 04/04/2013   Fracture of capitate bone of wrist 02/26/2019   Fracture of tibia 03/05/2019   GERD (gastroesophageal reflux disease) 10/30/2018   Glaucoma 04/03/2013   Hemangioma of skin and subcutaneous tissue 07/07/2017   History of aortic valve replacement 2018   bicuspid AV   History of kidney stones 11/17/2016   History of sleep apnea    wt loss->gone   Hypothyroidism 10/30/2018   IDA (iron deficiency anemia) 11/2021   malabs d/t gastric bipass   Lesion of plantar nerve 04/04/2013   Lumbar stenosis with neurogenic claudication 01/08/2021   Major depressive disorder, recurrent (HCC)    ECT in the remote past.  Patient disabled due to recurrent depression and chronic pain.   Migraine syndrome    Mixed dyslipidemia 10/30/2018   Morbid obesity (HCC) 10/27/2011   Nondisplaced fracture of medial cuneiform of left foot, initial encounter for closed fracture 11/16/2018   Osteoarthritis 10/30/2018   Personal history of tobacco use, presenting hazards to health 04/04/2013   Presence of permanent cardiac pacemaker    Severe single current episode of major depressive disorder, without psychotic features (HCC) 11/17/2016   Status post bariatric surgery 04/05/2011   bipass surg --presurg wt 310 lbs   Syncope 02/14/2019   Vitamin D deficiency  10/30/2018    Past Surgical History:  Procedure Laterality Date   ANTERIOR CERVICAL DECOMP/DISCECTOMY FUSION N/A 11/17/2018   Procedure: CERVICAL THREE-CERVICAL FOUR, CERVICAL FOUR-CERVICAL FIVE, CERVICAL FIVE-CERVICAL SIX ANTERIOR CERVICAL DECOMPRESSION/DISCECTOMY FUSION;  Surgeon: Julio Sicks, MD;  Location: [facility] OR;  Service: Neurosurgery;  Laterality: N/A;   AORTIC VALVE REPLACEMENT  03/2017   APPLICATION OF WOUND VAC Left 04/05/2019   Procedure: Application Of Wound Vac;  Surgeon: Terance Hart, MD;  Location: [facility] OR;  Service: Orthopedics;  Laterality: Left;   BACK SURGERY     x4   CARDIOVASCULAR STRESS TEST  05/05/2021   lexiscan neg   FASCIOTOMY Left 04/05/2019   Left leg 2 compartment fasciotomy   FASCIOTOMY Left 04/05/2019   Procedure: FASCIOTOMY LEFT LOWER LEG;  Surgeon: Terance Hart, MD;  Location: [facility] OR;  Service: Orthopedics;  Laterality: Left;   FOOT NEUROMA SURGERY     GASTRIC BYPASS  2012   HEMATOMA EVACUATION Left 04/05/2019   Procedure: Evacuation Hematoma Left Lower Leg;  Surgeon: Terance Hart, MD;  Location: [facility] OR;  Service: Orthopedics;  Laterality: Left;   HEMORRHOID SURGERY     over 30 years ago   HERNIA REPAIR     LIH umb   I & D EXTREMITY Left 04/13/2022   Procedure: IRRIGATION AND DEBRIDEMENT OF ELBOW AND BURSECTOMY;  Surgeon: Bradly Bienenstock, MD;  Location: [facility] OR;  Service: Orthopedics;  Laterality: Left;   LUMBAR LAMINECTOMY/DECOMPRESSION MICRODISCECTOMY N/A 01/08/2021   Procedure: CENTRAL LUMBAR LAMINECTOMY LUMBAR TWO-THREE, LUMBAR THREE-FOUR;  Surgeon: Kerrin Champagne, MD;  Location: [facility] OR;  Service: Orthopedics;  Laterality: N/A;   NASAL SINUS SURGERY     x2   NECK SURGERY     OLECRANON BURSECTOMY  2023   d/t septic bursitis   TRANSTHORACIC ECHOCARDIOGRAM  12/2020   EF normal, mod MR, ascending AA (4.4 cm)    Social History   Socioeconomic History   Marital status: Married    Spouse name: susan    Number of children: 1    Years of education: Not on file   Highest education level: Bachelor's degree (e.g.,  BA, AB, BS)  Occupational History   Not on file  Tobacco Use   Smoking status: Never   Smokeless tobacco: Never  Vaping Use   Vaping status: Never Used  Substance and Sexual Activity   Alcohol use: Not Currently    Alcohol/week: 1.0 standard drink of alcohol    Types: 1 Glasses of wine per week    Comment: occ.   Drug use: No   Sexual activity: Not on file  Other Topics Concern   Not on file  Social History Narrative   Patient lives at home with spouse Darl Pikes   Educ: BS   Occup: retired Banker.  Disabled due to depression and pain.   Tobacco: None   Alc: none   Right handed   Social Determinants of Health   Financial Resource Strain: Low Risk  (05/30/2019)   Received from [facility], [facility]   Overall Financial Resource Strain (CARDIA)    Difficulty of Paying Living Expenses: Not hard at all  Food Insecurity: No Food Insecurity (05/30/2019)   Received from [facility], [facility]   Hunger Vital Sign    Worried About Running Out of Food in the Last Year: Never true    Ran Out of Food in the Last Year: Never true  Transportation Needs: Unmet Transportation Needs (05/30/2019)   Received from [facility], [facility]   PRAPARE - Transportation    Lack of Transportation (Medical): Yes    Lack of Transportation (Non-Medical): Yes  Physical Activity: Not on file  Stress: Not on file  Social Connections: Unknown (01/06/2023)   Received from [facility]   Social Network    Social Network: Not on file  Intimate Partner Violence: Unknown (01/06/2023)   Received from [facility]   HITS    Physically Hurt: Not on file    Insult or Talk Down To: Not on file    Threaten Physical Harm: Not on file    Scream or Curse: Not on file    Family History  Problem Relation Age of Onset   Cancer Mother        breast   Rheum arthritis Mother    Diabetes Mother     Heart disease Father    Kidney disease Father    Diabetes Father    Hypertension Brother    Diabetes Brother    Osteoarthritis Brother    Heart disease Brother    Hypertension Brother    Diabetes Brother    Rheum arthritis Brother    Depression Daughter    Anxiety disorder Daughter     ROS: no fevers or chills, productive cough, hemoptysis, dysphasia, odynophagia, melena, hematochezia, dysuria, hematuria, rash, seizure activity, orthopnea, PND, pedal edema, claudication. Remaining systems are negative.  Physical Exam: Well-developed well-nourished in no acute distress.  Skin is warm and dry.  HEENT is normal.  Neck is supple.  Chest is clear to auscultation with normal expansion.  Cardiovascular exam is regular rate and rhythm.  Abdominal exam nontender or distended. No masses palpated. Extremities show no edema. neuro diffuse muscular weakness.   A/P  1 status post AVR-continue SBE prophylaxis.  2 mitral regurgitation-moderate to severe on most recent echocardiogram.  Plan is to proceed with follow-up study March 2025.  3 hypertension-patient continues to have difficulties with hypotension and weakness.  Previous renal Doppler showed no renal artery stenosis.  His blood pressure is continuing to run low off of medications.  He is being seen by neurology and  workup continues to be in progress.  4 thoracic aortic aneurysm-follow-up CTA April 2025.  5 pacemaker-Per EP.  6 hyperlipidemia-continue statin.  7 palpitations-beta-blocker discontinued previously due to hypotension.  8 weakness/dysautonomia-continues to have extreme difficulties with this.  He is being referred to [facility] for further assessment.  Olga Millers, MD

## 2023-04-13 ENCOUNTER — Ambulatory Visit: Payer: Medicare PPO | Admitting: Cardiology

## 2023-04-13 ENCOUNTER — Encounter: Payer: Self-pay | Admitting: Cardiology

## 2023-04-13 VITALS — BP 127/81 | HR 64 | Ht 75.0 in | Wt 189.1 lb

## 2023-04-13 DIAGNOSIS — I7121 Aneurysm of the ascending aorta, without rupture: Secondary | ICD-10-CM

## 2023-04-13 DIAGNOSIS — Z952 Presence of prosthetic heart valve: Secondary | ICD-10-CM

## 2023-04-13 DIAGNOSIS — E785 Hyperlipidemia, unspecified: Secondary | ICD-10-CM | POA: Diagnosis not present

## 2023-04-13 DIAGNOSIS — I10 Essential (primary) hypertension: Secondary | ICD-10-CM

## 2023-04-13 DIAGNOSIS — I340 Nonrheumatic mitral (valve) insufficiency: Secondary | ICD-10-CM

## 2023-04-13 NOTE — Patient Instructions (Signed)

## 2023-04-18 ENCOUNTER — Ambulatory Visit: Payer: Medicare PPO

## 2023-04-18 ENCOUNTER — Other Ambulatory Visit (HOSPITAL_COMMUNITY): Payer: Self-pay

## 2023-04-18 DIAGNOSIS — I442 Atrioventricular block, complete: Secondary | ICD-10-CM

## 2023-04-20 ENCOUNTER — Other Ambulatory Visit (HOSPITAL_COMMUNITY): Payer: Self-pay

## 2023-04-21 ENCOUNTER — Other Ambulatory Visit (HOSPITAL_COMMUNITY): Payer: Self-pay

## 2023-04-22 ENCOUNTER — Other Ambulatory Visit (HOSPITAL_COMMUNITY): Payer: Self-pay

## 2023-04-25 ENCOUNTER — Other Ambulatory Visit (HOSPITAL_COMMUNITY): Payer: Self-pay

## 2023-04-25 ENCOUNTER — Ambulatory Visit: Payer: Medicare PPO | Admitting: Neurology

## 2023-04-27 ENCOUNTER — Encounter: Payer: Medicare PPO | Attending: Physical Medicine & Rehabilitation | Admitting: Physical Medicine & Rehabilitation

## 2023-04-27 ENCOUNTER — Encounter: Payer: Self-pay | Admitting: Physical Medicine & Rehabilitation

## 2023-04-27 VITALS — BP 128/83 | Ht 75.0 in | Wt 193.4 lb

## 2023-04-27 DIAGNOSIS — G243 Spasmodic torticollis: Secondary | ICD-10-CM | POA: Insufficient documentation

## 2023-04-27 MED ORDER — SODIUM CHLORIDE (PF) 0.9 % IJ SOLN
1.6000 mL | Freq: Once | INTRAMUSCULAR | Status: AC
Start: 2023-04-27 — End: 2023-04-27
  Administered 2023-04-27: 1.6 mL via INTRAVENOUS

## 2023-04-27 MED ORDER — ABOBOTULINUMTOXINA 300 UNITS IM SOLR
300.0000 [IU] | Freq: Once | INTRAMUSCULAR | Status: AC
Start: 2023-04-27 — End: 2023-04-27
  Administered 2023-04-27: 300 [IU] via INTRAMUSCULAR

## 2023-04-27 MED ORDER — ABOBOTULINUMTOXINA 500 UNITS IM SOLR
500.0000 [IU] | Freq: Once | INTRAMUSCULAR | Status: AC
Start: 2023-04-27 — End: 2023-04-27
  Administered 2023-04-27: 500 [IU] via INTRAMUSCULAR

## 2023-04-27 NOTE — Progress Notes (Signed)
Dysport Injection for spasticity using needle EMG guidance Indication:  Cervical dystonia     Dilution: 500 Units/49ml        Total Units Injected: 800 Indication: Severe spasticity which interferes with ADL,mobility and/or  hygiene and is unresponsive to medication management and other conservative care Informed consent was obtained after describing risks and benefits of the procedure with the patient. This includes bleeding, bruising, infection, excessive weakness, or medication side effects. A REMS form is on file and signed.   Side: right  Needle: 50mm injectable monopolar needle electrode   Number of units per muscle Trapezius 200 units 4 access points Levator scapulae 100 units  2  access points Sternocleidomastoid 500 units 4 access points Scalenes 0 units 0 access points     All injections were done after obtaining appropriate EMG activity and after negative drawback for blood. The patient tolerated the procedure well. Post procedure instructions were given.

## 2023-04-27 NOTE — Patient Instructions (Signed)
ALWAYS FEEL FREE TO CALL OUR OFFICE WITH ANY PROBLEMS OR QUESTIONS ([phone])  **PLEASE NOTE** ALL MEDICATION REFILL REQUESTS (INCLUDING CONTROLLED SUBSTANCES) NEED TO BE MADE AT LEAST 7 DAYS PRIOR TO REFILL BEING DUE. ANY REFILL REQUESTS INSIDE THAT TIME FRAME MAY RESULT IN DELAYS IN RECEIVING YOUR PRESCRIPTION.                    

## 2023-05-03 NOTE — Progress Notes (Signed)
Remote pacemaker transmission.   

## 2023-05-04 ENCOUNTER — Other Ambulatory Visit (HOSPITAL_COMMUNITY): Payer: Self-pay

## 2023-05-06 ENCOUNTER — Other Ambulatory Visit (HOSPITAL_COMMUNITY): Payer: Self-pay

## 2023-05-06 MED FILL — Galcanezumab-gnlm Subcutaneous Soln Auto-Injector 120 MG/ML: SUBCUTANEOUS | 30 days supply | Qty: 1 | Fill #0 | Status: AC

## 2023-05-10 ENCOUNTER — Telehealth: Payer: Self-pay | Admitting: *Deleted

## 2023-05-10 NOTE — Telephone Encounter (Signed)
Mrs Antonio Oliver called and reports Antonio Oliver is having progressively worsening difficulty with swallowing. He is having problems with liquids.

## 2023-05-10 NOTE — Telephone Encounter (Signed)
I called and spoke to Mrs. Claudette Laws. He is still able to swallow solids and liquids, it is just more difficult. He has had good results with the injections fortunately from a standpoint of his dystonia.   He is about 2 weeks out from injection now. He should experience peak effect over the next 1-2 weeks.   We may need to use a little less medication with next injection as he had dysphagia after the prior injections, just not as much.

## 2023-05-23 ENCOUNTER — Other Ambulatory Visit (HOSPITAL_COMMUNITY): Payer: Self-pay

## 2023-05-23 MED ORDER — OZEMPIC (0.25 OR 0.5 MG/DOSE) 2 MG/3ML ~~LOC~~ SOPN
0.5000 mg | PEN_INJECTOR | SUBCUTANEOUS | 3 refills | Status: DC
  Filled 2023-05-23: qty 3, 28d supply, fill #0
  Filled 2023-06-14 – 2023-06-16 (×2): qty 3, 28d supply, fill #1
  Filled 2023-06-22 – 2023-07-11 (×2): qty 3, 28d supply, fill #2
  Filled 2023-07-28 – 2023-08-05 (×2): qty 3, 28d supply, fill #3

## 2023-05-24 NOTE — Progress Notes (Unsigned)
NEUROLOGY FOLLOW UP OFFICE NOTE  Antonio Oliver [id number]  Assessment/Plan:   Dysautonomia/orthostatic hypotension.  Testing points against parkinsonian syndrome such as Multiple System Atrophy.  Would evaluate for an autoimmune autonomic ganglionopathy. 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   Testing is unremarkable for idiopathic/neurodegenerative parkinsonism (Parkinson's disease or Multiple system atrophy).  I explained that he must be off of Rixulti for 6 months before determining if symptoms may be related to another etiology.   Sensorimotor axonal polyneuropathy - he does have diabetes, which can explain etiology.  Would still evaluate for alternative etiologies. Migraine without aura, without status migrainosus, not intractable - increased frequency likely aggravated by his other medical comorbidities.     To evaluate for autoimmune autonomic ganglionopathy, will check serum for anti-ganglionic AChR antibodies, B12, ACE, IFE, SSA/SSB antibodies . Continue Bernita Raisin and Emgality for migraines Off Rixulti.  Would need to re-evaluate in several months.. Follow up in 4 months.  90 minutes spent reviewing notes, reviewing MRI of brain, and face to face with patient and his wife discussing results, differential diagnosis and plan in this complex patient.    Subjective:  Antonio Oliver is a 67 year old male with complete heart block s/p PPM, s/p aortic valve repair, positive RF (rheumatoid arthritis not suspected), diabetes, depression/anxiety, chronic back pain and cervical dystonia with history of cervical myelopathy who follows up for continued physical decline.  He is accompanied by his wife who supplements history.   UPDATE: Checked labs in May.  Autoimmune autonomic ganglionopathy panel was negative.  Further labs for causes of neuropathy were checked, including B12 999, ACE 39, normal IFE and negative SSA/SSB antibodies.    He was referred to  the movement disorder clinic at [facility] where he was evaluated by Dr. Chales Abrahams Thenganatt.  She did not appreciate any clear signs of parkinsonism on her exam.  She ordered complete paraneoplastic panel which was negative.  He was referred to Dr. Nadara Mode for autonomic testing at [facility].  ***  HISTORY: Orthostatic hypotension/secondary parkinsonism: He reports multiple episodes of his legs just giving out.  Chronic dizziness for about a year.  He describes spinning.  Sometimes he feels like he is going to pass out when he stands up.  He has orthostatic hypotension at times.  His cardiologist said it is not his heart.  He is wearing compression stockings.  Denies double vision.  Notes occasional tremor in hands.  No history of REM sleep behavior.  Due to pain, he was unable to lay down for DaT scan.  Skin biopsy negative for P-SYN.  He progressively declined.  He is extremely weak and cannot move his legs.  He is experiencing more tremors.  Blood pressure profoundly fluctuates.  Seen in ED on 10/25/2022.  CT head personally reviewed revealed stable arachnoid cyst at the vertex and moderate chronic small vessel ischemic changes but no acute abnormality.  Cardiology does not have an answer.  He has also had progressive weakness.  He has history of cervical stenosis with myelopathy and lumbar laminectomy.  MRI of cervical spine on 06/22/2022 showed stable postoperative changes from C3-6 with chronic myelomalacia at C3-4 level of spinal cord but no acute myelopathy or compressive spinal stenosis.   MRI of lumbar spine showed postoperative changes of decompressive laminectomy at L3-4 and interbody fusion at L4-5 and L5-S1 without any significant spinal canal or neural foraminal stenosis.  Stopped Rixulti in [place] 2024 without improvement in symptoms.    On  exam, he exhibits neuropathy in the feet up to mid-shin, which his wife says has progressed.  In December 2023, his podiatrist checked him and he only  exhibited sensory loss in the feet.  NCV-EMG of left upper extremity and lower limbs on 01/03/2023 revealed evidence of mild to moderate large fiber sensorimotor axonal neuropathy.  Also noted were possible evidence of left ulnar mononeuropathy and left CTS which may be affected by low temperature.  In March 2024, he endorsed left facial numbness.  MRI of brain with and without contrast on 01/21/2023 revealed chronic small vessel ischemic changes, incidental small left vertex arachnoid cyst but no acute intracranial abnormalities.   Migraines: Onset:  1980s Location:  Usually bifrontal Quality:  Squeezing, throbbing, pounding Initial Intensity:  7-8/10.  He denies new headache, thunderclap headache or severe headache that wakes from sleep. Aura:  none Premonitory Phase:  none Postdrome:  none Associated symptoms: Photophobia, phonophobia, blurred vision.  He denies associated nausea, vomiting, unilateral numbness or weakness. Initial Duration:  Usually a day.  Has happened up to 3 days. Initial Frequency:  10 to 15 days a month, started Botox in 2018 and improved once a month. Initial Frequency of abortive medication: 1 a month Triggers:  Unknown Relieving factors:  none Activity:  aggravates   CT head 11/14/18 personally reviewed and was normal. CT cervical spine 11/06/18 personally reviewed and demonstrated multilevel severe facet arthropathy with moderate bilateral neural foraminal stenosis at C3-4 and C4-5 as well as prior interbody fusion at C6-7 without stenosis.   Past NSAIDS:  ibuprofen Past analgesics:  Tylenol, Excedrin, Fioricet, Midrin Past abortive triptans:   Zomig 5mg , Sumatriptan 6mg  [place] Past abortive ergotamine: none Past muscle relaxants:  Zanaflex Past anti-emetic:  none Past antihypertensive medications:  Verapamil, propranolol, clonidine Past antidepressant medications:  amitriptyline Past anticonvulsant medications:  topiramate 200mg  Past anti-CGRP:  Aimovig  140mg  Past vitamins/Herbal/Supplements:  none Past antihistamines/decongestants:  none Other past therapies:  Botox (effective but did not like getting all those injections every 3 months)   Family history of headache:  2 brothers had migraines.      PAST MEDICAL HISTORY: Past Medical History:  Diagnosis Date   Anxiety and depression    Ascending aorta dilation (HCC) 03/06/2019   4.3 cm 2022   Benign prostatic hyperplasia without lower urinary tract symptoms 11/17/2016   Last Assessment & Plan:  Stable PSA today.  Formatting of this note might be different from the original. Last Assessment & Plan:  Stable PSA today. Last Assessment & Plan:  Formatting of this note might be different from the original. Stable PSA today.   BPH (benign prostatic hyperplasia)    Bradycardia 02/01/2019   Cervical myelopathy (HCC) 11/14/2018   Charcot's joint of foot 03/05/2019   Chronic back pain 04/03/2013   Chronic inflammatory arthritis 11/17/2016   History of positive rheumatoid factor in the past. Denies placement on immunosuppressive therapy   Chronic pain syndrome    Failed back surgical syndrome.  Chronic neck pain.  Chronic low back pain with sciatica bilateral   Colon cancer screening    2023 cologuard neg   Complete heart block (HCC)    Following AVR s/p St Jude PPM in 03/2017   DM2 (diabetes mellitus, type 2) (HCC) 04/03/2013   Last Assessment & Plan:  Formatting of this note might be different from the original. Diabetes is unchanged.  Continue current treatment regimen. Regular aerobic exercise. Diabetes will be reassessed in 3 months. Will check A1c today. Denies  any problems with feet or sensation.   Elevated PSA 11/2021   Enthesopathy of ankle and tarsus 04/04/2013   Last Assessment & Plan:  Continues with swelling after prolonged standing.  Still  Wearing support hose, which helps.   Essential hypertension 10/30/2018   FHx: migraine headaches 04/04/2013   Fracture of capitate bone of  wrist 02/26/2019   Fracture of tibia 03/05/2019   GERD (gastroesophageal reflux disease) 10/30/2018   Glaucoma 04/03/2013   Hemangioma of skin and subcutaneous tissue 07/07/2017   History of aortic valve replacement 2018   bicuspid AV   History of kidney stones 11/17/2016   History of sleep apnea    wt loss->gone   Hypothyroidism 10/30/2018   IDA (iron deficiency anemia) 11/2021   malabs d/t gastric bipass   Lesion of plantar nerve 04/04/2013   Lumbar stenosis with neurogenic claudication 01/08/2021   Major depressive disorder, recurrent (HCC)    ECT in the remote past.  Patient disabled due to recurrent depression and chronic pain.   Migraine syndrome    Mixed dyslipidemia 10/30/2018   Morbid obesity (HCC) 10/27/2011   Nondisplaced fracture of medial cuneiform of left foot, initial encounter for closed fracture 11/16/2018   Osteoarthritis 10/30/2018   Personal history of tobacco use, presenting hazards to health 04/04/2013   Presence of permanent cardiac pacemaker    Severe single current episode of major depressive disorder, without psychotic features (HCC) 11/17/2016   Status post bariatric surgery 04/05/2011   bipass surg --presurg wt 310 lbs   Syncope 02/14/2019   Vitamin D deficiency 10/30/2018    MEDICATIONS: Current Outpatient Medications on File Prior to Visit  Medication Sig Dispense Refill   aspirin EC 81 MG tablet Take 81 mg by mouth daily.     atorvastatin (LIPITOR) 20 MG tablet Take 20 mg by mouth daily at 6 PM.      CALCIUM CITRATE PO Take 500 mg by mouth 2 (two) times daily.     Cholecalciferol (VITAMIN D-3 PO) Take 1 capsule by mouth daily.     clobetasol (TEMOVATE) 0.05 % external solution Apply topically.     clonazePAM (KLONOPIN) 0.5 MG tablet Take 0.5 mg by mouth 2 (two) times daily as needed for anxiety.     diclofenac Sodium (VOLTAREN) 1 % GEL Apply 1 application topically 4 (four) times daily.     docusate sodium (COLACE) 100 MG capsule Take 1 capsule  (100 mg total) by mouth 2 (two) times daily. 10 capsule 0   DULoxetine (CYMBALTA) 60 MG capsule Take 60 mg by mouth 2 (two) times daily.      ferrous sulfate 325 (65 FE) MG EC tablet Take 325 mg by mouth daily.     finasteride (PROSCAR) 5 MG tablet Take 5 mg by mouth daily.     fluticasone (FLONASE) 50 MCG/ACT nasal spray Place 2 sprays into the nose daily as needed for allergies or rhinitis.     folic acid (FOLVITE) 1 MG tablet Take 1 mg by mouth daily.     furosemide (LASIX) 20 MG tablet Take 1 tablet (20 mg total) by mouth as needed (for leg swelling). 30 tablet 4   Galcanezumab-gnlm (EMGALITY) 120 MG/ML SOAJ Inject 120 mg into the skin every 30 (thirty) days. 1 mL 11   hydrALAZINE (APRESOLINE) 10 MG tablet Take 1 tablet three times daily for systolic bp > 140 or diastolic bp > 90 (Patient taking differently: 25 mg. Take 1 tablet three times daily for systolic bp > 140  or diastolic bp > 90) 270 tablet 3   levothyroxine (SYNTHROID) 125 MCG tablet Take 125 mcg by mouth daily.      lidocaine (LIDODERM) 5 % SMARTSIG:Topical     loratadine (CLARITIN) 10 MG tablet Take 10 mg by mouth daily as needed for allergies.     MAGNESIUM PO Take 1 tablet by mouth daily.     meclizine (ANTIVERT) 25 MG tablet Take 25 mg by mouth 3 (three) times daily as needed for dizziness.     metFORMIN (GLUCOPHAGE) 1000 MG tablet Take 1,000 mg by mouth 2 (two) times daily with a meal.     Multiple Vitamins-Minerals (MULTIVITAMIN WITH MINERALS) tablet Take 1 tablet by mouth daily.     Multiple Vitamins-Minerals (PRESERVISION/LUTEIN) CAPS Take 1 capsule by mouth at bedtime.     mupirocin ointment (BACTROBAN) 2 % Apply 1 application topically 3 (three) times daily as needed (infection).     naloxone (NARCAN) nasal spray 4 mg/0.1 mL Instill or spray into the nose if you are experiencing shortness of breath or signs of opioid overdose. 1 each 0   omeprazole (PRILOSEC) 20 MG capsule Take 20 mg by mouth 2 (two) times daily before a  meal.      oxyCODONE-acetaminophen (PERCOCET) 10-325 MG tablet Take 0.5 tablets by mouth See admin instructions. 1/2 tablet every 3 hours.     polyethylene glycol (MIRALAX / GLYCOLAX) 17 g packet Take 17 g by mouth daily as needed.     potassium chloride (KLOR-CON) 10 MEQ tablet Take 10 mEq by mouth as needed (with lasix dose).     pregabalin (LYRICA) 100 MG capsule Take 100 mg by mouth 3 (three) times daily.     Semaglutide,0.25 or 0.5MG /DOS, (OZEMPIC, 0.25 OR 0.5 MG/DOSE,) 2 MG/3ML SOPN Inject 0.5 mg into the skin once a week. 3 mL 3   silodosin (RAPAFLO) 8 MG CAPS capsule Take 8 mg by mouth daily.     solifenacin (VESICARE) 10 MG tablet Take 10 mg by mouth at bedtime.     tiZANidine (ZANAFLEX) 4 MG tablet Take 4 mg by mouth 3 (three) times daily.     Ubrogepant (UBRELVY) 100 MG TABS Take 1 tablet (100 mg total) by mouth as needed (May repeat in 2 hours.  Maximum 2 tablets in 24 hours.). 16 tablet 11   No current facility-administered medications on file prior to visit.    ALLERGIES: Allergies  Allergen Reactions   Calcium Carbonate Other (See Comments)    Pt had gastric bypass, can not absorb calcium carbonate - able to absorb calcium citrate   Ms Contin [Morphine] Itching    Can take with Benadryl, facial and nose itching     FAMILY HISTORY: Family History  Problem Relation Age of Onset   Cancer Mother        breast   Rheum arthritis Mother    Diabetes Mother    Heart disease Father    Kidney disease Father    Diabetes Father    Hypertension Brother    Diabetes Brother    Osteoarthritis Brother    Heart disease Brother    Hypertension Brother    Diabetes Brother    Rheum arthritis Brother    Depression Daughter    Anxiety disorder Daughter       Objective:  *** General: No acute distress.  Patient appears well-groomed.   Head:  Normocephalic/atraumatic Eyes:  Fundi examined but not visualized Neck: supple, no paraspinal tenderness, full range of motion Heart:  Regular rate and rhythm Neurological Exam: ***  Shon Millet, DO  CC:  Simone Curia, MD

## 2023-05-26 ENCOUNTER — Encounter: Payer: Self-pay | Admitting: Neurology

## 2023-05-26 ENCOUNTER — Other Ambulatory Visit (HOSPITAL_COMMUNITY): Payer: Self-pay

## 2023-05-26 ENCOUNTER — Ambulatory Visit: Payer: Medicare PPO | Admitting: Neurology

## 2023-05-26 VITALS — BP 104/74 | HR 68 | Ht 75.0 in | Wt 191.0 lb

## 2023-05-26 DIAGNOSIS — G43009 Migraine without aura, not intractable, without status migrainosus: Secondary | ICD-10-CM | POA: Diagnosis not present

## 2023-05-26 DIAGNOSIS — G901 Familial dysautonomia [Riley-Day]: Secondary | ICD-10-CM

## 2023-05-26 MED ORDER — AIMOVIG 140 MG/ML ~~LOC~~ SOAJ
140.0000 mg | SUBCUTANEOUS | 11 refills | Status: DC
Start: 2023-05-26 — End: 2023-08-25
  Filled 2023-05-26 – 2023-06-22 (×5): qty 1, 28d supply, fill #0
  Filled 2023-07-13: qty 1, 28d supply, fill #1
  Filled 2023-07-28 – 2023-08-05 (×2): qty 1, 28d supply, fill #2
  Filled 2023-08-25: qty 1, 28d supply, fill #3

## 2023-05-26 NOTE — Patient Instructions (Signed)
Look into [facility] and [address] for evaluation of dysautonomia Change from [place] to Aimovig injection every 28 days [place] as needed.

## 2023-05-30 ENCOUNTER — Other Ambulatory Visit (HOSPITAL_COMMUNITY): Payer: Self-pay

## 2023-06-01 ENCOUNTER — Other Ambulatory Visit (HOSPITAL_COMMUNITY): Payer: Self-pay

## 2023-06-03 ENCOUNTER — Telehealth: Payer: Self-pay

## 2023-06-03 NOTE — Telephone Encounter (Signed)
PA needed for Aimivig 140 mg

## 2023-06-13 ENCOUNTER — Other Ambulatory Visit (HOSPITAL_COMMUNITY): Payer: Self-pay

## 2023-06-14 ENCOUNTER — Other Ambulatory Visit (HOSPITAL_COMMUNITY): Payer: Self-pay

## 2023-06-14 ENCOUNTER — Other Ambulatory Visit: Payer: Self-pay

## 2023-06-16 ENCOUNTER — Other Ambulatory Visit (HOSPITAL_COMMUNITY): Payer: Self-pay

## 2023-06-16 NOTE — Telephone Encounter (Signed)
Patient hasn't received RX( Aimivig 140mg  ). PA hasn't been sent to the Pharamcy per Patient

## 2023-06-17 ENCOUNTER — Other Ambulatory Visit: Payer: Self-pay

## 2023-06-17 ENCOUNTER — Other Ambulatory Visit (HOSPITAL_COMMUNITY): Payer: Self-pay

## 2023-06-17 MED FILL — Galcanezumab-gnlm Subcutaneous Soln Auto-Injector 120 MG/ML: SUBCUTANEOUS | 30 days supply | Qty: 1 | Fill #1 | Status: CN

## 2023-06-17 NOTE — Telephone Encounter (Signed)
Patient wife, advised of Dr,Jaffe note, Agree with taking the Emgality now.  Follow up on status of PA for Aimovig.  I would like to go ahead and check a CBC and CMP in case we need to start a different medication.    Patient had labs drawn two weeks will have them faxed over for review.

## 2023-06-17 NOTE — Telephone Encounter (Signed)
Waiting on the PA.    Patient wants to know what they should do right now.  Patient having 2-3 headaches a week.     Emaglity at pharmacy will pick up until we get PA for aimovig.

## 2023-06-21 ENCOUNTER — Other Ambulatory Visit (HOSPITAL_COMMUNITY): Payer: Self-pay

## 2023-06-21 ENCOUNTER — Telehealth: Payer: Self-pay | Admitting: Pharmacy Technician

## 2023-06-21 NOTE — Telephone Encounter (Signed)
PA request has been Approved. New Encounter created for follow up. For additional info see Pharmacy Prior Auth telephone encounter from 06/21/2023.

## 2023-06-21 NOTE — Telephone Encounter (Signed)
Pharmacy Patient Advocate Encounter  Received notification from [facility] that Prior Authorization for Aimovig 140MG /ML auto-injectors has been APPROVED from 06/21/2023 to 09/05/2024. Ran test claim, Copay is $0.00. This test claim was processed through [facility]- copay amounts may vary at other pharmacies due to pharmacy/plan contracts, or as the patient moves through the different stages of their insurance plan.   PA #/Case ID/Reference #: [id number] Key: [id number]

## 2023-06-21 NOTE — Telephone Encounter (Signed)
LMOVM and mychart message with approval.

## 2023-06-22 ENCOUNTER — Other Ambulatory Visit (HOSPITAL_COMMUNITY): Payer: Self-pay

## 2023-07-11 ENCOUNTER — Other Ambulatory Visit (HOSPITAL_COMMUNITY): Payer: Self-pay

## 2023-07-13 ENCOUNTER — Other Ambulatory Visit (HOSPITAL_COMMUNITY): Payer: Self-pay

## 2023-07-18 ENCOUNTER — Ambulatory Visit (INDEPENDENT_AMBULATORY_CARE_PROVIDER_SITE_OTHER): Payer: Medicare PPO

## 2023-07-18 DIAGNOSIS — I442 Atrioventricular block, complete: Secondary | ICD-10-CM

## 2023-07-20 LAB — CUP PACEART REMOTE DEVICE CHECK
Battery Remaining Longevity: 61 mo
Battery Remaining Percentage: 47 %
Battery Voltage: 3.01 V
Brady Statistic AP VP Percent: 1 %
Brady Statistic AP VS Percent: 24 %
Brady Statistic AS VP Percent: 2.2 %
Brady Statistic AS VS Percent: 73 %
Brady Statistic RA Percent Paced: 24 %
Brady Statistic RV Percent Paced: 3 %
Date Time Interrogation Session: 20241111033840
Implantable Lead Connection Status: 753985
Implantable Lead Connection Status: 753985
Implantable Lead Implant Date: 20180710
Implantable Lead Implant Date: 20180710
Implantable Lead Location: 753859
Implantable Lead Location: 753860
Implantable Pulse Generator Implant Date: 20180710
Lead Channel Impedance Value: 410 Ohm
Lead Channel Impedance Value: 460 Ohm
Lead Channel Pacing Threshold Amplitude: 0.5 V
Lead Channel Pacing Threshold Amplitude: 0.75 V
Lead Channel Pacing Threshold Pulse Width: 0.4 ms
Lead Channel Pacing Threshold Pulse Width: 0.4 ms
Lead Channel Sensing Intrinsic Amplitude: 1.5 mV
Lead Channel Sensing Intrinsic Amplitude: 2.7 mV
Lead Channel Setting Pacing Amplitude: 1 V
Lead Channel Setting Pacing Amplitude: 2 V
Lead Channel Setting Pacing Pulse Width: 0.4 ms
Lead Channel Setting Sensing Sensitivity: 0.5 mV
Pulse Gen Model: 2272
Pulse Gen Serial Number: 8912213

## 2023-07-28 ENCOUNTER — Other Ambulatory Visit: Payer: Self-pay

## 2023-07-28 ENCOUNTER — Telehealth: Payer: Self-pay | Admitting: Cardiology

## 2023-07-28 ENCOUNTER — Other Ambulatory Visit (HOSPITAL_COMMUNITY): Payer: Self-pay

## 2023-07-28 MED ORDER — AMOXICILLIN 500 MG PO CAPS: 2000.0000 mg | ORAL_CAPSULE | Freq: Once | ORAL | 0 refills | Status: AC

## 2023-07-28 NOTE — Telephone Encounter (Signed)
 Attempted to call patient, no answer left message requesting a call back.

## 2023-07-28 NOTE — Telephone Encounter (Signed)
Pt wife returning call. 

## 2023-07-28 NOTE — Telephone Encounter (Signed)
Patient identification verified by 2 forms. Marilynn Rail, RN    Called and spoke to patients wife Darl Pikes  Informed Darl Pikes:   -Rx sent for Amoxicillin 2000mg    -Take all 4 capsules at once   -should be taken 1hour before dental procedure/cleaning  Darl Pikes has no further questions at this time

## 2023-07-28 NOTE — Telephone Encounter (Signed)
Pt is have a dental cleaning and needs pre-med sent in please call [facility] - [place], [place] - [address] Phone: [phone]  Fax: [phone]

## 2023-08-11 ENCOUNTER — Other Ambulatory Visit (HOSPITAL_COMMUNITY): Payer: Self-pay

## 2023-08-15 NOTE — Progress Notes (Signed)
Remote pacemaker transmission.   

## 2023-08-17 ENCOUNTER — Encounter: Payer: Self-pay | Admitting: Physical Medicine & Rehabilitation

## 2023-08-17 ENCOUNTER — Encounter: Payer: Medicare PPO | Attending: Physical Medicine & Rehabilitation | Admitting: Physical Medicine & Rehabilitation

## 2023-08-17 VITALS — BP 138/90 | HR 92 | Ht 75.0 in | Wt 183.0 lb

## 2023-08-17 DIAGNOSIS — G243 Spasmodic torticollis: Secondary | ICD-10-CM | POA: Insufficient documentation

## 2023-08-17 MED ORDER — ABOBOTULINUMTOXINA 500 UNITS IM SOLR
500.0000 [IU] | Freq: Once | INTRAMUSCULAR | Status: AC
Start: 2023-08-17 — End: 2023-08-17
  Administered 2023-08-17: 500 [IU] via INTRAMUSCULAR

## 2023-08-17 MED ORDER — ABOBOTULINUMTOXINA 300 UNITS IM SOLR
300.0000 [IU] | Freq: Once | INTRAMUSCULAR | Status: AC
Start: 2023-08-17 — End: 2023-08-17
  Administered 2023-08-17: 300 [IU] via INTRAMUSCULAR

## 2023-08-17 NOTE — Progress Notes (Signed)
Dysport Injection for spasticity using needle EMG guidance Indication:  Cervical dystonia G24.3   Dilution: 500 Units/65ml        Total Units Injected: 800 Indication: Severe spasticity which interferes with ADL,mobility and/or  hygiene and is unresponsive to medication management and other conservative care Informed consent was obtained after describing risks and benefits of the procedure with the patient. This includes bleeding, bruising, infection, excessive weakness, or medication side effects. A REMS form is on file and signed.   Side: right  Needle: 50mm injectable monopolar needle electrode   Number of units per muscle Trapezius 500 units 6 access points Levator scapulae 0 units  0 access points Sternocleidomastoid 300 units 4 access points, higher on muscle Scalenes 0 units 0 access points     All injections were done after obtaining appropriate EMG activity and after negative drawback for blood. The patient tolerated the procedure well. Post procedure instructions were given.    We stayed posterior and superior for these injections given recent dysphagia after the last set of injections. Fortunately, his greatest EMG was in the traps today. We discussed stretching, heat, massage, traction as well.

## 2023-08-17 NOTE — Patient Instructions (Signed)
ALWAYS FEEL FREE TO CALL OUR OFFICE WITH ANY PROBLEMS OR QUESTIONS ([phone])  **PLEASE NOTE** ALL MEDICATION REFILL REQUESTS (INCLUDING CONTROLLED SUBSTANCES) NEED TO BE MADE AT LEAST 7 DAYS PRIOR TO REFILL BEING DUE. ANY REFILL REQUESTS INSIDE THAT TIME FRAME MAY RESULT IN DELAYS IN RECEIVING YOUR PRESCRIPTION.                    

## 2023-08-25 ENCOUNTER — Other Ambulatory Visit (HOSPITAL_COMMUNITY): Payer: Self-pay

## 2023-08-25 ENCOUNTER — Other Ambulatory Visit: Payer: Self-pay | Admitting: Neurology

## 2023-08-26 ENCOUNTER — Other Ambulatory Visit (HOSPITAL_COMMUNITY): Payer: Self-pay

## 2023-08-26 MED ORDER — AIMOVIG 140 MG/ML ~~LOC~~ SOAJ
140.0000 mg | SUBCUTANEOUS | 11 refills | Status: DC
  Filled 2023-08-26: qty 1, 28d supply, fill #0

## 2023-08-26 MED ORDER — UBRELVY 100 MG PO TABS
1.0000 | ORAL_TABLET | ORAL | 11 refills | Status: DC | PRN
  Filled 2023-08-26: qty 16, 30d supply, fill #0

## 2023-08-29 ENCOUNTER — Telehealth: Payer: Self-pay

## 2023-08-29 NOTE — Telephone Encounter (Signed)
PA needed for Ubrelvy 100 mg

## 2023-08-30 ENCOUNTER — Other Ambulatory Visit (HOSPITAL_COMMUNITY): Payer: Self-pay

## 2023-08-30 ENCOUNTER — Other Ambulatory Visit: Payer: Self-pay | Admitting: Neurology

## 2023-08-30 ENCOUNTER — Other Ambulatory Visit: Payer: Self-pay

## 2023-08-30 MED ORDER — AIMOVIG 140 MG/ML ~~LOC~~ SOAJ
140.0000 mg | SUBCUTANEOUS | 3 refills | Status: DC
  Filled 2023-09-22: qty 1, 28d supply, fill #0
  Filled 2023-09-22: qty 3, 84d supply, fill #0

## 2023-08-30 MED ORDER — UBRELVY 100 MG PO TABS
1.0000 | ORAL_TABLET | ORAL | 3 refills | Status: DC | PRN
  Filled 2023-08-30: qty 48, 90d supply, fill #0
  Filled 2023-10-31 – 2023-11-14 (×3): qty 48, 90d supply, fill #1
  Filled 2023-12-30 – 2024-03-12 (×2): qty 48, 90d supply, fill #2

## 2023-08-30 NOTE — Telephone Encounter (Signed)
Done

## 2023-09-01 NOTE — Telephone Encounter (Signed)
Per Insurance:  Authorization already on file for this request. Authorization starting on 06/07/2023 and ending on 09/05/2024.

## 2023-09-06 ENCOUNTER — Other Ambulatory Visit (HOSPITAL_COMMUNITY): Payer: Self-pay

## 2023-09-12 ENCOUNTER — Other Ambulatory Visit (HOSPITAL_COMMUNITY): Payer: Self-pay

## 2023-09-22 ENCOUNTER — Other Ambulatory Visit (HOSPITAL_COMMUNITY): Payer: Self-pay

## 2023-09-22 ENCOUNTER — Other Ambulatory Visit: Payer: Self-pay

## 2023-09-22 MED ORDER — OZEMPIC (0.25 OR 0.5 MG/DOSE) 2 MG/3ML ~~LOC~~ SOPN
0.5000 mg | PEN_INJECTOR | SUBCUTANEOUS | 3 refills | Status: DC
  Filled 2023-09-22: qty 3, 28d supply, fill #0
  Filled 2023-10-31: qty 9, 84d supply, fill #1

## 2023-09-27 ENCOUNTER — Ambulatory Visit: Payer: Medicare PPO | Attending: Cardiology | Admitting: Cardiology

## 2023-09-27 ENCOUNTER — Ambulatory Visit (INDEPENDENT_AMBULATORY_CARE_PROVIDER_SITE_OTHER): Payer: Medicare PPO

## 2023-09-27 ENCOUNTER — Encounter: Payer: Self-pay | Admitting: Cardiology

## 2023-09-27 VITALS — BP 124/68 | HR 81 | Ht 75.0 in

## 2023-09-27 DIAGNOSIS — I442 Atrioventricular block, complete: Secondary | ICD-10-CM | POA: Diagnosis not present

## 2023-09-27 DIAGNOSIS — I10 Essential (primary) hypertension: Secondary | ICD-10-CM | POA: Diagnosis not present

## 2023-09-27 DIAGNOSIS — Z950 Presence of cardiac pacemaker: Secondary | ICD-10-CM

## 2023-09-27 DIAGNOSIS — Z952 Presence of prosthetic heart valve: Secondary | ICD-10-CM

## 2023-09-27 DIAGNOSIS — I493 Ventricular premature depolarization: Secondary | ICD-10-CM

## 2023-09-27 LAB — CUP PACEART INCLINIC DEVICE CHECK
Battery Remaining Longevity: 61 mo
Battery Voltage: 2.99 V
Brady Statistic RA Percent Paced: 21 %
Brady Statistic RV Percent Paced: 4.2 %
Date Time Interrogation Session: 20250121123232
Implantable Lead Connection Status: 753985
Implantable Lead Connection Status: 753985
Implantable Lead Implant Date: 20180710
Implantable Lead Implant Date: 20180710
Implantable Lead Location: 753859
Implantable Lead Location: 753860
Implantable Pulse Generator Implant Date: 20180710
Lead Channel Impedance Value: 412.5 Ohm
Lead Channel Impedance Value: 475 Ohm
Lead Channel Pacing Threshold Amplitude: 0.5 V
Lead Channel Pacing Threshold Amplitude: 0.5 V
Lead Channel Pacing Threshold Amplitude: 0.75 V
Lead Channel Pacing Threshold Amplitude: 0.75 V
Lead Channel Pacing Threshold Pulse Width: 0.4 ms
Lead Channel Pacing Threshold Pulse Width: 0.4 ms
Lead Channel Pacing Threshold Pulse Width: 0.4 ms
Lead Channel Pacing Threshold Pulse Width: 0.4 ms
Lead Channel Sensing Intrinsic Amplitude: 1.3 mV
Lead Channel Sensing Intrinsic Amplitude: 2 mV
Lead Channel Setting Pacing Amplitude: 1 V
Lead Channel Setting Pacing Amplitude: 2 V
Lead Channel Setting Pacing Pulse Width: 0.4 ms
Lead Channel Setting Sensing Sensitivity: 0.5 mV
Pulse Gen Model: 2272
Pulse Gen Serial Number: 8912213

## 2023-09-27 NOTE — Progress Notes (Signed)
  Electrophysiology Office Note:   Date:  09/27/2023  ID:  Antonio Oliver, DOB [birth year], MRN [id number]  Primary Cardiologist: Olga Millers, MD Primary Heart Failure: None Electrophysiologist: Cloe Sockwell Jorja Loa, MD      History of Present Illness:   Antonio Oliver is a 68 y.o. male with h/o aortic valve replacement and thoracic aortic aneurysm repair, complete heart block, diabetes, obesity, hyperlipidemia seen today for routine electrophysiology followup.   Since last being seen in our clinic the patient reports doing about his baseline.  He has baseline dizziness, fatigue.  He has been worked up for autonomic dysfunction by multiple people including his cardiologist.  He has been sent to [facility] for this.  His pacemaker is functioning appropriately.  He has no acute arrhythmia complaints.  He is having PVCs intermittently on his EKG as well as pacemaker interrogation.  he denies chest pain, palpitations, dyspnea, PND, orthopnea, nausea, vomiting, dizziness, syncope, edema, weight gain, or early satiety.   Review of systems complete and found to be negative unless listed in HPI.      EP Information / Studies Reviewed:    EKG is ordered today. Personal review as below.  EKG Interpretation Date/Time:  Tuesday September 27 2023 11:04:03 EST Ventricular Rate:  81 PR Interval:  212 QRS Duration:  160 QT Interval:  428 QTC Calculation: 497 R Axis:   -56  Text Interpretation: Sinus rhythm with 1st degree A-V block with frequent Premature ventricular complexes Right bundle branch block Left anterior fascicular block Bifascicular block Septal infarct , age undetermined When compared with ECG of 25-Oct-2022 10:20, Premature ventricular complexes Confirmed by Latarshia Jersey ([id number]) on 09/27/2023 11:08:40 AM   PPM Interrogation-  reviewed in detail today,  See PACEART report.  Device History: Abbott Dual Chamber PPM implanted 2018 for CHB  Risk Assessment/Calculations:               Physical Exam:   VS:  BP 124/68 (BP Location: Left Arm, Patient Position: Sitting, Cuff Size: Large)   Pulse 81   Ht 6\' 3"  (1.905 m)   SpO2 95%   BMI 22.87 kg/m    Wt Readings from Last 3 Encounters:  08/17/23 183 lb (83 kg)  05/26/23 191 lb (86.6 kg)  04/27/23 193 lb 6.4 oz (87.7 kg)     GEN: Well nourished, well developed in no acute distress NECK: No JVD; No carotid bruits CARDIAC: Regular rate and rhythm with occasional ectopy, no murmurs, rubs, gallops RESPIRATORY:  Clear to auscultation without rales, wheezing or rhonchi  ABDOMEN: Soft, non-tender, non-distended EXTREMITIES:  No edema; No deformity   ASSESSMENT AND PLAN:    CHB s/p Abbott PPM  Normal PPM function See Pace Art report No changes today  2.  Status post AVR: Plan per primary cardiology.  3.  Hypertension: Currently well-controlled  4.  Thoracic aortic aneurysm: Continue plan per primary cardiology  5.  PVCs: Lilyana Lippman plan for 2-week monitor.  Disposition:   Follow up with EP APP in 12 months  Signed, Beryle Bagsby Jorja Loa, MD

## 2023-09-27 NOTE — Progress Notes (Unsigned)
Enrolled patient for a 14 day Zio XT  monitor to be mailed to patients home  °

## 2023-09-27 NOTE — Progress Notes (Signed)
HPI: Follow-up history of aortic valve replacement and thoracic aortic aneurysm. Patient has a history of bicuspid aortic valve and underwent aortic valve replacement March 10, 2017.  Procedure was complicated by complete heart block requiring pacemaker placement.  Note preoperative cardiac catheterization revealed no coronary disease. Nuclear study August 2022 showed no ischemia. CTA April 2023 showed 4.3 cm ascending aorta.  Renal Dopplers May 2023 showed 1 to 59% right and no stenosis on the left.  Carotid Dopplers March 2024 near normal on the right and left.  Echocardiogram March 2024 showed normal LV function, mild left ventricular hypertrophy, grade 2 diastolic dysfunction, severe biatrial enlargement, moderate to severe mitral regurgitation, status post AVR with mean gradient 9 mmHg, no aortic insufficiency and mildly dilated ascending aorta at 44 mm.  CTA April 2024 showed 4.1 cm ascending thoracic aortic aneurysm and small bilateral pleural effusions.  Since last seen there is no dyspnea, chest pain or syncope.  He continues to have difficulties with balance and blood pressure spikes.  Current Outpatient Medications  Medication Sig Dispense Refill   aspirin EC 81 MG tablet Take 81 mg by mouth daily.     atorvastatin (LIPITOR) 20 MG tablet Take 20 mg by mouth daily at 6 PM.      buPROPion (WELLBUTRIN SR) 200 MG 12 hr tablet Take 200 mg by mouth 2 (two) times daily.     busPIRone (BUSPAR) 10 MG tablet Take 20 mg by mouth 2 (two) times daily.     CALCIUM CITRATE PO Take 500 mg by mouth 2 (two) times daily.     Cholecalciferol (VITAMIN D-3 PO) Take 5,000 capsules by mouth daily.     clobetasol (TEMOVATE) 0.05 % external solution Apply 1 Application topically as needed.     clonazePAM (KLONOPIN) 0.5 MG tablet Take 0.5 mg by mouth 2 (two) times daily as needed for anxiety.     diclofenac Sodium (VOLTAREN) 1 % GEL Apply 1 application  topically as needed.     dicyclomine (BENTYL) 20 MG tablet  Take 20 mg by mouth 4 (four) times daily as needed.     docusate sodium (COLACE) 100 MG capsule Take 1 capsule (100 mg total) by mouth 2 (two) times daily. 10 capsule 0   DULoxetine (CYMBALTA) 60 MG capsule Take 60 mg by mouth 2 (two) times daily.      Erenumab-aooe (AIMOVIG) 140 MG/ML SOAJ Inject 140 mg into the skin every 28 (twenty-eight) days. 3 mL 3   ferrous sulfate 325 (65 FE) MG EC tablet Take 325 mg by mouth daily.     finasteride (PROSCAR) 5 MG tablet Take 5 mg by mouth daily.     fluticasone (FLONASE) 50 MCG/ACT nasal spray Place 2 sprays into the nose daily as needed for allergies or rhinitis.     folic acid (FOLVITE) 1 MG tablet Take 1 mg by mouth daily.     furosemide (LASIX) 20 MG tablet Take 1 tablet (20 mg total) by mouth as needed (for leg swelling). 30 tablet 4   hydrALAZINE (APRESOLINE) 10 MG tablet Take 1 tablet three times daily for systolic bp > 140 or diastolic bp > 90 (Patient taking differently: 25 mg. Take 1 tablet three times daily for systolic bp > 140 or diastolic bp > 90) 270 tablet 3   levothyroxine (SYNTHROID) 125 MCG tablet Take 125 mcg by mouth daily.      lidocaine (LIDODERM) 5 % as needed.     loratadine (CLARITIN) 10 MG  tablet Take 10 mg by mouth daily as needed for allergies.     MAGNESIUM PO Take 500 mg by mouth in the morning and at bedtime.     meclizine (ANTIVERT) 25 MG tablet Take 25 mg by mouth 3 (three) times daily as needed for dizziness.     metFORMIN (GLUCOPHAGE) 1000 MG tablet Take 1,000 mg by mouth 2 (two) times daily with a meal.     Multiple Vitamins-Minerals (MULTIVITAMIN WITH MINERALS) tablet Take 1 tablet by mouth daily.     mupirocin ointment (BACTROBAN) 2 % Apply 1 application topically 3 (three) times daily as needed (infection).     naloxone (NARCAN) nasal spray 4 mg/0.1 mL Instill or spray into the nose if you are experiencing shortness of breath or signs of opioid overdose. 1 each 0   omeprazole (PRILOSEC) 20 MG capsule Take 20 mg by  mouth 2 (two) times daily before a meal.      oxyCODONE-acetaminophen (PERCOCET) 10-325 MG tablet Take 0.5 tablets by mouth See admin instructions. 1/2 tablet every 3 hours.     polyethylene glycol (MIRALAX / GLYCOLAX) 17 g packet Take 17 g by mouth daily as needed.     potassium chloride (KLOR-CON) 10 MEQ tablet Take 10 mEq by mouth as needed (with lasix dose).     pregabalin (LYRICA) 100 MG capsule Take 100 mg by mouth 3 (three) times daily.     Semaglutide,0.25 or 0.5MG /DOS, (OZEMPIC, 0.25 OR 0.5 MG/DOSE,) 2 MG/3ML SOPN Inject 0.5 mg into the skin once a week. 3 mL 3   silodosin (RAPAFLO) 8 MG CAPS capsule Take 8 mg by mouth daily.     solifenacin (VESICARE) 10 MG tablet Take 10 mg by mouth at bedtime.     tiZANidine (ZANAFLEX) 4 MG tablet Take 4 mg by mouth 3 (three) times daily.     traMADol (ULTRAM) 50 MG tablet Take 50 mg by mouth 3 (three) times daily.     Ubrogepant (UBRELVY) 100 MG TABS Take 1 tablet (100 mg total) by mouth as needed (May repeat in 2 hours.  Maximum 2 tablets in 24 hours.). 48 tablet 3   Erenumab-aooe (AIMOVIG) 140 MG/ML SOAJ Inject 140 mg into the skin every 28 (twenty-eight) days. (Patient not taking: Reported on 10/10/2023) 1 mL 11   Multiple Vitamins-Minerals (PRESERVISION/LUTEIN) CAPS Take 1 capsule by mouth at bedtime. (Patient not taking: Reported on 10/10/2023)     No current facility-administered medications for this visit.     Past Medical History:  Diagnosis Date   Anxiety and depression    Ascending aorta dilation (HCC) 03/06/2019   4.3 cm 2022   Benign prostatic hyperplasia without lower urinary tract symptoms 11/17/2016   Last Assessment & Plan:  Stable PSA today.  Formatting of this note might be different from the original. Last Assessment & Plan:  Stable PSA today. Last Assessment & Plan:  Formatting of this note might be different from the original. Stable PSA today.   BPH (benign prostatic hyperplasia)    Bradycardia 02/01/2019   Cervical  myelopathy (HCC) 11/14/2018   Charcot's joint of foot 03/05/2019   Chronic back pain 04/03/2013   Chronic inflammatory arthritis 11/17/2016   History of positive rheumatoid factor in the past. Denies placement on immunosuppressive therapy   Chronic pain syndrome    Failed back surgical syndrome.  Chronic neck pain.  Chronic low back pain with sciatica bilateral   Colon cancer screening    2023 cologuard neg   Complete heart  block [facility])    Following AVR s/p St Jude PPM in 03/2017   DM2 (diabetes mellitus, type 2) (HCC) 04/03/2013   Last Assessment & Plan:  Formatting of this note might be different from the original. Diabetes is unchanged.  Continue current treatment regimen. Regular aerobic exercise. Diabetes will be reassessed in 3 months. Will check A1c today. Denies any problems with feet or sensation.   Elevated PSA 11/2021   Enthesopathy of ankle and tarsus 04/04/2013   Last Assessment & Plan:  Continues with swelling after prolonged standing.  Still  Wearing support hose, which helps.   Essential hypertension 10/30/2018   FHx: migraine headaches 04/04/2013   Fracture of capitate bone of wrist 02/26/2019   Fracture of tibia 03/05/2019   GERD (gastroesophageal reflux disease) 10/30/2018   Glaucoma 04/03/2013   Hemangioma of skin and subcutaneous tissue 07/07/2017   History of aortic valve replacement 2018   bicuspid AV   History of kidney stones 11/17/2016   History of sleep apnea    wt loss->gone   Hypothyroidism 10/30/2018   IDA (iron deficiency anemia) 11/2021   malabs d/t gastric bipass   Lesion of plantar nerve 04/04/2013   Lumbar stenosis with neurogenic claudication 01/08/2021   Major depressive disorder, recurrent (HCC)    ECT in the remote past.  Patient disabled due to recurrent depression and chronic pain.   Migraine syndrome    Mixed dyslipidemia 10/30/2018   Morbid obesity (HCC) 10/27/2011   Nondisplaced fracture of medial cuneiform of left foot, initial  encounter for closed fracture 11/16/2018   Osteoarthritis 10/30/2018   Personal history of tobacco use, presenting hazards to health 04/04/2013   Presence of permanent cardiac pacemaker    Severe single current episode of major depressive disorder, without psychotic features (HCC) 11/17/2016   Status post bariatric surgery 04/05/2011   bipass surg --presurg wt 310 lbs   Syncope 02/14/2019   Vitamin D deficiency 10/30/2018    Past Surgical History:  Procedure Laterality Date   ANTERIOR CERVICAL DECOMP/DISCECTOMY FUSION N/A 11/17/2018   Procedure: CERVICAL THREE-CERVICAL FOUR, CERVICAL FOUR-CERVICAL FIVE, CERVICAL FIVE-CERVICAL SIX ANTERIOR CERVICAL DECOMPRESSION/DISCECTOMY FUSION;  Surgeon: Julio Sicks, MD;  Location: [facility] OR;  Service: Neurosurgery;  Laterality: N/A;   AORTIC VALVE REPLACEMENT  03/2017   APPLICATION OF WOUND VAC Left 04/05/2019   Procedure: Application Of Wound Vac;  Surgeon: Terance Hart, MD;  Location: [facility] OR;  Service: Orthopedics;  Laterality: Left;   BACK SURGERY     x4   CARDIOVASCULAR STRESS TEST  05/05/2021   lexiscan neg   FASCIOTOMY Left 04/05/2019   Left leg 2 compartment fasciotomy   FASCIOTOMY Left 04/05/2019   Procedure: FASCIOTOMY LEFT LOWER LEG;  Surgeon: Terance Hart, MD;  Location: [facility] OR;  Service: Orthopedics;  Laterality: Left;   FOOT NEUROMA SURGERY     GASTRIC BYPASS  2012   HEMATOMA EVACUATION Left 04/05/2019   Procedure: Evacuation Hematoma Left Lower Leg;  Surgeon: Terance Hart, MD;  Location: [facility] OR;  Service: Orthopedics;  Laterality: Left;   HEMORRHOID SURGERY     over 30 years ago   HERNIA REPAIR     LIH umb   I & D EXTREMITY Left 04/13/2022   Procedure: IRRIGATION AND DEBRIDEMENT OF ELBOW AND BURSECTOMY;  Surgeon: Bradly Bienenstock, MD;  Location: [facility] OR;  Service: Orthopedics;  Laterality: Left;   LUMBAR LAMINECTOMY/DECOMPRESSION MICRODISCECTOMY N/A 01/08/2021   Procedure: CENTRAL LUMBAR LAMINECTOMY LUMBAR TWO-THREE,  LUMBAR THREE-FOUR;  Surgeon: Kerrin Champagne,  MD;  Location: [facility] OR;  Service: Orthopedics;  Laterality: N/A;   NASAL SINUS SURGERY     x2   NECK SURGERY     OLECRANON BURSECTOMY  2023   d/t septic bursitis   TRANSTHORACIC ECHOCARDIOGRAM  12/2020   EF normal, mod MR, ascending AA (4.4 cm)    Social History   Socioeconomic History   Marital status: Married    Spouse name: susan    Number of children: 1   Years of education: Not on file   Highest education level: Bachelor's degree (e.g., BA, AB, BS)  Occupational History   Not on file  Tobacco Use   Smoking status: Never   Smokeless tobacco: Never  Vaping Use   Vaping status: Never Used  Substance and Sexual Activity   Alcohol use: Not Currently    Alcohol/week: 1.0 standard drink of alcohol    Types: 1 Glasses of wine per week    Comment: occ.   Drug use: No   Sexual activity: Not on file  Other Topics Concern   Not on file  Social History Narrative   Patient lives at home with spouse Darl Pikes   Educ: BS   Occup: retired Banker.  Disabled due to depression and pain.   Tobacco: None   Alc: none   Right handed   Social Drivers of Corporate investment banker Strain: Low Risk  (05/30/2019)   Received from [employer], [facility]   Overall Financial Resource Strain (CARDIA)    Difficulty of Paying Living Expenses: Not hard at all  Food Insecurity: No Food Insecurity (05/30/2019)   Received from [facility], [facility]   Hunger Vital Sign    Worried About Running Out of Food in the Last Year: Never true    Ran Out of Food in the Last Year: Never true  Transportation Needs: Unmet Transportation Needs (05/30/2019)   Received from [facility], [facility]   PRAPARE - Transportation    Lack of Transportation (Medical): Yes    Lack of Transportation (Non-Medical): Yes  Physical Activity: Not on file  Stress: Not on file  Social Connections: Unknown (01/06/2023)   Received from [facility],  [facility]   Social Network    Social Network: Not on file  Intimate Partner Violence: Unknown (01/06/2023)   Received from [employer], [facility]   HITS    Physically Hurt: Not on file    Insult or Talk Down To: Not on file    Threaten Physical Harm: Not on file    Scream or Curse: Not on file    Family History  Problem Relation Age of Onset   Cancer Mother        breast   Rheum arthritis Mother    Diabetes Mother    Heart disease Father    Kidney disease Father    Diabetes Father    Hypertension Brother    Diabetes Brother    Osteoarthritis Brother    Heart disease Brother    Hypertension Brother    Diabetes Brother    Rheum arthritis Brother    Depression Daughter    Anxiety disorder Daughter     ROS: no fevers or chills, productive cough, hemoptysis, dysphasia, odynophagia, melena, hematochezia, dysuria, hematuria, rash, seizure activity, orthopnea, PND, pedal edema, claudication. Remaining systems are negative.  Physical Exam: Well-developed well-nourished in no acute distress.  Skin is warm and dry.  HEENT is normal.  Neck is supple.  Chest is  clear to auscultation with normal expansion.  Cardiovascular exam is regular rate and rhythm.  Abdominal exam nontender or distended. No masses palpated. Extremities show no edema. neuro grossly intact  A/P  1 status post aortic valve replacement-continue SBE prophylaxis.  2 history of thoracic aortic aneurysm-plan follow-up CTA April 2025.  3 hypertension-patient's blood pressure continues to run low.  He is being evaluated by neurology.  4 history of moderate to severe mitral regurgitation-plan follow-up echocardiogram March 2025.  5 hyperlipidemia-continue statin.  6 pacemaker-managed by electrophysiology.  7 history of palpitations-beta-blocker was discontinued previously secondary to hypotension.  8 weakness/dysautonomia-being evaluated at [facility].  Olga Millers, MD

## 2023-09-27 NOTE — Patient Instructions (Signed)
Medication Instructions:  Your physician recommends that you continue on your current medications as directed. Please refer to the Current Medication list given to you today.  *If you need a refill on your cardiac medications before your next appointment, please call your pharmacy*   Lab Work: None ordered.  If you have labs (blood work) drawn today and your tests are completely normal, you will receive your results only by: MyChart Message (if you have MyChart) OR A paper copy in the mail If you have any lab test that is abnormal or we need to change your treatment, we will call you to review the results.   Testing/Procedures: Antonio Oliver- Long Term Monitor Instructions  Your physician has requested you wear a ZIO patch monitor for 14 days.  This is a single patch monitor. [employer] supplies one patch monitor per enrollment. Additional stickers are not available. Please do not apply patch if you will be having a Nuclear Stress Test,  Echocardiogram, Cardiac CT, MRI, or Chest Xray during the period you would be wearing the  monitor. The patch cannot be worn during these tests. You cannot remove and re-apply the  ZIO XT patch monitor.  Your ZIO patch monitor will be mailed 3 day USPS to your address on file. It may take 3-5 days  to receive your monitor after you have been enrolled.  Once you have received your monitor, please review the enclosed instructions. Your monitor  has already been registered assigning a specific monitor serial # to you.  Billing and Patient Assistance Program Information  We have supplied [employer] with any of your insurance information on file for billing purposes. [employer] offers a sliding scale Patient Assistance Program for patients that do not have  insurance, or whose insurance does not completely cover the cost of the ZIO monitor.  You must apply for the Patient Assistance Program to qualify for this discounted rate.  To apply, please call [employer] at  [phone], select option 4, select option 2, ask to apply for  Patient Assistance Program. [name] will ask your household income, and how many people  are in your household. They will quote your out-of-pocket cost based on that information.  [employer] will also be able to set up a 26-month, interest-free payment plan if needed.  Applying the monitor   Shave hair from upper left chest.  Hold abrader disc by orange tab. Rub abrader in 40 strokes over the upper left chest as  indicated in your monitor instructions.  Clean area with 4 enclosed alcohol pads. Let dry.  Apply patch as indicated in monitor instructions. Patch will be placed under collarbone on left  side of chest with arrow pointing upward.  Rub patch adhesive wings for 2 minutes. Remove white label marked "1". Remove the white  label marked "2". Rub patch adhesive wings for 2 additional minutes.  While looking in a mirror, press and release button in center of patch. A small green light will  flash 3-4 times. This will be your only indicator that the monitor has been turned on.  Do not shower for the first 24 hours. You may shower after the first 24 hours.  Press the button if you feel a symptom. You will hear a small click. Record Date, Time and  Symptom in the Patient Logbook.  When you are ready to remove the patch, follow instructions on the last 2 pages of Patient  Logbook. Stick patch monitor onto the last page of Patient Logbook.  Place Patient  Logbook in the blue and white box. Use locking tab on box and tape box closed  securely. The blue and white box has prepaid postage on it. Please place it in the mailbox as  soon as possible. Your physician should have your test results approximately 7 days after the  monitor has been mailed back to [facility].  Call [facility] Customer Care at [phone] if you have questions regarding  your ZIO XT patch monitor. Call them immediately if you see an orange light  blinking on your  monitor.  If your monitor falls off in less than 4 days, contact our Monitor department at [phone].  If your monitor becomes loose or falls off after 4 days call [employer] at [phone] for  suggestions on securing your monitor    Follow-Up: At [facility], you and your health needs are our priority.  As part of our continuing mission to provide you with exceptional heart care, we have created designated Provider Care Teams.  These Care Teams include your primary Cardiologist (physician) and Advanced Practice Providers (APPs -  Physician Assistants and Nurse Practitioners) who all work together to provide you with the care you need, when you need it.  We recommend signing up for the patient portal called "MyChart".  Sign up information is provided on this After Visit Summary.  MyChart is used to connect with patients for Virtual Visits (Telemedicine).  Patients are able to view lab/test results, encounter notes, upcoming appointments, etc.  Non-urgent messages can be sent to your provider as well.   To learn more about what you can do with MyChart, go to [email or web address].    Your next appointment:   12 months with Dr Elberta Fortis PA

## 2023-10-10 ENCOUNTER — Encounter: Payer: Self-pay | Admitting: Cardiology

## 2023-10-10 ENCOUNTER — Ambulatory Visit: Payer: Medicare PPO | Admitting: Cardiology

## 2023-10-10 VITALS — BP 130/81 | HR 82 | Ht 75.0 in | Wt 188.1 lb

## 2023-10-10 DIAGNOSIS — I10 Essential (primary) hypertension: Secondary | ICD-10-CM

## 2023-10-10 DIAGNOSIS — Z952 Presence of prosthetic heart valve: Secondary | ICD-10-CM

## 2023-10-10 DIAGNOSIS — I7121 Aneurysm of the ascending aorta, without rupture: Secondary | ICD-10-CM | POA: Diagnosis not present

## 2023-10-10 DIAGNOSIS — E785 Hyperlipidemia, unspecified: Secondary | ICD-10-CM

## 2023-10-10 NOTE — Patient Instructions (Signed)

## 2023-10-14 NOTE — Progress Notes (Signed)
 NEUROLOGY FOLLOW UP OFFICE NOTE  Antonio Oliver [id number]  Assessment/Plan:   Dysautonomia - Unclear etiology.  Underlying Parkinson or Parkinson-plus syndrome not suspected.  Autoimmune autonomic ganglionopathy panel negative.   Sensorimotor axonal polyneuropathy - idiopathic vs diabetic Migraine without aura, without status migrainosus, not intractable - unclear if appropriate treatment compromised by his poor health.    Awaiting evaluation at [facility] for autonomic testing. Migraine prevention:  Plan to change from Aimovig  back to Emgality . Migraine rescue:  Ubrelvy  Follow up 5 months.  Total time spent reviewing chart, completing note and face to face with patient and wife discussing diagnosis and plan:  47 minutes.    Subjective:  Antonio Oliver is a 68 year old male with complete heart block s/p PPM, s/p aortic valve repair, positive RF (rheumatoid arthritis not suspected), diabetes, depression/anxiety, chronic back pain and cervical dystonia with history of cervical myelopathy who follows up for continued physical decline.  He is accompanied by his wife who supplements history.   UPDATE: Due to worsening migraines, changed from Emgality  to Aimovig . No improvement.  Averages 5-7 a week.  Usually lasts 3-4 hours with Ubrelvy . Thinks did better on Emgality .  Saw Dr. Almodovar Suarez at [facility].  Tilt table was ordered and scheduled with Dr. Sklerov for autonomic testing at [facility] in August.  He hasn't passed out but he continues to feel dizzy.  When he turns his head, it takes a moment for his eyes to catch up.       HISTORY: Orthostatic hypotension: He reports multiple episodes of his legs just giving out.  Chronic dizziness for about a year.  He describes spinning.  Sometimes he feels like he is going to pass out when he stands up.  He has orthostatic hypotension at times.  His cardiologist said it is not his heart.  He is wearing compression stockings.  Denies double vision.   Notes occasional tremor in hands.  No history of REM sleep behavior.  Due to pain, he was unable to lay down for DaT scan.  Skin biopsy negative for P-SYN.  He progressively declined.  He is extremely weak and cannot move his legs.  He is experiencing more tremors.  Blood pressure profoundly fluctuates.  Seen in ED on 10/25/2022.  CT head personally reviewed revealed stable arachnoid cyst at the vertex and moderate chronic small vessel ischemic changes but no acute abnormality.  Cardiology does not have an answer.  He has also had progressive weakness.  He has history of cervical stenosis with myelopathy and lumbar laminectomy.  MRI of cervical spine on 06/22/2022 showed stable postoperative changes from C3-6 with chronic myelomalacia at C3-4 level of spinal cord but no acute myelopathy or compressive spinal stenosis.   MRI of lumbar spine showed postoperative changes of decompressive laminectomy at L3-4 and interbody fusion at L4-5 and L5-S1 without any significant spinal canal or neural foraminal stenosis.  Stopped Rixulti in [place] 2024 without improvement in symptoms.  He was referred to the movement disorder clinic at [facility] where he was evaluated by Dr. Kevon Pellegrini Thenganatt.  She did not appreciate any clear signs of parkinsonism on her exam.  She ordered complete paraneoplastic panel which was negative.  He was referred to Dr. Sklerov for autonomic testing at [facility].  Scheduled in August 2025  On exam, he exhibited neuropathy in the feet up to mid-shin, which his wife says has progressed.  In December 2023, his podiatrist checked him and he only exhibited sensory  loss in the feet.  NCV-EMG of left upper extremity and lower limbs on 01/03/2023 revealed evidence of mild to moderate large fiber sensorimotor axonal neuropathy.  Also noted were possible evidence of left ulnar mononeuropathy and left CTS which may be affected by low temperature.  Checked labs in May 2024.  Autoimmune autonomic ganglionopathy  panel was negative.  Further labs for causes of neuropathy were checked, including B12 999, ACE 39, normal IFE and negative SSA/SSB antibodies.    In March 2024, he endorsed left facial numbness.  MRI of brain with and without contrast on 01/21/2023 revealed chronic small vessel ischemic changes, incidental small left vertex arachnoid cyst but no acute intracranial abnormalities.   Migraines: Onset:  1980s Location:  Usually bifrontal Quality:  Squeezing, throbbing, pounding Initial Intensity:  7-8/10.  He denies new headache, thunderclap headache or severe headache that wakes from sleep. Aura:  none Premonitory Phase:  none Postdrome:  none Associated symptoms: Photophobia, phonophobia, blurred vision.  He denies associated nausea, vomiting, unilateral numbness or weakness. Initial Duration:  Usually a day.  Has happened up to 3 days. Initial Frequency:  10 to 15 days a month, started Botox in 2018 and improved once a month. Initial Frequency of abortive medication: 1 a month Triggers:  Unknown Relieving factors:  none Activity:  aggravates   CT head 11/14/18 personally reviewed and was normal. CT cervical spine 11/06/18 personally reviewed and demonstrated multilevel severe facet arthropathy with moderate bilateral neural foraminal stenosis at C3-4 and C4-5 as well as prior interbody fusion at C6-7 without stenosis.   Past NSAIDS:  ibuprofen Past analgesics:  Tylenol , Excedrin, Fioricet, Midrin Past abortive triptans:   Zomig 5mg , Sumatriptan  6mg  Sidon Past abortive ergotamine: none Past muscle relaxants:  Zanaflex  Past anti-emetic:  none Past antihypertensive medications:  Verapamil, propranolol, clonidine Past antidepressant medications:  amitriptyline Past anticonvulsant medications:  topiramate 200mg  Past anti-CGRP:  Ajovy, Emgality  Past vitamins/Herbal/Supplements:  none Past antihistamines/decongestants:  none Other past therapies:  Botox (effective but did not like getting all  those injections every 3 months)   Family history of headache:  2 brothers had migraines.      PAST MEDICAL HISTORY: Past Medical History:  Diagnosis Date   Anxiety and depression    Ascending aorta dilation (HCC) 03/06/2019   4.3 cm 2022   Benign prostatic hyperplasia without lower urinary tract symptoms 11/17/2016   Last Assessment & Plan:  Stable PSA today.  Formatting of this note might be different from the original. Last Assessment & Plan:  Stable PSA today. Last Assessment & Plan:  Formatting of this note might be different from the original. Stable PSA today.   BPH (benign prostatic hyperplasia)    Bradycardia 02/01/2019   Cervical myelopathy (HCC) 11/14/2018   Charcot's joint of foot 03/05/2019   Chronic back pain 04/03/2013   Chronic inflammatory arthritis 11/17/2016   History of positive rheumatoid factor in the past. Denies placement on immunosuppressive therapy   Chronic pain syndrome    Failed back surgical syndrome.  Chronic neck pain.  Chronic low back pain with sciatica bilateral   Colon cancer screening    2023 cologuard neg   Complete heart block (HCC)    Following AVR s/p St Jude PPM in 03/2017   DM2 (diabetes mellitus, type 2) (HCC) 04/03/2013   Last Assessment & Plan:  Formatting of this note might be different from the original. Diabetes is unchanged.  Continue current treatment regimen. Regular aerobic exercise. Diabetes will be reassessed in 3  months. Will check A1c today. Denies any problems with feet or sensation.   Elevated PSA 11/2021   Enthesopathy of ankle and tarsus 04/04/2013   Last Assessment & Plan:  Continues with swelling after prolonged standing.  Still  Wearing support hose, which helps.   Essential hypertension 10/30/2018   FHx: migraine headaches 04/04/2013   Fracture of capitate bone of wrist 02/26/2019   Fracture of tibia 03/05/2019   GERD (gastroesophageal reflux disease) 10/30/2018   Glaucoma 04/03/2013   Hemangioma of skin and  subcutaneous tissue 07/07/2017   History of aortic valve replacement 2018   bicuspid AV   History of kidney stones 11/17/2016   History of sleep apnea    wt loss->gone   Hypothyroidism 10/30/2018   IDA (iron deficiency anemia) 11/2021   malabs d/t gastric bipass   Lesion of plantar nerve 04/04/2013   Lumbar stenosis with neurogenic claudication 01/08/2021   Major depressive disorder, recurrent (HCC)    ECT in the remote past.  Patient disabled due to recurrent depression and chronic pain.   Migraine syndrome    Mixed dyslipidemia 10/30/2018   Morbid obesity (HCC) 10/27/2011   Nondisplaced fracture of medial cuneiform of left foot, initial encounter for closed fracture 11/16/2018   Osteoarthritis 10/30/2018   Personal history of tobacco use, presenting hazards to health 04/04/2013   Presence of permanent cardiac pacemaker    Severe single current episode of major depressive disorder, without psychotic features (HCC) 11/17/2016   Status post bariatric surgery 04/05/2011   bipass surg --presurg wt 310 lbs   Syncope 02/14/2019   Vitamin D  deficiency 10/30/2018    MEDICATIONS: Current Outpatient Medications on File Prior to Visit  Medication Sig Dispense Refill   aspirin  EC 81 MG tablet Take 81 mg by mouth daily.     atorvastatin  (LIPITOR) 20 MG tablet Take 20 mg by mouth daily at 6 PM.      buPROPion  (WELLBUTRIN  SR) 200 MG 12 hr tablet Take 200 mg by mouth 2 (two) times daily.     busPIRone  (BUSPAR ) 10 MG tablet Take 20 mg by mouth 2 (two) times daily.     CALCIUM  CITRATE PO Take 500 mg by mouth 2 (two) times daily.     Cholecalciferol  (VITAMIN D -3 PO) Take 5,000 capsules by mouth daily.     clobetasol (TEMOVATE) 0.05 % external solution Apply 1 Application topically as needed.     clonazePAM  (KLONOPIN ) 0.5 MG tablet Take 0.5 mg by mouth 2 (two) times daily as needed for anxiety.     diclofenac Sodium (VOLTAREN) 1 % GEL Apply 1 application  topically as needed.     dicyclomine  (BENTYL) 20 MG tablet Take 20 mg by mouth 4 (four) times daily as needed.     docusate sodium  (COLACE) 100 MG capsule Take 1 capsule (100 mg total) by mouth 2 (two) times daily. 10 capsule 0   DULoxetine  (CYMBALTA ) 60 MG capsule Take 60 mg by mouth 2 (two) times daily.      Erenumab -aooe (AIMOVIG ) 140 MG/ML SOAJ Inject 140 mg into the skin every 28 (twenty-eight) days. (Patient not taking: Reported on 10/10/2023) 1 mL 11   Erenumab -aooe (AIMOVIG ) 140 MG/ML SOAJ Inject 140 mg into the skin every 28 (twenty-eight) days. 3 mL 3   ferrous sulfate  325 (65 FE) MG EC tablet Take 325 mg by mouth daily.     finasteride  (PROSCAR ) 5 MG tablet Take 5 mg by mouth daily.     fluticasone  (FLONASE ) 50 MCG/ACT nasal  spray Place 2 sprays into the nose daily as needed for allergies or rhinitis.     folic acid  (FOLVITE ) 1 MG tablet Take 1 mg by mouth daily.     furosemide  (LASIX ) 20 MG tablet Take 1 tablet (20 mg total) by mouth as needed (for leg swelling). 30 tablet 4   hydrALAZINE  (APRESOLINE ) 10 MG tablet Take 1 tablet three times daily for systolic bp > 140 or diastolic bp > 90 (Patient taking differently: 25 mg. Take 1 tablet three times daily for systolic bp > 140 or diastolic bp > 90) 270 tablet 3   levothyroxine  (SYNTHROID ) 125 MCG tablet Take 125 mcg by mouth daily.      lidocaine  (LIDODERM ) 5 % as needed.     loratadine  (CLARITIN ) 10 MG tablet Take 10 mg by mouth daily as needed for allergies.     MAGNESIUM  PO Take 500 mg by mouth in the morning and at bedtime.     meclizine (ANTIVERT) 25 MG tablet Take 25 mg by mouth 3 (three) times daily as needed for dizziness.     metFORMIN  (GLUCOPHAGE ) 1000 MG tablet Take 1,000 mg by mouth 2 (two) times daily with a meal.     Multiple Vitamins-Minerals (MULTIVITAMIN WITH MINERALS) tablet Take 1 tablet by mouth daily.     Multiple Vitamins-Minerals (PRESERVISION/LUTEIN) CAPS Take 1 capsule by mouth at bedtime. (Patient not taking: Reported on 10/10/2023)     mupirocin   ointment (BACTROBAN ) 2 % Apply 1 application topically 3 (three) times daily as needed (infection).     naloxone  (NARCAN ) nasal spray 4 mg/0.1 mL Instill or spray into the nose if you are experiencing shortness of breath or signs of opioid overdose. 1 each 0   omeprazole (PRILOSEC) 20 MG capsule Take 20 mg by mouth 2 (two) times daily before a meal.      oxyCODONE -acetaminophen  (PERCOCET) 10-325 MG tablet Take 0.5 tablets by mouth See admin instructions. 1/2 tablet every 3 hours.     polyethylene glycol (MIRALAX  / GLYCOLAX ) 17 g packet Take 17 g by mouth daily as needed.     potassium chloride  (KLOR-CON ) 10 MEQ tablet Take 10 mEq by mouth as needed (with lasix  dose).     pregabalin  (LYRICA ) 100 MG capsule Take 100 mg by mouth 3 (three) times daily.     Semaglutide ,0.25 or 0.5MG /DOS, (OZEMPIC , 0.25 OR 0.5 MG/DOSE,) 2 MG/3ML SOPN Inject 0.5 mg into the skin once a week. 3 mL 3   silodosin (RAPAFLO) 8 MG CAPS capsule Take 8 mg by mouth daily.     solifenacin (VESICARE) 10 MG tablet Take 10 mg by mouth at bedtime.     tiZANidine  (ZANAFLEX ) 4 MG tablet Take 4 mg by mouth 3 (three) times daily.     traMADol  (ULTRAM ) 50 MG tablet Take 50 mg by mouth 3 (three) times daily.     Ubrogepant  (UBRELVY ) 100 MG TABS Take 1 tablet (100 mg total) by mouth as needed (May repeat in 2 hours.  Maximum 2 tablets in 24 hours.). 48 tablet 3   No current facility-administered medications on file prior to visit.    ALLERGIES: Allergies  Allergen Reactions   Calcium  Carbonate Other (See Comments)    Pt had gastric bypass, can not absorb calcium  carbonate - able to absorb calcium  citrate   Ms Contin [Morphine] Itching    Can take with Benadryl , facial and nose itching     FAMILY HISTORY: Family History  Problem Relation Age of Onset   Cancer  Mother        breast   Rheum arthritis Mother    Diabetes Mother    Heart disease Father    Kidney disease Father    Diabetes Father    Hypertension Brother     Diabetes Brother    Osteoarthritis Brother    Heart disease Brother    Hypertension Brother    Diabetes Brother    Rheum arthritis Brother    Depression Daughter    Anxiety disorder Daughter       Objective:  Blood pressure 134/84, pulse 80, resp. rate 18, height 6\' 3"  (1.905 m), weight 188 lb (85.3 kg), SpO2 98%. General: No acute distress.  Patient appears well-groomed.   Head:  Normocephalic/atraumatic Neck:  Supple.  No paraspinal tenderness.  Full range of motion. Heart:  Regular rate and rhythm. Neuro:  Alert and oriented.  Speech fluent and not dysarthric.  Language intact.  CN II-XII intact.  Bulk and tone normal.  Muscle strength 5/5 upper extremities, 4/5 lower extremities  Deep tendon reflexes 2+ throughout.  Gait deferred.  In wheelchair.    Janne Members, DO  CC:  Angelique Barer, MD

## 2023-10-17 ENCOUNTER — Encounter: Payer: Self-pay | Admitting: Neurology

## 2023-10-17 ENCOUNTER — Ambulatory Visit: Payer: Medicare PPO

## 2023-10-17 ENCOUNTER — Ambulatory Visit: Payer: Medicare PPO | Admitting: Neurology

## 2023-10-17 VITALS — BP 134/84 | HR 80 | Resp 18 | Ht 75.0 in | Wt 188.0 lb

## 2023-10-17 DIAGNOSIS — G901 Familial dysautonomia [Riley-Day]: Secondary | ICD-10-CM | POA: Diagnosis not present

## 2023-10-17 DIAGNOSIS — I442 Atrioventricular block, complete: Secondary | ICD-10-CM | POA: Diagnosis not present

## 2023-10-17 DIAGNOSIS — G43009 Migraine without aura, not intractable, without status migrainosus: Secondary | ICD-10-CM | POA: Diagnosis not present

## 2023-10-17 MED ORDER — EMGALITY 120 MG/ML ~~LOC~~ SOAJ: 240.0000 mg | Freq: Once | SUBCUTANEOUS | 0 refills | Status: AC

## 2023-10-17 NOTE — Patient Instructions (Addendum)
 Stop Aimovig .  Start Emgality , 2 injections for first dose, then 1 injection every 28 days thereafter Ubrelvy  as needed.  If we get approval, make sure first dose has 2 pens.  Let me know when you pick up the first dose and I will send in prescription for standing order of 1 pen every 28 days Ask to be put on cancellation list with Dr. Harlin Life at [facility] Follow up 5 months.

## 2023-10-18 LAB — CUP PACEART REMOTE DEVICE CHECK
Battery Remaining Longevity: 55 mo
Battery Remaining Percentage: 45 %
Battery Voltage: 2.99 V
Brady Statistic AP VP Percent: 3.4 %
Brady Statistic AP VS Percent: 1 %
Brady Statistic AS VP Percent: 8.4 %
Brady Statistic AS VS Percent: 86 %
Brady Statistic RA Percent Paced: 1 %
Brady Statistic RV Percent Paced: 12 %
Date Time Interrogation Session: 20250210062127
Implantable Lead Connection Status: 753985
Implantable Lead Connection Status: 753985
Implantable Lead Implant Date: 20180710
Implantable Lead Implant Date: 20180710
Implantable Lead Location: 753859
Implantable Lead Location: 753860
Implantable Pulse Generator Implant Date: 20180710
Lead Channel Impedance Value: 450 Ohm
Lead Channel Impedance Value: 480 Ohm
Lead Channel Pacing Threshold Amplitude: 0.5 V
Lead Channel Pacing Threshold Amplitude: 0.75 V
Lead Channel Pacing Threshold Pulse Width: 0.4 ms
Lead Channel Pacing Threshold Pulse Width: 0.4 ms
Lead Channel Sensing Intrinsic Amplitude: 0.8 mV
Lead Channel Sensing Intrinsic Amplitude: 2.3 mV
Lead Channel Setting Pacing Amplitude: 1 V
Lead Channel Setting Pacing Amplitude: 2 V
Lead Channel Setting Pacing Pulse Width: 0.4 ms
Lead Channel Setting Sensing Sensitivity: 0.5 mV
Pulse Gen Model: 2272
Pulse Gen Serial Number: 8912213

## 2023-10-31 ENCOUNTER — Other Ambulatory Visit (HOSPITAL_COMMUNITY): Payer: Self-pay

## 2023-10-31 ENCOUNTER — Other Ambulatory Visit: Payer: Self-pay

## 2023-10-31 MED ORDER — EMGALITY 120 MG/ML ~~LOC~~ SOAJ
240.0000 mg | Freq: Once | SUBCUTANEOUS | 0 refills | Status: DC
  Filled 2023-10-31: qty 2, 30d supply, fill #0

## 2023-11-08 ENCOUNTER — Other Ambulatory Visit (HOSPITAL_COMMUNITY): Payer: Self-pay

## 2023-11-14 ENCOUNTER — Other Ambulatory Visit (HOSPITAL_COMMUNITY): Payer: Self-pay

## 2023-11-20 ENCOUNTER — Other Ambulatory Visit: Payer: Self-pay | Admitting: Cardiology

## 2023-11-23 ENCOUNTER — Encounter: Payer: Medicare PPO | Admitting: Physical Medicine & Rehabilitation

## 2023-11-23 ENCOUNTER — Ambulatory Visit: Payer: Medicare PPO | Admitting: Neurology

## 2023-11-28 ENCOUNTER — Telehealth: Payer: Self-pay | Admitting: Cardiology

## 2023-11-28 NOTE — Telephone Encounter (Signed)
 STAT if HR is under 50 or over 120 (normal HR is 60-100 beats per minute)  What is your heart rate?   Below 45  Do you have a log of your heart rate readings (document readings)?   Yes, but wife not with patient at this time  Do you have any other symptoms?  Lightheaded, weakness, hypotension,   Wife Antonio Oliver) stated patient has been having alerts on his smart watch that his HR is going low - below 45 bpm.  Wife noted this has happened 4 times in about the last week.  BP wife stated his systolic and diastolic is 20 points apart - 846/962 (sometime yesterday).  Wife wants a call back to discuss next steps.

## 2023-11-28 NOTE — Addendum Note (Signed)
 Addended by: Geralyn Flash D on: 11/28/2023 12:20 PM   Modules accepted: Orders

## 2023-11-28 NOTE — Progress Notes (Signed)
 Remote pacemaker transmission.

## 2023-11-28 NOTE — Telephone Encounter (Signed)
 Spoke with patient's wife. He has had low HR readings, episodically, for past 7-10 days. HR alerts of under 45bpm for >10 minutes. Patient has been weak, unstable, dizzy. Low HR readings occur throughout the day.   Wife is in a doctor's appt and needed to get off phone.   Will reach out to device team to see if report can be pulled from pacemaker.

## 2023-11-29 NOTE — Telephone Encounter (Signed)
 Attempted to call back. The phone answered but unable to hear anyone. Will retry.

## 2023-11-29 NOTE — Telephone Encounter (Signed)
 Attempted to contact patient. No answer, left message to call back.  Remote transmission received 11/29/23. Presenting rhythm ~ AS/VS PVC's (trigeminy). This is not a new finding for patient. Noise noted on RV lead, not a new finding.

## 2023-12-02 NOTE — Telephone Encounter (Signed)
 Outreach made to Pt.  Spoke with Pt's wife per DPR.  Discussed recent transmission received.  Advised presenting rhythm was trigeminy which could cause symptoms described (lightheaded, weakness).  Pt is unable to take beta blockers d/t hypotension.  Per wife, Pt symptomatic when he is having PVC's and is "bedbound" during these times.    Dr. Elberta Fortis recently ordered a heart monitor to evaluate PVC burden, but per wife monitor would not stick to Pt's skin.    Advised wife would discuss with Dr. Elberta Fortis to see if any alternatives available for PVC treatment for Pt.

## 2023-12-05 ENCOUNTER — Other Ambulatory Visit: Payer: Self-pay | Admitting: Cardiology

## 2023-12-05 NOTE — Telephone Encounter (Signed)
 Spoke to wife, pt scheduled to see Dr. Elberta Fortis tomorrow to discuss further.

## 2023-12-06 ENCOUNTER — Encounter: Payer: Self-pay | Admitting: Cardiology

## 2023-12-06 ENCOUNTER — Ambulatory Visit: Attending: Cardiology | Admitting: Cardiology

## 2023-12-06 VITALS — BP 140/64 | HR 81 | Ht 75.0 in | Wt 191.0 lb

## 2023-12-06 DIAGNOSIS — I493 Ventricular premature depolarization: Secondary | ICD-10-CM | POA: Diagnosis not present

## 2023-12-06 DIAGNOSIS — Z952 Presence of prosthetic heart valve: Secondary | ICD-10-CM | POA: Diagnosis not present

## 2023-12-06 DIAGNOSIS — I442 Atrioventricular block, complete: Secondary | ICD-10-CM | POA: Diagnosis not present

## 2023-12-06 DIAGNOSIS — Z950 Presence of cardiac pacemaker: Secondary | ICD-10-CM

## 2023-12-06 DIAGNOSIS — I10 Essential (primary) hypertension: Secondary | ICD-10-CM

## 2023-12-06 NOTE — Progress Notes (Signed)
  Electrophysiology Office Note:   Date:  12/06/2023  ID:  Antonio Oliver, DOB [birth year], MRN [id number]  Primary Cardiologist: Olga Millers, MD Primary Heart Failure: None Electrophysiologist: Stacia Feazell Jorja Loa, MD      History of Present Illness:   Antonio Oliver is a 68 y.o. male with h/o aortic valve replacement and aortic aneurysm repair, complete heart block, diabetes, obesity, hyperlipidemia seen today for routine electrophysiology followup.   Since last being seen in our clinic the patient reports creasing fatigue, weakness, shortness of breath.  He is having PVCs in clinic today, and PVCs on his device interrogation.  He has checked his heart rates at home and it is down into the 40s.  He does not have palpitations, but does have the above symptoms which makes him feel quite poorly most of the time.  he denies chest pain, palpitations, PND, orthopnea, nausea, vomiting, dizziness, syncope, edema, weight gain, or early satiety.   Review of systems complete and found to be negative unless listed in HPI.      EP Information / Studies Reviewed:    EKG is ordered today. Personal review as below.  EKG Interpretation Date/Time:  Tuesday December 06 2023 14:28:50 EDT Ventricular Rate:  80 PR Interval:  206 QRS Duration:  166 QT Interval:  424 QTC Calculation: 489 R Axis:   -46  Text Interpretation: Sinus rhythm with frequent Premature ventricular complexes Right bundle branch block Left anterior fascicular block Bifascicular block When compared with ECG of 27-Sep-2023 11:04, No significant change since last tracing Confirmed by Garnet Chatmon ([id number]) on 12/06/2023 2:32:08 PM   PPM Interrogation-  reviewed in detail today,  See PACEART report.  Device History: Abbott Dual Chamber PPM implanted 2018 for CHB  Risk Assessment/Calculations:             Physical Exam:   VS:  BP (!) 140/64 (BP Location: Left Arm, Patient Position: Sitting, Cuff Size: Large)   Pulse 81   Ht 6\' 3"   (1.905 m)   Wt 191 lb (86.6 kg)   SpO2 96%   BMI 23.87 kg/m    Wt Readings from Last 3 Encounters:  12/06/23 191 lb (86.6 kg)  10/17/23 188 lb (85.3 kg)  10/10/23 188 lb 1.9 oz (85.3 kg)     GEN: Well nourished, well developed in no acute distress NECK: No JVD; No carotid bruits CARDIAC: Regular rate and rhythm with occasional ectopy, no murmurs, rubs, gallops RESPIRATORY:  Clear to auscultation without rales, wheezing or rhonchi  ABDOMEN: Soft, non-tender, non-distended EXTREMITIES:  No edema; No deformity   ASSESSMENT AND PLAN:    CHB s/p Abbott PPM  Normal PPM function See Pace Art report Sensing, Threshold, impedance within normal limits Programming reviewed and appropriate for patient care No changes today  2.  Status post AVR: Stable on recent echoes.  Plan per primary cardiology.  3.  Thoracic aorta aneurysm: Postrepair.  Plan per primary cardiology.  4.  Hypertension: Mildly elevated today.  Usually well-controlled.  No changes.  5.  PVCs: Have a high burden of PVCs based on his EKGs.  He has a monitor at home that he Jaedyn Marrufo wear.  Once we receive the results of the monitor, we Shae Hinnenkamp plan for a rhythm control strategy.  He would likely benefit from mexiletine.  Disposition:   Follow up with Dr. Elberta Fortis  pending monitor results   Signed, Dreyah Montrose Jorja Loa, MD

## 2023-12-07 ENCOUNTER — Telehealth: Payer: Self-pay | Admitting: Cardiology

## 2023-12-07 DIAGNOSIS — I340 Nonrheumatic mitral (valve) insufficiency: Secondary | ICD-10-CM

## 2023-12-07 DIAGNOSIS — I7121 Aneurysm of the ascending aorta, without rupture: Secondary | ICD-10-CM

## 2023-12-07 NOTE — Telephone Encounter (Signed)
 Returned spouse's phone call. Left message to call back. Call back number provided.  Josie LPN

## 2023-12-07 NOTE — Telephone Encounter (Signed)
 Pt's wife requesting a c/b regarding test that was scheduled to be done on 4/3. Please advise

## 2023-12-08 NOTE — Telephone Encounter (Signed)
 Pt's wife calling back in regards to an Echo order being put in for pt. She states that pt has this done annually due to an Aneurism that pt has. She would like a c/b once this has been done. Please advise

## 2023-12-08 NOTE — Telephone Encounter (Signed)
 Patient identification verified by 2 forms. Marilynn Rail, RN    Called and spoke to patients wife Dolores Patty states:   -during OV in February Dr. Jens Som recommended repeat Echo annually  Informed susan new order placed, she will be outreached for scheduling  Darl Pikes verbalized understanding, no questions at this time

## 2023-12-08 NOTE — Telephone Encounter (Signed)
 Patient identification verified by 2 forms. Marilynn Rail, RN    Called and spoke to patients wife Dolores Patty states:   -patient has annual CT   -thought CT was ordered/scheduled for this month  Informed Darl Pikes no CT scheduled, no order placed today for [facility] location  Darl Pikes verbalized understanding, no questions at this time

## 2023-12-08 NOTE — Addendum Note (Signed)
 Addended by: Marilynn Rail on: 12/08/2023 12:56 PM   Modules accepted: Orders

## 2023-12-12 ENCOUNTER — Ambulatory Visit (HOSPITAL_BASED_OUTPATIENT_CLINIC_OR_DEPARTMENT_OTHER)

## 2023-12-12 DIAGNOSIS — I340 Nonrheumatic mitral (valve) insufficiency: Secondary | ICD-10-CM

## 2023-12-12 DIAGNOSIS — Z952 Presence of prosthetic heart valve: Secondary | ICD-10-CM | POA: Diagnosis not present

## 2023-12-12 LAB — ECHOCARDIOGRAM COMPLETE
AR max vel: 0.88 cm2
AV Area VTI: 0.95 cm2
AV Area mean vel: 0.83 cm2
AV Mean grad: 26 mmHg
AV Peak grad: 47.9 mmHg
Ao pk vel: 3.46 m/s
Area-P 1/2: 4.6 cm2
S' Lateral: 2.38 cm

## 2023-12-15 ENCOUNTER — Telehealth: Payer: Self-pay | Admitting: *Deleted

## 2023-12-15 NOTE — Telephone Encounter (Addendum)
 Left message for pt to call  Patient has a follow up appointment in June and per dr Jens Som, if he is not having symptoms, that appointment will be fine.

## 2023-12-15 NOTE — Telephone Encounter (Signed)
-----   Message from Antonio Oliver sent at 12/13/2023 12:33 AM EDT ----- Normal LV function; moderate MR; Elevated gradient across prosthetic aortic valve; make sure pt has fuov with me Antonio Oliver ----- Message ----- From: Interface, Three One Seven Sent: 12/12/2023   9:22 PM EDT To: Lewayne Bunting, MD

## 2023-12-16 NOTE — Telephone Encounter (Signed)
 Patient identification verified by 2 forms. Marilynn Rail, RN    Called and spoke to patients wife Arnette Schaumann provider result message below  Darl Pikes states:   -patient does not currently have OV scheduled for June   -CT appointment is scheduled for 4/22 Informed Darl Pikes message sent regarding follow up, at this time next available with Dr. Jens Som is July Susan verbalized understanding, no questions at this time

## 2023-12-16 NOTE — Telephone Encounter (Signed)
 Pt's returning nurses call from yesterday. Please advise

## 2023-12-19 NOTE — Telephone Encounter (Signed)
 Spoke with pt wife, she reports the patient is having episodes of weakness, dizziness and low heart rate. He takes his zio monitor off today. Follow up scheduled with dr Audery Blazing.

## 2023-12-19 NOTE — Telephone Encounter (Signed)
 Left message for pt to call.

## 2023-12-21 NOTE — H&P (View-Only) (Signed)
 HPI: Follow-up history of aortic valve replacement and thoracic aortic aneurysm. Patient has a history of bicuspid aortic valve and underwent aortic valve replacement March 10, 2017.  Procedure was complicated by complete heart block requiring pacemaker placement.  Note preoperative cardiac catheterization revealed no coronary disease. Nuclear study August 2022 showed no ischemia. Renal Dopplers May 2023 showed 1 to 59% right and no stenosis on the left.  Carotid Dopplers March 2024 near normal on the right and left.  Echo 4/25 showed normal LV function; mild RAE/RVE; moderate MR; s/p AVR with newly elevated mean gradient 26 mmHg; dilated aortic root (44 mm). CTA of thoracic aorta 4/25 with results pending. Since last seen his autonomic dysfunction persist.  He also has dyspnea when his blood pressure drops.  His dyspnea is worse over the past 2 months.  There is no orthopnea, PND, pedal edema, chest pain or syncope.  Current Outpatient Medications  Medication Sig Dispense Refill   amoxicillin  (AMOXIL ) 500 MG capsule TAKE 4 CAPSULES BY MOUTH 1 HOUR PRIOR TO DENTAL APPOINTMENT 4 capsule 0   aspirin  EC 81 MG tablet Take 81 mg by mouth daily.     atorvastatin  (LIPITOR) 20 MG tablet Take 20 mg by mouth daily at 6 PM.      baclofen (LIORESAL) 10 MG tablet Take 10 mg by mouth 3 (three) times daily.     buPROPion  (WELLBUTRIN  SR) 200 MG 12 hr tablet Take 200 mg by mouth 2 (two) times daily.     busPIRone  (BUSPAR ) 10 MG tablet Take 20 mg by mouth 2 (two) times daily.     CALCIUM  CITRATE PO Take 500 mg by mouth 2 (two) times daily.     Cholecalciferol  (VITAMIN D -3 PO) Take 5,000 capsules by mouth daily.     clobetasol (TEMOVATE) 0.05 % external solution Apply 1 Application topically as needed.     clonazePAM  (KLONOPIN ) 0.5 MG tablet Take 0.5 mg by mouth 2 (two) times daily as needed for anxiety.     diclofenac Sodium (VOLTAREN) 1 % GEL Apply 1 application  topically as needed.     dicyclomine (BENTYL) 20  MG tablet Take 20 mg by mouth 4 (four) times daily as needed.     docusate sodium  (COLACE) 100 MG capsule Take 1 capsule (100 mg total) by mouth 2 (two) times daily. 10 capsule 0   DULoxetine  (CYMBALTA ) 60 MG capsule Take 60 mg by mouth 2 (two) times daily.      ferrous sulfate  325 (65 FE) MG EC tablet Take 325 mg by mouth daily.     finasteride  (PROSCAR ) 5 MG tablet Take 5 mg by mouth daily.     fluticasone  (FLONASE ) 50 MCG/ACT nasal spray Place 2 sprays into the nose daily as needed for allergies or rhinitis.     folic acid  (FOLVITE ) 1 MG tablet Take 1 mg by mouth daily.     furosemide  (LASIX ) 20 MG tablet Take 1 tablet (20 mg total) by mouth as needed (for leg swelling). 30 tablet 4   Galcanezumab -gnlm (EMGALITY ) 120 MG/ML SOAJ Inject 120 mg into the skin every 28 (twenty-eight) days. 1 mL 5   hydrALAZINE  (APRESOLINE ) 10 MG tablet Take 1 tablet BY MOUTH three times daily for systolic bp > 140 or diastolic bp > 90 782 tablet 3   insulin  lispro (HUMALOG) 100 UNIT/ML injection as directed Injection as needed     levothyroxine  (SYNTHROID ) 125 MCG tablet Take 125 mcg by mouth daily.  lidocaine  (LIDODERM ) 5 % as needed.     loratadine  (CLARITIN ) 10 MG tablet Take 10 mg by mouth daily as needed for allergies.     Magnesium  250 MG TABS 2 tablets (500mg ) Orally Twice a day     MAGNESIUM  PO Take 500 mg by mouth in the morning and at bedtime.     meclizine (ANTIVERT) 25 MG tablet Take 25 mg by mouth 3 (three) times daily as needed for dizziness.     metFORMIN  (GLUCOPHAGE ) 1000 MG tablet Take 1,000 mg by mouth 2 (two) times daily with a meal.     mirtazapine  (REMERON ) 30 MG tablet 1 tablet at bedtime Orally Once a day     Multiple Vitamins-Minerals (MULTIVITAMIN WITH MINERALS) tablet Take 1 tablet by mouth daily.     Multiple Vitamins-Minerals (PRESERVISION/LUTEIN) CAPS Take 1 capsule by mouth at bedtime.     mupirocin  ointment (BACTROBAN ) 2 % Apply 1 application topically 3 (three) times daily as  needed (infection).     naloxone  (NARCAN ) nasal spray 4 mg/0.1 mL Instill or spray into the nose if you are experiencing shortness of breath or signs of opioid overdose. 1 each 0   omeprazole (PRILOSEC) 20 MG capsule Take 20 mg by mouth 2 (two) times daily before a meal.      oxyCODONE -acetaminophen  (PERCOCET) 10-325 MG tablet Take 0.5 tablets by mouth See admin instructions. 1/2 tablet every 3 hours.     polyethylene glycol (MIRALAX  / GLYCOLAX ) 17 g packet Take 17 g by mouth daily as needed.     potassium chloride  (KLOR-CON ) 10 MEQ tablet Take 10 mEq by mouth as needed (with lasix  dose).     potassium chloride  SA (KLOR-CON  M) 20 MEQ tablet Take 20 mEq by mouth as needed.     pregabalin  (LYRICA ) 100 MG capsule Take 100 mg by mouth 3 (three) times daily.     pseudoephedrine (SUDAFED) 30 MG tablet Take 30 mg by mouth every 6 (six) hours as needed.     Semaglutide ,0.25 or 0.5MG /DOS, (OZEMPIC , 0.25 OR 0.5 MG/DOSE,) 2 MG/3ML SOPN Inject 0.5 mg into the skin once a week. 3 mL 3   silodosin (RAPAFLO) 8 MG CAPS capsule Take 8 mg by mouth daily.     solifenacin (VESICARE) 10 MG tablet Take 10 mg by mouth at bedtime.     tiZANidine  (ZANAFLEX ) 4 MG tablet Take 4 mg by mouth 3 (three) times daily.     traMADol  (ULTRAM ) 50 MG tablet Take 50 mg by mouth 3 (three) times daily.     Ubrogepant  (UBRELVY ) 100 MG TABS Take 1 tablet (100 mg total) by mouth as needed (May repeat in 2 hours.  Maximum 2 tablets in 24 hours.). 48 tablet 3   No current facility-administered medications for this visit.     Past Medical History:  Diagnosis Date   Anxiety and depression    Ascending aorta dilation (HCC) 03/06/2019   4.3 cm 2022   Benign prostatic hyperplasia without lower urinary tract symptoms 11/17/2016   Last Assessment & Plan:  Stable PSA today.  Formatting of this note might be different from the original. Last Assessment & Plan:  Stable PSA today. Last Assessment & Plan:  Formatting of this note might be  different from the original. Stable PSA today.   BPH (benign prostatic hyperplasia)    Bradycardia 02/01/2019   Cervical myelopathy (HCC) 11/14/2018   Charcot's joint of foot 03/05/2019   Chronic back pain 04/03/2013   Chronic inflammatory arthritis 11/17/2016  History of positive rheumatoid factor in the past. Denies placement on immunosuppressive therapy   Chronic pain syndrome    Failed back surgical syndrome.  Chronic neck pain.  Chronic low back pain with sciatica bilateral   Colon cancer screening    2023 cologuard neg   Complete heart block (HCC)    Following AVR s/p [employer] PPM in 03/2017   DM2 (diabetes mellitus, type 2) (HCC) 04/03/2013   Last Assessment & Plan:  Formatting of this note might be different from the original. Diabetes is unchanged.  Continue current treatment regimen. Regular aerobic exercise. Diabetes will be reassessed in 3 months. Will check A1c today. Denies any problems with feet or sensation.   Elevated PSA 11/2021   Enthesopathy of ankle and tarsus 04/04/2013   Last Assessment & Plan:  Continues with swelling after prolonged standing.  Still  Wearing support hose, which helps.   Essential hypertension 10/30/2018   FHx: migraine headaches 04/04/2013   Fracture of capitate bone of wrist 02/26/2019   Fracture of tibia 03/05/2019   GERD (gastroesophageal reflux disease) 10/30/2018   Glaucoma 04/03/2013   Hemangioma of skin and subcutaneous tissue 07/07/2017   History of aortic valve replacement 2018   bicuspid AV   History of kidney stones 11/17/2016   History of sleep apnea    wt loss->gone   Hypothyroidism 10/30/2018   IDA (iron deficiency anemia) 11/2021   malabs d/t gastric bipass   Lesion of plantar nerve 04/04/2013   Lumbar stenosis with neurogenic claudication 01/08/2021   Major depressive disorder, recurrent (HCC)    ECT in the remote past.  Patient disabled due to recurrent depression and chronic pain.   Migraine syndrome    Mixed  dyslipidemia 10/30/2018   Morbid obesity (HCC) 10/27/2011   Nondisplaced fracture of medial cuneiform of left foot, initial encounter for closed fracture 11/16/2018   Osteoarthritis 10/30/2018   Personal history of tobacco use, presenting hazards to health 04/04/2013   Presence of permanent cardiac pacemaker    Severe single current episode of major depressive disorder, without psychotic features (HCC) 11/17/2016   Status post bariatric surgery 04/05/2011   bipass surg --presurg wt 310 lbs   Syncope 02/14/2019   Vitamin D  deficiency 10/30/2018    Past Surgical History:  Procedure Laterality Date   ANTERIOR CERVICAL DECOMP/DISCECTOMY FUSION N/A 11/17/2018   Procedure: CERVICAL THREE-CERVICAL FOUR, CERVICAL FOUR-CERVICAL FIVE, CERVICAL FIVE-CERVICAL SIX ANTERIOR CERVICAL DECOMPRESSION/DISCECTOMY FUSION;  Surgeon: Agustina Aldrich, MD;  Location: [facility] OR;  Service: Neurosurgery;  Laterality: N/A;   AORTIC VALVE REPLACEMENT  03/2017   APPLICATION OF WOUND VAC Left 04/05/2019   Procedure: Application Of Wound Vac;  Surgeon: Donnamarie Gables, MD;  Location: [facility] OR;  Service: Orthopedics;  Laterality: Left;   BACK SURGERY     x4   CARDIOVASCULAR STRESS TEST  05/05/2021   lexiscan  neg   FASCIOTOMY Left 04/05/2019   Left leg 2 compartment fasciotomy   FASCIOTOMY Left 04/05/2019   Procedure: FASCIOTOMY LEFT LOWER LEG;  Surgeon: Donnamarie Gables, MD;  Location: [facility] OR;  Service: Orthopedics;  Laterality: Left;   FOOT NEUROMA SURGERY     GASTRIC BYPASS  2012   HEMATOMA EVACUATION Left 04/05/2019   Procedure: Evacuation Hematoma Left Lower Leg;  Surgeon: Donnamarie Gables, MD;  Location: [facility] OR;  Service: Orthopedics;  Laterality: Left;   HEMORRHOID SURGERY     over 30 years ago   HERNIA REPAIR     LIH umb   I &  D EXTREMITY Left 04/13/2022   Procedure: IRRIGATION AND DEBRIDEMENT OF ELBOW AND BURSECTOMY;  Surgeon: Arvil Birks, MD;  Location: [facility] OR;  Service: Orthopedics;  Laterality: Left;    LUMBAR LAMINECTOMY/DECOMPRESSION MICRODISCECTOMY N/A 01/08/2021   Procedure: CENTRAL LUMBAR LAMINECTOMY LUMBAR TWO-THREE, LUMBAR THREE-FOUR;  Surgeon: Alphonso Jean, MD;  Location: [facility] OR;  Service: Orthopedics;  Laterality: N/A;   NASAL SINUS SURGERY     x2   NECK SURGERY     OLECRANON BURSECTOMY  2023   d/t septic bursitis   TRANSTHORACIC ECHOCARDIOGRAM  12/2020   EF normal, mod MR, ascending AA (4.4 cm)    Social History   Socioeconomic History   Marital status: Married    Spouse name: susan    Number of children: 1   Years of education: Not on file   Highest education level: Bachelor's degree (e.g., BA, AB, BS)  Occupational History   Not on file  Tobacco Use   Smoking status: Never   Smokeless tobacco: Never  Vaping Use   Vaping status: Never Used  Substance and Sexual Activity   Alcohol use: Not Currently    Alcohol/week: 1.0 standard drink of alcohol    Types: 1 Glasses of wine per week    Comment: occ.   Drug use: No   Sexual activity: Not on file  Other Topics Concern   Not on file  Social History Narrative   Patient lives at home with spouse Amalia Badder   Educ: BS   Occup: retired Banker.  Disabled due to depression and pain.   Tobacco: None   Alc: none   Right handed   Social Drivers of Corporate investment banker Strain: Low Risk  (05/30/2019)   Received from [facility], [facility]   Overall Financial Resource Strain (CARDIA)    Difficulty of Paying Living Expenses: Not hard at all  Food Insecurity: No Food Insecurity (05/30/2019)   Received from [facility], [facility]   Hunger Vital Sign    Worried About Running Out of Food in the Last Year: Never true    Ran Out of Food in the Last Year: Never true  Transportation Needs: Unmet Transportation Needs (05/30/2019)   Received from [facility], [facility]   PRAPARE - Transportation    Lack of Transportation (Medical): Yes    Lack of Transportation (Non-Medical): Yes   Physical Activity: Not on file  Stress: Not on file  Social Connections: Unknown (01/06/2023)   Received from [facility], [facility]   Social Network    Social Network: Not on file  Intimate Partner Violence: Unknown (01/06/2023)   Received from [employer], [facility]   HITS    Physically Hurt: Not on file    Insult or Talk Down To: Not on file    Threaten Physical Harm: Not on file    Scream or Curse: Not on file    Family History  Problem Relation Age of Onset   Cancer Mother        breast   Rheum arthritis Mother    Diabetes Mother    Heart disease Father    Kidney disease Father    Diabetes Father    Hypertension Brother    Diabetes Brother    Osteoarthritis Brother    Heart disease Brother    Hypertension Brother    Diabetes Brother    Rheum arthritis Brother    Depression Daughter    Anxiety disorder Daughter  ROS: no fevers or chills, productive cough, hemoptysis, dysphasia, odynophagia, melena, hematochezia, dysuria, hematuria, rash, seizure activity, orthopnea, PND, pedal edema, claudication. Remaining systems are negative.  Physical Exam: Well-developed well-nourished in no acute distress.  Skin is warm and dry.  HEENT is normal.  Neck is supple.  Chest is clear to auscultation with normal expansion.  Cardiovascular exam is regular rate and rhythm.  Abdominal exam nontender or distended. No masses palpated. Extremities show no edema. neuro with history of autonomic dysfunction  A/P  1 status post aortic valve replacement-continue SBE prophylaxis. FU echo shows newly increased gradient across prosthetic aortic valve.  I will arrange a transesophageal echocardiogram to further assess.  Note he has had some increased dyspnea when his blood pressure drops secondary to autonomic dysfunction.  Not clear to me that this would be valve related but we will investigate further as outlined.  2 history of thoracic aortic aneurysm-await follow-up CTA  April 2025.  3 hypertension-patient's blood pressure continues to run low.  He is being evaluated by neurology.  4 history of mitral regurgitation-pt will need follow-up echocardiograms in the future.   5 hyperlipidemia-continue statin.  6 pacemaker-per electrophysiology.  7 history of palpitations-beta-blocker was discontinued previously secondary to hypotension.  8 weakness/dysautonomia-being evaluated at [facility].  Alexandria Angel, MD

## 2023-12-21 NOTE — Progress Notes (Signed)
 HPI: Follow-up history of aortic valve replacement and thoracic aortic aneurysm. Patient has a history of bicuspid aortic valve and underwent aortic valve replacement March 10, 2017.  Procedure was complicated by complete heart block requiring pacemaker placement.  Note preoperative cardiac catheterization revealed no coronary disease. Nuclear study August 2022 showed no ischemia. Renal Dopplers May 2023 showed 1 to 59% right and no stenosis on the left.  Carotid Dopplers March 2024 near normal on the right and left.  Echo 4/25 showed normal LV function; mild RAE/RVE; moderate MR; s/p AVR with newly elevated mean gradient 26 mmHg; dilated aortic root (44 mm). CTA of thoracic aorta 4/25 with results pending. Since last seen his autonomic dysfunction persist.  He also has dyspnea when his blood pressure drops.  His dyspnea is worse over the past 2 months.  There is no orthopnea, PND, pedal edema, chest pain or syncope.  Current Outpatient Medications  Medication Sig Dispense Refill   amoxicillin  (AMOXIL ) 500 MG capsule TAKE 4 CAPSULES BY MOUTH 1 HOUR PRIOR TO DENTAL APPOINTMENT 4 capsule 0   aspirin  EC 81 MG tablet Take 81 mg by mouth daily.     atorvastatin  (LIPITOR) 20 MG tablet Take 20 mg by mouth daily at 6 PM.      baclofen (LIORESAL) 10 MG tablet Take 10 mg by mouth 3 (three) times daily.     buPROPion  (WELLBUTRIN  SR) 200 MG 12 hr tablet Take 200 mg by mouth 2 (two) times daily.     busPIRone  (BUSPAR ) 10 MG tablet Take 20 mg by mouth 2 (two) times daily.     CALCIUM  CITRATE PO Take 500 mg by mouth 2 (two) times daily.     Cholecalciferol  (VITAMIN D -3 PO) Take 5,000 capsules by mouth daily.     clobetasol (TEMOVATE) 0.05 % external solution Apply 1 Application topically as needed.     clonazePAM  (KLONOPIN ) 0.5 MG tablet Take 0.5 mg by mouth 2 (two) times daily as needed for anxiety.     diclofenac Sodium (VOLTAREN) 1 % GEL Apply 1 application  topically as needed.     dicyclomine (BENTYL) 20  MG tablet Take 20 mg by mouth 4 (four) times daily as needed.     docusate sodium  (COLACE) 100 MG capsule Take 1 capsule (100 mg total) by mouth 2 (two) times daily. 10 capsule 0   DULoxetine  (CYMBALTA ) 60 MG capsule Take 60 mg by mouth 2 (two) times daily.      ferrous sulfate  325 (65 FE) MG EC tablet Take 325 mg by mouth daily.     finasteride  (PROSCAR ) 5 MG tablet Take 5 mg by mouth daily.     fluticasone  (FLONASE ) 50 MCG/ACT nasal spray Place 2 sprays into the nose daily as needed for allergies or rhinitis.     folic acid  (FOLVITE ) 1 MG tablet Take 1 mg by mouth daily.     furosemide  (LASIX ) 20 MG tablet Take 1 tablet (20 mg total) by mouth as needed (for leg swelling). 30 tablet 4   Galcanezumab -gnlm (EMGALITY ) 120 MG/ML SOAJ Inject 120 mg into the skin every 28 (twenty-eight) days. 1 mL 5   hydrALAZINE  (APRESOLINE ) 10 MG tablet Take 1 tablet BY MOUTH three times daily for systolic bp > 140 or diastolic bp > 90 782 tablet 3   insulin  lispro (HUMALOG) 100 UNIT/ML injection as directed Injection as needed     levothyroxine  (SYNTHROID ) 125 MCG tablet Take 125 mcg by mouth daily.  lidocaine  (LIDODERM ) 5 % as needed.     loratadine  (CLARITIN ) 10 MG tablet Take 10 mg by mouth daily as needed for allergies.     Magnesium  250 MG TABS 2 tablets (500mg ) Orally Twice a day     MAGNESIUM  PO Take 500 mg by mouth in the morning and at bedtime.     meclizine (ANTIVERT) 25 MG tablet Take 25 mg by mouth 3 (three) times daily as needed for dizziness.     metFORMIN  (GLUCOPHAGE ) 1000 MG tablet Take 1,000 mg by mouth 2 (two) times daily with a meal.     mirtazapine  (REMERON ) 30 MG tablet 1 tablet at bedtime Orally Once a day     Multiple Vitamins-Minerals (MULTIVITAMIN WITH MINERALS) tablet Take 1 tablet by mouth daily.     Multiple Vitamins-Minerals (PRESERVISION/LUTEIN) CAPS Take 1 capsule by mouth at bedtime.     mupirocin  ointment (BACTROBAN ) 2 % Apply 1 application topically 3 (three) times daily as  needed (infection).     naloxone  (NARCAN ) nasal spray 4 mg/0.1 mL Instill or spray into the nose if you are experiencing shortness of breath or signs of opioid overdose. 1 each 0   omeprazole (PRILOSEC) 20 MG capsule Take 20 mg by mouth 2 (two) times daily before a meal.      oxyCODONE -acetaminophen  (PERCOCET) 10-325 MG tablet Take 0.5 tablets by mouth See admin instructions. 1/2 tablet every 3 hours.     polyethylene glycol (MIRALAX  / GLYCOLAX ) 17 g packet Take 17 g by mouth daily as needed.     potassium chloride  (KLOR-CON ) 10 MEQ tablet Take 10 mEq by mouth as needed (with lasix  dose).     potassium chloride  SA (KLOR-CON  M) 20 MEQ tablet Take 20 mEq by mouth as needed.     pregabalin  (LYRICA ) 100 MG capsule Take 100 mg by mouth 3 (three) times daily.     pseudoephedrine (SUDAFED) 30 MG tablet Take 30 mg by mouth every 6 (six) hours as needed.     Semaglutide ,0.25 or 0.5MG /DOS, (OZEMPIC , 0.25 OR 0.5 MG/DOSE,) 2 MG/3ML SOPN Inject 0.5 mg into the skin once a week. 3 mL 3   silodosin (RAPAFLO) 8 MG CAPS capsule Take 8 mg by mouth daily.     solifenacin (VESICARE) 10 MG tablet Take 10 mg by mouth at bedtime.     tiZANidine  (ZANAFLEX ) 4 MG tablet Take 4 mg by mouth 3 (three) times daily.     traMADol  (ULTRAM ) 50 MG tablet Take 50 mg by mouth 3 (three) times daily.     Ubrogepant  (UBRELVY ) 100 MG TABS Take 1 tablet (100 mg total) by mouth as needed (May repeat in 2 hours.  Maximum 2 tablets in 24 hours.). 48 tablet 3   No current facility-administered medications for this visit.     Past Medical History:  Diagnosis Date   Anxiety and depression    Ascending aorta dilation (HCC) 03/06/2019   4.3 cm 2022   Benign prostatic hyperplasia without lower urinary tract symptoms 11/17/2016   Last Assessment & Plan:  Stable PSA today.  Formatting of this note might be different from the original. Last Assessment & Plan:  Stable PSA today. Last Assessment & Plan:  Formatting of this note might be  different from the original. Stable PSA today.   BPH (benign prostatic hyperplasia)    Bradycardia 02/01/2019   Cervical myelopathy (HCC) 11/14/2018   Charcot's joint of foot 03/05/2019   Chronic back pain 04/03/2013   Chronic inflammatory arthritis 11/17/2016  History of positive rheumatoid factor in the past. Denies placement on immunosuppressive therapy   Chronic pain syndrome    Failed back surgical syndrome.  Chronic neck pain.  Chronic low back pain with sciatica bilateral   Colon cancer screening    2023 cologuard neg   Complete heart block (HCC)    Following AVR s/p St Jude PPM in 03/2017   DM2 (diabetes mellitus, type 2) (HCC) 04/03/2013   Last Assessment & Plan:  Formatting of this note might be different from the original. Diabetes is unchanged.  Continue current treatment regimen. Regular aerobic exercise. Diabetes will be reassessed in 3 months. Will check A1c today. Denies any problems with feet or sensation.   Elevated PSA 11/2021   Enthesopathy of ankle and tarsus 04/04/2013   Last Assessment & Plan:  Continues with swelling after prolonged standing.  Still  Wearing support hose, which helps.   Essential hypertension 10/30/2018   FHx: migraine headaches 04/04/2013   Fracture of capitate bone of wrist 02/26/2019   Fracture of tibia 03/05/2019   GERD (gastroesophageal reflux disease) 10/30/2018   Glaucoma 04/03/2013   Hemangioma of skin and subcutaneous tissue 07/07/2017   History of aortic valve replacement 2018   bicuspid AV   History of kidney stones 11/17/2016   History of sleep apnea    wt loss->gone   Hypothyroidism 10/30/2018   IDA (iron deficiency anemia) 11/2021   malabs d/t gastric bipass   Lesion of plantar nerve 04/04/2013   Lumbar stenosis with neurogenic claudication 01/08/2021   Major depressive disorder, recurrent (HCC)    ECT in the remote past.  Patient disabled due to recurrent depression and chronic pain.   Migraine syndrome    Mixed  dyslipidemia 10/30/2018   Morbid obesity (HCC) 10/27/2011   Nondisplaced fracture of medial cuneiform of left foot, initial encounter for closed fracture 11/16/2018   Osteoarthritis 10/30/2018   Personal history of tobacco use, presenting hazards to health 04/04/2013   Presence of permanent cardiac pacemaker    Severe single current episode of major depressive disorder, without psychotic features (HCC) 11/17/2016   Status post bariatric surgery 04/05/2011   bipass surg --presurg wt 310 lbs   Syncope 02/14/2019   Vitamin D  deficiency 10/30/2018    Past Surgical History:  Procedure Laterality Date   ANTERIOR CERVICAL DECOMP/DISCECTOMY FUSION N/A 11/17/2018   Procedure: CERVICAL THREE-CERVICAL FOUR, CERVICAL FOUR-CERVICAL FIVE, CERVICAL FIVE-CERVICAL SIX ANTERIOR CERVICAL DECOMPRESSION/DISCECTOMY FUSION;  Surgeon: Agustina Aldrich, MD;  Location: [facility] OR;  Service: Neurosurgery;  Laterality: N/A;   AORTIC VALVE REPLACEMENT  03/2017   APPLICATION OF WOUND VAC Left 04/05/2019   Procedure: Application Of Wound Vac;  Surgeon: Donnamarie Gables, MD;  Location: [facility] OR;  Service: Orthopedics;  Laterality: Left;   BACK SURGERY     x4   CARDIOVASCULAR STRESS TEST  05/05/2021   lexiscan  neg   FASCIOTOMY Left 04/05/2019   Left leg 2 compartment fasciotomy   FASCIOTOMY Left 04/05/2019   Procedure: FASCIOTOMY LEFT LOWER LEG;  Surgeon: Donnamarie Gables, MD;  Location: [facility] OR;  Service: Orthopedics;  Laterality: Left;   FOOT NEUROMA SURGERY     GASTRIC BYPASS  2012   HEMATOMA EVACUATION Left 04/05/2019   Procedure: Evacuation Hematoma Left Lower Leg;  Surgeon: Donnamarie Gables, MD;  Location: [facility] OR;  Service: Orthopedics;  Laterality: Left;   HEMORRHOID SURGERY     over 30 years ago   HERNIA REPAIR     LIH umb   I &  D EXTREMITY Left 04/13/2022   Procedure: IRRIGATION AND DEBRIDEMENT OF ELBOW AND BURSECTOMY;  Surgeon: Arvil Birks, MD;  Location: [facility] OR;  Service: Orthopedics;  Laterality: Left;    LUMBAR LAMINECTOMY/DECOMPRESSION MICRODISCECTOMY N/A 01/08/2021   Procedure: CENTRAL LUMBAR LAMINECTOMY LUMBAR TWO-THREE, LUMBAR THREE-FOUR;  Surgeon: Alphonso Jean, MD;  Location: [facility] OR;  Service: Orthopedics;  Laterality: N/A;   NASAL SINUS SURGERY     x2   NECK SURGERY     OLECRANON BURSECTOMY  2023   d/t septic bursitis   TRANSTHORACIC ECHOCARDIOGRAM  12/2020   EF normal, mod MR, ascending AA (4.4 cm)    Social History   Socioeconomic History   Marital status: Married    Spouse name: susan    Number of children: 1   Years of education: Not on file   Highest education level: Bachelor's degree (e.g., BA, AB, BS)  Occupational History   Not on file  Tobacco Use   Smoking status: Never   Smokeless tobacco: Never  Vaping Use   Vaping status: Never Used  Substance and Sexual Activity   Alcohol use: Not Currently    Alcohol/week: 1.0 standard drink of alcohol    Types: 1 Glasses of wine per week    Comment: occ.   Drug use: No   Sexual activity: Not on file  Other Topics Concern   Not on file  Social History Narrative   Patient lives at home with spouse Amalia Badder   Educ: BS   Occup: retired Banker.  Disabled due to depression and pain.   Tobacco: None   Alc: none   Right handed   Social Drivers of Corporate investment banker Strain: Low Risk  (05/30/2019)   Received from [facility], [facility]   Overall Financial Resource Strain (CARDIA)    Difficulty of Paying Living Expenses: Not hard at all  Food Insecurity: No Food Insecurity (05/30/2019)   Received from [facility], [facility]   Hunger Vital Sign    Worried About Running Out of Food in the Last Year: Never true    Ran Out of Food in the Last Year: Never true  Transportation Needs: Unmet Transportation Needs (05/30/2019)   Received from [facility], [facility]   PRAPARE - Transportation    Lack of Transportation (Medical): Yes    Lack of Transportation (Non-Medical): Yes   Physical Activity: Not on file  Stress: Not on file  Social Connections: Unknown (01/06/2023)   Received from [facility], [facility]   Social Network    Social Network: Not on file  Intimate Partner Violence: Unknown (01/06/2023)   Received from [employer], [facility]   HITS    Physically Hurt: Not on file    Insult or Talk Down To: Not on file    Threaten Physical Harm: Not on file    Scream or Curse: Not on file    Family History  Problem Relation Age of Onset   Cancer Mother        breast   Rheum arthritis Mother    Diabetes Mother    Heart disease Father    Kidney disease Father    Diabetes Father    Hypertension Brother    Diabetes Brother    Osteoarthritis Brother    Heart disease Brother    Hypertension Brother    Diabetes Brother    Rheum arthritis Brother    Depression Daughter    Anxiety disorder Daughter  ROS: no fevers or chills, productive cough, hemoptysis, dysphasia, odynophagia, melena, hematochezia, dysuria, hematuria, rash, seizure activity, orthopnea, PND, pedal edema, claudication. Remaining systems are negative.  Physical Exam: Well-developed well-nourished in no acute distress.  Skin is warm and dry.  HEENT is normal.  Neck is supple.  Chest is clear to auscultation with normal expansion.  Cardiovascular exam is regular rate and rhythm.  Abdominal exam nontender or distended. No masses palpated. Extremities show no edema. neuro with history of autonomic dysfunction  A/P  1 status post aortic valve replacement-continue SBE prophylaxis. FU echo shows newly increased gradient across prosthetic aortic valve.  I will arrange a transesophageal echocardiogram to further assess.  Note he has had some increased dyspnea when his blood pressure drops secondary to autonomic dysfunction.  Not clear to me that this would be valve related but we will investigate further as outlined.  2 history of thoracic aortic aneurysm-await follow-up CTA  April 2025.  3 hypertension-patient's blood pressure continues to run low.  He is being evaluated by neurology.  4 history of mitral regurgitation-pt will need follow-up echocardiograms in the future.   5 hyperlipidemia-continue statin.  6 pacemaker-per electrophysiology.  7 history of palpitations-beta-blocker was discontinued previously secondary to hypotension.  8 weakness/dysautonomia-being evaluated at [facility].  Alexandria Angel, MD

## 2023-12-26 ENCOUNTER — Other Ambulatory Visit: Payer: Self-pay | Admitting: Neurology

## 2023-12-26 ENCOUNTER — Other Ambulatory Visit (HOSPITAL_COMMUNITY): Payer: Self-pay

## 2023-12-26 MED ORDER — EMGALITY 120 MG/ML ~~LOC~~ SOAJ
120.0000 mg | SUBCUTANEOUS | 5 refills | Status: DC
  Filled 2023-12-26: qty 1, 28d supply, fill #0
  Filled 2024-01-22: qty 1, 28d supply, fill #1
  Filled 2024-02-19: qty 1, 28d supply, fill #2
  Filled 2024-03-12: qty 1, 28d supply, fill #3

## 2023-12-26 MED ORDER — EMGALITY 120 MG/ML ~~LOC~~ SOAJ
240.0000 mg | Freq: Once | SUBCUTANEOUS | 0 refills | Status: DC
  Filled 2023-12-26: qty 1, 15d supply, fill #0

## 2023-12-26 MED ORDER — OZEMPIC (0.25 OR 0.5 MG/DOSE) 2 MG/3ML ~~LOC~~ SOPN
0.5000 mg | PEN_INJECTOR | SUBCUTANEOUS | 3 refills | Status: DC
  Filled 2023-12-26 – 2024-01-22 (×4): qty 3, 28d supply, fill #0
  Filled 2024-03-12: qty 3, 28d supply, fill #1
  Filled 2024-04-04: qty 3, 28d supply, fill #2
  Filled 2024-05-03: qty 3, 28d supply, fill #3

## 2023-12-27 ENCOUNTER — Ambulatory Visit (HOSPITAL_BASED_OUTPATIENT_CLINIC_OR_DEPARTMENT_OTHER)
Admission: RE | Admit: 2023-12-27 | Discharge: 2023-12-27 | Disposition: A | Discharge status: Home / Self Care | Source: Ambulatory Visit | Attending: Family | Admitting: Family

## 2023-12-27 ENCOUNTER — Ambulatory Visit: Admitting: Cardiology

## 2023-12-27 ENCOUNTER — Encounter: Payer: Self-pay | Admitting: Cardiology

## 2023-12-27 VITALS — BP 131/78 | HR 70 | Ht 75.0 in | Wt 193.0 lb

## 2023-12-27 DIAGNOSIS — Z952 Presence of prosthetic heart valve: Secondary | ICD-10-CM

## 2023-12-27 DIAGNOSIS — I7121 Aneurysm of the ascending aorta, without rupture: Secondary | ICD-10-CM | POA: Diagnosis not present

## 2023-12-27 DIAGNOSIS — E785 Hyperlipidemia, unspecified: Secondary | ICD-10-CM

## 2023-12-27 DIAGNOSIS — I10 Essential (primary) hypertension: Secondary | ICD-10-CM | POA: Diagnosis not present

## 2023-12-27 DIAGNOSIS — I340 Nonrheumatic mitral (valve) insufficiency: Secondary | ICD-10-CM | POA: Insufficient documentation

## 2023-12-27 LAB — POCT I-STAT CREATININE: Creatinine, Ser: 1 mg/dL (ref 0.61–1.24)

## 2023-12-27 MED ORDER — IOHEXOL 350 MG/ML SOLN
100.0000 mL | Freq: Once | INTRAVENOUS | Status: AC | PRN
  Administered 2023-12-27: 75 mL via INTRAVENOUS

## 2023-12-27 NOTE — Patient Instructions (Signed)
 Testing/Procedures:      Dear Antonio Oliver  You are scheduled for a TEE (Transesophageal Echocardiogram) on Monday, April 28 with Dr. Alvis Ba.  Please arrive at the [facility] (Main Entrance A) at [facility]: [address] [place], [place] [address] at 10:00 AM (This time is 1 hour(s) before your procedure to ensure your preparation).   Free valet parking service is available. You will check in at ADMITTING.   *Please Note: You will receive a call the day before your procedure to confirm the appointment time. That time may have changed from the original time based on the schedule for that day.*    DIET:  Nothing to eat or drink after midnight except a sip of water  with medications (see medication instructions below)  MEDICATION INSTRUCTIONS: !!IF ANY NEW MEDICATIONS ARE STARTED AFTER TODAY, PLEASE NOTIFY YOUR PROVIDER AS SOON AS POSSIBLE!!  FYI: Medications such as Semaglutide  (Ozempic , Wegovy ), Tirzepatide (Mounjaro, Zepbound), Dulaglutide (Trulicity), etc ("GLP1 agonists") AND Canagliflozin (Invokana), Dapagliflozin (Farxiga), Empagliflozin (Jardiance), Ertugliflozin (Steglatro), Bexagliflozin [employer]) or any combination with one of these drugs such as Invokamet (Canagliflozin/Metformin ), Synjardy (Empagliflozin/Metformin ), etc ("SGLT2 inhibitors") must be held around the time of a procedure. This is not a comprehensive list of all of these drugs. Please review all of your medications and talk to your provider if you take any one of these. If you are not sure, ask your provider.  HOLD: Semaglutide  (Ozempic , Rybelsus , Wegovy ) for 1 day prior to the procedure. Last dose on Monday, April 21ST      LABS: TODAY  FYI:  For your safety, and to allow us  to monitor your vital signs accurately during the surgery/procedure we request: If you have artificial nails, gel coating, SNS etc, please have those removed prior to your surgery/procedure. Not having the nail coverings  /polish removed may result in cancellation or delay of your surgery/procedure.  Your support person will be asked to wait in the waiting room during your procedure.  It is OK to have someone drop you off and come back when you are ready to be discharged.  You cannot drive after the procedure and will need someone to drive you home.  Bring your insurance cards.  *Special Note: Every effort is made to have your procedure done on time. Occasionally there are emergencies that occur at the hospital that may cause delays. Please be patient if a delay does occur.      Follow-Up: At [facility], you and your health needs are our priority.  As part of our continuing mission to provide you with exceptional heart care, our providers are all part of one team.  This team includes your primary Cardiologist (physician) and Advanced Practice Providers or APPs (Physician Assistants and Nurse Practitioners) who all work together to provide you with the care you need, when you need it.  Your next appointment:   3 month(s)  Provider:   Alexandria Angel, MD           1st Floor: - Lobby - Registration  - Pharmacy  - Lab - Cafe  2nd Floor: - PV Lab - Diagnostic Testing (echo, CT, nuclear med)  3rd Floor: - Vacant  4th Floor: - TCTS (cardiothoracic surgery) - AFib Clinic - Structural Heart Clinic - Vascular Surgery  - Vascular Ultrasound  5th Floor: - HeartCare Cardiology (general and EP) - Clinical Pharmacy for coumadin, hypertension, lipid, weight-loss medications, and med management appointments    Valet parking services will be available  as well.

## 2023-12-28 ENCOUNTER — Encounter: Payer: Self-pay | Admitting: *Deleted

## 2023-12-28 LAB — BASIC METABOLIC PANEL WITH GFR
BUN/Creatinine Ratio: 17 (ref 10–24)
BUN: 15 mg/dL (ref 8–27)
CO2: 21 mmol/L (ref 20–29)
Calcium: 9.2 mg/dL (ref 8.6–10.2)
Chloride: 108 mmol/L — ABNORMAL HIGH (ref 96–106)
Creatinine, Ser: 0.9 mg/dL (ref 0.76–1.27)
Glucose: 118 mg/dL — ABNORMAL HIGH (ref 70–99)
Potassium: 4.9 mmol/L (ref 3.5–5.2)
Sodium: 139 mmol/L (ref 134–144)
eGFR: 94 mL/min/{1.73_m2} (ref >59)

## 2023-12-28 LAB — CBC
Hematocrit: 32.6 % — ABNORMAL LOW (ref 37.5–51.0)
Hemoglobin: 9.9 g/dL — ABNORMAL LOW (ref 13.0–17.7)
MCH: 24.3 pg — ABNORMAL LOW (ref 26.6–33.0)
MCHC: 30.4 g/dL — ABNORMAL LOW (ref 31.5–35.7)
MCV: 80 fL (ref 79–97)
Platelets: 242 10*3/uL (ref 150–450)
RBC: 4.07 x10E6/uL — ABNORMAL LOW (ref 4.14–5.80)
RDW: 15.4 % (ref 11.6–15.4)
WBC: 6.1 10*3/uL (ref 3.4–10.8)

## 2023-12-29 DIAGNOSIS — I493 Ventricular premature depolarization: Secondary | ICD-10-CM | POA: Diagnosis not present

## 2023-12-30 ENCOUNTER — Other Ambulatory Visit (HOSPITAL_COMMUNITY): Payer: Self-pay

## 2023-12-30 NOTE — Progress Notes (Signed)
 Spoke to patient's wife and instructed them to come at 0900  and to be NPO after 0000.  Medications reviewed.    Confirmed that patient will have a ride home and someone to stay with them for 24 hours after the procedure.

## 2024-01-02 ENCOUNTER — Ambulatory Visit (HOSPITAL_COMMUNITY): Admitting: Anesthesiology

## 2024-01-02 ENCOUNTER — Ambulatory Visit (HOSPITAL_COMMUNITY)
Admission: RE | Admit: 2024-01-02 | Discharge: 2024-01-02 | Disposition: A | Discharge status: Home / Self Care | Attending: Cardiovascular Disease | Admitting: Cardiovascular Disease

## 2024-01-02 ENCOUNTER — Encounter (HOSPITAL_COMMUNITY)
Admission: RE | Disposition: A | Discharge status: Home / Self Care | Payer: Self-pay | Source: Home / Self Care | Attending: Cardiovascular Disease

## 2024-01-02 ENCOUNTER — Ambulatory Visit (HOSPITAL_COMMUNITY)
Admission: RE | Admit: 2024-01-02 | Discharge: 2024-01-02 | Disposition: A | Discharge status: Home / Self Care | Source: Ambulatory Visit | Attending: Cardiovascular Disease | Admitting: Cardiovascular Disease

## 2024-01-02 ENCOUNTER — Other Ambulatory Visit (HOSPITAL_COMMUNITY): Payer: Self-pay

## 2024-01-02 ENCOUNTER — Other Ambulatory Visit: Payer: Self-pay

## 2024-01-02 ENCOUNTER — Encounter (HOSPITAL_COMMUNITY): Payer: Self-pay | Admitting: Cardiovascular Disease

## 2024-01-02 DIAGNOSIS — I359 Nonrheumatic aortic valve disorder, unspecified: Secondary | ICD-10-CM

## 2024-01-02 DIAGNOSIS — I10 Essential (primary) hypertension: Secondary | ICD-10-CM | POA: Insufficient documentation

## 2024-01-02 DIAGNOSIS — Z794 Long term (current) use of insulin: Secondary | ICD-10-CM | POA: Diagnosis not present

## 2024-01-02 DIAGNOSIS — E039 Hypothyroidism, unspecified: Secondary | ICD-10-CM | POA: Diagnosis not present

## 2024-01-02 DIAGNOSIS — Z950 Presence of cardiac pacemaker: Secondary | ICD-10-CM | POA: Insufficient documentation

## 2024-01-02 DIAGNOSIS — I080 Rheumatic disorders of both mitral and aortic valves: Secondary | ICD-10-CM | POA: Diagnosis not present

## 2024-01-02 DIAGNOSIS — I442 Atrioventricular block, complete: Secondary | ICD-10-CM | POA: Diagnosis not present

## 2024-01-02 DIAGNOSIS — E119 Type 2 diabetes mellitus without complications: Secondary | ICD-10-CM

## 2024-01-02 DIAGNOSIS — I340 Nonrheumatic mitral (valve) insufficiency: Secondary | ICD-10-CM | POA: Diagnosis not present

## 2024-01-02 DIAGNOSIS — Z7985 Long-term (current) use of injectable non-insulin antidiabetic drugs: Secondary | ICD-10-CM | POA: Insufficient documentation

## 2024-01-02 DIAGNOSIS — R531 Weakness: Secondary | ICD-10-CM | POA: Diagnosis not present

## 2024-01-02 DIAGNOSIS — G901 Familial dysautonomia [Riley-Day]: Secondary | ICD-10-CM | POA: Diagnosis not present

## 2024-01-02 DIAGNOSIS — I350 Nonrheumatic aortic (valve) stenosis: Secondary | ICD-10-CM

## 2024-01-02 DIAGNOSIS — Z952 Presence of prosthetic heart valve: Secondary | ICD-10-CM | POA: Diagnosis not present

## 2024-01-02 DIAGNOSIS — E785 Hyperlipidemia, unspecified: Secondary | ICD-10-CM | POA: Insufficient documentation

## 2024-01-02 DIAGNOSIS — Z7984 Long term (current) use of oral hypoglycemic drugs: Secondary | ICD-10-CM | POA: Diagnosis not present

## 2024-01-02 DIAGNOSIS — Z79899 Other long term (current) drug therapy: Secondary | ICD-10-CM | POA: Diagnosis not present

## 2024-01-02 DIAGNOSIS — E1143 Type 2 diabetes mellitus with diabetic autonomic (poly)neuropathy: Secondary | ICD-10-CM | POA: Insufficient documentation

## 2024-01-02 DIAGNOSIS — K219 Gastro-esophageal reflux disease without esophagitis: Secondary | ICD-10-CM | POA: Insufficient documentation

## 2024-01-02 DIAGNOSIS — Z79890 Hormone replacement therapy (postmenopausal): Secondary | ICD-10-CM | POA: Diagnosis not present

## 2024-01-02 HISTORY — PX: TRANSESOPHAGEAL ECHOCARDIOGRAM (CATH LAB): EP1270

## 2024-01-02 LAB — ECHO TEE
AR max vel: 0.99 cm2
AV Area VTI: 1.04 cm2
AV Area mean vel: 0.89 cm2
AV Mean grad: 22.6 mmHg
AV Peak grad: 37.8 mmHg
Ao pk vel: 3.07 m/s
MV M vel: 6.1 m/s
MV Peak grad: 148.8 mmHg

## 2024-01-02 SURGERY — TRANSESOPHAGEAL ECHOCARDIOGRAM (TEE) (CATHLAB)
Anesthesia: Monitor Anesthesia Care

## 2024-01-02 MED ORDER — PROPOFOL 10 MG/ML IV BOLUS
INTRAVENOUS | Status: DC | PRN
  Administered 2024-01-02 (×4): 10 mg via INTRAVENOUS
  Administered 2024-01-02: 60 mg via INTRAVENOUS
  Administered 2024-01-02: 20 mg via INTRAVENOUS

## 2024-01-02 MED ORDER — SODIUM CHLORIDE 0.9 % IV SOLN: INTRAVENOUS | Status: DC | PRN

## 2024-01-02 MED ORDER — SODIUM CHLORIDE 0.9% FLUSH: 3.0000 mL | INTRAVENOUS | Status: DC | PRN

## 2024-01-02 MED ORDER — LIDOCAINE 2% (20 MG/ML) 5 ML SYRINGE
INTRAMUSCULAR | Status: DC | PRN
Start: 2024-01-02 — End: 2024-01-02
  Administered 2024-01-02: 50 mg via INTRAVENOUS

## 2024-01-02 MED ORDER — SODIUM CHLORIDE 0.9% FLUSH: 3.0000 mL | Freq: Two times a day (BID) | INTRAVENOUS | Status: DC

## 2024-01-02 NOTE — Transfer of Care (Signed)
 Immediate Anesthesia Transfer of Care Note  Patient: Antonio Oliver  Procedure(s) Performed: TRANSESOPHAGEAL ECHOCARDIOGRAM  Patient Location: PACU  Anesthesia Type:MAC  Level of Consciousness: drowsy  Airway & Oxygen Therapy: Patient Spontanous Breathing and Patient connected to nasal cannula oxygen  Post-op Assessment: Report given to RN and Post -op Vital signs reviewed and stable  Post vital signs: Reviewed and stable  Last Vitals:  Vitals Value Taken Time  BP 104/78   Temp    Pulse 71   Resp 18   SpO2 98     Last Pain:  Vitals:   01/02/24 0916  TempSrc: Temporal         Complications: No notable events documented.

## 2024-01-02 NOTE — Discharge Instructions (Signed)

## 2024-01-02 NOTE — Interval H&P Note (Signed)
 History and Physical Interval Note:  01/02/2024 9:11 AM  Antonio Oliver  has presented today for surgery, with the diagnosis of aortic valve disorder.  The various methods of treatment have been discussed with the patient and family. After consideration of risks, benefits and other options for treatment, the patient has consented to  Procedure(s): TRANSESOPHAGEAL ECHOCARDIOGRAM (N/A) as a surgical intervention.  The patient's history has been reviewed, patient examined, no change in status, stable for surgery.  I have reviewed the patient's chart and labs.  Questions were answered to the patient's satisfaction.     Samanthan Dugo

## 2024-01-02 NOTE — Anesthesia Preprocedure Evaluation (Addendum)
 Anesthesia Evaluation  Patient identified by MRN, date of birth, ID band Patient awake    Reviewed: Allergy & Precautions, NPO status , Patient's Chart, lab work & pertinent test results, reviewed documented beta blocker date and time   Airway Mallampati: II  TM Distance: >3 FB     Dental  (+) Teeth Intact, Dental Advisory Given   Pulmonary neg pulmonary ROS   Pulmonary exam normal breath sounds clear to auscultation       Cardiovascular hypertension, Pt. on medications and Pt. on home beta blockers Normal cardiovascular exam+ dysrhythmias + pacemaker + Valvular Problems/Murmurs AS  Rhythm:Regular Rate:Normal  AVR 04/2022 for bicuspid AV  Echo 12/12/23 1. Left ventricular ejection fraction, by estimation, is 65 to 70%. Left  ventricular ejection fraction by 3D volume is 67 %. The left ventricle has  normal function. The left ventricle has no regional wall motion  abnormalities. Left ventricular diastolic   parameters are indeterminate.   2. Right ventricular systolic function is normal. The right ventricular  size is mildly enlarged. Tricuspid regurgitation signal is inadequate for  assessing PA pressure.   3. Right atrial size was mildly dilated.   4. The mitral valve is abnormal. Thickened anterior mitral valve leaflet.  Moderate mitral valve regurgitation. No evidence of mitral stenosis.  Eccentric posterior directed MR jet, may be underestimating given  eccentric jet   5. Aortic dilatation noted. There is dilatation of the ascending aorta,  measuring 44 mm.   6. There is a bioprosthetic valve present in the aortic position.      Aortic valve regurgitation is not visualized. Echo findings are  consistent with stenosis of the aortic prosthesis. Vmax 3.5 m/s, MG  , EOA 1.0 cm^2, DI 0.30. Compared to prior echo 11/2022, there is  significant increase in mean gradient across  prosthetic aortic valve (from 9 to 26 mmHg)      Neuro/Psych  Headaches PSYCHIATRIC DISORDERS Anxiety Depression     Neuromuscular disease    GI/Hepatic Neg liver ROS,GERD  Medicated,,  Endo/Other  diabetes, Type 2Hypothyroidism  GLP-1 RA therapy  Renal/GU negative Renal ROS     Musculoskeletal  (+) Arthritis , Osteoarthritis,  Post laminectomy syndrome   Abdominal   Peds  Hematology  (+) Blood dyscrasia, anemia   Anesthesia Other Findings   Reproductive/Obstetrics                              Anesthesia Physical Anesthesia Plan  ASA: 3  Anesthesia Plan: MAC   Post-op Pain Management: Minimal or no pain anticipated   Induction: Intravenous  PONV Risk Score and Plan: 1 and Treatment may vary due to age or medical condition and Propofol  infusion  Airway Management Planned: Natural Airway and Nasal Cannula  Additional Equipment: None  Intra-op Plan:   Post-operative Plan:   Informed Consent: I have reviewed the patients History and Physical, chart, labs and discussed the procedure including the risks, benefits and alternatives for the proposed anesthesia with the patient or authorized representative who has indicated his/her understanding and acceptance.     Dental advisory given  Plan Discussed with: CRNA and Anesthesiologist  Anesthesia Plan Comments:          Anesthesia Quick Evaluation

## 2024-01-02 NOTE — Anesthesia Postprocedure Evaluation (Signed)
 Anesthesia Post Note  Patient: Antonio Oliver  Procedure(s) Performed: TRANSESOPHAGEAL ECHOCARDIOGRAM     Patient location during evaluation: PACU Anesthesia Type: MAC Level of consciousness: awake and alert and oriented Pain management: pain level controlled Vital Signs Assessment: post-procedure vital signs reviewed and stable Respiratory status: spontaneous breathing, nonlabored ventilation and respiratory function stable Cardiovascular status: stable and blood pressure returned to baseline Postop Assessment: no apparent nausea or vomiting Anesthetic complications: no   No notable events documented.  Last Vitals:  Vitals:   01/02/24 1030 01/02/24 1040  BP: 136/80 (!) 146/101  Pulse: 75 70  Resp: 14 14  Temp:    SpO2: 95% 99%    Last Pain:  Vitals:   01/02/24 1020  TempSrc: Temporal  PainSc: 0-No pain                 Lark Runk A.

## 2024-01-02 NOTE — Op Note (Signed)
 INDICATIONS: Aortic valve prosthesis stenosis  PROCEDURE:   Informed consent was obtained prior to the procedure. The risks, benefits and alternatives for the procedure were discussed and the patient comprehended these risks.  Risks include, but are not limited to, cough, sore throat, vomiting, nausea, somnolence, esophageal and stomach trauma or perforation, bleeding, low blood pressure, aspiration, pneumonia, infection, trauma to the teeth and death.    After a procedural time-out, the oropharynx was anesthetized with 20% benzocaine spray.   During this procedure the patient was administered IV propofol  by Anesthesiology, Dr. Yvonnie Heritage.  The transesophageal probe was inserted in the esophagus and stomach without difficulty and multiple views were obtained.  The patient was kept under observation until the patient left the procedure room.  The patient left the procedure room in stable condition.   Agitated microbubble saline contrast was not administered.  COMPLICATIONS:    There were no immediate complications.  FINDINGS:  Aortic valve prosthesis with markedly limited motion of the "left" cusp and moderately reduced motion of the "right" cusp, but with normal excursion of the "noncoronary" leaflet. There is no evidence of thrombus or vegetation. There is no aortic insufficiency. Moderate prosthetic valve stenosis.  There is also moderate eccentric posteriorly directed mitral insufficiency.  Normal LV function.  RECOMMENDATIONS:     Continue monitoring with Transthoracic echo.  Time Spent Directly with the Patient:  45 minutes   Antonio Oliver 01/02/2024, 10:14 AM

## 2024-01-02 NOTE — Progress Notes (Signed)
  Echocardiogram Echocardiogram Transesophageal has been performed.  Antonio Oliver 01/02/2024, 10:21 AM

## 2024-01-03 ENCOUNTER — Encounter (HOSPITAL_BASED_OUTPATIENT_CLINIC_OR_DEPARTMENT_OTHER): Payer: Self-pay

## 2024-01-03 ENCOUNTER — Telehealth: Payer: Self-pay | Admitting: Cardiovascular Disease

## 2024-01-03 ENCOUNTER — Telehealth: Payer: Self-pay | Admitting: Neurology

## 2024-01-03 DIAGNOSIS — S24112A Complete lesion at T2-T6 level of thoracic spinal cord, initial encounter: Secondary | ICD-10-CM

## 2024-01-03 NOTE — Telephone Encounter (Signed)
 Results called to patient who verbalizes understanding! Labs ordered, they will get them done! Pts wife states they have appointment next week with neuro and will also discuss findings with him.   Wife does have questions regarding TEE done yesterday and what kind of follow up pt may need. Advised will send to care team for review.

## 2024-01-03 NOTE — Telephone Encounter (Signed)
 Antonio Oliver calling from [facility] radiology calling with CT results  1. Thoracic aortic aneurysm measures 4.4 x 4.0 cm. 2. Recommend annual imaging followup by CTA or MRA

## 2024-01-03 NOTE — Telephone Encounter (Signed)
 Called Antonio Oliver and she is okay to wait and let Dr Festus Hubert look at the CT that was ordered from Dr. Neomi Banks , NP and also need to see when Dr. Festus Hubert ordered the MRI.

## 2024-01-03 NOTE — Telephone Encounter (Signed)
 Caller (Diane) reporting results.

## 2024-01-03 NOTE — Telephone Encounter (Signed)
 Thanks. Known aneurysm, minimally changed.

## 2024-01-03 NOTE — Telephone Encounter (Signed)
 Pt wife called and wants Dr Festus Hubert to look at the CT scan that was done recently. Some new lesions showed up on the scan. She is aware that Dr Festus Hubert is out of the office this week. I did get him on the wait list as she wanted to move the appt up from July  to talk about the new lesions

## 2024-01-03 NOTE — Telephone Encounter (Signed)
 CT reviewed and resulted.   "CT shows aortic aneurysm 4.4 x 4.0 cm. Previously 2023 measurement 4.3 cm and 2024 measurement 4.1 cm. This is overall stable. Repeat CT aorta in 1 year for monitoring. Small pulmonary nodules similar to previous, no specific follow up required. Sclerotic bony lesion largest in T3 vertebral body 1.7 cm not noted on prior CT. Concerning for possible metastatic disease. Recommend plasma PSA and prompt follow up with PCP to determine if further imaging (MRI vs bone scan) is indicated."  Patient made aware via MyChart and our RN team will call as well.   Lamica Mccart S Satish Hammers, NP

## 2024-01-09 ENCOUNTER — Telehealth: Payer: Self-pay | Admitting: Cardiology

## 2024-01-09 NOTE — Telephone Encounter (Signed)
 Called pt spouse X2 received message call can not be completed at this time.

## 2024-01-09 NOTE — Telephone Encounter (Signed)
 Spouse requesting cb to further discuss testing-TEE and next steps

## 2024-01-10 ENCOUNTER — Telehealth: Payer: Self-pay | Admitting: *Deleted

## 2024-01-10 ENCOUNTER — Other Ambulatory Visit: Payer: Self-pay | Admitting: Gastroenterology

## 2024-01-10 ENCOUNTER — Telehealth: Payer: Self-pay | Admitting: Cardiology

## 2024-01-10 MED ORDER — MEXILETINE HCL 250 MG PO CAPS: 250.0000 mg | ORAL_CAPSULE | Freq: Two times a day (BID) | ORAL | 3 refills | Status: DC

## 2024-01-10 NOTE — Telephone Encounter (Signed)
 Spoke to wife, dpr on file (stated she handles all his medical care).  Made aware of findings and advisement, agreeable to starting Mexiletine 250 mg BID.  Advised to call the office with any issues after starting a new medication. Aware office will call to arrange a 3 month follow up with Dr. Glade Lambert APP after staring Mexiletine.  Wife agreeable to plan.

## 2024-01-10 NOTE — Telephone Encounter (Signed)
   Primary Cardiologist: Alexandria Angel, MD  Chart reviewed as part of pre-operative protocol coverage. Given past medical history and time since last visit, based on ACC/AHA guidelines, Antonio Oliver would be at acceptable risk for the planned procedure without further cardiovascular testing.   Patient was advised that if he develops new symptoms prior to surgery to contact our office to arrange a follow-up appointment.  He verbalized understanding.  I will route this recommendation to the requesting party via Epic fax function and remove from pre-op pool.  Please call with questions.  Gerldine Koch, NP-C  01/10/2024, 2:35 PM [address], [address] [place], [place] [address] Office [phone] Fax [phone]

## 2024-01-10 NOTE — Telephone Encounter (Signed)
   Pre-operative Risk Assessment    Patient Name: Antonio Oliver  DOB: October 12, 1955 MRN: [id number]   Date of last office visit: 12/27/23 DR. CRENSHAW Date of next office visit: 03/27/24 DR. CRENSHAW   Request for Surgical Clearance    Procedure:   COLONOSCOPY  (CHANGE IN BOWEL HABITS: LLQ PAIN)  Date of Surgery:  Clearance 02/14/24                                Surgeon:  DR. Honey Lusty Surgeon's Group or Practice Name:  [facility] Phone number:  [phone] Fax number:  [phone]   Type of Clearance Requested:   - Medical ; NONE INDICATED TO BE HELD   Type of Anesthesia:   PROPOFOL     Additional requests/questions:    [name]   01/10/2024, 1:29 PM

## 2024-01-10 NOTE — Telephone Encounter (Addendum)
 Spoke with Antonio Oliver, Aware of dr Lourdes Roy recommendations.  Patient has a follow up appointment in July.

## 2024-01-10 NOTE — Telephone Encounter (Signed)
 RX sent in on 01/10/24

## 2024-01-10 NOTE — Telephone Encounter (Signed)
 Forwarding to Dr. Audery Blazing per pt request

## 2024-01-10 NOTE — Telephone Encounter (Signed)
-----   Message from Will [facility] sent at 12/29/2023  7:58 AM EDT ----- Elevated PVCs, start mexiletine 250 mg BID

## 2024-01-10 NOTE — Telephone Encounter (Signed)
*  STAT* If patient is at the pharmacy, call can be transferred to refill team.   1. Which medications need to be refilled? (please list name of each medication and dose if known) mexiletine (MEXITIL) 250 MG capsule   2. Which pharmacy/location (including street and city if local pharmacy) is medication to be sent to? [facility] [place], [place] - [address] [place]   3. Do they need a 30 day or 90 day supply? 90

## 2024-01-12 ENCOUNTER — Encounter: Payer: Self-pay | Admitting: Neurology

## 2024-01-16 ENCOUNTER — Ambulatory Visit: Payer: Medicare PPO

## 2024-01-16 DIAGNOSIS — I442 Atrioventricular block, complete: Secondary | ICD-10-CM

## 2024-01-17 LAB — CUP PACEART REMOTE DEVICE CHECK
Battery Remaining Longevity: 48 mo
Battery Remaining Percentage: 42 %
Battery Voltage: 2.98 V
Brady Statistic AP VP Percent: 8.5 %
Brady Statistic AP VS Percent: 1.3 %
Brady Statistic AS VP Percent: 16 %
Brady Statistic AS VS Percent: 69 %
Brady Statistic RA Percent Paced: 2 %
Brady Statistic RV Percent Paced: 24 %
Date Time Interrogation Session: 20250512060226
Implantable Lead Connection Status: 753985
Implantable Lead Connection Status: 753985
Implantable Lead Implant Date: 20180710
Implantable Lead Implant Date: 20180710
Implantable Lead Location: 753859
Implantable Lead Location: 753860
Implantable Pulse Generator Implant Date: 20180710
Lead Channel Impedance Value: 410 Ohm
Lead Channel Impedance Value: 450 Ohm
Lead Channel Pacing Threshold Amplitude: 0.5 V
Lead Channel Pacing Threshold Amplitude: 0.875 V
Lead Channel Pacing Threshold Pulse Width: 0.4 ms
Lead Channel Pacing Threshold Pulse Width: 0.4 ms
Lead Channel Sensing Intrinsic Amplitude: 3 mV
Lead Channel Sensing Intrinsic Amplitude: 7.9 mV
Lead Channel Setting Pacing Amplitude: 1.125
Lead Channel Setting Pacing Amplitude: 2 V
Lead Channel Setting Pacing Pulse Width: 0.4 ms
Lead Channel Setting Sensing Sensitivity: 0.5 mV
Pulse Gen Model: 2272
Pulse Gen Serial Number: 8912213

## 2024-01-18 ENCOUNTER — Encounter: Payer: Self-pay | Admitting: Medical Oncology

## 2024-01-18 ENCOUNTER — Ambulatory Visit: Payer: Self-pay | Admitting: Cardiology

## 2024-01-19 ENCOUNTER — Inpatient Hospital Stay: Attending: Physician Assistant | Admitting: Physician Assistant

## 2024-01-19 ENCOUNTER — Encounter: Payer: Self-pay | Admitting: Medical Oncology

## 2024-01-19 ENCOUNTER — Inpatient Hospital Stay

## 2024-01-19 ENCOUNTER — Encounter: Payer: Self-pay | Admitting: Physician Assistant

## 2024-01-19 VITALS — BP 132/76 | HR 82 | Temp 98.3°F | Resp 16 | Wt 195.6 lb

## 2024-01-19 DIAGNOSIS — Z841 Family history of disorders of kidney and ureter: Secondary | ICD-10-CM | POA: Insufficient documentation

## 2024-01-19 DIAGNOSIS — M546 Pain in thoracic spine: Secondary | ICD-10-CM | POA: Diagnosis not present

## 2024-01-19 DIAGNOSIS — Z885 Allergy status to narcotic agent status: Secondary | ICD-10-CM | POA: Insufficient documentation

## 2024-01-19 DIAGNOSIS — M545 Low back pain, unspecified: Secondary | ICD-10-CM | POA: Insufficient documentation

## 2024-01-19 DIAGNOSIS — Z803 Family history of malignant neoplasm of breast: Secondary | ICD-10-CM | POA: Insufficient documentation

## 2024-01-19 DIAGNOSIS — M899 Disorder of bone, unspecified: Secondary | ICD-10-CM

## 2024-01-19 DIAGNOSIS — N400 Enlarged prostate without lower urinary tract symptoms: Secondary | ICD-10-CM | POA: Insufficient documentation

## 2024-01-19 DIAGNOSIS — R0602 Shortness of breath: Secondary | ICD-10-CM | POA: Diagnosis not present

## 2024-01-19 DIAGNOSIS — R2689 Other abnormalities of gait and mobility: Secondary | ICD-10-CM | POA: Diagnosis not present

## 2024-01-19 DIAGNOSIS — I472 Ventricular tachycardia, unspecified: Secondary | ICD-10-CM | POA: Diagnosis not present

## 2024-01-19 DIAGNOSIS — M542 Cervicalgia: Secondary | ICD-10-CM | POA: Diagnosis not present

## 2024-01-19 DIAGNOSIS — F419 Anxiety disorder, unspecified: Secondary | ICD-10-CM | POA: Diagnosis not present

## 2024-01-19 DIAGNOSIS — I493 Ventricular premature depolarization: Secondary | ICD-10-CM | POA: Insufficient documentation

## 2024-01-19 DIAGNOSIS — R918 Other nonspecific abnormal finding of lung field: Secondary | ICD-10-CM | POA: Insufficient documentation

## 2024-01-19 DIAGNOSIS — Z79899 Other long term (current) drug therapy: Secondary | ICD-10-CM | POA: Insufficient documentation

## 2024-01-19 DIAGNOSIS — M549 Dorsalgia, unspecified: Secondary | ICD-10-CM | POA: Diagnosis not present

## 2024-01-19 DIAGNOSIS — K5909 Other constipation: Secondary | ICD-10-CM | POA: Diagnosis not present

## 2024-01-19 DIAGNOSIS — G8929 Other chronic pain: Secondary | ICD-10-CM | POA: Insufficient documentation

## 2024-01-19 DIAGNOSIS — Z8679 Personal history of other diseases of the circulatory system: Secondary | ICD-10-CM | POA: Diagnosis present

## 2024-01-19 DIAGNOSIS — E119 Type 2 diabetes mellitus without complications: Secondary | ICD-10-CM | POA: Insufficient documentation

## 2024-01-19 DIAGNOSIS — D649 Anemia, unspecified: Secondary | ICD-10-CM

## 2024-01-19 DIAGNOSIS — R194 Change in bowel habit: Secondary | ICD-10-CM | POA: Insufficient documentation

## 2024-01-19 DIAGNOSIS — I083 Combined rheumatic disorders of mitral, aortic and tricuspid valves: Secondary | ICD-10-CM | POA: Insufficient documentation

## 2024-01-19 DIAGNOSIS — Z9884 Bariatric surgery status: Secondary | ICD-10-CM | POA: Insufficient documentation

## 2024-01-19 DIAGNOSIS — Z7982 Long term (current) use of aspirin: Secondary | ICD-10-CM | POA: Diagnosis not present

## 2024-01-19 DIAGNOSIS — Z833 Family history of diabetes mellitus: Secondary | ICD-10-CM | POA: Insufficient documentation

## 2024-01-19 DIAGNOSIS — Z5982 Transportation insecurity: Secondary | ICD-10-CM | POA: Insufficient documentation

## 2024-01-19 DIAGNOSIS — I119 Hypertensive heart disease without heart failure: Secondary | ICD-10-CM | POA: Diagnosis not present

## 2024-01-19 DIAGNOSIS — I700 Atherosclerosis of aorta: Secondary | ICD-10-CM | POA: Insufficient documentation

## 2024-01-19 DIAGNOSIS — Z818 Family history of other mental and behavioral disorders: Secondary | ICD-10-CM

## 2024-01-19 DIAGNOSIS — Z8719 Personal history of other diseases of the digestive system: Secondary | ICD-10-CM | POA: Insufficient documentation

## 2024-01-19 DIAGNOSIS — K5900 Constipation, unspecified: Secondary | ICD-10-CM

## 2024-01-19 DIAGNOSIS — M25519 Pain in unspecified shoulder: Secondary | ICD-10-CM | POA: Diagnosis not present

## 2024-01-19 DIAGNOSIS — Z86018 Personal history of other benign neoplasm: Secondary | ICD-10-CM | POA: Insufficient documentation

## 2024-01-19 DIAGNOSIS — Z8249 Family history of ischemic heart disease and other diseases of the circulatory system: Secondary | ICD-10-CM | POA: Insufficient documentation

## 2024-01-19 DIAGNOSIS — R1032 Left lower quadrant pain: Secondary | ICD-10-CM | POA: Insufficient documentation

## 2024-01-19 DIAGNOSIS — Z8261 Family history of arthritis: Secondary | ICD-10-CM | POA: Insufficient documentation

## 2024-01-19 DIAGNOSIS — F32A Depression, unspecified: Secondary | ICD-10-CM | POA: Insufficient documentation

## 2024-01-19 LAB — CMP (CANCER CENTER ONLY)
ALT: 17 U/L (ref 0–44)
AST: 14 U/L — ABNORMAL LOW (ref 15–41)
Albumin: 4.4 g/dL (ref 3.5–5.0)
Alkaline Phosphatase: 92 U/L (ref 38–126)
Anion gap: 8 (ref 5–15)
BUN: 16 mg/dL (ref 8–23)
CO2: 25 mmol/L (ref 22–32)
Calcium: 9.3 mg/dL (ref 8.9–10.3)
Chloride: 107 mmol/L (ref 98–111)
Creatinine: 1.02 mg/dL (ref 0.61–1.24)
GFR, Estimated: >60 mL/min (ref >60)
Glucose, Bld: 171 mg/dL — ABNORMAL HIGH (ref 70–99)
Potassium: 4.7 mmol/L (ref 3.5–5.1)
Sodium: 140 mmol/L (ref 135–145)
Total Bilirubin: 0.4 mg/dL (ref 0.0–1.2)
Total Protein: 6.9 g/dL (ref 6.5–8.1)

## 2024-01-19 LAB — IRON AND IRON BINDING CAPACITY (CC-WL,HP ONLY)
Iron: 27 ug/dL — ABNORMAL LOW (ref 45–182)
Saturation Ratios: 6 % — ABNORMAL LOW (ref 17.9–39.5)
TIBC: 477 ug/dL — ABNORMAL HIGH (ref 250–450)
UIBC: 450 ug/dL — ABNORMAL HIGH (ref 117–376)

## 2024-01-19 LAB — CBC WITH DIFFERENTIAL (CANCER CENTER ONLY)
Abs Immature Granulocytes: 0.02 10*3/uL (ref 0.00–0.07)
Basophils Absolute: 0.1 10*3/uL (ref 0.0–0.1)
Basophils Relative: 1 %
Eosinophils Absolute: 0.3 10*3/uL (ref 0.0–0.5)
Eosinophils Relative: 4 %
HCT: 33 % — ABNORMAL LOW (ref 39.0–52.0)
Hemoglobin: 10.2 g/dL — ABNORMAL LOW (ref 13.0–17.0)
Immature Granulocytes: 0 %
Lymphocytes Relative: 17 %
Lymphs Abs: 1 10*3/uL (ref 0.7–4.0)
MCH: 23.8 pg — ABNORMAL LOW (ref 26.0–34.0)
MCHC: 30.9 g/dL (ref 30.0–36.0)
MCV: 77.1 fL — ABNORMAL LOW (ref 80.0–100.0)
Monocytes Absolute: 0.3 10*3/uL (ref 0.1–1.0)
Monocytes Relative: 5 %
Neutro Abs: 4.5 10*3/uL (ref 1.7–7.7)
Neutrophils Relative %: 73 %
Platelet Count: 252 10*3/uL (ref 150–400)
RBC: 4.28 MIL/uL (ref 4.22–5.81)
RDW: 16.5 % — ABNORMAL HIGH (ref 11.5–15.5)
WBC Count: 6.2 10*3/uL (ref 4.0–10.5)
nRBC: 0 % (ref 0.0–0.2)

## 2024-01-19 LAB — LACTATE DEHYDROGENASE: LDH: 213 U/L — ABNORMAL HIGH (ref 98–192)

## 2024-01-19 LAB — VITAMIN B12: Vitamin B-12: 2243 pg/mL — ABNORMAL HIGH (ref 180–914)

## 2024-01-19 LAB — FERRITIN: Ferritin: 12 ng/mL — ABNORMAL LOW (ref 24–336)

## 2024-01-19 LAB — FOLATE: Folate: >40 ng/mL (ref >5.9)

## 2024-01-19 MED ORDER — OXYCODONE HCL ER 10 MG PO T12A: 10.0000 mg | EXTENDED_RELEASE_TABLET | Freq: Two times a day (BID) | ORAL | 0 refills | Status: DC

## 2024-01-19 NOTE — Progress Notes (Signed)
 [facility]  Patient presented to clinic, with spouse by his side, for his scheduled appointment with PA-C Delores Fester. I introduced myself and provided them with my direct contact information. Patient and spouse were both encouraged to call me with any questions/concerns they may have.  Esperanza Hedges, RN, BSN, [facility] Nurse Navigator, [facility] 01/19/2024 4:16 PM

## 2024-01-19 NOTE — Progress Notes (Signed)
 [facility] [phone]   [phone]  INITIAL CONSULT NOTE  Patient Care Team: Angelique Barer, MD as PCP - General (Internal Medicine) Lei Pump, MD as PCP - Electrophysiology (Cardiology) Audery Blazing Deannie Fabian, MD as PCP - Cardiology (Cardiology) Audery Blazing Deannie Fabian, MD as Consulting Physician (Cardiology) Merriam Abbey, DO as Consulting Physician (Neurology) Annella Barrows, RN as Oncology Nurse Navigator (Medical Oncology)  CHIEF COMPLAINTS/PURPOSE OF CONSULTATION:  Bone lesions  HISTORY OF PRESENTING ILLNESS:  Eliodoro Guerin 68 y.o. male with medical history significant for   On review of the previous records, Mr. Suderman was evaluated at [facility] for management of spasmodic torticollis which he receives Botox injections. He underwent CT chest on 12/30/2023 due to history of thoracic aortic aneurysm. Findings showed sclerotic bony lesions are present. The largest is in the T3 vertebral body measuring 1.7 cm.   On exam today, Mr. Bendavid reports chronic fatigue which has worsened lately.  He is able to complete his basic ADLs on his own but does need assistance otherwise.  He has poor balance and uses a cane for ambulation for short distances and a wheelchair for longer distances.  He has chronic low back pain, neck and shoulder pain that is currently managed with half a tablet of Percocet every 3 hours along with tramadol  for breakthrough pain.  He reports the pain is not well-controlled and he rates the pain a 7 out of 10 on the pain scale.  In addition, he has new onset of mid back pain that does radiate down his legs.  This has been present for the last few months.  He is complaining of left lower quadrant discomfort with chronic constipation.  He is scheduled to undergo a colonoscopy on February 14, 2024 with Dr. Honey Lusty.  He denies any overt signs of bleeding including hematochezia or melena.  He does have some new onset of  shortness of breath with exertion and at rest.  He denies fevers, chills, night sweats, chest pain, cough, nausea, vomiting, headaches or dizziness.  He has no other complaints.  Rest of the 10 point ROS as below. MEDICAL HISTORY:  Past Medical History:  Diagnosis Date   Anxiety and depression    Ascending aorta dilation (HCC) 03/06/2019   4.3 cm 2022   Benign prostatic hyperplasia without lower urinary tract symptoms 11/17/2016   Last Assessment & Plan:  Stable PSA today.  Formatting of this note might be different from the original. Last Assessment & Plan:  Stable PSA today. Last Assessment & Plan:  Formatting of this note might be different from the original. Stable PSA today.   BPH (benign prostatic hyperplasia)    Bradycardia 02/01/2019   Cervical myelopathy (HCC) 11/14/2018   Charcot's joint of foot 03/05/2019   Chronic back pain 04/03/2013   Chronic inflammatory arthritis 11/17/2016   History of positive rheumatoid factor in the past. Denies placement on immunosuppressive therapy   Chronic pain syndrome    Failed back surgical syndrome.  Chronic neck pain.  Chronic low back pain with sciatica bilateral   Colon cancer screening    2023 cologuard neg   Complete heart block (HCC)    Following AVR s/p St Jude PPM in 03/2017   DM2 (diabetes mellitus, type 2) (HCC) 04/03/2013   Last Assessment & Plan:  Formatting of this note might be different from the original. Diabetes is unchanged.  Continue current treatment regimen. Regular aerobic exercise. Diabetes will be reassessed in  3 months. Will check A1c today. Denies any problems with feet or sensation.   Elevated PSA 11/2021   Enthesopathy of ankle and tarsus 04/04/2013   Last Assessment & Plan:  Continues with swelling after prolonged standing.  Still  Wearing support hose, which helps.   Essential hypertension 10/30/2018   FHx: migraine headaches 04/04/2013   Fracture of capitate bone of wrist 02/26/2019   Fracture of tibia 03/05/2019    GERD (gastroesophageal reflux disease) 10/30/2018   Glaucoma 04/03/2013   Hemangioma of skin and subcutaneous tissue 07/07/2017   History of aortic valve replacement 2018   bicuspid AV   History of kidney stones 11/17/2016   History of sleep apnea    wt loss->gone   Hypothyroidism 10/30/2018   IDA (iron deficiency anemia) 11/2021   malabs d/t gastric bipass   Lesion of plantar nerve 04/04/2013   Lumbar stenosis with neurogenic claudication 01/08/2021   Major depressive disorder, recurrent (HCC)    ECT in the remote past.  Patient disabled due to recurrent depression and chronic pain.   Migraine syndrome    Mixed dyslipidemia 10/30/2018   Morbid obesity (HCC) 10/27/2011   Nondisplaced fracture of medial cuneiform of left foot, initial encounter for closed fracture 11/16/2018   Osteoarthritis 10/30/2018   Personal history of tobacco use, presenting hazards to health 04/04/2013   Presence of permanent cardiac pacemaker    Severe single current episode of major depressive disorder, without psychotic features (HCC) 11/17/2016   Status post bariatric surgery 04/05/2011   bipass surg --presurg wt 310 lbs   Syncope 02/14/2019   Vitamin D  deficiency 10/30/2018    SURGICAL HISTORY: Past Surgical History:  Procedure Laterality Date   ANTERIOR CERVICAL DECOMP/DISCECTOMY FUSION N/A 11/17/2018   Procedure: CERVICAL THREE-CERVICAL FOUR, CERVICAL FOUR-CERVICAL FIVE, CERVICAL FIVE-CERVICAL SIX ANTERIOR CERVICAL DECOMPRESSION/DISCECTOMY FUSION;  Surgeon: Agustina Aldrich, MD;  Location: [facility] OR;  Service: Neurosurgery;  Laterality: N/A;   AORTIC VALVE REPLACEMENT  03/2017   APPLICATION OF WOUND VAC Left 04/05/2019   Procedure: Application Of Wound Vac;  Surgeon: Donnamarie Gables, MD;  Location: [facility] OR;  Service: Orthopedics;  Laterality: Left;   BACK SURGERY     x4   CARDIOVASCULAR STRESS TEST  05/05/2021   lexiscan  neg   FASCIOTOMY Left 04/05/2019   Left leg 2 compartment fasciotomy    FASCIOTOMY Left 04/05/2019   Procedure: FASCIOTOMY LEFT LOWER LEG;  Surgeon: Donnamarie Gables, MD;  Location: [facility] OR;  Service: Orthopedics;  Laterality: Left;   FOOT NEUROMA SURGERY     GASTRIC BYPASS  2012   HEMATOMA EVACUATION Left 04/05/2019   Procedure: Evacuation Hematoma Left Lower Leg;  Surgeon: Donnamarie Gables, MD;  Location[facility] OR;  Service: Orthopedics;  Laterality: Left;   HEMORRHOID SURGERY     over 30 years ago   HERNIA REPAIR     LIH umb   I & D EXTREMITY Left 04/13/2022   Procedure: IRRIGATION AND DEBRIDEMENT OF ELBOW AND BURSECTOMY;  Surgeon: Arvil Birks, MD;  Location: [facility] OR;  Service: Orthopedics;  Laterality: Left;   LUMBAR LAMINECTOMY/DECOMPRESSION MICRODISCECTOMY N/A 01/08/2021   Procedure: CENTRAL LUMBAR LAMINECTOMY LUMBAR TWO-THREE, LUMBAR THREE-FOUR;  Surgeon: Alphonso Jean, MD;  Location: [facility] OR;  Service: Orthopedics;  Laterality: N/A;   NASAL SINUS SURGERY     x2   NECK SURGERY     OLECRANON BURSECTOMY  2023   d/t septic bursitis   TRANSESOPHAGEAL ECHOCARDIOGRAM (CATH LAB) N/A 01/02/2024   Procedure: TRANSESOPHAGEAL ECHOCARDIOGRAM;  Surgeon:  Croitoru, Mihai, MD;  Location: [facility] INVASIVE CV LAB;  Service: Cardiovascular;  Laterality: N/A;   TRANSTHORACIC ECHOCARDIOGRAM  12/2020   EF normal, mod MR, ascending AA (4.4 cm)    SOCIAL HISTORY: Social History   Socioeconomic History   Marital status: Married    Spouse name: susan    Number of children: 1   Years of education: Not on file   Highest education level: Bachelor's degree (e.g., BA, AB, BS)  Occupational History   Not on file  Tobacco Use   Smoking status: Never   Smokeless tobacco: Never  Vaping Use   Vaping status: Never Used  Substance and Sexual Activity   Alcohol use: Not Currently   Drug use: No   Sexual activity: Not on file  Other Topics Concern   Not on file  Social History Narrative   Patient lives at home with spouse Amalia Badder   Educ: BS   Occup: retired Banker.   Disabled due to depression and pain.   Tobacco: None   Alc: none   Right handed   Social Drivers of Corporate investment banker Strain: Low Risk  (05/30/2019)   Received from [facility], [facility]   Overall Financial Resource Strain (CARDIA)    Difficulty of Paying Living Expenses: Not hard at all  Food Insecurity: No Food Insecurity (05/30/2019)   Received from [facility], [facility]   Hunger Vital Sign    Worried About Running Out of Food in the Last Year: Never true    Ran Out of Food in the Last Year: Never true  Transportation Needs: Unmet Transportation Needs (05/30/2019)   Received from [facility], [facility]   PRAPARE - Transportation    Lack of Transportation (Medical): Yes    Lack of Transportation (Non-Medical): Yes  Physical Activity: Not on file  Stress: Not on file  Social Connections: Unknown (01/06/2023)   Received from [facility], [facility]   Social Network    Social Network: Not on file  Intimate Partner Violence: Unknown (01/06/2023)   Received from [facility], [facility]   HITS    Physically Hurt: Not on file    Insult or Talk Down To: Not on file    Threaten Physical Harm: Not on file    Scream or Curse: Not on file    FAMILY HISTORY: Family History  Problem Relation Age of Onset   Cancer Mother        breast, likely diagnosed less than 70 years of age.   Rheum arthritis Mother    Diabetes Mother    Heart disease Father    Kidney disease Father    Diabetes Father    Hypertension Brother    Diabetes Brother    Osteoarthritis Brother    Heart disease Brother    Hypertension Brother    Diabetes Brother    Rheum arthritis Brother    Depression Daughter    Anxiety disorder Daughter     ALLERGIES:  is allergic to calcium  carbonate and ms contin [morphine].  MEDICATIONS:  Current Outpatient Medications  Medication Sig Dispense Refill   amoxicillin  (AMOXIL ) 500 MG capsule TAKE 4 CAPSULES BY MOUTH 1 HOUR  PRIOR TO DENTAL APPOINTMENT 4 capsule 0   aspirin  EC 81 MG tablet Take 81 mg by mouth daily.     atorvastatin  (LIPITOR) 20 MG tablet Take 20 mg by mouth daily at 6 PM.      baclofen (LIORESAL) 10 MG tablet Take 10  mg by mouth 3 (three) times daily.     buPROPion  (WELLBUTRIN  SR) 200 MG 12 hr tablet Take 200 mg by mouth 2 (two) times daily.     busPIRone  (BUSPAR ) 10 MG tablet Take 20 mg by mouth 2 (two) times daily.     CALCIUM  CITRATE PO Take 500 mg by mouth 2 (two) times daily.     Cholecalciferol  (VITAMIN D -3 PO) Take 5,000 capsules by mouth daily.     clobetasol (TEMOVATE) 0.05 % external solution Apply 1 Application topically as needed.     clonazePAM  (KLONOPIN ) 0.5 MG tablet Take 0.5 mg by mouth 2 (two) times daily as needed for anxiety.     diclofenac Sodium (VOLTAREN) 1 % GEL Apply 1 application  topically as needed.     dicyclomine (BENTYL) 20 MG tablet Take 20 mg by mouth 4 (four) times daily as needed.     docusate sodium  (COLACE) 100 MG capsule Take 1 capsule (100 mg total) by mouth 2 (two) times daily. 10 capsule 0   DULoxetine  (CYMBALTA ) 60 MG capsule Take 60 mg by mouth 2 (two) times daily.      ferrous sulfate  325 (65 FE) MG EC tablet Take 325 mg by mouth daily.     finasteride  (PROSCAR ) 5 MG tablet Take 5 mg by mouth daily.     fluticasone  (FLONASE ) 50 MCG/ACT nasal spray Place 2 sprays into the nose daily as needed for allergies or rhinitis.     folic acid  (FOLVITE ) 1 MG tablet Take 1 mg by mouth daily.     furosemide  (LASIX ) 20 MG tablet Take 1 tablet (20 mg total) by mouth as needed (for leg swelling). 30 tablet 4   Galcanezumab -gnlm (EMGALITY ) 120 MG/ML SOAJ Inject 120 mg into the skin every 28 (twenty-eight) days. 1 mL 5   hydrALAZINE  (APRESOLINE ) 10 MG tablet Take 1 tablet BY MOUTH three times daily for systolic bp > 140 or diastolic bp > 90 063 tablet 3   insulin  lispro (HUMALOG) 100 UNIT/ML injection as directed Injection as needed     levothyroxine  (SYNTHROID ) 125 MCG  tablet Take 125 mcg by mouth daily.      lidocaine  (LIDODERM ) 5 % as needed.     loratadine  (CLARITIN ) 10 MG tablet Take 10 mg by mouth daily as needed for allergies.     Magnesium  250 MG TABS 2 tablets (500mg ) Orally Twice a day     MAGNESIUM  PO Take 500 mg by mouth in the morning and at bedtime.     meclizine (ANTIVERT) 25 MG tablet Take 25 mg by mouth 3 (three) times daily as needed for dizziness.     metFORMIN  (GLUCOPHAGE ) 1000 MG tablet Take 1,000 mg by mouth 2 (two) times daily with a meal.     mexiletine (MEXITIL) 250 MG capsule Take 1 capsule (250 mg total) by mouth 2 (two) times daily. 60 capsule 3   mirtazapine  (REMERON ) 30 MG tablet 1 tablet at bedtime Orally Once a day     Multiple Vitamins-Minerals (MULTIVITAMIN WITH MINERALS) tablet Take 1 tablet by mouth daily.     Multiple Vitamins-Minerals (PRESERVISION/LUTEIN) CAPS Take 1 capsule by mouth at bedtime.     mupirocin  ointment (BACTROBAN ) 2 % Apply 1 application topically 3 (three) times daily as needed (infection).     naloxone  (NARCAN ) nasal spray 4 mg/0.1 mL Instill or spray into the nose if you are experiencing shortness of breath or signs of opioid overdose. 1 each 0   omeprazole (PRILOSEC) 20 MG  capsule Take 20 mg by mouth 2 (two) times daily before a meal.      oxyCODONE -acetaminophen  (PERCOCET) 10-325 MG tablet Take 0.5 tablets by mouth See admin instructions. 1/2 tablet every 3 hours.     polyethylene glycol (MIRALAX  / GLYCOLAX ) 17 g packet Take 17 g by mouth daily as needed.     potassium chloride  (KLOR-CON ) 10 MEQ tablet Take 10 mEq by mouth as needed (with lasix  dose).     potassium chloride  SA (KLOR-CON  M) 20 MEQ tablet Take 20 mEq by mouth as needed.     pregabalin  (LYRICA ) 100 MG capsule Take 100 mg by mouth 3 (three) times daily.     pseudoephedrine (SUDAFED) 30 MG tablet Take 30 mg by mouth every 6 (six) hours as needed.     Semaglutide ,0.25 or 0.5MG /DOS, (OZEMPIC , 0.25 OR 0.5 MG/DOSE,) 2 MG/3ML SOPN Inject 0.5 mg  into the skin once a week. 3 mL 3   silodosin (RAPAFLO) 8 MG CAPS capsule Take 8 mg by mouth daily.     solifenacin (VESICARE) 10 MG tablet Take 10 mg by mouth at bedtime.     tiZANidine  (ZANAFLEX ) 4 MG tablet Take 4 mg by mouth 3 (three) times daily.     traMADol  (ULTRAM ) 50 MG tablet Take 50 mg by mouth 3 (three) times daily.     Ubrogepant  (UBRELVY ) 100 MG TABS Take 1 tablet (100 mg total) by mouth as needed (May repeat in 2 hours.  Maximum 2 tablets in 24 hours.). 48 tablet 3   No current facility-administered medications for this visit.    REVIEW OF SYSTEMS:   Constitutional: ( - ) fevers, ( - )  chills , ( - ) night sweats Eyes: ( - ) blurriness of vision, ( - ) double vision, ( - ) watery eyes Ears, nose, mouth, throat, and face: ( - ) mucositis, ( - ) sore throat Respiratory: ( - ) cough, ( - ) dyspnea, ( - ) wheezes Cardiovascular: ( - ) palpitation, ( - ) chest discomfort, ( - ) lower extremity swelling Gastrointestinal:  ( - ) nausea, ( - ) heartburn, ( + ) change in bowel habits Skin: ( - ) abnormal skin rashes Lymphatics: ( - ) new lymphadenopathy, ( - ) easy bruising Neurological: ( - ) numbness, ( - ) tingling, ( - ) new weaknesses Behavioral/Psych: ( - ) mood change, ( - ) new changes  All other systems were reviewed with the patient and are negative.  PHYSICAL EXAMINATION: ECOG PERFORMANCE STATUS: 1 - Symptomatic but completely ambulatory  Vitals:   01/19/24 1300  BP: 132/76  Pulse: 82  Resp: 16  Temp: 98.3 F (36.8 C)  SpO2: 100%   Filed Weights   01/19/24 1300  Weight: 195 lb 9.6 oz (88.7 kg)    GENERAL: well appearing male in NAD  SKIN: skin color, texture, turgor are normal, no rashes or significant lesions EYES: conjunctiva are pink and non-injected, sclera clear LUNGS: clear to auscultation and percussion with normal breathing effort HEART: regular rate & rhythm and no murmurs. Bilateral lower extremity edema Musculoskeletal: no cyanosis of digits  and no clubbing  PSYCH: alert & oriented x 3, fluent speech NEURO: no focal motor/sensory deficits  LABORATORY DATA:  I have reviewed the data as listed    Latest Ref Rng & Units 01/19/2024    2:15 PM 12/27/2023    4:19 PM 10/25/2022   11:05 AM  CBC  WBC 4.0 - 10.5 K/uL 6.2  6.1  5.6   Hemoglobin 13.0 - 17.0 g/dL 40.9  9.9  81.1   Hematocrit 39.0 - 52.0 % 33.0  32.6  33.6   Platelets 150 - 400 K/uL 252  242  233        Latest Ref Rng & Units 01/19/2024    2:15 PM 12/27/2023    4:19 PM 12/27/2023   12:53 PM  CMP  Glucose 70 - 99 mg/dL 914  782    BUN 8 - 23 mg/dL 16  15    Creatinine 9.56 - 1.24 mg/dL 2.13  0.86  5.78   Sodium 135 - 145 mmol/L 140  139    Potassium 3.5 - 5.1 mmol/L 4.7  4.9    Chloride 98 - 111 mmol/L 107  108    CO2 22 - 32 mmol/L 25  21    Calcium  8.9 - 10.3 mg/dL 9.3  9.2    Total Protein 6.5 - 8.1 g/dL 6.9     Total Bilirubin 0.0 - 1.2 mg/dL 0.4     Alkaline Phos 38 - 126 U/L 92     AST 15 - 41 U/L 14     ALT 0 - 44 U/L 17       RADIOGRAPHIC STUDIES: I have personally reviewed the radiological images as listed and agreed with the findings in the report. CUP PACEART REMOTE DEVICE CHECK Result Date: 01/17/2024 PPM Scheduled remote reviewed. Normal device function.  Presenting rhythm: AS/AP-VP, PVCs. 10 VHR detections, EGMs consistent with brief RV lead noise, not a new finding.  Multiple out of range measurements noted on RV autocapture trend. Next remote 91 days. - CS, CVRS  CT ANGIO CHEST AORTA W/CM & OR WO/CM Result Date: 01/03/2024 CLINICAL DATA:  Acute aortic syndrome EXAM: CT ANGIOGRAPHY CHEST WITH CONTRAST TECHNIQUE: Multidetector CT imaging of the chest was performed using the standard protocol during bolus administration of intravenous contrast. Multiplanar CT image reconstructions and MIPs were obtained to evaluate the vascular anatomy. Multiplanar image (3D post-processing) reconstructions and MIPs were obtained to evaluate the vascular anatomy.  RADIATION DOSE REDUCTION: This exam was performed according to the departmental dose-optimization program which includes automated exposure control, adjustment of the mA and/or kV according to patient size and/or use of iterative reconstruction technique. CONTRAST:  75mL OMNIPAQUE  IOHEXOL  350 MG/ML SOLN COMPARISON:  CT of the chest performed December 09, 2022 FINDINGS: Cardiovascular: Heart size at the upper limits of. No pericardial effusion. Postsurgical changes from aortic valve replacement. Implantable device leads terminate in the right heart. Postsurgical changes from sternotomy. Three vessel aortic arch. Minimal atherosclerotic changes are present in the descending thoracic aorta. Sinus of Valsalva: 40 mm x 30 mm Sinotubular junction: 35 mm x 39 mm Tubular ascending thoracic aorta: 40 mm x 44 mm Aortic arch: 30 mm x 32 mm Proximal descending thoracic aorta: 32 mm x 30 mm Descending thoracic aorta at the level of the aortic hiatus: 33 mm x 32 mm Main pulmonary artery: Within normal limits Mediastinum/Nodes: No enlarged mediastinal, hilar, or axillary lymph nodes. Thyroid  gland, trachea, and esophagus demonstrate no significant findings. Lungs/Pleura: Small subpleural pulmonary nodules are present which are similar when compared the previous exam. The largest is in the right middle lobe on image 106 of series 4. Upper Abdomen: The celiac origin is abnormal in appearance with suspected underlying dissection and superimposed aneurysm measuring 1.4 cm, not significantly changed. Musculoskeletal: Sclerotic bony lesions are identified on the current exam. The largest such lesion is in the  T3 vertebral body and measures 1.7 x 1.7 cm. Image 40 of series 4. Smaller sclerotic densities are present at additional sites to include 2 small sclerotic densities in the T4 vertebral body. Review of the MIP images confirms the above findings. IMPRESSION: 1. Thoracic aortic aneurysm measures 4.4 x 4.0 cm. 2. Recommend annual imaging  followup by CTA or MRA. This recommendation follows 2010 ACCF/AHA/AATS/ACR/ASA/SCA/SCAI/SIR/STS/SVM Guidelines for the Diagnosis and Management of Patients with Thoracic Aortic Disease. Circulation. 2010; 121: Z610-R604. Aortic aneurysm NOS (ICD10-I71.9) 3. Sclerotic bony lesions are present. The largest is in the T3 vertebral body measuring 1.7 cm. These imaging features are suspicious for bony metastatic disease. Consider correlation with PSA level. Consideration can also be given toward further evaluation by MRI or bone scan. 4. Small subpleural pulmonary nodules are similar when compared to the previous exam. These results will be called to the ordering clinician or representative by the Radiologist Assistant, and communication documented in the PACS or [employer]. Electronically Signed   By: Reagan Camera M.D.   On: 01/03/2024 08:09   ECHO TEE Result Date: 01/02/2024    TRANSESOPHOGEAL ECHO REPORT   Patient Name:   Antonio Oliver Date of Exam: 01/02/2024 Medical Rec #:  [id number]       Height:       75.0 in Accession #:    [id number]      Weight:       193.0 lb Date of Birth:  1956-06-28        BSA:          2.161 m Patient Age:    67 years        BP:           142/95 mmHg Patient Gender: M               HR:           78 bpm. Exam Location:  Outpatient Procedure: Transesophageal Echo, 3D Echo, Color Doppler and Cardiac Doppler            (Both Spectral and Color Flow Doppler were utilized during            procedure). Indications:     aortic valve disorder.  History:         Patient has prior history of Echocardiogram examinations, most                  recent 12/12/2023. Pacemaker; Risk Factors:Diabetes, Hypertension                  and Dyslipidemia.                  Aortic Valve: St. Jude valve is present in the aortic position.                  Procedure Date: 03/10/17.  Sonographer:     Dione Franks RDCS Referring Phys:  [phone] MIHAI CROITORU Diagnosing Phys: Luana Rumple MD PROCEDURE: After  discussion of the risks and benefits of a TEE, an informed consent was obtained from the patient. The transesophogeal probe was passed without difficulty through the esophogus of the patient. Imaged were obtained with the patient in a left lateral decubitus position. Sedation performed by different physician. The patient was monitored while under deep sedation. Anesthestetic sedation was provided intravenously by Anesthesiology: 120mg  of Propofol , 50mg  of Lidocaine . The patient developed no complications during the procedure.  IMPRESSIONS  1. Left ventricular ejection fraction, by estimation,  is 60 to 65%. The left ventricle has normal function. The left ventricle has no regional wall motion abnormalities. There is mild concentric left ventricular hypertrophy.  2. Right ventricular systolic function is normal. The right ventricular size is mildly enlarged. There is normal pulmonary artery systolic pressure. The estimated right ventricular systolic pressure is 23.0 mmHg.  3. Left atrial size was mildly dilated. No left atrial/left atrial appendage thrombus was detected.  4. By the [facility] method, the effective regurgitant orifice area is 0.26 cm sq, regurgitant volume is 54 ml and the regurgitant fraction is 45%. The mitral valve is degenerative. Moderate mitral valve regurgitation. There is mild holosystolic prolapse of the middle segment of the anterior leaflet of the mitral valve.  5. The aortic prosthetic leaflets appear thickened. The "left" cusp is almost completely immobile, while the "right" cusp has partially restricted mobility. The "noncoronary cusp moves normally. Bulky thrombus or vegetation is not seen. The aortic valve  has been repaired/replaced. Aortic valve regurgitation is not visualized. Moderate aortic valve stenosis. There is a St. Jude valve present in the aortic position. Procedure Date: 03/10/17. Aortic valve mean gradient measures 22.6 mmHg. Aortic valve Vmax measures 3.07 m/s. Aortic valve  acceleration time measures 99 msec.  6. There is mild dilatation of the ascending aorta, measuring 42 mm.  7. 3D performed of the mitral valve and 3D performed of the aortic valve bioprosthesis. Comparison(s): The aortic valve prosthesis gradients have worsened since 2024 and the mechanism appears to be prosthetic leaflet degeneration. Mitral insufficiency remains unchanged. FINDINGS  Left Ventricle: Left ventricular ejection fraction, by estimation, is 60 to 65%. The left ventricle has normal function. The left ventricle has no regional wall motion abnormalities. The left ventricular internal cavity size was normal in size. There is  mild concentric left ventricular hypertrophy. Abnormal (paradoxical) septal motion consistent with post-operative status. Right Ventricle: The right ventricular size is mildly enlarged. No increase in right ventricular wall thickness. Right ventricular systolic function is normal. There is normal pulmonary artery systolic pressure. The tricuspid regurgitant velocity is 2.12  m/s, and with an assumed right atrial pressure of 5 mmHg, the estimated right ventricular systolic pressure is 23.0 mmHg. Left Atrium: Left atrial size was mildly dilated. No left atrial/left atrial appendage thrombus was detected. Right Atrium: Right atrial size was normal in size. Pericardium: There is no evidence of pericardial effusion. Mitral Valve: By the PiSA method, the effective regurgitant orifice area is 0.26 cm sq, regurgitant volume is 54 ml and the regurgitant fraction is 45%. The mitral valve is degenerative in appearance. There is mild holosystolic prolapse of the middle segment of the anterior leaflet of the mitral valve. There is moderate thickening of the anterior mitral valve leaflet(s). Moderate mitral valve regurgitation, with eccentric laterally directed jet. Tricuspid Valve: The tricuspid valve is normal in structure. Tricuspid valve regurgitation is mild. Aortic Valve: The aortic prosthetic  leaflets appear thickened. The "left" cusp is almost completely immobile, while the "right" cusp has partially restricted mobility. The "noncoronary cusp moves normally. Bulky thrombus or vegetation is not seen. The aortic valve has been repaired/replaced. Aortic valve regurgitation is not visualized. Moderate aortic stenosis is present. Aortic valve mean gradient measures 22.6 mmHg. Aortic valve peak gradient measures 37.8 mmHg. Aortic valve area, by VTI measures 1.04 cm. There is a St. Jude valve present in the aortic position. Procedure Date: 03/10/17. Pulmonic Valve: The pulmonic valve was normal in structure. Pulmonic valve regurgitation is mild. No evidence of pulmonic stenosis. Aorta:  The aortic root is normal in size and structure. There is mild dilatation of the ascending aorta, measuring 42 mm. There is minimal (Grade I) plaque involving the descending aorta. IAS/Shunts: No atrial level shunt detected by color flow Doppler. Additional Comments: A device lead is visualized in the right ventricle and right atrium. LEFT VENTRICLE PLAX 2D LVOT diam:     2.10 cm LV SV:         67 LV SV Index:   31 LVOT Area:     3.46 cm  AORTIC VALVE AV Area (Vmax):    0.99 cm AV Area (Vmean):   0.89 cm AV Area (VTI):     1.04 cm AV Vmax:           307.37 cm/s AV Vmean:          224.958 cm/s AV VTI:            0.640 m AV Peak Grad:      37.8 mmHg AV Mean Grad:      22.6 mmHg LVOT Vmax:         87.80 cm/s LVOT Vmean:        57.600 cm/s LVOT VTI:          0.192 m LVOT/AV VTI ratio: 0.30 MR Peak grad: 148.8 mmHg  TRICUSPID VALVE MR Vmax:      609.82 cm/s TR Peak grad:   18.0 mmHg MR Vmean:     481.5 cm/s  TR Vmax:        212.00 cm/s                            SHUNTS                           Systemic VTI:  0.19 m                           Systemic Diam: 2.10 cm Luana Rumple MD Electronically signed by Luana Rumple MD Signature Date/Time: 01/02/2024/2:26:52 PM    Final    EP STUDY Result Date: 01/02/2024 See surgical note  for result.  LONG TERM MONITOR (3-14 DAYS) Result Date: 12/29/2023 Patch Wear Time:  13 days and 10 hours HR 59 - 174, average 76 bpm. No atrial fibrillation detected. Rare supraventricular ectopy. Occasional ventricular ectopy, 1.7%. Multiple runs of VT, longest 57 seconds rate 112 bpm, possible V pacing Symptom trigger episodes correspond to PVC Will [facility]   ASSESSMENT & PLAN Antonio Oliver is a 68 y.o. male who presents to the rapid diagnostic clinic for evaluation of abnormal CT chest concerning for sclerotic bone lesions in the thoracic vertebrae.   #Bone lesions: --Seen on CT chest from 12/27/2023 --Discussed possible etiologies including benign process, bone marrow malignancies such as multiple myeloma, metastatic disease to the bone.  --Labs today to check CBC, CMP, LDH, SPEP with IFE, serum free light chains and PSA level --Will obtain CT abdomen and pelvis to evaluate for other target lesions to biopsy. If there is no other target lesion identified on upcoming CT scan, we will request a bone biopsy.  --RTC once workup is complete   #Back pain: --Currently pain regimen with percocet +/- tramadol  is not effective. --Will send long acting oxycontin  10 mg every 12 hours. Continue to take percocet as needed for breakthrough pain.   #H/O gastric  bypass #Anemia: --Noted on prior labs that is chronic in nature but worsening --Will check nutritional labs today with iron/B12/folate levels --Currently taking SL B12 replacement and PO iron therapy.    #LLQ pain #Constipation: --Scheduled to undergo colonoscopy on 02/14/2024 with Dr. Honey Lusty.  --Patient denies any recent colonoscopies (greater than 10 years) with prior cologuard studies done which was normal.  Orders Placed This Encounter  Procedures   CBC with Differential (Cancer Center Only)    Standing Status:   Future    Number of Occurrences:   1    Expiration Date:   01/18/2025   CMP  (Cancer Center only)    Standing Status:   Future    Number of Occurrences:   1    Expiration Date:   01/18/2025   Ferritin    Standing Status:   Future    Number of Occurrences:   1    Expiration Date:   01/18/2025   Iron and Iron Binding Capacity ([facility] only)    Standing Status:   Future    Number of Occurrences:   1    Expiration Date:   01/18/2025   Methylmalonic acid, serum    Standing Status:   Future    Number of Occurrences:   1    Expiration Date:   01/18/2025   Vitamin B12    Standing Status:   Future    Number of Occurrences:   1    Expiration Date:   01/18/2025   Folate, Serum    Standing Status:   Future    Number of Occurrences:   1    Expiration Date:   01/18/2025   Multiple Myeloma Panel (SPEP&IFE w/QIG)    Standing Status:   Future    Number of Occurrences:   1    Expiration Date:   01/18/2025   Kappa/lambda light chains    Standing Status:   Future    Number of Occurrences:   1    Expiration Date:   01/18/2025   Prostate-Specific AG, Serum    Standing Status:   Future    Number of Occurrences:   1    Expiration Date:   01/18/2025   Lactate dehydrogenase (LDH)    Standing Status:   Future    Number of Occurrences:   1    Expiration Date:   01/18/2025    All questions were answered. The patient knows to call the clinic with any problems, questions or concerns.  I have spent a total of 60 minutes minutes of face-to-face and non-face-to-face time, preparing to see the patient, obtaining and/or reviewing separately obtained history, performing a medically appropriate examination, counseling and educating the patient, ordering medications/tests/procedures, referring and communicating with other health care professionals, documenting clinical information in the electronic health record, independently interpreting results and communicating results to the patient, and care coordination.   Wyline Hearing, PA-C Department of Hematology/Oncology [facility]  at [facility] Phone: [phone]  Patient was seen with Dr. Randye Buttner.

## 2024-01-19 NOTE — Patient Instructions (Signed)
 Diagnostic Clinic Office Visit Discharge Information and Instructions  Thank you for choosing [facility] for your healthcare needs.  Below is a summary of today's discussion, along with our contact information and an outline of what to expect next.  Reason for Visit:  Bone lesion in thoracic vertebrae  Proposed Diagnostic Care Plan: Labs today CT abdomen and pelvis scheduled for 01/20/2024 Biopsy based on CT imaging.   What to Expect: - Generally, when lab tests are ordered the results can take up to 1 week for results to be available.  At that point, we will contact you to discuss your results with you.  Unless there is a critical result, we will typically wait for all of your lab results to be available before contacting you. - If a biopsy is part of your Care Plan, those results can take on average 7-10 days to result.  Once results are available, we will contact you to discuss your pathology results and any next steps. - If you have additional imaging ordered, such as a CT Scan, MRI, Ultrasound, Bone Scan, or PET scan, your imaging will need to be authorized then scheduled with the earliest available appointment.  You may be asked to travel to another hospital within [facility] who has a sooner availability, please consider doing so if asked. - If you use MyChart, your results will be available to you in the MyChart portal.  Your provider will be in touch with you as soon as all of your results are available to be discussed.  Your Diagnostic Clinic Provider:  Wyline Hearing PA-C and Dr. Randye Buttner, contact number [phone] Your Diagnostic Navigator:  Jetta Morrow, RN, contact number [phone]  If you or your caregiver have number blocking on your cell phones, please ensure the cancer center's numbers are not blocked.  If you are not a registered MyChart user, please consider enrolling in MyChart to receive your test results and visit notes.  You can also access your discharge  instructions electronically.  MyChart also gives you an electronic means to communicate with your Care Team instead of needing to call in to the cancer center.  We appreciate you trusting us  with your healthcare and look forward to partnering with you as we work to uncover what your potential diagnosis may be.  Please do not hesitate to reach out at any point with questions or concerns.

## 2024-01-20 ENCOUNTER — Other Ambulatory Visit: Payer: Self-pay | Admitting: Physician Assistant

## 2024-01-20 ENCOUNTER — Telehealth: Payer: Self-pay

## 2024-01-20 ENCOUNTER — Other Ambulatory Visit (HOSPITAL_COMMUNITY): Payer: Self-pay

## 2024-01-20 ENCOUNTER — Ambulatory Visit (HOSPITAL_COMMUNITY)

## 2024-01-20 ENCOUNTER — Telehealth: Payer: Self-pay | Admitting: Cardiology

## 2024-01-20 ENCOUNTER — Encounter: Payer: Self-pay | Admitting: Medical Oncology

## 2024-01-20 DIAGNOSIS — M899 Disorder of bone, unspecified: Secondary | ICD-10-CM

## 2024-01-20 DIAGNOSIS — D509 Iron deficiency anemia, unspecified: Secondary | ICD-10-CM | POA: Insufficient documentation

## 2024-01-20 DIAGNOSIS — D508 Other iron deficiency anemias: Secondary | ICD-10-CM

## 2024-01-20 DIAGNOSIS — R972 Elevated prostate specific antigen [PSA]: Secondary | ICD-10-CM

## 2024-01-20 LAB — PROSTATE-SPECIFIC AG, SERUM (LABCORP): Prostate Specific Ag, Serum: 99.7 ng/mL — ABNORMAL HIGH (ref 0.0–4.0)

## 2024-01-20 LAB — KAPPA/LAMBDA LIGHT CHAINS
Kappa free light chain: 39.8 mg/L — ABNORMAL HIGH (ref 3.3–19.4)
Kappa, lambda light chain ratio: 1.42 (ref 0.26–1.65)
Lambda free light chains: 28 mg/L — ABNORMAL HIGH (ref 5.7–26.3)

## 2024-01-20 MED ORDER — FENTANYL 12 MCG/HR TD PT72
1.0000 | MEDICATED_PATCH | TRANSDERMAL | 0 refills | Status: DC
  Filled 2024-01-20: qty 5, 15d supply, fill #0

## 2024-01-20 NOTE — Progress Notes (Signed)
 Rapid Diagnostic Clinic  Call to patient and spoke to wife informing her of PSA results, as requested by Elise Guile. Informed wife that today's CT will be cancelled and PET (PSMA) will be scheduled. Wife also informed of patient's iron deficiency and that an appointment will be made for IV iron to be given at our [address] location and to expect call from scheduling.  Wife gave verbal understanding and denies questions at this time.   Wife informed me that insurance denied the OxyContin  prescribed yesterday and insurance was asking "if anything else was tried first?" Message sent to MD and fentanyl  patch was suggested. Spouse stated that would be fine and asked for the prescription to be sent to the [facility] outpatient pharmacy. MD informed.   PET PSMA scheduled for 5/23 at [facility], for 1:30 PM, patient to arrive for registration at 1 PM, spouse informed.   Esperanza Hedges, RN, BSN, [facility] Oncology Nurse Navigator, Rapid Diagnostic Clinic 01/20/2024 9:44 AM

## 2024-01-20 NOTE — Telephone Encounter (Signed)
 Received [employer] FMLA form today for his spouse, Danen Nicanor.  Patient is not able to get out to sign so I e-mailed the release of information to Amalia Badder as an attachment.  She will come in next week to pay the $29 form fee.Form is still with me.

## 2024-01-20 NOTE — Telephone Encounter (Signed)
 Notified Patient of prior authorization approval for Oxycodone  HCL 10 mg tablets. Medication was approved through 09/05/2024. Prescription was changed to Fentanyl  today. No other needs or concerns noted at this time.

## 2024-01-20 NOTE — Telephone Encounter (Signed)
 Delores Fester, patient will be scheduled as soon as possible.  Auth Submission: NO AUTH NEEDED Site of care: Site of care: [facility] Payer: [employer] medicare Medication & CPT/J Code(s) submitted: Venofer (Iron Sucrose) J1756 Route of submission (phone, fax, portal):  Phone # Fax # Auth type: Buy/Bill PB Units/visits requested: 200mg  x 5 doses Reference number:  Approval from: 01/20/24 to 04/21/24

## 2024-01-21 LAB — METHYLMALONIC ACID, SERUM: Methylmalonic Acid, Quantitative: 183 nmol/L (ref 0–378)

## 2024-01-23 ENCOUNTER — Other Ambulatory Visit: Payer: Self-pay | Admitting: Physician Assistant

## 2024-01-23 ENCOUNTER — Other Ambulatory Visit (HOSPITAL_COMMUNITY): Payer: Self-pay

## 2024-01-24 ENCOUNTER — Encounter: Payer: Self-pay | Admitting: Physician Assistant

## 2024-01-24 ENCOUNTER — Ambulatory Visit (INDEPENDENT_AMBULATORY_CARE_PROVIDER_SITE_OTHER): Admitting: *Deleted

## 2024-01-24 VITALS — BP 107/67 | HR 86 | Temp 98.3°F | Resp 18 | Ht 75.0 in | Wt 193.8 lb

## 2024-01-24 DIAGNOSIS — Z0279 Encounter for issue of other medical certificate: Secondary | ICD-10-CM

## 2024-01-24 DIAGNOSIS — D509 Iron deficiency anemia, unspecified: Secondary | ICD-10-CM | POA: Diagnosis not present

## 2024-01-24 LAB — MULTIPLE MYELOMA PANEL, SERUM
Albumin SerPl Elph-Mcnc: 3.6 g/dL (ref 2.9–4.4)
Albumin/Glob SerPl: 1.4 (ref 0.7–1.7)
Alpha 1: 0.2 g/dL (ref 0.0–0.4)
Alpha2 Glob SerPl Elph-Mcnc: 0.7 g/dL (ref 0.4–1.0)
B-Globulin SerPl Elph-Mcnc: 0.9 g/dL (ref 0.7–1.3)
Gamma Glob SerPl Elph-Mcnc: 0.7 g/dL (ref 0.4–1.8)
Globulin, Total: 2.6 g/dL (ref 2.2–3.9)
IgA: 176 mg/dL (ref 61–437)
IgG (Immunoglobin G), Serum: 851 mg/dL (ref 603–1613)
IgM (Immunoglobulin M), Srm: 75 mg/dL (ref 20–172)
Total Protein ELP: 6.2 g/dL (ref 6.0–8.5)

## 2024-01-24 MED ORDER — IRON SUCROSE 20 MG/ML IV SOLN
200.0000 mg | Freq: Once | INTRAVENOUS | Status: AC
  Administered 2024-01-24: 200 mg via INTRAVENOUS
  Filled 2024-01-24: qty 10

## 2024-01-24 NOTE — Patient Instructions (Signed)

## 2024-01-24 NOTE — Progress Notes (Signed)
 Diagnosis: Iron Deficiency Anemia  Provider:  Praveen Mannam MD  Procedure: IV Push  IV Type: Peripheral, IV Location: R Forearm  Venofer (Iron Sucrose), Dose: 200 mg  Post Infusion IV Care: Observation period completed  Discharge: Condition: Good, Destination: Home . AVS Declined  Performed by:  Devonne Folk, RN

## 2024-01-26 ENCOUNTER — Telehealth: Payer: Self-pay | Admitting: Cardiology

## 2024-01-26 NOTE — Telephone Encounter (Signed)
 I spoke with the pt wife and she was going to call and ask him to do the transmission. If we do not receive it in a little while we are to call to let her know and she will send the neighbor to do it for him.

## 2024-01-26 NOTE — Telephone Encounter (Signed)
 STAT if HR is under 50 or over 120  (normal HR is 60-100 beats per minute)  What is your heart rate?  Hasn't checked today, but wife says high HR is sporadic. Patient is fine right now Yesterday was 144.   Do you have a log of your heart rate readings (document readings)?   Do you have any other symptoms?  Palpitations. O2 is dropping causing SOB. Dizziness. No chest pain.   Pt c/o Shortness Of Breath: STAT if SOB developed within the last 24 hours or pt is noticeably SOB on the phone  1. Are you currently SOB (can you hear that pt is SOB on the phone)?  No   2. How long have you been experiencing SOB?  Wife unsure. She says it has been weeks or months? Ongoing, but she doesn't remember how long  3. Are you SOB when sitting or when up moving around?  Only occurs when patient has episodes of elevated HR  4. Are you currently experiencing any other symptoms?  No

## 2024-01-26 NOTE — Telephone Encounter (Signed)
 Presenting EGM looks like ST/AT with VP in the second half on the strip. See below. Secondly, continued and known RV lead noise. Lastly has had a recent increased from ~0% to 40-50% VP in the last 2-3 months. HVR episodes are noise.  Pts wife mentioned results from a zio patch that showed ventricular arrhythmias and wanting to know if mexitil should be increased to q8 instead of q12. I told her id forward all this info to the MD's.

## 2024-01-26 NOTE — Telephone Encounter (Signed)
 FMLA paperwork complete, signed and placed in the mailbox.

## 2024-01-26 NOTE — Telephone Encounter (Signed)
 Spoke with pt's wife and she stated that the pt's HR got as high as 144 yesterday and O2 as low as 63%. She states that the pt gets short of breath and dizzy when his heart rate jumps to these levels. Pt normally sits in the high 90s for O2 per wife. Pt's wife is a Teacher, early years/pre and stated she had wondered if the pt's Mexitil should be moved to every 8 hours rather than every 12 hours. Last EKG on 12/06/23 showed NSR with PVCs. Will forward to Dr. Lawana Pray and Dr. Audery Blazing to review.

## 2024-01-26 NOTE — Telephone Encounter (Signed)
 Spoke with pt, he is home alone and she is not sure if he will be able to get to where he can send a download but they will try. Follow up scheduled with app.

## 2024-01-26 NOTE — Telephone Encounter (Signed)
 Wife Amalia Badder) called to check if patient's transmission was received.

## 2024-01-27 ENCOUNTER — Other Ambulatory Visit (HOSPITAL_COMMUNITY): Payer: Self-pay

## 2024-01-27 ENCOUNTER — Ambulatory Visit (HOSPITAL_COMMUNITY)
Admission: RE | Admit: 2024-01-27 | Discharge: 2024-01-27 | Disposition: A | Discharge status: Home / Self Care | Source: Ambulatory Visit | Attending: Physician Assistant

## 2024-01-27 DIAGNOSIS — M899 Disorder of bone, unspecified: Secondary | ICD-10-CM | POA: Insufficient documentation

## 2024-01-27 DIAGNOSIS — R972 Elevated prostate specific antigen [PSA]: Secondary | ICD-10-CM | POA: Insufficient documentation

## 2024-01-27 MED ORDER — FLUDEOXYGLUCOSE F - 18 (FDG) INJECTION: 7.5500 | Freq: Once | INTRAVENOUS | Status: DC

## 2024-01-27 MED ORDER — FLOTUFOLASTAT F 18 GALLIUM 296-5846 MBQ/ML IV SOLN
7.5500 | Freq: Once | INTRAVENOUS | Status: AC
  Administered 2024-01-27: 7.55 via INTRAVENOUS

## 2024-01-27 NOTE — Telephone Encounter (Signed)
 Follow up appointment made for Pt assessment.  Await further needs.

## 2024-01-31 ENCOUNTER — Ambulatory Visit

## 2024-01-31 ENCOUNTER — Other Ambulatory Visit: Payer: Self-pay | Admitting: Physician Assistant

## 2024-01-31 VITALS — BP 117/74 | HR 85 | Temp 98.6°F | Resp 14 | Ht 75.0 in | Wt 193.0 lb

## 2024-01-31 DIAGNOSIS — D509 Iron deficiency anemia, unspecified: Secondary | ICD-10-CM

## 2024-01-31 MED ORDER — IRON SUCROSE 20 MG/ML IV SOLN
200.0000 mg | Freq: Once | INTRAVENOUS | Status: AC
  Administered 2024-01-31: 200 mg via INTRAVENOUS
  Filled 2024-01-31: qty 10

## 2024-01-31 NOTE — Progress Notes (Signed)
 Diagnosis: Iron Deficiency Anemia  Provider:  Mannam, Praveen MD  Procedure: IV Push  IV Type: Peripheral, IV Location: L Hand  Venofer (Iron Sucrose), Dose: 200 mg  Post Infusion IV Care: Observation period completed and Peripheral IV Discontinued  Discharge: Condition: Stable, Destination: Home . AVS Provided  Performed by:  Lendel Quant, RN

## 2024-02-01 ENCOUNTER — Other Ambulatory Visit (HOSPITAL_COMMUNITY): Payer: Self-pay

## 2024-02-01 ENCOUNTER — Other Ambulatory Visit: Payer: Self-pay | Admitting: Physician Assistant

## 2024-02-01 ENCOUNTER — Encounter: Payer: Self-pay | Admitting: Physician Assistant

## 2024-02-01 ENCOUNTER — Encounter: Payer: Self-pay | Admitting: Medical Oncology

## 2024-02-01 DIAGNOSIS — M899 Disorder of bone, unspecified: Secondary | ICD-10-CM

## 2024-02-01 DIAGNOSIS — R972 Elevated prostate specific antigen [PSA]: Secondary | ICD-10-CM

## 2024-02-01 MED ORDER — FENTANYL 25 MCG/HR TD PT72
1.0000 | MEDICATED_PATCH | TRANSDERMAL | 0 refills | Status: DC
  Filled 2024-02-01: qty 5, 15d supply, fill #0

## 2024-02-01 NOTE — Progress Notes (Signed)
 I notified Antonio Oliver and spouse by phone regarding PET PSMA results. Findings are positive suggesting metastatic prostate cancer. Discussed results with Dr. Randye Buttner who recommends a bone biopsy for confirmation. Biopsy ordered placed. Patient reports uncontrolled pain with fentanyl  patch 12 mcg/hr. Pain is significantly limiting his ADLS, will increase dose to 25 mcg/hr. I have reviewed the PDMP during this encounter. Once biopsy is scheduled we will schedule oncology follow up to discuss biopsy results and treatment recommendations. All of patient's questions were answered and they expressed understanding of the plan provided.

## 2024-02-01 NOTE — Progress Notes (Unsigned)
 Marland Silvas, MD  Antonio Oliver L PROCEDURE / BIOPSY REVIEW Date: 02/01/24  Requested Biopsy site: bone Reason for request: r/o prostate me Imaging review: Best seen on PET  Decision: Approved Imaging modality to perform: CT Schedule with: Moderate Sedation Schedule for: Any VIR  Additional comments: @VIR : consider L iliac set up like bone marrow, see PET  Please contact me with questions, concerns, or if issue pertaining to this request arise.  Dayne Marland Silvas, MD [facility]       Previous Messages    ----- Message ----- From: Derrell Flight Sent: 02/01/2024  11:54 AM EDT To: Rosetta Cons, MD; Derrell Flight; * Subject: CT Biopsy                                      Procedure :CT Biopsy  Reason :bone biopsy, work up for metastatic prostate cancer Dx: Elevated PSA [R97.20 (ICD-10-CM)]; Bone lesion [M89.9 (ICD-10-CM)]   History :NM PET (PSMA) SKULL TO MID THIGH,CT ANGIO CHEST AORTA W/CM & OR WO/CM  Provider: Walisiewicz, Kaitlyn E, PA-C  Provider contact :[phone]

## 2024-02-01 NOTE — Progress Notes (Signed)
 Rapid Diagnostic Clinic  Call to patient's spouse to inform her of biopsy scheduled appointment for the 4th. She confirms the date and time works for her. She was also informed that he needs to hold his aspirin  for five days prior to the biopsy, spouse confirmed understanding. Also informed her that Antonio Oliver will be scheduled with Dr. Alita Irwin for 6/17th at 10:00 AM as a new patient and Dr. Alita Irwin will review the biopsy results with them. Antonio Oliver gave her verbal understanding and denies further questions at this time. She was encouraged to call me with questions/concerns.   Esperanza Hedges, RN, BSN, [facility] Oncology Nurse Navigator, Rapid Diagnostic Clinic 02/01/2024 4:28 PM

## 2024-02-02 ENCOUNTER — Encounter: Payer: Self-pay | Admitting: Physician Assistant

## 2024-02-02 ENCOUNTER — Telehealth: Payer: Self-pay

## 2024-02-02 ENCOUNTER — Ambulatory Visit: Attending: Physician Assistant | Admitting: Cardiology

## 2024-02-02 VITALS — BP 128/84 | HR 89 | Ht 75.0 in | Wt 192.0 lb

## 2024-02-02 DIAGNOSIS — I340 Nonrheumatic mitral (valve) insufficiency: Secondary | ICD-10-CM | POA: Diagnosis not present

## 2024-02-02 DIAGNOSIS — I493 Ventricular premature depolarization: Secondary | ICD-10-CM | POA: Diagnosis not present

## 2024-02-02 DIAGNOSIS — I7121 Aneurysm of the ascending aorta, without rupture: Secondary | ICD-10-CM

## 2024-02-02 DIAGNOSIS — Z952 Presence of prosthetic heart valve: Secondary | ICD-10-CM

## 2024-02-02 NOTE — Progress Notes (Addendum)
 Yeah I think so if you Cardiology Office Note:  .   Date:  02/02/2024  ID:  Antonio Oliver, DOB 24-Sep-1955, MRN [id number] PCP: Angelique Barer, MD  [facility] Providers Cardiologist:  Alexandria Angel, MD Electrophysiologist:  Will Cortland Ding, MD {  History of Present Illness: .   Antonio Oliver is a 68 y.o. male with history of AVR 03/2017 complicated by CHB with Abbott PPM, thoracic aortic aneurysm, hypertension, mitral regurgitation, hyperlipidemia, PVCs, prostate cancer     AVR/MR Thoracic aneurysm Performed 03/2017, complicated by CHB requiring PPM Cardiac catheterization 2022 no CAD Stress test [id number] normal Echocardiogram 12/2023 with moderate MR and newly elevated mean gradient 26, dilated aortic root 44 mm TEE 12/2023 confirming prosthesis valve with worsening gradient, moderate AS, and immobile right cusp, mean gradient 22.6.  Moderate MR due to mild prolapse of anterior leaflet of MV CTA 12/2023 thoracic aneurysm 4.4 x 4 cm and also concerning features of bony metastasis.  PPM PVCs History of palpitations, beta-blocker DC'd due to hypotension Followed by EP, high burden of PVCs, heart monitor 12/2023 confirming.  Multiple runs of VT (longest run 57 seconds) mexiletine 250 mg BID.     Patient with history of AVR with increased mean gradient now 26 based off of echocardiogram April 2025.  He had been complaining of dyspnea with low blood pressure.  TEE performed showing worsening valve gradient, moderate AS, moderate MR with prolapse of anterior leaflet.  Also followed by EP who have noted increased PVC burden on heart monitor, started on mexiletine.  Today patient here for follow-up after starting his mexiletine.  He is not very contributory to interview today, wife who is a Teacher, early years/pre is speaking mainly on his behalf.  He reports no significant difference in PVC events, he reports 1-2 episodes a day.  Sometimes he has aggressive episodes where he becomes dizzy,  lightheaded, feels that his heart is going to run out of his chest.  No chest pain or shortness of breath though.  However, wife reports the occurrence of these events are occurring less frequently, suspects that they have been happening more at night.  She is inquiring about possibly increasing to every 8 dosing from twice daily.  ROS: Denies: Chest pain, shortness of breath, orthopnea, peripheral edema, palpitations, decreased exercise intolerance, fatigue, lightheadedness.   Studies Reviewed: Aaron Aas    EKG Interpretation Date/Time:  Thursday Feb 02 2024 11:31:27 EDT Ventricular Rate:  89 PR Interval:  242 QRS Duration:  154 QT Interval:  410 QTC Calculation: 498 R Axis:   -43  Text Interpretation: Sinus rhythm with 1st degree A-V block Left axis deviation Right bundle branch block Septal infarct , age undetermined When compared with ECG of 06-Dec-2023 14:28, No significant changes from prior Confirmed by Morgan Arab [phone]) on 02/02/2024 1:34:10 PM    Risk Assessment/Calculations:             Physical Exam:   VS:  BP 128/84 (BP Location: Left Arm, Patient Position: Sitting, Cuff Size: Normal)   Pulse 89   Ht 6\' 3"  (1.905 m)   Wt 192 lb (87.1 kg)   SpO2 97%   BMI 24.00 kg/m    Wt Readings from Last 3 Encounters:  02/02/24 192 lb (87.1 kg)  01/31/24 193 lb (87.5 kg)  01/24/24 193 lb 12.8 oz (87.9 kg)    GEN: Well nourished, well developed in no acute distress NECK: No JVD; No carotid bruits CARDIAC: RRR, 3 out of 6 murmur at  left sternal border and apex RESPIRATORY:  Clear to auscultation without rales, wheezing or rhonchi  ABDOMEN: Soft, non-tender, non-distended EXTREMITIES:  No edema; No deformity   ASSESSMENT AND PLAN: .    AVR/MR TTE 12/2023 noting worsening gradient, TEE performed confirming this with moderate AS with an immobile right cusp and a mean gradient 22.6.  Moderate MR noted due to mild prolapse of anterior leaflet of MV. Continue SBE prophylaxis We will  closely monitor this on an annual basis but for right now likely no need for intervention with moderate disease.  Thoracic aneurysm CTA 12/2023 measuring 4.4 x 4 cm and also concerning features of bony metastasis.  Recommending annual imaging, perform April 2026  Abbott PPM PVCs Followed by EP, normal device function on last interrogation.  Newly started on mexiletine, wife notes improvement, patient not really noticing a difference.  1-2 episodes per day of significant lightheadedness, dizziness, tachycardia.  Will review interrogation report with EP to assess efficacy of mexiletine before making any medication changes in dose or frequency. Continue mexiletine 250 mg twice daily, wife is inquiring about 3 times daily dosing.  Defer medication management to EP He has had issues with hypotension with significant autonomic dysfunction in the past, blood pressure based off multiple office visits does appear to be stable.  Wonder whether it be worth while to trial again low-dose of Toprol -XL.  Borderline prolonged QTc QTc 498, slightly increased from before.  However mexiletine should help to decrease this.  Overall seems to be stable for the past few years, likely exacerbated by underlying bundle branch block.  Autonomic dysfunction  Hypotension Followed by neurology, blood pressure today 128/84.  Prostate cancer Appears to have bone metastases.  Has appointment to follow-up on this and potential plans of biopsy.  Hyperlipidemia Managed by PCP.  LDL May 2023 76.  Continue atorvastatin  20 mg daily    Dispo: He will see Dr. Audery Blazing in July and then EP in August.  Addendum: spoke with Dr. Lawana Pray and he would like to keep patient at current regiment. Follow up as planned.   Signed, Burnetta Cart, PA-C

## 2024-02-02 NOTE — Patient Instructions (Signed)
 Medication Instructions:  NO CHANGES *If you need a refill on your cardiac medications before your next appointment, please call your pharmacy*  Lab Work: NO LABS If you have labs (blood work) drawn today and your tests are completely normal, you will receive your results only by: MyChart Message (if you have MyChart) OR A paper copy in the mail If you have any lab test that is abnormal or we need to change your treatment, we will call you to review the results.  Testing/Procedures: NO TESTING  Follow-Up: At [facility], you and your health needs are our priority.  As part of our continuing mission to provide you with exceptional heart care, our providers are all part of one team.  This team includes your primary Cardiologist (physician) and Advanced Practice Providers or APPs (Physician Assistants and Nurse Practitioners) who all work together to provide you with the care you need, when you need it.  Your next appointment:   KEEP FOLLOW UP JULY 22ND   Provider:   Alexandria Angel, MD

## 2024-02-03 ENCOUNTER — Encounter: Payer: Self-pay | Admitting: Physician Assistant

## 2024-02-07 ENCOUNTER — Telehealth: Payer: Self-pay

## 2024-02-07 ENCOUNTER — Other Ambulatory Visit: Payer: Self-pay | Admitting: Radiology

## 2024-02-07 ENCOUNTER — Ambulatory Visit

## 2024-02-07 VITALS — BP 99/65 | HR 85 | Temp 98.6°F | Resp 18 | Ht 75.0 in | Wt 196.0 lb

## 2024-02-07 DIAGNOSIS — D509 Iron deficiency anemia, unspecified: Secondary | ICD-10-CM

## 2024-02-07 DIAGNOSIS — M899 Disorder of bone, unspecified: Secondary | ICD-10-CM

## 2024-02-07 MED ORDER — IRON SUCROSE 20 MG/ML IV SOLN
200.0000 mg | Freq: Once | INTRAVENOUS | Status: AC
  Administered 2024-02-07: 200 mg via INTRAVENOUS
  Filled 2024-02-07: qty 10

## 2024-02-07 NOTE — Progress Notes (Signed)
 Diagnosis: Iron Deficiency Anemia  Provider:  Chilton Greathouse MD  Procedure: IV Push  IV Type: Peripheral, IV Location: L Antecubital  Venofer (Iron Sucrose), Dose: 200 mg  Post Infusion IV Care: Observation period completed and Peripheral IV Discontinued  Discharge: Condition: Good, Destination: Home . AVS Declined  Performed by:  Rico Ala, LPN

## 2024-02-07 NOTE — Telephone Encounter (Signed)
-----   Message from Burnetta Cart sent at 02/07/2024  7:37 AM EDT ----- Please let patient know that Dr. Lawana Pray would like to keep patient at the mexiletine 250mg  BID ----- Message ----- From: Lei Pump, MD Sent: 02/03/2024   7:46 AM EDT To: Burnetta Cart, PA-C  If he was feeling well on twice daily dosing without PVCs, I think we can continue with that.  Usually 3 times daily dosing is quite difficult for patients and I tried twice daily instead.  Do have any feeling of whether or not he was still symptomatic from his PVCs? ----- Message ----- From: Melida Sprain Sent: 02/02/2024   3:26 PM EDT To: Lei Pump, MD; Lenise Quince, MD  This is a follow-up for PVCs newly started on mexiletine 250 mg twice daily, wife reports some improvement.  She is a Teacher, early years/pre and was inquiring about 3 times daily dosing.  Will defer to you Dr. Lawana Pray to make any medication changes, not sure how much burden he has on interrogation.  Otherwise seems to be doing decently well.

## 2024-02-07 NOTE — Telephone Encounter (Signed)
 Spoke with patient's wife Amalia Badder (OK per [facility]).   Dr. Lawana Pray consulted by Morgan Arab, PA-C regarding mexiletine dosing.   Shared recommendation per Sylvester Evert and Dr. Lawana Pray: ----- Message from Burnetta Cart sent at 02/07/2024  7:37 AM EDT ----- Please let patient know that Dr. Lawana Pray would like to keep patient at the mexiletine 250mg  BID      Amalia Badder verbalized understanding and expressed appreciation for follow-up.

## 2024-02-08 ENCOUNTER — Ambulatory Visit

## 2024-02-14 ENCOUNTER — Other Ambulatory Visit: Payer: Self-pay | Admitting: Radiology

## 2024-02-14 ENCOUNTER — Ambulatory Visit (HOSPITAL_COMMUNITY): Admission: RE | Admit: 2024-02-14 | Source: Home / Self Care | Admitting: Gastroenterology

## 2024-02-14 ENCOUNTER — Encounter: Payer: Self-pay | Admitting: Physician Assistant

## 2024-02-14 ENCOUNTER — Other Ambulatory Visit (HOSPITAL_COMMUNITY): Payer: Self-pay | Admitting: Student

## 2024-02-14 ENCOUNTER — Encounter (HOSPITAL_COMMUNITY): Admission: RE | Payer: Self-pay | Source: Home / Self Care

## 2024-02-14 ENCOUNTER — Ambulatory Visit

## 2024-02-14 VITALS — BP 100/66 | HR 89 | Temp 97.7°F | Resp 18 | Ht 75.0 in | Wt 196.0 lb

## 2024-02-14 DIAGNOSIS — D509 Iron deficiency anemia, unspecified: Secondary | ICD-10-CM

## 2024-02-14 SURGERY — COLONOSCOPY
Anesthesia: Monitor Anesthesia Care

## 2024-02-14 MED ORDER — IRON SUCROSE 20 MG/ML IV SOLN
200.0000 mg | Freq: Once | INTRAVENOUS | Status: AC
  Administered 2024-02-14: 200 mg via INTRAVENOUS
  Filled 2024-02-14: qty 10

## 2024-02-14 NOTE — H&P (Signed)
 Chief Complaint: Patient was seen in consultation today for prostate cancer w/ likely bone metastasis  Procedure: Bone lesion biopsy   Referring Physician(s): [name] E  Supervising Physician: Fernando Hoyer  Patient Status: [facility] - Out-pt  History of Present Illness: Antonio Oliver is a 68 y.o. male with a history of elevated PSA and prostate cancer. CT Angio Chest on 4/22 noted several sclerotic bony lesions. Subsequent PET scan with radiotracer uptake in the right internal iliac lymph node and several sclerotic bone lesions. Patient was referred to IR for possible biopsy of bone lesions. Per Dr. Marlena Sima, recommending left iliac bone biopsy.   Patient presents today with his wife. States that he has not been feeling well recently. Admits to weakness, dizziness, difficulty balancing, and severe bone pain throughout. Has been using fentanyl  patches and percocet for pain relief; however, he feels that this gives him minimal relief. NPO since midnight. ASA held for the last week. All questions and concerns answered at the bedside.  Code Status: Full Code  Past Medical History:  Diagnosis Date   Anxiety and depression    Ascending aorta dilation (HCC) 03/06/2019   4.3 cm 2022   Benign prostatic hyperplasia without lower urinary tract symptoms 11/17/2016   Last Assessment & Plan:  Stable PSA today.  Formatting of this note might be different from the original. Last Assessment & Plan:  Stable PSA today. Last Assessment & Plan:  Formatting of this note might be different from the original. Stable PSA today.   BPH (benign prostatic hyperplasia)    Bradycardia 02/01/2019   Cervical myelopathy (HCC) 11/14/2018   Charcot's joint of foot 03/05/2019   Chronic back pain 04/03/2013   Chronic inflammatory arthritis 11/17/2016   History of positive rheumatoid factor in the past. Denies placement on immunosuppressive therapy   Chronic pain syndrome    Failed back surgical  syndrome.  Chronic neck pain.  Chronic low back pain with sciatica bilateral   Colon cancer screening    2023 cologuard neg   Complete heart block (HCC)    Following AVR s/p St Jude PPM in 03/2017   DM2 (diabetes mellitus, type 2) (HCC) 04/03/2013   Last Assessment & Plan:  Formatting of this note might be different from the original. Diabetes is unchanged.  Continue current treatment regimen. Regular aerobic exercise. Diabetes will be reassessed in 3 months. Will check A1c today. Denies any problems with feet or sensation.   Elevated PSA 11/2021   Enthesopathy of ankle and tarsus 04/04/2013   Last Assessment & Plan:  Continues with swelling after prolonged standing.  Still  Wearing support hose, which helps.   Essential hypertension 10/30/2018   FHx: migraine headaches 04/04/2013   Fracture of capitate bone of wrist 02/26/2019   Fracture of tibia 03/05/2019   GERD (gastroesophageal reflux disease) 10/30/2018   Glaucoma 04/03/2013   Hemangioma of skin and subcutaneous tissue 07/07/2017   History of aortic valve replacement 2018   bicuspid AV   History of kidney stones 11/17/2016   History of sleep apnea    wt loss->gone   Hypothyroidism 10/30/2018   IDA (iron  deficiency anemia) 11/2021   malabs d/t gastric bipass   Lesion of plantar nerve 04/04/2013   Lumbar stenosis with neurogenic claudication 01/08/2021   Major depressive disorder, recurrent (HCC)    ECT in the remote past.  Patient disabled due to recurrent depression and chronic pain.   Migraine syndrome    Mixed dyslipidemia 10/30/2018   Morbid obesity (HCC)  10/27/2011   Nondisplaced fracture of medial cuneiform of left foot, initial encounter for closed fracture 11/16/2018   Osteoarthritis 10/30/2018   Personal history of tobacco use, presenting hazards to health 04/04/2013   Presence of permanent cardiac pacemaker    Severe single current episode of major depressive disorder, without psychotic features (HCC) 11/17/2016    Status post bariatric surgery 04/05/2011   bipass surg --presurg wt 310 lbs   Syncope 02/14/2019   Vitamin D  deficiency 10/30/2018    Past Surgical History:  Procedure Laterality Date   ANTERIOR CERVICAL DECOMP/DISCECTOMY FUSION N/A 11/17/2018   Procedure: CERVICAL THREE-CERVICAL FOUR, CERVICAL FOUR-CERVICAL FIVE, CERVICAL FIVE-CERVICAL SIX ANTERIOR CERVICAL DECOMPRESSION/DISCECTOMY FUSION;  Surgeon: Agustina Aldrich, MD;  Location: [facility] OR;  Service: Neurosurgery;  Laterality: N/A;   AORTIC VALVE REPLACEMENT  03/2017   APPLICATION OF WOUND VAC Left 04/05/2019   Procedure: Application Of Wound Vac;  Surgeon: Donnamarie Gables, MD;  Location: [facility] OR;  Service: Orthopedics;  Laterality: Left;   BACK SURGERY     x4   CARDIOVASCULAR STRESS TEST  05/05/2021   lexiscan  neg   FASCIOTOMY Left 04/05/2019   Left leg 2 compartment fasciotomy   FASCIOTOMY Left 04/05/2019   Procedure: FASCIOTOMY LEFT LOWER LEG;  Surgeon: Donnamarie Gables, MD;  Location: [facility] OR;  Service: Orthopedics;  Laterality: Left;   FOOT NEUROMA SURGERY     GASTRIC BYPASS  2012   HEMATOMA EVACUATION Left 04/05/2019   Procedure: Evacuation Hematoma Left Lower Leg;  Surgeon: Donnamarie Gables, MD;  Location: [facility] OR;  Service: Orthopedics;  Laterality: Left;   HEMORRHOID SURGERY     over 30 years ago   HERNIA REPAIR     LIH umb   I & D EXTREMITY Left 04/13/2022   Procedure: IRRIGATION AND DEBRIDEMENT OF ELBOW AND BURSECTOMY;  Surgeon: Arvil Birks, MD;  Location: [facility] OR;  Service: Orthopedics;  Laterality: Left;   LUMBAR LAMINECTOMY/DECOMPRESSION MICRODISCECTOMY N/A 01/08/2021   Procedure: CENTRAL LUMBAR LAMINECTOMY LUMBAR TWO-THREE, LUMBAR THREE-FOUR;  Surgeon: Alphonso Jean, MD;  Location: [facility] OR;  Service: Orthopedics;  Laterality: N/A;   NASAL SINUS SURGERY     x2   NECK SURGERY     OLECRANON BURSECTOMY  2023   d/t septic bursitis   TRANSESOPHAGEAL ECHOCARDIOGRAM (CATH LAB) N/A 01/02/2024   Procedure: TRANSESOPHAGEAL  ECHOCARDIOGRAM;  Surgeon: Luana Rumple, MD;  Location: [facility] INVASIVE CV LAB;  Service: Cardiovascular;  Laterality: N/A;   TRANSTHORACIC ECHOCARDIOGRAM  12/2020   EF normal, mod MR, ascending AA (4.4 cm)    Allergies: Calcium  carbonate and Ms contin [morphine]  Medications: Prior to Admission medications   Medication Sig Start Date End Date Taking? Authorizing Provider  amoxicillin  (AMOXIL ) 500 MG capsule TAKE 4 CAPSULES BY MOUTH 1 HOUR PRIOR TO DENTAL APPOINTMENT 12/05/23   Lenise Quince, MD  aspirin  EC 81 MG tablet Take 81 mg by mouth daily.    [provider]  atorvastatin  (LIPITOR) 20 MG tablet Take 20 mg by mouth daily at 6 PM.     [provider]  baclofen (LIORESAL) 10 MG tablet Take 10 mg by mouth 3 (three) times daily. 12/07/23   [provider]  buPROPion  (WELLBUTRIN  SR) 200 MG 12 hr tablet Take 200 mg by mouth 2 (two) times daily. 07/20/23   [provider]  busPIRone  (BUSPAR ) 10 MG tablet Take 20 mg by mouth 2 (two) times daily. 09/01/23   [provider]  CALCIUM  CITRATE PO Take 500 mg by mouth  2 (two) times daily.    [provider]  Cholecalciferol  (VITAMIN D -3 PO) Take 5,000 capsules by mouth daily.    [provider]  clobetasol (TEMOVATE) 0.05 % external solution Apply 1 Application topically as needed. 08/06/22   [provider]  clonazePAM  (KLONOPIN ) 0.5 MG tablet Take 0.5 mg by mouth 2 (two) times daily as needed for anxiety.    [provider]  diclofenac Sodium (VOLTAREN) 1 % GEL Apply 1 application  topically as needed. 12/25/20   [provider]  dicyclomine (BENTYL) 20 MG tablet Take 20 mg by mouth 4 (four) times daily as needed. 09/01/23   [provider]  docusate sodium  (COLACE) 100 MG capsule Take 1 capsule (100 mg total) by mouth 2 (two) times daily. 01/13/21   Montey Apa, DO  DULoxetine  (CYMBALTA ) 60 MG capsule Take 60 mg by mouth 2 (two) times daily.      [provider]  fentaNYL  (DURAGESIC ) 25 MCG/HR Place 1 patch onto the skin every 3 (three) days. 02/01/24   Walisiewicz, Kaitlyn E, PA-C  ferrous sulfate  325 (65 FE) MG EC tablet Take 325 mg by mouth daily.    [provider]  finasteride  (PROSCAR ) 5 MG tablet Take 5 mg by mouth daily.    [provider]  fluticasone  (FLONASE ) 50 MCG/ACT nasal spray Place 2 sprays into the nose daily as needed for allergies or rhinitis.    [provider]  folic acid  (FOLVITE ) 1 MG tablet Take 1 mg by mouth daily.    [provider]  furosemide  (LASIX ) 20 MG tablet Take 1 tablet (20 mg total) by mouth as needed (for leg swelling). 07/12/19   Revankar, Micael Adas, MD  Galcanezumab -gnlm (EMGALITY ) 120 MG/ML SOAJ Inject 120 mg into the skin every 28 (twenty-eight) days. 12/26/23   Merriam Abbey, DO  hydrALAZINE  (APRESOLINE ) 10 MG tablet Take 1 tablet BY MOUTH three times daily for systolic bp > 140 or diastolic bp > 90 6/57/84   Crenshaw, Deannie Fabian, MD  insulin  lispro (HUMALOG) 100 UNIT/ML injection as directed Injection as needed    [provider]  levothyroxine  (SYNTHROID ) 125 MCG tablet Take 125 mcg by mouth daily.     [provider]  lidocaine  (LIDODERM ) 5 % as needed. 06/07/22   [provider]  loratadine  (CLARITIN ) 10 MG tablet Take 10 mg by mouth daily as needed for allergies.    [provider]  Magnesium  250 MG TABS 2 tablets (500mg ) Orally Twice a day    [provider]  MAGNESIUM  PO Take 500 mg by mouth in the morning and at bedtime.    [provider]  meclizine (ANTIVERT) 25 MG tablet Take 25 mg by mouth 3 (three) times daily as needed for dizziness. 04/02/22   [provider]  metFORMIN  (GLUCOPHAGE ) 1000 MG tablet Take 1,000 mg by mouth 2 (two) times daily with a meal.    [provider]  mexiletine (MEXITIL) 250 MG capsule Take 1 capsule (250 mg total) by mouth 2 (two) times daily. 01/10/24    Camnitz, Babetta Lesch, MD  mirtazapine  (REMERON ) 30 MG tablet 1 tablet at bedtime Orally Once a day    [provider]  Multiple Vitamins-Minerals (MULTIVITAMIN WITH MINERALS) tablet Take 1 tablet by mouth daily.    [provider]  Multiple Vitamins-Minerals (PRESERVISION/LUTEIN) CAPS Take 1 capsule by mouth at bedtime.    [provider]  mupirocin  ointment (BACTROBAN ) 2 % Apply 1 application  topically 3 (three) times daily as needed (infection). 11/14/20   [provider]  naloxone  (NARCAN ) nasal spray 4 mg/0.1 mL Instill or spray into the nose if you are experiencing shortness of breath or signs of opioid overdose. 01/21/21   Nitka, James E, MD  omeprazole (PRILOSEC) 20 MG capsule Take 20 mg by mouth 2 (two) times daily before a meal.     [provider]  oxyCODONE -acetaminophen  (PERCOCET) 10-325 MG tablet Take 0.5 tablets by mouth See admin instructions. 1/2 tablet every 3 hours.    [provider]  polyethylene glycol (MIRALAX  / GLYCOLAX ) 17 g packet Take 17 g by mouth daily as needed.    [provider]  potassium chloride  (KLOR-CON ) 10 MEQ tablet Take 10 mEq by mouth as needed (with lasix  dose).    [provider]  potassium chloride  SA (KLOR-CON  M) 20 MEQ tablet Take 20 mEq by mouth as needed.    [provider]  pregabalin  (LYRICA ) 100 MG capsule Take 100 mg by mouth 3 (three) times daily. 04/05/22   [provider]  pseudoephedrine (SUDAFED) 30 MG tablet Take 30 mg by mouth every 6 (six) hours as needed.    [provider]  Semaglutide ,0.25 or 0.5MG /DOS, (OZEMPIC , 0.25 OR 0.5 MG/DOSE,) 2 MG/3ML SOPN Inject 0.5 mg into the skin once a week. 12/26/23     silodosin (RAPAFLO) 8 MG CAPS capsule Take 8 mg by mouth daily. 09/22/21   [provider]  solifenacin (VESICARE) 10 MG tablet Take 10 mg by mouth at bedtime.    [provider]  tiZANidine  (ZANAFLEX ) 4 MG tablet Take 4 mg by mouth  3 (three) times daily. 04/05/22   [provider]  traMADol  (ULTRAM ) 50 MG tablet Take 50 mg by mouth 3 (three) times daily.    [provider]  Ubrogepant  (UBRELVY ) 100 MG TABS Take 1 tablet (100 mg total) by mouth as needed (May repeat in 2 hours.  Maximum 2 tablets in 24 hours.). 08/30/23   Merriam Abbey, DO     Family History  Problem Relation Age of Onset   Cancer Mother        breast, likely diagnosed less than 42 years of age.   Rheum arthritis Mother    Diabetes Mother    Heart disease Father    Kidney disease Father    Diabetes Father    Hypertension Brother    Diabetes Brother    Osteoarthritis Brother    Heart disease Brother    Hypertension Brother    Diabetes Brother    Rheum arthritis Brother    Depression Daughter    Anxiety disorder Daughter     Social History   Socioeconomic History   Marital status: Married    Spouse name: susan    Number of children: 1   Years of education: Not on file   Highest education level: Bachelor's degree (e.g., BA, AB, BS)  Occupational History   Not on file  Tobacco Use   Smoking status: Never   Smokeless tobacco: Never  Vaping Use   Vaping status: Never Used  Substance and Sexual Activity   Alcohol use: Not Currently   Drug use: No   Sexual activity: Not on file  Other Topics Concern   Not on file  Social History Narrative   Patient lives at home with spouse Amalia Badder   Educ: BS   Occup: retired Banker.  Disabled due to depression and pain.   Tobacco:  None   Alc: none   Right handed   Social Drivers of Corporate investment banker Strain: Low Risk  (05/30/2019)   Received from [facility], [facility]   Overall Financial Resource Strain (CARDIA)    Difficulty of Paying Living Expenses: Not hard at all  Food Insecurity: No Food Insecurity (05/30/2019)   Received from [facility], [facility]   Hunger Vital Sign    Worried About Running Out of Food in the Last Year: Never  true    Ran Out of Food in the Last Year: Never true  Transportation Needs: Unmet Transportation Needs (05/30/2019)   Received from [facility], [facility]   PRAPARE - Transportation    Lack of Transportation (Medical): Yes    Lack of Transportation (Non-Medical): Yes  Physical Activity: Not on file  Stress: Not on file  Social Connections: Unknown (01/06/2023)   Received from [facility], [facility]   Social Network    Social Network: Not on file    Review of Systems  Musculoskeletal:  Positive for arthralgias (severe throughout).  Neurological:  Positive for dizziness and weakness.  Denies any N/V, chest pain, shortness of breath, fevers/chills. All other ROS negative.  Vital Signs: BP (!) 132/90   Pulse 67   Temp 98 F (36.7 C) (Oral)   Resp 17   SpO2 92%    Physical Exam Vitals reviewed.  Constitutional:      Appearance: Normal appearance.  HENT:     Head: Normocephalic and atraumatic.     Mouth/Throat:     Mouth: Mucous membranes are moist.     Pharynx: Oropharynx is clear.  Cardiovascular:     Rate and Rhythm: Normal rate and regular rhythm.  Pulmonary:     Effort: Pulmonary effort is normal.     Breath sounds: Normal breath sounds.  Abdominal:     General: Abdomen is flat.     Palpations: Abdomen is soft.     Tenderness: There is no abdominal tenderness.  Musculoskeletal:        General: Normal range of motion.     Cervical back: Normal range of motion.  Skin:    General: Skin is warm and dry.  Neurological:     Mental Status: He is alert and oriented to person, place, and time. Mental status is at baseline.  Psychiatric:        Mood and Affect: Mood normal.     Imaging: NM PET (PSMA) SKULL TO MID THIGH Result Date: 01/30/2024 CLINICAL DATA:  High-risk prostate carcinoma. Sclerotic bone lesions. EXAM: NUCLEAR MEDICINE PET SKULL BASE TO THIGH TECHNIQUE: 7.6 mCi Flotufolastat (Posluma ) was injected intravenously. Full-ring PET imaging was  performed from the skull base to thigh after the radiotracer. CT data was obtained and used for attenuation correction and anatomic localization. COMPARISON:  None Available. FINDINGS: NECK No radiotracer activity in neck lymph nodes. Incidental CT finding: None. CHEST No radiotracer accumulation within mediastinal or hilar lymph nodes. No suspicious pulmonary nodules on the CT scan. Incidental CT finding: None. ABDOMEN/PELVIS Prostate: Intense radiotracer accumulation is seen in the anterior and left lateral prostate, with SUV max of 19.9, consistent with primary prostate carcinoma. Lymph nodes: 5 mm right internal iliac lymph node shows intense radiotracer uptake, with SUV max of 14.0, consistent with the metastasis. No other sites of abnormal radiotracer accumulation within pelvic or abdominal nodes. Liver: No evidence of liver metastasis. Incidental CT finding: Prior gastric bypass surgery, with small hiatal hernia. Diffuse colonic  distension by large stool burden noted. Bilateral renal calculi are seen with partial staghorn calculus in the left renal collecting system. SKELETON Numerous sclerotic bone lesions are seen is throughout the axial skeleton and proximal appendicular skeleton which show intense radiotracer accumulation, consistent with diffuse bone metastases. IMPRESSION: Intense radiotracer accumulation in the anterior and left lateral prostate, consistent with primary prostate carcinoma. 5 mm right internal iliac lymph node with intense radiotracer accumulation, consistent with metastatic disease. Diffuse sclerotic bone metastases. Bilateral nephrolithiasis.  No hydronephrosis. Diffuse colonic distension by large stool burden. Recommend clinical correlation for possible constipation. Previous gastric bypass surgery with small hiatal hernia. Electronically Signed   By: Marlyce Sine M.D.   On: 01/30/2024 13:29    Labs:  CBC: Recent Labs    12/27/23 1619 01/19/24 1415  WBC 6.1 6.2  HGB 9.9*  10.2*  HCT 32.6* 33.0*  PLT 242 252    COAGS: No results for input(s): INR, APTT in the last 8760 hours.  BMP: Recent Labs    12/27/23 1253 12/27/23 1619 01/19/24 1415  NA  --  139 140  K  --  4.9 4.7  CL  --  108* 107  CO2  --  21 25  GLUCOSE  --  118* 171*  BUN  --  15 16  CALCIUM   --  9.2 9.3  CREATININE 1.00 0.90 1.02  GFRNONAA  --   --  >60    LIVER FUNCTION TESTS: Recent Labs    01/19/24 1415  BILITOT 0.4  AST 14*  ALT 17  ALKPHOS 92  PROT 6.9  ALBUMIN  4.4    TUMOR MARKERS: No results for input(s): AFPTM, CEA, CA199, CHROMGRNA in the last 8760 hours.  Assessment and Plan:  Prostate Cancer with likely bony metastases: KIER SMEAD is a 68 y.o. male with a history of prostate cancer and newly found sclerotic lesions who presents to [facility] Interventional Radiology department for an image-guided bone lesion biopsy with Dr. Baldemar Lev. Procedure to be performed under moderate sedation.  Risks and benefits of bone lesion biopsy was discussed with the patient and/or patient's family including, but not limited to bleeding, infection, damage to adjacent structures or low yield requiring additional tests.  All of the questions were answered and there is agreement to proceed.  Consent signed and in chart.   Thank you for this interesting consult. I greatly enjoyed meeting MUNIR VICTORIAN and look forward to participating in their care. A copy of this report was sent to the requesting provider on this date.  Electronically Signed: Jazier Mcglamery M Arnetha Silverthorne, PA-C 02/15/2024, 9:40 AM   I spent a total of  30 Minutes in face to face clinical consultation, greater than 50% of which was counseling/coordinating care for bone biopsy.

## 2024-02-14 NOTE — Progress Notes (Signed)
 Diagnosis: Iron Deficiency Anemia  Provider:  Chilton Greathouse MD  Procedure: IV Push  IV Type: Peripheral, IV Location: L Forearm  Venofer (Iron Sucrose), Dose: 200 mg  Post Infusion IV Care: Observation period completed and Peripheral IV Discontinued  Discharge: Condition: Good, Destination: Home . AVS Declined  Performed by:  Garnette Czech, RN

## 2024-02-14 NOTE — Progress Notes (Signed)
 Patient for CT Bone Biopsy on Wed 02/15/24, I called and spoke with the patient's wife, Antonio Oliver on the phone and gave pre-procedure instructions. Antonio Oliver was made aware to have the patient here at 9a, NPO after MN prior to procedure as well as driver post procedure/recovery/discharge. Pt stated understanding. Called 02/14/24

## 2024-02-15 ENCOUNTER — Other Ambulatory Visit: Payer: Self-pay

## 2024-02-15 ENCOUNTER — Encounter: Payer: Self-pay | Admitting: Medical Oncology

## 2024-02-15 ENCOUNTER — Ambulatory Visit
Admission: RE | Admit: 2024-02-15 | Discharge: 2024-02-15 | Disposition: A | Discharge status: Home / Self Care | Source: Ambulatory Visit | Attending: Physician Assistant | Admitting: Physician Assistant

## 2024-02-15 DIAGNOSIS — Z01812 Encounter for preprocedural laboratory examination: Secondary | ICD-10-CM | POA: Diagnosis present

## 2024-02-15 DIAGNOSIS — Z8546 Personal history of malignant neoplasm of prostate: Secondary | ICD-10-CM | POA: Diagnosis not present

## 2024-02-15 DIAGNOSIS — R972 Elevated prostate specific antigen [PSA]: Secondary | ICD-10-CM | POA: Diagnosis not present

## 2024-02-15 DIAGNOSIS — M899 Disorder of bone, unspecified: Secondary | ICD-10-CM | POA: Diagnosis not present

## 2024-02-15 LAB — GLUCOSE, CAPILLARY: Glucose-Capillary: 106 mg/dL — ABNORMAL HIGH (ref 70–99)

## 2024-02-15 MED ORDER — MIDAZOLAM HCL 2 MG/2ML IJ SOLN
INTRAMUSCULAR | Status: AC
  Filled 2024-02-15: qty 2

## 2024-02-15 MED ORDER — SODIUM CHLORIDE 0.9 % IV SOLN: INTRAVENOUS | Status: DC

## 2024-02-15 MED ORDER — MIDAZOLAM HCL 5 MG/5ML IJ SOLN
INTRAMUSCULAR | Status: AC | PRN
  Administered 2024-02-15: 1 mg via INTRAVENOUS

## 2024-02-15 MED ORDER — LIDOCAINE 1 % OPTIME INJ - NO CHARGE
10.0000 mL | Freq: Once | INTRAMUSCULAR | Status: AC
  Administered 2024-02-15: 10 mL
  Filled 2024-02-15: qty 10

## 2024-02-15 MED ORDER — FENTANYL CITRATE (PF) 100 MCG/2ML IJ SOLN
INTRAMUSCULAR | Status: AC
Start: 2024-02-15 — End: 2024-02-15
  Filled 2024-02-15: qty 2

## 2024-02-15 NOTE — Procedures (Signed)
 Interventional Radiology Procedure Note  Procedure: Successful core biopsy of LEFT iliac bone lesion  Complications: None  Estimated Blood Loss: None  Recommendations: - DC home   Signed,  Roxie Cord, MD

## 2024-02-15 NOTE — Progress Notes (Signed)
 [facility]  Patient spouse called to inform me that Mr. Shear had his biopsy today. She expressed concern that it would not be resulted in time for patient's appointment with Dr. Alita Irwin on 6/17.   Review with Dr. Alita Irwin, patients concern and informed patient that per Dr. Alita Irwin, appointment was good, biopsy should be resulted by appointment.   Mrs. Parfait expressed thanks. I encouraged her to call me with further questions concerns.   Esperanza Hedges, RN, BSN, [facility] Oncology Nurse Navigator, [facility] 02/15/2024 2:23 PM

## 2024-02-15 NOTE — Progress Notes (Signed)
 Patient clinically stable post CT Bone lesion biopsy per Dr Marne Sings, tolerated well. Vitals stable pre and post procedure. Received Versed  1 mg  along with Local for procedure, since has fentanyl  patch on since yesterday. Report given to [facility] RN post procedure/specials/18

## 2024-02-17 ENCOUNTER — Other Ambulatory Visit: Payer: Self-pay | Admitting: Physician Assistant

## 2024-02-17 ENCOUNTER — Telehealth: Payer: Self-pay | Admitting: Physician Assistant

## 2024-02-17 ENCOUNTER — Other Ambulatory Visit (HOSPITAL_COMMUNITY): Payer: Self-pay

## 2024-02-17 ENCOUNTER — Other Ambulatory Visit: Payer: Self-pay

## 2024-02-17 DIAGNOSIS — C61 Malignant neoplasm of prostate: Secondary | ICD-10-CM

## 2024-02-17 DIAGNOSIS — G893 Neoplasm related pain (acute) (chronic): Secondary | ICD-10-CM | POA: Insufficient documentation

## 2024-02-17 MED ORDER — FENTANYL 50 MCG/HR TD PT72
1.0000 | MEDICATED_PATCH | TRANSDERMAL | 0 refills | Status: DC
  Filled 2024-02-17: qty 5, 15d supply, fill #0

## 2024-02-17 MED ORDER — BICALUTAMIDE 50 MG PO TABS
50.0000 mg | ORAL_TABLET | Freq: Every day | ORAL | 0 refills | Status: DC
  Filled 2024-02-17: qty 30, 30d supply, fill #0

## 2024-02-17 NOTE — Progress Notes (Unsigned)
 [facility] CONSULT NOTE  Patient Care Team: Angelique Barer, MD as PCP - General (Internal Medicine) Lei Pump, MD as PCP - Electrophysiology (Cardiology) Audery Blazing Deannie Fabian, MD as PCP - Cardiology (Cardiology) Audery Blazing Deannie Fabian, MD as Consulting Physician (Cardiology) Merriam Abbey, DO as Consulting Physician (Neurology) Annella Barrows, RN as Oncology Nurse Navigator (Medical Oncology)  ASSESSMENT & PLAN:  Antonio Oliver is a 68 y.o.male with history of gastric bypass, pacemaker, aortic valve replacement and thoracic aortic aneurysm, HTN, HLD, DM2, Obesity, arthritis being seen at Medical Oncology Clinic for prostate cancer.  Current diagnosis: mHSPC Initial diagnosis: mHSPC with bone and LN metastses Germline testing: will refer Somatic testing: not yet Treatment:   The patient was counseled on the natural history of prostate cancer and the standard treatment options that are available for prostate cancer.   Discussed ARASENS trial. An international, phase 3 trial, we randomly assigned patients with metastatic, hormone-sensitive prostate cancer in a 1:1 ratio to receive darolutamide 600-mg twice daily) or matching placebo, both in combination with androgen-deprivation therapy and docetaxel. Bone metastases were well represented.  The overall survival at 4 years was 62.7% in the darolutamide group and 50.4% in the placebo group. M1 disease was well represented. We do not have data without docetaxel in this study. Time to CRPC was over 4 years with triplet therapy.   We discussed docetaxel chemotherapy.  This can be postpone until palliative radiation completed and see what his PS at the time.  ADT and ARPI can cause fatigue, and rarely rash, heart failure, ischemic heart disease in <5%.  In patients undergoing ADT with prostate cancer, a question of manage cardiovascular risk factors to decrease cardiac events recommended including controlling hypertension,  hyperlipidemia, prevent diabetes and regular exercises are recommended once pain under controlled. After discussion he would like to proceed.  Also discussed palliative care referral for ACP.  Assessment & Plan Hormone sensitive prostate cancer (HCC) Started bicalutamide 50 mg daily Will request darolutamide 600 mg twice daily. Once receives daro will stop bicalutamide. May start relugolix anytime Follow up with palliative radiation tomorrow Other iron  deficiency anemia Previous gastric bypass surgery Received IV iron  Venofer  Will repeat in the future Cancer associated pain Increased fentanyl  to 50 mcg Prn percocet Palliative care follow up Palliative radiation consult tomorrow with Dr. Lorri Rota Increase fiber for constipation. At risk for side effect of medication  Supportive baseline bone mineral density study in the future calcium  (1000-1200 mg daily from food and supplements) and vitamin D3 (1000 IU daily) Healthy lifestyle to prevent diabetes and CV disease Aggressive cardiovascular risk management Limit alcohol consumption and avoid smoking Nausea and vomiting, unspecified vomiting type Prescribed compazine 5 mg every 6 hours as needed  Orders Placed This Encounter  Procedures   CBC with Differential (Cancer Center Only)    Standing Status:   Future    Expiration Date:   02/19/2025   CMP (Cancer Center only)    Standing Status:   Future    Expiration Date:   02/19/2025   Testosterone     Standing Status:   Future    Expiration Date:   02/19/2025   Prostate-Specific AG, Serum    Standing Status:   Future    Expiration Date:   02/19/2025    All questions were answered. The patient knows to call the clinic with any problems, questions or concerns. No barriers to learning was detected.  Lowanda Ruddy, MD 6/16/202510:10 AM  CHIEF COMPLAINTS/PURPOSE OF CONSULTATION:  Prostate cancer  HISTORY OF PRESENTING ILLNESS:  Antonio Oliver 68 y.o. male is here because of  prostate cancer. I have reviewed his chart and materials related to his cancer extensively and collaborated history with the patient. Summary of oncologic history is as follows:  Oncology History  Hormone sensitive prostate cancer (HCC)  12/27/2023 Imaging   He underwent CT chest on 12/30/2023 due to history of thoracic aortic aneurysm. Findings showed sclerotic bony lesions are present. The largest is in the T3 vertebral body measuring 1.7 cm.    01/19/2024 Tumor Marker   PSA 99.7   01/27/2024 PET scan   PSMA PET Intense radiotracer accumulation in the anterior and left lateral prostate, consistent with primary prostate carcinoma.   5 mm right internal iliac lymph node with intense radiotracer accumulation, consistent with metastatic disease.   Diffuse sclerotic bone metastases.   Bilateral nephrolithiasis.  No hydronephrosis.   Diffuse colonic distension by large stool burden. Recommend clinical correlation for possible constipation.   02/15/2024 Pathology Results   Bone, biopsy, Left iliac lesion :       -METASTATIC PROSTATE ADENOCARCINOMA (CK AE1/AE3 AND NKX3.1 POSITIVE, WITH      SATISFACTORY CONTROLS.    02/17/2024 Initial Diagnosis   Hormone sensitive prostate cancer [facility]     MEDICAL HISTORY:  Past Medical History:  Diagnosis Date   Anxiety and depression    Ascending aorta dilation (HCC) 03/06/2019   4.3 cm 2022   Benign prostatic hyperplasia without lower urinary tract symptoms 11/17/2016   Last Assessment & Plan:  Stable PSA today.  Formatting of this note might be different from the original. Last Assessment & Plan:  Stable PSA today. Last Assessment & Plan:  Formatting of this note might be different from the original. Stable PSA today.   BPH (benign prostatic hyperplasia)    Bradycardia 02/01/2019   Cervical myelopathy (HCC) 11/14/2018   Charcot's joint of foot 03/05/2019   Chronic back pain 04/03/2013   Chronic inflammatory arthritis 11/17/2016   History of  positive rheumatoid factor in the past. Denies placement on immunosuppressive therapy   Chronic pain syndrome    Failed back surgical syndrome.  Chronic neck pain.  Chronic low back pain with sciatica bilateral   Colon cancer screening    2023 cologuard neg   Complete heart block (HCC)    Following AVR s/p St Jude PPM in 03/2017   DM2 (diabetes mellitus, type 2) (HCC) 04/03/2013   Last Assessment & Plan:  Formatting of this note might be different from the original. Diabetes is unchanged.  Continue current treatment regimen. Regular aerobic exercise. Diabetes will be reassessed in 3 months. Will check A1c today. Denies any problems with feet or sensation.   Elevated PSA 11/2021   Enthesopathy of ankle and tarsus 04/04/2013   Last Assessment & Plan:  Continues with swelling after prolonged standing.  Still  Wearing support hose, which helps.   Essential hypertension 10/30/2018   FHx: migraine headaches 04/04/2013   Fracture of capitate bone of wrist 02/26/2019   Fracture of tibia 03/05/2019   GERD (gastroesophageal reflux disease) 10/30/2018   Glaucoma 04/03/2013   Hemangioma of skin and subcutaneous tissue 07/07/2017   History of aortic valve replacement 2018   bicuspid AV   History of kidney stones 11/17/2016   History of sleep apnea    wt loss->gone   Hypothyroidism 10/30/2018   IDA (iron  deficiency anemia) 11/2021   malabs d/t gastric bipass   Lesion of plantar nerve 04/04/2013  Lumbar stenosis with neurogenic claudication 01/08/2021   Major depressive disorder, recurrent (HCC)    ECT in the remote past.  Patient disabled due to recurrent depression and chronic pain.   Migraine syndrome    Mixed dyslipidemia 10/30/2018   Morbid obesity (HCC) 10/27/2011   Nondisplaced fracture of medial cuneiform of left foot, initial encounter for closed fracture 11/16/2018   Osteoarthritis 10/30/2018   Personal history of tobacco use, presenting hazards to health 04/04/2013   Presence of  permanent cardiac pacemaker    Severe single current episode of major depressive disorder, without psychotic features (HCC) 11/17/2016   Status post bariatric surgery 04/05/2011   bipass surg --presurg wt 310 lbs   Syncope 02/14/2019   Vitamin D  deficiency 10/30/2018    SURGICAL HISTORY: Past Surgical History:  Procedure Laterality Date   ANTERIOR CERVICAL DECOMP/DISCECTOMY FUSION N/A 11/17/2018   Procedure: CERVICAL THREE-CERVICAL FOUR, CERVICAL FOUR-CERVICAL FIVE, CERVICAL FIVE-CERVICAL SIX ANTERIOR CERVICAL DECOMPRESSION/DISCECTOMY FUSION;  Surgeon: Agustina Aldrich, MD;  Location: [facility] OR;  Service: Neurosurgery;  Laterality: N/A;   AORTIC VALVE REPLACEMENT  03/2017   APPLICATION OF WOUND VAC Left 04/05/2019   Procedure: Application Of Wound Vac;  Surgeon: Donnamarie Gables, MD;  Location: [facility] OR;  Service: Orthopedics;  Laterality: Left;   BACK SURGERY     x4   CARDIOVASCULAR STRESS TEST  05/05/2021   lexiscan  neg   FASCIOTOMY Left 04/05/2019   Left leg 2 compartment fasciotomy   FASCIOTOMY Left 04/05/2019   Procedure: FASCIOTOMY LEFT LOWER LEG;  Surgeon: Donnamarie Gables, MD;  Location: [facility] OR;  Service: Orthopedics;  Laterality: Left;   FOOT NEUROMA SURGERY     GASTRIC BYPASS  2012   HEMATOMA EVACUATION Left 04/05/2019   Procedure: Evacuation Hematoma Left Lower Leg;  Surgeon: Donnamarie Gables, MD;  Location: [facility] OR;  Service: Orthopedics;  Laterality: Left;   HEMORRHOID SURGERY     over 30 years ago   HERNIA REPAIR     LIH umb   I & D EXTREMITY Left 04/13/2022   Procedure: IRRIGATION AND DEBRIDEMENT OF ELBOW AND BURSECTOMY;  Surgeon: Arvil Birks, MD;  Location: [facility] OR;  Service: Orthopedics;  Laterality: Left;   LUMBAR LAMINECTOMY/DECOMPRESSION MICRODISCECTOMY N/A 01/08/2021   Procedure: CENTRAL LUMBAR LAMINECTOMY LUMBAR TWO-THREE, LUMBAR THREE-FOUR;  Surgeon: Alphonso Jean, MD;  Location: [facility] OR;  Service: Orthopedics;  Laterality: N/A;   NASAL SINUS SURGERY     x2    NECK SURGERY     OLECRANON BURSECTOMY  2023   d/t septic bursitis   TRANSESOPHAGEAL ECHOCARDIOGRAM (CATH LAB) N/A 01/02/2024   Procedure: TRANSESOPHAGEAL ECHOCARDIOGRAM;  Surgeon: Luana Rumple, MD;  Location: [facility] INVASIVE CV LAB;  Service: Cardiovascular;  Laterality: N/A;   TRANSTHORACIC ECHOCARDIOGRAM  12/2020   EF normal, mod MR, ascending AA (4.4 cm)    SOCIAL HISTORY: Social History   Socioeconomic History   Marital status: Married    Spouse name: susan    Number of children: 1   Years of education: Not on file   Highest education level: Bachelor's degree (e.g., BA, AB, BS)  Occupational History   Not on file  Tobacco Use   Smoking status: Never   Smokeless tobacco: Never  Vaping Use   Vaping status: Never Used  Substance and Sexual Activity   Alcohol use: Not Currently   Drug use: No   Sexual activity: Not on file  Other Topics Concern   Not on file  Social History Narrative   Patient lives  at home with spouse Amalia Badder   Educ: BS   Occup: retired Banker.  Disabled due to depression and pain.   Tobacco: None   Alc: none   Right handed   Social Drivers of Corporate investment banker Strain: Low Risk  (05/30/2019)   Received from [facility]   Overall Financial Resource Strain (CARDIA)    Difficulty of Paying Living Expenses: Not hard at all  Food Insecurity: No Food Insecurity (05/30/2019)   Received from [facility]   Hunger Vital Sign    Worried About Running Out of Food in the Last Year: Never true    Ran Out of Food in the Last Year: Never true  Transportation Needs: Unmet Transportation Needs (05/30/2019)   Received [facility]    Lack of Transportation (Medical): Yes    Lack of Transportation (Non-Medical): Yes  Physical Activity: Not on file  Stress: Not on file  Social Connections: Unknown (01/06/2023)   Received from [facility]   Social Network    Social Network: Not on file  Intimate Partner  Violence: Unknown (01/06/2023)   Received from [facility]   HITS    Physically Hurt: Not on file    Insult or Talk Down To: Not on file    Threaten Physical Harm: Not on file    Scream or Curse: Not on file    FAMILY HISTORY: Family History  Problem Relation Age of Onset   Cancer Mother        breast, likely diagnosed less than 2 years of age.   Rheum arthritis Mother    Diabetes Mother    Heart disease Father    Kidney disease Father    Diabetes Father    Hypertension Brother    Diabetes Brother    Osteoarthritis Brother    Heart disease Brother    Hypertension Brother    Diabetes Brother    Rheum arthritis Brother    Depression Daughter    Anxiety disorder Daughter     ALLERGIES:  is allergic to calcium  carbonate and ms contin [morphine].  MEDICATIONS:  Current Outpatient Medications  Medication Sig Dispense Refill   darolutamide (NUBEQA) 300 MG tablet Take 2 tablets (600 mg total) by mouth 2 (two) times daily with a meal. 120 tablet 11   prochlorperazine (COMPAZINE) 5 MG tablet Take 1 tablet (5 mg total) by mouth every 6 (six) hours as needed for nausea or vomiting. 30 tablet 0   relugolix (ORGOVYX) 120 MG tablet Take 3 tablets (360 mg total) by mouth on day 1, then take 1 tablet (120 mg total) daily thereafter. 30 tablet 0   amoxicillin  (AMOXIL ) 500 MG capsule TAKE 4 CAPSULES BY MOUTH 1 HOUR PRIOR TO DENTAL APPOINTMENT 4 capsule 0   aspirin  EC 81 MG tablet Take 81 mg by mouth daily.     atorvastatin  (LIPITOR) 20 MG tablet Take 20 mg by mouth daily at 6 PM.      baclofen (LIORESAL) 10 MG tablet Take 10 mg by mouth 3 (three) times daily.     bicalutamide (CASODEX) 50 MG tablet Take 1 tablet (50 mg total) by mouth daily. 30 tablet 0   buPROPion  (WELLBUTRIN  SR) 200 MG 12 hr tablet Take 200 mg by mouth 2 (two) times daily.     busPIRone  (BUSPAR ) 10 MG tablet Take 20 mg by mouth 2 (two) times daily.     CALCIUM  CITRATE PO Take 500 mg by mouth  2 (two) times daily.      Cholecalciferol  (VITAMIN D -3 PO) Take 5,000 capsules by mouth daily.     clobetasol (TEMOVATE) 0.05 % external solution Apply 1 Application topically as needed.     clonazePAM  (KLONOPIN ) 0.5 MG tablet Take 0.5 mg by mouth 2 (two) times daily as needed for anxiety.     diclofenac Sodium (VOLTAREN) 1 % GEL Apply 1 application  topically as needed.     dicyclomine (BENTYL) 20 MG tablet Take 20 mg by mouth 4 (four) times daily as needed.     docusate sodium  (COLACE) 100 MG capsule Take 1 capsule (100 mg total) by mouth 2 (two) times daily. 10 capsule 0   DULoxetine  (CYMBALTA ) 60 MG capsule Take 60 mg by mouth 2 (two) times daily.      fentaNYL  (DURAGESIC ) 50 MCG/HR Place 1 patch onto the skin every 3 (three) days. 5 patch 0   ferrous sulfate  325 (65 FE) MG EC tablet Take 325 mg by mouth daily.     finasteride  (PROSCAR ) 5 MG tablet Take 5 mg by mouth daily.     fluticasone  (FLONASE ) 50 MCG/ACT nasal spray Place 2 sprays into the nose daily as needed for allergies or rhinitis.     folic acid  (FOLVITE ) 1 MG tablet Take 1 mg by mouth daily.     furosemide  (LASIX ) 20 MG tablet Take 1 tablet (20 mg total) by mouth as needed (for leg swelling). 30 tablet 4   Galcanezumab -gnlm (EMGALITY ) 120 MG/ML SOAJ Inject 120 mg into the skin every 28 (twenty-eight) days. 1 mL 5   hydrALAZINE  (APRESOLINE ) 10 MG tablet Take 1 tablet BY MOUTH three times daily for systolic bp > 140 or diastolic bp > 90 811 tablet 3   insulin  lispro (HUMALOG) 100 UNIT/ML injection as directed Injection as needed     iron  sucrose 200 mg in sodium chloride  0.9 % 100 mL Inject 200 mg into the vein once a week.     levothyroxine  (SYNTHROID ) 125 MCG tablet Take 125 mcg by mouth daily.      lidocaine  (LIDODERM ) 5 % as needed.     loratadine  (CLARITIN ) 10 MG tablet Take 10 mg by mouth daily as needed for allergies.     Magnesium  250 MG TABS 2 tablets (500mg ) Orally Twice a day     MAGNESIUM  PO Take 500 mg by mouth in the morning and at bedtime.      meclizine (ANTIVERT) 25 MG tablet Take 25 mg by mouth 3 (three) times daily as needed for dizziness.     metFORMIN  (GLUCOPHAGE ) 1000 MG tablet Take 1,000 mg by mouth 2 (two) times daily with a meal.     mexiletine (MEXITIL) 250 MG capsule Take 1 capsule (250 mg total) by mouth 2 (two) times daily. 60 capsule 3   mirtazapine  (REMERON ) 30 MG tablet 1 tablet at bedtime Orally Once a day     Multiple Vitamins-Minerals (MULTIVITAMIN WITH MINERALS) tablet Take 1 tablet by mouth daily.     Multiple Vitamins-Minerals (PRESERVISION/LUTEIN) CAPS Take 1 capsule by mouth at bedtime.     mupirocin  ointment (BACTROBAN ) 2 % Apply 1 application topically 3 (three) times daily as needed (infection).     naloxone  (NARCAN ) nasal spray 4 mg/0.1 mL Instill or spray into the nose if you are experiencing shortness of breath or signs of opioid overdose. 1 each 0   omeprazole (PRILOSEC) 20 MG capsule Take 20 mg by mouth 2 (two) times daily before a meal.  oxyCODONE -acetaminophen  (PERCOCET) 10-325 MG tablet Take 0.5 tablet by mouth every 3 hours as directed. 30 tablet 0   polyethylene glycol (MIRALAX  / GLYCOLAX ) 17 g packet Take 17 g by mouth daily as needed.     potassium chloride  (KLOR-CON ) 10 MEQ tablet Take 10 mEq by mouth as needed (with lasix  dose).     potassium chloride  SA (KLOR-CON  M) 20 MEQ tablet Take 20 mEq by mouth as needed.     pregabalin  (LYRICA ) 100 MG capsule Take 100 mg by mouth 3 (three) times daily.     pseudoephedrine (SUDAFED) 30 MG tablet Take 30 mg by mouth every 6 (six) hours as needed.     Semaglutide ,0.25 or 0.5MG /DOS, (OZEMPIC , 0.25 OR 0.5 MG/DOSE,) 2 MG/3ML SOPN Inject 0.5 mg into the skin once a week. 3 mL 3   silodosin (RAPAFLO) 8 MG CAPS capsule Take 8 mg by mouth daily.     solifenacin (VESICARE) 10 MG tablet Take 10 mg by mouth at bedtime.     tiZANidine  (ZANAFLEX ) 4 MG tablet Take 4 mg by mouth 3 (three) times daily.     traMADol  (ULTRAM ) 50 MG tablet Take 50 mg by mouth 3 (three)  times daily.     Ubrogepant  (UBRELVY ) 100 MG TABS Take 1 tablet (100 mg total) by mouth as needed (May repeat in 2 hours.  Maximum 2 tablets in 24 hours.). 48 tablet 3   No current facility-administered medications for this visit.    REVIEW OF SYSTEMS:   All relevant systems were reviewed with the patient and are negative.  PHYSICAL EXAMINATION: ECOG PERFORMANCE STATUS: 2 - Symptomatic, <50% confined to bed  Vitals:   02/20/24 0829  BP: 135/85  Pulse: 75  Resp: 18  Temp: 97.8 F (36.6 C)  SpO2: 100%   Filed Weights   02/20/24 0829  Weight: 200 lb 6.4 oz (90.9 kg)    GENERAL: alert, no distress and comfortable. On wheelchair SKIN: skin color is normal, no jaundice. Bruising over arms EYES: sclera clear LUNGS: Effort normal, no respiratory distress.  ABDOMEN: soft, non-tender and no palpable mass NEURO: lower extremity strength 4/5. Equal sensation to light touch  LABORATORY DATA:  I have reviewed the results of PSA.  RADIOGRAPHIC STUDIES: I have personally reviewed the radiological images as listed and agreed with the findings in the report.

## 2024-02-17 NOTE — Assessment & Plan Note (Addendum)
 Increased fentanyl  to 50 mcg Prn percocet Palliative care follow up Palliative radiation consult tomorrow with Dr. Lorri Rota Increase fiber for constipation.

## 2024-02-17 NOTE — Assessment & Plan Note (Addendum)
 Previous gastric bypass surgery Received IV iron  Venofer  Will repeat in the future

## 2024-02-17 NOTE — Assessment & Plan Note (Addendum)
 Started bicalutamide 50 mg daily Will request darolutamide 600 mg twice daily. Once receives daro will stop bicalutamide. May start relugolix anytime Follow up with palliative radiation tomorrow

## 2024-02-17 NOTE — Telephone Encounter (Signed)
 I spoke to the patient's spouse regarding the bone biopsy that resulted today confirming metastatic prostate cancer.  Patient is scheduled to follow-up with Dr. Alita Irwin early next week to finalize treatment recommendations.  Patient is currently experiencing worsening bone pain in the low back and rib area likely secondary to bone metastasis.  He is currently taking fentanyl  25 mcg every 3 days but his wife did share that she applied the 12.5 mcg patch as well yesterday as the pain was severe.  In addition, he alternates tramadol  and Norco every 3 hours.  I discussed patient's ongoing pain with Dr. Alita Irwin who recommends increasing his fentanyl  dose to 50 mcg and making an urgent referral to palliative radiation.  In addition he recommends initiating treatment with bicalutamide 50 mg daily.   I sent both prescriptions to the [facility].  I advise the patient spouse to monitor his pain closely and present to the emergency room if pain is unmanageable with pain medication. I will make a referral to palliative care as well to further help with pain management moving forward.  Mrs. Mcclimans expressed understanding and satisfaction with the plan provided.

## 2024-02-18 ENCOUNTER — Other Ambulatory Visit: Payer: Self-pay

## 2024-02-18 ENCOUNTER — Other Ambulatory Visit (HOSPITAL_COMMUNITY): Payer: Self-pay

## 2024-02-20 ENCOUNTER — Inpatient Hospital Stay

## 2024-02-20 ENCOUNTER — Telehealth: Payer: Self-pay | Admitting: Pharmacy Technician

## 2024-02-20 ENCOUNTER — Telehealth: Payer: Self-pay

## 2024-02-20 ENCOUNTER — Encounter: Payer: Self-pay | Admitting: Physician Assistant

## 2024-02-20 ENCOUNTER — Other Ambulatory Visit (HOSPITAL_COMMUNITY): Payer: Self-pay

## 2024-02-20 ENCOUNTER — Other Ambulatory Visit: Payer: Self-pay

## 2024-02-20 VITALS — BP 135/85 | HR 75 | Temp 97.8°F | Resp 18 | Ht 75.0 in | Wt 200.4 lb

## 2024-02-20 DIAGNOSIS — D508 Other iron deficiency anemias: Secondary | ICD-10-CM

## 2024-02-20 DIAGNOSIS — I700 Atherosclerosis of aorta: Secondary | ICD-10-CM | POA: Insufficient documentation

## 2024-02-20 DIAGNOSIS — Z5982 Transportation insecurity: Secondary | ICD-10-CM | POA: Insufficient documentation

## 2024-02-20 DIAGNOSIS — Z8379 Family history of other diseases of the digestive system: Secondary | ICD-10-CM | POA: Insufficient documentation

## 2024-02-20 DIAGNOSIS — E782 Mixed hyperlipidemia: Secondary | ICD-10-CM | POA: Insufficient documentation

## 2024-02-20 DIAGNOSIS — N200 Calculus of kidney: Secondary | ICD-10-CM | POA: Diagnosis not present

## 2024-02-20 DIAGNOSIS — Z8349 Family history of other endocrine, nutritional and metabolic diseases: Secondary | ICD-10-CM | POA: Insufficient documentation

## 2024-02-20 DIAGNOSIS — G893 Neoplasm related pain (acute) (chronic): Secondary | ICD-10-CM | POA: Diagnosis not present

## 2024-02-20 DIAGNOSIS — Z86018 Personal history of other benign neoplasm: Secondary | ICD-10-CM | POA: Diagnosis not present

## 2024-02-20 DIAGNOSIS — E669 Obesity, unspecified: Secondary | ICD-10-CM | POA: Insufficient documentation

## 2024-02-20 DIAGNOSIS — I10 Essential (primary) hypertension: Secondary | ICD-10-CM | POA: Diagnosis not present

## 2024-02-20 DIAGNOSIS — Z841 Family history of disorders of kidney and ureter: Secondary | ICD-10-CM | POA: Insufficient documentation

## 2024-02-20 DIAGNOSIS — Z885 Allergy status to narcotic agent status: Secondary | ICD-10-CM | POA: Insufficient documentation

## 2024-02-20 DIAGNOSIS — Z9189 Other specified personal risk factors, not elsewhere classified: Secondary | ICD-10-CM | POA: Diagnosis not present

## 2024-02-20 DIAGNOSIS — Z191 Hormone sensitive malignancy status: Secondary | ICD-10-CM

## 2024-02-20 DIAGNOSIS — Z8679 Personal history of other diseases of the circulatory system: Secondary | ICD-10-CM | POA: Diagnosis not present

## 2024-02-20 DIAGNOSIS — Z803 Family history of malignant neoplasm of breast: Secondary | ICD-10-CM | POA: Diagnosis not present

## 2024-02-20 DIAGNOSIS — Z79899 Other long term (current) drug therapy: Secondary | ICD-10-CM | POA: Insufficient documentation

## 2024-02-20 DIAGNOSIS — Z923 Personal history of irradiation: Secondary | ICD-10-CM | POA: Diagnosis not present

## 2024-02-20 DIAGNOSIS — C61 Malignant neoplasm of prostate: Secondary | ICD-10-CM | POA: Insufficient documentation

## 2024-02-20 DIAGNOSIS — Z8249 Family history of ischemic heart disease and other diseases of the circulatory system: Secondary | ICD-10-CM | POA: Insufficient documentation

## 2024-02-20 DIAGNOSIS — Z510 Encounter for antineoplastic radiation therapy: Secondary | ICD-10-CM | POA: Insufficient documentation

## 2024-02-20 DIAGNOSIS — K5900 Constipation, unspecified: Secondary | ICD-10-CM | POA: Insufficient documentation

## 2024-02-20 DIAGNOSIS — M1990 Unspecified osteoarthritis, unspecified site: Secondary | ICD-10-CM | POA: Diagnosis not present

## 2024-02-20 DIAGNOSIS — C7951 Secondary malignant neoplasm of bone: Secondary | ICD-10-CM | POA: Insufficient documentation

## 2024-02-20 DIAGNOSIS — N433 Hydrocele, unspecified: Secondary | ICD-10-CM | POA: Insufficient documentation

## 2024-02-20 DIAGNOSIS — K6389 Other specified diseases of intestine: Secondary | ICD-10-CM | POA: Insufficient documentation

## 2024-02-20 DIAGNOSIS — Z8719 Personal history of other diseases of the digestive system: Secondary | ICD-10-CM | POA: Diagnosis not present

## 2024-02-20 DIAGNOSIS — Z833 Family history of diabetes mellitus: Secondary | ICD-10-CM | POA: Insufficient documentation

## 2024-02-20 DIAGNOSIS — R112 Nausea with vomiting, unspecified: Secondary | ICD-10-CM

## 2024-02-20 DIAGNOSIS — Z8261 Family history of arthritis: Secondary | ICD-10-CM | POA: Insufficient documentation

## 2024-02-20 DIAGNOSIS — Z9884 Bariatric surgery status: Secondary | ICD-10-CM | POA: Insufficient documentation

## 2024-02-20 DIAGNOSIS — Z818 Family history of other mental and behavioral disorders: Secondary | ICD-10-CM | POA: Insufficient documentation

## 2024-02-20 DIAGNOSIS — G901 Familial dysautonomia [Riley-Day]: Secondary | ICD-10-CM | POA: Insufficient documentation

## 2024-02-20 DIAGNOSIS — N132 Hydronephrosis with renal and ureteral calculous obstruction: Secondary | ICD-10-CM | POA: Insufficient documentation

## 2024-02-20 DIAGNOSIS — E119 Type 2 diabetes mellitus without complications: Secondary | ICD-10-CM | POA: Insufficient documentation

## 2024-02-20 DIAGNOSIS — E785 Hyperlipidemia, unspecified: Secondary | ICD-10-CM | POA: Diagnosis not present

## 2024-02-20 MED ORDER — DAROLUTAMIDE 300 MG PO TABS
600.0000 mg | ORAL_TABLET | Freq: Two times a day (BID) | ORAL | 11 refills | Status: DC
  Filled 2024-02-21: qty 120, 30d supply, fill #0

## 2024-02-20 MED ORDER — OXYCODONE-ACETAMINOPHEN 10-325 MG PO TABS
0.5000 | ORAL_TABLET | ORAL | 0 refills | Status: DC
  Filled 2024-02-20: qty 30, 5d supply, fill #0

## 2024-02-20 MED ORDER — RELUGOLIX 120 MG PO TABS
ORAL_TABLET | ORAL | 0 refills | Status: DC
  Filled 2024-02-21: qty 30, 28d supply, fill #0

## 2024-02-20 MED ORDER — PROCHLORPERAZINE MALEATE 5 MG PO TABS
5.0000 mg | ORAL_TABLET | Freq: Four times a day (QID) | ORAL | 0 refills | Status: DC | PRN
  Filled 2024-02-20: qty 30, 8d supply, fill #0

## 2024-02-20 NOTE — Telephone Encounter (Signed)
 Oral Oncology Pharmacist Encounter  Received new prescription for Nubeqa (darolutamide) and Orgovyx (relugolix) for the treatment of metastatic hormone sensitive prostate cancer in combination with docetaxel, planned duration until disease progression or unacceptable toxicity. Patient will start docetaxel after they receive radiation for the current pain and determination of when patient could start based on recovery.   Labs from 01/19/2024 (CBC, CMP) assessed, no interventions needed. Prescription dose and frequency assessed for appropriateness.   Current medication list in Epic reviewed, DDIs with Nubeqa identified: - atorvastatin  (cat C): Nubeqa may increase serum concentration of OATP1B1/1B3 substrates. Patient will need to monitor for any increased toxicities with atorvastatin .   - Ubrogepant  (ubrelvy ) cat D: Nubeqa may increase or decrease the concentration of Ubrelvy . It is recommended to consider an alternative regimen as the most appropriate dose of Ubrelvy  with Nubeqa is unknown. Will discuss with MD.  Current medication list in Epic reviewed, no significant/ relevant DDIs with Orgovyx identified.   Evaluated chart and no patient barriers to medication adherence noted.   Patient agreement for treatment documented in MD note on 02/20/2024.  Prescription has been e-scribed to the [facility] for benefits analysis and approval.  Oral Oncology Clinic will continue to follow for insurance authorization, copayment issues, initial counseling and start date.  Shayann Garbutt, PharmD Hematology/Oncology Clinical Pharmacist [facility] [phone] 02/20/2024 9:18 AM

## 2024-02-20 NOTE — Progress Notes (Incomplete)
  Radiation Oncology         [phone] ________________________________  Name: Antonio Oliver MRN: [id number]  Date: 02/21/2024  DOB: 02-02-1956  SIMULATION AND TREATMENT PLANNING NOTE    ICD-10-CM   1. Metastasis to bone [facility])  C79.51       DIAGNOSIS:  68 y.o. patient with painful rib, lumbar and thoracic spine metastases  NARRATIVE:  The patient was brought to the CT Simulation planning suite.  Identity was confirmed.  All relevant records and images related to the planned course of therapy were reviewed.  The patient freely provided informed written consent to proceed with treatment after reviewing the details related to the planned course of therapy. The consent form was witnessed and verified by the simulation staff.  Then, the patient was set-up in a stable reproducible  supine position for radiation therapy.  CT images were obtained.  Surface markings were placed.  The CT images were loaded into the planning software.  Then the target and avoidance structures were contoured including kidneys.  Treatment planning then occurred.  The radiation prescription was entered and confirmed.  Then, I designed and supervised the construction of multiple medically necessary complex treatment devices.  I have requested : 3D Simulation  I have requested a DVH of the following structures: Left Kidney, Right Kidney and target.  PLAN:  The patient will receive 30 Gy in 10 fractions to the painful right rib, lumbar and thoracic spine metastases  ________________________________  Trilby Fujisawa. Lorri Rota, M.D.

## 2024-02-20 NOTE — Progress Notes (Signed)
 Radiation Oncology         [phone] ________________________________  Initial outpatient Consultation - Conducted via MyChart due to current COVID-19 concerns for limiting patient exposure  Name: Antonio Oliver MRN: [id number]  Date of Service: 02/21/2024 DOB: Jan 13, 1956  CC:Antonio Oliver, Antonio Chris, Antonio Oliver  Antonio Ruddy, Antonio Oliver   REFERRING PHYSICIAN: Lowanda Ruddy, Antonio Oliver  DIAGNOSIS: 68 y.o. man with painful *** secondary to metastatic prostate cancer  No diagnosis found.  HISTORY OF PRESENT ILLNESS: Antonio Oliver is a 68 y.o. male seen at the request of Dr. Alita Irwin. He has a history of back pain and is followed by neurosurgery for spasmodic torticollis, for which he has received Botox injections. He is also followed by cardiology for history of aortic valve replacement and thoracic aortic aneurysm. He recently underwent surveillance angio chest CT on 12/27/23 for the aneurysm, and this incidentally revealed new sclerotic bony lesions. The largest lesion was in the T3 vertebral body and measured 1.7 cm. The patient was subsequently referred to medical oncology on 01/19/24 for further evaluation and work up. Lab work obtained that day showed an elevated PSA of 99.7 and slightly elevated light chain levels. Given the patient is on finasteride , his PSA would actually be 199.4. {Of note, he has been on finasteride  since at least 2023. His PSA in 11/2021 was elevated at 4.94 (9.88 adjusted for finasteride ). He was offered referral to urology at that time but did not follow up. }  Given the elevated PSA level, he underwent staging PSMA PET scan on 01/27/24 showing: intense radiotracer accumulation in anterior and left lateral prostate, as well as in a 5 mm right internal iliac lymph node; diffuse sclerotic bone metastases. He completed a bone biopsy of a left iliac lesion on 02/15/24. Pathology from the lesion confirmed metastatic prostate adenocarcinoma. He was started on bicalutamide on 02/17/24 and on darolutamide  and  relugolix yesterday, 02/20/24, by Dr. Alita Irwin.  PREVIOUS RADIATION THERAPY: No  PAST MEDICAL HISTORY:  Past Medical History:  Diagnosis Date   Anxiety and depression    Ascending aorta dilation (HCC) 03/06/2019   4.3 cm 2022   Benign prostatic hyperplasia without lower urinary tract symptoms 11/17/2016   Last Assessment & Plan:  Stable PSA today.  Formatting of this note might be different from the original. Last Assessment & Plan:  Stable PSA today. Last Assessment & Plan:  Formatting of this note might be different from the original. Stable PSA today.   BPH (benign prostatic hyperplasia)    Bradycardia 02/01/2019   Cervical myelopathy (HCC) 11/14/2018   Charcot's joint of foot 03/05/2019   Chronic back pain 04/03/2013   Chronic inflammatory arthritis 11/17/2016   History of positive rheumatoid factor in the past. Denies placement on immunosuppressive therapy   Chronic pain syndrome    Failed back surgical syndrome.  Chronic neck pain.  Chronic low back pain with sciatica bilateral   Colon cancer screening    2023 cologuard neg   Complete heart block (HCC)    Following AVR s/p St Jude PPM in 03/2017   DM2 (diabetes mellitus, type 2) (HCC) 04/03/2013   Last Assessment & Plan:  Formatting of this note might be different from the original. Diabetes is unchanged.  Continue current treatment regimen. Regular aerobic exercise. Diabetes will be reassessed in 3 months. Will check A1c today. Denies any problems with feet or sensation.   Elevated PSA 11/2021   Enthesopathy of ankle and tarsus 04/04/2013   Last Assessment & Plan:  Continues with swelling after prolonged standing.  Still  Wearing support hose, which helps.   Essential hypertension 10/30/2018   FHx: migraine headaches 04/04/2013   Fracture of capitate bone of wrist 02/26/2019   Fracture of tibia 03/05/2019   GERD (gastroesophageal reflux disease) 10/30/2018   Glaucoma 04/03/2013   Hemangioma of skin and subcutaneous tissue  07/07/2017   History of aortic valve replacement 2018   bicuspid AV   History of kidney stones 11/17/2016   History of sleep apnea    wt loss->gone   Hypothyroidism 10/30/2018   IDA (iron  deficiency anemia) 11/2021   malabs d/t gastric bipass   Lesion of plantar nerve 04/04/2013   Lumbar stenosis with neurogenic claudication 01/08/2021   Major depressive disorder, recurrent (HCC)    ECT in the remote past.  Patient disabled due to recurrent depression and chronic pain.   Migraine syndrome    Mixed dyslipidemia 10/30/2018   Morbid obesity (HCC) 10/27/2011   Nondisplaced fracture of medial cuneiform of left foot, initial encounter for closed fracture 11/16/2018   Osteoarthritis 10/30/2018   Personal history of tobacco use, presenting hazards to health 04/04/2013   Presence of permanent cardiac pacemaker    Severe single current episode of major depressive disorder, without psychotic features (HCC) 11/17/2016   Status post bariatric surgery 04/05/2011   bipass surg --presurg wt 310 lbs   Syncope 02/14/2019   Vitamin D  deficiency 10/30/2018      PAST SURGICAL HISTORY: Past Surgical History:  Procedure Laterality Date   ANTERIOR CERVICAL DECOMP/DISCECTOMY FUSION N/A 11/17/2018   Procedure: CERVICAL THREE-CERVICAL FOUR, CERVICAL FOUR-CERVICAL FIVE, CERVICAL FIVE-CERVICAL SIX ANTERIOR CERVICAL DECOMPRESSION/DISCECTOMY FUSION;  Surgeon: Agustina Aldrich, Antonio Oliver;  Location: [facility] OR;  Service: Neurosurgery;  Laterality: N/A;   AORTIC VALVE REPLACEMENT  03/2017   APPLICATION OF WOUND VAC Left 04/05/2019   Procedure: Application Of Wound Vac;  Surgeon: Donnamarie Gables, Antonio Oliver;  Location: [facility] OR;  Service: Orthopedics;  Laterality: Left;   BACK SURGERY     x4   CARDIOVASCULAR STRESS TEST  05/05/2021   lexiscan  neg   FASCIOTOMY Left 04/05/2019   Left leg 2 compartment fasciotomy   FASCIOTOMY Left 04/05/2019   Procedure: FASCIOTOMY LEFT LOWER LEG;  Surgeon: Donnamarie Gables, Antonio Oliver;  Location: [facility] OR;   Service: Orthopedics;  Laterality: Left;   FOOT NEUROMA SURGERY     GASTRIC BYPASS  2012   HEMATOMA EVACUATION Left 04/05/2019   Procedure: Evacuation Hematoma Left Lower Leg;  Surgeon: Donnamarie Gables, Antonio Oliver;  Location: [facility] OR;  Service: Orthopedics;  Laterality: Left;   HEMORRHOID SURGERY     over 30 years ago   HERNIA REPAIR     LIH umb   I & D EXTREMITY Left 04/13/2022   Procedure: IRRIGATION AND DEBRIDEMENT OF ELBOW AND BURSECTOMY;  Surgeon: Arvil Birks, Antonio Oliver;  Location: [facility] OR;  Service: Orthopedics;  Laterality: Left;   LUMBAR LAMINECTOMY/DECOMPRESSION MICRODISCECTOMY N/A 01/08/2021   Procedure: CENTRAL LUMBAR LAMINECTOMY LUMBAR TWO-THREE, LUMBAR THREE-FOUR;  Surgeon: Alphonso Jean, Antonio Oliver;  Location: [facility] OR;  Service: Orthopedics;  Laterality: N/A;   NASAL SINUS SURGERY     x2   NECK SURGERY     OLECRANON BURSECTOMY  2023   d/t septic bursitis   TRANSESOPHAGEAL ECHOCARDIOGRAM (CATH LAB) N/A 01/02/2024   Procedure: TRANSESOPHAGEAL ECHOCARDIOGRAM;  Surgeon: Luana Rumple, Antonio Oliver;  Location: [facility] INVASIVE CV LAB;  Service: Cardiovascular;  Laterality: N/A;   TRANSTHORACIC ECHOCARDIOGRAM  12/2020   EF normal, mod MR, ascending AA (4.4  cm)    FAMILY HISTORY:  Family History  Problem Relation Age of Onset   Cancer Mother        breast, likely diagnosed less than 56 years of age.   Rheum arthritis Mother    Diabetes Mother    Heart disease Father    Kidney disease Father    Diabetes Father    Hypertension Brother    Diabetes Brother    Osteoarthritis Brother    Heart disease Brother    Hypertension Brother    Diabetes Brother    Rheum arthritis Brother    Depression Daughter    Anxiety disorder Daughter     SOCIAL HISTORY:  Social History   Socioeconomic History   Marital status: Married    Spouse name: susan    Number of children: 1   Years of education: Not on file   Highest education level: Bachelor's degree (e.g., BA, AB, BS)  Occupational History   Not on file   Tobacco Use   Smoking status: Never   Smokeless tobacco: Never  Vaping Use   Vaping status: Never Used  Substance and Sexual Activity   Alcohol use: Not Currently   Drug use: No   Sexual activity: Not on file  Other Topics Concern   Not on file  Social History Narrative   Patient lives at home with spouse Amalia Badder   Educ: BS   Occup: retired Banker.  Disabled due to depression and pain.   Tobacco: None   Alc: none   Right handed   Social Drivers of Corporate investment banker Strain: Low Risk  (05/30/2019)   Received from [facility]   Overall Financial Resource Strain (CARDIA)    Difficulty of Paying Living Expenses: Not hard at all  Food Insecurity: No Food Insecurity (05/30/2019)   Received from [facility]   Hunger Vital Sign    Worried About Running Out of Food in the Last Year: Never true    Ran Out of Food in the Last Year: Never true  Transportation Needs: Unmet Transportation Needs (05/30/2019)   Received from [facility] - Transportation    Lack of Transportation (Medical): Yes    Lack of Transportation (Non-Medical): Yes  Physical Activity: Not on file  Stress: Not on file  Social Connections: Unknown (01/06/2023)   [facility] from [facility]   Social Network    Social Network: Not on file  Intimate Partner Violence: Unknown (01/06/2023)   Received from [facility]   HITS    Physically Hurt: Not on file    Insult or Talk Down To: Not on file    Threaten Physical Harm: Not on file    Scream or Curse: Not on file    ALLERGIES: Calcium  carbonate and Ms contin [morphine]  MEDICATIONS:  Current Outpatient Medications  Medication Sig Dispense Refill   amoxicillin  (AMOXIL ) 500 MG capsule TAKE 4 CAPSULES BY MOUTH 1 HOUR PRIOR TO DENTAL APPOINTMENT 4 capsule 0   aspirin  EC 81 MG tablet Take 81 mg by mouth daily.     atorvastatin  (LIPITOR) 20 MG tablet Take 20 mg by mouth daily at 6 PM.      baclofen (LIORESAL) 10 MG tablet Take  10 mg by mouth 3 (three) times daily.     bicalutamide (CASODEX) 50 MG tablet Take 1 tablet (50 mg total) by mouth daily. 30 tablet 0   buPROPion  (WELLBUTRIN  SR) 200 MG 12 hr tablet Take 200 mg by  mouth 2 (two) times daily.     busPIRone  (BUSPAR ) 10 MG tablet Take 20 mg by mouth 2 (two) times daily.     CALCIUM  CITRATE PO Take 500 mg by mouth 2 (two) times daily.     Cholecalciferol  (VITAMIN D -3 PO) Take 5,000 capsules by mouth daily.     clobetasol (TEMOVATE) 0.05 % external solution Apply 1 Application topically as needed.     clonazePAM  (KLONOPIN ) 0.5 MG tablet Take 0.5 mg by mouth 2 (two) times daily as needed for anxiety.     darolutamide (NUBEQA) 300 MG tablet Take 2 tablets (600 mg total) by mouth 2 (two) times daily with a meal. 120 tablet 11   diclofenac Sodium (VOLTAREN) 1 % GEL Apply 1 application  topically as needed.     dicyclomine (BENTYL) 20 MG tablet Take 20 mg by mouth 4 (four) times daily as needed.     docusate sodium  (COLACE) 100 MG capsule Take 1 capsule (100 mg total) by mouth 2 (two) times daily. 10 capsule 0   DULoxetine  (CYMBALTA ) 60 MG capsule Take 60 mg by mouth 2 (two) times daily.      fentaNYL  (DURAGESIC ) 50 MCG/HR Place 1 patch onto the skin every 3 (three) days. 5 patch 0   ferrous sulfate  325 (65 FE) MG EC tablet Take 325 mg by mouth daily.     finasteride  (PROSCAR ) 5 MG tablet Take 5 mg by mouth daily.     fluticasone  (FLONASE ) 50 MCG/ACT nasal spray Place 2 sprays into the nose daily as needed for allergies or rhinitis.     folic acid  (FOLVITE ) 1 MG tablet Take 1 mg by mouth daily.     furosemide  (LASIX ) 20 MG tablet Take 1 tablet (20 mg total) by mouth as needed (for leg swelling). 30 tablet 4   Galcanezumab -gnlm (EMGALITY ) 120 MG/ML SOAJ Inject 120 mg into the skin every 28 (twenty-eight) days. 1 mL 5   hydrALAZINE  (APRESOLINE ) 10 MG tablet Take 1 tablet BY MOUTH three times daily for systolic bp > 140 or diastolic bp > 90 161 tablet 3   insulin  lispro  (HUMALOG) 100 UNIT/ML injection as directed Injection as needed     iron  sucrose 200 mg in sodium chloride  0.9 % 100 mL Inject 200 mg into the vein once a week.     levothyroxine  (SYNTHROID ) 125 MCG tablet Take 125 mcg by mouth daily.      lidocaine  (LIDODERM ) 5 % as needed.     loratadine  (CLARITIN ) 10 MG tablet Take 10 mg by mouth daily as needed for allergies.     Magnesium  250 MG TABS 2 tablets (500mg ) Orally Twice a day     MAGNESIUM  PO Take 500 mg by mouth in the morning and at bedtime.     meclizine (ANTIVERT) 25 MG tablet Take 25 mg by mouth 3 (three) times daily as needed for dizziness.     metFORMIN  (GLUCOPHAGE ) 1000 MG tablet Take 1,000 mg by mouth 2 (two) times daily with a meal.     mexiletine (MEXITIL) 250 MG capsule Take 1 capsule (250 mg total) by mouth 2 (two) times daily. 60 capsule 3   mirtazapine  (REMERON ) 30 MG tablet 1 tablet at bedtime Orally Once a day     Multiple Vitamins-Minerals (MULTIVITAMIN WITH MINERALS) tablet Take 1 tablet by mouth daily.     Multiple Vitamins-Minerals (PRESERVISION/LUTEIN) CAPS Take 1 capsule by mouth at bedtime.     mupirocin  ointment (BACTROBAN ) 2 % Apply 1 application topically  3 (three) times daily as needed (infection).     naloxone  (NARCAN ) nasal spray 4 mg/0.1 mL Instill or spray into the nose if you are experiencing shortness of breath or signs of opioid overdose. 1 each 0   omeprazole (PRILOSEC) 20 MG capsule Take 20 mg by mouth 2 (two) times daily before a meal.      oxyCODONE -acetaminophen  (PERCOCET) 10-325 MG tablet Take 0.5 tablet by mouth every 3 hours as directed. 30 tablet 0   polyethylene glycol (MIRALAX  / GLYCOLAX ) 17 g packet Take 17 g by mouth daily as needed.     potassium chloride  (KLOR-CON ) 10 MEQ tablet Take 10 mEq by mouth as needed (with lasix  dose).     potassium chloride  SA (KLOR-CON  M) 20 MEQ tablet Take 20 mEq by mouth as needed.     pregabalin  (LYRICA ) 100 MG capsule Take 100 mg by mouth 3 (three) times daily.      prochlorperazine (COMPAZINE) 5 MG tablet Take 1 tablet (5 mg total) by mouth every 6 (six) hours as needed for nausea or vomiting. 30 tablet 0   pseudoephedrine (SUDAFED) 30 MG tablet Take 30 mg by mouth every 6 (six) hours as needed.     relugolix (ORGOVYX) 120 MG tablet Take 3 tablets (360 mg total) by mouth on day 1, then take 1 tablet (120 mg total) daily thereafter. 30 tablet 0   Semaglutide ,0.25 or 0.5MG /DOS, (OZEMPIC , 0.25 OR 0.5 MG/DOSE,) 2 MG/3ML SOPN Inject 0.5 mg into the skin once a week. 3 mL 3   silodosin (RAPAFLO) 8 MG CAPS capsule Take 8 mg by mouth daily.     solifenacin (VESICARE) 10 MG tablet Take 10 mg by mouth at bedtime.     tiZANidine  (ZANAFLEX ) 4 MG tablet Take 4 mg by mouth 3 (three) times daily.     traMADol  (ULTRAM ) 50 MG tablet Take 50 mg by mouth 3 (three) times daily.     Ubrogepant  (UBRELVY ) 100 MG TABS Take 1 tablet (100 mg total) by mouth as needed (May repeat in 2 hours.  Maximum 2 tablets in 24 hours.). 48 tablet 3   No current facility-administered medications for this encounter.    REVIEW OF SYSTEMS:  On review of systems, the patient reports that he is doing well overall. He denies any chest pain, shortness of breath, cough, fevers, chills, night sweats, unintended weight changes. He denies any bowel or bladder disturbances, and denies abdominal pain, nausea or vomiting. He denies any new musculoskeletal or joint aches or pains.*** A complete review of systems is obtained and is otherwise negative.    PHYSICAL EXAM:  Wt Readings from Last 3 Encounters:  02/20/24 200 lb 6.4 oz (90.9 kg)  02/14/24 196 lb (88.9 kg)  02/07/24 196 lb (88.9 kg)   Temp Readings from Last 3 Encounters:  02/20/24 97.8 F (36.6 C) (Temporal)  02/15/24 98 F (36.7 C) (Oral)  02/14/24 97.7 F (36.5 C) (Oral)   BP Readings from Last 3 Encounters:  02/20/24 135/85  02/15/24 (!) 94/51  02/14/24 100/66   Pulse Readings from Last 3 Encounters:  02/20/24 75  02/15/24 72   02/14/24 89    /10  In general this is a well appearing *** man in no acute distress. He's alert and oriented x4 and appropriate throughout the examination. Cardiopulmonary assessment is negative for acute distress and he exhibits normal effort.     KPS = ***  100 - Normal; no complaints; no evidence of disease. 90   -  Able to carry on normal activity; minor signs or symptoms of disease. 80   - Normal activity with effort; some signs or symptoms of disease. 42   - Cares for self; unable to carry on normal activity or to do active work. 60   - Requires occasional assistance, but is able to care for most of his personal needs. 50   - Requires considerable assistance and frequent medical care. 40   - Disabled; requires special care and assistance. 30   - Severely disabled; hospital admission is indicated although death not imminent. 20   - Very sick; hospital admission necessary; active supportive treatment necessary. 10   - Moribund; fatal processes progressing rapidly. 0     - Dead  Karnofsky DA, Abelmann WH, Craver LS and Burchenal [facility] [phone]) The use of the nitrogen mustards in the palliative treatment of carcinoma: with particular reference to bronchogenic carcinoma Cancer 1 [id number]  LABORATORY DATA:  Lab Results  Component Value Date   WBC 6.2 01/19/2024   HGB 10.2 (L) 01/19/2024   HCT 33.0 (L) 01/19/2024   MCV 77.1 (L) 01/19/2024   PLT 252 01/19/2024   Lab Results  Component Value Date   NA 140 01/19/2024   K 4.7 01/19/2024   CL 107 01/19/2024   CO2 25 01/19/2024   Lab Results  Component Value Date   ALT 17 01/19/2024   AST 14 (L) 01/19/2024   ALKPHOS 92 01/19/2024   BILITOT 0.4 01/19/2024     RADIOGRAPHY: CT BONE TROCAR/NEEDLE BIOPSY DEEP Result Date: 02/15/2024 INDICATION: 68 year old male with a history of prostate cancer and sclerotic lesions with hypermetabolic activity on PET-CT. He presents for CT-guided biopsy. EXAM: CT-guided bone biopsy MEDICATIONS: None.  ANESTHESIA/SEDATION: 1 mg Versed  administered for anxiolysis. COMPLICATIONS: None immediate. PROCEDURE: Informed written consent was obtained from the patient after a thorough discussion of the procedural risks, benefits and alternatives. All questions were addressed. Maximal Sterile Barrier Technique was utilized including caps, mask, sterile gowns, sterile gloves, sterile drape, hand hygiene and skin antiseptic. A timeout was performed prior to the initiation of the procedure. A planning CT scan was performed. The subtle sclerotic lesion with associated hypermetabolic activity in the left iliac bone is identified. A suitable skin entry site is selected and marked. Local anesthesia is attained by infiltration with 1% lidocaine . A small dermatotomy is made. Under intermittent CT guidance, the trocar device was advanced through the cortex and marrow space and positioned at the margin of the mass. The on control drill was utilized. Next, the coaxial biopsy system was used to obtain 2 core specimens from the lesion. Images were documented with CT imaging. The biopsy devices were removed. Hemostasis was attained by manual pressure. IMPRESSION: Successful CT-guided biopsy of left iliac sclerotic lesion. Electronically Signed   By: Fernando Hoyer M.D.   On: 02/15/2024 12:10   NM PET (PSMA) SKULL TO MID THIGH Result Date: 01/30/2024 CLINICAL DATA:  High-risk prostate carcinoma. Sclerotic bone lesions. EXAM: NUCLEAR MEDICINE PET SKULL BASE TO THIGH TECHNIQUE: 7.6 mCi Flotufolastat (Posluma ) was injected intravenously. Full-ring PET imaging was performed from the skull base to thigh after the radiotracer. CT data was obtained and used for attenuation correction and anatomic localization. COMPARISON:  None Available. FINDINGS: NECK No radiotracer activity in neck lymph nodes. Incidental CT finding: None. CHEST No radiotracer accumulation within mediastinal or hilar lymph nodes. No suspicious pulmonary nodules on the CT  scan. Incidental CT finding: None. ABDOMEN/PELVIS Prostate: Intense radiotracer accumulation is seen in the  anterior and left lateral prostate, with SUV max of 19.9, consistent with primary prostate carcinoma. Lymph nodes: 5 mm right internal iliac lymph node shows intense radiotracer uptake, with SUV max of 14.0, consistent with the metastasis. No other sites of abnormal radiotracer accumulation within pelvic or abdominal nodes. Liver: No evidence of liver metastasis. Incidental CT finding: Prior gastric bypass surgery, with small hiatal hernia. Diffuse colonic distension by large stool burden noted. Bilateral renal calculi are seen with partial staghorn calculus in the left renal collecting system. SKELETON Numerous sclerotic bone lesions are seen is throughout the axial skeleton and proximal appendicular skeleton which show intense radiotracer accumulation, consistent with diffuse bone metastases. IMPRESSION: Intense radiotracer accumulation in the anterior and left lateral prostate, consistent with primary prostate carcinoma. 5 mm right internal iliac lymph node with intense radiotracer accumulation, consistent with metastatic disease. Diffuse sclerotic bone metastases. Bilateral nephrolithiasis.  No hydronephrosis. Diffuse colonic distension by large stool burden. Recommend clinical correlation for possible constipation. Previous gastric bypass surgery with small hiatal hernia. Electronically Signed   By: Marlyce Sine M.D.   On: 01/30/2024 13:29      IMPRESSION/PLAN: 1. 68 y.o. man with painful *** secondary to metastatic prostate cancer  Today, we talked to the patient and family about the findings and workup thus far. We discussed the natural history of metastatic prostate cancer and general treatment, highlighting the role of radiotherapy in the management of painful osseous metastatic disease. We discussed the available radiation techniques, and focused on the details and logistics of delivery. We  reviewed the anticipated acute and late sequelae associated with radiation in this setting. The patient was encouraged to ask questions that were answered to his satisfaction.  At the end of our discussion, the patient ***   Given current concerns for patient exposure during the COVID-19 pandemic, this encounter was conducted via video-enabled WebEx visit. The patient has given verbal consent for this type of encounter. The time spent during this encounter was *** minutes. The attendants for this meeting include Kenith Payer Antonio Oliver, Aviva Lemmings, patient Eliodoro Guerin {and ***.} During the encounter, Kenith Payer Antonio Oliver and Keitha Pata PA-C were located at [facility] Radiation Oncology Department.  Patient KINGSTYN DERUITER {and *** were} was located at home.     Arta Bihari, PA-C    Kenith Payer, Antonio Oliver  [facility]  Radiation Oncology Direct Dial: [phone]  Fax: [phone] [email or web address]  Skype  [email or web address]   This document serves as a record of services personally performed by Kenith Payer, Antonio Oliver and Keitha Pata, PA-C. It was created on their behalf by Florance Hun, a trained medical scribe. The creation of this record is based on the scribe's personal observations and the provider's statements to them. This document has been checked and approved by the attending provider.

## 2024-02-20 NOTE — Telephone Encounter (Signed)
 Oral Oncology Patient Advocate Encounter  Prior Authorization for Sharyne Degree has been approved.    PA# PA Case: [id number] Effective dates: 09/07/23 through 09/05/24  Patients co-pay is $0.   Roda Cirri, CPhT Specialty Pharmacy Patient Advocate Phone: [phone] Fax: [phone]

## 2024-02-20 NOTE — Assessment & Plan Note (Addendum)
  Supportive baseline bone mineral density study in the future calcium  (1000-1200 mg daily from food and supplements) and vitamin D3 (1000 IU daily) Healthy lifestyle to prevent diabetes and CV disease Aggressive cardiovascular risk management Limit alcohol consumption and avoid smoking

## 2024-02-20 NOTE — Progress Notes (Incomplete)
 Histology and Location of Primary Cancer: Prostate Ca  Antonio Oliver presented as referral from Dr. Janeann Mean [facility]) prostate cancer metastatic to bone.  Sites of Visceral and Bony Metastatic Disease: Axial Skeleton/Proximal appendicular skeleton, Right internal iliac lymph node, and left iliac sclerotic lesion  Location(s) of Symptomatic Metastases: Axial Skeleton/Proximal appendicular skeleton, Right internal iliac lymph node, and left iliac sclerotic lesion  02/15/2024 [name], PA-C CT Bone Trocar/Needle Biopsy Deep INDICATION:  68 year old male with a history of prostate cancer and sclerotic lesions with hypermetabolic activity on PET-CT. He presents for CT-guided biopsy.  IMPRESSION:  Successful CT-guided biopsy of left iliac sclerotic lesion.   01/27/2024 Darilyn Edin NM PET (PSMA) Skull to Mid Thigh CLINICAL DATA:  High-risk prostate carcinoma. Sclerotic bone lesions.  IMPRESSION: Intense radiotracer accumulation in the anterior and left lateral prostate, consistent with primary prostate carcinoma. 5 mm right internal iliac lymph node with intense radiotracer accumulation, consistent with metastatic disease. Diffuse sclerotic bone metastases. Bilateral nephrolithiasis.  No hydronephrosis. Diffuse colonic distension by large stool burden. Recommend clinical correlation for possible constipation. Previous gastric bypass surgery with small hiatal hernia.  Past/Anticipated chemotherapy by medical oncology, if any:   Dr. Janeann Mean   Pain on a scale of 0-10 is:  8/10 pain all over body (shoulder, back, neck, feet)  If Spine Met(s), symptoms, if any, include: Bowel/Bladder retention or incontinence (please describe): Constipation take Miralax  as needed. Encouraged to drink more. Numbness or weakness in extremities (please describe):  Yes, both upper and lower extremities.  Numbness in fingers. Current Decadron  regimen, if applicable:  No  Ambulatory status? Walker? Wheelchair?:  Uses power w/c, walker, cane, Rolator.  SAFETY ISSUES: Prior radiation? No Pacemaker/ICD? Yes Possible current pregnancy? Male Is the patient on methotrexate?  No  Current Complaints / other details:

## 2024-02-20 NOTE — Telephone Encounter (Signed)
 Oral Oncology Patient Advocate Encounter   Received notification from[employer] that prior authorization for Nubeqa is required/requested.   Insurance verification completed.   The patient is insured through [employer] .   Per test claim: PA required; PA submitted to above mentioned insurance via CoverMyMeds Key/confirmation #/EOC [facility] Status is pending

## 2024-02-21 ENCOUNTER — Encounter: Payer: Self-pay | Admitting: Radiation Oncology

## 2024-02-21 ENCOUNTER — Ambulatory Visit

## 2024-02-21 ENCOUNTER — Ambulatory Visit
Admission: RE | Admit: 2024-02-21 | Discharge: 2024-02-21 | Disposition: A | Discharge status: Home / Self Care | Source: Ambulatory Visit | Attending: Radiation Oncology | Admitting: Radiation Oncology

## 2024-02-21 ENCOUNTER — Other Ambulatory Visit: Payer: Self-pay

## 2024-02-21 ENCOUNTER — Other Ambulatory Visit (HOSPITAL_COMMUNITY): Payer: Self-pay

## 2024-02-21 ENCOUNTER — Emergency Department (HOSPITAL_BASED_OUTPATIENT_CLINIC_OR_DEPARTMENT_OTHER)

## 2024-02-21 ENCOUNTER — Encounter: Payer: Self-pay | Admitting: Cardiology

## 2024-02-21 ENCOUNTER — Emergency Department (HOSPITAL_BASED_OUTPATIENT_CLINIC_OR_DEPARTMENT_OTHER)
Admission: EM | Admit: 2024-02-21 | Discharge: 2024-02-21 | Disposition: A | Discharge status: Home / Self Care | Attending: Emergency Medicine | Admitting: Emergency Medicine

## 2024-02-21 ENCOUNTER — Ambulatory Visit
Admission: RE | Admit: 2024-02-21 | Discharge: 2024-02-21 | Disposition: A | Discharge status: Home / Self Care | Source: Ambulatory Visit | Attending: Urology | Admitting: Urology

## 2024-02-21 ENCOUNTER — Encounter (HOSPITAL_BASED_OUTPATIENT_CLINIC_OR_DEPARTMENT_OTHER): Payer: Self-pay

## 2024-02-21 ENCOUNTER — Other Ambulatory Visit: Payer: Self-pay | Admitting: Pharmacy Technician

## 2024-02-21 ENCOUNTER — Telehealth: Payer: Self-pay

## 2024-02-21 DIAGNOSIS — S300XXA Contusion of lower back and pelvis, initial encounter: Secondary | ICD-10-CM | POA: Diagnosis present

## 2024-02-21 DIAGNOSIS — C7951 Secondary malignant neoplasm of bone: Secondary | ICD-10-CM | POA: Insufficient documentation

## 2024-02-21 DIAGNOSIS — C61 Malignant neoplasm of prostate: Secondary | ICD-10-CM

## 2024-02-21 DIAGNOSIS — Z510 Encounter for antineoplastic radiation therapy: Secondary | ICD-10-CM | POA: Insufficient documentation

## 2024-02-21 DIAGNOSIS — X58XXXA Exposure to other specified factors, initial encounter: Secondary | ICD-10-CM | POA: Diagnosis not present

## 2024-02-21 DIAGNOSIS — Z7982 Long term (current) use of aspirin: Secondary | ICD-10-CM | POA: Insufficient documentation

## 2024-02-21 DIAGNOSIS — Z8546 Personal history of malignant neoplasm of prostate: Secondary | ICD-10-CM | POA: Diagnosis not present

## 2024-02-21 LAB — CBC WITH DIFFERENTIAL/PLATELET
Abs Immature Granulocytes: 0.01 10*3/uL (ref 0.00–0.07)
Basophils Absolute: 0.1 10*3/uL (ref 0.0–0.1)
Basophils Relative: 2 %
Eosinophils Absolute: 0.6 10*3/uL — ABNORMAL HIGH (ref 0.0–0.5)
Eosinophils Relative: 12 %
HCT: 32.8 % — ABNORMAL LOW (ref 39.0–52.0)
Hemoglobin: 10.5 g/dL — ABNORMAL LOW (ref 13.0–17.0)
Immature Granulocytes: 0 %
Lymphocytes Relative: 33 %
Lymphs Abs: 1.5 10*3/uL (ref 0.7–4.0)
MCH: 26.5 pg (ref 26.0–34.0)
MCHC: 32 g/dL (ref 30.0–36.0)
MCV: 82.8 fL (ref 80.0–100.0)
Monocytes Absolute: 0.5 10*3/uL (ref 0.1–1.0)
Monocytes Relative: 10 %
Neutro Abs: 2 10*3/uL (ref 1.7–7.7)
Neutrophils Relative %: 43 %
Platelets: 172 10*3/uL (ref 150–400)
RBC: 3.96 MIL/uL — ABNORMAL LOW (ref 4.22–5.81)
RDW: 21.9 % — ABNORMAL HIGH (ref 11.5–15.5)
WBC: 4.6 10*3/uL (ref 4.0–10.5)
nRBC: 0 % (ref 0.0–0.2)

## 2024-02-21 LAB — BASIC METABOLIC PANEL WITH GFR
Anion gap: 10 (ref 5–15)
BUN: 19 mg/dL (ref 8–23)
CO2: 24 mmol/L (ref 22–32)
Calcium: 9.2 mg/dL (ref 8.9–10.3)
Chloride: 105 mmol/L (ref 98–111)
Creatinine, Ser: 0.99 mg/dL (ref 0.61–1.24)
GFR, Estimated: >60 mL/min (ref >60)
Glucose, Bld: 83 mg/dL (ref 70–99)
Potassium: 4.5 mmol/L (ref 3.5–5.1)
Sodium: 139 mmol/L (ref 135–145)

## 2024-02-21 MED ORDER — BICALUTAMIDE 50 MG PO TABS
50.0000 mg | ORAL_TABLET | Freq: Every day | ORAL | 0 refills | Status: AC
  Filled 2024-02-21 (×2): qty 5, 5d supply, fill #0

## 2024-02-21 MED ORDER — IOHEXOL 300 MG/ML  SOLN
100.0000 mL | Freq: Once | INTRAMUSCULAR | Status: AC | PRN
  Administered 2024-02-21: 100 mL via INTRAVENOUS

## 2024-02-21 MED ORDER — BICALUTAMIDE 50 MG PO TABS: 50.0000 mg | ORAL_TABLET | Freq: Every day | ORAL | 0 refills | Status: DC

## 2024-02-21 NOTE — Telephone Encounter (Signed)
 Received palliative care referral, RN called pt wife (only number in chart) and scheduled upcoming palliative appt. Education provided on palliative care, no further needs at this time.

## 2024-02-21 NOTE — ED Triage Notes (Signed)
 Patient arrives POV with complaints of enlarging pain sight to back that started today (suspected hematoma, per wife). Patient had a biopsy to the site one week ago. He went to an appointment today where he was positioned a certain way, and the site started swelling and becoming painful throughout the day. 9/10.

## 2024-02-21 NOTE — Progress Notes (Signed)
 Patient counseled on [place] and Orgovyx in telephone encounter opened on 02/20/2024.   Breyanna Valera, PharmD Hematology/Oncology Clinical Pharmacist [facility] [phone]

## 2024-02-21 NOTE — ED Notes (Signed)
 Patient transported to CT

## 2024-02-21 NOTE — Progress Notes (Signed)
 TO BE COMPLETED BY RADIATION ONCOLOGIST OFFICE:   Patient Name: Antonio Oliver   Date of Birth: [birth year]   Radiation Oncologist: Dr. Kenith Payer   Site to be Treated: C-spine, L-spine, and Right ribs   Will x-rays >10 MV be used? No   Will the radiation be >10 cm from the device? Yes   Planned Treatment Start Date: 02/22/2024   TO BE COMPLETED BY CARDIOLOGIST OFFICE:   Device Information:  Pacemaker [x]      ICD []    Brand: [employer]: [phone] Model #: 2272 Assurity MRI  Serial Number: [id number]     Date of Placement: 03/15/2017  Site of Placement: left upper chest  Remote Device Check--Frequency: q 91 days Last Check: 01/26/2024  Is the Patient Pacer Dependent?:  Yes []   No [x]   Does cardiologist request Radiation Oncology to schedule device testing by vendor for the following:  Prior to the Initiation of Treatments?  Yes []  No [x]  During Treatments?  Yes []  No [x]  Post Radiation Treatments?  Yes []  No [x]   Is device monitoring necessary by vendor/cardiologist team during treatments?  Yes []   No [x]   Is cardiac monitoring by Radiation Oncology nursing necessary during treatments? Yes []   No [x]   Do you recommend device be relocated prior to Radiation Treatment? Yes []   No [x]   **PLEASE LIST ANY NOTES OR SPECIAL REQUESTS: Will schedule remote check at completion of radiation therapy.  Pt has in clinic scheduled follow up already scheduled for 04/13/2024.     CARDIOLOGIST SIGNATURE:  Dr. Agatha Horsfall Per Device Clinic Standing Orders, Forrestine Ike  02/21/2024 4:27 PM  **Please route completed form back to Radiation Oncology Nursing and P [facility] RAD ONC ADMIN, OR send an update if there will be a delay in having form completed by expected start date.  **Call [phone] if you have any questions or do not get an in-basket response from a Radiation Oncology staff member

## 2024-02-21 NOTE — Discharge Instructions (Signed)
 You were seen in the emergency department for an expanding mass in your lower back.  Your CAT scan showed a fluid collection likely a bleeding episode from your recent biopsy.  You can try ice to the affected area.  Abdominal binder may also help.  Follow-up with your treatment team.  Return if any worsening or concerning symptoms

## 2024-02-21 NOTE — ED Notes (Signed)
 Reviewed AVS/discharge instruction with patient. Time allotted for and all questions answered. Patient is agreeable for d/c and escorted to ed exit by staff.

## 2024-02-21 NOTE — Telephone Encounter (Signed)
 Oral Chemotherapy Pharmacist Encounter   Patient Education I spoke with patient and patients wife for overview of new oral chemotherapy medication: Nubeqa (darolutamide) for the treatment of metastatic hormone sensitive prostate cancer in conjunction with Orgovyx and docetaxel dependent on how patient is doing and if tolerated, planned duration for Nubeqa until disease progression or unacceptable drug toxicity.  Patient and patients wife are aware that they will stop the bicalutamide prior to starting the [place]     Pt is doing well. Counseled patient on administration, dosing, side effects, monitoring, drug-food interactions, safe handling, storage, and disposal.   Patient will take Nubeqa 300 mg tablet, 2 tablets (600 mg total) by mouth 2 (two) times daily with a meal. Patient will take Orgovyx 120mg  tablets, 3 tablets (360mg ) by mouth on day 1 and then 1 tablet (120mg ) daily.    Patient knows to avoid grapefruit and grapefruit juice while on Nubeqa.   Start date for both [place] and Orgovyx: 02/23/24 or 02/24/24 (depending on when pt/ pts wife is able to pick up from [facility])   Side effects include but not limited to: changes in LFTs, HTN, fatigue, muscle aches, hot flashes, and changes of electrolytes. Patient aware to modify for any changes with the Ubrelvy  as patient takes as needed for migraines as Nubeqa may increase or decrease the concentration of the Ubrelvy . Patients wife aware to call the office or the PCP office if patient experiences any changes from usage.    Reviewed with patient importance of keeping a medication schedule and plan for any missed doses.   After discussion with patient no patient barriers to medication adherence identified.    Patients wife voiced understanding and appreciation. All questions answered. Medication handout provided.   Provided patient with Oral Chemotherapy Navigation Clinic phone number. Patient knows to call the office with questions or  concerns.Patient has follow up visit with MD and labs on 03/05/24.   Melaya Hoselton, PharmD Hematology/Oncology Clinical Pharmacist [facility] Oral Chemotherapy Navigation [facility] [phone] 02/21/2024 11:27 AM

## 2024-02-21 NOTE — Progress Notes (Signed)
 Specialty Pharmacy Initial Fill Coordination Note  Antonio Oliver is a 68 y.o. male contacted today regarding refills of specialty medication(s) Darolutamide (NUBEQA); Relugolix (ORGOVYX) .  Patient requested [name] at [facility] at [place]  on 02/22/24   Medication will be filled on 02/21/24.   Patient is aware of $0 copayment. (Pt has apt tomorrow am, but if it's not ready until the afternoon, his wife will get it then or Thursday morning when he's back for another apt.)

## 2024-02-21 NOTE — ED Provider Notes (Signed)
 [place] EMERGENCY DEPARTMENT AT [facility] Provider Note   CSN: [id number] Arrival date & time: 02/21/24  1709     Patient presents with: Back Problem and Skin Problem   Antonio Oliver is a 68 y.o. male.  He has a history of prostate cancer and had a left iliac wing biopsy of the lesion about a week ago.  He was at the radiation oncologist appointment today for simulation to start radiation.  Started having pain at the site of his biopsy.  Wife noticed swelling that initially was the size of a ping-pong ball that was getting larger they were driving home.  Came here for further evaluation.  He is on a baby aspirin .  Has had no drainage from the site.  Moderate amount of pain.  No new numbness or weakness.  No fever.   The history is provided by the patient.  Back Pain Pain location: left paralumbar/iliac crest. Quality:  Aching Radiates to:  Does not radiate Pain severity:  Moderate Pain is:  Same all the time Onset quality:  Gradual Duration:  1 day Timing:  Constant Progression:  Worsening Chronicity:  New Worsened by:  Palpation Ineffective treatments:  None tried Associated symptoms: no abdominal pain and no chest pain        Prior to Admission medications   Medication Sig Start Date End Date Taking? Authorizing Provider  amoxicillin  (AMOXIL ) 500 MG capsule TAKE 4 CAPSULES BY MOUTH 1 HOUR PRIOR TO DENTAL APPOINTMENT 12/05/23   Lenise Quince, MD  aspirin  EC 81 MG tablet Take 81 mg by mouth daily.    [provider]  atorvastatin  (LIPITOR) 20 MG tablet Take 20 mg by mouth daily at 6 PM.     [provider]  baclofen (LIORESAL) 10 MG tablet Take 10 mg by mouth 3 (three) times daily. 12/07/23   [provider]  bicalutamide (CASODEX) 50 MG tablet Take 1 tablet (50 mg total) by mouth daily for 5 days. 02/21/24 02/26/24  Lowanda Ruddy, MD  buPROPion  (WELLBUTRIN  SR) 200 MG 12 hr tablet Take 200 mg by mouth 2 (two) times daily. 07/20/23    [provider]  busPIRone  (BUSPAR ) 10 MG tablet Take 20 mg by mouth 2 (two) times daily. 09/01/23   [provider]  CALCIUM  CITRATE PO Take 500 mg by mouth 2 (two) times daily.    [provider]  Cholecalciferol  (VITAMIN D -3 PO) Take 5,000 capsules by mouth daily.    [provider]  clindamycin (CLEOCIN) 300 MG capsule Take 300 mg by mouth 3 (three) times daily. 02/11/24   [provider]  clobetasol (TEMOVATE) 0.05 % external solution Apply 1 Application topically as needed. 08/06/22   [provider]  clonazePAM  (KLONOPIN ) 0.5 MG tablet Take 0.5 mg by mouth 2 (two) times daily as needed for anxiety.    [provider]  darolutamide (NUBEQA) 300 MG tablet Take 2 tablets (600 mg total) by mouth 2 (two) times daily with a meal. 02/20/24   Lowanda Ruddy, MD  diclofenac Sodium (VOLTAREN) 1 % GEL Apply 1 application  topically as needed. 12/25/20   [provider]  dicyclomine (BENTYL) 20 MG tablet Take 20 mg by mouth 4 (four) times daily as needed. 09/01/23   [provider]  docusate sodium  (COLACE) 100 MG capsule Take 1 capsule (100 mg total) by mouth 2 (two) times daily. 01/13/21   Darus Engels A, DO  DULoxetine  (CYMBALTA ) 60 MG capsule Take 60  mg by mouth 2 (two) times daily.     [provider]  fentaNYL  (DURAGESIC ) 50 MCG/HR Place 1 patch onto the skin every 3 (three) days. 02/17/24   Thayil, Irene T, PA-C  ferrous sulfate  325 (65 FE) MG EC tablet Take 325 mg by mouth daily.    [provider]  finasteride  (PROSCAR ) 5 MG tablet Take 5 mg by mouth daily.    [provider]  fluticasone  (FLONASE ) 50 MCG/ACT nasal spray Place 2 sprays into the nose daily as needed for allergies or rhinitis.    [provider]  folic acid  (FOLVITE ) 1 MG tablet Take 1 mg by mouth daily.    [provider]  furosemide  (LASIX ) 20 MG tablet Take 1 tablet (20 mg total) by mouth as needed (for leg  swelling). 07/12/19   Revankar, Micael Adas, MD  Galcanezumab -gnlm (EMGALITY ) 120 MG/ML SOAJ Inject 120 mg into the skin every 28 (twenty-eight) days. 12/26/23   Merriam Abbey, DO  hydrALAZINE  (APRESOLINE ) 10 MG tablet Take 1 tablet BY MOUTH three times daily for systolic bp > 140 or diastolic bp > 90 1/61/09   Crenshaw, Deannie Fabian, MD  insulin  lispro (HUMALOG) 100 UNIT/ML injection as directed Injection as needed    [provider]  iron  sucrose 200 mg in sodium chloride  0.9 % 100 mL Inject 200 mg into the vein once a week.    [provider]  levothyroxine  (SYNTHROID ) 125 MCG tablet Take 125 mcg by mouth daily.     [provider]  lidocaine  (LIDODERM ) 5 % as needed. 06/07/22   [provider]  loratadine  (CLARITIN ) 10 MG tablet Take 10 mg by mouth daily as needed for allergies.    [provider]  Magnesium  250 MG TABS 2 tablets (500mg ) Orally Twice a day    [provider]  MAGNESIUM  PO Take 500 mg by mouth in the morning and at bedtime.    [provider]  meclizine (ANTIVERT) 25 MG tablet Take 25 mg by mouth 3 (three) times daily as needed for dizziness. 04/02/22   [provider]  metFORMIN  (GLUCOPHAGE ) 1000 MG tablet Take 1,000 mg by mouth 2 (two) times daily with a meal.    [provider]  mexiletine (MEXITIL) 250 MG capsule Take 1 capsule (250 mg total) by mouth 2 (two) times daily. 01/10/24   Camnitz, Babetta Lesch, MD  mirtazapine  (REMERON ) 30 MG tablet 1 tablet at bedtime Orally Once a day    [provider]  Multiple Vitamins-Minerals (MULTIVITAMIN WITH MINERALS) tablet Take 1 tablet by mouth daily.    [provider]  Multiple Vitamins-Minerals (PRESERVISION/LUTEIN) CAPS Take 1 capsule by mouth at bedtime.    [provider]  mupirocin  ointment (BACTROBAN ) 2 % Apply 1 application topically 3 (three) times daily as needed (infection). 11/14/20   [provider]  naloxone  (NARCAN ) nasal  spray 4 mg/0.1 mL Instill or spray into the nose if you are experiencing shortness of breath or signs of opioid overdose. 01/21/21   Nitka, James E, MD  omeprazole (PRILOSEC) 20 MG capsule Take 20 mg by mouth 2 (two) times daily before a meal.     [provider]  oxyCODONE -acetaminophen  (PERCOCET) 10-325 MG tablet Take 0.5 tablet by mouth every 3 hours as directed. 02/20/24   Lowanda Ruddy, MD  polyethylene glycol (MIRALAX  / GLYCOLAX ) 17 g packet Take 17 g by mouth daily as needed.    [provider]  potassium chloride  (  KLOR-CON ) 10 MEQ tablet Take 10 mEq by mouth as needed (with lasix  dose).    [provider]  potassium chloride  SA (KLOR-CON  M) 20 MEQ tablet Take 20 mEq by mouth as needed.    [provider]  pregabalin  (LYRICA ) 100 MG capsule Take 100 mg by mouth 3 (three) times daily. 04/05/22   [provider]  prochlorperazine (COMPAZINE) 5 MG tablet Take 1 tablet (5 mg total) by mouth every 6 (six) hours as needed for nausea or vomiting. 02/20/24   Lowanda Ruddy, MD  pseudoephedrine (SUDAFED) 30 MG tablet Take 30 mg by mouth every 6 (six) hours as needed.    [provider]  relugolix (ORGOVYX) 120 MG tablet Take 3 tablets (360 mg total) by mouth on day 1, then take 1 tablet (120 mg total) daily thereafter. 02/20/24   Lowanda Ruddy, MD  Semaglutide ,0.25 or 0.5MG /DOS, (OZEMPIC , 0.25 OR 0.5 MG/DOSE,) 2 MG/3ML SOPN Inject 0.5 mg into the skin once a week. 12/26/23     silodosin (RAPAFLO) 8 MG CAPS capsule Take 8 mg by mouth daily. 09/22/21   [provider]  solifenacin (VESICARE) 10 MG tablet Take 10 mg by mouth at bedtime.    [provider]  tiZANidine  (ZANAFLEX ) 4 MG tablet Take 4 mg by mouth 3 (three) times daily. 04/05/22   [provider]  traMADol  (ULTRAM ) 50 MG tablet Take 50 mg by mouth 3 (three) times daily.    [provider]  Ubrogepant  (UBRELVY ) 100 MG TABS Take 1 tablet (100 mg total) by mouth  as needed (May repeat in 2 hours.  Maximum 2 tablets in 24 hours.). 08/30/23   Merriam Abbey, DO    Allergies: Calcium  carbonate and Ms contin [morphine]    Review of Systems  Cardiovascular:  Negative for chest pain.  Gastrointestinal:  Negative for abdominal pain.  Musculoskeletal:  Positive for back pain.    Updated Vital Signs BP (!) 141/98 (BP Location: Right Arm)   Pulse 82   Temp (!) 96.9 F (36.1 C)   Resp 16   Ht 6' 3 (1.905 m)   Wt 90.9 kg   SpO2 99%   BMI 25.05 kg/m   Physical Exam Vitals and nursing note reviewed.  Constitutional:      General: He is not in acute distress.    Appearance: He is well-developed.  HENT:     Head: Normocephalic and atraumatic.   Eyes:     Conjunctiva/sclera: Conjunctivae normal.    Cardiovascular:     Rate and Rhythm: Normal rate and regular rhythm.     Heart sounds: No murmur heard. Pulmonary:     Effort: Pulmonary effort is normal. No respiratory distress.     Breath sounds: Normal breath sounds.  Abdominal:     Palpations: Abdomen is soft.     Tenderness: There is no abdominal tenderness.   Musculoskeletal:        General: Tenderness present.     Cervical back: Neck supple.     Comments: Patient has a approximate 8 cm tender mass over the left iliac crest and lower back area.  No overlying erythema   Skin:    General: Skin is warm and dry.     Capillary Refill: Capillary refill takes less than 2 seconds.   Neurological:     General: No focal deficit present.     Mental Status: He is alert.   Psychiatric:        Mood and  Affect: Mood normal.     (all labs ordered are listed, but only abnormal results are displayed) Labs Reviewed  CBC WITH DIFFERENTIAL/PLATELET - Abnormal; Notable for the following components:      Result Value   RBC 3.96 (*)    Hemoglobin 10.5 (*)    HCT 32.8 (*)    RDW 21.9 (*)    Eosinophils Absolute 0.6 (*)    All other components within normal limits  BASIC METABOLIC PANEL WITH  GFR    EKG: None  Radiology: CT PELVIS W CONTRAST Result Date: 02/21/2024 CLINICAL DATA:  Hematoma post biopsy EXAM: CT PELVIS WITH CONTRAST TECHNIQUE: Multidetector CT imaging of the pelvis was performed using the standard protocol following the bolus administration of intravenous contrast. RADIATION DOSE REDUCTION: This exam was performed according to the departmental dose-optimization program which includes automated exposure control, adjustment of the mA and/or kV according to patient size and/or use of iterative reconstruction technique. CONTRAST:  OMNIPAQUE  IOHEXOL  300 MG/ML  SOLN COMPARISON:  CT 02/15/2024, PET CT 01/27/2024 FINDINGS: Urinary Tract: Imaged adrenal glands are within normal limits. Multiple left kidney stones, including large renal pelvis stone measuring up to 26 mm. There is mild hydronephrosis of the upper pole of the left kidney. Small nonobstructing right kidney stones. Urinary bladder is unremarkable. Bowel:  No acute bowel wall thickening.  Moderate stool in the colon Vascular/Lymphatic: Aortic atherosclerosis. No aneurysm. No suspicious lymph nodes. Reproductive:  Negative prostate.  Bilateral scrotal hydroceles Other:  Negative for pelvic effusion or free air Musculoskeletal: Multiple sclerotic lesions within the spine and pelvis suspicious for metastatic disease. Lumbar fusion hardware L4 through S1 with dominant sclerotic lesion in the L4 vertebral body. No acute fracture is seen. Interim development of fluid within the subcutaneous soft tissues of the lower back. Hyperdense collection within the subcutaneous soft tissues of the left posterior paraspinal region, this measures approximately 8.8 by 2.3 by 12.6 cm and would be consistent with a soft tissue hematoma. Trace focus of hyperdensity within the hematoma, series 2, image 26, with fairly extensive artifact from adjacent lumbar spine hardware. IMPRESSION: 1. Interim development of fluid within the subcutaneous soft  tissues of the lower back. Hyperdense collection within the subcutaneous soft tissues of the left posterior paraspinal region measuring up to 12.6 cm consistent with a soft tissue hematoma. Trace focus of hyperdensity within the hematoma, could be related to artifact from adjacent lumbar spine hardware, with trace focus of extravasation not excluded. 2. Multiple sclerotic lesions within the spine and pelvis suspicious for metastatic disease. 3. Multiple left kidney stones, including large renal pelvis stone measuring up to 26 mm. There is mild hydronephrosis of the upper pole of the left kidney. Small nonobstructing right kidney stones. 4. Bilateral scrotal hydroceles. 5. Aortic atherosclerosis. Aortic Atherosclerosis (ICD10-I70.0). Electronically Signed   By: Esmeralda Hedge M.D.   On: 02/21/2024 19:22     Procedures   Medications Ordered in the ED - No data to display  Clinical Course as of 02/22/24 1026  Tue Feb 21, 2024  1824 Hemoglobin stable and white count normal. [MB]  1936 Patient CT shows hematoma.  They are comfortable plan for discharge.  Will trial him with an abdominal binder to see if that helps.  Recommended that he reach out to his radiation oncologist tomorrow for recheck. [MB]    Clinical Course User Index [MB] Tonya Fredrickson, MD  Medical Decision Making Amount and/or Complexity of Data Reviewed Labs: ordered. Radiology: ordered.  Risk Prescription drug management.   This patient complains of painful mass on back; this involves an extensive number of treatment Options and is a complaint that carries with it a high risk of complications and morbidity. The differential includes hematoma, active bleeding, mass, tumor  I ordered, reviewed and interpreted labs, which included CBC with normal white count, stable hemoglobin, chemistries unremarkable I ordered imaging studies which included CT pelvis and I independently    visualized and  interpreted imaging which showed soft tissue mass likely hematoma Additional history obtained from patient's wife Previous records obtained and reviewed recent notes in epic Cardiac monitoring reviewed, sinus rhythm Social determinants considered, patient with housing and utility insecurity Critical Interventions: None  After the interventions stated above, I reevaluated the patient and found patient to be neuro intact in no distress Admission and further testing considered, no indications for admission or further workup at this time.  He will try using abdominal binder to see if that gives him a little compressive action on the hematoma.  He is post to see radiation oncology tomorrow so they can reassess.  He is comfortable plan for discharge and outpatient follow-up.      Final diagnoses:  Traumatic hematoma of lower back, initial encounter    ED Discharge Orders     None          Tonya Fredrickson, MD 02/22/24 1028

## 2024-02-21 NOTE — Telephone Encounter (Signed)
 RN received call from patient wife they were just in for CT simulation appointment this afternoon.  Mrs. Zalewski reports Mr. Doberstein was having increased pain to he biopsy site.  Reports had biopsy done last Wednesday 02/15/2024.  She feels increase back pain comes from Mr. Fetters being placed in position on table during planning portion of visit.  Reports a swollen area size of golf ball when then left she put ice to area.  Reports increased in size to baseball.  Pain scale to his back is 9/10 with generalized body all over 8/10.  Mrs. Glasscock feels there may be some bleeding beneath the skin surface none observed outside of body no brake in skin.  Reports Mr. Marcellus increase pain make it difficult to ambulate.  Advised to get to emergency room for evaluation.  Reports she will take him to [facility].  Radiation team made aware and Dr. Lorri Rota made aware.

## 2024-02-22 ENCOUNTER — Encounter: Payer: Self-pay | Admitting: Medical Oncology

## 2024-02-22 ENCOUNTER — Other Ambulatory Visit: Payer: Self-pay

## 2024-02-22 ENCOUNTER — Ambulatory Visit
Admission: RE | Admit: 2024-02-22 | Discharge: 2024-02-22 | Disposition: A | Discharge status: Home / Self Care | Source: Ambulatory Visit | Attending: Radiation Oncology | Admitting: Radiation Oncology

## 2024-02-22 ENCOUNTER — Other Ambulatory Visit (HOSPITAL_COMMUNITY): Payer: Self-pay

## 2024-02-22 DIAGNOSIS — Z510 Encounter for antineoplastic radiation therapy: Secondary | ICD-10-CM | POA: Diagnosis not present

## 2024-02-22 LAB — RAD ONC ARIA SESSION SUMMARY
Course Elapsed Days: 0
Plan Fractions Treated to Date: 1
Plan Fractions Treated to Date: 1
Plan Fractions Treated to Date: 1
Plan Prescribed Dose Per Fraction: 3 Gy
Plan Prescribed Dose Per Fraction: 3 Gy
Plan Prescribed Dose Per Fraction: 3 Gy
Plan Total Fractions Prescribed: 10
Plan Total Fractions Prescribed: 10
Plan Total Fractions Prescribed: 10
Plan Total Prescribed Dose: 30 Gy
Plan Total Prescribed Dose: 30 Gy
Plan Total Prescribed Dose: 30 Gy
Reference Point Dosage Given to Date: 3 Gy
Reference Point Dosage Given to Date: 3 Gy
Reference Point Dosage Given to Date: 3 Gy
Reference Point Session Dosage Given: 3 Gy
Reference Point Session Dosage Given: 3 Gy
Reference Point Session Dosage Given: 3 Gy
Session Number: 1

## 2024-02-22 NOTE — Progress Notes (Signed)
  [facility]  Diagnostic Nurse Navigator Treatment Team Hand-Off Note  02/22/24  Patient Name:  Antonio Oliver Patient MRN:  [id number] Patient DOB:  1956-06-14   Patient Care Team: Angelique Barer, MD as PCP - General (Internal Medicine) Lei Pump, MD as PCP - Electrophysiology (Cardiology) Audery Blazing Deannie Fabian, MD as PCP - Cardiology (Cardiology) Audery Blazing Deannie Fabian, MD as Consulting Physician (Cardiology) Merriam Abbey, DO as Consulting Physician (Neurology)  Chief Complaint Malignant neoplasm of prostate metastatic to bone  Oncology History  Hormone sensitive prostate cancer [facility])  12/27/2023 Imaging   He underwent CT chest on 12/30/2023 due to history of thoracic aortic aneurysm. Findings showed sclerotic bony lesions are present. The largest is in the T3 vertebral body measuring 1.7 cm.    01/19/2024 Tumor Marker   PSA 99.7   01/27/2024 PET scan   PSMA PET Intense radiotracer accumulation in the anterior and left lateral prostate, consistent with primary prostate carcinoma.   5 mm right internal iliac lymph node with intense radiotracer accumulation, consistent with metastatic disease.   Diffuse sclerotic bone metastases.   Bilateral nephrolithiasis.  No hydronephrosis.   Diffuse colonic distension by large stool burden. Recommend clinical correlation for possible constipation.   02/15/2024 Pathology Results   Bone, biopsy, Left iliac lesion :       -METASTATIC PROSTATE ADENOCARCINOMA (CK AE1/AE3 AND NKX3.1 POSITIVE, WITH      SATISFACTORY CONTROLS.    02/17/2024 Initial Diagnosis   Hormone sensitive prostate cancer [facility])     Cancer Staging  No matching staging information was found for the patient.   SDOH Screening and Interventions Updated:  No  SDOH Screenings   Food Insecurity: No Food Insecurity (02/21/2024)  Housing: High Risk (02/21/2024)  Transportation Needs: No Transportation Needs (02/21/2024)   Utilities: At Risk (02/21/2024)  Depression (PHQ2-9): Low Risk  (02/21/2024)  Financial Resource Strain: Low Risk  (05/30/2019)   Received from [facility]  Social Connections: Unknown (01/06/2023)   Received from [facility]  Tobacco Use: Low Risk  (02/21/2024)     Genetics Assessment Completed:  No Genetics Referral Made:  no  Care Team Updated:  No   Esperanza Hedges, RN, BSN, [facility] Oncology Nurse Navigator, [facility] 02/22/2024 2:50 PM

## 2024-02-23 ENCOUNTER — Ambulatory Visit
Admission: RE | Admit: 2024-02-23 | Discharge: 2024-02-23 | Disposition: A | Discharge status: Home / Self Care | Source: Ambulatory Visit | Attending: Radiation Oncology | Admitting: Radiation Oncology

## 2024-02-23 ENCOUNTER — Other Ambulatory Visit: Payer: Self-pay

## 2024-02-23 DIAGNOSIS — Z510 Encounter for antineoplastic radiation therapy: Secondary | ICD-10-CM | POA: Diagnosis not present

## 2024-02-23 LAB — RAD ONC ARIA SESSION SUMMARY
Course Elapsed Days: 1
Plan Fractions Treated to Date: 2
Plan Fractions Treated to Date: 2
Plan Fractions Treated to Date: 2
Plan Prescribed Dose Per Fraction: 3 Gy
Plan Prescribed Dose Per Fraction: 3 Gy
Plan Prescribed Dose Per Fraction: 3 Gy
Plan Total Fractions Prescribed: 10
Plan Total Fractions Prescribed: 10
Plan Total Fractions Prescribed: 10
Plan Total Prescribed Dose: 30 Gy
Plan Total Prescribed Dose: 30 Gy
Plan Total Prescribed Dose: 30 Gy
Reference Point Dosage Given to Date: 6 Gy
Reference Point Dosage Given to Date: 6 Gy
Reference Point Dosage Given to Date: 6 Gy
Reference Point Session Dosage Given: 3 Gy
Reference Point Session Dosage Given: 3 Gy
Reference Point Session Dosage Given: 3 Gy
Session Number: 2

## 2024-02-24 ENCOUNTER — Other Ambulatory Visit: Payer: Self-pay

## 2024-02-24 ENCOUNTER — Ambulatory Visit

## 2024-02-24 ENCOUNTER — Ambulatory Visit
Admission: RE | Admit: 2024-02-24 | Discharge: 2024-02-24 | Disposition: A | Discharge status: Home / Self Care | Source: Ambulatory Visit | Attending: Radiation Oncology | Admitting: Radiation Oncology

## 2024-02-24 DIAGNOSIS — Z510 Encounter for antineoplastic radiation therapy: Secondary | ICD-10-CM | POA: Diagnosis not present

## 2024-02-24 LAB — RAD ONC ARIA SESSION SUMMARY
Course Elapsed Days: 2
Plan Fractions Treated to Date: 3
Plan Fractions Treated to Date: 3
Plan Fractions Treated to Date: 3
Plan Prescribed Dose Per Fraction: 3 Gy
Plan Prescribed Dose Per Fraction: 3 Gy
Plan Prescribed Dose Per Fraction: 3 Gy
Plan Total Fractions Prescribed: 10
Plan Total Fractions Prescribed: 10
Plan Total Fractions Prescribed: 10
Plan Total Prescribed Dose: 30 Gy
Plan Total Prescribed Dose: 30 Gy
Plan Total Prescribed Dose: 30 Gy
Reference Point Dosage Given to Date: 9 Gy
Reference Point Dosage Given to Date: 9 Gy
Reference Point Dosage Given to Date: 9 Gy
Reference Point Session Dosage Given: 3 Gy
Reference Point Session Dosage Given: 3 Gy
Reference Point Session Dosage Given: 3 Gy
Session Number: 3

## 2024-02-27 ENCOUNTER — Ambulatory Visit
Admission: RE | Admit: 2024-02-27 | Discharge: 2024-02-27 | Disposition: A | Discharge status: Home / Self Care | Source: Ambulatory Visit | Attending: Radiation Oncology | Admitting: Radiation Oncology

## 2024-02-27 ENCOUNTER — Other Ambulatory Visit: Payer: Self-pay

## 2024-02-27 ENCOUNTER — Telehealth: Payer: Self-pay | Admitting: *Deleted

## 2024-02-27 ENCOUNTER — Telehealth: Payer: Self-pay | Admitting: Radiation Oncology

## 2024-02-27 DIAGNOSIS — Z510 Encounter for antineoplastic radiation therapy: Secondary | ICD-10-CM | POA: Diagnosis not present

## 2024-02-27 LAB — RAD ONC ARIA SESSION SUMMARY
Course Elapsed Days: 5
Plan Fractions Treated to Date: 4
Plan Fractions Treated to Date: 4
Plan Fractions Treated to Date: 4
Plan Prescribed Dose Per Fraction: 3 Gy
Plan Prescribed Dose Per Fraction: 3 Gy
Plan Prescribed Dose Per Fraction: 3 Gy
Plan Total Fractions Prescribed: 10
Plan Total Fractions Prescribed: 10
Plan Total Fractions Prescribed: 10
Plan Total Prescribed Dose: 30 Gy
Plan Total Prescribed Dose: 30 Gy
Plan Total Prescribed Dose: 30 Gy
Reference Point Dosage Given to Date: 12 Gy
Reference Point Dosage Given to Date: 12 Gy
Reference Point Dosage Given to Date: 12 Gy
Reference Point Session Dosage Given: 3 Gy
Reference Point Session Dosage Given: 3 Gy
Reference Point Session Dosage Given: 3 Gy
Session Number: 4

## 2024-02-27 NOTE — Telephone Encounter (Signed)
 Wife left a message stating Antonio Oliver is having an issue with his care. States his pain is not being controlled and he is having more pressure in his lower back. Message forwarded to Palliative Care team. PC is asking for input from Dr Tina

## 2024-02-27 NOTE — Telephone Encounter (Signed)
 6/23 @ 3:31 pm patient's wife left voicemail to let someone know they are having car trouble and would like to still come to appt, but wanted to know how long will they wait for them.  Email forward to L4 machine and copied Support RTT, so they are aware.

## 2024-02-28 ENCOUNTER — Ambulatory Visit
Admission: RE | Admit: 2024-02-28 | Discharge: 2024-02-28 | Disposition: A | Discharge status: Home / Self Care | Source: Ambulatory Visit | Attending: Radiation Oncology

## 2024-02-28 ENCOUNTER — Other Ambulatory Visit (HOSPITAL_COMMUNITY): Payer: Self-pay

## 2024-02-28 ENCOUNTER — Inpatient Hospital Stay

## 2024-02-28 ENCOUNTER — Other Ambulatory Visit: Payer: Self-pay

## 2024-02-28 ENCOUNTER — Encounter: Payer: Self-pay | Admitting: Nurse Practitioner

## 2024-02-28 VITALS — BP 93/64 | HR 88 | Temp 98.1°F | Resp 17 | Ht 75.0 in | Wt 202.3 lb

## 2024-02-28 DIAGNOSIS — C61 Malignant neoplasm of prostate: Secondary | ICD-10-CM | POA: Diagnosis not present

## 2024-02-28 DIAGNOSIS — Z515 Encounter for palliative care: Secondary | ICD-10-CM

## 2024-02-28 DIAGNOSIS — C7951 Secondary malignant neoplasm of bone: Secondary | ICD-10-CM

## 2024-02-28 DIAGNOSIS — Z191 Hormone sensitive malignancy status: Secondary | ICD-10-CM

## 2024-02-28 DIAGNOSIS — K5903 Drug induced constipation: Secondary | ICD-10-CM

## 2024-02-28 DIAGNOSIS — G893 Neoplasm related pain (acute) (chronic): Secondary | ICD-10-CM | POA: Diagnosis not present

## 2024-02-28 DIAGNOSIS — R531 Weakness: Secondary | ICD-10-CM

## 2024-02-28 DIAGNOSIS — Z510 Encounter for antineoplastic radiation therapy: Secondary | ICD-10-CM | POA: Diagnosis not present

## 2024-02-28 DIAGNOSIS — R530 Neoplastic (malignant) related fatigue: Secondary | ICD-10-CM

## 2024-02-28 DIAGNOSIS — Z7189 Other specified counseling: Secondary | ICD-10-CM

## 2024-02-28 LAB — RAD ONC ARIA SESSION SUMMARY
Course Elapsed Days: 6
Plan Fractions Treated to Date: 5
Plan Fractions Treated to Date: 5
Plan Fractions Treated to Date: 5
Plan Prescribed Dose Per Fraction: 3 Gy
Plan Prescribed Dose Per Fraction: 3 Gy
Plan Prescribed Dose Per Fraction: 3 Gy
Plan Total Fractions Prescribed: 10
Plan Total Fractions Prescribed: 10
Plan Total Fractions Prescribed: 10
Plan Total Prescribed Dose: 30 Gy
Plan Total Prescribed Dose: 30 Gy
Plan Total Prescribed Dose: 30 Gy
Reference Point Dosage Given to Date: 15 Gy
Reference Point Dosage Given to Date: 15 Gy
Reference Point Dosage Given to Date: 15 Gy
Reference Point Session Dosage Given: 3 Gy
Reference Point Session Dosage Given: 3 Gy
Reference Point Session Dosage Given: 3 Gy
Session Number: 5

## 2024-02-28 MED ORDER — OXYCODONE HCL ER 20 MG PO T12A
20.0000 mg | EXTENDED_RELEASE_TABLET | Freq: Two times a day (BID) | ORAL | 0 refills | Status: DC
  Filled 2024-02-28: qty 30, 15d supply, fill #0

## 2024-02-28 MED ORDER — POLYETHYLENE GLYCOL 3350 17 G PO PACK: 17.0000 g | PACK | Freq: Two times a day (BID) | ORAL | Status: DC

## 2024-02-28 MED ORDER — OXYCODONE-ACETAMINOPHEN 10-325 MG PO TABS
0.5000 | ORAL_TABLET | ORAL | 0 refills | Status: DC | PRN
  Filled 2024-02-28: qty 60, 10d supply, fill #0

## 2024-02-28 NOTE — Progress Notes (Signed)
 Palliative Medicine [facility]  [phone] Fax:([phone]   Name: Antonio Oliver Date: 02/28/2024 MRN: [id number]  DOB: 1955/09/19  Patient Care Team: Jama Chow, MD as PCP - General (Internal Medicine) Inocencio Soyla Lunger, MD as PCP - Electrophysiology (Cardiology) Pietro Redell RAMAN, MD as PCP - Cardiology (Cardiology) Pietro Redell RAMAN, MD as Consulting Physician (Cardiology) Skeet Juliene SAUNDERS, DO as Consulting Physician (Neurology) Vertell Pont, RN as Oncology Nurse Navigator    REASON FOR CONSULTATION: Antonio Oliver is a 68 y.o. male with oncologic medical history including metastatic prostate cancer (02/2024) with newly diffuse sclerotic bone metastasis. Currently undergoing palliative radiation with plans for triple therapy post radiation. Palliative is seeing patient for symptom management and goals of care.    SOCIAL HISTORY:     reports that he has never smoked. He has never used smokeless tobacco. He reports that he does not currently use alcohol. He reports that he does not use drugs.  ADVANCE DIRECTIVES:  None on file. Patient is interested in completing advanced directives. Referral placed to LCSW on 02/28/24.   CODE STATUS: Full code  PAST MEDICAL HISTORY: Past Medical History:  Diagnosis Date   Anxiety and depression    Ascending aorta dilation (HCC) 03/06/2019   4.3 cm 2022   Benign prostatic hyperplasia without lower urinary tract symptoms 11/17/2016   Last Assessment & Plan:  Stable PSA today.  Formatting of this note might be different from the original. Last Assessment & Plan:  Stable PSA today. Last Assessment & Plan:  Formatting of this note might be different from the original. Stable PSA today.   BPH (benign prostatic hyperplasia)    Bradycardia 02/01/2019   Cervical myelopathy (HCC) 11/14/2018   Charcot's joint of foot 03/05/2019   Chronic back pain 04/03/2013   Chronic inflammatory arthritis 11/17/2016   History  of positive rheumatoid factor in the past. Denies placement on immunosuppressive therapy   Chronic pain syndrome    Failed back surgical syndrome.  Chronic neck pain.  Chronic low back pain with sciatica bilateral   Colon cancer screening    2023 cologuard neg   Complete heart block (HCC)    Following AVR s/p St Jude PPM in 03/2017   DM2 (diabetes mellitus, type 2) (HCC) 04/03/2013   Last Assessment & Plan:  Formatting of this note might be different from the original. Diabetes is unchanged.  Continue current treatment regimen. Regular aerobic exercise. Diabetes will be reassessed in 3 months. Will check A1c today. Denies any problems with feet or sensation.   Elevated PSA 11/2021   Enthesopathy of ankle and tarsus 04/04/2013   Last Assessment & Plan:  Continues with swelling after prolonged standing.  Still  Wearing support hose, which helps.   Essential hypertension 10/30/2018   FHx: migraine headaches 04/04/2013   Fracture of capitate bone of wrist 02/26/2019   Fracture of tibia 03/05/2019   GERD (gastroesophageal reflux disease) 10/30/2018   Glaucoma 04/03/2013   Hemangioma of skin and subcutaneous tissue 07/07/2017   History of aortic valve replacement 2018   bicuspid AV   History of kidney stones 11/17/2016   History of sleep apnea    wt loss->gone   Hypothyroidism 10/30/2018   IDA (iron  deficiency anemia) 11/2021   malabs d/t gastric bipass   Lesion of plantar nerve 04/04/2013   Lumbar stenosis with neurogenic claudication 01/08/2021   Major depressive disorder, recurrent (HCC)    ECT in the remote past.  Patient  disabled due to recurrent depression and chronic pain.   Migraine syndrome    Mixed dyslipidemia 10/30/2018   Morbid obesity (HCC) 10/27/2011   Nondisplaced fracture of medial cuneiform of left foot, initial encounter for closed fracture 11/16/2018   Osteoarthritis 10/30/2018   Personal history of tobacco use, presenting hazards to health 04/04/2013   Presence of  permanent cardiac pacemaker    Severe single current episode of major depressive disorder, without psychotic features (HCC) 11/17/2016   Status post bariatric surgery 04/05/2011   bipass surg --presurg wt 310 lbs   Syncope 02/14/2019   Vitamin D  deficiency 10/30/2018    PAST SURGICAL HISTORY:  Past Surgical History:  Procedure Laterality Date   ANTERIOR CERVICAL DECOMP/DISCECTOMY FUSION N/A 11/17/2018   Procedure: CERVICAL THREE-CERVICAL FOUR, CERVICAL FOUR-CERVICAL FIVE, CERVICAL FIVE-CERVICAL SIX ANTERIOR CERVICAL DECOMPRESSION/DISCECTOMY FUSION;  Surgeon: Louis Shove, MD;  Location: [facility] OR;  Service: Neurosurgery;  Laterality: N/A;   AORTIC VALVE REPLACEMENT  03/2017   APPLICATION OF WOUND VAC Left 04/05/2019   Procedure: Application Of Wound Vac;  Surgeon: Elsa Lonni SAUNDERS, MD;  Location: [facility] OR;  Service: Orthopedics;  Laterality: Left;   BACK SURGERY     x4   CARDIOVASCULAR STRESS TEST  05/05/2021   lexiscan  neg   FASCIOTOMY Left 04/05/2019   Left leg 2 compartment fasciotomy   FASCIOTOMY Left 04/05/2019   Procedure: FASCIOTOMY LEFT LOWER LEG;  Surgeon: Elsa Lonni SAUNDERS, MD;  Location: [facility] OR;  Service: Orthopedics;  Laterality: Left;   FOOT NEUROMA SURGERY     GASTRIC BYPASS  2012   HEMATOMA EVACUATION Left 04/05/2019   Procedure: Evacuation Hematoma Left Lower Leg;  Surgeon: Elsa Lonni SAUNDERS, MD;  Location: [facility] OR;  Service: Orthopedics;  Laterality: Left;   HEMORRHOID SURGERY     over 30 years ago   HERNIA REPAIR     LIH umb   I & D EXTREMITY Left 04/13/2022   Procedure: IRRIGATION AND DEBRIDEMENT OF ELBOW AND BURSECTOMY;  Surgeon: Shari Easter, MD;  Location: [facility] OR;  Service: Orthopedics;  Laterality: Left;   LUMBAR LAMINECTOMY/DECOMPRESSION MICRODISCECTOMY N/A 01/08/2021   Procedure: CENTRAL LUMBAR LAMINECTOMY LUMBAR TWO-THREE, LUMBAR THREE-FOUR;  Surgeon: Lucilla Lynwood BRAVO, MD;  Location: [facility] OR;  Service: Orthopedics;  Laterality: N/A;   NASAL SINUS SURGERY     x2    NECK SURGERY     OLECRANON BURSECTOMY  2023   d/t septic bursitis   TRANSESOPHAGEAL ECHOCARDIOGRAM (CATH LAB) N/A 01/02/2024   Procedure: TRANSESOPHAGEAL ECHOCARDIOGRAM;  Surgeon: Francyne Headland, MD;  Location: [facility] INVASIVE CV LAB;  Service: Cardiovascular;  Laterality: N/A;   TRANSTHORACIC ECHOCARDIOGRAM  12/2020   EF normal, mod MR, ascending AA (4.4 cm)    HEMATOLOGY/ONCOLOGY HISTORY:  Oncology History  Hormone sensitive prostate cancer (HCC)  12/27/2023 Imaging   He underwent CT chest on 12/30/2023 due to history of thoracic aortic aneurysm. Findings showed sclerotic bony lesions are present. The largest is in the T3 vertebral body measuring 1.7 cm.    01/19/2024 Tumor Marker   PSA 99.7   01/27/2024 PET scan   PSMA PET Intense radiotracer accumulation in the anterior and left lateral prostate, consistent with primary prostate carcinoma.   5 mm right internal iliac lymph node with intense radiotracer accumulation, consistent with metastatic disease.   Diffuse sclerotic bone metastases.   Bilateral nephrolithiasis.  No hydronephrosis.   Diffuse colonic distension by large stool burden. Recommend clinical correlation for possible constipation.   02/15/2024 Pathology Results   Bone, biopsy, Left  iliac lesion :       -METASTATIC PROSTATE ADENOCARCINOMA (CK AE1/AE3 AND NKX3.1 POSITIVE, WITH      SATISFACTORY CONTROLS.    02/17/2024 Initial Diagnosis   Hormone sensitive prostate cancer (HCC)     ALLERGIES:  is allergic to calcium  carbonate and ms contin [morphine].  MEDICATIONS:  Current Outpatient Medications  Medication Sig Dispense Refill   oxyCODONE  (OXYCONTIN ) 20 mg 12 hr tablet Take 1 tablet (20 mg total) by mouth every 12 (twelve) hours. 30 tablet 0   amoxicillin  (AMOXIL ) 500 MG capsule TAKE 4 CAPSULES BY MOUTH 1 HOUR PRIOR TO DENTAL APPOINTMENT 4 capsule 0   aspirin  EC 81 MG tablet Take 81 mg by mouth daily.     atorvastatin  (LIPITOR) 20 MG tablet Take 20 mg by mouth  daily at 6 PM.      baclofen (LIORESAL) 10 MG tablet Take 10 mg by mouth 3 (three) times daily.     buPROPion  (WELLBUTRIN  SR) 200 MG 12 hr tablet Take 200 mg by mouth 2 (two) times daily.     busPIRone  (BUSPAR ) 10 MG tablet Take 20 mg by mouth 2 (two) times daily.     CALCIUM  CITRATE PO Take 500 mg by mouth 2 (two) times daily.     Cholecalciferol  (VITAMIN D -3 PO) Take 5,000 capsules by mouth daily.     clindamycin (CLEOCIN) 300 MG capsule Take 300 mg by mouth 3 (three) times daily.     clobetasol (TEMOVATE) 0.05 % external solution Apply 1 Application topically as needed.     clonazePAM  (KLONOPIN ) 0.5 MG tablet Take 0.5 mg by mouth 2 (two) times daily as needed for anxiety.     darolutamide  (NUBEQA ) 300 MG tablet Take 2 tablets (600 mg total) by mouth 2 (two) times daily with a meal. 120 tablet 11   diclofenac Sodium (VOLTAREN) 1 % GEL Apply 1 application  topically as needed.     dicyclomine (BENTYL) 20 MG tablet Take 20 mg by mouth 4 (four) times daily as needed.     docusate sodium  (COLACE) 100 MG capsule Take 1 capsule (100 mg total) by mouth 2 (two) times daily. 10 capsule 0   DULoxetine  (CYMBALTA ) 60 MG capsule Take 60 mg by mouth 2 (two) times daily.      ferrous sulfate  325 (65 FE) MG EC tablet Take 325 mg by mouth daily.     finasteride  (PROSCAR ) 5 MG tablet Take 5 mg by mouth daily.     fluticasone  (FLONASE ) 50 MCG/ACT nasal spray Place 2 sprays into the nose daily as needed for allergies or rhinitis.     folic acid  (FOLVITE ) 1 MG tablet Take 1 mg by mouth daily.     furosemide  (LASIX ) 20 MG tablet Take 1 tablet (20 mg total) by mouth as needed (for leg swelling). 30 tablet 4   Galcanezumab -gnlm (EMGALITY ) 120 MG/ML SOAJ Inject 120 mg into the skin every 28 (twenty-eight) days. 1 mL 5   hydrALAZINE  (APRESOLINE ) 10 MG tablet Take 1 tablet BY MOUTH three times daily for systolic bp > 140 or diastolic bp > 90 729 tablet 3   insulin  lispro (HUMALOG) 100 UNIT/ML injection as directed  Injection as needed     iron  sucrose 200 mg in sodium chloride  0.9 % 100 mL Inject 200 mg into the vein once a week.     levothyroxine  (SYNTHROID ) 125 MCG tablet Take 125 mcg by mouth daily.      lidocaine  (LIDODERM ) 5 % as needed.  loratadine  (CLARITIN ) 10 MG tablet Take 10 mg by mouth daily as needed for allergies.     Magnesium  250 MG TABS 2 tablets (500mg ) Orally Twice a day     MAGNESIUM  PO Take 500 mg by mouth in the morning and at bedtime.     meclizine (ANTIVERT) 25 MG tablet Take 25 mg by mouth 3 (three) times daily as needed for dizziness.     metFORMIN  (GLUCOPHAGE ) 1000 MG tablet Take 1,000 mg by mouth 2 (two) times daily with a meal.     mexiletine (MEXITIL) 250 MG capsule Take 1 capsule (250 mg total) by mouth 2 (two) times daily. 60 capsule 3   mirtazapine  (REMERON ) 30 MG tablet 1 tablet at bedtime Orally Once a day     Multiple Vitamins-Minerals (MULTIVITAMIN WITH MINERALS) tablet Take 1 tablet by mouth daily.     Multiple Vitamins-Minerals (PRESERVISION/LUTEIN) CAPS Take 1 capsule by mouth at bedtime.     mupirocin  ointment (BACTROBAN ) 2 % Apply 1 application topically 3 (three) times daily as needed (infection).     naloxone  (NARCAN ) nasal spray 4 mg/0.1 mL Instill or spray into the nose if you are experiencing shortness of breath or signs of opioid overdose. 1 each 0   omeprazole (PRILOSEC) 20 MG capsule Take 20 mg by mouth 2 (two) times daily before a meal.      oxyCODONE -acetaminophen  (PERCOCET) 10-325 MG tablet Take 0.5-1 tablets by mouth every 4 (four) hours as needed for pain. 60 tablet 0   polyethylene glycol (MIRALAX  / GLYCOLAX ) 17 g packet Take 17 g by mouth 2 (two) times daily.     potassium chloride  (KLOR-CON ) 10 MEQ tablet Take 10 mEq by mouth as needed (with lasix  dose).     potassium chloride  SA (KLOR-CON  M) 20 MEQ tablet Take 20 mEq by mouth as needed.     pregabalin  (LYRICA ) 100 MG capsule Take 100 mg by mouth 3 (three) times daily.     prochlorperazine  (COMPAZINE) 5 MG tablet Take 1 tablet (5 mg total) by mouth every 6 (six) hours as needed for nausea or vomiting. 30 tablet 0   pseudoephedrine (SUDAFED) 30 MG tablet Take 30 mg by mouth every 6 (six) hours as needed.     relugolix  (ORGOVYX ) 120 MG tablet Take 3 tablets (360 mg total) by mouth on day 1, then take 1 tablet (120 mg total) daily thereafter. 30 tablet 0   Semaglutide ,0.25 or 0.5MG /DOS, (OZEMPIC , 0.25 OR 0.5 MG/DOSE,) 2 MG/3ML SOPN Inject 0.5 mg into the skin once a week. 3 mL 3   silodosin (RAPAFLO) 8 MG CAPS capsule Take 8 mg by mouth daily.     solifenacin (VESICARE) 10 MG tablet Take 10 mg by mouth at bedtime.     tiZANidine  (ZANAFLEX ) 4 MG tablet Take 4 mg by mouth 3 (three) times daily.     traMADol  (ULTRAM ) 50 MG tablet Take 50 mg by mouth 3 (three) times daily.     Ubrogepant  (UBRELVY ) 100 MG TABS Take 1 tablet (100 mg total) by mouth as needed (May repeat in 2 hours.  Maximum 2 tablets in 24 hours.). 48 tablet 3   No current facility-administered medications for this visit.    VITAL SIGNS: BP 93/64 (BP Location: Left Arm, Patient Position: Sitting)   Pulse 88   Temp 98.1 F (36.7 C) (Temporal)   Resp 17   Ht 6' 3 (1.905 m)   Wt 202 lb 4.8 oz (91.8 kg)   SpO2 97%  BMI 25.29 kg/m  Filed Weights   02/28/24 1112  Weight: 202 lb 4.8 oz (91.8 kg)    Estimated body mass index is 25.29 kg/m as calculated from the following:   Height as of this encounter: 6' 3 (1.905 m).   Weight as of this encounter: 202 lb 4.8 oz (91.8 kg).  LABS: CBC:    Component Value Date/Time   WBC 4.6 02/21/2024 1805   HGB 10.5 (L) 02/21/2024 1805   HGB 10.2 (L) 01/19/2024 1415   HGB 9.9 (L) 12/27/2023 1619   HCT 32.8 (L) 02/21/2024 1805   HCT 32.6 (L) 12/27/2023 1619   PLT 172 02/21/2024 1805   PLT 252 01/19/2024 1415   PLT 242 12/27/2023 1619   MCV 82.8 02/21/2024 1805   MCV 80 12/27/2023 1619   NEUTROABS 2.0 02/21/2024 1805   LYMPHSABS 1.5 02/21/2024 1805   MONOABS 0.5  02/21/2024 1805   EOSABS 0.6 (H) 02/21/2024 1805   BASOSABS 0.1 02/21/2024 1805   Comprehensive Metabolic Panel:    Component Value Date/Time   NA 139 02/21/2024 1805   NA 139 12/27/2023 1619   K 4.5 02/21/2024 1805   CL 105 02/21/2024 1805   CO2 24 02/21/2024 1805   BUN 19 02/21/2024 1805   BUN 15 12/27/2023 1619   CREATININE 0.99 02/21/2024 1805   CREATININE 1.02 01/19/2024 1415   CREATININE 1.05 07/28/2011 1630   GLUCOSE 83 02/21/2024 1805   CALCIUM  9.2 02/21/2024 1805   AST 14 (L) 01/19/2024 1415   ALT 17 01/19/2024 1415   ALKPHOS 92 01/19/2024 1415   BILITOT 0.4 01/19/2024 1415   PROT 6.9 01/19/2024 1415   PROT 6.6 07/12/2019 0910   ALBUMIN  4.4 01/19/2024 1415   ALBUMIN  4.3 07/12/2019 0910    RADIOGRAPHIC STUDIES: CT PELVIS W CONTRAST Result Date: 02/21/2024 CLINICAL DATA:  Hematoma post biopsy EXAM: CT PELVIS WITH CONTRAST TECHNIQUE: Multidetector CT imaging of the pelvis was performed using the standard protocol following the bolus administration of intravenous contrast. RADIATION DOSE REDUCTION: This exam was performed according to the departmental dose-optimization program which includes automated exposure control, adjustment of the mA and/or kV according to patient size and/or use of iterative reconstruction technique. CONTRAST:  OMNIPAQUE  IOHEXOL  300 MG/ML  SOLN COMPARISON:  CT 02/15/2024, PET CT 01/27/2024 FINDINGS: Urinary Tract: Imaged adrenal glands are within normal limits. Multiple left kidney stones, including large renal pelvis stone measuring up to 26 mm. There is mild hydronephrosis of the upper pole of the left kidney. Small nonobstructing right kidney stones. Urinary bladder is unremarkable. Bowel:  No acute bowel wall thickening.  Moderate stool in the colon Vascular/Lymphatic: Aortic atherosclerosis. No aneurysm. No suspicious lymph nodes. Reproductive:  Negative prostate.  Bilateral scrotal hydroceles Other:  Negative for pelvic effusion or free air  Musculoskeletal: Multiple sclerotic lesions within the spine and pelvis suspicious for metastatic disease. Lumbar fusion hardware L4 through S1 with dominant sclerotic lesion in the L4 vertebral body. No acute fracture is seen. Interim development of fluid within the subcutaneous soft tissues of the lower back. Hyperdense collection within the subcutaneous soft tissues of the left posterior paraspinal region, this measures approximately 8.8 by 2.3 by 12.6 cm and would be consistent with a soft tissue hematoma. Trace focus of hyperdensity within the hematoma, series 2, image 26, with fairly extensive artifact from adjacent lumbar spine hardware. IMPRESSION: 1. Interim development of fluid within the subcutaneous soft tissues of the lower back. Hyperdense collection within the subcutaneous soft tissues of the  left posterior paraspinal region measuring up to 12.6 cm consistent with a soft tissue hematoma. Trace focus of hyperdensity within the hematoma, could be related to artifact from adjacent lumbar spine hardware, with trace focus of extravasation not excluded. 2. Multiple sclerotic lesions within the spine and pelvis suspicious for metastatic disease. 3. Multiple left kidney stones, including large renal pelvis stone measuring up to 26 mm. There is mild hydronephrosis of the upper pole of the left kidney. Small nonobstructing right kidney stones. 4. Bilateral scrotal hydroceles. 5. Aortic atherosclerosis. Aortic Atherosclerosis (ICD10-I70.0). Electronically Signed   By: Luke Bun M.D.   On: 02/21/2024 19:22   CT BONE TROCAR/NEEDLE BIOPSY DEEP Result Date: 02/15/2024 INDICATION: 68 year old male with a history of prostate cancer and sclerotic lesions with hypermetabolic activity on PET-CT. He presents for CT-guided biopsy. EXAM: CT-guided bone biopsy MEDICATIONS: None. ANESTHESIA/SEDATION: 1 mg Versed  administered for anxiolysis. COMPLICATIONS: None immediate. PROCEDURE: Informed written consent was obtained  from the patient after a thorough discussion of the procedural risks, benefits and alternatives. All questions were addressed. Maximal Sterile Barrier Technique was utilized including caps, mask, sterile gowns, sterile gloves, sterile drape, hand hygiene and skin antiseptic. A timeout was performed prior to the initiation of the procedure. A planning CT scan was performed. The subtle sclerotic lesion with associated hypermetabolic activity in the left iliac bone is identified. A suitable skin entry site is selected and marked. Local anesthesia is attained by infiltration with 1% lidocaine . A small dermatotomy is made. Under intermittent CT guidance, the trocar device was advanced through the cortex and marrow space and positioned at the margin of the mass. The on control drill was utilized. Next, the coaxial biopsy system was used to obtain 2 core specimens from the lesion. Images were documented with CT imaging. The biopsy devices were removed. Hemostasis was attained by manual pressure. IMPRESSION: Successful CT-guided biopsy of left iliac sclerotic lesion. Electronically Signed   By: Wilkie Lent M.D.   On: 02/15/2024 12:10    PERFORMANCE STATUS (ECOG) : 2 - Symptomatic, <50% confined to bed  Review of Systems  Constitutional:  Positive for activity change, appetite change and fatigue.  Musculoskeletal:  Positive for arthralgias and back pain.  Unless otherwise noted, a complete review of systems is negative.  Physical Exam General: NAD Cardiovascular: regular rate and rhythm Pulmonary: clear ant fields Abdomen: soft, nontender, + bowel sounds Extremities: no edema, no joint deformities Skin: no rashes Neurological: Alert and oriented x3  IMPRESSION: Discussed the use of AI scribe software for clinical note transcription with the patient, who gave verbal consent to proceed.  History of Present Illness SETH HIGGINBOTHAM is a 67 year old male with newly diagnosed metastatic prostate cancer  with diffuse bone mets. He presents to clinic today for initial palliative visit. He is accompanied by his wife, [name]. Patient is in wheelchair. Awake and alert. Able to engage in discussions appropriately. Hypotensive however wife states this is patient's norm depending on situation. He has  autonomic dysfunction and is followed closely by Cardiology with no plans for additional interventions unless becomes symptomatic.   I introduced myself, Maygan RN, and Palliative's role in collaboration with the oncology team. Concept of Palliative Care was introduced as specialized medical care for people and their families living with serious illness.  It focuses on providing relief from the symptoms and stress of a serious illness.  The goal is to improve quality of life for both the patient and the family. Values and goals of  care important to patient and family were attempted to be elicited.   Mr. Mcbain lives in the home with his wife of more than 44 years. They have one daughter. 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   He has a history of autonomic dysfunction, causing his blood pressure to fluctuate from very low to high. Recently, his blood pressure has been predominantly low, which may be exacerbated by fentanyl  use. He also experiences constipation, which has worsened with his dysautonomia. He uses Miralax  and Docusate for management, but constipation remains a significant issue. Denies consistent use. Recommended Miralax  twice daily with Senna-S 2 tablets at bedtime. He has experienced nausea occasionally, particularly on the day a hematoma developed following a biopsy. He uses prochlorperazine for nausea management.  Wife shares over the past three to four weeks, he has experienced a significant decline in mobility, with worsening in the last week. He has had four episodes of sudden weakness where he cannot move his legs or stand alone, and  he does not remember these episodes. The first episode occurred in May while on vacation, with subsequent episodes on June 15th, 20th, and 23rd. During these episodes, he experiences an inability to feel his legs and requires assistance to move. He also has difficulty standing and balancing. Recent incident while dining required assistance from 3 bystanders to get patient up and in the car. Additional support required once they arrived home.   Mr. Capaldi pain has increased despite escalating doses of fentanyl  patches, currently at 50 mcg, which he has been using for about ten days. His pain level remains over ten, and the fentanyl  is not providing relief. Prior to fentanyl , his pain score was between seven to nine. He has a history of not absorbing fentanyl  effectively, as evidenced by low blood levels in the past. Wife shares patient in past years tried fentanyl  and when he showed no signs of pain relief or decrease provider drew quantitative blood work to analyze symptoms. Results ultimately did not show Fentanyl  levels sufficient to support use of patch demonstrating malabsorption.   In May 2023, he experienced extreme damage to his neck and lower back, resulting in chronic pain s/p fall. He has undergone multiple surgeries, including fusions in his neck and lower back. His pain management has included various medications over the years, and he has been on Percocet intermittently for about 10 years. Although never consistently taken long-acting regimen.   Due to recent increase in pain patient was instructed to take Percocet, with recent instructions to increase from half to a whole tablet, but he has not noticed significant pain relief. He does acknowledge only taking 3 doses of full tablet.   Complete physical, medication, medical, and psychosocial review completed.  Extensive discussions and explanation of palliative's role in collaboration with his oncology team to assist in his pain and symptom  management. Patient and wife understands realistically we may not be able to resolve pain or get to a zero-one level however the goal is to decrease and gain some comfort with controlled pain allowing for improved quality of life.   I recommended discontinuing fentanyl  patch given no improvement or noticeable difference in pain level despite escalation from 12-50mcg over the past 3-4 weeks. Will start Oxycontin  20mg  every 12 hours in addition to Percoce 10-325mg  every 4 hours as needed. Education provided on regimen, use, efficacy, and potential side effects.   All questions answered and support provided.  Goals of Care  We discussed his current illness and what it means in the larger context of his on-going co-morbidities. Natural disease trajectory and expectations were discussed. Mr. Pettet and his wife are realistic in their understanding of patient's extensive cancer diagnosis and plan of care.   I empathetically approached discussions regarding code status, advanced directives, and MOST form. They verbalized understanding and appreciation. Mr. Gilardi does not have a documented advanced directive however he would like to complete documents. MOST form given to wife for them to review at home in addition to advanced directive packet. He would like to complete directives. Referral placed.   Mr. Fauth is clear in expressed wishes to continue to treat the treatable allowing him every opportunity to thrive while aggressively managing symptoms to minimize discomfort and suffering. His quality of life is most important.   I discussed the importance of continued conversation with family and their medical providers regarding overall plan of care and treatment options, ensuring decisions are within the context of the patients values and GOCs. Assessment & Plan Autonomic dysfunction with hypotension Autonomic dysfunction causing significant blood pressure fluctuations, challenging management due to  variability. - Monitor blood pressure closely. - Continue PRN hydralazine  for hypertension per Cardiologist.   Chronic pain due to neck and back issues now with cancer related pain due to diffuse bone mets.  Pain inadequately managed with fentanyl  patch. Transitioning to OxyContin  for better control. Education provided on use.  - Discontinue fentanyl  patch. - Initiate OxyContin  20 mg every 12 hours. - Continue oxycodone  10/325 mg PRN for breakthrough pain, dosing half to one tablet every 4-6 hours. - Monitor for adverse reactions to OxyContin . - Educated on potential side effects of OxyContin , including sedation and hypotension.  Neck and back surgeries Multiple surgeries contributing to chronic pain and mobility issues.  Constipation due to dysautonomia Significant constipation exacerbated by dysautonomia and opioid use. Current regimen effectiveness decreased. - Increase Miralax  to twice daily. - Continue Docusate. - Reassess constipation management in 1-2 weeks.  Radiation therapy for cancer Undergoing radiation therapy with last treatment scheduled for July 1. - Coordinate with radiation team to adjust schedule if needed. - Discuss any new symptoms with radiation team.  Goals of Care Discussed advanced directives and healthcare power of attorney. Emphasized importance of documenting wishes regarding life-sustaining treatments. - Complete advanced care directives and MOST form with social work team and at follow-up.   I will plan to see patient back in 2-3 weeks. Sooner if needed.   Patient expressed understanding and was in agreement with this plan. He also understands that He can call the clinic at any time with any questions, concerns, or complaints.   Thank you for your referral and allowing Palliative to assist in Mr. Ke[facility] care.   Number and complexity of problems addressed: HIGH - 1 or more chronic illnesses with SEVERE exacerbation, progression, or side  effects of treatment - advanced cancer, pain. Any controlled substances utilized were prescribed in the context of palliative care.  Visit consisted of counseling and education dealing with the complex and emotionally intense issues of symptom management and palliative care in the setting of serious and potentially life-threatening illness.  Signed by: Levon Borer, AGPCNP-BC Palliative Medicine Team/[facility]

## 2024-02-28 NOTE — Patient Instructions (Signed)
 VISIT SUMMARY:  Today, we discussed your recent episodes of weakness and increased pain. We reviewed your current medications and made adjustments to better manage your chronic pain and constipation. We also talked about your blood pressure fluctuations and the importance of monitoring it closely. Additionally, we discussed the importance of completing your advanced care directives.  YOUR PLAN:  -AUTONOMIC DYSFUNCTION WITH HYPOTENSION: Autonomic dysfunction is a condition where the autonomic nervous system does not work properly, causing blood pressure fluctuations. We will monitor your blood pressure closely and continue using hydralazine  as needed for high blood pressure.  -CHRONIC PAIN DUE TO NECK AND BACK ISSUES: Chronic pain is long-lasting pain that can be difficult to manage. We will discontinue the fentanyl  patch and start you on OxyContin  20 mg every 12 hours for better pain control. You should continue taking oxycodone  10/325 mg as needed for breakthrough pain, with a dose of half to one tablet every 4-6 hours. Please monitor for any signs of over-sedation or adverse reactions.  -CONSTIPATION DUE TO DYSAUTONOMIA: Constipation is difficulty in passing stools, which can be worsened by dysautonomia and opioid use. We will increase your Miralax  to twice daily and continue Docusate. We will reassess your constipation management in 1-2 weeks.  -RADIATION THERAPY FOR CANCER: Radiation therapy is a treatment for cancer that uses high doses of radiation to kill cancer cells. Your last treatment is scheduled for July 1. Please coordinate with your radiation team if any schedule adjustments are needed and discuss any new symptoms with them.  -GOALS OF CARE: Goals of care involve planning for future healthcare decisions. We discussed the importance of completing your advanced care directives and MOST form. Please schedule an appointment with the social work team to complete these documents and have them  notarized.  INSTRUCTIONS:  Please monitor your blood pressure closely and report any significant changes. Follow the new pain management plan and watch for any side effects. Increase your Miralax  to twice daily and continue Docusate for constipation. Coordinate with your radiation team if any schedule adjustments are needed. Schedule an appointment with the social work team to complete your advanced care directives and MOST form.

## 2024-02-29 ENCOUNTER — Ambulatory Visit
Admission: RE | Admit: 2024-02-29 | Discharge: 2024-02-29 | Disposition: A | Discharge status: Home / Self Care | Source: Ambulatory Visit | Attending: Radiation Oncology | Admitting: Radiation Oncology

## 2024-02-29 ENCOUNTER — Other Ambulatory Visit: Payer: Self-pay

## 2024-02-29 ENCOUNTER — Inpatient Hospital Stay

## 2024-02-29 DIAGNOSIS — Z510 Encounter for antineoplastic radiation therapy: Secondary | ICD-10-CM | POA: Diagnosis not present

## 2024-02-29 LAB — RAD ONC ARIA SESSION SUMMARY
Course Elapsed Days: 7
Plan Fractions Treated to Date: 6
Plan Fractions Treated to Date: 6
Plan Fractions Treated to Date: 6
Plan Prescribed Dose Per Fraction: 3 Gy
Plan Prescribed Dose Per Fraction: 3 Gy
Plan Prescribed Dose Per Fraction: 3 Gy
Plan Total Fractions Prescribed: 10
Plan Total Fractions Prescribed: 10
Plan Total Fractions Prescribed: 10
Plan Total Prescribed Dose: 30 Gy
Plan Total Prescribed Dose: 30 Gy
Plan Total Prescribed Dose: 30 Gy
Reference Point Dosage Given to Date: 18 Gy
Reference Point Dosage Given to Date: 18 Gy
Reference Point Dosage Given to Date: 18 Gy
Reference Point Session Dosage Given: 3 Gy
Reference Point Session Dosage Given: 3 Gy
Reference Point Session Dosage Given: 3 Gy
Session Number: 6

## 2024-02-29 NOTE — Progress Notes (Signed)
 [facility] Clinical Social Work  Clinical Social Work was referred by Palliative Care  for assessment of psychosocial needs.  Clinical Social Worker contacted caregiver by phone to offer support and assess for needs. Patient's spouse reported completing advance directives and in home support as her primary need.   Interventions CSW Provided education on advance directives and how to obtain one through [facility]. Patient unable to make extra trip to [facility] for clinic. CSW provided education on different in home services and how to obtain them. CSW assisted with care coordination by relaying message on pain to palliative team members. CSW placed referrals on patient's behalf to [facility] and [facility]  Next steps  Appointment scheduled for 6/30  Lizbeth Sprague, [place]  Clinical Social Worker [facility]

## 2024-02-29 NOTE — Addendum Note (Signed)
 Addended by: PICKENPACK-COUSAR, Arie Gable N on: 02/29/2024 12:03 PM   Modules accepted: Orders

## 2024-03-01 ENCOUNTER — Ambulatory Visit

## 2024-03-01 ENCOUNTER — Ambulatory Visit
Admission: RE | Admit: 2024-03-01 | Discharge: 2024-03-01 | Disposition: A | Discharge status: Home / Self Care | Source: Ambulatory Visit | Attending: Radiation Oncology | Admitting: Radiation Oncology

## 2024-03-01 ENCOUNTER — Other Ambulatory Visit: Payer: Self-pay

## 2024-03-01 DIAGNOSIS — Z510 Encounter for antineoplastic radiation therapy: Secondary | ICD-10-CM | POA: Diagnosis not present

## 2024-03-01 LAB — RAD ONC ARIA SESSION SUMMARY
Course Elapsed Days: 8
Plan Fractions Treated to Date: 7
Plan Fractions Treated to Date: 7
Plan Fractions Treated to Date: 7
Plan Prescribed Dose Per Fraction: 3 Gy
Plan Prescribed Dose Per Fraction: 3 Gy
Plan Prescribed Dose Per Fraction: 3 Gy
Plan Total Fractions Prescribed: 10
Plan Total Fractions Prescribed: 10
Plan Total Fractions Prescribed: 10
Plan Total Prescribed Dose: 30 Gy
Plan Total Prescribed Dose: 30 Gy
Plan Total Prescribed Dose: 30 Gy
Reference Point Dosage Given to Date: 21 Gy
Reference Point Dosage Given to Date: 21 Gy
Reference Point Dosage Given to Date: 21 Gy
Reference Point Session Dosage Given: 3 Gy
Reference Point Session Dosage Given: 3 Gy
Reference Point Session Dosage Given: 3 Gy
Session Number: 7

## 2024-03-02 ENCOUNTER — Ambulatory Visit
Admission: RE | Admit: 2024-03-02 | Discharge: 2024-03-02 | Disposition: A | Discharge status: Home / Self Care | Source: Ambulatory Visit | Attending: Radiation Oncology | Admitting: Radiation Oncology

## 2024-03-02 ENCOUNTER — Ambulatory Visit
Admission: RE | Admit: 2024-03-02 | Discharge: 2024-03-02 | Disposition: A | Discharge status: Home / Self Care | Source: Ambulatory Visit | Attending: Radiation Oncology

## 2024-03-02 ENCOUNTER — Other Ambulatory Visit: Payer: Self-pay | Admitting: Radiation Oncology

## 2024-03-02 ENCOUNTER — Other Ambulatory Visit: Payer: Self-pay

## 2024-03-02 DIAGNOSIS — Z510 Encounter for antineoplastic radiation therapy: Secondary | ICD-10-CM | POA: Diagnosis not present

## 2024-03-02 LAB — RAD ONC ARIA SESSION SUMMARY
Course Elapsed Days: 9
Plan Fractions Treated to Date: 8
Plan Fractions Treated to Date: 8
Plan Fractions Treated to Date: 8
Plan Prescribed Dose Per Fraction: 3 Gy
Plan Prescribed Dose Per Fraction: 3 Gy
Plan Prescribed Dose Per Fraction: 3 Gy
Plan Total Fractions Prescribed: 10
Plan Total Fractions Prescribed: 10
Plan Total Fractions Prescribed: 10
Plan Total Prescribed Dose: 30 Gy
Plan Total Prescribed Dose: 30 Gy
Plan Total Prescribed Dose: 30 Gy
Reference Point Dosage Given to Date: 24 Gy
Reference Point Dosage Given to Date: 24 Gy
Reference Point Dosage Given to Date: 24 Gy
Reference Point Session Dosage Given: 3 Gy
Reference Point Session Dosage Given: 3 Gy
Reference Point Session Dosage Given: 3 Gy
Session Number: 8

## 2024-03-02 MED ORDER — FLUCONAZOLE 100 MG PO TABS: 100.0000 mg | ORAL_TABLET | Freq: Every day | ORAL | 0 refills | Status: DC

## 2024-03-02 NOTE — Assessment & Plan Note (Addendum)
 On atorvastatin .  Monitor for toxicity with daro

## 2024-03-02 NOTE — Assessment & Plan Note (Addendum)
 Started darolutamide  600 mg twice daily. Will continue  continue relugolix  Will complete palliative radiation on 7/1

## 2024-03-02 NOTE — Assessment & Plan Note (Signed)
 Saw palliative care. Switched to OxyContin  20 mg every 12 hours. Continue oxycodone  10/325 mg PRN  Completing radiation

## 2024-03-02 NOTE — Assessment & Plan Note (Signed)
 Previous gastric bypass surgery Received IV iron  Venofer  x4 from May to June Will repeat in Aug

## 2024-03-02 NOTE — Progress Notes (Unsigned)
 [facility] OFFICE PROGRESS NOTE  Patient Care Team: Jama Chow, MD as PCP - General (Internal Medicine) Inocencio Soyla Lunger, MD as PCP - Electrophysiology (Cardiology) Pietro Redell RAMAN, MD as PCP - Cardiology (Cardiology) Pietro Redell RAMAN, MD as Consulting Physician (Cardiology) Skeet Juliene SAUNDERS, DO as Consulting Physician (Neurology) Vertell Pont, RN as Oncology Nurse Navigator Pickenpack-Cousar, Fannie SAILOR, NP as Nurse Practitioner (Hospice and Palliative Medicine)  Antonio Oliver is a 68 y.o.male with history of gastric bypass, pacemaker, aortic valve replacement and thoracic aortic aneurysm, HTN, HLD, DM2, Obesity, arthritis being seen at [facility] for prostate cancer.   Current diagnosis: mHSPC Initial diagnosis: mHSPC with bone and LN metastses Germline testing: will refer Somatic testing: not yet Treatment: at diagnosis, palliative radiation to painful osseous right rib, lumbar and thoracic spine metastatic disease.   Start date for both Nubeqa  and Orgovyx : 02/23/24 or 02/24/24    The patient was counseled on the natural history of prostate cancer and the standard treatment options that are available for prostate cancer.    Discussed ARASENS trial.   Assessment & Plan Hormone sensitive prostate cancer (HCC) Started darolutamide  600 mg twice daily. Will continue  continue relugolix  Will complete palliative radiation on 7/1 Mixed dyslipidemia On atorvastatin .  Monitor for toxicity with daro  No orders of the defined types were placed in this encounter.    [name] Chihuahua, MD  INTERVAL HISTORY: Patient returns for follow-up.  Oncology History  Hormone sensitive prostate cancer (HCC)  12/27/2023 Imaging   He underwent CT chest on 12/30/2023 due to history of thoracic aortic aneurysm. Findings showed sclerotic bony lesions are present. The largest is in the T3 vertebral body measuring 1.7 cm.    01/19/2024 Tumor Marker   PSA 99.7   01/27/2024 PET scan    PSMA PET Intense radiotracer accumulation in the anterior and left lateral prostate, consistent with primary prostate carcinoma.   5 mm right internal iliac lymph node with intense radiotracer accumulation, consistent with metastatic disease.   Diffuse sclerotic bone metastases.   Bilateral nephrolithiasis.  No hydronephrosis.   Diffuse colonic distension by large stool burden. Recommend clinical correlation for possible constipation.   02/15/2024 Pathology Results   Bone, biopsy, Left iliac lesion :       -METASTATIC PROSTATE ADENOCARCINOMA (CK AE1/AE3 AND NKX3.1 POSITIVE, WITH      SATISFACTORY CONTROLS.    02/17/2024 Initial Diagnosis   Hormone sensitive prostate cancer (HCC)      PHYSICAL EXAMINATION: ECOG PERFORMANCE STATUS: {CHL ONC ECOG PS:[phone]}  There were no vitals filed for this visit. There were no vitals filed for this visit.  GENERAL: alert, no distress and comfortable SKIN: skin color normal and no bruising or petechiae or jaundice on exposed skin EYES: normal, sclera clear OROPHARYNX: no exudate  NECK: No palpable mass LYMPH:  no palpable cervical, axillary lymphadenopathy  LUNGS: clear to auscultation and percussion with normal breathing effort HEART: regular rate & rhythm  ABDOMEN: abdomen soft, non-tender and nondistended. Musculoskeletal: no edema NEURO: no focal motor/sensory deficits  Relevant data reviewed during this visit included ***

## 2024-03-02 NOTE — Assessment & Plan Note (Signed)
  Supportive baseline bone mineral density study in the future calcium  (1000-1200 mg daily from food and supplements) and vitamin D3 (1000 IU daily) Healthy lifestyle to prevent diabetes and CV disease Aggressive cardiovascular risk management Limit alcohol consumption and avoid smoking

## 2024-03-05 ENCOUNTER — Other Ambulatory Visit: Payer: Self-pay

## 2024-03-05 ENCOUNTER — Inpatient Hospital Stay

## 2024-03-05 ENCOUNTER — Other Ambulatory Visit (HOSPITAL_COMMUNITY): Payer: Self-pay

## 2024-03-05 ENCOUNTER — Ambulatory Visit
Admission: RE | Admit: 2024-03-05 | Discharge: 2024-03-05 | Disposition: A | Discharge status: Home / Self Care | Source: Ambulatory Visit | Attending: Radiation Oncology | Admitting: Radiation Oncology

## 2024-03-05 VITALS — BP 118/84 | HR 79 | Temp 98.4°F | Resp 17 | Ht 75.0 in | Wt 203.0 lb

## 2024-03-05 DIAGNOSIS — C61 Malignant neoplasm of prostate: Secondary | ICD-10-CM

## 2024-03-05 DIAGNOSIS — Z191 Hormone sensitive malignancy status: Secondary | ICD-10-CM

## 2024-03-05 DIAGNOSIS — Z510 Encounter for antineoplastic radiation therapy: Secondary | ICD-10-CM | POA: Diagnosis not present

## 2024-03-05 DIAGNOSIS — D508 Other iron deficiency anemias: Secondary | ICD-10-CM | POA: Diagnosis not present

## 2024-03-05 DIAGNOSIS — E782 Mixed hyperlipidemia: Secondary | ICD-10-CM | POA: Diagnosis not present

## 2024-03-05 DIAGNOSIS — T148XXA Other injury of unspecified body region, initial encounter: Secondary | ICD-10-CM | POA: Insufficient documentation

## 2024-03-05 DIAGNOSIS — G893 Neoplasm related pain (acute) (chronic): Secondary | ICD-10-CM

## 2024-03-05 DIAGNOSIS — Z9189 Other specified personal risk factors, not elsewhere classified: Secondary | ICD-10-CM | POA: Diagnosis not present

## 2024-03-05 DIAGNOSIS — Z9884 Bariatric surgery status: Secondary | ICD-10-CM

## 2024-03-05 DIAGNOSIS — K1379 Other lesions of oral mucosa: Secondary | ICD-10-CM

## 2024-03-05 DIAGNOSIS — D649 Anemia, unspecified: Secondary | ICD-10-CM

## 2024-03-05 LAB — CMP (CANCER CENTER ONLY)
ALT: 11 U/L (ref 0–44)
AST: 13 U/L — ABNORMAL LOW (ref 15–41)
Albumin: 3.7 g/dL (ref 3.5–5.0)
Alkaline Phosphatase: 83 U/L (ref 38–126)
Anion gap: 6 (ref 5–15)
BUN: 16 mg/dL (ref 8–23)
CO2: 27 mmol/L (ref 22–32)
Calcium: 9 mg/dL (ref 8.9–10.3)
Chloride: 105 mmol/L (ref 98–111)
Creatinine: 0.86 mg/dL (ref 0.61–1.24)
GFR, Estimated: >60 mL/min (ref >60)
Glucose, Bld: 116 mg/dL — ABNORMAL HIGH (ref 70–99)
Potassium: 4.4 mmol/L (ref 3.5–5.1)
Sodium: 138 mmol/L (ref 135–145)
Total Bilirubin: 0.5 mg/dL (ref 0.0–1.2)
Total Protein: 6 g/dL — ABNORMAL LOW (ref 6.5–8.1)

## 2024-03-05 LAB — RAD ONC ARIA SESSION SUMMARY
Course Elapsed Days: 12
Plan Fractions Treated to Date: 9
Plan Fractions Treated to Date: 9
Plan Fractions Treated to Date: 9
Plan Prescribed Dose Per Fraction: 3 Gy
Plan Prescribed Dose Per Fraction: 3 Gy
Plan Prescribed Dose Per Fraction: 3 Gy
Plan Total Fractions Prescribed: 10
Plan Total Fractions Prescribed: 10
Plan Total Fractions Prescribed: 10
Plan Total Prescribed Dose: 30 Gy
Plan Total Prescribed Dose: 30 Gy
Plan Total Prescribed Dose: 30 Gy
Reference Point Dosage Given to Date: 27 Gy
Reference Point Dosage Given to Date: 27 Gy
Reference Point Dosage Given to Date: 27 Gy
Reference Point Session Dosage Given: 3 Gy
Reference Point Session Dosage Given: 3 Gy
Reference Point Session Dosage Given: 3 Gy
Session Number: 9

## 2024-03-05 LAB — CBC WITH DIFFERENTIAL (CANCER CENTER ONLY)
Abs Immature Granulocytes: 0.01 10*3/uL (ref 0.00–0.07)
Basophils Absolute: 0 10*3/uL (ref 0.0–0.1)
Basophils Relative: 1 %
Eosinophils Absolute: 0.5 10*3/uL (ref 0.0–0.5)
Eosinophils Relative: 20 %
HCT: 34.6 % — ABNORMAL LOW (ref 39.0–52.0)
Hemoglobin: 11.1 g/dL — ABNORMAL LOW (ref 13.0–17.0)
Immature Granulocytes: 0 %
Lymphocytes Relative: 9 %
Lymphs Abs: 0.2 10*3/uL — ABNORMAL LOW (ref 0.7–4.0)
MCH: 27.6 pg (ref 26.0–34.0)
MCHC: 32.1 g/dL (ref 30.0–36.0)
MCV: 86.1 fL (ref 80.0–100.0)
Monocytes Absolute: 0.3 10*3/uL (ref 0.1–1.0)
Monocytes Relative: 11 %
Neutro Abs: 1.4 10*3/uL — ABNORMAL LOW (ref 1.7–7.7)
Neutrophils Relative %: 59 %
Platelet Count: 119 10*3/uL — ABNORMAL LOW (ref 150–400)
RBC: 4.02 MIL/uL — ABNORMAL LOW (ref 4.22–5.81)
RDW: 21.6 % — ABNORMAL HIGH (ref 11.5–15.5)
WBC Count: 2.4 10*3/uL — ABNORMAL LOW (ref 4.0–10.5)
nRBC: 0 % (ref 0.0–0.2)

## 2024-03-05 MED ORDER — LIDOCAINE VISCOUS HCL 2 % MT SOLN
15.0000 mL | OROMUCOSAL | 1 refills | Status: DC | PRN
Start: 2024-03-05 — End: 2024-03-05
  Filled 2024-03-05: qty 100, fill #0

## 2024-03-05 MED ORDER — LIDOCAINE VISCOUS HCL 2 % MT SOLN
15.0000 mL | OROMUCOSAL | 1 refills | Status: DC | PRN
Start: 2024-03-05 — End: 2024-06-19
  Filled 2024-03-05: qty 200, 3d supply, fill #0
  Filled 2024-03-12: qty 200, 5d supply, fill #0

## 2024-03-05 NOTE — Assessment & Plan Note (Addendum)
 Expect improvement over time. On aspirin 

## 2024-03-05 NOTE — Assessment & Plan Note (Addendum)
 Recent b12 adequate Iron  deficiency. S/p iv iron  venofer  x 4

## 2024-03-05 NOTE — Progress Notes (Signed)
 [facility] Healthcare Advance Directives Clinical Social Work  Patient presented to Advance Directives Clinic  to review and complete healthcare advance directives.  Clinical Social Worker met with patient and Spouse Xsavier Seeley.  CSW assessed patient's capacity due to provider note mentioning bits of confusion. Patient was oriented  to time,place, person, and date. Patient was able to verbalize purpose of HCPOA documents and verbalize preferred medical wishes. CSW continued with AD completion.  The patient designated Ladarrell Cornwall as their primary healthcare agent and Jaron Czarnecki as their secondary agent.  Patient also completed healthcare living will.    Documents were notarized and copies made for patient/family. Clinical Social Worker will send documents to medical records to be scanned into patient's chart.Clinical Social Worker encouraged patient/family to contact with any additional questions or concerns.   Lizbeth Sprague, LCSW Clinical Social Worker [facility]

## 2024-03-06 ENCOUNTER — Telehealth: Payer: Self-pay

## 2024-03-06 ENCOUNTER — Ambulatory Visit

## 2024-03-06 LAB — TESTOSTERONE: Testosterone: 6 ng/dL — ABNORMAL LOW (ref 264–916)

## 2024-03-06 LAB — PROSTATE-SPECIFIC AG, SERUM (LABCORP): Prostate Specific Ag, Serum: 11.4 ng/mL — ABNORMAL HIGH (ref 0.0–4.0)

## 2024-03-06 NOTE — Telephone Encounter (Signed)
 RN has spoken to Antonio Oliver at length and at this moment she is beside herself in the changes she has observed with Antonio Oliver over the last couple of weeks.  She reports he has gotten weaker & falling. She said if this continues by next week he will be bedridden.   His pain remains high with current change in medication (Fentynal has been d/c'd). Currently on Oxycontin  20mg  every 12 hours in addition to Percocet 10-325mg  every 4 hours as needed per Palliative team.  She is looking in to getting some help in the home, reports she has spoken with Social work and they are assisting with some resources.  Antonio Oliver reports they had spoken with Antonio Oliver about possible side effects from the antiandrogen medications.  Thinking about stopping those meds in hopes possible recover of strength.  She wants to see what PSA results look like.  She asks to put radiation treatment on hold until Thursday 03/08/2024 by then they can figure out if they would like to continue the final treatment or not.  At this time it's really about quality of life versus quantity per Antonio Oliver.  RN inform her per Antonio Oliver that Antonio Oliver has completed 9/10 treatments and that its ok if she wants to stop final treatment.  RN also inform Antonio Oliver that sometimes it takes 14-21 days for radiation to take effect, so at this time if no improvement at this time eventually there will be.  Antonio Oliver verbalized understanding and again stated that she will call us  on Thursday 03/08/2024 with final decision.

## 2024-03-06 NOTE — Progress Notes (Signed)
 Remote pacemaker transmission.

## 2024-03-06 NOTE — Addendum Note (Signed)
 Addended by: TAWNI DRILLING D on: 03/06/2024 01:15 PM   Modules accepted: Orders

## 2024-03-07 ENCOUNTER — Ambulatory Visit

## 2024-03-07 ENCOUNTER — Ambulatory Visit (INDEPENDENT_AMBULATORY_CARE_PROVIDER_SITE_OTHER)

## 2024-03-07 ENCOUNTER — Other Ambulatory Visit: Payer: Self-pay

## 2024-03-07 ENCOUNTER — Other Ambulatory Visit: Payer: Self-pay | Admitting: Nurse Practitioner

## 2024-03-07 ENCOUNTER — Inpatient Hospital Stay: Attending: Physician Assistant | Admitting: Nurse Practitioner

## 2024-03-07 DIAGNOSIS — Z515 Encounter for palliative care: Secondary | ICD-10-CM

## 2024-03-07 DIAGNOSIS — J988 Other specified respiratory disorders: Secondary | ICD-10-CM | POA: Insufficient documentation

## 2024-03-07 DIAGNOSIS — I10 Essential (primary) hypertension: Secondary | ICD-10-CM | POA: Insufficient documentation

## 2024-03-07 DIAGNOSIS — R070 Pain in throat: Secondary | ICD-10-CM | POA: Insufficient documentation

## 2024-03-07 DIAGNOSIS — N400 Enlarged prostate without lower urinary tract symptoms: Secondary | ICD-10-CM | POA: Insufficient documentation

## 2024-03-07 DIAGNOSIS — Z7189 Other specified counseling: Secondary | ICD-10-CM

## 2024-03-07 DIAGNOSIS — Z66 Do not resuscitate: Secondary | ICD-10-CM | POA: Insufficient documentation

## 2024-03-07 DIAGNOSIS — G893 Neoplasm related pain (acute) (chronic): Secondary | ICD-10-CM

## 2024-03-07 DIAGNOSIS — R296 Repeated falls: Secondary | ICD-10-CM | POA: Insufficient documentation

## 2024-03-07 DIAGNOSIS — N200 Calculus of kidney: Secondary | ICD-10-CM | POA: Insufficient documentation

## 2024-03-07 DIAGNOSIS — Z79899 Other long term (current) drug therapy: Secondary | ICD-10-CM | POA: Insufficient documentation

## 2024-03-07 DIAGNOSIS — I442 Atrioventricular block, complete: Secondary | ICD-10-CM

## 2024-03-07 DIAGNOSIS — Z191 Hormone sensitive malignancy status: Secondary | ICD-10-CM

## 2024-03-07 DIAGNOSIS — Z8679 Personal history of other diseases of the circulatory system: Secondary | ICD-10-CM | POA: Insufficient documentation

## 2024-03-07 DIAGNOSIS — Z7982 Long term (current) use of aspirin: Secondary | ICD-10-CM | POA: Insufficient documentation

## 2024-03-07 DIAGNOSIS — R530 Neoplastic (malignant) related fatigue: Secondary | ICD-10-CM

## 2024-03-07 DIAGNOSIS — R634 Abnormal weight loss: Secondary | ICD-10-CM | POA: Diagnosis not present

## 2024-03-07 DIAGNOSIS — R1310 Dysphagia, unspecified: Secondary | ICD-10-CM | POA: Insufficient documentation

## 2024-03-07 DIAGNOSIS — K6389 Other specified diseases of intestine: Secondary | ICD-10-CM | POA: Insufficient documentation

## 2024-03-07 DIAGNOSIS — Z923 Personal history of irradiation: Secondary | ICD-10-CM | POA: Insufficient documentation

## 2024-03-07 DIAGNOSIS — C61 Malignant neoplasm of prostate: Secondary | ICD-10-CM | POA: Insufficient documentation

## 2024-03-07 DIAGNOSIS — R630 Anorexia: Secondary | ICD-10-CM | POA: Diagnosis not present

## 2024-03-07 DIAGNOSIS — C7951 Secondary malignant neoplasm of bone: Secondary | ICD-10-CM | POA: Insufficient documentation

## 2024-03-07 DIAGNOSIS — Z885 Allergy status to narcotic agent status: Secondary | ICD-10-CM | POA: Insufficient documentation

## 2024-03-07 MED ORDER — DEXAMETHASONE 4 MG PO TABS
4.0000 mg | ORAL_TABLET | Freq: Two times a day (BID) | ORAL | 0 refills | Status: DC
  Filled 2024-03-07: qty 30, 15d supply, fill #0

## 2024-03-08 ENCOUNTER — Telehealth: Payer: Self-pay

## 2024-03-08 ENCOUNTER — Ambulatory Visit

## 2024-03-08 ENCOUNTER — Encounter (HOSPITAL_COMMUNITY): Payer: Self-pay

## 2024-03-08 ENCOUNTER — Encounter: Payer: Self-pay | Admitting: Physician Assistant

## 2024-03-08 ENCOUNTER — Other Ambulatory Visit (HOSPITAL_COMMUNITY): Payer: Self-pay

## 2024-03-08 LAB — CUP PACEART REMOTE DEVICE CHECK
Battery Remaining Longevity: 47 mo
Battery Remaining Percentage: 40 %
Battery Voltage: 2.99 V
Brady Statistic AP VP Percent: 6.4 %
Brady Statistic AP VS Percent: 1.5 %
Brady Statistic AS VP Percent: 16 %
Brady Statistic AS VS Percent: 72 %
Brady Statistic RA Percent Paced: 1.9 %
Brady Statistic RV Percent Paced: 22 %
Date Time Interrogation Session: 20250702042436
Implantable Lead Connection Status: 753985
Implantable Lead Connection Status: 753985
Implantable Lead Implant Date: 20180710
Implantable Lead Implant Date: 20180710
Implantable Lead Location: 753859
Implantable Lead Location: 753860
Implantable Pulse Generator Implant Date: 20180710
Lead Channel Impedance Value: 410 Ohm
Lead Channel Impedance Value: 460 Ohm
Lead Channel Pacing Threshold Amplitude: 0.5 V
Lead Channel Pacing Threshold Amplitude: 0.875 V
Lead Channel Pacing Threshold Pulse Width: 0.4 ms
Lead Channel Pacing Threshold Pulse Width: 0.4 ms
Lead Channel Sensing Intrinsic Amplitude: 0.7 mV
Lead Channel Sensing Intrinsic Amplitude: 2.3 mV
Lead Channel Setting Pacing Amplitude: 1.125
Lead Channel Setting Pacing Amplitude: 2 V
Lead Channel Setting Pacing Pulse Width: 0.4 ms
Lead Channel Setting Sensing Sensitivity: 0.5 mV
Pulse Gen Model: 2272
Pulse Gen Serial Number: 8912213

## 2024-03-08 MED ORDER — OXYCODONE-ACETAMINOPHEN 10-325 MG PO TABS
1.0000 | ORAL_TABLET | ORAL | 0 refills | Status: DC | PRN
  Filled 2024-03-08: qty 60, 10d supply, fill #0

## 2024-03-08 MED ORDER — OXYCODONE HCL ER 20 MG PO T12A
20.0000 mg | EXTENDED_RELEASE_TABLET | Freq: Three times a day (TID) | ORAL | 0 refills | Status: DC
  Filled 2024-03-08 – 2024-03-12 (×2): qty 45, 15d supply, fill #0

## 2024-03-08 NOTE — Telephone Encounter (Signed)
 RN called Mrs. Widmer to check back to see if Mr. Hayduk will be in for treatment this afternoon.  She reports that Mr. Pavao will not be in today for treatment to check back on Monday 03/12/2024.  At this time, they are working to stop/or change some of  his chemo medication to see if he can gain some of his strength back.  She said they have talked with Levon Borer, NP about managing pain symptoms/medication.  Will inform the team and check back on Monday 03/12/2024.

## 2024-03-08 NOTE — Progress Notes (Signed)
 Palliative Medicine [facility]  [phone] [phone]   Name: Antonio Oliver Date: 03/08/2024 MRN: [id number]  DOB: 1956-03-11  Patient Care Team: Jama Chow, MD as PCP - General (Internal Medicine) Inocencio Soyla Lunger, MD as PCP - Electrophysiology (Cardiology) Pietro Redell RAMAN, MD as PCP - Cardiology (Cardiology) Pietro Redell RAMAN, MD as Consulting Physician (Cardiology) Skeet Juliene SAUNDERS, DO as Consulting Physician (Neurology) Vertell Pont, RN as Oncology Nurse Navigator Pickenpack-Cousar, Fannie SAILOR, NP as Nurse Practitioner [facility] and Palliative Medicine)   I connected with Antonio Oliver on 03/07/24 at  4:15 PM EDT by telephone and verified that I am speaking with the correct person using two identifiers.   I discussed the limitations, risks, security and privacy concerns of performing an evaluation and management service by telemedicine and the availability of in-person appointments. I also discussed with the patient that there may be a patient responsible charge related to this service. The patient expressed understanding and agreed to proceed.   Other persons participating in the visit and their role in the encounter: Wife    Patient's location: Home   Provider's location: [facility]   INTERVAL HISTORY: Antonio Oliver is a 68 y.o. male with oncologic medical history including metastatic prostate cancer (02/2024) with newly diffuse sclerotic bone metastasis. Currently undergoing palliative radiation with plans for triple therapy post radiation. Palliative is seeing patient for symptom management and goals of care.   SOCIAL HISTORY:     reports that he has never smoked. He has never used smokeless tobacco. He reports that he does not currently use alcohol. He reports that he does not use drugs.  ADVANCE DIRECTIVES:  Completed 03/05/24  CODE STATUS: Full code  PAST MEDICAL HISTORY: Past Medical History:  Diagnosis Date   Anxiety and  depression    Ascending aorta dilation (HCC) 03/06/2019   4.3 cm 2022   Benign prostatic hyperplasia without lower urinary tract symptoms 11/17/2016   Last Assessment & Plan:  Stable PSA today.  Formatting of this note might be different from the original. Last Assessment & Plan:  Stable PSA today. Last Assessment & Plan:  Formatting of this note might be different from the original. Stable PSA today.   BPH (benign prostatic hyperplasia)    Bradycardia 02/01/2019   Cervical myelopathy (HCC) 11/14/2018   Charcot's joint of foot 03/05/2019   Chronic back pain 04/03/2013   Chronic inflammatory arthritis 11/17/2016   History of positive rheumatoid factor in the past. Denies placement on immunosuppressive therapy   Chronic pain syndrome    Failed back surgical syndrome.  Chronic neck pain.  Chronic low back pain with sciatica bilateral   Colon cancer screening    2023 cologuard neg   Complete heart block (HCC)    Following AVR s/p [employer] Jude PPM in 03/2017   DM2 (diabetes mellitus, type 2) (HCC) 04/03/2013   Last Assessment & Plan:  Formatting of this note might be different from the original. Diabetes is unchanged.  Continue current treatment regimen. Regular aerobic exercise. Diabetes will be reassessed in 3 months. Will check A1c today. Denies any problems with feet or sensation.   Elevated PSA 11/2021   Enthesopathy of ankle and tarsus 04/04/2013   Last Assessment & Plan:  Continues with swelling after prolonged standing.  Still  Wearing support hose, which helps.   Essential hypertension 10/30/2018   FHx: migraine headaches 04/04/2013   Fracture of capitate bone of wrist 02/26/2019   Fracture of  tibia 03/05/2019   GERD (gastroesophageal reflux disease) 10/30/2018   Glaucoma 04/03/2013   Hemangioma of skin and subcutaneous tissue 07/07/2017   History of aortic valve replacement 2018   bicuspid AV   History of kidney stones 11/17/2016   History of sleep apnea    wt loss->gone    Hypothyroidism 10/30/2018   IDA (iron  deficiency anemia) 11/2021   malabs d/t gastric bipass   Lesion of plantar nerve 04/04/2013   Lumbar stenosis with neurogenic claudication 01/08/2021   Major depressive disorder, recurrent (HCC)    ECT in the remote past.  Patient disabled due to recurrent depression and chronic pain.   Migraine syndrome    Mixed dyslipidemia 10/30/2018   Morbid obesity (HCC) 10/27/2011   Nondisplaced fracture of medial cuneiform of left foot, initial encounter for closed fracture 11/16/2018   Osteoarthritis 10/30/2018   Personal history of tobacco use, presenting hazards to health 04/04/2013   Presence of permanent cardiac pacemaker    Severe single current episode of major depressive disorder, without psychotic features (HCC) 11/17/2016   Status post bariatric surgery 04/05/2011   bipass surg --presurg wt 310 lbs   Syncope 02/14/2019   Vitamin D  deficiency 10/30/2018    ALLERGIES:  is allergic to calcium  carbonate and ms contin [morphine].  MEDICATIONS:  Current Outpatient Medications  Medication Sig Dispense Refill   dexamethasone  (DECADRON ) 4 MG tablet Take 1 tablet (4 mg total) by mouth 2 (two) times daily with a meal. 30 tablet 0   fluconazole (DIFLUCAN) 100 MG tablet Take 1 tablet (100 mg total) by mouth daily. Take 2 tabs x 1, then 1 tab QD x 9 additional days. 11 tablet 0   amoxicillin  (AMOXIL ) 500 MG capsule TAKE 4 CAPSULES BY MOUTH 1 HOUR PRIOR TO DENTAL APPOINTMENT 4 capsule 0   aspirin  EC 81 MG tablet Take 81 mg by mouth daily.     atorvastatin  (LIPITOR) 20 MG tablet Take 20 mg by mouth daily at 6 PM.      baclofen (LIORESAL) 10 MG tablet Take 10 mg by mouth 3 (three) times daily.     buPROPion  (WELLBUTRIN  SR) 200 MG 12 hr tablet Take 200 mg by mouth 2 (two) times daily.     busPIRone  (BUSPAR ) 10 MG tablet Take 20 mg by mouth 2 (two) times daily.     CALCIUM  CITRATE PO Take 500 mg by mouth 2 (two) times daily.     Cholecalciferol  (VITAMIN D -3 PO)  Take 5,000 capsules by mouth daily.     clobetasol (TEMOVATE) 0.05 % external solution Apply 1 Application topically as needed.     clonazePAM  (KLONOPIN ) 0.5 MG tablet Take 0.5 mg by mouth 2 (two) times daily as needed for anxiety.     darolutamide  (NUBEQA ) 300 MG tablet Take 2 tablets (600 mg total) by mouth 2 (two) times daily with a meal. 120 tablet 11   diclofenac Sodium (VOLTAREN) 1 % GEL Apply 1 application  topically as needed.     dicyclomine (BENTYL) 20 MG tablet Take 20 mg by mouth 4 (four) times daily as needed.     docusate sodium  (COLACE) 100 MG capsule Take 1 capsule (100 mg total) by mouth 2 (two) times daily. 10 capsule 0   DULoxetine  (CYMBALTA ) 60 MG capsule Take 60 mg by mouth 2 (two) times daily.      ferrous sulfate  325 (65 FE) MG EC tablet Take 325 mg by mouth daily.     finasteride  (PROSCAR ) 5 MG tablet Take 5  mg by mouth daily.     fluticasone  (FLONASE ) 50 MCG/ACT nasal spray Place 2 sprays into the nose daily as needed for allergies or rhinitis.     folic acid  (FOLVITE ) 1 MG tablet Take 1 mg by mouth daily.     furosemide  (LASIX ) 20 MG tablet Take 1 tablet (20 mg total) by mouth as needed (for leg swelling). 30 tablet 4   Galcanezumab -gnlm (EMGALITY ) 120 MG/ML SOAJ Inject 120 mg into the skin every 28 (twenty-eight) days. 1 mL 5   hydrALAZINE  (APRESOLINE ) 10 MG tablet Take 1 tablet BY MOUTH three times daily for systolic bp > 140 or diastolic bp > 90 729 tablet 3   insulin  lispro (HUMALOG) 100 UNIT/ML injection as directed Injection as needed     iron  sucrose 200 mg in sodium chloride  0.9 % 100 mL Inject 200 mg into the vein once a week.     levothyroxine  (SYNTHROID ) 125 MCG tablet Take 125 mcg by mouth daily.      lidocaine  (LIDODERM ) 5 % as needed.     lidocaine  (XYLOCAINE ) 2 % solution Use as directed 15 mLs in the mouth or throat as needed for mouth pain. Swish and split 200 mL 1   loratadine  (CLARITIN ) 10 MG tablet Take 10 mg by mouth daily as needed for allergies.      Magnesium  250 MG TABS 2 tablets (500mg ) Orally Twice a day     meclizine (ANTIVERT) 25 MG tablet Take 25 mg by mouth 3 (three) times daily as needed for dizziness.     metFORMIN  (GLUCOPHAGE ) 1000 MG tablet Take 1,000 mg by mouth 2 (two) times daily with a meal.     mexiletine (MEXITIL) 250 MG capsule Take 1 capsule (250 mg total) by mouth 2 (two) times daily. 60 capsule 3   Multiple Vitamins-Minerals (MULTIVITAMIN WITH MINERALS) tablet Take 1 tablet by mouth daily.     Multiple Vitamins-Minerals (PRESERVISION/LUTEIN) CAPS Take 1 capsule by mouth at bedtime.     mupirocin  ointment (BACTROBAN ) 2 % Apply 1 application topically 3 (three) times daily as needed (infection).     naloxone  (NARCAN ) nasal spray 4 mg/0.1 mL Instill or spray into the nose if you are experiencing shortness of breath or signs of opioid overdose. 1 each 0   omeprazole (PRILOSEC) 20 MG capsule Take 20 mg by mouth 2 (two) times daily before a meal.      oxyCODONE  (OXYCONTIN ) 20 mg 12 hr tablet Take 1 tablet (20 mg total) by mouth every 12 (twelve) hours. 30 tablet 0   oxyCODONE -acetaminophen  (PERCOCET) 10-325 MG tablet Take 0.5-1 tablets by mouth every 4 (four) hours as needed for pain. 60 tablet 0   polyethylene glycol (MIRALAX  / GLYCOLAX ) 17 g packet Take 17 g by mouth 2 (two) times daily.     potassium chloride  (KLOR-CON ) 10 MEQ tablet Take 10 mEq by mouth as needed (with lasix  dose).     pregabalin  (LYRICA ) 100 MG capsule Take 100 mg by mouth 3 (three) times daily.     prochlorperazine (COMPAZINE) 5 MG tablet Take 1 tablet (5 mg total) by mouth every 6 (six) hours as needed for nausea or vomiting. 30 tablet 0   pseudoephedrine (SUDAFED) 30 MG tablet Take 30 mg by mouth every 6 (six) hours as needed.     relugolix  (ORGOVYX ) 120 MG tablet Take 3 tablets (360 mg total) by mouth on day 1, then take 1 tablet (120 mg total) daily thereafter. 30 tablet 0   Semaglutide ,0.25 or  0.5MG /DOS, (OZEMPIC , 0.25 OR 0.5 MG/DOSE,) 2 MG/3ML SOPN  Inject 0.5 mg into the skin once a week. 3 mL 3   silodosin (RAPAFLO) 8 MG CAPS capsule Take 8 mg by mouth daily.     solifenacin (VESICARE) 10 MG tablet Take 10 mg by mouth at bedtime.     traMADol  (ULTRAM ) 50 MG tablet Take 50 mg by mouth 3 (three) times daily.     Ubrogepant  (UBRELVY ) 100 MG TABS Take 1 tablet (100 mg total) by mouth as needed (May repeat in 2 hours.  Maximum 2 tablets in 24 hours.). 48 tablet 3   No current facility-administered medications for this visit.    VITAL SIGNS: There were no vitals taken for this visit. There were no vitals filed for this visit.  Estimated body mass index is 25.37 kg/m as calculated from the following:   Height as of 03/05/24: 6' 3 (1.905 m).   Weight as of 03/05/24: 203 lb (92.1 kg).   PERFORMANCE STATUS (ECOG) : 3 - Symptomatic, >50% confined to bed  IMPRESSION:  I connected by phone with patient's wife on behalf of patient. Mrs. Oliver shares concerns that patient is showing rapid signs of decline. Patient has fallen approximately 11 times denies injury. He has history of hypotension due to autonomic disorder. Appetite remains poor. Insomnia with intermittent opportunity to sleep. Wife reports unsteady gait when attempting to ambulate.   Antonio's pain intensity is worsening. He is complaining more of pain which has never been the case. He speaks to chronic back and neck pain which he has been able to manage over the years. This pain is different and current regimen is not providing as much control.   Wife reports symptom onset over the past 5-7 days. They are associating worsening symptoms to oral chemo which has been discussed with Dr. Tina.   We discussed potential etiology at length including extensive medication review. He is tolerating OxyContin  without difficulty. No association to symptoms. Previously on Fentanyl  patch however did not feel it was offering much relief. Wife shared at the time in past he was told that he does not  absorb topical medications after testing in the past with use of fentanyl  patch.   I advised due to recurrent falls and concerns for hypotension despite etiology will not increase opioids at this time with absence of comfort focused setting. Education provided on use of steroids to assist in better pain management, appetite, and fatigue. He has been instructed to hold all oral chemotherapy medications until further notice to allow for ability to assess any noted improvement in symptoms. Wife verbalized understanding. Dr. Tina aware of plan and agree.   I empathetically approached discussions regarding healthcare limitations and goals of care. Wife is emotional expressing patient's ongoing decline and decrease in quality of life. Patient and wife desires to minimize suffering and symptoms as best possible. I discussed at length hospice's goals and philosophy of care including what care would look like in the home. Wife verbalized understanding. She will continue to observe over the next 3-4 days, evaluate response to holding oral therapies, continue with current pain management with support of dexamethasone  while preparing to making complex decisions at visit next week.   All questions answered and support provided.   Plan -Hold darolutamide  and orgovyx  until follow-up. Will engage in further discussions with Dr. Tina.  -Dexamethasone  4mg  twice daily x 7 days.  -Continue current pain regimen.  -See patient back in office in a week. Call sooner if  needed.   Patient expressed understanding and was in agreement with this plan. He also understands that He can call the clinic at any time with any questions, concerns, or complaints.   Any controlled substances utilized were prescribed in the context of palliative care. PDMP has been reviewed.   I personally spent a total of 45 minutes in the care of the patient today including preparing to see the patient, getting/reviewing separately obtained history,  performing a medically appropriate exam/evaluation, counseling and educating, placing orders, referring and communicating with other health care professionals, documenting clinical information in the EHR, communicating results, and coordinating care. Visit consisted of counseling and education dealing with the complex and emotionally intense issues of symptom management and palliative care in the setting of serious and potentially life-threatening illness.  Levon Borer, AGPCNP-BC  Palliative Medicine Team/ Cancer Center

## 2024-03-08 NOTE — Addendum Note (Signed)
 Addended by: PICKENPACK-COUSAR, Kattleya Kuhnert N on: 03/08/2024 03:47 PM   Modules accepted: Orders

## 2024-03-09 ENCOUNTER — Other Ambulatory Visit (HOSPITAL_COMMUNITY): Payer: Self-pay

## 2024-03-10 ENCOUNTER — Other Ambulatory Visit (HOSPITAL_COMMUNITY): Payer: Self-pay

## 2024-03-10 MED ORDER — DEXAMETHASONE 4 MG PO TABS
4.0000 mg | ORAL_TABLET | Freq: Two times a day (BID) | ORAL | 0 refills | Status: DC
  Filled 2024-03-10 – 2024-03-12 (×3): qty 10, 5d supply, fill #0

## 2024-03-11 NOTE — Assessment & Plan Note (Signed)
 Expect improvement over time. On aspirin 

## 2024-03-11 NOTE — Progress Notes (Unsigned)
 [facility] OFFICE PROGRESS NOTE  Patient Care Team: Jama Chow, MD as PCP - General (Internal Medicine) Inocencio Soyla Lunger, MD as PCP - Electrophysiology (Cardiology) Pietro Redell RAMAN, MD as PCP - Cardiology (Cardiology) Pietro Redell RAMAN, MD as Consulting Physician (Cardiology) Skeet Juliene SAUNDERS, DO as Consulting Physician (Neurology) Vertell Pont, RN as Oncology Nurse Navigator Pickenpack-Cousar, Fannie SAILOR, NP as Nurse Practitioner (Hospice and Palliative Medicine)  [name] is a 68 y.o.male with history of gastric bypass, pacemaker, aortic valve replacement and thoracic aortic aneurysm, HTN, HLD, DM2, Obesity, arthritis [facility].   Current diagnosis: mHSPC Initial diagnosis: mHSPC with bone and LN metastses Germline testing: will refer Somatic testing: not yet Treatment: at diagnosis, palliative radiation to painful osseous right rib, lumbar and thoracic spine metastatic disease.   Start date for both Nubeqa  and Orgovyx : 02/23/24  He has decreased functional status.  Patient continues to have physical decline, and pain reported not much improved after radiation.  Saw palliative care last week and ADT and darolutamide  was on hold.  Report his quality of life is not great.  He is fairly immobile, and wheelchair-bound.  Functional ability has not improved despite cancer under control.  Wife continue metoprolol  hospice and request hospice today.  We discussed hospice care involving looks like.  He would like to proceed.  He will follow-up with palliative care after appointment as well.  Mother had early onset breast cancer and he has metastatic prostate cancer.  Will refer for genetic testing.  He has 1 daughter and other first-degree relative that may be affected if tested positive. Assessment & Plan Goals of care, counseling/discussion Discussed goals of care. He has no improvement on his ability or quality of life. Wife and him  are ready for hospice.   Spoke to palliative care, may place hospice referral. Hormone sensitive prostate cancer (HCC) Started darolutamide  600 mg twice daily. Will continue  continue relugolix  Will complete palliative radiation on 7/1 Interval response to treatment with PSA dropping  Will obtain genetic testing.    Antonio JAYSON Chihuahua, MD  INTERVAL HISTORY: Patient returns for follow-up. Report he fell last week. Wife picked up dex but lost it. He denies any difference after holding daro and relugolix . Energy is poor and appetite is not good. Report discomfort in the throat and some trouble swallowing. He had botux a while back to help. He has coughing. No fever, chills. Sit down most of the day. Report he has no improvement.  Oncology History  Hormone sensitive prostate cancer (HCC)  12/27/2023 Imaging   He underwent CT chest on 12/30/2023 due to history of thoracic aortic aneurysm. Findings showed sclerotic bony lesions are present. The largest is in the T3 vertebral body measuring 1.7 cm.    01/19/2024 Tumor Marker   PSA 99.7   01/27/2024 PET scan   PSMA PET Intense radiotracer accumulation in the anterior and left lateral prostate, consistent with primary prostate carcinoma.   5 mm right internal iliac lymph node with intense radiotracer accumulation, consistent with metastatic disease.   Diffuse sclerotic bone metastases.   Bilateral nephrolithiasis.  No hydronephrosis.   Diffuse colonic distension by large stool burden. Recommend clinical correlation for possible constipation.   02/15/2024 Pathology Results   Bone, biopsy, Left iliac lesion :       -METASTATIC PROSTATE ADENOCARCINOMA (CK AE1/AE3 AND NKX3.1 POSITIVE, WITH      SATISFACTORY CONTROLS.    02/17/2024 Initial Diagnosis   Hormone  sensitive prostate cancer (HCC)      PHYSICAL EXAMINATION: ECOG PERFORMANCE STATUS: 3 - Symptomatic, >50% confined to bed  Vitals:   03/12/24 0924  BP: 102/73  Pulse: 86  Resp: 18   Temp: (!) 97.3 F (36.3 C)  SpO2: 100%   There were no vitals filed for this visit.  GENERAL: alert, no distress.  On wheelchair SKIN:  few arm bruising, no jaundice on exposed skin LUNGS: normal breathing effort  Relevant data reviewed during this visit included labs.

## 2024-03-12 ENCOUNTER — Other Ambulatory Visit (HOSPITAL_COMMUNITY): Payer: Self-pay

## 2024-03-12 ENCOUNTER — Ambulatory Visit

## 2024-03-12 ENCOUNTER — Encounter: Payer: Self-pay | Admitting: Nurse Practitioner

## 2024-03-12 ENCOUNTER — Inpatient Hospital Stay

## 2024-03-12 ENCOUNTER — Other Ambulatory Visit: Payer: Self-pay | Admitting: Genetic Counselor

## 2024-03-12 ENCOUNTER — Ambulatory Visit (HOSPITAL_BASED_OUTPATIENT_CLINIC_OR_DEPARTMENT_OTHER): Admitting: Nurse Practitioner

## 2024-03-12 ENCOUNTER — Telehealth: Payer: Self-pay

## 2024-03-12 ENCOUNTER — Telehealth: Payer: Self-pay | Admitting: Neurology

## 2024-03-12 VITALS — BP 102/73 | HR 86 | Temp 97.3°F | Resp 18

## 2024-03-12 DIAGNOSIS — C61 Malignant neoplasm of prostate: Secondary | ICD-10-CM

## 2024-03-12 DIAGNOSIS — J988 Other specified respiratory disorders: Secondary | ICD-10-CM | POA: Diagnosis not present

## 2024-03-12 DIAGNOSIS — G4709 Other insomnia: Secondary | ICD-10-CM

## 2024-03-12 DIAGNOSIS — Z191 Hormone sensitive malignancy status: Secondary | ICD-10-CM

## 2024-03-12 DIAGNOSIS — Z7982 Long term (current) use of aspirin: Secondary | ICD-10-CM | POA: Diagnosis not present

## 2024-03-12 DIAGNOSIS — G893 Neoplasm related pain (acute) (chronic): Secondary | ICD-10-CM | POA: Diagnosis not present

## 2024-03-12 DIAGNOSIS — R1310 Dysphagia, unspecified: Secondary | ICD-10-CM | POA: Diagnosis not present

## 2024-03-12 DIAGNOSIS — Z515 Encounter for palliative care: Secondary | ICD-10-CM | POA: Diagnosis not present

## 2024-03-12 DIAGNOSIS — R296 Repeated falls: Secondary | ICD-10-CM | POA: Diagnosis not present

## 2024-03-12 DIAGNOSIS — C7951 Secondary malignant neoplasm of bone: Secondary | ICD-10-CM | POA: Diagnosis not present

## 2024-03-12 DIAGNOSIS — N400 Enlarged prostate without lower urinary tract symptoms: Secondary | ICD-10-CM | POA: Diagnosis not present

## 2024-03-12 DIAGNOSIS — I10 Essential (primary) hypertension: Secondary | ICD-10-CM | POA: Diagnosis not present

## 2024-03-12 DIAGNOSIS — Z1379 Encounter for other screening for genetic and chromosomal anomalies: Secondary | ICD-10-CM

## 2024-03-12 DIAGNOSIS — Z8679 Personal history of other diseases of the circulatory system: Secondary | ICD-10-CM | POA: Diagnosis present

## 2024-03-12 DIAGNOSIS — T148XXA Other injury of unspecified body region, initial encounter: Secondary | ICD-10-CM

## 2024-03-12 DIAGNOSIS — R630 Anorexia: Secondary | ICD-10-CM

## 2024-03-12 DIAGNOSIS — R070 Pain in throat: Secondary | ICD-10-CM | POA: Diagnosis not present

## 2024-03-12 DIAGNOSIS — Z79899 Other long term (current) drug therapy: Secondary | ICD-10-CM | POA: Diagnosis not present

## 2024-03-12 DIAGNOSIS — Z7189 Other specified counseling: Secondary | ICD-10-CM

## 2024-03-12 DIAGNOSIS — D649 Anemia, unspecified: Secondary | ICD-10-CM

## 2024-03-12 DIAGNOSIS — R530 Neoplastic (malignant) related fatigue: Secondary | ICD-10-CM

## 2024-03-12 DIAGNOSIS — Z885 Allergy status to narcotic agent status: Secondary | ICD-10-CM | POA: Diagnosis not present

## 2024-03-12 DIAGNOSIS — K6389 Other specified diseases of intestine: Secondary | ICD-10-CM | POA: Diagnosis not present

## 2024-03-12 DIAGNOSIS — N200 Calculus of kidney: Secondary | ICD-10-CM | POA: Diagnosis not present

## 2024-03-12 DIAGNOSIS — Z66 Do not resuscitate: Secondary | ICD-10-CM | POA: Diagnosis not present

## 2024-03-12 DIAGNOSIS — Z923 Personal history of irradiation: Secondary | ICD-10-CM | POA: Diagnosis not present

## 2024-03-12 LAB — GENETIC SCREENING ORDER

## 2024-03-12 LAB — CBC WITH DIFFERENTIAL (CANCER CENTER ONLY)
Abs Immature Granulocytes: 0.02 K/uL (ref 0.00–0.07)
Basophils Absolute: 0 K/uL (ref 0.0–0.1)
Basophils Relative: 1 %
Eosinophils Absolute: 0.1 K/uL (ref 0.0–0.5)
Eosinophils Relative: 3 %
HCT: 34.5 % — ABNORMAL LOW (ref 39.0–52.0)
Hemoglobin: 11.3 g/dL — ABNORMAL LOW (ref 13.0–17.0)
Immature Granulocytes: 1 %
Lymphocytes Relative: 11 %
Lymphs Abs: 0.3 K/uL — ABNORMAL LOW (ref 0.7–4.0)
MCH: 28.4 pg (ref 26.0–34.0)
MCHC: 32.8 g/dL (ref 30.0–36.0)
MCV: 86.7 fL (ref 80.0–100.0)
Monocytes Absolute: 0.4 K/uL (ref 0.1–1.0)
Monocytes Relative: 15 %
Neutro Abs: 2 K/uL (ref 1.7–7.7)
Neutrophils Relative %: 69 %
Platelet Count: 129 K/uL — ABNORMAL LOW (ref 150–400)
RBC: 3.98 MIL/uL — ABNORMAL LOW (ref 4.22–5.81)
RDW: 20.7 % — ABNORMAL HIGH (ref 11.5–15.5)
WBC Count: 2.8 K/uL — ABNORMAL LOW (ref 4.0–10.5)
nRBC: 0 % (ref 0.0–0.2)

## 2024-03-12 LAB — SAMPLE TO BLOOD BANK

## 2024-03-12 MED ORDER — HYDROMORPHONE HCL 2 MG PO TABS
2.0000 mg | ORAL_TABLET | ORAL | 0 refills | Status: DC | PRN
  Filled 2024-03-12: qty 90, 15d supply, fill #0

## 2024-03-12 MED ORDER — CLONAZEPAM 0.5 MG PO TABS
0.5000 mg | ORAL_TABLET | Freq: Three times a day (TID) | ORAL | 1 refills | Status: DC | PRN
  Filled 2024-03-12: qty 60, 20d supply, fill #0
  Filled 2024-08-14: qty 60, 20d supply, fill #1
  Filled 2024-08-15: qty 60, 20d supply, fill #0

## 2024-03-12 MED ORDER — FENTANYL 75 MCG/HR TD PT72
1.0000 | MEDICATED_PATCH | TRANSDERMAL | 0 refills | Status: DC
  Filled 2024-03-12 (×2): qty 10, 30d supply, fill #0

## 2024-03-12 NOTE — Assessment & Plan Note (Addendum)
 Started darolutamide  600 mg twice daily. Will continue  continue relugolix  Will complete palliative radiation on 7/1 Interval response to treatment with PSA dropping

## 2024-03-12 NOTE — Telephone Encounter (Signed)
 Patient wife called and states that hospice referral has been placed\. Patient has gotten a DX of cancer and she wanted to make sure that Dr skeet would cont to send in refills for his migraine medication for him

## 2024-03-12 NOTE — Progress Notes (Signed)
 Genetic testing obtained today.  Patient will be transitioned to hospice.

## 2024-03-12 NOTE — Assessment & Plan Note (Addendum)
 Discussed goals of care. He has no improvement on his ability or quality of life. Wife and him are ready for hospice.   Spoke to palliative care, may place hospice referral.

## 2024-03-12 NOTE — Progress Notes (Signed)
 Palliative Medicine [facility]  [phone] [phone]   Name: Antonio Oliver Date: 03/12/2024 MRN: [id number]  DOB: Feb 11, 1956  Patient Care Team: Jama Chow, MD as PCP - General (Internal Medicine) Inocencio Soyla Lunger, MD as PCP - Electrophysiology (Cardiology) Pietro Redell RAMAN, MD as PCP - Cardiology (Cardiology) Pietro Redell RAMAN, MD as Consulting Physician (Cardiology) Skeet Juliene SAUNDERS, DO as Consulting Physician (Neurology) Vertell Pont, RN as Oncology Nurse Navigator Pickenpack-Cousar, Fannie SAILOR, NP as Nurse Practitioner [facility] and Palliative Medicine)    INTERVAL HISTORY: Antonio Oliver is a 68 y.o. male with oncologic medical history including metastatic prostate cancer (02/2024) with newly diffuse sclerotic bone metastasis. Currently undergoing palliative radiation with plans for triple therapy post radiation. Palliative is seeing patient for symptom management and goals of care.   SOCIAL HISTORY:     reports that he has never smoked. He has never used smokeless tobacco. He reports that he does not currently use alcohol. He reports that he does not use drugs.  ADVANCE DIRECTIVES:  On file. MOST completed  CODE STATUS: DNR  PAST MEDICAL HISTORY: Past Medical History:  Diagnosis Date   Anxiety and depression    Ascending aorta dilation (HCC) 03/06/2019   4.3 cm 2022   Benign prostatic hyperplasia without lower urinary tract symptoms 11/17/2016   Last Assessment & Plan:  Stable PSA today.  Formatting of this note might be different from the original. Last Assessment & Plan:  Stable PSA today. Last Assessment & Plan:  Formatting of this note might be different from the original. Stable PSA today.   BPH (benign prostatic hyperplasia)    Bradycardia 02/01/2019   Cervical myelopathy (HCC) 11/14/2018   Charcot's joint of foot 03/05/2019   Chronic back pain 04/03/2013   Chronic inflammatory arthritis 11/17/2016   History of positive  rheumatoid factor in the past. Denies placement on immunosuppressive therapy   Chronic pain syndrome    Failed back surgical syndrome.  Chronic neck pain.  Chronic low back pain with sciatica bilateral   Colon cancer screening    2023 cologuard neg   Complete heart block (HCC)    Following AVR s/p [employer] Jude PPM in 03/2017   DM2 (diabetes mellitus, type 2) (HCC) 04/03/2013   Last Assessment & Plan:  Formatting of this note might be different from the original. Diabetes is unchanged.  Continue current treatment regimen. Regular aerobic exercise. Diabetes will be reassessed in 3 months. Will check A1c today. Denies any problems with feet or sensation.   Elevated PSA 11/2021   Enthesopathy of ankle and tarsus 04/04/2013   Last Assessment & Plan:  Continues with swelling after prolonged standing.  Still  Wearing support hose, which helps.   Essential hypertension 10/30/2018   FHx: migraine headaches 04/04/2013   Fracture of capitate bone of wrist 02/26/2019   Fracture of tibia 03/05/2019   GERD (gastroesophageal reflux disease) 10/30/2018   Glaucoma 04/03/2013   Hemangioma of skin and subcutaneous tissue 07/07/2017   History of aortic valve replacement 2018   bicuspid AV   History of kidney stones 11/17/2016   History of sleep apnea    wt loss->gone   Hypothyroidism 10/30/2018   IDA (iron  deficiency anemia) 11/2021   malabs d/t gastric bipass   Lesion of plantar nerve 04/04/2013   Lumbar stenosis with neurogenic claudication 01/08/2021   Major depressive disorder, recurrent (HCC)    ECT in the remote past.  Patient disabled due to recurrent depression  and chronic pain.   Migraine syndrome    Mixed dyslipidemia 10/30/2018   Morbid obesity (HCC) 10/27/2011   Nondisplaced fracture of medial cuneiform of left foot, initial encounter for closed fracture 11/16/2018   Osteoarthritis 10/30/2018   Personal history of tobacco use, presenting hazards to health 04/04/2013   Presence of permanent  cardiac pacemaker    Severe single current episode of major depressive disorder, without psychotic features (HCC) 11/17/2016   Status post bariatric surgery 04/05/2011   bipass surg --presurg wt 310 lbs   Syncope 02/14/2019   Vitamin D  deficiency 10/30/2018    ALLERGIES:  is allergic to calcium  carbonate and ms contin [morphine].  MEDICATIONS:  Current Outpatient Medications  Medication Sig Dispense Refill   fentaNYL  (DURAGESIC ) 75 MCG/HR Place 1 patch onto the skin every 3 (three) days. 10 patch 0   HYDROmorphone  (DILAUDID ) 2 MG tablet Take 1 tablet (2 mg total) by mouth every 4 (four) hours as needed for severe pain (pain score 7-10). 90 tablet 0   aspirin  EC 81 MG tablet Take 81 mg by mouth daily.     baclofen (LIORESAL) 10 MG tablet Take 10 mg by mouth 3 (three) times daily.     buPROPion  (WELLBUTRIN  SR) 200 MG 12 hr tablet Take 200 mg by mouth 2 (two) times daily.     busPIRone  (BUSPAR ) 10 MG tablet Take 20 mg by mouth 2 (two) times daily.     Cholecalciferol  (VITAMIN D -3 PO) Take 5,000 capsules by mouth daily.     clobetasol (TEMOVATE) 0.05 % external solution Apply 1 Application topically as needed.     clonazePAM  (KLONOPIN ) 0.5 MG tablet Take 1 tablet (0.5 mg total) by mouth 3 (three) times daily as needed for anxiety. 60 tablet 1   dexamethasone  (DECADRON ) 4 MG tablet Take 1 tablet (4 mg total) by mouth 2 (two) times daily with a meal. 10 tablet 0   diclofenac Sodium (VOLTAREN) 1 % GEL Apply 1 application  topically as needed.     dicyclomine (BENTYL) 20 MG tablet Take 20 mg by mouth 4 (four) times daily as needed.     docusate sodium  (COLACE) 100 MG capsule Take 1 capsule (100 mg total) by mouth 2 (two) times daily. 10 capsule 0   DULoxetine  (CYMBALTA ) 60 MG capsule Take 60 mg by mouth 2 (two) times daily.      ferrous sulfate  325 (65 FE) MG EC tablet Take 325 mg by mouth daily.     finasteride  (PROSCAR ) 5 MG tablet Take 5 mg by mouth daily.     fluticasone  (FLONASE ) 50 MCG/ACT  nasal spray Place 2 sprays into the nose daily as needed for allergies or rhinitis.     furosemide  (LASIX ) 20 MG tablet Take 1 tablet (20 mg total) by mouth as needed (for leg swelling). 30 tablet 4   Galcanezumab -gnlm (EMGALITY ) 120 MG/ML SOAJ Inject 120 mg into the skin every 28 (twenty-eight) days. 1 mL 5   hydrALAZINE  (APRESOLINE ) 10 MG tablet Take 1 tablet BY MOUTH three times daily for systolic bp > 140 or diastolic bp > 90 729 tablet 3   insulin  lispro (HUMALOG) 100 UNIT/ML injection as directed Injection as needed     iron  sucrose 200 mg in sodium chloride  0.9 % 100 mL Inject 200 mg into the vein once a week.     levothyroxine  (SYNTHROID ) 125 MCG tablet Take 125 mcg by mouth daily.      lidocaine  (LIDODERM ) 5 % as needed.  lidocaine  (XYLOCAINE ) 2 % solution Use as directed 15 mLs in the mouth or throat as needed for mouth pain. Swish and split 200 mL 1   loratadine  (CLARITIN ) 10 MG tablet Take 10 mg by mouth daily as needed for allergies.     Magnesium  250 MG TABS 2 tablets (500mg ) Orally Twice a day     meclizine (ANTIVERT) 25 MG tablet Take 25 mg by mouth 3 (three) times daily as needed for dizziness.     mexiletine (MEXITIL) 250 MG capsule Take 1 capsule (250 mg total) by mouth 2 (two) times daily. 60 capsule 3   Multiple Vitamins-Minerals (PRESERVISION/LUTEIN) CAPS Take 1 capsule by mouth at bedtime.     mupirocin  ointment (BACTROBAN ) 2 % Apply 1 application topically 3 (three) times daily as needed (infection).     naloxone  (NARCAN ) nasal spray 4 mg/0.1 mL Instill or spray into the nose if you are experiencing shortness of breath or signs of opioid overdose. 1 each 0   omeprazole (PRILOSEC) 20 MG capsule Take 20 mg by mouth 2 (two) times daily before a meal.      polyethylene glycol (MIRALAX  / GLYCOLAX ) 17 g packet Take 17 g by mouth 2 (two) times daily.     potassium chloride  (KLOR-CON ) 10 MEQ tablet Take 10 mEq by mouth as needed (with lasix  dose).     pregabalin  (LYRICA ) 100 MG  capsule Take 100 mg by mouth 3 (three) times daily.     prochlorperazine  (COMPAZINE ) 5 MG tablet Take 1 tablet (5 mg total) by mouth every 6 (six) hours as needed for nausea or vomiting. 30 tablet 0   Semaglutide ,0.25 or 0.5MG /DOS, (OZEMPIC , 0.25 OR 0.5 MG/DOSE,) 2 MG/3ML SOPN Inject 0.5 mg into the skin once a week. 3 mL 3   silodosin (RAPAFLO) 8 MG CAPS capsule Take 8 mg by mouth daily.     solifenacin (VESICARE) 10 MG tablet Take 10 mg by mouth at bedtime.     Ubrogepant  (UBRELVY ) 100 MG TABS Take 1 tablet (100 mg total) by mouth as needed (May repeat in 2 hours.  Maximum 2 tablets in 24 hours.). 48 tablet 3   No current facility-administered medications for this visit.    VITAL SIGNS: There were no vitals taken for this visit. There were no vitals filed for this visit.  Estimated body mass index is 25.37 kg/m as calculated from the following:   Height as of 03/05/24: 6' 3 (1.905 m).   Weight as of 03/05/24: 203 lb (92.1 kg).   PERFORMANCE STATUS (ECOG) : 3 - Symptomatic, >50% confined to bed   Physical Exam General: Weak, ill appearing, in wheelchair  Cardiovascular: regular rate and rhythm Pulmonary: normal breathing pattern Extremities: no edema, no joint deformities Skin: no rashes Neurological: AAO x3  IMPRESSION: Discussed the use of AI scribe software for clinical note transcription with the patient, who gave verbal consent to proceed.  History of Present Illness Antonio Oliver is a 68 year old male with worsening metastatic prostate cancer and ongoing decline. Patient is in wheelchair. Weak appearing. He is accompanied by his wife. Patient and wife shares ongoing weakness and fatigue. Appetite has started to decline with concerns for swallowing. He reports significant weakness and has experienced falls, though less frequently, with two falls since the last visit. His wife notes increasing weakness and difficulty with mobility  He is experiencing severe throat pain and  swallowing difficulties, which have worsened over the past 3 to 5 days. He previously received  Botox for throat issues, which was discontinued due to swallowing paralysis. He uses viscous lidocaine  mixed with water  in a spray bottle for throat pain, which provides minimal relief. A recent 10-day course of fluconazole  did not alleviate his symptoms.   He has been experiencing severe pain, rated as 8.5 out of 10, which has been persistent and worsening. His history of back and neck issues has been exacerbated by falls in 2020 that resulted in a twisted leg and required additional spinal and neck surgeries. He has been on Oxycontin , previously every 12 hours, now adjusted to every 8 hours, but reports minimal relief. He also uses oxycodone  10 mg every 4 hours as needed, taking approximately four doses daily.   We discussed his pain at length in the setting of comfort focused care. Education provided on use of Fentanyl  patches for his pain given signs of dysphagia, increasing from 50 mcg to 75 mcg versus use of methadone. Will transition to hydromorphone  2mg  and discontinue use of oxycodone  to allow for better control. Education provided on use of both medication with hospice support.   He is currently taking Lyrica  100 mg three times a day, Cymbalta  60 mg twice a day, and has ondansetron  available for nausea, though he reports no recent nausea. He also takes clonazepam  as needed for anxiety, and finasteride  for prostate health. His wife manages his medication schedule due to his memory issues. We reviewed his medications at length and focused on minimizing daily medications ultimately eliminating medications not comfort and symptom focused.   Goals of Care Patient and wife met with Dr. Tina today and due to ongoing decline and worsening symptoms have made a difficult decision to focus on Antonio Oliver's comfort with outpatient hospice support. They are clear in expressed wishes that patient's quality of life is  most important. Wife is emotional expressing desires for patient to no longer suffer as it has been difficult to watch his rapid decline over the past 2-3 weeks.   I discussed at length hospice's goals and philosophy of care including what care would look like in the home. She shares he has an Mining engineer bed, electric and manual wheelchairs, and other mobility aids at home. Request lift and sliding pad for transfers. Education provided on referral process.   Antonio Oliver confirms DNR/DNI. Out of facility form completed. Education provided on MOST form. I completed a MOST form today. The patient and family outlined their wishes for the following treatment decisions:  Cardiopulmonary Resuscitation: Do Not Attempt Resuscitation (DNR/No CPR)  Medical Interventions: Comfort Measures: Keep clean, warm, and dry. Use medication by any route, positioning, wound care, and other measures to relieve pain and suffering. Use oxygen, suction and manual treatment of airway obstruction as needed for comfort. Do not transfer to the hospital unless comfort needs cannot be met in current location.  Antibiotics: Determine use of limitation of antibiotics when infection occurs  IV Fluids: No IV fluids (provide other measures to ensure comfort)  Feeding Tube: No feeding tube     Assessment & Plan Cancer related pain Pain exacerbated by falls, ineffective previous treatment with Oxycontin . Explored alternative pain management due to swallowing difficulties. Discussed hospice formulary limitations. Decided to increase fentanyl  patch dosage and switch breakthrough medication to Dilaudid . - Prescribe fentanyl  75 mcg patch for extended relief. - Switch breakthrough pain medication to Dilaudid  2-4 mg every 3-4 hours as needed. - Continue Lyrica  100 mg three times a day. - Continue Cymbalta  60 mg twice a day.  Dysphagia Recent onset of swallowing difficulties, possibly related to previous Botox treatment. Symptoms  worsened over the last 3-5 days. - Continue using viscous lidocaine  spray for throat pain relief.  Throat pain Severe throat pain affecting eating. Previous treatment ineffective due to taste. Current treatment provides minimal relief. - Continue using viscous lidocaine  spray for throat pain relief.  History of falls Falls led to significant injuries. Recent decrease in frequency. Discussed obtaining a lift for home mobility assistance. - Consider obtaining a lift for assistance with mobility at home.  Memory issues Memory issues impacting medication management. - Create a medication chart to assist with medication management.  Goals of Care Patient and wife have discussed with Dr. Tina and would like to focus on his quality of life for what time he has left in the home.  -Extensive goals of care discussions. Patient and wife are clear in their expressed wishes to focus on his comfort minimizing his pain and suffering for what time he has left.  -DNR/DNI, MOST form completed. Patient given original copy.  -Education provided on outpatient hospice and what care will look like in the home.  -Medications minimized for comfort.  -Referral placed to [facility]. Patient request lift and transfer pads.   Patient expressed understanding and was in agreement with this plan. He also understands that He can call the clinic at any time with any questions, concerns, or complaints.   Any controlled substances utilized were prescribed in the context of palliative care. PDMP has been reviewed.   Visit consisted of counseling and education dealing with the complex and emotionally intense issues of symptom management and palliative care in the setting of serious and potentially life-threatening illness.  Levon Borer, AGPCNP-BC  Palliative Medicine Team/[facility]

## 2024-03-12 NOTE — Telephone Encounter (Signed)
 RN called to check in with Mr. & Mrs. Lichtenwalner to see if they are going to come in to finish Mr. Broden's last radiation treatment this evening.  Mrs. Rotundo reports it hasn't helped and that Mr. Alegria condition has deteriorated since starting.  She said to cancel his final treatment that they do not wish to continue radiation.  RN will report conversation to team to make them aware of wishes.

## 2024-03-12 NOTE — Assessment & Plan Note (Deleted)
 Saw palliative care. Switched to OxyContin  20 mg every 12 hours. Continue oxycodone  10/325 mg PRN  Completing radiation

## 2024-03-13 ENCOUNTER — Other Ambulatory Visit: Payer: Self-pay

## 2024-03-13 ENCOUNTER — Ambulatory Visit

## 2024-03-13 NOTE — Progress Notes (Signed)
 Per provider notes from 03/12/24 patient transitioning to palliative care, disenrolling.

## 2024-03-13 NOTE — Radiation Completion Notes (Addendum)
  Radiation Oncology         [phone] ________________________________  Name: Antonio Oliver MRN: [id number]  Date: 03/12/2024  DOB: [birth year]  Referring Physician: PAT RUTH, M.D. Date of Service: 2024-03-13 Radiation Oncologist: Adina Barge, M.D. [facility]     RADIATION ONCOLOGY END OF TREATMENT NOTE     Diagnosis: 68 y.o. patient with painful rib, lumbar and thoracic spine metastases   Intent: Palliative     ==========DELIVERED PLANS==========  First Treatment Date: 2024-02-22 Last Treatment Date: 2024-03-05   Plan Name: Spine_L Site: Lumbar Spine Technique: 3D Mode: Photon Dose Per Fraction: 3 Gy Prescribed Dose (Delivered / Prescribed): 27 Gy / 30 Gy Prescribed Fxs (Delivered / Prescribed): 9 / 10   Plan Name: Chest_R_Rib Site: Ribs, Right Technique: Isodose Plan Mode: Photon Dose Per Fraction: 3 Gy Prescribed Dose (Delivered / Prescribed): 27 Gy / 30 Gy Prescribed Fxs (Delivered / Prescribed): 9 / 10   Plan Name: Spine_C-T Site: Cervical Spine Technique: 3D Mode: Photon Dose Per Fraction: 3 Gy Prescribed Dose (Delivered / Prescribed): 27 Gy / 30 Gy Prescribed Fxs (Delivered / Prescribed): 9 / 10     ==========ON TREATMENT VISIT DATES========== 2024-02-24, 2024-03-02    See weekly On Treatment Notes in Epic for details in the Media tab (listed as Progress notes on the On Treatment Visit Dates listed above).  The patient will receive a call in about one month from the radiation oncology department. Antonio Oliver will continue follow up with his medical oncologist, Dr. Tina, as well.  ------------------------------------------------   Donnice Barge, MD [facility]  Radiation Oncology Direct Dial: [phone]  Fax: [phone] [email or web address]  Skype  LinkedIn

## 2024-03-14 ENCOUNTER — Ambulatory Visit

## 2024-03-14 NOTE — Progress Notes (Deleted)
 HPI: Follow-up history of aortic valve replacement and thoracic aortic aneurysm. Patient has a history of bicuspid aortic valve and underwent aortic valve replacement March 10, 2017.  Procedure was complicated by complete heart block requiring pacemaker placement.  Note preoperative cardiac catheterization revealed no coronary disease. Nuclear study August 2022 showed no ischemia. Renal Dopplers May 2023 showed 1 to 59% right and no stenosis on the left.  Carotid Dopplers March 2024 near normal on the right and left.  CTA of thoracic aorta 4/25 showed 4.4 x 4 cm thoracic aortic aneurysm.  Monitor April 2025 showed sinus rhythm with occasional PACs and PVCs and multiple runs of ventricular tachycardia longest lasting 57 seconds.  Mexiletine was initiated by Dr. Inocencio.  Transesophageal echocardiogram April 2025 showed normal LV function, mild left ventricular hypertrophy, mild right ventricular enlargement, mild left atrial enlargement, moderate mitral regurgitation secondary to prolapse of the middle segment of the anterior mitral valve leaflet, thickened prosthetic valve leaflet (left cusp completely immobile with partially restricted mobility of right cusp); moderate aortic stenosis with mean gradient 23 mmHg and mildly dilated ascending aorta at 42 mm.  Since last seen   Current Outpatient Medications  Medication Sig Dispense Refill   aspirin  EC 81 MG tablet Take 81 mg by mouth daily.     baclofen (LIORESAL) 10 MG tablet Take 10 mg by mouth 3 (three) times daily.     buPROPion  (WELLBUTRIN  SR) 200 MG 12 hr tablet Take 200 mg by mouth 2 (two) times daily.     busPIRone  (BUSPAR ) 10 MG tablet Take 20 mg by mouth 2 (two) times daily.     Cholecalciferol  (VITAMIN D -3 PO) Take 5,000 capsules by mouth daily.     clobetasol (TEMOVATE) 0.05 % external solution Apply 1 Application topically as needed.     clonazePAM  (KLONOPIN ) 0.5 MG tablet Take 1 tablet (0.5 mg total) by mouth 3 (three) times daily as  needed for anxiety. 60 tablet 1   dexamethasone  (DECADRON ) 4 MG tablet Take 1 tablet (4 mg total) by mouth 2 (two) times daily with a meal. 10 tablet 0   diclofenac Sodium (VOLTAREN) 1 % GEL Apply 1 application  topically as needed.     dicyclomine (BENTYL) 20 MG tablet Take 20 mg by mouth 4 (four) times daily as needed.     docusate sodium  (COLACE) 100 MG capsule Take 1 capsule (100 mg total) by mouth 2 (two) times daily. 10 capsule 0   DULoxetine  (CYMBALTA ) 60 MG capsule Take 60 mg by mouth 2 (two) times daily.      fentaNYL  (DURAGESIC ) 75 MCG/HR Place 1 patch onto the skin every 3 (three) days. 10 patch 0   ferrous sulfate  325 (65 FE) MG EC tablet Take 325 mg by mouth daily.     finasteride  (PROSCAR ) 5 MG tablet Take 5 mg by mouth daily.     fluticasone  (FLONASE ) 50 MCG/ACT nasal spray Place 2 sprays into the nose daily as needed for allergies or rhinitis.     furosemide  (LASIX ) 20 MG tablet Take 1 tablet (20 mg total) by mouth as needed (for leg swelling). 30 tablet 4   Galcanezumab -gnlm (EMGALITY ) 120 MG/ML SOAJ Inject 120 mg into the skin every 28 (twenty-eight) days. 1 mL 5   hydrALAZINE  (APRESOLINE ) 10 MG tablet Take 1 tablet BY MOUTH three times daily for systolic bp > 140 or diastolic bp > 90 729 tablet 3   HYDROmorphone  (DILAUDID ) 2 MG tablet Take 1 tablet (2 mg  total) by mouth every 4 (four) hours as needed for severe pain (pain score 7-10). 90 tablet 0   insulin  lispro (HUMALOG) 100 UNIT/ML injection as directed Injection as needed     iron  sucrose 200 mg in sodium chloride  0.9 % 100 mL Inject 200 mg into the vein once a week.     levothyroxine  (SYNTHROID ) 125 MCG tablet Take 125 mcg by mouth daily.      lidocaine  (LIDODERM ) 5 % as needed.     lidocaine  (XYLOCAINE ) 2 % solution Use as directed 15 mLs in the mouth or throat as needed for mouth pain. Swish and split 200 mL 1   loratadine  (CLARITIN ) 10 MG tablet Take 10 mg by mouth daily as needed for allergies.     Magnesium  250 MG  TABS 2 tablets (500mg ) Orally Twice a day     meclizine (ANTIVERT) 25 MG tablet Take 25 mg by mouth 3 (three) times daily as needed for dizziness.     mexiletine (MEXITIL) 250 MG capsule Take 1 capsule (250 mg total) by mouth 2 (two) times daily. 60 capsule 3   Multiple Vitamins-Minerals (PRESERVISION/LUTEIN) CAPS Take 1 capsule by mouth at bedtime.     mupirocin  ointment (BACTROBAN ) 2 % Apply 1 application topically 3 (three) times daily as needed (infection).     naloxone  (NARCAN ) nasal spray 4 mg/0.1 mL Instill or spray into the nose if you are experiencing shortness of breath or signs of opioid overdose. 1 each 0   omeprazole (PRILOSEC) 20 MG capsule Take 20 mg by mouth 2 (two) times daily before a meal.      polyethylene glycol (MIRALAX  / GLYCOLAX ) 17 g packet Take 17 g by mouth 2 (two) times daily.     potassium chloride  (KLOR-CON ) 10 MEQ tablet Take 10 mEq by mouth as needed (with lasix  dose).     pregabalin  (LYRICA ) 100 MG capsule Take 100 mg by mouth 3 (three) times daily.     prochlorperazine  (COMPAZINE ) 5 MG tablet Take 1 tablet (5 mg total) by mouth every 6 (six) hours as needed for nausea or vomiting. 30 tablet 0   Semaglutide ,0.25 or 0.5MG /DOS, (OZEMPIC , 0.25 OR 0.5 MG/DOSE,) 2 MG/3ML SOPN Inject 0.5 mg into the skin once a week. 3 mL 3   silodosin (RAPAFLO) 8 MG CAPS capsule Take 8 mg by mouth daily.     solifenacin (VESICARE) 10 MG tablet Take 10 mg by mouth at bedtime.     Ubrogepant  (UBRELVY ) 100 MG TABS Take 1 tablet (100 mg total) by mouth as needed (May repeat in 2 hours.  Maximum 2 tablets in 24 hours.). 48 tablet 3   No current facility-administered medications for this visit.     Past Medical History:  Diagnosis Date   Anxiety and depression    Ascending aorta dilation (HCC) 03/06/2019   4.3 cm 2022   Benign prostatic hyperplasia without lower urinary tract symptoms 11/17/2016   Last Assessment & Plan:  Stable PSA today.  Formatting of this note might be different  from the original. Last Assessment & Plan:  Stable PSA today. Last Assessment & Plan:  Formatting of this note might be different from the original. Stable PSA today.   BPH (benign prostatic hyperplasia)    Bradycardia 02/01/2019   Cervical myelopathy (HCC) 11/14/2018   Charcot's joint of foot 03/05/2019   Chronic back pain 04/03/2013   Chronic inflammatory arthritis 11/17/2016   History of positive rheumatoid factor in the past. Denies placement on immunosuppressive therapy  Chronic pain syndrome    Failed back surgical syndrome.  Chronic neck pain.  Chronic low back pain with sciatica bilateral   Colon cancer screening    2023 cologuard neg   Complete heart block (HCC)    Following AVR s/p St Jude PPM in 03/2017   DM2 (diabetes mellitus, type 2) (HCC) 04/03/2013   Last Assessment & Plan:  Formatting of this note might be different from the original. Diabetes is unchanged.  Continue current treatment regimen. Regular aerobic exercise. Diabetes will be reassessed in 3 months. Will check A1c today. Denies any problems with feet or sensation.   Elevated PSA 11/2021   Enthesopathy of ankle and tarsus 04/04/2013   Last Assessment & Plan:  Continues with swelling after prolonged standing.  Still  Wearing support hose, which helps.   Essential hypertension 10/30/2018   FHx: migraine headaches 04/04/2013   Fracture of capitate bone of wrist 02/26/2019   Fracture of tibia 03/05/2019   GERD (gastroesophageal reflux disease) 10/30/2018   Glaucoma 04/03/2013   Hemangioma of skin and subcutaneous tissue 07/07/2017   History of aortic valve replacement 2018   bicuspid AV   History of kidney stones 11/17/2016   History of sleep apnea    wt loss->gone   Hypothyroidism 10/30/2018   IDA (iron  deficiency anemia) 11/2021   malabs d/t gastric bipass   Lesion of plantar nerve 04/04/2013   Lumbar stenosis with neurogenic claudication 01/08/2021   Major depressive disorder, recurrent (HCC)    ECT in  the remote past.  Patient disabled due to recurrent depression and chronic pain.   Migraine syndrome    Mixed dyslipidemia 10/30/2018   Morbid obesity (HCC) 10/27/2011   Nondisplaced fracture of medial cuneiform of left foot, initial encounter for closed fracture 11/16/2018   Osteoarthritis 10/30/2018   Personal history of tobacco use, presenting hazards to health 04/04/2013   Presence of permanent cardiac pacemaker    Severe single current episode of major depressive disorder, without psychotic features (HCC) 11/17/2016   Status post bariatric surgery 04/05/2011   bipass surg --presurg wt 310 lbs   Syncope 02/14/2019   Vitamin D  deficiency 10/30/2018    Past Surgical History:  Procedure Laterality Date   ANTERIOR CERVICAL DECOMP/DISCECTOMY FUSION N/A 11/17/2018   Procedure: CERVICAL THREE-CERVICAL FOUR, CERVICAL FOUR-CERVICAL FIVE, CERVICAL FIVE-CERVICAL SIX ANTERIOR CERVICAL DECOMPRESSION/DISCECTOMY FUSION;  Surgeon: Louis Shove, MD;  Location: [facility] OR;  Service: Neurosurgery;  Laterality: N/A;   AORTIC VALVE REPLACEMENT  03/2017   APPLICATION OF WOUND VAC Left 04/05/2019   Procedure: Application Of Wound Vac;  Surgeon: Elsa Lonni SAUNDERS, MD;  Location: [facility] OR;  Service: Orthopedics;  Laterality: Left;   BACK SURGERY     x4   CARDIOVASCULAR STRESS TEST  05/05/2021   lexiscan  neg   FASCIOTOMY Left 04/05/2019   Left leg 2 compartment fasciotomy   FASCIOTOMY Left 04/05/2019   Procedure: FASCIOTOMY LEFT LOWER LEG;  Surgeon: Elsa Lonni SAUNDERS, MD;  Location[facility] OR;  Service: Orthopedics;  Laterality: Left;   FOOT NEUROMA SURGERY     GASTRIC BYPASS  2012   HEMATOMA EVACUATION Left 04/05/2019   Procedure: Evacuation Hematoma Left Lower Leg;  Surgeon: Elsa Lonni SAUNDERS, MD;  Location: [facility] OR;  Service: Orthopedics;  Laterality: Left;   HEMORRHOID SURGERY     over 30 years ago   HERNIA REPAIR     LIH umb   I & D EXTREMITY Left 04/13/2022   Procedure: IRRIGATION AND DEBRIDEMENT OF  ELBOW AND BURSECTOMY;  Surgeon: Shari Easter, MD;  Location[facility] OR;  Service: Orthopedics;  Laterality: Left;   LUMBAR LAMINECTOMY/DECOMPRESSION MICRODISCECTOMY N/A 01/08/2021   Procedure: CENTRAL LUMBAR LAMINECTOMY LUMBAR TWO-THREE, LUMBAR THREE-FOUR;  Surgeon: Lucilla Lynwood BRAVO, MD;  Location: [facility] OR;  Service: Orthopedics;  Laterality: N/A;   NASAL SINUS SURGERY     x2   NECK SURGERY     OLECRANON BURSECTOMY  2023   d/t septic bursitis   TRANSESOPHAGEAL ECHOCARDIOGRAM (CATH LAB) N/A 01/02/2024   Procedure: TRANSESOPHAGEAL ECHOCARDIOGRAM;  Surgeon: Francyne Headland, MD;  Location: [facility] INVASIVE CV LAB;  Service: Cardiovascular;  Laterality: N/A;   TRANSTHORACIC ECHOCARDIOGRAM  12/2020   EF normal, mod MR, ascending AA (4.4 cm)    Social History   Socioeconomic History   Marital status: Married    Spouse name: susan    Number of children: 1   Years of education: Not on file   Highest education level: Bachelor's degree (e.g., BA, AB, BS)  Occupational History   Not on file  Tobacco Use   Smoking status: Never   Smokeless tobacco: Never  Vaping Use   Vaping status: Never Used  Substance and Sexual Activity   Alcohol use: Not Currently   Drug use: No   Sexual activity: Not on file  Other Topics Concern   Not on file  Social History Narrative   Patient lives at home with spouse [name]   Educ: BS   Occup: retired Banker.  Disabled due to depression and pain.   Tobacco: None   Alc: none   Right handed   Social Drivers of Corporate investment banker Strain: Low Risk  (05/30/2019)   Received from [facility]   Overall Financial Resource Strain (CARDIA)    Difficulty of Paying Living Expenses: Not on file  Food Insecurity: No Food Insecurity (02/21/2024)   Hunger Vital Sign    Worried About Running Out of Food in the Last Year: Never true    Ran Out of Food in the Last Year: Never true  Transportation Needs: No Transportation Needs (02/21/2024)   PRAPARE -  Administrator, Civil Service (Medical): No    Lack of Transportation (Non-Medical): No  Physical Activity: Not on file  Stress: Not on file  Social Connections: Unknown (01/06/2023)   [facility]   Social Network    Social Network: Not on file  Intimate Partner Violence: Not At Risk (02/21/2024)   Humiliation, Afraid, Rape, and Kick questionnaire    Fear of Current or Ex-Partner: No    Emotionally Abused: No    Physically Abused: No    Sexually Abused: No    Family History  Problem Relation Age of Onset   Cancer Mother        breast, likely diagnosed less than 70 years of age.   Rheum arthritis Mother    Diabetes Mother    Heart disease Father    Kidney disease Father    Diabetes Father    Hypertension Brother    Diabetes Brother    Osteoarthritis Brother    Heart disease Brother    Hypertension Brother    Diabetes Brother    Rheum arthritis Brother    Depression Daughter    Anxiety disorder Daughter     ROS: no fevers or chills, productive cough, hemoptysis, dysphasia, odynophagia, melena, hematochezia, dysuria, hematuria, rash, seizure activity, orthopnea, PND, pedal edema, claudication. Remaining systems are negative.  Physical Exam: Well-developed well-nourished in no acute  distress.  Skin is warm and dry.  HEENT is normal.  Neck is supple.  Chest is clear to auscultation with normal expansion.  Cardiovascular exam is regular rate and rhythm.  Abdominal exam nontender or distended. No masses palpated. Extremities show no edema. neuro grossly intact  ECG- personally reviewed  A/P  1 status post aortic valve replacement-continue SBE prophylaxis.  Follow-up transesophageal echocardiogram showed prosthetic valve dysfunction with restricted leaflets and moderate aortic stenosis.  Will plan follow-up echocardiogram April 2026 or sooner if he develops symptoms.  2 thoracic aortic aneurysm-patient will need follow-up CTA April 2026.  3  mitral digitation-moderate on most recent transesophageal echocardiogram.  Follow-up echocardiogram April 2026.  4 autonomic dysfunction-continuing to be evaluated by neurology.  5 hyperlipidemia-continue statin.  6 PVCs/nonsustained ventricular tachycardia-continue mexiletine which was initiated by Dr. Inocencio.  7 history of palpitations-beta-blocker previously discontinued secondary to hypotension.  8 pacemaker-managed by Dr. Inocencio.  Redell Shallow, MD

## 2024-03-15 ENCOUNTER — Ambulatory Visit

## 2024-03-15 ENCOUNTER — Ambulatory Visit: Payer: Medicare PPO | Admitting: Neurology

## 2024-03-15 ENCOUNTER — Inpatient Hospital Stay

## 2024-03-15 ENCOUNTER — Other Ambulatory Visit: Payer: Self-pay

## 2024-03-15 ENCOUNTER — Other Ambulatory Visit (HOSPITAL_COMMUNITY): Payer: Self-pay

## 2024-03-15 MED ORDER — UBRELVY 100 MG PO TABS
1.0000 | ORAL_TABLET | ORAL | 3 refills | Status: DC | PRN
  Filled 2024-03-15 – 2024-05-29 (×4): qty 48, 90d supply, fill #0

## 2024-03-15 MED ORDER — EMGALITY 120 MG/ML ~~LOC~~ SOAJ
120.0000 mg | SUBCUTANEOUS | 5 refills | Status: DC
  Filled 2024-03-15 – 2024-04-04 (×2): qty 1, 28d supply, fill #0
  Filled 2024-05-03: qty 1, 28d supply, fill #1
  Filled 2024-05-29: qty 1, 28d supply, fill #2
  Filled 2024-07-18: qty 1, 28d supply, fill #3
  Filled 2024-08-14 – 2024-08-15 (×2): qty 1, 28d supply, fill #4
  Filled 2024-08-21 – 2024-09-05 (×2): qty 1, 28d supply, fill #5

## 2024-03-15 NOTE — Telephone Encounter (Signed)
 PER Dr.Jaffe, I can refill. He would still need to follow up in some capacity once a year. I would like to put on the books a video visit for February.

## 2024-03-15 NOTE — Progress Notes (Signed)
 Confirmed Patient is in Hospice and disenrolled

## 2024-03-16 ENCOUNTER — Other Ambulatory Visit (HOSPITAL_COMMUNITY): Payer: Self-pay

## 2024-03-16 MED ORDER — TRAZODONE HCL 50 MG PO TABS
25.0000 mg | ORAL_TABLET | Freq: Every day | ORAL | 0 refills | Status: DC
  Filled 2024-03-16: qty 15, 30d supply, fill #0

## 2024-03-16 MED ORDER — HYDROMORPHONE HCL 2 MG PO TABS
6.0000 mg | ORAL_TABLET | ORAL | 0 refills | Status: DC | PRN
  Filled 2024-03-16: qty 30, 2d supply, fill #0

## 2024-03-16 MED ORDER — FENTANYL 100 MCG/HR TD PT72
1.0000 | MEDICATED_PATCH | TRANSDERMAL | 0 refills | Status: DC
  Filled 2024-03-16: qty 5, 15d supply, fill #0

## 2024-03-17 ENCOUNTER — Other Ambulatory Visit (HOSPITAL_COMMUNITY): Payer: Self-pay

## 2024-03-19 ENCOUNTER — Other Ambulatory Visit (HOSPITAL_COMMUNITY): Payer: Self-pay

## 2024-03-19 MED ORDER — HYDROMORPHONE HCL 2 MG PO TABS
6.0000 mg | ORAL_TABLET | ORAL | 0 refills | Status: DC | PRN
  Filled 2024-03-19: qty 60, 4d supply, fill #0

## 2024-03-20 ENCOUNTER — Other Ambulatory Visit (HOSPITAL_COMMUNITY): Payer: Self-pay

## 2024-03-21 ENCOUNTER — Other Ambulatory Visit (HOSPITAL_COMMUNITY): Payer: Self-pay

## 2024-03-21 MED ORDER — DEXAMETHASONE 4 MG PO TABS
4.0000 mg | ORAL_TABLET | Freq: Two times a day (BID) | ORAL | 0 refills | Status: DC
  Filled 2024-03-21: qty 30, 15d supply, fill #0

## 2024-03-21 MED ORDER — FENTANYL 50 MCG/HR TD PT72
1.0000 | MEDICATED_PATCH | TRANSDERMAL | 0 refills | Status: DC
  Filled 2024-03-21: qty 5, 15d supply, fill #0

## 2024-03-23 ENCOUNTER — Other Ambulatory Visit (HOSPITAL_COMMUNITY): Payer: Self-pay

## 2024-03-23 MED ORDER — HYDROMORPHONE HCL 2 MG PO TABS
6.0000 mg | ORAL_TABLET | ORAL | 0 refills | Status: DC | PRN
  Filled 2024-03-23: qty 60, 4d supply, fill #0

## 2024-03-24 ENCOUNTER — Other Ambulatory Visit (HOSPITAL_COMMUNITY): Payer: Self-pay

## 2024-03-27 ENCOUNTER — Ambulatory Visit: Admitting: Cardiology

## 2024-04-04 ENCOUNTER — Other Ambulatory Visit: Payer: Self-pay

## 2024-04-04 ENCOUNTER — Other Ambulatory Visit (HOSPITAL_COMMUNITY): Payer: Self-pay

## 2024-04-04 DIAGNOSIS — K1379 Other lesions of oral mucosa: Secondary | ICD-10-CM

## 2024-04-04 DIAGNOSIS — R112 Nausea with vomiting, unspecified: Secondary | ICD-10-CM

## 2024-04-05 ENCOUNTER — Other Ambulatory Visit: Payer: Self-pay

## 2024-04-10 ENCOUNTER — Other Ambulatory Visit (HOSPITAL_COMMUNITY): Payer: Self-pay

## 2024-04-10 ENCOUNTER — Ambulatory Visit
Admission: RE | Admit: 2024-04-10 | Discharge: 2024-04-10 | Disposition: A | Discharge status: Home / Self Care | Source: Ambulatory Visit

## 2024-04-10 DIAGNOSIS — Z510 Encounter for antineoplastic radiation therapy: Secondary | ICD-10-CM | POA: Insufficient documentation

## 2024-04-10 DIAGNOSIS — D508 Other iron deficiency anemias: Secondary | ICD-10-CM | POA: Insufficient documentation

## 2024-04-10 DIAGNOSIS — Z9884 Bariatric surgery status: Secondary | ICD-10-CM | POA: Insufficient documentation

## 2024-04-10 DIAGNOSIS — C61 Malignant neoplasm of prostate: Secondary | ICD-10-CM | POA: Insufficient documentation

## 2024-04-10 DIAGNOSIS — G893 Neoplasm related pain (acute) (chronic): Secondary | ICD-10-CM | POA: Insufficient documentation

## 2024-04-10 DIAGNOSIS — K5900 Constipation, unspecified: Secondary | ICD-10-CM | POA: Insufficient documentation

## 2024-04-10 DIAGNOSIS — Z8679 Personal history of other diseases of the circulatory system: Secondary | ICD-10-CM | POA: Insufficient documentation

## 2024-04-10 DIAGNOSIS — N200 Calculus of kidney: Secondary | ICD-10-CM | POA: Insufficient documentation

## 2024-04-10 DIAGNOSIS — E669 Obesity, unspecified: Secondary | ICD-10-CM | POA: Insufficient documentation

## 2024-04-10 DIAGNOSIS — Z8719 Personal history of other diseases of the digestive system: Secondary | ICD-10-CM | POA: Insufficient documentation

## 2024-04-10 DIAGNOSIS — Z79899 Other long term (current) drug therapy: Secondary | ICD-10-CM | POA: Insufficient documentation

## 2024-04-10 DIAGNOSIS — E785 Hyperlipidemia, unspecified: Secondary | ICD-10-CM | POA: Insufficient documentation

## 2024-04-10 DIAGNOSIS — C7951 Secondary malignant neoplasm of bone: Secondary | ICD-10-CM | POA: Insufficient documentation

## 2024-04-10 DIAGNOSIS — I10 Essential (primary) hypertension: Secondary | ICD-10-CM | POA: Insufficient documentation

## 2024-04-10 DIAGNOSIS — Z86018 Personal history of other benign neoplasm: Secondary | ICD-10-CM | POA: Insufficient documentation

## 2024-04-10 DIAGNOSIS — E782 Mixed hyperlipidemia: Secondary | ICD-10-CM | POA: Insufficient documentation

## 2024-04-10 DIAGNOSIS — M1990 Unspecified osteoarthritis, unspecified site: Secondary | ICD-10-CM | POA: Insufficient documentation

## 2024-04-10 DIAGNOSIS — Z803 Family history of malignant neoplasm of breast: Secondary | ICD-10-CM | POA: Insufficient documentation

## 2024-04-10 DIAGNOSIS — I700 Atherosclerosis of aorta: Secondary | ICD-10-CM | POA: Insufficient documentation

## 2024-04-10 DIAGNOSIS — Z923 Personal history of irradiation: Secondary | ICD-10-CM | POA: Insufficient documentation

## 2024-04-10 DIAGNOSIS — Z5982 Transportation insecurity: Secondary | ICD-10-CM | POA: Insufficient documentation

## 2024-04-10 NOTE — Progress Notes (Signed)
  Radiation Oncology         [phone] ________________________________  Name: Antonio Oliver MRN: [id number]  Date of Service: 04/10/2024  DOB: 1956/03/12  Post Treatment Telephone Note  Diagnosis: Secondary malignant neoplasm of bone   Last Treatment Date: 2024-03-05  The patients wife was available for call today.  Per patients wife [name], patient is now under hospice care and pain is now under control.  No further follow up needed at this time per wife.   The patient is not scheduled for ongoing care with Dr. Tina in medical oncology. The patient was encouraged to call if he  develops concerns or questions regarding radiation.

## 2024-04-13 ENCOUNTER — Encounter: Admitting: Physician Assistant

## 2024-04-16 ENCOUNTER — Ambulatory Visit: Payer: Medicare PPO

## 2024-04-17 ENCOUNTER — Encounter: Payer: Self-pay | Admitting: Genetic Counselor

## 2024-04-17 ENCOUNTER — Telehealth: Payer: Self-pay | Admitting: Genetic Counselor

## 2024-04-17 NOTE — Telephone Encounter (Signed)
 I contacted [name] to discuss his genetic testing results. No pathogenic variants were identified in the 77 genes analyzed.   The test report has been scanned into EPIC and is located under the Molecular Pathology section of the Results Review tab.  A portion of the result report is included below for reference.   Antonio Rhinehart, MS, [facility] Genetic Counselor [email or web address] (P) [phone]

## 2024-04-17 NOTE — Telephone Encounter (Signed)
 I contacted Mr. Alkhatib to discuss his genetic testing results. No pathogenic variants were identified in the 77 genes analyzed.   The test report has been scanned into EPIC and is located under the Molecular Pathology section of the Results Review tab.  A portion of the result report is included below for reference.   Albirta Rhinehart, MS, [facility] Genetic Counselor [email or web address] (P) [phone]

## 2024-04-19 ENCOUNTER — Other Ambulatory Visit (HOSPITAL_COMMUNITY): Payer: Self-pay

## 2024-04-20 ENCOUNTER — Other Ambulatory Visit (HOSPITAL_COMMUNITY): Payer: Self-pay

## 2024-05-03 ENCOUNTER — Other Ambulatory Visit (HOSPITAL_COMMUNITY): Payer: Self-pay

## 2024-05-03 ENCOUNTER — Other Ambulatory Visit: Payer: Self-pay

## 2024-05-04 ENCOUNTER — Other Ambulatory Visit (HOSPITAL_COMMUNITY): Payer: Self-pay

## 2024-05-22 ENCOUNTER — Other Ambulatory Visit: Payer: Self-pay | Admitting: Cardiology

## 2024-05-23 MED ORDER — MEXILETINE HCL 250 MG PO CAPS: 250.0000 mg | ORAL_CAPSULE | Freq: Two times a day (BID) | ORAL | 2 refills | Status: DC

## 2024-05-29 ENCOUNTER — Encounter (HOSPITAL_COMMUNITY): Payer: Self-pay

## 2024-05-29 ENCOUNTER — Other Ambulatory Visit (HOSPITAL_COMMUNITY): Payer: Self-pay

## 2024-05-30 ENCOUNTER — Other Ambulatory Visit (HOSPITAL_COMMUNITY): Payer: Self-pay

## 2024-05-30 ENCOUNTER — Other Ambulatory Visit: Payer: Self-pay

## 2024-06-06 ENCOUNTER — Ambulatory Visit (INDEPENDENT_AMBULATORY_CARE_PROVIDER_SITE_OTHER)

## 2024-06-06 DIAGNOSIS — I442 Atrioventricular block, complete: Secondary | ICD-10-CM | POA: Diagnosis not present

## 2024-06-11 LAB — CUP PACEART REMOTE DEVICE CHECK
Battery Remaining Longevity: 44 mo
Battery Remaining Percentage: 38 %
Battery Voltage: 2.98 V
Brady Statistic AP VP Percent: 4.3 %
Brady Statistic AP VS Percent: 1.7 %
Brady Statistic AS VP Percent: 12 %
Brady Statistic AS VS Percent: 79 %
Brady Statistic RA Percent Paced: 1.7 %
Brady Statistic RV Percent Paced: 17 %
Date Time Interrogation Session: 20251003103634
Implantable Lead Connection Status: 753985
Implantable Lead Connection Status: 753985
Implantable Lead Implant Date: 20180710
Implantable Lead Implant Date: 20180710
Implantable Lead Location: 753859
Implantable Lead Location: 753860
Implantable Pulse Generator Implant Date: 20180710
Lead Channel Impedance Value: 410 Ohm
Lead Channel Impedance Value: 480 Ohm
Lead Channel Pacing Threshold Amplitude: 0.5 V
Lead Channel Pacing Threshold Amplitude: 1 V
Lead Channel Pacing Threshold Pulse Width: 0.4 ms
Lead Channel Pacing Threshold Pulse Width: 0.4 ms
Lead Channel Sensing Intrinsic Amplitude: 0.7 mV
Lead Channel Sensing Intrinsic Amplitude: 2.8 mV
Lead Channel Setting Pacing Amplitude: 1.25 V
Lead Channel Setting Pacing Amplitude: 2 V
Lead Channel Setting Pacing Pulse Width: 0.4 ms
Lead Channel Setting Sensing Sensitivity: 0.5 mV
Pulse Gen Model: 2272
Pulse Gen Serial Number: 8912213

## 2024-06-12 NOTE — Progress Notes (Signed)
 Remote PPM Transmission

## 2024-06-17 ENCOUNTER — Ambulatory Visit: Payer: Self-pay | Admitting: Cardiology

## 2024-06-19 ENCOUNTER — Other Ambulatory Visit: Payer: Self-pay

## 2024-06-19 ENCOUNTER — Other Ambulatory Visit (HOSPITAL_COMMUNITY): Payer: Self-pay

## 2024-06-19 DIAGNOSIS — K1379 Other lesions of oral mucosa: Secondary | ICD-10-CM

## 2024-06-20 ENCOUNTER — Other Ambulatory Visit (HOSPITAL_COMMUNITY): Payer: Self-pay

## 2024-06-20 MED ORDER — LIDOCAINE VISCOUS HCL 2 % MT SOLN
15.0000 mL | OROMUCOSAL | 1 refills | Status: DC | PRN
  Filled 2024-06-20: qty 200, 5d supply, fill #0
  Filled 2024-08-14: qty 200, 5d supply, fill #1
  Filled 2024-08-15: qty 200, 5d supply, fill #0

## 2024-06-26 ENCOUNTER — Other Ambulatory Visit (HOSPITAL_COMMUNITY): Payer: Self-pay

## 2024-07-04 NOTE — Progress Notes (Signed)
 Remote pacemaker transmission.

## 2024-07-04 NOTE — Addendum Note (Signed)
 Addended by: VICCI SELLER A on: 07/04/2024 09:23 AM   Modules accepted: Orders, Level of Service

## 2024-07-16 ENCOUNTER — Ambulatory Visit: Payer: Medicare PPO

## 2024-07-18 ENCOUNTER — Other Ambulatory Visit (HOSPITAL_COMMUNITY): Payer: Self-pay

## 2024-07-19 ENCOUNTER — Other Ambulatory Visit (HOSPITAL_COMMUNITY): Payer: Self-pay

## 2024-07-19 ENCOUNTER — Encounter: Payer: Self-pay | Admitting: Pharmacist

## 2024-07-19 ENCOUNTER — Other Ambulatory Visit: Payer: Self-pay

## 2024-07-25 ENCOUNTER — Encounter: Payer: Self-pay | Admitting: Oncology

## 2024-07-30 ENCOUNTER — Other Ambulatory Visit (HOSPITAL_COMMUNITY): Payer: Self-pay

## 2024-08-13 ENCOUNTER — Other Ambulatory Visit (HOSPITAL_COMMUNITY): Payer: Self-pay

## 2024-08-13 MED ORDER — OZEMPIC (0.25 OR 0.5 MG/DOSE) 2 MG/3ML ~~LOC~~ SOPN
0.5000 mg | PEN_INJECTOR | SUBCUTANEOUS | 0 refills | Status: DC
  Filled 2024-08-13: qty 3, 28d supply, fill #0

## 2024-08-15 ENCOUNTER — Other Ambulatory Visit: Payer: Self-pay

## 2024-08-15 ENCOUNTER — Other Ambulatory Visit (HOSPITAL_COMMUNITY): Payer: Self-pay

## 2024-08-21 ENCOUNTER — Other Ambulatory Visit (HOSPITAL_COMMUNITY): Payer: Self-pay

## 2024-09-03 ENCOUNTER — Encounter: Payer: Self-pay | Admitting: *Deleted

## 2024-09-05 ENCOUNTER — Ambulatory Visit

## 2024-09-05 DIAGNOSIS — I442 Atrioventricular block, complete: Secondary | ICD-10-CM

## 2024-09-06 LAB — CUP PACEART REMOTE DEVICE CHECK
Battery Remaining Longevity: 43 mo
Battery Remaining Percentage: 36 %
Battery Voltage: 2.98 V
Brady Statistic AP VP Percent: 3.3 %
Brady Statistic AP VS Percent: 1.3 %
Brady Statistic AS VP Percent: 12 %
Brady Statistic AS VS Percent: 82 %
Brady Statistic RA Percent Paced: 1.3 %
Brady Statistic RV Percent Paced: 15 %
Date Time Interrogation Session: 20251231025234
Implantable Lead Connection Status: 753985
Implantable Lead Connection Status: 753985
Implantable Lead Implant Date: 20180710
Implantable Lead Implant Date: 20180710
Implantable Lead Location: 753859
Implantable Lead Location: 753860
Implantable Pulse Generator Implant Date: 20180710
Lead Channel Impedance Value: 450 Ohm
Lead Channel Impedance Value: 480 Ohm
Lead Channel Pacing Threshold Amplitude: 0.5 V
Lead Channel Pacing Threshold Amplitude: 0.875 V
Lead Channel Pacing Threshold Pulse Width: 0.4 ms
Lead Channel Pacing Threshold Pulse Width: 0.4 ms
Lead Channel Sensing Intrinsic Amplitude: 0.7 mV
Lead Channel Sensing Intrinsic Amplitude: 3.3 mV
Lead Channel Setting Pacing Amplitude: 1.125
Lead Channel Setting Pacing Amplitude: 2 V
Lead Channel Setting Pacing Pulse Width: 0.4 ms
Lead Channel Setting Sensing Sensitivity: 0.5 mV
Pulse Gen Model: 2272
Pulse Gen Serial Number: 8912213

## 2024-09-08 ENCOUNTER — Ambulatory Visit: Payer: Self-pay | Admitting: Cardiology

## 2024-09-10 NOTE — Progress Notes (Signed)
 Remote PPM Transmission

## 2024-10-04 ENCOUNTER — Other Ambulatory Visit (HOSPITAL_COMMUNITY): Payer: Self-pay

## 2024-10-07 DEATH — deceased

## 2024-10-10 ENCOUNTER — Telehealth: Admitting: Neurology

## 2024-10-15 ENCOUNTER — Ambulatory Visit: Payer: Medicare PPO

## 2024-12-05 ENCOUNTER — Ambulatory Visit
